# Patient Record
Sex: Female | Born: 1945
Health system: Southern US, Community
[De-identification: ages and names within clinical notes are randomized; demographics above are authoritative.]

## PROBLEM LIST (undated history)

## (undated) DIAGNOSIS — G459 Transient cerebral ischemic attack, unspecified: Secondary | ICD-10-CM

## (undated) DIAGNOSIS — F3289 Other specified depressive episodes: Secondary | ICD-10-CM

## (undated) DIAGNOSIS — M545 Low back pain, unspecified: Secondary | ICD-10-CM

## (undated) DIAGNOSIS — H269 Unspecified cataract: Secondary | ICD-10-CM

## (undated) DIAGNOSIS — I2510 Atherosclerotic heart disease of native coronary artery without angina pectoris: Secondary | ICD-10-CM

## (undated) DIAGNOSIS — I639 Cerebral infarction, unspecified: Secondary | ICD-10-CM

## (undated) DIAGNOSIS — Z5189 Encounter for other specified aftercare: Secondary | ICD-10-CM

## (undated) DIAGNOSIS — I6529 Occlusion and stenosis of unspecified carotid artery: Secondary | ICD-10-CM

## (undated) DIAGNOSIS — D649 Anemia, unspecified: Secondary | ICD-10-CM

## (undated) DIAGNOSIS — K219 Gastro-esophageal reflux disease without esophagitis: Secondary | ICD-10-CM

## (undated) DIAGNOSIS — M8580 Other specified disorders of bone density and structure, unspecified site: Secondary | ICD-10-CM

## (undated) DIAGNOSIS — H9190 Unspecified hearing loss, unspecified ear: Secondary | ICD-10-CM

## (undated) DIAGNOSIS — R609 Edema, unspecified: Secondary | ICD-10-CM

## (undated) DIAGNOSIS — E669 Obesity, unspecified: Secondary | ICD-10-CM

## (undated) DIAGNOSIS — I739 Peripheral vascular disease, unspecified: Secondary | ICD-10-CM

## (undated) DIAGNOSIS — M1990 Unspecified osteoarthritis, unspecified site: Secondary | ICD-10-CM

## (undated) DIAGNOSIS — R011 Cardiac murmur, unspecified: Secondary | ICD-10-CM

## (undated) DIAGNOSIS — R51 Headache: Secondary | ICD-10-CM

## (undated) DIAGNOSIS — K3189 Other diseases of stomach and duodenum: Secondary | ICD-10-CM

## (undated) DIAGNOSIS — M797 Fibromyalgia: Secondary | ICD-10-CM

## (undated) DIAGNOSIS — N183 Chronic kidney disease, stage 3 unspecified: Secondary | ICD-10-CM

## (undated) DIAGNOSIS — A609 Anogenital herpesviral infection, unspecified: Secondary | ICD-10-CM

## (undated) DIAGNOSIS — R9439 Abnormal result of other cardiovascular function study: Secondary | ICD-10-CM

## (undated) DIAGNOSIS — K317 Polyp of stomach and duodenum: Secondary | ICD-10-CM

## (undated) DIAGNOSIS — F329 Major depressive disorder, single episode, unspecified: Secondary | ICD-10-CM

## (undated) DIAGNOSIS — T7840XA Allergy, unspecified, initial encounter: Secondary | ICD-10-CM

## (undated) DIAGNOSIS — I10 Essential (primary) hypertension: Secondary | ICD-10-CM

## (undated) DIAGNOSIS — E785 Hyperlipidemia, unspecified: Secondary | ICD-10-CM

## (undated) DIAGNOSIS — E119 Type 2 diabetes mellitus without complications: Secondary | ICD-10-CM

## (undated) HISTORY — DX: Chronic kidney disease, stage 3 unspecified: N18.30

## (undated) HISTORY — DX: Gastro-esophageal reflux disease without esophagitis: K21.9

## (undated) HISTORY — PX: CORONARY STENT PLACEMENT: SHX1402

## (undated) HISTORY — DX: Encounter for other specified aftercare: Z51.89

## (undated) HISTORY — DX: Other specified depressive episodes: F32.89

## (undated) HISTORY — DX: Edema, unspecified: R60.9

## (undated) HISTORY — DX: Polyp of stomach and duodenum: K31.7

## (undated) HISTORY — DX: Major depressive disorder, single episode, unspecified: F32.9

## (undated) HISTORY — DX: Unspecified osteoarthritis, unspecified site: M19.90

## (undated) HISTORY — PX: CARDIAC CATHETERIZATION: SHX172

## (undated) HISTORY — PX: CARPAL TUNNEL RELEASE: SHX101

## (undated) HISTORY — DX: Cardiac murmur, unspecified: R01.1

## (undated) HISTORY — DX: Other diseases of stomach and duodenum: K31.89

## (undated) HISTORY — DX: Anemia, unspecified: D64.9

## (undated) HISTORY — DX: Obesity, unspecified: E66.9

## (undated) HISTORY — DX: Atherosclerotic heart disease of native coronary artery without angina pectoris: I25.10

## (undated) HISTORY — DX: Hyperlipidemia, unspecified: E78.5

## (undated) HISTORY — DX: Low back pain: M54.5

## (undated) HISTORY — DX: Peripheral vascular disease, unspecified: I73.9

## (undated) HISTORY — DX: Type 2 diabetes mellitus without complications: E11.9

## (undated) HISTORY — DX: Unspecified cataract: H26.9

## (undated) HISTORY — DX: Allergy, unspecified, initial encounter: T78.40XA

## (undated) HISTORY — DX: Transient cerebral ischemic attack, unspecified: G45.9

## (undated) HISTORY — PX: POLYPECTOMY: SHX149

## (undated) HISTORY — DX: Low back pain, unspecified: M54.50

## (undated) HISTORY — DX: Occlusion and stenosis of unspecified carotid artery: I65.29

## (undated) HISTORY — DX: Unspecified hearing loss, unspecified ear: H91.90

## (undated) HISTORY — PX: ABDOMINAL HYSTERECTOMY: SHX81

## (undated) HISTORY — DX: Cerebral infarction, unspecified: I63.9

## (undated) HISTORY — DX: Anogenital herpesviral infection, unspecified: A60.9

## (undated) HISTORY — DX: Other specified disorders of bone density and structure, unspecified site: M85.80

## (undated) HISTORY — PX: DILATION AND CURETTAGE OF UTERUS: SHX78

## (undated) HISTORY — PX: GLAUCOMA SURGERY: SHX656

## (undated) HISTORY — DX: Essential (primary) hypertension: I10

## (undated) HISTORY — DX: Headache: R51

## (undated) HISTORY — PX: CHOLECYSTECTOMY: SHX55

---

## 1998-09-21 ENCOUNTER — Emergency Department (HOSPITAL_COMMUNITY): Admission: EM | Admit: 1998-09-21 | Discharge: 1998-09-21 | Payer: Self-pay | Admitting: Emergency Medicine

## 1999-09-28 ENCOUNTER — Emergency Department (HOSPITAL_COMMUNITY): Admission: EM | Admit: 1999-09-28 | Discharge: 1999-09-28 | Payer: Self-pay | Admitting: Emergency Medicine

## 1999-10-16 ENCOUNTER — Other Ambulatory Visit: Admission: RE | Admit: 1999-10-16 | Discharge: 1999-10-16 | Payer: Self-pay | Admitting: Obstetrics and Gynecology

## 1999-10-30 ENCOUNTER — Other Ambulatory Visit: Admission: RE | Admit: 1999-10-30 | Discharge: 1999-10-30 | Payer: Self-pay | Admitting: Otolaryngology

## 1999-12-28 ENCOUNTER — Other Ambulatory Visit: Admission: RE | Admit: 1999-12-28 | Discharge: 1999-12-28 | Payer: Self-pay | Admitting: Gastroenterology

## 2000-02-13 ENCOUNTER — Encounter: Admission: RE | Admit: 2000-02-13 | Discharge: 2000-05-13 | Payer: Self-pay | Admitting: Endocrinology

## 2000-02-26 ENCOUNTER — Other Ambulatory Visit: Admission: RE | Admit: 2000-02-26 | Discharge: 2000-02-26 | Payer: Self-pay

## 2000-02-26 ENCOUNTER — Encounter (INDEPENDENT_AMBULATORY_CARE_PROVIDER_SITE_OTHER): Payer: Self-pay | Admitting: Specialist

## 2002-11-30 ENCOUNTER — Emergency Department (HOSPITAL_COMMUNITY): Admission: EM | Admit: 2002-11-30 | Discharge: 2002-11-30 | Payer: Self-pay | Admitting: Emergency Medicine

## 2002-11-30 ENCOUNTER — Encounter: Payer: Self-pay | Admitting: Emergency Medicine

## 2002-12-02 ENCOUNTER — Encounter: Payer: Self-pay | Admitting: Emergency Medicine

## 2002-12-02 ENCOUNTER — Ambulatory Visit (HOSPITAL_COMMUNITY): Admission: RE | Admit: 2002-12-02 | Discharge: 2002-12-02 | Payer: Self-pay | Admitting: Emergency Medicine

## 2003-02-23 ENCOUNTER — Observation Stay (HOSPITAL_COMMUNITY): Admission: RE | Admit: 2003-02-23 | Discharge: 2003-02-24 | Payer: Self-pay | Admitting: General Surgery

## 2003-02-23 ENCOUNTER — Encounter: Payer: Self-pay | Admitting: General Surgery

## 2003-02-23 ENCOUNTER — Encounter (INDEPENDENT_AMBULATORY_CARE_PROVIDER_SITE_OTHER): Payer: Self-pay

## 2003-12-06 ENCOUNTER — Emergency Department (HOSPITAL_COMMUNITY): Admission: EM | Admit: 2003-12-06 | Discharge: 2003-12-06 | Payer: Self-pay | Admitting: Emergency Medicine

## 2004-04-10 ENCOUNTER — Emergency Department (HOSPITAL_COMMUNITY): Admission: EM | Admit: 2004-04-10 | Discharge: 2004-04-10 | Payer: Self-pay | Admitting: Family Medicine

## 2004-05-19 ENCOUNTER — Other Ambulatory Visit: Admission: RE | Admit: 2004-05-19 | Discharge: 2004-05-19 | Payer: Self-pay | Admitting: Obstetrics and Gynecology

## 2004-08-29 ENCOUNTER — Encounter: Admission: RE | Admit: 2004-08-29 | Discharge: 2004-08-29 | Payer: Self-pay | Admitting: Obstetrics and Gynecology

## 2004-12-16 ENCOUNTER — Ambulatory Visit: Payer: Self-pay | Admitting: Family Medicine

## 2005-06-26 ENCOUNTER — Ambulatory Visit: Payer: Self-pay | Admitting: Family Medicine

## 2005-07-12 ENCOUNTER — Encounter: Admission: RE | Admit: 2005-07-12 | Discharge: 2005-07-12 | Payer: Self-pay | Admitting: Endocrinology

## 2005-08-28 ENCOUNTER — Ambulatory Visit: Payer: Self-pay | Admitting: Internal Medicine

## 2005-09-10 ENCOUNTER — Ambulatory Visit: Payer: Self-pay | Admitting: Cardiology

## 2005-09-19 ENCOUNTER — Ambulatory Visit: Payer: Self-pay | Admitting: Family Medicine

## 2005-09-28 ENCOUNTER — Ambulatory Visit: Payer: Self-pay

## 2005-10-03 ENCOUNTER — Encounter: Admission: RE | Admit: 2005-10-03 | Discharge: 2005-10-03 | Payer: Self-pay | Admitting: Orthopedic Surgery

## 2005-10-24 ENCOUNTER — Encounter: Admission: RE | Admit: 2005-10-24 | Discharge: 2005-10-24 | Payer: Self-pay | Admitting: Orthopedic Surgery

## 2005-12-06 ENCOUNTER — Ambulatory Visit: Payer: Self-pay | Admitting: Family Medicine

## 2005-12-28 ENCOUNTER — Ambulatory Visit: Payer: Self-pay | Admitting: Internal Medicine

## 2006-01-27 ENCOUNTER — Emergency Department (HOSPITAL_COMMUNITY): Admission: AD | Admit: 2006-01-27 | Discharge: 2006-01-27 | Payer: Self-pay | Admitting: Family Medicine

## 2006-02-01 ENCOUNTER — Ambulatory Visit: Payer: Self-pay | Admitting: Family Medicine

## 2006-02-08 ENCOUNTER — Encounter (INDEPENDENT_AMBULATORY_CARE_PROVIDER_SITE_OTHER): Payer: Self-pay | Admitting: *Deleted

## 2006-02-08 ENCOUNTER — Ambulatory Visit: Payer: Self-pay | Admitting: Internal Medicine

## 2006-02-08 HISTORY — PX: ESOPHAGOGASTRODUODENOSCOPY: SHX1529

## 2006-02-22 ENCOUNTER — Ambulatory Visit: Payer: Self-pay | Admitting: Family Medicine

## 2006-03-21 ENCOUNTER — Encounter: Admission: RE | Admit: 2006-03-21 | Discharge: 2006-04-18 | Payer: Self-pay | Admitting: Family Medicine

## 2006-04-10 ENCOUNTER — Encounter: Admission: RE | Admit: 2006-04-10 | Discharge: 2006-04-10 | Payer: Self-pay | Admitting: Neurosurgery

## 2006-05-08 ENCOUNTER — Encounter: Admission: RE | Admit: 2006-05-08 | Discharge: 2006-06-17 | Payer: Self-pay | Admitting: Neurosurgery

## 2006-06-24 ENCOUNTER — Ambulatory Visit: Admission: RE | Admit: 2006-06-24 | Discharge: 2006-06-24 | Payer: Self-pay | Admitting: Neurosurgery

## 2006-07-09 HISTORY — PX: LUMBAR FUSION: SHX111

## 2006-07-09 HISTORY — PX: BACK SURGERY: SHX140

## 2006-07-16 ENCOUNTER — Encounter: Payer: Self-pay | Admitting: Family Medicine

## 2006-07-31 ENCOUNTER — Inpatient Hospital Stay (HOSPITAL_COMMUNITY): Admission: RE | Admit: 2006-07-31 | Discharge: 2006-08-05 | Payer: Self-pay | Admitting: Neurosurgery

## 2006-09-26 ENCOUNTER — Ambulatory Visit: Payer: Self-pay | Admitting: Family Medicine

## 2006-09-30 ENCOUNTER — Ambulatory Visit: Payer: Self-pay | Admitting: Vascular Surgery

## 2007-02-06 ENCOUNTER — Encounter: Payer: Self-pay | Admitting: Family Medicine

## 2007-02-21 DIAGNOSIS — I10 Essential (primary) hypertension: Secondary | ICD-10-CM

## 2007-02-21 DIAGNOSIS — E785 Hyperlipidemia, unspecified: Secondary | ICD-10-CM | POA: Insufficient documentation

## 2007-02-21 DIAGNOSIS — J45909 Unspecified asthma, uncomplicated: Secondary | ICD-10-CM | POA: Insufficient documentation

## 2007-02-21 DIAGNOSIS — K219 Gastro-esophageal reflux disease without esophagitis: Secondary | ICD-10-CM

## 2007-02-21 DIAGNOSIS — I1A0 Resistant hypertension: Secondary | ICD-10-CM

## 2007-02-21 DIAGNOSIS — M545 Low back pain: Secondary | ICD-10-CM | POA: Insufficient documentation

## 2007-02-21 HISTORY — DX: Essential (primary) hypertension: I10

## 2007-02-21 HISTORY — DX: Resistant hypertension: I1A.0

## 2007-02-26 ENCOUNTER — Encounter: Payer: Self-pay | Admitting: Family Medicine

## 2007-03-06 ENCOUNTER — Ambulatory Visit (HOSPITAL_COMMUNITY): Admission: RE | Admit: 2007-03-06 | Discharge: 2007-03-06 | Payer: Self-pay | Admitting: Neurosurgery

## 2007-05-27 ENCOUNTER — Encounter: Payer: Self-pay | Admitting: Family Medicine

## 2007-06-24 ENCOUNTER — Ambulatory Visit: Payer: Self-pay | Admitting: Family Medicine

## 2007-07-16 ENCOUNTER — Ambulatory Visit: Payer: Self-pay | Admitting: Family Medicine

## 2007-07-16 DIAGNOSIS — F329 Major depressive disorder, single episode, unspecified: Secondary | ICD-10-CM

## 2007-07-16 DIAGNOSIS — F32A Depression, unspecified: Secondary | ICD-10-CM | POA: Insufficient documentation

## 2007-07-16 DIAGNOSIS — E118 Type 2 diabetes mellitus with unspecified complications: Secondary | ICD-10-CM | POA: Insufficient documentation

## 2007-10-15 ENCOUNTER — Encounter: Payer: Self-pay | Admitting: Family Medicine

## 2007-10-31 ENCOUNTER — Encounter: Payer: Self-pay | Admitting: Family Medicine

## 2007-12-30 ENCOUNTER — Ambulatory Visit: Payer: Self-pay | Admitting: Cardiovascular Disease

## 2007-12-30 ENCOUNTER — Telehealth: Payer: Self-pay | Admitting: Family Medicine

## 2007-12-30 ENCOUNTER — Ambulatory Visit: Payer: Self-pay | Admitting: Internal Medicine

## 2007-12-30 ENCOUNTER — Inpatient Hospital Stay (HOSPITAL_COMMUNITY): Admission: EM | Admit: 2007-12-30 | Discharge: 2008-01-02 | Payer: Self-pay | Admitting: Emergency Medicine

## 2007-12-30 ENCOUNTER — Encounter: Payer: Self-pay | Admitting: Internal Medicine

## 2007-12-31 ENCOUNTER — Encounter: Payer: Self-pay | Admitting: Internal Medicine

## 2008-01-01 ENCOUNTER — Ambulatory Visit: Payer: Self-pay | Admitting: Vascular Surgery

## 2008-01-06 ENCOUNTER — Ambulatory Visit (HOSPITAL_COMMUNITY): Admission: RE | Admit: 2008-01-06 | Discharge: 2008-01-06 | Payer: Self-pay | Admitting: Surgery

## 2008-01-07 ENCOUNTER — Ambulatory Visit: Payer: Self-pay | Admitting: Family Medicine

## 2008-01-29 ENCOUNTER — Telehealth: Payer: Self-pay | Admitting: Family Medicine

## 2008-02-24 ENCOUNTER — Telehealth: Payer: Self-pay | Admitting: Family Medicine

## 2008-02-27 ENCOUNTER — Ambulatory Visit: Payer: Self-pay | Admitting: Family Medicine

## 2008-02-27 DIAGNOSIS — R609 Edema, unspecified: Secondary | ICD-10-CM

## 2008-04-19 ENCOUNTER — Telehealth: Payer: Self-pay | Admitting: Family Medicine

## 2008-04-20 ENCOUNTER — Ambulatory Visit: Payer: Self-pay | Admitting: Family Medicine

## 2008-07-07 ENCOUNTER — Ambulatory Visit: Payer: Self-pay | Admitting: Vascular Surgery

## 2008-07-21 ENCOUNTER — Ambulatory Visit: Payer: Self-pay | Admitting: Family Medicine

## 2008-07-21 DIAGNOSIS — R51 Headache: Secondary | ICD-10-CM

## 2008-07-21 DIAGNOSIS — R519 Headache, unspecified: Secondary | ICD-10-CM | POA: Insufficient documentation

## 2008-07-21 DIAGNOSIS — D649 Anemia, unspecified: Secondary | ICD-10-CM | POA: Insufficient documentation

## 2008-07-21 DIAGNOSIS — Z8673 Personal history of transient ischemic attack (TIA), and cerebral infarction without residual deficits: Secondary | ICD-10-CM

## 2008-07-21 DIAGNOSIS — J019 Acute sinusitis, unspecified: Secondary | ICD-10-CM

## 2008-07-22 ENCOUNTER — Telehealth: Payer: Self-pay | Admitting: Family Medicine

## 2008-08-05 ENCOUNTER — Telehealth: Payer: Self-pay | Admitting: Family Medicine

## 2008-12-08 ENCOUNTER — Ambulatory Visit: Payer: Self-pay | Admitting: Vascular Surgery

## 2008-12-12 ENCOUNTER — Inpatient Hospital Stay (HOSPITAL_COMMUNITY): Admission: EM | Admit: 2008-12-12 | Discharge: 2008-12-16 | Payer: Self-pay | Admitting: Emergency Medicine

## 2008-12-12 ENCOUNTER — Encounter: Payer: Self-pay | Admitting: Family Medicine

## 2008-12-12 ENCOUNTER — Ambulatory Visit: Payer: Self-pay | Admitting: Cardiology

## 2008-12-12 ENCOUNTER — Ambulatory Visit: Payer: Self-pay | Admitting: Internal Medicine

## 2008-12-13 ENCOUNTER — Encounter: Payer: Self-pay | Admitting: Cardiology

## 2008-12-15 ENCOUNTER — Encounter: Payer: Self-pay | Admitting: Cardiovascular Disease

## 2008-12-22 ENCOUNTER — Telehealth: Payer: Self-pay | Admitting: Cardiovascular Disease

## 2008-12-23 ENCOUNTER — Encounter: Payer: Self-pay | Admitting: Nurse Practitioner

## 2008-12-23 ENCOUNTER — Ambulatory Visit: Payer: Self-pay | Admitting: Internal Medicine

## 2008-12-23 DIAGNOSIS — I2510 Atherosclerotic heart disease of native coronary artery without angina pectoris: Secondary | ICD-10-CM | POA: Insufficient documentation

## 2008-12-23 LAB — CONVERTED CEMR LAB
ALT: 38 units/L — ABNORMAL HIGH (ref 0–35)
Albumin: 3.6 g/dL (ref 3.5–5.2)
BUN: 13 mg/dL (ref 6–23)
Bilirubin, Direct: 0.2 mg/dL (ref 0.0–0.3)
CO2: 30 meq/L (ref 19–32)
Calcium: 8.9 mg/dL (ref 8.4–10.5)
Chloride: 104 meq/L (ref 96–112)
Creatinine, Ser: 0.7 mg/dL (ref 0.4–1.2)
Total CK: 55 units/L (ref 7–177)
Total Protein: 6.7 g/dL (ref 6.0–8.3)

## 2008-12-28 ENCOUNTER — Encounter: Payer: Self-pay | Admitting: Family Medicine

## 2008-12-30 ENCOUNTER — Encounter: Payer: Self-pay | Admitting: Cardiovascular Disease

## 2008-12-30 ENCOUNTER — Encounter (HOSPITAL_COMMUNITY): Admission: RE | Admit: 2008-12-30 | Discharge: 2009-03-30 | Payer: Self-pay | Admitting: Cardiovascular Disease

## 2009-01-04 ENCOUNTER — Encounter: Payer: Self-pay | Admitting: Family Medicine

## 2009-01-05 ENCOUNTER — Encounter: Payer: Self-pay | Admitting: Cardiovascular Disease

## 2009-01-12 ENCOUNTER — Encounter: Payer: Self-pay | Admitting: Family Medicine

## 2009-01-17 ENCOUNTER — Encounter: Payer: Self-pay | Admitting: Cardiovascular Disease

## 2009-01-19 ENCOUNTER — Ambulatory Visit: Payer: Self-pay | Admitting: Internal Medicine

## 2009-01-24 ENCOUNTER — Telehealth: Payer: Self-pay | Admitting: Cardiovascular Disease

## 2009-01-31 ENCOUNTER — Ambulatory Visit: Payer: Self-pay | Admitting: Cardiology

## 2009-01-31 ENCOUNTER — Ambulatory Visit: Payer: Self-pay | Admitting: Cardiovascular Disease

## 2009-01-31 DIAGNOSIS — I951 Orthostatic hypotension: Secondary | ICD-10-CM | POA: Insufficient documentation

## 2009-02-01 ENCOUNTER — Encounter: Payer: Self-pay | Admitting: Family Medicine

## 2009-02-04 ENCOUNTER — Ambulatory Visit: Payer: Self-pay | Admitting: Cardiology

## 2009-02-09 ENCOUNTER — Encounter: Payer: Self-pay | Admitting: Family Medicine

## 2009-03-16 ENCOUNTER — Telehealth: Payer: Self-pay | Admitting: Cardiovascular Disease

## 2009-03-21 ENCOUNTER — Ambulatory Visit: Payer: Self-pay | Admitting: Cardiovascular Disease

## 2009-03-21 DIAGNOSIS — I10 Essential (primary) hypertension: Secondary | ICD-10-CM | POA: Insufficient documentation

## 2009-03-30 ENCOUNTER — Telehealth: Payer: Self-pay | Admitting: Cardiovascular Disease

## 2009-03-31 ENCOUNTER — Encounter (HOSPITAL_COMMUNITY): Admission: RE | Admit: 2009-03-31 | Discharge: 2009-06-07 | Payer: Self-pay | Admitting: Cardiovascular Disease

## 2009-04-12 ENCOUNTER — Ambulatory Visit: Payer: Self-pay | Admitting: Family Medicine

## 2009-04-22 ENCOUNTER — Telehealth: Payer: Self-pay | Admitting: Cardiovascular Disease

## 2009-05-24 ENCOUNTER — Encounter: Payer: Self-pay | Admitting: Cardiovascular Disease

## 2009-05-26 ENCOUNTER — Encounter: Payer: Self-pay | Admitting: Cardiovascular Disease

## 2009-05-26 ENCOUNTER — Encounter: Payer: Self-pay | Admitting: Family Medicine

## 2009-06-08 ENCOUNTER — Ambulatory Visit: Payer: Self-pay | Admitting: Vascular Surgery

## 2009-06-10 ENCOUNTER — Ambulatory Visit: Payer: Self-pay | Admitting: Cardiovascular Disease

## 2009-06-10 DIAGNOSIS — R0602 Shortness of breath: Secondary | ICD-10-CM

## 2009-06-20 ENCOUNTER — Telehealth: Payer: Self-pay | Admitting: Cardiovascular Disease

## 2009-06-22 ENCOUNTER — Telehealth (INDEPENDENT_AMBULATORY_CARE_PROVIDER_SITE_OTHER): Payer: Self-pay | Admitting: *Deleted

## 2009-07-04 ENCOUNTER — Telehealth (INDEPENDENT_AMBULATORY_CARE_PROVIDER_SITE_OTHER): Payer: Self-pay | Admitting: *Deleted

## 2009-07-06 ENCOUNTER — Telehealth: Payer: Self-pay | Admitting: Cardiovascular Disease

## 2009-07-21 ENCOUNTER — Telehealth (INDEPENDENT_AMBULATORY_CARE_PROVIDER_SITE_OTHER): Payer: Self-pay | Admitting: *Deleted

## 2009-07-22 ENCOUNTER — Encounter: Payer: Self-pay | Admitting: Cardiovascular Disease

## 2009-07-22 ENCOUNTER — Encounter: Payer: Self-pay | Admitting: Family Medicine

## 2009-07-25 ENCOUNTER — Ambulatory Visit: Payer: Self-pay

## 2009-07-25 ENCOUNTER — Encounter (HOSPITAL_COMMUNITY): Admission: RE | Admit: 2009-07-25 | Discharge: 2009-10-14 | Payer: Self-pay | Admitting: Obstetrics and Gynecology

## 2009-07-25 ENCOUNTER — Ambulatory Visit: Payer: Self-pay | Admitting: Cardiology

## 2009-08-02 ENCOUNTER — Ambulatory Visit: Payer: Self-pay | Admitting: Cardiovascular Disease

## 2009-08-02 ENCOUNTER — Encounter: Payer: Self-pay | Admitting: Family Medicine

## 2009-08-17 ENCOUNTER — Telehealth: Payer: Self-pay | Admitting: Cardiovascular Disease

## 2009-08-19 ENCOUNTER — Ambulatory Visit: Payer: Self-pay | Admitting: Cardiovascular Disease

## 2009-08-19 ENCOUNTER — Encounter: Payer: Self-pay | Admitting: Internal Medicine

## 2009-09-01 LAB — CONVERTED CEMR LAB
Calcium: 9.7 mg/dL (ref 8.4–10.5)
Potassium: 3.5 meq/L (ref 3.5–5.3)
Sodium: 144 meq/L (ref 135–145)

## 2009-09-29 ENCOUNTER — Encounter: Payer: Self-pay | Admitting: Family Medicine

## 2009-10-02 ENCOUNTER — Emergency Department (HOSPITAL_COMMUNITY): Admission: EM | Admit: 2009-10-02 | Discharge: 2009-10-03 | Payer: Self-pay | Admitting: Emergency Medicine

## 2009-10-02 ENCOUNTER — Emergency Department (HOSPITAL_COMMUNITY): Admission: EM | Admit: 2009-10-02 | Discharge: 2009-10-02 | Payer: Self-pay | Admitting: Family Medicine

## 2009-10-04 ENCOUNTER — Encounter: Admission: RE | Admit: 2009-10-04 | Discharge: 2009-10-04 | Payer: Self-pay | Admitting: Neurosurgery

## 2009-10-10 ENCOUNTER — Encounter: Payer: Self-pay | Admitting: Family Medicine

## 2009-10-24 ENCOUNTER — Telehealth: Payer: Self-pay | Admitting: Cardiovascular Disease

## 2009-11-10 ENCOUNTER — Encounter: Payer: Self-pay | Admitting: Family Medicine

## 2009-11-21 ENCOUNTER — Telehealth: Payer: Self-pay | Admitting: Cardiovascular Disease

## 2009-11-30 ENCOUNTER — Encounter: Payer: Self-pay | Admitting: Family Medicine

## 2009-12-09 ENCOUNTER — Ambulatory Visit: Payer: Self-pay | Admitting: Family Medicine

## 2009-12-09 DIAGNOSIS — R079 Chest pain, unspecified: Secondary | ICD-10-CM

## 2009-12-27 ENCOUNTER — Telehealth: Payer: Self-pay | Admitting: Cardiovascular Disease

## 2009-12-28 ENCOUNTER — Ambulatory Visit: Payer: Self-pay | Admitting: Vascular Surgery

## 2009-12-28 ENCOUNTER — Encounter: Payer: Self-pay | Admitting: Family Medicine

## 2010-01-30 ENCOUNTER — Encounter: Payer: Self-pay | Admitting: Family Medicine

## 2010-02-07 ENCOUNTER — Encounter: Admission: RE | Admit: 2010-02-07 | Discharge: 2010-02-07 | Payer: Self-pay | Admitting: Neurosurgery

## 2010-02-20 ENCOUNTER — Ambulatory Visit: Payer: Self-pay | Admitting: Cardiovascular Disease

## 2010-02-23 ENCOUNTER — Telehealth: Payer: Self-pay | Admitting: Cardiovascular Disease

## 2010-02-23 ENCOUNTER — Encounter: Payer: Self-pay | Admitting: Family Medicine

## 2010-04-18 ENCOUNTER — Encounter: Payer: Self-pay | Admitting: Cardiovascular Disease

## 2010-05-18 ENCOUNTER — Encounter: Payer: Self-pay | Admitting: Family Medicine

## 2010-05-20 ENCOUNTER — Encounter: Payer: Self-pay | Admitting: Family Medicine

## 2010-05-23 ENCOUNTER — Encounter: Payer: Self-pay | Admitting: Family Medicine

## 2010-05-30 ENCOUNTER — Encounter: Payer: Self-pay | Admitting: Family Medicine

## 2010-06-08 ENCOUNTER — Ambulatory Visit: Payer: Self-pay

## 2010-06-08 ENCOUNTER — Encounter: Payer: Self-pay | Admitting: Cardiovascular Disease

## 2010-06-08 ENCOUNTER — Telehealth: Payer: Self-pay | Admitting: Cardiovascular Disease

## 2010-06-14 ENCOUNTER — Telehealth: Payer: Self-pay | Admitting: Cardiovascular Disease

## 2010-06-16 ENCOUNTER — Telehealth (INDEPENDENT_AMBULATORY_CARE_PROVIDER_SITE_OTHER): Payer: Self-pay | Admitting: *Deleted

## 2010-06-19 ENCOUNTER — Encounter: Payer: Self-pay | Admitting: Cardiovascular Disease

## 2010-06-29 ENCOUNTER — Ambulatory Visit: Payer: Self-pay | Admitting: Vascular Surgery

## 2010-07-05 ENCOUNTER — Telehealth: Payer: Self-pay | Admitting: Cardiovascular Disease

## 2010-08-01 ENCOUNTER — Encounter: Payer: Self-pay | Admitting: Family Medicine

## 2010-08-06 LAB — CONVERTED CEMR LAB
AST: 30 units/L (ref 0–37)
Albumin: 3.7 g/dL (ref 3.5–5.2)
Alkaline Phosphatase: 86 units/L (ref 39–117)
BUN: 9 mg/dL (ref 6–23)
Basophils Relative: 0.1 % (ref 0.0–1.0)
CO2: 30 meq/L (ref 19–32)
Chloride: 103 meq/L (ref 96–112)
Creatinine, Ser: 0.7 mg/dL (ref 0.4–1.2)
HCT: 38.1 % (ref 36.0–46.0)
Hemoglobin: 12.6 g/dL (ref 12.0–15.0)
LDL Cholesterol: 116 mg/dL — ABNORMAL HIGH (ref 0–99)
Monocytes Absolute: 0.5 10*3/uL (ref 0.2–0.7)
Neutrophils Relative %: 60.2 % (ref 43.0–77.0)
Potassium: 4.1 meq/L (ref 3.5–5.1)
RBC: 4.52 M/uL (ref 3.87–5.11)
RDW: 13.3 % (ref 11.5–14.6)
Sodium: 140 meq/L (ref 135–145)
TSH: 2.51 microintl units/mL (ref 0.35–5.50)
Total Bilirubin: 0.7 mg/dL (ref 0.3–1.2)
Total CHOL/HDL Ratio: 4.6
Total Protein: 6.4 g/dL (ref 6.0–8.3)
VLDL: 28 mg/dL (ref 0–40)
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
## 2010-08-08 NOTE — Letter (Signed)
Summary: [facility]  [facility]   Imported By: Maryln Gottron 06/08/2010 12:57:06  _____________________________________________________________________  External Attachment:    Type:   Image     Comment:   External Document

## 2010-08-08 NOTE — Letter (Signed)
Summary: [facility]  [facility]   Imported By: Maryln Gottron 12/12/2009 10:03:56  _____________________________________________________________________  External Attachment:    Type:   Image     Comment:   External Document
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
## 2010-08-08 NOTE — Progress Notes (Signed)
Summary: Plavix  Phone Note Call from Patient Call back at Home Phone [phone] Call back at [phone]   Reason for Call: Talk to Nurse Summary of Call: Wants to know if med has come in yet.  Initial call taken by: Omar Person,  Nov 21, 2009 9:15 AM  Follow-up for Phone Call        I left a message on the pt's identified voicemail that her Plavix has arrived in the office and is at the front desk for pick-up. Follow-up by: Julieta Gutting, RN, BSN,  Nov 21, 2009 10:26 AM

## 2010-08-08 NOTE — Letter (Signed)
Summary: [facility]  [facility]   Imported By: Maryln Gottron 02/10/2010 13:19:30  _____________________________________________________________________  External Attachment:    Type:   Image     Comment:   External Document

## 2010-08-08 NOTE — Letter (Signed)
Summary: [facility] ENT Office Note  [facility] ENT Office Note   Imported By: Roderic Ovens 08/12/2009 15:43:40  _____________________________________________________________________  External Attachment:    Type:   Image     Comment:   External Document

## 2010-08-08 NOTE — Miscellaneous (Signed)
Summary: Orders Update  Clinical Lists Changes  Orders: Added new Test order of T-Basic Metabolic Panel ([id number]) - Signed  

## 2010-08-08 NOTE — Letter (Signed)
Summary: [facility]  [facility]   Imported By: Kassie Mends 06/20/2009 09:22:19  _____________________________________________________________________  External Attachment:    Type:   Image     Comment:   External Document

## 2010-08-08 NOTE — Progress Notes (Signed)
Summary: Need Plavix ordered  Phone Note Call from Patient Call back at Home Phone [phone]   Caller: Patient Summary of Call: Pt need Plavix ordered will be out in a week Initial call taken by: Judie Grieve,  February 23, 2010 2:44 PM  Follow-up for Phone Call        Medication ordered and will arrive in 7-10 business days.  Pt aware.  Follow-up by: Julieta Gutting, RN, BSN,  February 23, 2010 2:54 PM     Appended Document: Need Plavix ordered Medication placed at the front desk.  Message left for pt to pick-up.  Order #[id number],  PO #[phone].

## 2010-08-08 NOTE — Letter (Signed)
Summary: Appointments, Instructions and Medications/[facility], Instructions and Medications/[facility]   Imported By: Maryln Gottron 05/31/2010 12:52:00  _____________________________________________________________________  External Attachment:    Type:   Image     Comment:   External Document

## 2010-08-08 NOTE — Progress Notes (Signed)
Summary: Sharp chest pains this morning and last night  Phone Note Call from Patient Call back at [facility] Phone [phone]   Caller: Patient Summary of Call: sharp pains in chest this morning pt took Nitro this morning Initial call taken by: Judie Grieve,  June 08, 2010 2:29 PM  Follow-up for Phone Call        Sharp stabbing in chest for 1 second and then it quits. No SOB. Pt. did try taking nitroglycerine & it didn't stop. She has been having it for a few days and it comes & goes. Advised pt. to come into the office & we would do an EKG on her. She will come now. Whitney Maeola Sarah RN  June 08, 2010 2:35 PM  EKG showed NSR. BP 138/58 HR 62. Advised pt. that I would pass this information to Dr. Excell Seltzer and his RN. She understands. She was currently not having any CP when she came in for her EKG. Whitney Maeola Sarah RN  June 08, 2010 3:50 PM   Follow-up by: Whitney Maeola Sarah RN,  June 08, 2010 2:34 PM     Appended Document: Sharp chest pains this morning and last night- PT COMING FOR EKG Pain sounds highly atypical and not cardiac related. She has past hx of fleeting chest pains. Will call her in followup but this probably does not require further evaluation.  Appended Document: Sharp chest pains this morning and last night See 06/14/10 phone note.

## 2010-08-08 NOTE — Letter (Signed)
Summary: [facility]  [facility]   Imported By: Maryln Gottron 11/03/2009 13:37:35  _____________________________________________________________________  External Attachment:    Type:   Image     Comment:   External Document

## 2010-08-08 NOTE — Letter (Signed)
Summary: [facility]  [facility]   Imported By: Maryln Gottron 03/06/2010 14:18:21  _____________________________________________________________________  External Attachment:    Type:   Image     Comment:   External Document

## 2010-08-08 NOTE — Progress Notes (Signed)
  Walk in Patient Form Recieved " Pt lef [place] papers" sent to Message Nurse [facility]  June 16, 2010 10:32 AM

## 2010-08-08 NOTE — Letter (Signed)
Summary: [facility]   [facility]   Imported By: Roderic Ovens 05/10/2010 11:57:36  _____________________________________________________________________  External Attachment:    Type:   Image     Comment:   External Document

## 2010-08-08 NOTE — Consult Note (Signed)
Summary: [facility]  [facility]   Imported By: Maryln Gottron 07/28/2009 14:09:12  _____________________________________________________________________  External Attachment:    Type:   Image     Comment:   External Document

## 2010-08-08 NOTE — Progress Notes (Signed)
Summary: Nuclear Pre-Procedure  Phone Note Outgoing Call   Call placed by: Milana Na, EMT-P,  July 21, 2009 10:29 AM Summary of Call: Reviewed information on Myoview Information Sheet (see scanned document for further details).  Spoke with patient.     Nuclear Med Background Indications for Stress Test: Evaluation for Ischemia, Stent Patency   History: Asthma, Echo, Heart Catheterization, Stents  History Comments: 06/10 ECHO=EF 60-65%; 06/10 STENTS- RCA  Symptoms: Chest Pain, SOB    Nuclear Pre-Procedure Cardiac Risk Factors: Family History - CAD, Hypertension, IDDM Type 2, Lipids, TIA Height (in): 64  Nuclear Med Study Referring MD:  M.Cooper

## 2010-08-08 NOTE — Progress Notes (Signed)
Summary: meds ordered  Phone Note Call from Patient Call back at Home Phone [phone]   Caller: Patient Summary of Call: Pt needs her plavix ordered. Initial call taken by: Edman Circle,  October 24, 2009 12:26 PM  Follow-up for Phone Call        Plavix ordered from [facility].  Plavix will arrive in 7-10 business days. Pt aware.  Follow-up by: Julieta Gutting, RN, BSN,  October 24, 2009 1:01 PM     Appended Document: meds ordered Plavix arrived in the office and was placed at the front desk for pick-up.  I left a message for the pt that her Plavix has arrived and that our office is closed tomorrow.  Order #[id number], Customer  PO number [phone].

## 2010-08-08 NOTE — Progress Notes (Signed)
Summary: PLAVIX ORDER   Phone Note Call from Patient Call back at Home Phone [phone] Call back at [phone] Message from:  Patient on June 14, 2010 11:12 AM  Refills Requested: Medication #1:  PLAVIX 75 MG TABS Take one tablet by mouth daily Caller: Patient Summary of Call: PT NEEDS PLAVIX ORDER AND PT WOULD LIKE A CALL Initial call taken by: Judie Grieve,  June 14, 2010 11:13 AM  Follow-up for Phone Call        I contacted [employer] and the pt's application has expired.  I spoke with the pt and made her aware that she will need to complete a new application.  This was placed at the front desk for the pt to pick-up and complete.  I also spoke with the pt about the symptoms she was having last week and made her aware that Dr Excell Seltzer reviewed her EKG and did not feel like this was cardiac related.  The pt said she is still having some discomfort in her breast and she is planning on contact her physician about a mammogram.  Follow-up by: Julieta Gutting, RN, BSN,  June 14, 2010 4:59 PM

## 2010-08-08 NOTE — Letter (Signed)
Summary: [facility]  [facility]   Imported By: Maryln Gottron 05/26/2010 10:46:27  _____________________________________________________________________  External Attachment:    Type:   Image     Comment:   External Document

## 2010-08-08 NOTE — Assessment & Plan Note (Signed)
Summary: EKG/wpa/786.50  Nurse Visit   Vital Signs:  Patient profile:   65 year old female Height:      64 inches Weight:      204 pounds Pulse rate:   62 / minute Resp:     14 per minute BP sitting:   138 / 58  (right arm) CC: Sharp CP. Pain Assessment Patient in pain? no      Comments Pt. having sharp CP lasting only about 1-2 seconds. No SOB. She came in for an EKG which showed NSR. She was currently not having any CP at the time. I will forward this information to Dr. Excell Seltzer. Patient advised to call us if the CP persists. She took one nitroglycerine that did not help the pain.   Allergies: No Known Drug Allergies

## 2010-08-08 NOTE — Op Note (Signed)
Summary: Epistaxis Embolization/[facility]  Epistaxis Embolization/[facility]   Imported By: Maryln Gottron 05/26/2010 10:44:51  _____________________________________________________________________  External Attachment:    Type:   Image     Comment:   External Document

## 2010-08-08 NOTE — Progress Notes (Signed)
Summary: calling regarding a medication  Phone Note Call from Patient Call back at Home Phone [phone]   Caller: Patient Summary of Call: Pt calling regarding Carvedilol is causing pt to have back  and hip pain pt want to know if the medication can be changed to something else pt has stopped taking this medication Initial call taken by: Judie Grieve,  December 27, 2009 4:10 PM  Follow-up for Phone Call        I spoke with the pt and she is c/o back and hip pain.  The pt said she ran out of Carvedilol 7 days ago and did not get this medication refilled.  Since she has been off of this medication she said her pain has eased off.  The pt would like to know if she can try a different medication in the place of Carvedilol.  I will forward this information to Dr Excell Seltzer to review.  Julieta Gutting, RN, BSN  December 27, 2009 6:24 PM    Additional Follow-up for Phone Call Additional follow up Details #1::        I doubt pain symptoms related to carvedilol, but can try metoprolol succinate 50 mg daily if pt desires a different beta blocker Additional Follow-up by: Norva Karvonen, MD,  December 29, 2009 4:04 PM    Additional Follow-up for Phone Call Additional follow up Details #2::    Left message to call back. Julieta Gutting, RN, BSN  December 29, 2009 4:17 PM  I left a message with the gentleman who answered the pt's phone to have the pt contact our office. Julieta Gutting, RN, BSN  January 02, 2010 3:04 PM  I spoke with the pt and made her aware that Dr Excell Seltzer recommended changing her to Metoprolol Succinate. Rx sent into the pharmacy.  Follow-up by: Julieta Gutting, RN, BSN,  January 03, 2010 3:18 PM  New/Updated Medications: METOPROLOL SUCCINATE 50 MG XR24H-TAB (METOPROLOL SUCCINATE) Take one tablet by mouth daily Prescriptions: METOPROLOL SUCCINATE 50 MG XR24H-TAB (METOPROLOL SUCCINATE) Take one tablet by mouth daily  #30 x 6   Entered by:   Julieta Gutting, RN, BSN   Authorized by:   Norva Karvonen, MD   Signed by:   Julieta Gutting, RN, BSN on 01/03/2010   Method used:   Electronically to        [facility] (retail)       [address]       [place], [place]  [address]       Ph: [phone]       Fax: [phone]   RxID:   [id number]

## 2010-08-08 NOTE — Letter (Signed)
Summary: [facility]  [facility]   Imported By: Maryln Gottron 05/29/2010 11:14:09  _____________________________________________________________________  External Attachment:    Type:   Image     Comment:   External Document

## 2010-08-08 NOTE — Assessment & Plan Note (Signed)
Summary: Cardiology Nuclear Study  Nuclear Med Background Indications for Stress Test: Evaluation for Ischemia, Stent Patency   History: Asthma, Echo, Heart Catheterization, Myocardial Perfusion Study, Stents  History Comments: 3-4 yrs ago MPS:OK per patient; 6/10 ECHO=EF 60-65%; 06/10 STENT-RCA  Symptoms: Chest Pain, SOB  Symptoms Comments: Last episode of CP:2 weeks ago, improved with Protonix.   Nuclear Pre-Procedure Cardiac Risk Factors: Carotid Disease, Family History - CAD, History of Smoking, Hypertension, IDDM Type 2, Lipids, Obesity, TIA Caffeine/Decaff Intake: None NPO After: 8:00 PM Lungs: Clear IV 0.9% NS with Angio Cath: 22g     IV Site: (R) AC IV Started by: Irean Hong RN Chest Size (in) 42     Cup Size C     Height (in): 64 Weight (lb): 210 BMI: 36.18  Nuclear Med Study 1 or 2 day study:  1 day     Stress Test Type:  Eugenie Birks Reading MD:  Marca Ancona, MD     Referring MD:  Tonny Bollman, MD Resting Radionuclide:  Technetium 6m Tetrofosmin     Resting Radionuclide Dose:  10.3 mCi  Stress Radionuclide:  Technetium 14m Tetrofosmin     Stress Radionuclide Dose:  33.0 mCi   Stress Protocol   Lexiscan: 0.4 mg   Stress Test Technologist:  Rea College CMA-N     Nuclear Technologist:  Burna Mortimer Deal RT-N  Rest Procedure  Myocardial perfusion imaging was performed at rest 45 minutes following the intravenous administration of Myoview Technetium 21m Tetrofosmin.  Stress Procedure  The patient received IV Lexiscan 0.4 mg over 15-seconds.  Myoview injected at 30-seconds.  There were no significant changes with infusion.  Quantitative spect images were obtained after a 45 minute delay.  QPS Raw Data Images:  Surface contamination with radioisotope on stress images.  Stress Images:  There is normal uptake in all areas. Rest Images:  Uniform and normal uptake of tracer in all myocardial segments. Subtraction (SDS):  There is no evidence of scar or  ischemia. Transient Ischemic Dilatation:  1.05  (Normal <1.22)  Lung/Heart Ratio:  .26  (Normal <0.45)  Quantitative Gated Spect Images QGS EDV:  91 ml QGS ESV:  33 ml QGS EF:  63 % QGS cine images:  Normal wall motion.    Overall Impression  Exercise Capacity: Lexiscan study.  BP Response: Normal blood pressure response. Clinical Symptoms: Short of breath ECG Impression: No significant ST segment change suggestive of ischemia. Overall Impression: Normal stress nuclear study.  Appended Document: Cardiology Nuclear Study reviewed. normal nuclear study. cont current med Rx.  Appended Document: Cardiology Nuclear Study Dr Excell Seltzer reviewed with the pt at 08/02/09 appt.

## 2010-08-08 NOTE — Letter (Signed)
Summary: [facility]  [facility]   Imported By: Maryln Gottron 10/14/2009 14:46:51  _____________________________________________________________________  External Attachment:    Type:   Image     Comment:   External Document

## 2010-08-08 NOTE — Assessment & Plan Note (Signed)
Summary: ROV   Visit Type:  Follow-up Primary Provider:  Nelwyn Salisbury MD  CC:  Feet edema -feet pain- Pt. thinks side effect from Losartan- Rt. foot getting dark.  History of Present Illness: This is a 65 year old woman with type 2 diabetes and coronary artery disease presenting today for followup evaluation. She underwent PCI of the right coronary artery with drug-eluting stent in 2010, after she presented with unstable angina. She has had some recurrent, nonexertional chest pains over the past several months. She underwent a stress Myoview scan last week showing normal perfusion throughout.  Her main complaint today is bilateral foot and lower leg swelling and pain. She does not restrict salt. Reports some improvement in her symptoms since switching her ARB back to Avapro. No other complaints at present.  Dyspepsia History:      There is a prior history of GERD.  A prior EGD has been done.     Current Medications (verified): 1)  Lantus 100 Unit/ml  Soln (Insulin Glargine) .... 23 Units Two Times A Day Needs Ov 2)  Aspirin Ec 325 Mg Tbec (Aspirin) .... Take One Tablet By Mouth Daily 3)  Plavix 75 Mg Tabs (Clopidogrel Bisulfate) .... Take One Tablet By Mouth Daily 4)  Imdur 30 Mg Xr24h-Tab (Isosorbide Mononitrate) .Marland Kitchen.. 1 Tab By Mouth Once Daily 5)  Hydralazine Hcl 50 Mg Tabs (Hydralazine Hcl) .... Take One Tablet By Mouth Three Times A Day 6)  Crestor 40 Mg Tabs (Rosuvastatin Calcium) .... Take One Tablet By Mouth Daily.  Pt Will Continue Until Med Runs Out-Then Pt Will Take Lipitor 40mg  Qd 7)  Carvedilol 25 Mg Tabs (Carvedilol) .... Take One Tablet By Mouth Twice A Day 8)  Nitrostat 0.4 Mg Subl (Nitroglycerin) .... As Directed 9)  Furosemide 20 Mg Tabs (Furosemide) .... Take One Tablet By Mouth Daily. 10)  Potassium Chloride Cr 10 Meq Cr-Caps (Potassium Chloride) .... Take One Tablet By Mouth Daily 11)  Pantoprazole Sodium 40 Mg Tbec (Pantoprazole Sodium) .... Take One Tablet By Mouth  Daily 12)  Accu-Chek Compact  Strp (Glucose Blood) .... Use As Directed 13)  Vitamin D (Ergocalciferol) 50000 Unit Caps (Ergocalciferol) .... Take One Cap Two Times A Week 14)  Avapro 300 Mg Tabs (Irbesartan) .... Take 1 Tablet By Mouth Once A Day  Allergies (verified): No Known Drug Allergies  Past History:  Past medical history reviewed for relevance to current acute and chronic problems.  Past Medical History: Reviewed history from 04/12/2009 and no changes required. CORONARY ARTERY DISEASE (414.01) sees Dr. Tonny Bollman      a.  s/p cath with PCI of RCA (xience des) June 2010.  Pt also with LAD and D2 dzs - NL FFR in both areas -          med Rx. HYPERTENSION (ICD-401.9) HYPERLIPIDEMIA (ICD-272.4) TIA (ICD-435.9) TRANSIENT ISCHEMIC ATTACK, HX OF (ICD-V12.50) GERD (ICD-530.81) DIABETES MELLITUS, TYPE II (ICD-250.00) ACUTE SINUSITIS, UNSPECIFIED (ICD-461.9) HEADACHE (ICD-784.0) ANEMIA-NOS (ICD-285.9) ASTHMA (ICD-493.90) ACUTE BRONCHITIS (ICD-466.0) EDEMA (ICD-782.3) WELL ADULT EXAM (ICD-V70.0) DEPRESSION (ICD-311) FAMILY HISTORY DIABETES 1ST DEGREE RELATIVE (ICD-V18.0) FAMILY HISTORY OF CAD FEMALE 1ST DEGREE RELATIVE <50 (ICD-V17.3) LOW BACK PAIN (ICD-724.2) ASTHMA (ICD-493.90) OBESITY Gastric Polyps Duodenal Nodule  Hemorrhoids Arthritis  Review of Systems       Negative except as per HPI   Vital Signs:  Patient profile:   65 year old female Height:      64 inches Weight:      210.50 pounds BMI:  36.26 Pulse rate:   72 / minute Pulse rhythm:   regular Resp:     18 per minute BP sitting:   160 / 74  (left arm) Cuff size:   large  Vitals Entered By: Vikki Ports (August 02, 2009 2:56 PM)  Physical Exam  General:  Pt is alert and oriented, in no acute distress. HEENT: normal Neck: normal carotid upstrokes without bruits, JVP normal Lungs: CTA CV: RRR without murmur or gallop Abd: soft, NT, positive BS, no bruit, no organomegaly Ext: 2+ pretibial  edema. peripheral pulses 2+ and equal Skin: warm and dry without rash    Impression & Recommendations:  Problem # 1:  EDEMA (ICD-782.3) Recommend leg elevation, Rx written for knee high compression stocking, and furosemide increased to 40 mg daily. KCl increased in 20 meq daily. Repeat BMET in 2 wks. No signs of CHF.  Problem # 2:  ESSENTIAL HYPERTENSION, MALIGNANT (ICD-401.0) BP control remains suboptimal, furosemide dose increased. Discussed salt restriction.  The following medications were removed from the medication list:    Cozaar 50 Mg Tabs (Losartan potassium) .Marland Kitchen... Take one tablet by mouth daily Her updated medication list for this problem includes:    Aspirin Ec 325 Mg Tbec (Aspirin) .Marland Kitchen... Take one tablet by mouth daily    Hydralazine Hcl 50 Mg Tabs (Hydralazine hcl) .Marland Kitchen... Take one tablet by mouth three times a day    Carvedilol 25 Mg Tabs (Carvedilol) .Marland Kitchen... Take one tablet by mouth twice a day    Furosemide 40 Mg Tabs (Furosemide) .Marland Kitchen... Take one tablet by mouth daily.    Avapro 300 Mg Tabs (Irbesartan) .Marland Kitchen... Take 1 tablet by mouth once a day  BP today: 160/74 Prior BP: 150/74 (06/10/2009)  Labs Reviewed: K+: 3.6 (12/23/2008) Creat: : 0.7 (12/23/2008)   Chol: 184 (07/16/2007)   HDL: 39.8 (07/16/2007)   LDL: 116 (07/16/2007)   TG: 139 (07/16/2007)  Problem # 3:  CORONARY ATHEROSCLEROSIS NATIVE CORONARY ARTERY (ICD-414.01) Myoview results reviewed. No angina at present. Continue dual antiplt Rx and secondary risk reduction measures as below.  Her updated medication list for this problem includes:    Aspirin Ec 325 Mg Tbec (Aspirin) .Marland Kitchen... Take one tablet by mouth daily    Plavix 75 Mg Tabs (Clopidogrel bisulfate) .Marland Kitchen... Take one tablet by mouth daily    Imdur 30 Mg Xr24h-tab (Isosorbide mononitrate) .Marland Kitchen... 1 tab by mouth once daily    Carvedilol 25 Mg Tabs (Carvedilol) .Marland Kitchen... Take one tablet by mouth twice a day    Nitrostat 0.4 Mg Subl (Nitroglycerin) .Marland Kitchen... As  directed  Patient Instructions: 1)  Your physician recommends that you return for lab work in: 2 WEEKS (BMP 401.9, 414.01, 782.3) 2)  Your physician has recommended you make the following change in your medication: INCREASE Furosemide to 40mg  once a day,  INCREASE Potassium to once a day 3)  Order for Knee high, medium compression stockings given to patient. 4)  Your physician recommends that you schedule a follow-up appointment in [place]. Prescriptions: POTASSIUM CHLORIDE CRYS CR 20 MEQ CR-TABS (POTASSIUM CHLORIDE CRYS CR) Take one tablet by mouth daily  #30 x 11   Entered by:   Julieta Gutting, RN, BSN   Authorized by:   Norva Karvonen, MD   Signed by:   Julieta Gutting, RN, BSN on 08/02/2009   Method used:   Electronically to        [facility] (retail)       [address]  [place], [place]  [address]       Ph: [phone]       Fax: [phone]   RxID:   [id number] FUROSEMIDE 40 MG TABS (FUROSEMIDE) Take one tablet by mouth daily.  #30 x 11   Entered by:   Julieta Gutting, RN, BSN   Authorized by:   Norva Karvonen, MD   Signed by:   Julieta Gutting, RN, BSN on 08/02/2009   Method used:   Electronically to        [facility] (retail)       [address]       [place], [place]  [address]       Ph: [phone]       Fax: [phone]   RxID:   [id number]

## 2010-08-08 NOTE — Progress Notes (Signed)
Summary: Crestor  Phone Note Call from Patient Call back at Home Phone [phone]   Caller: Patient Reason for Call: Talk to Nurse Summary of Call: returning call, about crestor Initial call taken by: Migdalia Dk,  August 17, 2009 9:04 AM  Follow-up for Phone Call        PER PT - SHE WAS RETURNING LAUREN'S CALL, I LET PT KNOW THE ONLY THING I SAW  IS THAT THE CRESTOR 40MG  WAS SENT INTO [facility].  PT AWARE I WILL FORWARD TO LAUREN AND SHE WILL CALL BACK IF NEEDED. Follow-up by: Charolotte Capuchin, RN,  August 17, 2009 9:56 AM  Additional Follow-up for Phone Call Additional follow up Details #1::        Doree Fudge called the pt back because we needed to confirm what cholesterol medication the pt is currently taking.  Our office received a refill request for Crestor but looking back at the pt's medication list it states the pt was going to switch to Lipitor when she completed her Crestor Rx.  I spoke with the pt and confirmed that she is taking Crestor 40mg  daily.  The pt is also complaining of some hip and back pain and feels like Crestor could be causing these symptoms.  I asked the pt to take a medication holiday  from Crestor to see if her symptoms improved (the pt does also have fibromyalgia).  The pt will call back if her symptoms improve off of Crestor.  Additional Follow-up by: Julieta Gutting, RN, BSN,  August 18, 2009 9:15 AM    New/Updated Medications: CRESTOR 40 MG TABS (ROSUVASTATIN CALCIUM) Take one tablet by mouth daily Prescriptions: CRESTOR 40 MG TABS (ROSUVASTATIN CALCIUM) Take one tablet by mouth daily  #30 x 6   Entered by:   Julieta Gutting, RN, BSN   Authorized by:   Norva Karvonen, MD   Signed by:   Julieta Gutting, RN, BSN on 08/18/2009   Method used:   Electronically to        [facility] (retail)       [address]       [place], [place]  [address]       Ph: [phone]       Fax: [phone]   RxID:    [id number]

## 2010-08-08 NOTE — Letter (Signed)
Summary: [facility]  [facility]   Imported By: Maryln Gottron 12/23/2009 15:26:27  _____________________________________________________________________  External Attachment:    Type:   Image     Comment:   External Document

## 2010-08-10 NOTE — Progress Notes (Signed)
Summary: PT OUT OF PLAVIX---DISCONTINUE PLAVIX  Phone Note Call from Patient Call back at Home Phone [phone]   Caller: Patient Reason for Call: Talk to Nurse, Talk to Doctor Summary of Call: PT IS COMPLETELY OU TOF PLAVIX AND WAITING ON IT TO BE DELIVERED TO [place] AND SHE WAS WONDERIN WHAT DOSE SHE NEED TO DO SHE WANTS A CALL TODAY CAUSE SHE IS OUT Initial call taken by: Omer Jack,  July 05, 2010 3:51 PM  Follow-up for Phone Call        [facility] for [facility] that we are looking into   Trying to see where the medication is and will get back in touch with her Dennis Bast, RN, BSN  July 05, 2010 4:41 PM Selena Batten is looking for paper work and will let us know if she finds Dennis Bast, RN, BSN  July 05, 2010 5:18 PM Spoke with Ryland at [facility] they have never recieved paper work we can refax to emergency line at [phone] Atn Ryland and put on cover sheet  Please rush  Pt out of medication   Call back in 24-48 hours and see if they recieved fax Dennis Bast, RN, BSN  July 05, 2010 5:20 PM  Additional Follow-up for Phone Call Additional follow up Details #1::        adv pt that have samples of med and will leave at front desk. kim adv that p/w was given to msg nurse on the 9th so will have to try and track it down to get it fax'd so pt can receive her medication.  Additional Follow-up by: Claris Gladden RN,  July 06, 2010 9:29 AM    Additional Follow-up for Phone Call Additional follow up Details #2::    new p/w for pt to sign at front desk. left msg earlier for pt that need proof of income. Claris Gladden, RN, BSN 12/29 1036 APPLICATION REFAXED TO BMS. Follow-up by: Scherrie Bateman, LPN,  July 06, 2010 1:33 PM  Additional Follow-up for Phone Call Additional follow up Details #3:: Details for Additional Follow-up Action Taken: pt can discontinue plavix - she is greater than 12 months out from PCI with a drug-eluting stent. Left msg with patient's husband for her to return  my call    Left message for pt at home number, OK to d/c Plavix per Dr Excell Seltzer  06/27/10 9:40 am  Sander Nephew, RN Additional Follow-up by: Norva Karvonen, MD,  July 06, 2010 1:57 PM   Appended Document: PT OUT OF PLAVIX I spoke with the pt and made her aware that she can discontinue Plavix per Dr Excell Seltzer.  The pt is very happy to be able to stop a medication.  I did advise the pt that she cannot stop ASA.  Pt agreed.   Clinical Lists Changes  Medications: Removed medication of PLAVIX 75 MG TABS (CLOPIDOGREL BISULFATE) Take one tablet by mouth daily

## 2010-08-10 NOTE — Letter (Signed)
Summary: [facility] [employer] [facility]   Imported By: Roderic Ovens 07/06/2010 12:47:19  _____________________________________________________________________  External Attachment:    Type:   Image     Comment:   External Document

## 2010-08-11 ENCOUNTER — Encounter: Payer: Self-pay | Admitting: Cardiovascular Disease

## 2010-08-14 ENCOUNTER — Telehealth: Payer: Self-pay | Admitting: Cardiovascular Disease

## 2010-08-15 ENCOUNTER — Encounter: Payer: Self-pay | Admitting: Cardiovascular Disease

## 2010-08-16 NOTE — Letter (Signed)
Summary: [facility]  [facility]   Imported By: Maryln Gottron 08/11/2010 12:23:00  _____________________________________________________________________  External Attachment:    Type:   Image     Comment:   External Document

## 2010-08-24 ENCOUNTER — Other Ambulatory Visit (INDEPENDENT_AMBULATORY_CARE_PROVIDER_SITE_OTHER): Payer: Medicare Other

## 2010-08-24 DIAGNOSIS — I6529 Occlusion and stenosis of unspecified carotid artery: Secondary | ICD-10-CM

## 2010-08-24 NOTE — Progress Notes (Signed)
Summary: pt has approval for avapro  Phone Note Other Incoming Call back at [phone]   Caller: Tasia Catchings from [employer] Reason for Call: Get patient information Summary of Call: pt needed approval for avapro and it has come through so it can be ordered at anytime/lg Initial call taken by: Omer Jack,  August 14, 2010 4:24 PM  Follow-up for Phone Call        I spoke with the pt and let her know that Avapro was approved.  New Rx sent to the pt's pharmacy.  Follow-up by: Julieta Gutting, RN, BSN,  August 14, 2010 4:28 PM    New/Updated Medications: AVAPRO 300 MG TABS (IRBESARTAN) Take 1 tablet by mouth once a day Prescriptions: AVAPRO 300 MG TABS (IRBESARTAN) Take 1 tablet by mouth once a day  #30 x 11   Entered by:   Julieta Gutting, RN, BSN   Authorized by:   Norva Karvonen, MD   Signed by:   Julieta Gutting, RN, BSN on 08/14/2010   Method used:   Electronically to        [facility] (retail)       [address]       [place], [place]  [address]       Ph: [phone]       Fax: [phone]   RxID:   [id number]

## 2010-09-01 NOTE — Procedures (Unsigned)
CAROTID DUPLEX EXAM  INDICATION:  Follow up carotid disease.  HISTORY: Diabetes:  Yes Cardiac:  Yes Hypertension:  Yes Smoking:  Previous Previous Surgery:  no CV History:  TIA in 2009. Amaurosis Fugax No, Paresthesias No, Hemiparesis No                                      RIGHT             LEFT Brachial systolic pressure:         164               170 Brachial Doppler waveforms:         wnl               wnl Vertebral direction of flow:        antegrade         Abnormal antegrade DUPLEX VELOCITIES (cm/sec) CCA peak systolic                   105               105 ECA peak systolic                   94                104 ICA peak systolic                   360               109 ICA end diastolic                   121               46 PLAQUE MORPHOLOGY:                  heterogeneous     heterogeneous PLAQUE AMOUNT:                      Severe            Moderate PLAQUE LOCATION:                    ICA               ICA  IMPRESSION: 1. An 80% to 99% right internal carotid artery stenosis.  This has     progressed since previous exam of 12/2009. 2. A 40% to 59% left internal carotid artery stenosis, this appears     stable. 3. Right vertebral artery is within normal limits 4. Abnormal left vertebral artery with  apparently normal left     subclavian.  Results were reported to Dr. Imogene Burn on 08/24/2010. The patient was instructed to set up an appointment with Dr. Darrick Penna and remain on aspirin therapy.  ___________________________________________ Janetta Hora. Fields, MD  LT/MEDQ  D:  08/24/2010  T:  08/24/2010  Job:  [id number]

## 2010-09-07 ENCOUNTER — Ambulatory Visit: Payer: Medicare Other | Admitting: Vascular Surgery

## 2010-09-07 ENCOUNTER — Ambulatory Visit (INDEPENDENT_AMBULATORY_CARE_PROVIDER_SITE_OTHER): Payer: Medicare Other | Admitting: Vascular Surgery

## 2010-09-07 DIAGNOSIS — I6529 Occlusion and stenosis of unspecified carotid artery: Secondary | ICD-10-CM

## 2010-09-07 HISTORY — PX: CAROTID ENDARTERECTOMY: SUR193

## 2010-09-08 NOTE — Assessment & Plan Note (Signed)
OFFICE VISIT  CHANCY, CLAROS B DOB:  1945/12/10                                       09/07/2010 ZOXWR#:[id number]  Ms. Garant returns today for evaluation of a high-grade carotid stenosis.  She had been seen in December 2009 at which time she had a moderate carotid stenosis and she underwent a carotid angiogram which confirmed a 50% carotid stenosis.  Since that time, she has been having intermittent surveillance duplex ultrasounds.  Her most recent duplex ultrasound on February 16 showed that the stenosis on the right side had progressed to greater than 90% with a peak systolic velocity greater than 360 cm/sec and end-diastolic velocity 121 cm/sec.  She still had a 40-60% stenosis on the left side.  Her chronic medical problems include diabetes, hypertension, elevated cholesterol.  These are all currently controlled .  She also has a history of coronary artery disease and had a cardiac stent placed 2 years ago.  This was done by Dr. Tonny Bollman.  She states she also had a stress test 1-2 years ago and says that this was okay.  She denies any chest pain currently.  She denies any symptoms of TIA, amaurosis or stroke.  She denies any shortness of breath and is able to climb a couple of stairs without becoming short of breath.  PAST MEDICAL HISTORY:  As listed above.  PAST SURGICAL HISTORY:  She had a fairly severe nosebleed and was treated at [facility] with what sounds like an intraarterial catheter intervention.  We will try to get the records regarding this. She has also previously had cholecystectomy, hysterectomy and coronary stent as mentioned above.  SOCIAL HISTORY:  She is married.  She has 1 child.  She is retired.  She smoked briefly as a young person but has not smoked at all recently. She does not consume alcohol regularly.  FAMILY HISTORY:  Remarkable for her brother who had peripheral arterial disease.  REVIEW OF SYSTEMS:   She has had a TIA in the remote past which involved some left-sided numbness and tingling. CARDIAC:  She has a history of coronary disease and a cardiac murmur. GI:  She has history of reflux. HEMATOLOGIC:  She had the nose bleed problem as mentioned, although she did not have a problem with coagulopathy. MUSCULOSKELETAL:  She has a history of fibromyalgia. GENERAL:  Weight is 200 pounds, height 5 feet 4 and a half. All other systems were negative.  MEDICATIONS:  Klor-Con 20 once a day, Avapro 300 mg once a day, hydralazine 50 mg 3 times a day, Lasix 40 mg once a day, pantoprazole 40 mg once a day, isosorbide mononitrate 60 mg once a day, aspirin 81 mg once a day, Crestor 40 mg once a day.  ALLERGIES:  She has no known drug allergies.  PHYSICAL EXAMINATION:  Blood pressure 175/77 in the right arm, 169/74 in the left arm, oxygen saturation is 98% on room air, heart rate 62 and regular. HEENT:  Unremarkable. NECK:  Has 2+ carotid pulses.  She has a left-sided carotid bruit.  She has an absent carotid bruit on the right side which is the side of her stenosis. CHEST:  Clear to auscultation. HEART:  Regular rate and rhythm.  I cannot appreciate a cardiac murmur today. ABDOMEN:  Slightly obese, soft, nontender, nondistended. MUSCULOSKELETAL:  Exam shows no major obvious joint  deformities although she does walk with a slightly shuffling gait. NEUROLOGIC:  Exam shows symmetric upper extremity lower extremity motor strength which is 5/5. SKIN:  Has no open ulcers or rashes.  Although she is asymptomatic, Ms. Borowiak's duplex exam shows that the right carotid stenosis has progressed to greater than 80%.  I believe the best option at this point would be for elective right carotid endarterectomy for stroke prophylaxis.  Risks, benefits, possible complications and procedure details were explained to the patient today including but not limited to bleeding, infection, cranial nerve  injury, stroke risk.  She understands and agrees to proceed.  Her right carotid endarterectomy is scheduled for September 12, 2010.  She will continue to take her aspirin up to the time of operation including the morning of her operation.    Janetta Hora. Fields, MD Electronically Signed  CEF/MEDQ  D:  09/07/2010  T:  09/08/2010  Job:  [id number]  cc:   Veverly Fells. Excell Seltzer, MD Tera Mater. Evlyn Kanner, M.D.

## 2010-09-12 ENCOUNTER — Inpatient Hospital Stay (HOSPITAL_COMMUNITY): Payer: Medicare Other

## 2010-09-12 ENCOUNTER — Other Ambulatory Visit: Payer: Self-pay | Admitting: Vascular Surgery

## 2010-09-12 ENCOUNTER — Inpatient Hospital Stay (HOSPITAL_COMMUNITY)
Admission: RE | Admit: 2010-09-12 | Discharge: 2010-09-14 | DRG: 039 | Disposition: A | Discharge status: Home / Self Care | Payer: Medicare Other | Source: Ambulatory Visit | Attending: Vascular Surgery | Admitting: Vascular Surgery

## 2010-09-12 DIAGNOSIS — E119 Type 2 diabetes mellitus without complications: Secondary | ICD-10-CM | POA: Diagnosis present

## 2010-09-12 DIAGNOSIS — I2510 Atherosclerotic heart disease of native coronary artery without angina pectoris: Secondary | ICD-10-CM | POA: Diagnosis present

## 2010-09-12 DIAGNOSIS — E785 Hyperlipidemia, unspecified: Secondary | ICD-10-CM | POA: Diagnosis present

## 2010-09-12 DIAGNOSIS — Z87891 Personal history of nicotine dependence: Secondary | ICD-10-CM

## 2010-09-12 DIAGNOSIS — Z8673 Personal history of transient ischemic attack (TIA), and cerebral infarction without residual deficits: Secondary | ICD-10-CM

## 2010-09-12 DIAGNOSIS — K219 Gastro-esophageal reflux disease without esophagitis: Secondary | ICD-10-CM | POA: Diagnosis present

## 2010-09-12 DIAGNOSIS — I6529 Occlusion and stenosis of unspecified carotid artery: Secondary | ICD-10-CM

## 2010-09-12 DIAGNOSIS — Z9861 Coronary angioplasty status: Secondary | ICD-10-CM

## 2010-09-12 DIAGNOSIS — I10 Essential (primary) hypertension: Secondary | ICD-10-CM | POA: Diagnosis present

## 2010-09-12 DIAGNOSIS — Z7982 Long term (current) use of aspirin: Secondary | ICD-10-CM

## 2010-09-12 DIAGNOSIS — IMO0001 Reserved for inherently not codable concepts without codable children: Secondary | ICD-10-CM | POA: Diagnosis present

## 2010-09-12 DIAGNOSIS — Z91041 Radiographic dye allergy status: Secondary | ICD-10-CM

## 2010-09-12 DIAGNOSIS — Z794 Long term (current) use of insulin: Secondary | ICD-10-CM

## 2010-09-12 LAB — PROTIME-INR
INR: 1.12 (ref 0.00–1.49)
Prothrombin Time: 14.6 seconds (ref 11.6–15.2)

## 2010-09-12 LAB — COMPREHENSIVE METABOLIC PANEL
ALT: 18 U/L (ref 0–35)
Albumin: 3.8 g/dL (ref 3.5–5.2)
Alkaline Phosphatase: 71 U/L (ref 39–117)
Calcium: 9.4 mg/dL (ref 8.4–10.5)
Glucose, Bld: 94 mg/dL (ref 70–99)
Potassium: 3.4 mEq/L — ABNORMAL LOW (ref 3.5–5.1)
Sodium: 140 mEq/L (ref 135–145)
Total Protein: 6.7 g/dL (ref 6.0–8.3)

## 2010-09-12 LAB — GLUCOSE, CAPILLARY
Glucose-Capillary: 88 mg/dL (ref 70–99)
Glucose-Capillary: 117 mg/dL — ABNORMAL HIGH (ref 70–99)
Glucose-Capillary: 137 mg/dL — ABNORMAL HIGH (ref 70–99)

## 2010-09-12 LAB — SURGICAL PCR SCREEN
MRSA, PCR: NEGATIVE
Staphylococcus aureus: POSITIVE — AB

## 2010-09-12 LAB — CBC
HCT: 32.9 % — ABNORMAL LOW (ref 36.0–46.0)
Hemoglobin: 10.1 g/dL — ABNORMAL LOW (ref 12.0–15.0)
MCHC: 30.7 g/dL (ref 30.0–36.0)
RBC: 4.52 MIL/uL (ref 3.87–5.11)
WBC: 9.9 10*3/uL (ref 4.0–10.5)

## 2010-09-12 LAB — TYPE AND SCREEN
ABO/RH(D): A POS
Antibody Screen: NEGATIVE

## 2010-09-12 LAB — APTT: aPTT: 31 seconds (ref 24–37)

## 2010-09-13 LAB — CBC
Hemoglobin: 8.7 g/dL — ABNORMAL LOW (ref 12.0–15.0)
MCH: 22 pg — ABNORMAL LOW (ref 26.0–34.0)
MCV: 71 fL — ABNORMAL LOW (ref 78.0–100.0)
RBC: 3.96 MIL/uL (ref 3.87–5.11)

## 2010-09-13 LAB — GLUCOSE, CAPILLARY: Glucose-Capillary: 162 mg/dL — ABNORMAL HIGH (ref 70–99)

## 2010-09-13 LAB — BASIC METABOLIC PANEL
CO2: 25 mEq/L (ref 19–32)
Chloride: 100 mEq/L (ref 96–112)
Creatinine, Ser: 0.84 mg/dL (ref 0.4–1.2)
GFR calc Af Amer: >60 mL/min (ref >60)

## 2010-09-14 LAB — GLUCOSE, CAPILLARY: Glucose-Capillary: 172 mg/dL — ABNORMAL HIGH (ref 70–99)

## 2010-09-14 NOTE — Letter (Signed)
Summary: [employer] [facility] [place]  [place]   Imported By: Marylou Mccoy 09/04/2010 14:02:52  _____________________________________________________________________  External Attachment:    Type:   Image     Comment:   External Document

## 2010-09-15 LAB — GLUCOSE, CAPILLARY: Glucose-Capillary: 137 mg/dL — ABNORMAL HIGH (ref 70–99)

## 2010-09-19 NOTE — Medication Information (Signed)
Summary: [employer] Drug Request Form   [employer] Drug Request Form   Imported By: Roderic Ovens 09/14/2010 15:32:52  _____________________________________________________________________  External Attachment:    Type:   Image     Comment:   External Document

## 2010-09-25 NOTE — Op Note (Signed)
NAMESANAI, FRICK             ACCOUNT NO.:  [id number]  MEDICAL RECORD NO.:  [id number]           PATIENT TYPE:  I  LOCATION:  3313                         FACILITY:  [facility]  PHYSICIAN:  Janetta Hora. Fields, MD  DATE OF BIRTH:  [birth year]  DATE OF PROCEDURE:  09/12/2010 DATE OF DISCHARGE:                              OPERATIVE REPORT   PROCEDURE:  Right carotid endarterectomy.  PREOPERATIVE DIAGNOSIS:  Right internal carotid artery stenosis.  POSTOPERATIVE DIAGNOSIS:  Right internal carotid artery stenosis.  ANESTHESIA:  General.  ASSISTANT:  Pecola Leisure, PA-C  OPERATIVE FINDINGS: 1. 50% right internal carotid artery stenosis. 2. 70% right external carotid artery stenosis. 3. Dacron patch. 4. Short 10-French shunt.  OPERATIVE INDICATIONS:  The patient is a 65 year old female with multiple risk factors who has been noted to have greater than 80% stenosis by carotid duplex exam.  She has been asymptomatic.  OPERATIVE DETAILS:  After obtaining informed consent, the patient was taken to the operating room.  The patient was placed in supine position on the operating room table.  After induction of general anesthesia and endotracheal intubation, a Foley catheter was placed.  Next, the patient's entire right neck and chest were prepped and draped in usual sterile fashion.  An oblique incision was made on the right side of the neck just anterior to the border of the right sternocleidomastoid muscle.  Incision was carried down through the subcutaneous tissues to the platysma.  Sternocleidomastoid muscle was identified, reflected laterally.  The incision was deepened, the internal jugular vein was identified and reflected laterally.  Common facial vein was dissected free circumferentially and ligated by between silk ties.  Dissection was carried down at the level of the common carotid artery.  The vagus nerve was identified and protected.  The common carotid artery was  dissected free circumferentially at the base of the incision just above the level of the omohyoid muscle.  The common carotid artery was soft in character.  Dissection was then carried up to level of the carotid bifurcation.  This was fairly heavily calcified.  Dissection was carried up to the level of a normal-appearing segment of internal carotid artery.  This was up at the level of the hypoglossal nerve, but not encroaching upon it.  The hypoglossal nerve was identified and protected.  The ansa cervicalis was divided to allow good exposure of the distal internal carotid artery.  Superior thyroid artery and external carotid artery dissected free circumferentially and vessel loops placed around these.  [name] mean arterial pressure was then raised to 90 mmHg.  The distal internal carotid artery was controlled with a fine bulldog clamp.  The external carotid artery and superior thyroid artery was controlled with vessel loops.  The common carotid artery was controlled with peripheral DeBakey clamp. Longitudinal opening was made in the common carotid artery just above the carotid bifurcation.  The arteriotomy was extended superiorly up through the carotid bifurcation and into the internal carotid artery. There was a calcified plaque at the carotid bifurcation.  This was approximately 50% stenosis.  There was approximately 70% stenosis of the takeoff of the origin of  the external carotid artery.  Next, a 10-French shunt was brought up in the operative field and this was threaded into the distal internal carotid artery and allowed to back bleed thoroughly. There was excellent backbleeding.  This threaded down the common carotid artery and controlled with a Rumel tourniquet.  Shunt was then opened with restoration of flow to the brain after approximately 4 minutes. Endarterectomy was then begun in a suitable plane near the carotid bifurcation.  A good proximal endpoint was obtained.   The external carotid artery was fairly diseased and this was endarterectomized by eversion technique.  It should be noted that the internal carotid artery was fairly rotated anterior to posterior, so this had to be rotated anteriorly during the portion of the case while I was doing endarterectomy.  A good distal endpoint was obtained and all loose debris was then removed from the carotid bed.  This was thoroughly irrigated with heparinized saline.  The Dacron patch was then brought up in the operative field and sewn on as a patch angioplasty using running 6-0 Prolene suture.  Just prior to completion of anastomosis, the shunt was re-occluded and brought down out of the internal carotid artery and this was allowed to back bleed thoroughly.  This was then re-controlled with fine bulldog clamp.  Shunt was then removed from the proximal common carotid artery and this was controlled with peripheral DeBakey clamp.  The remainder of the patch was then completed after forward bleeding and backbleeding and flushing everything thoroughly.  Flow was then first restored retrograde from the external carotid to the common carotid artery and then common carotid to the external carotid artery, then finally after approximately 5 cardiac cycles to the internal carotid artery.  Doppler was used to evaluate the internal carotid artery and there was good flow through this.  The external common carotid arteries also had good Doppler flow.  One single repair stitch was placed at the proximal end of the patch to achieve hemostasis. Thrombin and Gelfoam was also applied and the patient's heparin was reversed with 70 mg of protamine.  The patient had been given 7000 units of heparin prior to clamping the internal carotid artery rigidly.  After hemostasis had been obtained, the platysma muscle was reapproximated using running 3-0 Vicryl sutures.  Skin was closed with 4- 0 Vicryl subcuticular stitch.  The patient  tolerated the procedure well and there were no complications.  Instrument, sponge and needle counts were correct at the end of the case.  The patient was taken to the recovery room in stable condition.  She was moving her upper extremities and lower extremities symmetrically at the end of the case and her tongue was midline.     Janetta Hora. Fields, MD     CEF/MEDQ  D:  09/12/2010  T:  09/13/2010  Job:  [id number]  Electronically Signed by Fabienne Bruns MD on 09/25/2010 09:04:20 AM

## 2010-09-25 NOTE — Discharge Summary (Signed)
[name], [name]             ACCOUNT NO.:  [id number]  MEDICAL RECORD NO.:  [id number]           PATIENT TYPE:  I  LOCATION:  3313                         FACILITY:  [facility]  PHYSICIAN:  Janetta Hora. Edin Kon, MD  DATE OF BIRTH:  02-27-1946  DATE OF ADMISSION:  09/12/2010 DATE OF DISCHARGE:  09/14/2010                              DISCHARGE SUMMARY   ADMIT DIAGNOSIS:  Right internal carotid artery stenosis.  PAST MEDICAL HISTORY/DISCHARGE DIAGNOSES: 1. Right internal carotid artery stenosis status post right carotid     endarterectomy with Dacron patch angioplasty. 2. Diabetes mellitus. 3. Hypertension. 4. Hyperlipidemia. 5. History of nosebleed that required intervention. 6. Cholecystectomy. 7. Hysterectomy. 8. Coronary artery disease status post cardiac stent placed 2 years     ago and followed by Dr. Tonny Bollman.  ALLERGIES:  CONTRAST MEDIA intolerance.  BRIEF HISTORY:  The patient is a 65 year old African American female who has been followed by Dr. Darrick Penna for high-grade carotid stenosis.  She had been seen in 2009 at which time she had a moderate carotid stenosis and underwent a carotid angiogram which confirmed a 50% carotid stenosis.  Since that time, she had intermittent surveillance with duplex ultrasounds.  Most recent duplex on August 24, 2010, showed that the stenosis on the right had progressed to greater than 90%.  She was asymptomatic.  Secondary to the progression and the level of stenosis, it was recommended the patient undergo carotid endarterectomy for stroke risk reduction.  HOSPITAL COURSE:  The patient was admitted and taken to the OR on September 12, 2010, for a right carotid endarterectomy with Dacron patch angioplasty.  The patient tolerated the procedure well and was hemodynamically stable immediately postoperatively.  The patient was transferred from the OR to the postanesthesia care unit in stable condition.  The patient was extubated without  complication and woke up from anesthesia neurologically intact.  [name] postoperative course was complicated by her hypertension. This was regulated with IV medications.  This was thought to be due to  to withdrawal from her home medications.  Her home meds were restarted and her blood pressure subsequently responded appropriately.  The patient has been able to void, ambulate, and tolerate a regular diet without any difficulty.  On September 14, 2010, the patient is without complaint.  Her blood pressure has been well controlled overnight with her p.o. medications.  On physical exam, cardiac is regular rate and rhythm.  Lungs clear to auscultation.  The abdomen is benign.  The incisions are clean, dry, and intact with no evidence of that time.  The patient is neurologically intact.  The patient is doing well and felt stable for discharge home at this time.  LABORATORY DATA:  CBC and BMP on September 13, 2010, white count is 12.9, hemoglobin 8.7, hematocrit 28.1, platelets 237,000, sodium 131, potassium 3.4 BUN 10, creatinine 8.4.  DISCHARGE INSTRUCTIONS:  The patient received specific written discharge instructions regarding diet, activity, and wound care.  She will follow up with Dr. Darrick Penna in approximately 2 weeks.  The patient will be contacted with the date and time of that appointment.  She is to follow  up with Dr. Excell Seltzer in regard to her blood pressure monitoring.  The patient is advised to obtain a blood pressure cuff for home and monitor her blood pressures so that this information can be relayed to Dr. Excell Seltzer.  If the patient has any questions, issues, or problems, she is to contact our office at [phone].  DISCHARGE MEDICATIONS: 1. Oxycodone/acetaminophen 5/325 mg 1-2 p.o. q.4-6 h. p.r.n. pain. 2. Aspirin 81 mg daily. 3. Avapro 300 mg daily. 4. Crestor 40 mg day. 5. Furosemide 40 mg day. 6. Hydralazine 50 mg 1 tablet t.i.d. 7. Isosorbide mononitrate XR 60 mg daily. 8.  Klor-Con 20 mEq daily. 9. Lantus 30 units subcu b.i.d. 10.NovoLog 5 units subcu t.i.d. 11.Pantoprazole 40 mg daily. 12.Voltaren one application topically b.i.d. p.r.n.     Pecola Leisure, PA   ______________________________ Janetta Hora Raudel Bazen, MD    AY/MEDQ  D:  09/14/2010  T:  09/14/2010  Job:  [id number]  Electronically Signed by Pecola Leisure PA on 09/15/2010 11:57:09 AM Electronically Signed by Fabienne Bruns MD on 09/25/2010 09:04:13 AM

## 2010-09-28 ENCOUNTER — Ambulatory Visit (INDEPENDENT_AMBULATORY_CARE_PROVIDER_SITE_OTHER): Payer: Medicare Other | Admitting: Vascular Surgery

## 2010-09-28 DIAGNOSIS — I6529 Occlusion and stenosis of unspecified carotid artery: Secondary | ICD-10-CM

## 2010-09-29 NOTE — Assessment & Plan Note (Signed)
OFFICE VISIT  [name], [name] DOB:  1946/01/28                                       09/28/2010 GEXBM#:[id number]  The patient returns today for followup after right carotid endarterectomy on 09/12/2010.  She denies any symptoms of TIA, amaurosis or stroke.  She has no evidence of weakness, numbness or tingling in her upper or lower extremities.  She continues to take 1 aspirin daily.  PHYSICAL EXAM:  Blood pressure is 165/71 in the right arm, 151/65 in the left arm, temperature is 97.4, heart rate is 71 and regular.  Right neck incision is well-healed.  She does have bilateral carotid bruits.  Upper extremity and lower extremity motor strength is 5/5 and symmetric.  The patient is doing well at this point after her recent carotid endarterectomy.  She will follow up in 6 months' time for repeat carotid duplex exam.  She is going to have yearly carotid duplex exam after that.  Of note, she does have a 40%-60% stenosis on the left side and we will continue to follow that.    Janetta Hora. Caidyn Henricksen, MD Electronically Signed  CEF/MEDQ  D:  09/28/2010  T:  09/29/2010  Job:  [id number]  cc:   Jeannett Senior A. Evlyn Kanner, M.D. Tera Mater. Clent Ridges, MD Veverly Fells. Excell Seltzer, MD

## 2010-10-12 LAB — GLUCOSE, CAPILLARY: Glucose-Capillary: 138 mg/dL — ABNORMAL HIGH (ref 70–99)

## 2010-10-14 LAB — GLUCOSE, CAPILLARY
Glucose-Capillary: 114 mg/dL — ABNORMAL HIGH (ref 70–99)
Glucose-Capillary: 136 mg/dL — ABNORMAL HIGH (ref 70–99)
Glucose-Capillary: 153 mg/dL — ABNORMAL HIGH (ref 70–99)
Glucose-Capillary: 163 mg/dL — ABNORMAL HIGH (ref 70–99)
Glucose-Capillary: 172 mg/dL — ABNORMAL HIGH (ref 70–99)
Glucose-Capillary: 220 mg/dL — ABNORMAL HIGH (ref 70–99)
Glucose-Capillary: 116 mg/dL — ABNORMAL HIGH (ref 70–99)
Glucose-Capillary: 134 mg/dL — ABNORMAL HIGH (ref 70–99)
Glucose-Capillary: 135 mg/dL — ABNORMAL HIGH (ref 70–99)
Glucose-Capillary: 212 mg/dL — ABNORMAL HIGH (ref 70–99)
Glucose-Capillary: 217 mg/dL — ABNORMAL HIGH (ref 70–99)
Glucose-Capillary: 306 mg/dL — ABNORMAL HIGH (ref 70–99)
Glucose-Capillary: 86 mg/dL (ref 70–99)

## 2010-10-15 LAB — GLUCOSE, CAPILLARY
Glucose-Capillary: 215 mg/dL — ABNORMAL HIGH (ref 70–99)
Glucose-Capillary: 252 mg/dL — ABNORMAL HIGH (ref 70–99)
Glucose-Capillary: 268 mg/dL — ABNORMAL HIGH (ref 70–99)

## 2010-10-16 LAB — HEMOGLOBIN A1C: Hgb A1c MFr Bld: 9.7 % — ABNORMAL HIGH (ref 4.6–6.1)

## 2010-10-16 LAB — BASIC METABOLIC PANEL
BUN: 7 mg/dL (ref 6–23)
CO2: 26 mEq/L (ref 19–32)
CO2: 27 mEq/L (ref 19–32)
Calcium: 8.7 mg/dL (ref 8.4–10.5)
Calcium: 8.8 mg/dL (ref 8.4–10.5)
Calcium: 9 mg/dL (ref 8.4–10.5)
Chloride: 108 mEq/L (ref 96–112)
Chloride: 109 mEq/L (ref 96–112)
Chloride: 111 mEq/L (ref 96–112)
Creatinine, Ser: 0.65 mg/dL (ref 0.4–1.2)
Creatinine, Ser: 0.69 mg/dL (ref 0.4–1.2)
Creatinine, Ser: 0.85 mg/dL (ref 0.4–1.2)
GFR calc Af Amer: >60 mL/min (ref >60)
GFR calc Af Amer: >60 mL/min (ref >60)
GFR calc Af Amer: >60 mL/min (ref >60)
GFR calc Af Amer: >60 mL/min (ref >60)
GFR calc non Af Amer: >60 mL/min (ref >60)
Glucose, Bld: 161 mg/dL — ABNORMAL HIGH (ref 70–99)
Glucose, Bld: 228 mg/dL — ABNORMAL HIGH (ref 70–99)
Sodium: 139 mEq/L (ref 135–145)
Sodium: 140 mEq/L (ref 135–145)

## 2010-10-16 LAB — CARDIAC PANEL(CRET KIN+CKTOT+MB+TROPI)
CK, MB: 1.7 ng/mL (ref 0.3–4.0)
CK, MB: 2 ng/mL (ref 0.3–4.0)
Relative Index: 1.9 (ref 0.0–2.5)
Total CK: 119 U/L (ref 7–177)
Total CK: 96 U/L (ref 7–177)
Troponin I: 0.01 ng/mL (ref 0.00–0.06)
Troponin I: 0.01 ng/mL (ref 0.00–0.06)

## 2010-10-16 LAB — POCT CARDIAC MARKERS
CKMB, poc: 1.8 ng/mL (ref 1.0–8.0)
Myoglobin, poc: 120 ng/mL (ref 12–200)
Troponin i, poc: <0.05 ng/mL (ref 0.00–0.09)

## 2010-10-16 LAB — GLUCOSE, CAPILLARY
Glucose-Capillary: 308 mg/dL — ABNORMAL HIGH (ref 70–99)
Glucose-Capillary: 334 mg/dL — ABNORMAL HIGH (ref 70–99)
Glucose-Capillary: 139 mg/dL — ABNORMAL HIGH (ref 70–99)
Glucose-Capillary: 162 mg/dL — ABNORMAL HIGH (ref 70–99)
Glucose-Capillary: 167 mg/dL — ABNORMAL HIGH (ref 70–99)
Glucose-Capillary: 178 mg/dL — ABNORMAL HIGH (ref 70–99)
Glucose-Capillary: 200 mg/dL — ABNORMAL HIGH (ref 70–99)
Glucose-Capillary: 206 mg/dL — ABNORMAL HIGH (ref 70–99)
Glucose-Capillary: 232 mg/dL — ABNORMAL HIGH (ref 70–99)
Glucose-Capillary: 243 mg/dL — ABNORMAL HIGH (ref 70–99)

## 2010-10-16 LAB — CBC
HCT: 36.8 % (ref 36.0–46.0)
HCT: 37 % (ref 36.0–46.0)
HCT: 37.8 % (ref 36.0–46.0)
HCT: 41.2 % (ref 36.0–46.0)
Hemoglobin: 11.7 g/dL — ABNORMAL LOW (ref 12.0–15.0)
Hemoglobin: 12.3 g/dL (ref 12.0–15.0)
Hemoglobin: 12.3 g/dL (ref 12.0–15.0)
Hemoglobin: 12.7 g/dL (ref 12.0–15.0)
Hemoglobin: 14 g/dL (ref 12.0–15.0)
MCHC: 33.3 g/dL (ref 30.0–36.0)
MCHC: 33.4 g/dL (ref 30.0–36.0)
MCHC: 33.6 g/dL (ref 30.0–36.0)
MCHC: 33.6 g/dL (ref 30.0–36.0)
MCHC: 33.7 g/dL (ref 30.0–36.0)
MCV: 87 fL (ref 78.0–100.0)
MCV: 87.6 fL (ref 78.0–100.0)
MCV: 87.9 fL (ref 78.0–100.0)
Platelets: 208 10*3/uL (ref 150–400)
RBC: 3.97 MIL/uL (ref 3.87–5.11)
RBC: 4.19 MIL/uL (ref 3.87–5.11)
RBC: 4.21 MIL/uL (ref 3.87–5.11)
RBC: 4.27 MIL/uL (ref 3.87–5.11)
RBC: 4.32 MIL/uL (ref 3.87–5.11)
RDW: 14.3 % (ref 11.5–15.5)
RDW: 14.4 % (ref 11.5–15.5)
RDW: 14.5 % (ref 11.5–15.5)
RDW: 14.5 % (ref 11.5–15.5)
WBC: 10.9 10*3/uL — ABNORMAL HIGH (ref 4.0–10.5)

## 2010-10-16 LAB — DIFFERENTIAL
Basophils Absolute: 0 10*3/uL (ref 0.0–0.1)
Basophils Relative: 0 % (ref 0–1)
Eosinophils Absolute: 0.1 10*3/uL (ref 0.0–0.7)
Eosinophils Relative: 1 % (ref 0–5)
Monocytes Absolute: 0.7 10*3/uL (ref 0.1–1.0)
Monocytes Relative: 6 % (ref 3–12)
Neutro Abs: 6.7 10*3/uL (ref 1.7–7.7)

## 2010-10-16 LAB — COMPREHENSIVE METABOLIC PANEL
ALT: 23 U/L (ref 0–35)
Albumin: 3.9 g/dL (ref 3.5–5.2)
Alkaline Phosphatase: 83 U/L (ref 39–117)
BUN: 9 mg/dL (ref 6–23)
Chloride: 107 mEq/L (ref 96–112)
Potassium: 2.9 mEq/L — ABNORMAL LOW (ref 3.5–5.1)
Sodium: 141 mEq/L (ref 135–145)
Total Bilirubin: 0.5 mg/dL (ref 0.3–1.2)
Total Protein: 7.5 g/dL (ref 6.0–8.3)

## 2010-10-16 LAB — URINALYSIS, ROUTINE W REFLEX MICROSCOPIC
Bilirubin Urine: NEGATIVE
Glucose, UA: 250 mg/dL — AB
Hgb urine dipstick: NEGATIVE
Nitrite: NEGATIVE
Specific Gravity, Urine: 1.029 (ref 1.005–1.030)
pH: 5.5 (ref 5.0–8.0)

## 2010-10-16 LAB — LIPID PANEL
Cholesterol: 143 mg/dL (ref 0–200)
HDL: 39 mg/dL — ABNORMAL LOW (ref >39)
LDL Cholesterol: 82 mg/dL (ref 0–99)

## 2010-10-16 LAB — CK TOTAL AND CKMB (NOT AT ARMC)
CK, MB: 2.9 ng/mL (ref 0.3–4.0)
Relative Index: 2.2 (ref 0.0–2.5)
Total CK: 131 U/L (ref 7–177)

## 2010-10-16 LAB — URINE CULTURE: Colony Count: 3000

## 2010-10-16 LAB — HEPARIN LEVEL (UNFRACTIONATED): Heparin Unfractionated: 0.4 IU/mL (ref 0.30–0.70)

## 2010-10-16 LAB — TSH: TSH: 1.192 u[IU]/mL (ref 0.350–4.500)

## 2010-10-16 LAB — AMYLASE: Amylase: 65 U/L (ref 27–131)

## 2010-10-16 LAB — PROTIME-INR: INR: 1 (ref 0.00–1.49)

## 2010-10-26 ENCOUNTER — Encounter (INDEPENDENT_AMBULATORY_CARE_PROVIDER_SITE_OTHER): Payer: Medicare Other

## 2010-10-26 DIAGNOSIS — I6529 Occlusion and stenosis of unspecified carotid artery: Secondary | ICD-10-CM

## 2010-11-06 ENCOUNTER — Other Ambulatory Visit: Payer: Self-pay | Admitting: Cardiovascular Disease

## 2010-11-21 NOTE — Procedures (Signed)
CAROTID DUPLEX EXAM   INDICATION:  Followup known carotid artery disease.   HISTORY:  Diabetes:  Yes.  Cardiac:  Stent.  Hypertension:  Yes.  Smoking:  Previous.  Previous Surgery:  No.  CV History:  No.  Amaurosis Fugax No, Paresthesias No, Hemiparesis No                                       RIGHT             LEFT  Brachial systolic pressure:         200               200  Brachial Doppler waveforms:         WNL               WNL  Vertebral direction of flow:        Antegrade         Antegrade  DUPLEX VELOCITIES (cm/sec)  CCA peak systolic                   77                93  ECA peak systolic                   63                90  ICA peak systolic                   287               90  ICA end diastolic                   79                27  PLAQUE MORPHOLOGY:                  Heterogeneous     Heterogeneous  PLAQUE AMOUNT:                      Moderate          Mild  PLAQUE LOCATION:                    ICA               ICA   IMPRESSION:  1. Right internal carotid artery suggests 60%-79% stenosis.  2. Left internal carotid artery suggests 1%-39% stenosis.  3. Antegrade flow in bilateral vertebrals.   ___________________________________________  Janetta Hora Fields, MD   CB/MEDQ  D:  12/29/2009  T:  12/29/2009  Job:  [id number]

## 2010-11-21 NOTE — Assessment & Plan Note (Signed)
OFFICE VISIT   LEAIRA, [name]  DOB:  12-Dec-1945                                       07/07/2008  ZOXWR#:[id number]   The patient is a 65 year old female who was seen as a consult in June  2009.  At that time, she was having symptoms of numbness and tingling in  her left upper lip and some tingling in her left hand.  She underwent a  carotid angiogram at that time.  There was a 50% stenosis of the  internal carotid artery on the right side.  There was no ulceration.  There was no significant stenosis on the left internal carotid artery.  However, this was not selectively catheterized.   Her primary atherosclerotic risk factors continue to remain diabetes and  hypertension.  Her blood pressure was fairly poorly controlled today.  She states she still has some occasional numbness in the left corner of  her mouth but her left tongue symptoms have completely resolved and  overall she is feeling well.  She has had no further symptoms in the  left hand.  She is a former smoker but quit greater than 25 years ago.   PHYSICAL EXAM TODAY:  Vital Signs:  Blood pressure is 200/90 in the left  arm, 206/84 in the right arm, pulse is 51 and regular.  HEENT:  Unremarkable.  She has 2+ carotid pulses without bruit.  Chest:  Clear  to auscultation.  Cardiac:  Regular rate and rhythm.  Abdomen:  Obese,  soft, nontender, nondistended, no masses.  Extremities:  Upper extremity  and lower extremity motor strength is 5/5 and symmetric bilaterally.  Neuro:  Cranial nerves II-XII are intact.   CURRENT MEDICATIONS:  1. Vytorin  once daily.  2. Lasix 40 mg once a day.  3. Ferrous sulfate 325 mg once a day.  4. Metoprolol, dose not given, once a day.  5. Metformin, again dose unknown.  6. Verapamil.  7. Omeprazole.   She had a repeat carotid duplex exam today which showed a 60-80% right  internal carotid artery stenosis.  She had a 40-60% left internal  carotid artery  stenosis.   The patient has bilateral moderate carotid stenoses.  Right greater than  left.  These appear to be asymptomatic currently.  I believe the best  option for her currently is continued risk factor modification.  She  states that she has previously had nosebleeds with aspirin and is  intolerant to this.  She is currently taking three medications for her  blood pressure; however, she has fairly high blood pressure on exam  today.  Hopefully her cholesterol is well controlled.  She was counseled  to speak with her primary care doctor and schedule an appointment as  soon as possible to try to improve her blood pressure control.  She will  see me again in six months' time for repeat carotid duplex exam, or  sooner if she develops any neurologic symptoms.   Janetta Hora. Fields, MD  Electronically Signed   CEF/MEDQ  D:  07/07/2008  T:  07/08/2008  Job:  [id number]   cc:   Jeannett Senior A. Evlyn Kanner, M.D.  Tera Mater. Clent Ridges, MD

## 2010-11-21 NOTE — Discharge Summary (Signed)
[name], [name]             ACCOUNT NO.:  [id number]   MEDICAL RECORD NO.:  [id number]          PATIENT TYPE:  INP   LOCATION:  3010                         FACILITY:  [facility]   PHYSICIAN:  Valerie A. Felicity Coyer, MDDATE OF BIRTH:  1946-04-06   DATE OF ADMISSION:  12/30/2007  DATE OF DISCHARGE:  01/02/2008                               DISCHARGE SUMMARY   DIAGNOSES AT TIME OF DISCHARGE:  1. Transient ischemic attack with negative MRI.  2. Right carotid artery stenosis 60-80% per carotid duplex this      admission, seen by Dr. Darrick Penna of Vascular with plans for outpatient      angio, to be done this coming week.  3. Diabetes type 2, uncontrolled with hemoglobin A1c of 12.8.  4. Dyslipidemia, stable on statin.  5. Hypertension, uncontrolled.  An ACE inhibitor added to the      patient's regimen during this admission, but will likely need      continued upward titration, deferred to primary MD.  6. Gastroesophageal reflux disease, stable on proton pump inhibitor.  7. History of nasal sarcoidosis.  Plan for outpatient follow up with      Dr. Haroldine Laws.  8. Fibromyalgia, stable.  Plan for outpatient follow up with Dr.      Corliss Skains.  9. Asthma, stable.  Outpatient follow up as needed with Dr. Sherene Sires.  10.History of lumbar stenosis.  No complaints of back pain during this      admission.   HISTORY OF PRESENT ILLNESS:  [name] 65 year old African American  female admitted on December 30, 2007, with chief complaint of left-sided  facial and tongue numbness.  She noted that her symptoms occurred while  she was on her daily walk.  She also reported transient left arm  paresthesias, but denied abnormal or slurred speech.  She complained of  generalized weakness, but denied focal symptoms.  She was noted to have  uncontrolled blood pressure with a BP 218/81 in the office and was  admitted for further evaluation and treatment of questionable TIA.   PAST MEDICAL HISTORY:  1. Asthma.  2.  GERD.  3. Hypertension.  4. Chronic low back pain.  5. Hyperlipidemia.  6. Nasal sarcoidosis.  7. Depression.  8. Diabetes type 2.  9. Fibromyalgia.  10.Lumbar Stenosis.  11.Lumbar spondylosis with lumbar radiculopathy.   COURSE OF HOSPITALIZATION:  1. TIA.  The patient was admitted and CT of the head was performed,      which showed no acute changes.  Followup MRI of the brain noted      mild nonspecific white matter disease with right frontal and      ethmoid sinus inflammatory changes and noted no acute abnormality.      A 2-D echo was performed during this admission, which showed a left      ventricular ejection fraction of 60% and noted an aortic valve,      which was mildly calcified and the patient also underwent a carotid      duplex, which noted right 60-80% stenosis.  The patient was placed      on Aggrenox  during this admission and plan per Dr. Darrick Penna.  She is      scheduled for carotid angio to further determine the amount of      stenosis or she has an ulcerated plaque as a possible embolic      source.  She has been scheduled for Tuesday, January 06, 2008, by Dr.      Darrick Penna.  2. Uncontrolled diabetes type 2.  The patient was started on Lantus 20      units subcu daily during this admission; however, blood sugars      remained in the mid 250s; however, she has not received her home      Byetta.  Defer further changes to the patient's endocrinologist,      Dr. Adrian Prince.  Of note, the patient's Byetta was expired that      she brought here to the hospital raising a question of whether or      not she is actually taking this medication.   MEDICATIONS AT TIME OF DISCHARGE:  1. Aggrenox 1 capsule p.o. b.i.d.  2. Ferrous sulfate 325 mg p.o. daily.  3. Omeprazole 20 mg p.o. daily.  4. Byetta 5 mcg Pen x4.  5. Pulmicort Respules as needed.  6. Vytorin 10/10 one tablet p.o. daily.  7. Verapamil 240 mg p.o. daily.  8. Toprol-XL 50 mg p.o. daily.  9. Lisinopril 10 mg  p.o. daily.  10.Metformin 500 mg p.o. b.i.d.  11.Lantus insulin 20 units subcu injection daily.  12.Lasix 40 mg p.o. daily.   DISPOSITION:  The patient will be discharged to home.   ACTIVITY:  The patient is instructed to increase her activity slowly.  She is instructed to not drive until cleared by Dr. Clent Ridges.  Pending  results of carotid angio, which are treated and performed on Tuesday.  She is instructed also to be maintained on low-sodium, heart-healthy  diabetic diet.   PERTINENT LABORATORY DATA:  At time of discharge; hemoglobin 15.2,  hematocrit 44.1, white blood cell count 10, platelets 178, hemoglobin  A1c 12.8, homocystine 8, TSH 2.745, total cholesterol 121, HDL 36, and  LDL 55.   FOLLOWUP:  She is to follow up with Dr. Gershon Crane next week and to  follow up with Dr. Evlyn Kanner as scheduled.  She states she has an  appointment approximately 1 week with him.  She is also to follow up for  carotid angio, to be ordered by Dr. Darrick Penna.   Greater than 30 minutes were spent on discharge planning.      Sandford Craze, NP      Raenette Rover. Felicity Coyer, MD  Electronically Signed    MO/MEDQ  D:  01/02/2008  T:  01/03/2008  Job:  [id number]   cc:   Jeannett Senior A. Clent Ridges, MD  Tera Mater Evlyn Kanner, M.D.  Samule Dry

## 2010-11-21 NOTE — Procedures (Signed)
CAROTID DUPLEX EXAM   INDICATION:  Followup known carotid artery disease.   HISTORY:  Diabetes:  Yes.  Cardiac:  No.  Hypertension:  Yes.  Smoking:  Quit.  Previous Surgery:  No.  CV History:  No.  Amaurosis Fugax No, Paresthesias No, Hemiparesis No                                       RIGHT             LEFT  Brachial systolic pressure:         220               210  Brachial Doppler waveforms:         Biphasic          Biphasic  Vertebral direction of flow:        Antegrade         Antegrade  DUPLEX VELOCITIES (cm/sec)  CCA peak systolic                   96                90  ECA peak systolic                   108               81  ICA peak systolic                   284               156 (distal)  ICA end diastolic                   62                32  PLAQUE MORPHOLOGY:                  Heterogenous      Heterogenous  PLAQUE AMOUNT:                      Moderate to severe                  Mild  PLAQUE LOCATION:                    ICA and ECA       ICA and ECA   IMPRESSION:  1. 60-79% stenosis noted in the right ICA.  40-59% stenosis noted in      the left distal ICA.  2. Antegrade bilateral vertebral arteries.      ___________________________________________  Janetta Hora Fields, MD   MG/MEDQ  D:  07/07/2008  T:  07/07/2008  Job:  [id number]

## 2010-11-21 NOTE — Procedures (Signed)
CAROTID DUPLEX EXAM   INDICATION:  Carotid artery disease.   HISTORY:  Diabetes:  Yes  Cardiac:  No  Hypertension:  Yes  Smoking:  Previous  Previous Surgery:  No  CV History:  Mild left lip numbness for about 3 days  Amaurosis Fugax No, Paresthesias No, Hemiparesis No                                       RIGHT             LEFT  Brachial systolic pressure:         178               172  Brachial Doppler waveforms:         normal            normal  Vertebral direction of flow:        antegrade         Antegrade  DUPLEX VELOCITIES (cm/sec)  CCA peak systolic                   81                126  ECA peak systolic                   128               83  ICA peak systolic                   247               99  ICA end diastolic                   56                21  PLAQUE MORPHOLOGY:                  mixed             mixed  PLAQUE AMOUNT:                      Moderate          Mild  PLAQUE LOCATION:                    ICA/ECA           ICA/ECA   IMPRESSION:  1. 60 to 79% stenosis of the right internal carotid artery.  2. 1 to 39% stenosis of the left internal carotid artery.  3. Doppler velocities of the left internal carotid artery appear less      than previously recorded when compared to the previous exam on      07/07/2008 with the right internal carotid artery remaining stable.       ___________________________________________  Janetta Hora. Fields, MD   CH/MEDQ  D:  12/09/2008  T:  12/09/2008  Job:  [id number]

## 2010-11-21 NOTE — Consult Note (Signed)
[name], [name]             ACCOUNT NO.:  [id number]   MEDICAL RECORD NO.:  [id number]          PATIENT TYPE:  INP   LOCATION:  2312                         FACILITY:  [facility]   PHYSICIAN:  Jesse Sans. Wall, MD, FACCDATE OF BIRTH:  05-12-1946   DATE OF CONSULTATION:  DATE OF DISCHARGE:                                 CONSULTATION   PRIMARY CARDIOLOGIST:  Maisie Fus C. Daleen Squibb, MD, [facility]   PRIMARY CARE PHYSICIAN:  Tera Mater. Clent Ridges, MD   HISTORY OF PRESENT ILLNESS:  This is a 65 year old African American  female without prior cardiac history but positive for diabetes and  hypertension and carotid artery disease along with hypercholesterolemia  who was admitted with complaints of chest pain which began around noon  yesterday with soreness to palpation in the substernal area and then  increasing chest pressure while lying down.  She also noticed that her  left hand began to tingle radiating up into her left elbow.  The chest  pressure increased but it came and went until around 6 p.m. where she  decided to come to the emergency room for further evaluation.  She  describes the pain as a 5/10 in intensity.  The pain sometimes radiated  to the back but not always.  On arrival to the emergency room, the  patient had an elevated blood pressure of 192/76.  She had no associated  shortness of breath, nausea or dizziness.  She was given aspirin,  nitroglycerin and potassium replacement as her potassium was low with  3.4.  The patient was admitted to the ICU to rule out a myocardial  infarction.  EKG did not show any acute ST-T wave changes.  We are being  asked to see the patient secondary to cardiovascular risk factors and  complaints of chest pain.   REVIEW OF SYSTEMS:  Positive for chest pain reproducible on palpation  along with abdominal epigastric pain, numbness to the mouth, tongue,  left hand to elbow and abdominal pain.  All other systems are reviewed  and are found to be negative.   PAST  MEDICAL HISTORY:  1. Diabetes insulin dependent x10 years.  2. Hypertension x10 years.  3. TIA in 2009.  4. Carotid artery disease followed by Dr. Darrick Penna.   SOCIAL HISTORY:  The patient lives in [place] with her husband and  son.  She is a Futures trader.  She does not drink, smoke or use alcohol.   FAMILY HISTORY:  Mother with arthritis and hypertension.  Father  deceased from stomach cancer.  She has siblings with high blood  pressure.   CURRENT MEDICATIONS:  1. Low-molecular-weight heparin 40 mg daily.  2. Aspirin 325 mg daily.  3. Protonix 40 mg daily.  4. Metoprolol 50 mg b.i.d.  5. Zocor 20 mg at bedtime.  6. Insulin per sliding scale.  7. Benicar 40 mg daily.  8. Isosorbide XR 30 mg daily   ALLERGIES:  No known drug allergies.   CURRENT LABORATORY DATA:  Sodium 143, potassium 3.4, chloride 111, CO2  27, BUN 7, creatinine 0.65, glucose 161, hemoglobin 12.5, hematocrit  37.2, white blood cells 9.1,  platelets are 185.  TSH 1.192, D-dimer less  than 0.22, hemoglobin A1c 9.7.  Troponins are negative x3 at 0.03, 0.01  and 0.01, magnesium 2.0, phosphorus 3.1.  LFTs are within normal limits.  Chest x-ray revealing no acute abnormality.  EKG revealing sinus  bradycardia with ventricular rate of 57 beats per minute with no  ischemic changes.   CARDIOVASCULAR RISK FACTORS:  Hypertension, diabetes,  hypercholesterolemia and carotid artery disease which is coronary  equivalent.   PHYSICAL EXAMINATION:  VITAL SIGNS:  Blood pressure 185/64, pulse 73,  respirations 17, temperature 98, O2 sat 99% on room air.  GENERAL:  She is awake, alert and oriented.  No acute distress.  HEENT:  Head is normocephalic and atraumatic.  Eyes:  PERRLA.  Mucous  membranes mouth is pink and moist.  Tongue is midline.  NECK:  Supple.  There is no JVD.  There is soft carotid bruit on the  right.  CARDIOVASCULAR:  Regular rate and rhythm with S4 noted, 1/6 systolic  murmur as well.  LUNGS:  Clear to  auscultation without wheezes, rales or rhonchi.  ABDOMEN:  Obese.  There is some tenderness in epigastric area and right  and left upper quadrants on palpation with 2+ bowel sounds.  EXTREMITIES:  Without clubbing, cyanosis or edema.  NEURO:  Cranial nerves II-XII are grossly intact.   IMPRESSION:  1. Atypical chest pain.  2. Hypertension not well-controlled.  3. Diabetes not well-controlled.  4. Abdominal pain.  5. Known carotid artery disease.   PLAN:  A 65 year old African American female with cardiovascular risk  factors of diabetes, hypertension, hypercholesterolemia and carotid  artery disease admitted with midsternal chest pressure and epigastric  pain.  Cardiac enzymes are negative x3.  There is no acute EKG changes.  Blood pressure needs better control.  We will add low dose Norvasc.  Echocardiogram in the a.m. for LV function.  Consider stress Myoview  versus cardiac catheterization with known multiple cardiovascular risk  factors.  The patient will need aggressive treatment for  hypertension.  Dr. Daleen Squibb has discussed cardiac catheterization with the  patient and the patient agrees to proceed.  We will plan cardiac  catheterization this week once the patient's blood pressure is better  controlled.   We would like to thank Dr. Clent Ridges for allowing [place] to participate in the  care of this patient.      Bettey Mare. Lyman Bishop, NP      Jesse Sans. Daleen Squibb, MD, [facility]  Electronically Signed    KML/MEDQ  D:  12/12/2008  T:  12/13/2008  Job:  [id number]   cc:   Jeannett Senior A. Clent Ridges, MD

## 2010-11-21 NOTE — Cardiovascular Report (Signed)
[name], [name]             ACCOUNT NO.:  [id number]   MEDICAL RECORD NO.:  [id number]          PATIENT TYPE:  INP   LOCATION:  2503                         FACILITY:  [facility]   PHYSICIAN:  Veverly Fells. Excell Seltzer, MD  DATE OF BIRTH:  July 29, 1945   DATE OF PROCEDURE:  12/15/2008  DATE OF DISCHARGE:                            CARDIAC CATHETERIZATION   PROCEDURES:  1. Percutaneous transluminal coronary angioplasty and stenting of the      right coronary artery.  2. Fractional flow reserve of the left anterior descending and second      diagonal.   PROCEDURAL INDICATIONS:  [name] is a 65 year old woman, who was  hospitalized for chest pain.  Her story was consistent with unstable  angina.  She underwent diagnostic catheterization by Dr. Gala Romney that  showed severe 95% stenosis of the mid right coronary artery.  She also  had moderate stenosis of the LAD and a large second diagonal branch.  We  discussed treatment options and elected to treat her right coronary  artery with percutaneous intervention and further assess her LAD  diagonal branch for ischemia with fractional flow reserve to see if  those moderate lesions were hemodynamically significant.   Risks and indications of the procedure were reviewed with the patient.  Informed consent was obtained.  The right radial area was prepped and  draped under normal sterile technique.  Using the modified Seldinger  technique, a 6-French sheath was inserted into the right radial artery  without difficulty.  Intra-arterial verapamil was given.  A 6-French JR4  guide catheter was inserted.  The patient was given 6500 units of  unfractionated heparin.  She had been preloaded on Plavix.  Once a  therapeutic ACT was achieved, a Cougar guidewire was used to wire the  severe stenosis in the mid right coronary artery.  The vessel was  predilated with a 2.5 x 15 mm balloon, which was taken to 12 atmospheres  and appeared well expanded.   Following predilatation, the vessel was  stented with a 3.0 x 15 mm XIENCE drug-eluting stent.  The stent  appeared well expanded and it was deployed at 16 atmospheres.  Following  stenting, I elected to postdilate the vessel with a 3.25 x 12 mm [place]  Quantum apex balloon, which was inflated to 16 atmospheres.  At the  completion of the procedure, there was an excellent angiographic result  with a well-expanded stent and TIMI 3 flow in the vessel.  The JR4 guide  was then changed out over an exchange-length Rosen wire for a XB 3-mm  guide for the left coronary system.  Initial imaging was done and  fractional flow reserve wire was passed into the mid LAD beyond the area  of concern in the proximal vessel.  In the angiography, there was an  approximate 50% stenosis.  The graduated doses of intracoronary  adenosine were given to a maximum of 48 mcg.  The FFR was 0.91.  The  wire was then pulled back and advanced into the second diagonal branch  across the 70% stenosis.  Adenosine was again given up to 48  mcg and the  FFR was 0.86.  These values do not meet criteria for stenting and the  wire was removed.  Final angiogram was performed and showed stable  coronary anatomy.  The guide catheter was removed over a wire.  The  patient tolerated the procedure well.  A TR band was placed over the  radial arteriotomy.  At the completion of the procedure, there were no  immediate complications.     Veverly Fells. Excell Seltzer, MD  Electronically Signed    MDC/MEDQ  D:  12/15/2008  T:  12/16/2008  Job:  [id number]   cc:   Tera Mater. Evlyn Kanner, M.D.

## 2010-11-21 NOTE — Discharge Summary (Signed)
[name], [name]             ACCOUNT NO.:  [id number]   MEDICAL RECORD NO.:  [id number]          PATIENT TYPE:  INP   LOCATION:  2503                         FACILITY:  [facility]   PHYSICIAN:  Gordy Savers, MDDATE OF BIRTH:  November 06, 1945   DATE OF ADMISSION:  12/11/2008  DATE OF DISCHARGE:  12/16/2008                               DISCHARGE SUMMARY   DISCHARGE DIAGNOSES:  1. Chest pain in setting of coronary artery disease status post      percutaneous coronary intervention of right coronary artery on December 15, 2008 performed by Dr. Tonny Bollman.  2. Diabetes type 2 uncontrolled.  3. Hypertension uncontrolled.  4. Hyperlipidemia.  5. Obesity.   HISTORY OF PRESENT ILLNESS:  [name] is a 65 year old African  American female who was admitted on December 11, 2008 with chief complaint of  chest pain.  Her past medical history is significant for diabetes and  hypertension.  She was treated with insulin for her diabetes and also  had a history of TIA approximately 1 year ago.  Since that time she has  been treated with Aggrenox.  She presented with substernal pressure.  She noted that she had a stress test 2-3 years ago which was apparently  unremarkable.  She was admitted for further evaluation and treatment.   PAST MEDICAL HISTORY:  1. Diabetes type 2.  2. Hypertension.  3. TIA.  4. History of back surgery with slight foot numbness.  5. Hysterectomy.  6. Bilateral carpal tunnel surgery.  7. Surgery of the right foot.   COURSE OF HOSPITALIZATION:  1. Chest pain in setting of coronary artery disease.  The patient was      admitted.  She underwent serial cardiac enzymes which were      negative.  She subsequently underwent cardiac catheterization which      was performed on December 13, 2008 by Dr. Arvilla Meres.  This noted      a two-vessel coronary artery disease with normal LV function.  Her      Aggrenox was discontinued and she was placed on Plavix.  She then   underwent successful PCI of the RCA performed on December 15, 2008 by      Dr. Tonny Bollman.  At this time we are awaiting a set of cardiac      enzymes where she had a brief fleeting episode of chest discomfort      this morning.  At this time, we plan to discharge her to home if      negative or if symptoms are resolved.  2. Diabetes type 2.  [name] diabetes is currently uncontrolled.      Her hemoglobin A1c was 9.7.  Her blood sugars here in the hospital      have been running in the high 100 to low 200s.  Overall at this      time we will increase her home Lantus regimen from 20 units subcu      b.i.d. to 23 units subcu b.i.d.  She is instructed to contact Dr.  Clent Ridges should she have sugars greater than 250 or less than 80.  3. Dyslipidemia.  The patient was placed on a statin during this      admission.   MEDICATIONS AT THE TIME OF DISCHARGE:  1. Aspirin 325 mg p.o. daily.  2. Plavix 75 mg p.o. daily.  3. Coreg 12.5 mg p.o. b.i.d.  4. Avapro 300 mg p.o. daily.  5. Norvasc 10 mg p.o. daily.  6. Crestor 40 mg p.o. daily.  7. Imdur 30 mg p.o. daily.  8. Lantus 23 units subcu q.12 hours.  9. Nitroglycerin 0.4 mg sublingual as needed p.r.n. chest pain.   PERTINENT LABORATORY DATA AT THE TIME OF DISCHARGE:  Hemoglobin A1c is  6.9.  Urine culture December 14, 2007 showed 3000 colonies of insignificant  growth, BUN 9, creatinine 0.85, glucose 228.  Sodium 139, potassium 3.6,  hemoglobin 11.7, hematocrit 34.7, white blood cell count 7.2, platelets  172.  TSH normal.   DISPOSITION:  She will be discharged to home on a diabetic, low-sodium  heart-healthy diet.  She is instructed to keep her cath site clean and  dry.  In addition, she is to follow up with Dr. Valera Castle on December 28, 2008 at 3:45 p.m. and with Dr. Gershon Crane on December 29, 2008 at 11 a.m.   Greater than 30 minutes were spent on discharge planning.      Sandford Craze, NP      Gordy Savers, MD   Electronically Signed    MO/MEDQ  D:  12/16/2008  T:  12/16/2008  Job:  [id number]   cc:   Veverly Fells. Excell Seltzer, MD  Jesse Sans. Daleen Squibb, MD, [facility]  Jeannett Senior A. Clent Ridges, MD

## 2010-11-21 NOTE — Procedures (Signed)
CAROTID DUPLEX EXAM   INDICATION:  Carotid disease.   HISTORY:  Diabetes:  Yes.  Cardiac:  Stent.  Hypertension:  Yes.  Smoking:  Previous.  Previous Surgery:  No.  CV History:  Currently asymptomatic.  Amaurosis Fugax No, Paresthesias No, Hemiparesis No                                       RIGHT             LEFT  Brachial systolic pressure:         180               176  Brachial Doppler waveforms:         Normal            Normal  Vertebral direction of flow:        Antegrade         Antegrade  DUPLEX VELOCITIES (cm/sec)  CCA peak systolic                   98                72  ECA peak systolic                   80                82  ICA peak systolic                   238               80  ICA end diastolic                   70                25  PLAQUE MORPHOLOGY:                  Mixed             Mixed  PLAQUE AMOUNT:                      Moderate          Mild  PLAQUE LOCATION:                    ICA / ECA         ICA / ECA   IMPRESSION:  1. 60%-79% stenosis of the right internal carotid artery.  2. 1%-39% stenosis of the left internal carotid artery.  3. No significant change noted when compared to the previous exam on      12/08/2008.   ___________________________________________  Janetta Hora. Fields, MD   CH/MEDQ  D:  06/08/2009  T:  06/09/2009  Job:  [id number]

## 2010-11-21 NOTE — H&P (Signed)
[name], [name]             ACCOUNT NO.:  [id number]   MEDICAL RECORD NO.:  [id number]          PATIENT TYPE:  INP   LOCATION:  2312                         FACILITY:  [facility]   PHYSICIAN:  Darryl D. Prime, MD    DATE OF BIRTH:  06-07-1946   DATE OF ADMISSION:  12/11/2008  DATE OF DISCHARGE:                              HISTORY & PHYSICAL   Patient is full code.   PRIMARY CARE PHYSICIAN:  Jeannett Senior A. Clent Ridges, MD, [facility].   CHIEF COMPLAINT:  Chest pain.   HISTORY OF PRESENT ILLNESS:  [name] is a 65 year old female with  a history of diabetes and hypertension for greater than 10 years.  She  is on insulin for her diabetes.  She has a history of a TIA apparently  with mouth numbness on the left year or so ago and since then has been  on Aggrenox.  She presents with substernal chest pressure.  This is new  for her.  She is never had this before.  She did have a stress test  approximately 2 to 3 years ago that was unremarkable.  She was  asymptomatic at the time before the study.  This is all history per the  patient.  She apparently had, after lunch time (she did not eat),  did  have substernal chest pressure, a heaviness sensation, with radiation to  the right arm and hand, with associated numbness and tingling and also  numbness and tingling jaw discomfort on the left side.  She notes no  associated shortness of breath, sweats or nausea and  vomiting.  Pain at  its worst is in the range of 3/10, off and on for hours between 12 p.m.  to 6 p.m., when she presented to the emergency room.  In the emergency  room, she was given aspirin, nitro paste, potassium chloride as well in  addition to IV fluids with near resolution of the discomfort.  The  patient's blood pressure on admission was in the range of 199 systolic,  unsure of what her baseline blood pressure is.  She did take her blood  pressure medicine this morning.  She denies any cough, fever, diarrhea,  constipation, black stools or bloody stools.   PAST MEDICAL AND SURGICAL HISTORY:  1. History of diabetes and hypertension as above.  2. History of routine stress test as above, negative, 2-3 years ago      per the patient.  3. She has a history of possible TIA as above.  4. History of back surgery with left foot numbness after the back      surgery, medial aspect of her foot since January 2008.  5. History of hysterectomy.  She thinks she has one ovary in place      still.  6. She has a history of bilateral carpal tunnel surgery.  7. Foot surgery on the right.   ALLERGIES:  No known drug allergies.   MEDICATIONS:  1. Lantus 20 units twice a day.  2. Verapamil she is unsure of the dose.  3. Avapro 150 mg daily.  4. Aggrenox 25/200 ER  twice a day.   SOCIAL HISTORY:  The patient never smoked and did not drink alcohol. No  illicit drug use.   FAMILY HISTORY:  Negative for premature coronary artery disease.   REVIEW OF SYSTEMS:  A 14-point review of systems negative unless stated  above.   PHYSICAL EXAMINATION:  VITAL SIGNS: Temperature is 98.4 with a blood  pressure 192/76, pulse of 59, respirations 16, saturations 100% in room  air.  GENERAL:  She is a female of stated age sitting upright in bed in no  acute distress.  HEENT: Normocephalic, atraumatic.  Pupils equal, round, react to light  with extraocular movements being intact.  Oropharynx is dry with no  posterior pharyngeal lesions.  NECK: Supple with no lymphadenopathy or thyromegaly.  No carotid bruits.  No jugular venous distention.  LUNGS: Clear to auscultation bilaterally.  CARDIOVASCULAR: Regular rhythm and rate with no murmurs, rubs, gallops,  normal S1 and S2, no S3 or S4.  ABDOMEN: Soft, nontender, nondistended.  No splenomegaly, obesity is  noted.  EXTREMITIES: No clubbing, cyanosis.  She has trace 1+ lower extremity  edema bilaterally.  LYMPHATIC EXAM:  As above with no anterior cervical lymphadenopathy.   She does have lower extremity edema.  SKIN:  Shows no rashes or ulcers.  MUSCULOSKELETAL EXAM:  Free range of motion with no signs of synovitis.  PSYCHIATRIC EXAM:  Normal mood and affect, alert and oriented x4.   LABORATORY DATA:  Sodium 141 with potassium 2.9, chloride 107, bicarb  29, BUN 9, creatinine 0.7, glucose 98, otherwise normal LFTs.  White  count is 10.9 with hemoglobin of 14. Hematocrit 41.5 platelets 208, segs  62%.  Chest x-ray showed poor inspiratory effort, low timing with no  acute cardiopulmonary disease.  No prior chest x-ray to compare.  EKG  shows sinus bradycardia with a ventricular rate of 59 beats per minute,  PR interval 176, QRS 94, QT corrected 424 with no prior EKG available.   ASSESSMENT AND PLAN:  This is a patient with a history of diabetes and  hypertension, longstanding, who presents with chest pain concerning for  acute coronary syndrome plus or minus hypertensive urgency, rule out  pulmonary embolus.   At this time, she will be admitted to telemetry or step-down depending  on how her blood pressure does in the ER here.  We will give her 3  sublingual nitroglycerins now and place her on nitroglycerin drip.  If  this needs to be titrated, she may have to go to a high level room.  The  patient will be started on Toprol XL as well to control the blood  pressure in addition to Avapro.  The patient will be on aspirin.  Will  get cardiac markers x3.  She will be on telemetry.  We will place her  n.p.o. for possible assessment of coronary blood flow if she were to  rule in or at some point here soon if she does not rule in . Will check  a D-dimer but in the meantime DVT and GI prophylaxis will be ordered.  We will repeat her potassium.  For her stroke history and diabetes, will  check lipid panel and place her on simvastatin.  She is also having  chest pain concerning for unstable angina in which has a statin is  certainly reasonable.  Will not start heparin  or anticoagulation for  now, given her very high blood pressures and current resolution of  symptoms.      Darryl  D. Prime, MD  Electronically Signed     DDP/MEDQ  D:  12/12/2008  T:  12/12/2008  Job:  [id number]

## 2010-11-21 NOTE — Op Note (Signed)
NAMECAMELLIA, [name]             ACCOUNT NO.:  [id number]   MEDICAL RECORD NO.:  [id number]          PATIENT TYPE:  AMB   LOCATION:  SDS                          FACILITY:  [facility]   PHYSICIAN:  Juleen China IV, MDDATE OF BIRTH:  [birth year]   DATE OF PROCEDURE:  01/06/2008  DATE OF DISCHARGE:  01/06/2008                               OPERATIVE REPORT   PREOPERATIVE DIAGNOSIS:  Transient ischemic attack.   POSTOPERATIVE DIAGNOSIS:  Transient ischemic attack.   PROCEDURES PERFORMED:  1. Ultrasound access of right common femoral artery.  2. Arch angiogram.  3. Second order catheterization (right carotid artery).  4. Right carotid angiogram.  5. Right femoral angiogram.  6. StarClose.   PROCEDURE:  The patient was identified in the holding area and taken to  room 8.  She was placed supine on the table.  Bilateral groins were  prepped and draped in a standard sterile fashion.  A time-out was  called.  The right common femoral artery was evaluated with ultrasound  and was found to be widely patent.  Lidocaine 1% was used for local  anesthesia.  Using ultrasound, the right common femoral was accessed  with an 18-gauge needle.  An 0.35 Bentson wire was advanced in  retrograde fashion into the abdominal aorta under fluoroscopic  visualization.  Next, a 5-French sheath was placed.  Over the wire, a  pigtail catheter was advanced into ascending aorta and an arch angiogram  was obtained.  Next, using a Berenstein II catheter, the right common  carotid artery was selected and a right carotid angiogram was obtained  with intracerebral pictures.  The intracerebral images will be  interpreted by neuroradiology.   FINDINGS:  Aortic arch:  A type 2 aortic arch is visualized.  The  innominate artery is widely patent with no evidence of stenoses.  The  right subclavian artery is widely patent with no evidence of stenoses.  The right vertebral artery originates from the right subclavian  artery  and is widely patent.  The visualized portions of the right common  carotid artery appeared widely patent.  The visualized portions of the  left common carotid artery are widely patent.  The carotid bifurcation  of the left is without disease.  The left subclavian artery is widely  patent with minimal disease.  The left vertebral artery originates from  the left subclavian artery and is widely patent.   Right carotid angiogram:  The right common carotid artery is widely  patent.  Multiple branches to the external carotid artery are  visualized.  The external carotid appears to be widely patent as are its  branches.  There is approximately 50% stenosis found just distal to the  carotid bifurcation in the internal carotid artery.  There is no  ulceration, and minimal plaque associated with this lesion.   After the above images were obtained, decision was made to terminate the  procedure.  Catheters and wires were removed.  A retrograde right  femoral artery angiogram was performed to evaluate for closure.  The  patient was a  good candidate, and a StarClose was successfully  deployed.  There were  no complications.  The patient was taken to the holding area in stable  condition.   IMPRESSION:  50% right internal carotid stenoses without ulceration.           ______________________________  V. Charlena Cross, MD  Electronically Signed     VWB/MEDQ  D:  01/06/2008  T:  01/06/2008  Job:  [id number]

## 2010-11-21 NOTE — Cardiovascular Report (Signed)
NAMESHENIECE, RUGGLES             ACCOUNT NO.:  [id number]   MEDICAL RECORD NO.:  [id number]          PATIENT TYPE:  INP   LOCATION:  2010                         FACILITY:  [facility]   PHYSICIAN:  Bevelyn Buckles. Bensimhon, MDDATE OF BIRTH:  Dec 02, 1945   DATE OF PROCEDURE:  12/13/2008  DATE OF DISCHARGE:                            CARDIAC CATHETERIZATION   PATIENT IDENTIFICATION:  [name] is a delightful 65 year old woman  with a history of diabetes, hypertension.  She was admitted with chest  pain with both typical and atypical features.  Cardiac markers were  normal.  She is referred for cardiac catheterization.   PROCEDURES PERFORMED:  1. Selective coronary angiography.  2. Left heart cath.  3. Left ventriculogram.  4. StarClose right femoral artery closure.   DESCRIPTION OF PROCEDURE:  The risks and indications were explained,  consent was signed and placed on the chart.  A 5-French arterial sheath  was placed in the right femoral artery using a modified Seldinger  technique.  Standard catheters including a JL-4, JR-4, and angled  pigtail were used, all catheter exchanges made over a wire, there were  no apparent complications.   1. Central aortic pressure 126/65 with a mean of 88.  LV 139/11 with      an EDP of 18.  There was no aortic stenosis.  2. The left main was normal.  3. The LAD was a long vessel coursing to the apex.  It gave off 2      diagonals.  In the proximal to mid section, there was a long 50%      tubular stenosis.  This spanned both diagonals.  In the midsection,      there was a 30% stenosis.  In the second diagonal, which was a      large vessel, there was a 60% ostial lesion followed by an 80%      lesion in the midportion.  4. The left circumflex was a moderate-sized vessel, gave off a large      OM-1 and a small OM-2.  There was a 30% lesion in the mid AV groove      circ.  5. The right coronary artery was a large dominant vessel, gave off an      RV  branch, a large PDA, and a posterolateral.  There was a focal      95% lesion in the mid section of the RCA just after the takeoff of      the RV branch.   Left ventriculogram done in the RAO projection showed an EF of 65 to  70%.  There was no regional wall motion abnormalities.   ASSESSMENT:  1. Two-vessel coronary artery disease as described above.  2. Normal left ventricular (LV) function.   Plan will be for percutaneous intervention on the mid RCA tomorrow  plus/minus the second diagonal.  We will stop her Aggrenox and load her  on Plavix.      Bevelyn Buckles. Bensimhon, MD  Electronically Signed     DRB/MEDQ  D:  12/13/2008  T:  12/13/2008  Job:  [id number]

## 2010-11-21 NOTE — Consult Note (Signed)
[name], [name]             ACCOUNT NO.:  [id number]   MEDICAL RECORD NO.:  [id number]          PATIENT TYPE:  INP   LOCATION:  3010                         FACILITY:  [facility]   PHYSICIAN:  Janetta Hora. Fields, MD  DATE OF BIRTH:  1945-08-04   DATE OF CONSULTATION:  01/01/2008  DATE OF DISCHARGE:                                 CONSULTATION   REQUESTING PHYSICIAN:  Valerie A. Felicity Coyer, MD   REASON FOR CONSULTATION:  Possible TIA with moderate right internal  carotid artery stenosis.   HISTORY OF PRESENT ILLNESS:  The patient is a 65 year old female with  atherosclerotic risk factors of elevated cholesterol, hypertension, and  diabetes.  She has a remote history of smoking and smoked for 2 years,  when she was 65 years old, but has not smoked since then.   On the afternoon of December 30, 2007, she had been taking a walk when, all  of a sudden, she noticed that she was having numbness and tingling in  her left upper lip, as well as some numbness and tingling in her left  hand and fingers.  This episode lasted for approximately 10 minutes and  then resolved.  She went to the [facility] Emergency Room for further  evaluation.  Since that time, she has been having approximately 4  episodes per day that last approximately minutes over the last 3 days.  She usually has numbness of her left upper lip and some numbness of her  tongue.  She also has numbness of her left fingers.  She states that  usually both of these occurred together, but she states that  occasionally, sometimes it will be just the fingers, and sometimes  occasionally just the tongue and left upper lip.  She denies prior  history of stroke.  She has no family history of stroke.   She had a CT scan of the head and MRI of the head during this hospital  admission, which showed no evidence of infarct.  She had a carotid  Doppler exam performed on December 31, 2007, which showed a right internal  carotid artery peak systolic  velocity of 244 cm/sec with an end-  diastolic velocity of 72 cm/sec.  The left internal carotid artery had  no significant ICA stenosis.  She had bilateral antegrade vertebral  flow.   PAST MEDICAL HISTORY:  Asthma, GERD, hypertension, low back pain,  hyperlipidemia, nasal sarcoidosis, depression, diabetes, fibromyalgia,  and lumbar stenosis.   PAST SURGICAL HISTORY:  She had lumbar spine surgery, hysterectomy, and  cholecystectomy.   FAMILY HISTORY:  As mentioned above.   SOCIAL HISTORY:  She denies any use of alcohol or drugs.  Smoking  history is as above.  She is married.   REVIEW OF SYSTEMS:  She denies shortness of breath or chest pain.  She  states that she has had some increased lower extremity edema recently.  She denies history of renal dysfunction.  She denies history of GI  bleeding.  She has no history of other neurologic events.  She has no  history of myocardial infarction.   PHYSICAL EXAMINATION:  VITAL SIGNS:  Blood pressure is 161/70, pulse 67  and regular, temperature 98.9, respirations 20, and oxygen saturation is  97% on room air.  HEENT:  No significant external findings.  NECK:  Has 2+ carotids without bruit.  CHEST:  Clear to auscultation.  CARDIAC:  Regular rate and rhythm without murmur.  ABDOMEN:  Soft, slightly obese, nontender, and nondistended.  No masses.  EXTREMITIES:  She has 2+ femoral pulses bilaterally.  She has trace  edema in both lower extremities.  She has 2+ brachial and radial pulses  bilaterally.  She has no supra or infraclavicular bruits.  She has 2+  subclavian pulses supraclavicularly.  NEUROLOGIC:  She is alert and oriented x3.  Upper extremity and lower  extremity motor strength is 5/5.  She has no decreased sensation over  the left mouth or left hand at this point.  She has no pronator drift.   LABORATORY DATA:  Hemoglobin A1c was 12.8 on December 31, 2007.  White blood  cell count 10, hemoglobin 15, and platelets 178, BUN 11, and  creatinine  0.77.   She had an echocardiogram performed on December 31, 2007, which showed an  ejection fraction of 60% and mildly calcified aortic valve.   EKG on June 24, 2006, showed sinus bradycardia.   ASSESSMENT:  Possible transient ischemic attack involving left face and  left fingers.  However, her signs and symptoms and presentation would be  a little bit unusual as far as intracerebral distribution and  consistency, especially with the number of events that she is reporting.  However, she does have evidence of a moderate right internal carotid  artery stenosis by duplex exam.  I believe, the best option for her  would be a carotid arteriogram to further define the level of stenosis  on the right side and also determine if she has evidence of ulcerative  plaque.  I described her today the procedure details, risks, benefits,  and possible complications including, but not limited to bleeding,  infection, contrast reaction, and stroke.  She understands and agrees to  proceed.  We have scheduled for carotid angiogram for Tuesday, January 06, 2008, at [facility] by my partner Dr. Amedeo Gory. Fields, MD  Electronically Signed     CEF/MEDQ  D:  01/01/2008  T:  01/02/2008  Job:  [id number]

## 2010-11-24 NOTE — Discharge Summary (Signed)
NAMEJACELYN, [name]             ACCOUNT NO.:  [id number]   MEDICAL RECORD NO.:  [id number]          PATIENT TYPE:  INP   LOCATION:  3023                         FACILITY:  [facility]   PHYSICIAN:  Coletta Memos, M.D.     DATE OF BIRTH:  09-20-1945   DATE OF ADMISSION:  07/31/2006  DATE OF DISCHARGE:  08/05/2006                               DISCHARGE SUMMARY   PREOPERATIVE DIAGNOSES/ADMITTING DIAGNOSES:  1. Lumbar stenosis to L4-5.  2. Lumbar spondylolisthesis L4-5.  3. Lumbar spondylosis.   DISCHARGE DIAGNOSES:  1. Lumbar stenosis to L4-5.  2. Lumbar spondylolisthesis L4-5.  3. Lumbar spondylosis.   PROCEDURE:  Posterior lumbar interbody arthrodesis, 13 mm Synthes PLIF  cages, lumbar decompression, pedicle screw nonsegmental fixation L4-5  with Legacy set.   COMPLICATIONS:  None.   SURGEON:  Coletta Memos, M.D.   DISCHARGE DESTINATION:  Home.   MEDICATIONS:  Percocet and Flexeril.   STATUS:  Alive and well.   Patient is alert and oriented x4, answering all questions appropriately.  She has normal strength in the lower extremities.  Wound is clean, dry  without signs of infection at discharge.  She has done well with her  hospitalization, having had the pain and some relief now.  I will see  her back in the office in approximately 1 month.           ______________________________  Coletta Memos, M.D.     KC/MEDQ  D:  08/05/2006  T:  08/06/2006  Job:  [id number]

## 2010-11-24 NOTE — Consult Note (Signed)
[name], [name]             ACCOUNT NO.:  [id number]   MEDICAL RECORD NO.:  [id number]          PATIENT TYPE:  AMB   LOCATION:  SDS                          FACILITY:  [facility]   PHYSICIAN:  [name], Karen OF BIRTH:  08-03-1945   DATE OF CONSULTATION:  DATE OF [name]:  01/06/2008                                 CONSULTATION   REFERRING PHYSICIAN:  1. Durene Cal IV, MD   REASON FOR CONSULTATION:  Intracranial interpretation of right internal  carotid arteriogram.   FINDINGS:  An AP and lateral projection are performed from the patient's  right common carotid injection of a carotid arteriogram performed on  January 06, 2008.  These images demonstrate near simultaneous opacification  of the internal and external carotid systems at the level of the skull  base.  This suggests there may be a significant proximal stenosis.  There is no significant intracranial stenosis, aneurysm, or branch  vessel occlusion.  There is flash filling of the posterior communicating  artery and posterior cerebral artery.   IMPRESSION:  1. No significant intracranial stenosis, aneurysm, or branch vessel      occlusion.  2. Question significant proximal internal carotid artery stenosis with      simultaneous opacification of the internal and external carotid      systems at the level of the skull base.      Karen Roberts, MD  Electronically Signed     CM/MEDQ  D:  01/13/2008  T:  01/14/2008  Job:  [id number]

## 2010-11-24 NOTE — Op Note (Signed)
NAME:  [name], [name]                       ACCOUNT NO.:  [id number]   MEDICAL RECORD NO.:  [id number]                   PATIENT TYPE:  OBV   LOCATION:  0478                                 FACILITY:  [facility]   PHYSICIAN:  Anselm Pancoast. Zachery Dakins, M.D.          DATE OF BIRTH:  07/15/1945   DATE OF PROCEDURE:  02/23/2003  DATE OF DISCHARGE:                                 OPERATIVE REPORT   PREOPERATIVE DIAGNOSIS:  Chronic cholecystitis with stones.   POSTOPERATIVE DIAGNOSIS:  Chronic cholecystitis with stones.   OPERATION:  Laparoscopic cholecystectomy with cholangiogram.   SURGEON:  Anselm Pancoast. Zachery Dakins, M.D.   ASSISTANT:  Sharlet Salina T. Hoxworth, M.D.   ANESTHESIA:  General.   HISTORY:  [name] is a 65 year old female, who was referred to me  for symptomatic gallstones.  The patient, approximately a month ago, had an  episode of severe pain that occurred in the epigastric area after going to a  church picnic.  She was seen in the emergency room where they did laboratory  studies and an x-ray that confirmed gallstones.  She had a questionable  pulmonary problem and has been seen by Casimiro Needle B. Wert, M.D. and was cleared  for elective cholecystectomy.  She has not had any significant episodes of  pain in the last few weeks but says she sometimes does have pain kind of in  the upper small portion of her back.   DESCRIPTION OF PROCEDURE:  Preop, she was given 3 g of Unasyn and has PAS  stockings and taken to the operative suite, induction of general anesthesia.  The patient is still fairly heavy, but she has lost 50 pounds with dietary  management.  A small incision was made below the umbilicus and down through  the adipose tissue, the fascia identified, and it was kind of picked up.  It  was kind of difficult getting into the peritoneal cavity because of fairly  tense fascia and, of course, she is still pretty deep in the umbilicus.  The  peritoneum, posterior, was  identified and a small opening made and a  pursestring suture of 0 Vicryl placed.  Gallbladder was tense but not  acutely inflamed.  There were some adhesions more proximally that were kind  of carefully taken down.  The upper 10 mm trocar was placed under direct  vision after anesthetizing the fascia, and Dr. Johna Sheriff placed the two  lateral 5 mm trocars at the appropriate lateral position.  The peritoneum  was opened.  The cystic duct was encompassed with a right-angle retractor  and clipped, a small opening made, a Cook catheter introduced, and an x-ray  obtained which showed good, prompt filling in the extrahepatic biliary  system, fairly prominent length of the cystic duct.  We then clipped the  cystic duct triply proximally and divided it.  And I had already clipped it  distally.  Then the cystic artery was identified, and this was  doubly  clipped proximally, singly distally, and divided, and then the gallbladder  was freed from its bed with the hook electrocautery.  The gallbladder was  freed with the hood electrocautery.  I did place an EndoCatch bag.  There  was a little bit of bile spillage when we were separating the gallbladder  from the final attachments of the liver.  Good hemostasis had been obtained.  No further bile, no evidence of any bleeding.  The gallbladder was then  brought out through the umbilical fascia within the EndoCatch bag.  The  pursestring suture that was previously placed was tied, and a second figure-  of-eight of 0 Vicryl was placed.  I then anesthetized the fascia at the  umbilicus with Marcaine also.  The irrigating fluid was aspirated.  No  evidence of any bleeding or bile.  The 5 mm ports were withdrawn, and then  the upper 10 mm ports were withdrawn after removal of the carbon dioxide.  The subcutaneous wounds were closed with 4-0 Vicryl and then Benzoin and  Steri-Strips on the skin.  The patient  should be ready for discharge in the a..m. and  hopefully will have no  further problems.  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  The primary physician is  Jeannett Senior A. Clent Ridges, M.D.                                               Anselm Pancoast. Zachery Dakins, M.D.    WJW/MEDQ  D:  02/23/2003  T:  02/23/2003  Job:  [id number]   cc:   Jeannett Senior A. Clent Ridges, M.D. [facility]

## 2010-11-24 NOTE — Op Note (Signed)
[name], [name]             ACCOUNT NO.:  [id number]   MEDICAL RECORD NO.:  [id number]          PATIENT TYPE:  INP   LOCATION:  3172                         FACILITY:  [facility]   PHYSICIAN:  Coletta Memos, M.D.     DATE OF BIRTH:  December 31, 1945   DATE OF PROCEDURE:  07/31/2006  DATE OF DISCHARGE:                               OPERATIVE REPORT   PREOPERATIVE DIAGNOSIS:  Lumbar stenosis L4-5, lumbar spondylolisthesis  L4-5, lumbar spondylosis L4-5, lumbar radiculopathy.   POSTOPERATIVE DIAGNOSES:  Lumbar stenosis L4-5, lumbar spondylolisthesis  L4-5, lumbar spondylosis L4-5, lumbar radiculopathy.   PROCEDURE:  1. Posterior lumbar interbody arthrodesis with 13 mm x 2 [employer] PLIF      cages filled with morselized autograft.  2. Lumbar decompression L4-5.  3. Posterolateral arthrodesis L4-5 with morselized auto and allograft.  4. Pedicle screw nonsegmental fixation L4-L5 with four 45 mm screws,      one 5.5 screw at L-5 on the left.  The other were 6.5 x 45-mm      screws.   COMPLICATIONS:  None.   SURGEON:  Coletta Memos, M.D.   ASSISTANT:  Payton Doughty, M.D.   INDICATION:  This is a 65 year old woman who presented with pain in both  hips and both lower extremities.  This was worse when she is stood.  MRI  showed significant facette arthropathy at that level, subsequent lumbar  stenosis and the lumbar spondylolisthesis.  I therefore recommended and  she agreed to undergo operative decompression, arthrodesis and pedicle  screw fixation.   OPERATIVE NOTE:  [name] was brought to the operating room  intubated and placed under a general anesthetic without difficulty.  She  had a Foley catheter placed under sterile conditions.  She was rolled  prone onto body rolls and all pressure points were properly padded.  Her  back was prepped and she was draped in a sterile fashion.  I infiltrated  20 mL 0.5% lidocaine with 1:200,000 epinephrine into the lumbar region.  I opened the  skin with a #10 blade.  I took this down to the  thoracolumbar fascia.  I then exposed the lamina of L-4, L-5 and L-3.  I  took an intraoperative x-ray to localize and once I did that I then  exposed the transverse processes bilaterally of L-5 and of L-4.  I then  used a Leksell rongeur to take down the facettes so that frankly would  not get in the way of me working.  They were very much enlarged, very  arthritic and gnarly.  I then performed semi-hemilaminectomies of L-4  bilaterally using a high-speed drill.  I removed very thickened  ligamentum flavum bilaterally.  I then decompressed the thecal sac  bilaterally going out over the L-4 nerve roots bilaterally and out into  the neural foramina of the L-5 roots bilaterally.  After thorough  decompression of the spinal canal, I then turned my attention towards  the posterior lumbar interbody arthrodesis.  I opened the disk space and  removed the disk material using pituitary rongeurs.   I further prepared the disk space by using  curettes, using special  shavers provided by [employer] for preparation of the endplates and disk  space.  After thorough removal of disk in preparation of the endplates I  then placed some vitos into the disk space.  I then packed two 13 mm  cages and placed those, one on the right, one on the left, protecting  both L4 nerve roots.  After that was done an AP x-ray was taken and  showed the PLIF cages to be in good position.  I then proceeded with  pedicle screw fixation using fluoroscopy.   With fluoroscopy using both AP and lateral views I placed four screws,  two screws at L-4, two screws at L-5, 5.5 x 45 mm screw at L-5 on the  left side.  Otherwise the others were 6.5 x 45 mm.  I used a 5/5 tap at  each level after identifying the pedicle and using a pedicle probe to  start.  I then used a tap to cut out the hole for the screw.  I then  placed four screws without difficulty.   I then decorticated the  transverse processes of L-4 and of L-5 and  placed both morselized auto and allograft laterally and posteriorly.  After the posterolateral arthrodesis was performed I then turned my  attention to securing the screws with rods.  I did that without  difficulty.  With Dr. Temple Pacini assistance we then closed the wound in  layered fashion.  Dr. Channing Mutters did assist with the PLIF, the posterolateral  arthrodesis and the pedicle screw placement.  I then closed the wound in  a layered fashion using Vicryl sutures, reapproximating the  thoracolumbar fascia, subcutaneous tissues in subcuticular layers.  Steri-Strips were placed.  Sterile dressing was also placed.  The  patient tolerated procedure well.           ______________________________  Coletta Memos, M.D.     KC/MEDQ  D:  07/31/2006  T:  07/31/2006  Job:  [id number]

## 2010-12-05 ENCOUNTER — Other Ambulatory Visit: Payer: Self-pay | Admitting: Nurse Practitioner

## 2010-12-11 ENCOUNTER — Encounter: Payer: Self-pay | Admitting: Cardiovascular Disease

## 2010-12-13 ENCOUNTER — Other Ambulatory Visit: Payer: Self-pay | Admitting: *Deleted

## 2010-12-13 NOTE — Telephone Encounter (Signed)
Left message for patient that Plavix has arrived. Plavix placed at front desk for pick up. Order # [id number]. Customer PO# [id number]. Vikki Ports

## 2011-01-02 ENCOUNTER — Encounter: Payer: Self-pay | Admitting: Cardiovascular Disease

## 2011-01-02 ENCOUNTER — Ambulatory Visit (INDEPENDENT_AMBULATORY_CARE_PROVIDER_SITE_OTHER): Payer: Medicare Other | Admitting: Cardiovascular Disease

## 2011-01-02 DIAGNOSIS — I10 Essential (primary) hypertension: Secondary | ICD-10-CM

## 2011-01-02 DIAGNOSIS — I2510 Atherosclerotic heart disease of native coronary artery without angina pectoris: Secondary | ICD-10-CM

## 2011-01-02 DIAGNOSIS — I119 Hypertensive heart disease without heart failure: Secondary | ICD-10-CM

## 2011-01-02 DIAGNOSIS — E785 Hyperlipidemia, unspecified: Secondary | ICD-10-CM

## 2011-01-02 MED ORDER — CLONIDINE HCL 0.1 MG PO TABS: 0.1000 mg | ORAL_TABLET | Freq: Two times a day (BID) | ORAL | Status: DC

## 2011-01-02 NOTE — Progress Notes (Signed)
HPI:  This is a 65 year old woman presenting for followup evaluation. The patient has coronary artery disease with past history of drug eluting stent placement in the right coronary artery.  Since her last office visit, she required hospitalization at [facility] for treatment of severe epistaxis. She was actually hospitalized for about 6 days and had undergone some sort of arterial embolization procedure. She also has undergone right carotid endarterectomy in March 2012 without complications.  She denies chest pain or dyspnea. She complains of leg cramps and feel like a "charley horse" occurring at rest. She reports that blood pressure ranges have been from 140-180 over 80s to 90s. Occasionally she has had blood pressure readings as high as 200/100.  Outpatient Encounter Prescriptions as of 01/02/2011  Medication Sig Dispense Refill  . aspirin 81 MG tablet Take 81 mg by mouth daily.        . CRESTOR 40 MG tablet TAKE ONE TABLET BY MOUTH EVERY DAY  30 each  6  . diclofenac (VOLTAREN) 50 MG EC tablet Take 50 mg by mouth 3 (three) times daily as needed.        . ergocalciferol (VITAMIN D2) 50000 UNITS capsule Take 50,000 Units by mouth once a week. 2 times a week.       . furosemide (LASIX) 40 MG tablet Take 40 mg by mouth daily.        Marland Kitchen glucose blood test strip 1 each by Other route as needed. Use as instructed       . hydrALAZINE (APRESOLINE) 50 MG tablet Take 50 mg by mouth 3 (three) times daily.        . insulin glargine (LANTUS) 100 UNIT/ML injection Inject 23 Units into the skin 2 (two) times daily.        . irbesartan (AVAPRO) 300 MG tablet Take 300 mg by mouth daily.        . isosorbide mononitrate (IMDUR) 60 MG 24 hr tablet Take 60 mg by mouth daily.        Marland Kitchen KLOR-CON M20 20 MEQ tablet TAKE ONE TABLET BY MOUTH EVERY DAY  30 each  5  . metoprolol (TOPROL-XL) 50 MG 24 hr tablet Take by mouth daily. Take 1.5 tablets daily       . nitroGLYCERIN (NITROSTAT) 0.4 MG SL tablet Place 0.4 mg  under the tongue every 5 (five) minutes as needed.        . pantoprazole (PROTONIX) 40 MG tablet Take 40 mg by mouth daily.          No Known Allergies  Past Medical History  Diagnosis Date  . CAD (coronary artery disease)     sees. Dr. Excell Seltzer. s/p cath with PCI of RCA (xience des) June 2010. Pt also with LAD and D2 dzs-NL FFR in both areas med Rx  . HTN (hypertension)   . Hyperlipidemia   . TIA (transient ischemic attack)     also Hx of it.   Marland Kitchen GERD (gastroesophageal reflux disease)   . Type II or unspecified type diabetes mellitus without mention of complication, not stated as uncontrolled   . Acute sinusitis, unspecified   . Headache   . Anemia, unspecified   . Unspecified asthma   . Acute bronchitis   . Edema   . Routine general medical examination at a health care facility   . Depressive disorder, not elsewhere classified   . Family history of diabetes mellitus     1st degree relative  . Family history of  ischemic heart disease     Female, 1st degree <50  . Low back pain   . Asthma   . Obesity   . Gastric polyps   . Duodenal nodule   . Hemorrhoids   . Arthritis     ROS: Negative except as per HPI  BP 165/73  Pulse 59  Ht 5' 4.5" (1.638 m)  Wt 194 lb (87.998 kg)  BMI 32.79 kg/m2  PHYSICAL EXAM: Pt is alert and oriented, overweight woman in NAD HEENT: normal Neck: JVP - normal, carotids 2+= without bruits Lungs: CTA bilaterally CV: RRR without murmur or gallop Abd: soft, NT, Positive BS, no hepatomegaly Ext: no C/C/E, distal pulses intact and equal Skin: warm/dry no rash   EKG:  Sinus bradycardia 59 beats per minute, within normal limits.  ASSESSMENT AND PLAN:

## 2011-01-02 NOTE — Patient Instructions (Signed)
Your physician wants you to follow-up in: 6 months with Dr. Excell Seltzer.  You will receive a reminder letter in the mail two months in advance. If you don't receive a letter, please call our office to schedule the follow-up appointment.  Your physician has recommended you make the following change in your medication: Start Clonidine 0.1 mg by mouth twice daily.

## 2011-01-12 NOTE — Assessment & Plan Note (Signed)
The patient CAD is stable. She reports no angina. Main issue is going to be ongoing control of her hypertension.

## 2011-01-12 NOTE — Assessment & Plan Note (Signed)
The patient is on aggressive lipid lowering with Crestor 40 mg. Last LDL was 82.

## 2011-01-12 NOTE — Assessment & Plan Note (Signed)
The patient is on a combination of hydralazine, Avapro, isosorbide, and metoprolol. She was intolerant to Norvasc. She had severe leg swelling with this. We'll add clonidine 0.1 mg twice daily.

## 2011-01-15 ENCOUNTER — Encounter: Payer: Self-pay | Admitting: Family Medicine

## 2011-02-14 ENCOUNTER — Other Ambulatory Visit: Payer: Self-pay | Admitting: Cardiovascular Disease

## 2011-02-27 ENCOUNTER — Other Ambulatory Visit: Payer: Self-pay | Admitting: Cardiovascular Disease

## 2011-03-15 ENCOUNTER — Other Ambulatory Visit: Payer: Medicare Other

## 2011-03-22 ENCOUNTER — Other Ambulatory Visit: Payer: Medicare Other

## 2011-03-23 ENCOUNTER — Other Ambulatory Visit (INDEPENDENT_AMBULATORY_CARE_PROVIDER_SITE_OTHER): Payer: Medicare Other

## 2011-03-23 DIAGNOSIS — E119 Type 2 diabetes mellitus without complications: Secondary | ICD-10-CM

## 2011-03-23 DIAGNOSIS — Z0000 Encounter for general adult medical examination without abnormal findings: Secondary | ICD-10-CM

## 2011-03-23 LAB — BASIC METABOLIC PANEL
Calcium: 8.8 mg/dL (ref 8.4–10.5)
GFR: 81.25 mL/min (ref >60.00)
Potassium: 3.8 mEq/L (ref 3.5–5.1)
Sodium: 140 mEq/L (ref 135–145)

## 2011-03-23 LAB — CBC WITH DIFFERENTIAL/PLATELET
Basophils Absolute: 0 10*3/uL (ref 0.0–0.1)
Eosinophils Absolute: 0 10*3/uL (ref 0.0–0.7)
Eosinophils Relative: 0 % (ref 0.0–5.0)
HCT: 34.2 % — ABNORMAL LOW (ref 36.0–46.0)
Lymphs Abs: 1.9 10*3/uL (ref 0.7–4.0)
MCHC: 31.9 g/dL (ref 30.0–36.0)
MCV: 74.6 fl — ABNORMAL LOW (ref 78.0–100.0)
Monocytes Absolute: 0.5 10*3/uL (ref 0.1–1.0)
Neutrophils Relative %: 62.7 % (ref 43.0–77.0)
Platelets: 199 10*3/uL (ref 150.0–400.0)
RDW: 23.3 % — ABNORMAL HIGH (ref 11.5–14.6)
WBC: 6.6 10*3/uL (ref 4.5–10.5)

## 2011-03-23 LAB — HEPATIC FUNCTION PANEL
ALT: 20 U/L (ref 0–35)
Alkaline Phosphatase: 71 U/L (ref 39–117)
Bilirubin, Direct: 0.1 mg/dL (ref 0.0–0.3)
Total Protein: 7 g/dL (ref 6.0–8.3)

## 2011-03-23 LAB — POCT URINALYSIS DIPSTICK
Blood, UA: NEGATIVE
Protein, UA: NEGATIVE
Spec Grav, UA: 1.015
Urobilinogen, UA: 1
pH, UA: 6.5

## 2011-03-23 LAB — LIPID PANEL: Total CHOL/HDL Ratio: 2

## 2011-03-23 LAB — MICROALBUMIN / CREATININE URINE RATIO
Creatinine,U: 59.3 mg/dL
Microalb Creat Ratio: 1.2 mg/g (ref 0.0–30.0)

## 2011-03-23 LAB — TSH: TSH: 1.5 u[IU]/mL (ref 0.35–5.50)

## 2011-03-28 ENCOUNTER — Telehealth: Payer: Self-pay

## 2011-03-28 NOTE — Progress Notes (Signed)
Quick Note:  Pt aware ______ 

## 2011-03-28 NOTE — Telephone Encounter (Signed)
Called to give lab results and she states she went to see Dr. Laurene Footman on [facility] the other day and her A1C was 7.2.  Pt request that Dr. Clent Ridges get the results from him as well.  Pt states that her diabetes was out of control about 2 years ago with a reading of 12 for A1C.  Pt would like to know why the results say her diabetes is out of control.

## 2011-03-29 ENCOUNTER — Encounter: Payer: Medicare Other | Admitting: Family Medicine

## 2011-03-29 NOTE — Telephone Encounter (Signed)
First of all, I had no idea she was seeing an Endocrinologist or I would not have gotten an A1c at all. Her A1c is a little high which is why I said her diabetes is "a little out of control". By this I meant our target is to get this below 7.0. Obviously she is doing much better than before

## 2011-03-29 NOTE — Telephone Encounter (Signed)
Left voice message.

## 2011-04-02 ENCOUNTER — Other Ambulatory Visit (INDEPENDENT_AMBULATORY_CARE_PROVIDER_SITE_OTHER): Payer: Medicare Other | Admitting: *Deleted

## 2011-04-02 DIAGNOSIS — Z48812 Encounter for surgical aftercare following surgery on the circulatory system: Secondary | ICD-10-CM

## 2011-04-02 DIAGNOSIS — I6529 Occlusion and stenosis of unspecified carotid artery: Secondary | ICD-10-CM

## 2011-04-05 ENCOUNTER — Encounter: Payer: Self-pay | Admitting: Family Medicine

## 2011-04-05 ENCOUNTER — Ambulatory Visit (INDEPENDENT_AMBULATORY_CARE_PROVIDER_SITE_OTHER): Payer: Medicare Other | Admitting: Family Medicine

## 2011-04-05 VITALS — BP 140/80 | HR 64 | Temp 98.6°F | Ht 65.0 in | Wt 198.0 lb

## 2011-04-05 DIAGNOSIS — Z0000 Encounter for general adult medical examination without abnormal findings: Secondary | ICD-10-CM

## 2011-04-05 DIAGNOSIS — K635 Polyp of colon: Secondary | ICD-10-CM

## 2011-04-05 DIAGNOSIS — D126 Benign neoplasm of colon, unspecified: Secondary | ICD-10-CM

## 2011-04-05 LAB — CBC
Hemoglobin: 15.2 — ABNORMAL HIGH
MCHC: 34.4
MCV: 84.4
RBC: 5.23 — ABNORMAL HIGH
RDW: 14.7

## 2011-04-05 LAB — COMPREHENSIVE METABOLIC PANEL
ALT: 34
Alkaline Phosphatase: 92
BUN: 11
CO2: 24
GFR calc non Af Amer: >60
Glucose, Bld: 374 — ABNORMAL HIGH
Potassium: 3.8
Sodium: 137

## 2011-04-05 LAB — CK TOTAL AND CKMB (NOT AT ARMC): Relative Index: INVALID

## 2011-04-05 LAB — PROTIME-INR
INR: 0.9
Prothrombin Time: 12.4

## 2011-04-05 LAB — DIFFERENTIAL
Basophils Absolute: 0
Basophils Relative: 0
Eosinophils Absolute: 0.1
Neutro Abs: 6.2
Neutrophils Relative %: 62

## 2011-04-05 LAB — LIPID PANEL
HDL: 36 — ABNORMAL LOW
Total CHOL/HDL Ratio: 3.4
Triglycerides: 151 — ABNORMAL HIGH
VLDL: 30

## 2011-04-05 LAB — HEMOGLOBIN A1C: Hgb A1c MFr Bld: 12.8 — ABNORMAL HIGH

## 2011-04-05 LAB — APTT: aPTT: 26

## 2011-04-05 LAB — TSH: TSH: 2.745

## 2011-04-05 MED ORDER — FERROUS SULFATE 325 (65 FE) MG PO TABS: 325.0000 mg | ORAL_TABLET | Freq: Two times a day (BID) | ORAL | Status: DC

## 2011-04-05 NOTE — Progress Notes (Signed)
  Subjective:    Patient ID: [name], female    DOB: 09-04-1945, 65 y.o.   MRN: [id number]  HPI 65 yr old female for a cpx. She is doing well in general. She sees numerous specialists, and she has seen Cardiology a fair amount the past few months. Her HTN is now under reasonable control since Dr. Excell Seltzer added Clonidine to her regimen. Her last A1c was 7.5, so her diabetes is under the best control it has been for years. She is past due for a colonoscopy. She has not had a mammogram yet this year.    Review of Systems  Constitutional: Negative.   HENT: Negative.   Eyes: Negative.   Respiratory: Negative.   Cardiovascular: Negative.   Gastrointestinal: Negative.   Genitourinary: Negative for dysuria, urgency, frequency, hematuria, flank pain, decreased urine volume, enuresis, difficulty urinating, pelvic pain and dyspareunia.  Musculoskeletal: Negative.   Skin: Negative.   Neurological: Negative.   Hematological: Negative.   Psychiatric/Behavioral: Negative.        Objective:   Physical Exam  Constitutional: She is oriented to person, place, and time. She appears well-developed and well-nourished. No distress.  HENT:  Head: Normocephalic and atraumatic.  Right Ear: External ear normal.  Left Ear: External ear normal.  Nose: Nose normal.  Mouth/Throat: Oropharynx is clear and moist. No oropharyngeal exudate.  Eyes: Conjunctivae and EOM are normal. Pupils are equal, round, and reactive to light. No scleral icterus.  Neck: Normal range of motion. Neck supple. No JVD present. No thyromegaly present.  Cardiovascular: Normal rate, regular rhythm, normal heart sounds and intact distal pulses.  Exam reveals no gallop and no friction rub.   No murmur heard.      EKG is unremarkable  Pulmonary/Chest: Effort normal and breath sounds normal. No respiratory distress. She has no wheezes. She has no rales. She exhibits no tenderness.  Abdominal: Soft. Bowel sounds are normal. She  exhibits no distension and no mass. There is no tenderness. There is no rebound and no guarding.  Musculoskeletal: Normal range of motion. She exhibits no edema and no tenderness.  Lymphadenopathy:    She has no cervical adenopathy.  Neurological: She is alert and oriented to person, place, and time. She has normal reflexes. No cranial nerve deficit. She exhibits normal muscle tone. Coordination normal.  Skin: Skin is warm and dry. No rash noted. No erythema.  Psychiatric: She has a normal mood and affect. Her behavior is normal. Judgment and thought content normal.          Assessment & Plan:  She needs to exercise and lose some weight. See Dr. Evlyn Kanner for the diabetes. We will add iron supplements for her anemia. Set up another colonoscopy.

## 2011-04-06 NOTE — Procedures (Unsigned)
CAROTID DUPLEX EXAM  INDICATION:  Follow up right CEA.  HISTORY: Diabetes:  Yes. Cardiac:  Yes. Hypertension:  Yes. Smoking:  Previous. Previous Surgery:  Right CEA, 09/2010. CV History:  TIA, 2009. Amaurosis Fugax No, Paresthesias No, Hemiparesis No.                                      RIGHT             LEFT Brachial systolic pressure:         200               178 Brachial Doppler waveforms:         WNL               WNL Vertebral direction of flow:        Antegrade         Abnormal antegrade DUPLEX VELOCITIES (cm/sec) CCA peak systolic                   128               106 ECA peak systolic                   175               110 ICA peak systolic                   81                103 ICA end diastolic                   20                25 PLAQUE MORPHOLOGY:                                    Heterogenous PLAQUE AMOUNT:                      N/A               Mild PLAQUE LOCATION:                                      ICA  IMPRESSION: 1. Widely patent right carotid endarterectomy without evidence of     restenosis or hyperplasia. 2. 1% to 39% left internal carotid artery stenosis, status post     contralateral carotid endarterectomy.  This is decreased since the     previous examination. 3. Right vertebral artery and subclavian artery are within normal     limits. 4. Left vertebral artery has abnormal antegrade with an apparently     normal left subclavian artery.  ___________________________________________ Janetta Hora Fields, MD  LT/MEDQ  D:  04/02/2011  T:  04/02/2011  Job:  [id number]

## 2011-04-12 ENCOUNTER — Encounter: Payer: Self-pay | Admitting: Internal Medicine

## 2011-04-19 ENCOUNTER — Telehealth: Payer: Self-pay | Admitting: Cardiovascular Disease

## 2011-04-19 MED ORDER — METOPROLOL SUCCINATE ER 50 MG PO TB24: ORAL_TABLET | ORAL | Status: DC

## 2011-04-19 NOTE — Telephone Encounter (Signed)
Pt called and stated Dr. Excell Seltzer had increased her medication.  Since the increase she uses more and would like a new refill to cover her for a month(Metroprolol).  Nico[facility] on [address].  Also, the xtra pill he gave her (Clonidine) does not help her BP.  Still running around 160/79, 199/101, 209/97, 233/89 Please call her and advise.

## 2011-04-19 NOTE — Telephone Encounter (Signed)
I talked with pt. Pt states she has only been able to take clonidine 0.1mg  at bedtime instead of bid as Dr Excell Seltzer ordered in June. Pt states clonidine  causes her to have a really dry throat and mouth and  she can tolerate it if she only takes it at bedtime. She is concerned because her BP is still elevated. Pt states she is not willing to try clonidine 0.1mg  bid again. I will forward to Dr Excell Seltzer for review and recommendations.

## 2011-04-20 ENCOUNTER — Encounter: Payer: Self-pay | Admitting: Family Medicine

## 2011-04-21 NOTE — Telephone Encounter (Signed)
Please schedule her to come in for a visit with either Lorin Picket or Lawson Fiscal. She has severe HTN and I think has been intolerant to various meds. She needs med review - metoprolol and coreg both on list and she shouldn't be on 2 different beta blockers.

## 2011-04-23 NOTE — Telephone Encounter (Signed)
I spoke with the pt and made her aware that Dr Excell Seltzer would like her seen by the PA.  I offered the pt an appointment on 04/25/11 with Lorin Picket PA-C but the pt refuses to see a PA and only wants to see Dr Excell Seltzer.  I scheduled the pt to see Dr Excell Seltzer on 05/10/11.  I asked the pt if she is taking both Metoprolol and Carvedilol, the pt is only taking Metoprolol.

## 2011-05-09 ENCOUNTER — Other Ambulatory Visit: Payer: Medicare Other | Admitting: Internal Medicine

## 2011-05-10 ENCOUNTER — Encounter: Payer: Self-pay | Admitting: Cardiovascular Disease

## 2011-05-10 ENCOUNTER — Ambulatory Visit (INDEPENDENT_AMBULATORY_CARE_PROVIDER_SITE_OTHER): Payer: Medicare Other | Admitting: Cardiovascular Disease

## 2011-05-10 DIAGNOSIS — I2510 Atherosclerotic heart disease of native coronary artery without angina pectoris: Secondary | ICD-10-CM

## 2011-05-10 DIAGNOSIS — I10 Essential (primary) hypertension: Secondary | ICD-10-CM

## 2011-05-10 DIAGNOSIS — E785 Hyperlipidemia, unspecified: Secondary | ICD-10-CM

## 2011-05-10 MED ORDER — METOPROLOL SUCCINATE ER 100 MG PO TB24: 100.0000 mg | ORAL_TABLET | Freq: Every day | ORAL | Status: DC

## 2011-05-10 MED ORDER — HYDRALAZINE HCL 100 MG PO TABS: 100.0000 mg | ORAL_TABLET | Freq: Three times a day (TID) | ORAL | Status: DC

## 2011-05-10 NOTE — Progress Notes (Signed)
HPI:  [name] returns for followup evaluation. She is a 65 -year-old woman with coronary artery disease. She presented with unstable angina and was treated with a drug-eluting stent in the right coronary artery a few years ago. Pressure wire analysis of the LAD/diagonal was performed and this was negative. She also has carotid disease and underwent endarterectomy in 2012.  She's been doing okay from a symptomatic standpoint and specifically denies chest pain or pressure. She has mild dyspnea with exertion and this is unchanged. She has mild leg edema and this is also unchanged. She denies orthopnea, PND, or palpitations. She notes that her blood pressures continue to run high.  Outpatient Encounter Prescriptions as of 05/10/2011  Medication Sig Dispense Refill  . aspirin 81 MG tablet Take 81 mg by mouth daily.       . cloNIDine (CATAPRES) 0.1 MG tablet Take by mouth at bedtime.        . CRESTOR 40 MG tablet TAKE ONE TABLET BY MOUTH EVERY DAY  30 each  6  . diclofenac sodium (VOLTAREN) 1 % GEL Apply 1 application topically as needed.        . ergocalciferol (VITAMIN D2) 50000 UNITS capsule Take 50,000 Units by mouth once a week.       . furosemide (LASIX) 40 MG tablet Take 40 mg by mouth daily.        Marland Kitchen glucose blood test strip 1 each by Other route as needed. Use as instructed       . hydrALAZINE (APRESOLINE) 50 MG tablet Take 50 mg by mouth 3 (three) times daily.        . insulin aspart (NOVOLOG) 100 UNIT/ML injection Inject 5 Units into the skin 3 (three) times daily before meals.        . insulin glargine (LANTUS) 100 UNIT/ML injection Inject 30 Units into the skin 2 (two) times daily.       . irbesartan (AVAPRO) 300 MG tablet Take 300 mg by mouth daily.        . isosorbide mononitrate (IMDUR) 60 MG 24 hr tablet TAKE ONE TABLET BY MOUTH EVERY DAY  30 tablet  6  . KLOR-CON M20 20 MEQ tablet TAKE ONE TABLET BY MOUTH EVERY DAY  30 each  5  . metoprolol (TOPROL-XL) 50 MG 24 hr tablet Take 1 and 1/2  tablets daily  45 tablet  6  . nitroGLYCERIN (NITROSTAT) 0.4 MG SL tablet Place 0.4 mg under the tongue every 5 (five) minutes as needed.        . pantoprazole (PROTONIX) 40 MG tablet Take 40 mg by mouth daily.        Marland Kitchen DISCONTD: cloNIDine (CATAPRES) 0.1 MG tablet Take 1 tablet (0.1 mg total) by mouth 2 (two) times daily.  60 tablet  11  . DISCONTD: diclofenac (VOLTAREN) 50 MG EC tablet Take 50 mg by mouth 3 (three) times daily as needed.        Marland Kitchen DISCONTD: ferrous sulfate 325 (65 FE) MG tablet Take 1 tablet (325 mg total) by mouth 2 (two) times daily.  60 tablet  11    No Known Allergies  Past Medical History  Diagnosis Date  . CAD (coronary artery disease)     sees. Dr. Excell Seltzer. s/p cath with PCI of RCA (xience des) June 2010. Pt also with LAD and D2 dzs-NL FFR in both areas med Rx  . HTN (hypertension)   . Hyperlipidemia   . TIA (transient ischemic attack)  also Hx of it.   Marland Kitchen GERD (gastroesophageal reflux disease)   . Headache   . Anemia, unspecified   . Unspecified asthma   . Edema   . Depressive disorder, not elsewhere classified   . Low back pain   . Asthma   . Obesity   . Gastric polyps   . Duodenal nodule   . Hemorrhoids   . Arthritis   . Glaucoma     narrow angle, sees Dr. Elmer Picker   . Type II or unspecified type diabetes mellitus without mention of complication, not stated as uncontrolled     sees Dr. Ardyth Harps     ROS: Negative except as per HPI  BP 144/72  Pulse 60  Resp 18  Ht 5\' 4"  (1.626 m)  Wt 198 lb 12.8 oz (90.175 kg)  BMI 34.12 kg/m2  PHYSICAL EXAM: Pt is alert and oriented, very pleasant overweight woman in NAD HEENT: normal Neck: JVP - normal, carotids 2+= without bruits Lungs: CTA bilaterally CV: RRR without murmur or gallop Abd: soft, NT, Positive BS, no hepatomegaly Ext: no C/C/E, distal pulses intact and equal Skin: warm/dry no rash  EKG: normal sinus rhythm with nonspecific ST abnormality 63 beats per minute.  ASSESSMENT AND  PLAN:

## 2011-05-10 NOTE — Patient Instructions (Signed)
Your physician has recommended you make the following change in your medication:   Increase Toprol   Increase Hydralazine  Your physician wants you to follow-up in: 6 months with Dr. Excell Seltzer. You will receive a reminder letter in the mail two months in advance. If you don't receive a letter, please call our office to schedule the follow-up appointment.

## 2011-05-14 ENCOUNTER — Ambulatory Visit (AMBULATORY_SURGERY_CENTER): Payer: Medicare Other | Admitting: *Deleted

## 2011-05-14 ENCOUNTER — Encounter: Payer: Self-pay | Admitting: Internal Medicine

## 2011-05-14 DIAGNOSIS — Z1211 Encounter for screening for malignant neoplasm of colon: Secondary | ICD-10-CM

## 2011-05-14 DIAGNOSIS — Z86010 Personal history of colonic polyps: Secondary | ICD-10-CM

## 2011-05-14 MED ORDER — PEG-KCL-NACL-NASULF-NA ASC-C 100 G PO SOLR: 1.0000 | Freq: Once | ORAL | Status: DC

## 2011-05-29 ENCOUNTER — Encounter: Payer: Self-pay | Admitting: Internal Medicine

## 2011-05-29 ENCOUNTER — Ambulatory Visit (AMBULATORY_SURGERY_CENTER): Payer: Medicare Other | Admitting: Internal Medicine

## 2011-05-29 VITALS — BP 197/85 | HR 75 | Temp 97.2°F | Resp 20 | Ht 64.5 in | Wt 196.0 lb

## 2011-05-29 DIAGNOSIS — D126 Benign neoplasm of colon, unspecified: Secondary | ICD-10-CM

## 2011-05-29 DIAGNOSIS — Z86010 Personal history of colonic polyps: Secondary | ICD-10-CM

## 2011-05-29 DIAGNOSIS — Z1211 Encounter for screening for malignant neoplasm of colon: Secondary | ICD-10-CM

## 2011-05-29 HISTORY — PX: COLONOSCOPY: SHX174

## 2011-05-29 LAB — GLUCOSE, CAPILLARY: Glucose-Capillary: 152 mg/dL — ABNORMAL HIGH (ref 70–99)

## 2011-05-29 MED ORDER — SODIUM CHLORIDE 0.9 % IV SOLN: 500.0000 mL | INTRAVENOUS | Status: DC

## 2011-05-29 NOTE — Progress Notes (Signed)
Patient did not experience any of the following events: a burn prior to discharge; a fall within the facility; wrong site/side/patient/procedure/implant event; or a hospital transfer or hospital admission upon discharge from the facility. (G8907) Patient did not have preoperative order for IV antibiotic SSI prophylaxis. (G8918)  

## 2011-05-29 NOTE — Patient Instructions (Signed)
FOLLOW THE DISCHARGE INSTRUCTIONS ON THE GREEN AND BLUE INSTRUCTION SHEETS.  AWAIT PATHOLOGY RESULTS.  FOLLOW UP COLONOSCOPY IN 5 YEARS.

## 2011-05-30 ENCOUNTER — Telehealth: Payer: Self-pay | Admitting: *Deleted

## 2011-05-30 NOTE — Assessment & Plan Note (Signed)
[name] has fairly malignant hypertension on multidrug therapy. I have recommended that she increase her hydralazine to 100 mg 3 times daily and increase metoprolol succinate to 100 mg daily.

## 2011-05-30 NOTE — Telephone Encounter (Signed)

## 2011-05-30 NOTE — Assessment & Plan Note (Signed)
Cholesterol panel was reviewed. Her lipids are at goal with an LDL of 39, HDL 43 and total cholesterol 97. She is on high dose Crestor.

## 2011-05-30 NOTE — Assessment & Plan Note (Signed)
The patient is stable without angina. She will be continued on her current medical program with the exception of changes made to her antihypertensive drugs(see next problem).

## 2011-06-18 ENCOUNTER — Ambulatory Visit: Payer: Medicare Other | Admitting: Cardiovascular Disease

## 2011-06-25 ENCOUNTER — Other Ambulatory Visit: Payer: Self-pay | Admitting: Cardiovascular Disease

## 2011-06-28 ENCOUNTER — Ambulatory Visit (INDEPENDENT_AMBULATORY_CARE_PROVIDER_SITE_OTHER): Payer: Medicare Other | Admitting: Gynecology

## 2011-06-28 ENCOUNTER — Encounter: Payer: Self-pay | Admitting: Gynecology

## 2011-06-28 VITALS — BP 156/80 | Ht 65.0 in | Wt 201.0 lb

## 2011-06-28 DIAGNOSIS — Z780 Asymptomatic menopausal state: Secondary | ICD-10-CM

## 2011-06-28 DIAGNOSIS — B373 Candidiasis of vulva and vagina: Secondary | ICD-10-CM

## 2011-06-28 DIAGNOSIS — N907 Vulvar cyst: Secondary | ICD-10-CM

## 2011-06-28 DIAGNOSIS — N898 Other specified noninflammatory disorders of vagina: Secondary | ICD-10-CM

## 2011-06-28 DIAGNOSIS — N9089 Other specified noninflammatory disorders of vulva and perineum: Secondary | ICD-10-CM

## 2011-06-28 DIAGNOSIS — N952 Postmenopausal atrophic vaginitis: Secondary | ICD-10-CM

## 2011-06-28 MED ORDER — FLUCONAZOLE 150 MG PO TABS: 150.0000 mg | ORAL_TABLET | Freq: Once | ORAL | Status: AC

## 2011-06-28 NOTE — Progress Notes (Signed)
[name] 1945/08/24 [id number]        65 y.o.  for follow up exam.  Former patient of Dr. Leota Sauers without issues.  Past medical history,surgical history, medications, allergies, family history and social history were all reviewed and documented in the EPIC chart. ROS:  Was performed and pertinent positives and negatives are included in the history.  Exam: chaperone present Filed Vitals:   06/28/11 1226  BP: 156/80   General appearance  Normal Skin grossly normal Head/Neck normal with no cervical or supraclavicular adenopathy thyroid normal Lungs  clear Cardiac RR, without RMG Abdominal  soft, nontender, without masses, organomegaly or hernia Breasts  examined lying and sitting without masses, retractions, discharge or axillary adenopathy. Pelvic  Ext/BUS/vagina  Multiple small sebaceous cysts both labia majora classic in appearance noninflammatory. White discharge. Mild atrophic changes   Adnexa  Without masses or tenderness    Anus and perineum  normal   Rectovaginal  normal sphincter tone without palpated masses or tenderness.    Assessment/Plan:  65 y.o. female for annual exam.    1. Multiple small sebaceous cysts. Patient has multiple classic appearing sebaceous cysts on both labia majora. They've been there for years historically and do not bother her. She'll continue to observe them and as long as they remain stable and noninflammatory then she'll monitor. 2. White discharge. KOH wet prep is positive for yeast. We'll treat with Diflucan 150x1 dose. I did ask her to hold her cholesterol medicine the day she takes her Diflucan. 3. Hysterectomy. Patient had a TAH USO for leiomyoma and bleeding. She's not sure which ovary was left, in fact she's not exactly sure if any ovaries were left. Her exam is normal today she's not having any issues from a menopausal standpoint. 4. History of HSV 2. Patient has positive culture from 2007 when she had a vulvar lesion. She's had no  recurrences. She'll report if she does start to have any. 5. Pap smear. She has several normal Pap smear report in her chart, the last in 2011. I discussed current screening guidelines and I did not do a Pap smear today. She is status post hysterectomy and has no history of abnormal Paps historically. Discussed less frequent screening interval or stop it altogether and we'll readdress on an annual basis. 6. Breast health. SBE monthly reviewed. She had her mammogram October 2012 will continue with annual mammogram. 7. Colonoscopy. Patient had her colonoscopy in November 2012. She will follow up with their recommended interval. 8. Bone health. She's never had a bone density. Will go ahead and get a baseline DEXA and she will schedule this.  Increased calcium vitamin D reviewed. 9. Health maintenance. She's been followed for a number of medical issues and actively sees Dr. Clent Ridges. No blood work was done today this was all done through his office.    Dara Lords MD, 12:48 PM 06/28/2011

## 2011-06-28 NOTE — Patient Instructions (Signed)
Follow up for bone density study as scheduled

## 2011-06-28 NOTE — Progress Notes (Signed)
Addended byCammie Mcgee T on: 06/28/2011 05:18 PM   Modules accepted: Orders

## 2011-07-24 ENCOUNTER — Ambulatory Visit (INDEPENDENT_AMBULATORY_CARE_PROVIDER_SITE_OTHER): Payer: Medicare Other

## 2011-07-24 DIAGNOSIS — Z13820 Encounter for screening for osteoporosis: Secondary | ICD-10-CM

## 2011-07-24 DIAGNOSIS — Z780 Asymptomatic menopausal state: Secondary | ICD-10-CM

## 2011-07-26 ENCOUNTER — Other Ambulatory Visit: Payer: Self-pay | Admitting: Neurosurgery

## 2011-07-26 ENCOUNTER — Ambulatory Visit: Payer: Self-pay

## 2011-07-26 DIAGNOSIS — Z0289 Encounter for other administrative examinations: Secondary | ICD-10-CM

## 2011-07-26 DIAGNOSIS — Z111 Encounter for screening for respiratory tuberculosis: Secondary | ICD-10-CM

## 2011-07-26 DIAGNOSIS — M549 Dorsalgia, unspecified: Secondary | ICD-10-CM

## 2011-07-28 ENCOUNTER — Encounter (INDEPENDENT_AMBULATORY_CARE_PROVIDER_SITE_OTHER): Payer: Self-pay

## 2011-07-28 DIAGNOSIS — Z0389 Encounter for observation for other suspected diseases and conditions ruled out: Secondary | ICD-10-CM

## 2011-08-02 ENCOUNTER — Ambulatory Visit
Admission: RE | Admit: 2011-08-02 | Discharge: 2011-08-02 | Disposition: A | Discharge status: Home / Self Care | Payer: Medicare Other | Source: Ambulatory Visit | Attending: Neurosurgery | Admitting: Neurosurgery

## 2011-08-02 DIAGNOSIS — M549 Dorsalgia, unspecified: Secondary | ICD-10-CM

## 2011-08-02 DIAGNOSIS — M545 Low back pain: Secondary | ICD-10-CM

## 2011-08-02 MED ORDER — IOHEXOL 180 MG/ML  SOLN
15.0000 mL | Freq: Once | INTRAMUSCULAR | Status: AC | PRN
  Administered 2011-08-02: 15 mL via INTRATHECAL

## 2011-08-02 MED ORDER — ONDANSETRON HCL 4 MG/2ML IJ SOLN
4.0000 mg | Freq: Once | INTRAMUSCULAR | Status: AC
  Administered 2011-08-02: 4 mg via INTRAMUSCULAR

## 2011-08-02 MED ORDER — ONDANSETRON HCL 4 MG/2ML IJ SOLN: 4.0000 mg | Freq: Four times a day (QID) | INTRAMUSCULAR | Status: DC | PRN

## 2011-08-02 MED ORDER — DIAZEPAM 5 MG PO TABS
10.0000 mg | ORAL_TABLET | Freq: Once | ORAL | Status: AC
  Administered 2011-08-02: 10 mg via ORAL

## 2011-08-02 MED ORDER — MEPERIDINE HCL 100 MG/ML IJ SOLN
75.0000 mg | Freq: Once | INTRAMUSCULAR | Status: AC
  Administered 2011-08-02: 75 mg via INTRAMUSCULAR

## 2011-08-02 NOTE — Progress Notes (Signed)
Discharge instructions explained, consent signed and valium given. 

## 2011-08-20 ENCOUNTER — Other Ambulatory Visit: Payer: Self-pay | Admitting: Cardiovascular Disease

## 2011-08-20 NOTE — Telephone Encounter (Signed)
Refilled generic avapro

## 2011-09-26 ENCOUNTER — Other Ambulatory Visit: Payer: Self-pay | Admitting: Nurse Practitioner

## 2011-10-09 ENCOUNTER — Encounter: Payer: Self-pay | Admitting: Cardiovascular Disease

## 2011-10-09 ENCOUNTER — Ambulatory Visit (INDEPENDENT_AMBULATORY_CARE_PROVIDER_SITE_OTHER): Payer: Medicare Other | Admitting: Cardiovascular Disease

## 2011-10-09 VITALS — BP 150/76 | HR 56 | Ht 64.0 in | Wt 202.4 lb

## 2011-10-09 DIAGNOSIS — I2510 Atherosclerotic heart disease of native coronary artery without angina pectoris: Secondary | ICD-10-CM

## 2011-10-09 DIAGNOSIS — I119 Hypertensive heart disease without heart failure: Secondary | ICD-10-CM

## 2011-10-09 DIAGNOSIS — I10 Essential (primary) hypertension: Secondary | ICD-10-CM

## 2011-10-09 DIAGNOSIS — E785 Hyperlipidemia, unspecified: Secondary | ICD-10-CM

## 2011-10-09 NOTE — Progress Notes (Signed)
HPI:  66 year old woman presenting for followup evaluation. She has coronary artery disease and presented with unstable angina in 2010. She was found to have severe stenosis of the right coronary artery treated with a drug-eluting stent platform. She had moderate LAD/diagonal stenosis and this was interrogated with pressure wire analysis which was negative for ischemia. The patient is also followed for resistant hypertension and dyslipidemia. She has carotid stenosis and underwent carotid endarterectomy in 2012.   She was last seen in November 2012. Her blood pressure control was poor and I increased her hydralazine 100 mg 3 times daily and her metoprolol succinate to 100 mg daily at that time. She has had some improvement in her blood pressure readings but still has frequent systolic readings greater than 140. From a symptomatic perspective she has done well. She denies chest pain or pressure. Her edema has improved. She denies orthopnea, PND, or palpitations.  Outpatient Encounter Prescriptions as of 10/09/2011  Medication Sig Dispense Refill  . aspirin 81 MG tablet Take 81 mg by mouth daily.       . AVAPRO 300 MG tablet TAKE ONE TABLET BY MOUTH EVERY DAY  30 each  5  . CRESTOR 40 MG tablet TAKE ONE TABLET BY MOUTH EVERY DAY  30 each  6  . diclofenac sodium (VOLTAREN) 1 % GEL Apply 1 application topically as needed.        . ergocalciferol (VITAMIN D2) 50000 UNITS capsule Take 50,000 Units by mouth once a week.       . furosemide (LASIX) 40 MG tablet Take 40 mg by mouth daily.        Marland Kitchen glucose blood test strip 1 each by Other route as needed. Use as instructed       . hydrALAZINE (APRESOLINE) 100 MG tablet Take 1 tablet (100 mg total) by mouth 3 (three) times daily.  90 tablet  6  . insulin aspart (NOVOLOG) 100 UNIT/ML injection Inject 5 Units into the skin 3 (three) times daily before meals.        . insulin glargine (LANTUS) 100 UNIT/ML injection Inject 30 Units into the skin 2 (two) times  daily.       . isosorbide mononitrate (IMDUR) 60 MG 24 hr tablet TAKE ONE TABLET BY MOUTH EVERY DAY  30 tablet  6  . KLOR-CON M20 20 MEQ tablet TAKE ONE TABLET BY MOUTH EVERY DAY  30 each  5  . latanoprost (XALATAN) 0.005 % ophthalmic solution Place 1 drop into both eyes daily.       . meloxicam (MOBIC) 15 MG tablet Take 15 mg by mouth daily.       . metoprolol (TOPROL-XL) 100 MG 24 hr tablet Take 1 tablet (100 mg total) by mouth daily.  30 tablet  6  . nitroGLYCERIN (NITROSTAT) 0.4 MG SL tablet Place 0.4 mg under the tongue every 5 (five) minutes as needed.        . pantoprazole (PROTONIX) 40 MG tablet Take 40 mg by mouth daily.        Marland Kitchen DISCONTD: cloNIDine (CATAPRES) 0.1 MG tablet Take by mouth at bedtime.          No Known Allergies  Past Medical History  Diagnosis Date  . CAD (coronary artery disease)     sees. Dr. Excell Seltzer. s/p cath with PCI of RCA (xience des) June 2010. Pt also with LAD and D2 dzs-NL FFR in both areas med Rx  . HTN (hypertension)   . Hyperlipidemia   .  TIA (transient ischemic attack)     also Hx of it.   Marland Kitchen GERD (gastroesophageal reflux disease)   . Headache   . Anemia, unspecified   . Edema   . Depressive disorder, not elsewhere classified   . Low back pain   . Obesity   . Duodenal nodule   . Hemorrhoids   . Arthritis   . Glaucoma     narrow angle, sees Dr. Elmer Picker   . Type II or unspecified type diabetes mellitus without mention of complication, not stated as uncontrolled     sees Dr. Ardyth Harps   . Unspecified asthma     no per pt  . Gastric polyps     COLON    ROS: Negative except as per HPI  BP 150/76  Pulse 56  Ht 5\' 4"  (1.626 m)  Wt 91.808 kg (202 lb 6.4 oz)  BMI 34.74 kg/m2  PHYSICAL EXAM: Pt is alert and oriented, pleasant overweight woman in NAD HEENT: normal Neck: JVP - normal, carotids 2+= without bruits Lungs: CTA bilaterally CV: RRR without murmur or gallop Abd: soft, NT, Positive BS, no hepatomegaly Ext: no C/C/E, distal  pulses intact and equal Skin: warm/dry no rash  EKG:  Sinus bradycardia 56 beats per minute, otherwise within normal limits.  ASSESSMENT AND PLAN:

## 2011-10-09 NOTE — Assessment & Plan Note (Signed)
Lipids are at goal on Crestor 40 mg.

## 2011-10-09 NOTE — Patient Instructions (Signed)
Your physician has requested that you have a renal artery duplex. During this test, an ultrasound is used to evaluate blood flow to the kidneys. Allow one hour for this exam. Do not eat after midnight the day before and avoid carbonated beverages. Take your medications as you usually do.  Your physician wants you to follow-up in: 6 MONTHS with Dr Excell Seltzer.  You will receive a reminder letter in the mail two months in advance. If you don't receive a letter, please call our office to schedule the follow-up appointment.  Your physician recommends that you continue on your current medications as directed. Please refer to the Current Medication list given to you today.

## 2011-10-09 NOTE — Assessment & Plan Note (Signed)
Suboptimal blood pressure control on multidrug therapy. Will check a renal arterial duplex to rule out renal artery stenosis. However, I suspect this is simply resistant essential hypertension. I discussed consideration of renal denervation with the patient. She is interested in pursuing this. I am going to refer her to Dr. Samule Ohm in [place] to see if she is a candidate for the Symplicity-3 trial.

## 2011-10-09 NOTE — Assessment & Plan Note (Signed)
Stable without angina. Last Myoview scan in 2011 showed no ischemia. Continue current medical program.

## 2011-10-25 ENCOUNTER — Other Ambulatory Visit: Payer: Self-pay | Admitting: Cardiovascular Disease

## 2011-10-29 ENCOUNTER — Encounter (INDEPENDENT_AMBULATORY_CARE_PROVIDER_SITE_OTHER): Payer: Medicare Other

## 2011-10-29 DIAGNOSIS — I119 Hypertensive heart disease without heart failure: Secondary | ICD-10-CM

## 2011-10-29 DIAGNOSIS — I10 Essential (primary) hypertension: Secondary | ICD-10-CM

## 2011-10-31 ENCOUNTER — Telehealth: Payer: Self-pay | Admitting: Cardiovascular Disease

## 2011-10-31 NOTE — Telephone Encounter (Signed)
PT had an [place] on her abdomen on Monday and said dr cooper referring her to a dr in [place] and needs to know when and who she will see, knows lauren out today and will call her back tomorrow

## 2011-11-01 NOTE — Telephone Encounter (Signed)
I spoke with the pt and made her aware of renal duplex results.  I also made the pt aware that she was not a candidate for the research study being performed by the [facility] in [place].

## 2011-11-12 ENCOUNTER — Other Ambulatory Visit: Payer: Self-pay | Admitting: Cardiovascular Disease

## 2011-11-15 ENCOUNTER — Ambulatory Visit (INDEPENDENT_AMBULATORY_CARE_PROVIDER_SITE_OTHER): Payer: Medicare Other | Admitting: Family Medicine

## 2011-11-15 ENCOUNTER — Encounter: Payer: Self-pay | Admitting: Family Medicine

## 2011-11-15 VITALS — BP 144/88 | HR 59 | Temp 98.8°F | Wt 202.0 lb

## 2011-11-15 DIAGNOSIS — Z9109 Other allergy status, other than to drugs and biological substances: Secondary | ICD-10-CM

## 2011-11-15 DIAGNOSIS — H6120 Impacted cerumen, unspecified ear: Secondary | ICD-10-CM

## 2011-11-15 DIAGNOSIS — J309 Allergic rhinitis, unspecified: Secondary | ICD-10-CM

## 2011-11-15 NOTE — Progress Notes (Signed)
  Subjective:    Patient ID: [name], female    DOB: 1946/01/13, 66 y.o.   MRN: [id number]  HPI Here for 2 reasons. First she has had a lot of sneezing, itchy eyes, and runny nose for one month. No fever or cough or ST. Then 3 days ago she lost some hearing in the right ear and feels some pressure. No ear pain.    Review of Systems  Constitutional: Negative.   HENT: Positive for hearing loss, rhinorrhea, sneezing and postnasal drip. Negative for ear pain, congestion and tinnitus.   Eyes: Positive for itching. Negative for pain, discharge and redness.  Respiratory: Negative.        Objective:   Physical Exam  Constitutional: She appears well-developed and well-nourished.  HENT:  Left Ear: External ear normal.  Nose: Nose normal.  Mouth/Throat: Oropharynx is clear and moist. No oropharyngeal exudate.       Right ear canal is full of cerumen   Eyes: Conjunctivae are normal.  Neck: Neck supple.  Pulmonary/Chest: Effort normal and breath sounds normal.  Lymphadenopathy:    She has no cervical adenopathy.          Assessment & Plan:  The right ear canal was irrigated clear with water. She will try taking Claritin daily

## 2011-11-20 ENCOUNTER — Other Ambulatory Visit: Payer: Self-pay

## 2011-11-20 MED ORDER — FUROSEMIDE 40 MG PO TABS: 40.0000 mg | ORAL_TABLET | Freq: Every day | ORAL | Status: DC

## 2011-11-20 NOTE — Telephone Encounter (Signed)
..   Requested Prescriptions   Signed Prescriptions Disp Refills  . furosemide (LASIX) 40 MG tablet 30 tablet 6    Sig: Take 1 tablet (40 mg total) by mouth daily.    Authorizing Provider: Tonny Bollman    Ordering User: Christella Hartigan, Wyatt Thorstenson Judie Petit

## 2011-12-04 ENCOUNTER — Ambulatory Visit (INDEPENDENT_AMBULATORY_CARE_PROVIDER_SITE_OTHER): Payer: Medicare Other | Admitting: Family Medicine

## 2011-12-04 ENCOUNTER — Encounter: Payer: Self-pay | Admitting: Family Medicine

## 2011-12-04 VITALS — BP 158/80 | HR 60 | Temp 99.3°F | Wt 201.0 lb

## 2011-12-04 DIAGNOSIS — Z9109 Other allergy status, other than to drugs and biological substances: Secondary | ICD-10-CM

## 2011-12-04 DIAGNOSIS — J309 Allergic rhinitis, unspecified: Secondary | ICD-10-CM

## 2011-12-04 MED ORDER — LEVOCETIRIZINE DIHYDROCHLORIDE 5 MG PO TABS: 5.0000 mg | ORAL_TABLET | Freq: Every evening | ORAL | Status: DC

## 2011-12-04 MED ORDER — OLOPATADINE HCL 0.1 % OP SOLN: 1.0000 [drp] | Freq: Two times a day (BID) | OPHTHALMIC | Status: DC

## 2011-12-04 MED ORDER — FLUTICASONE PROPIONATE 50 MCG/ACT NA SUSP: 2.0000 | Freq: Every day | NASAL | Status: DC

## 2011-12-04 MED ORDER — METHYLPREDNISOLONE ACETATE 80 MG/ML IJ SUSP
120.0000 mg | Freq: Once | INTRAMUSCULAR | Status: AC
  Administered 2011-12-04: 120 mg via INTRAMUSCULAR

## 2011-12-04 NOTE — Progress Notes (Signed)
  Subjective:    Patient ID: [name], female    DOB: [birth year], 66 y.o.   MRN: [id number]  HPI Here for continuing allergy problems including red itchy eyes, sneezing, and nasal congestion. Zyrtec and Claritin have not helped. No fever or ST or cough.    Review of Systems  HENT: Positive for congestion, rhinorrhea, sneezing and postnasal drip.   Eyes: Positive for redness and itching. Negative for pain and discharge.  Respiratory: Negative.        Objective:   Physical Exam  Constitutional: She appears well-developed and well-nourished.  HENT:  Right Ear: External ear normal.  Left Ear: External ear normal.  Nose: Nose normal.  Mouth/Throat: Oropharynx is clear and moist.  Eyes: Pupils are equal, round, and reactive to light.       Left conjunctiva is red, right is clear   Neck: Neck supple.  Pulmonary/Chest: Effort normal and breath sounds normal.  Lymphadenopathy:    She has no cervical adenopathy.          Assessment & Plan:  Switch to Xyzal, also try Flonase sprays and Patanol drops. recheck prn

## 2011-12-04 NOTE — Progress Notes (Signed)
Addended by: Aniceto Boss A on: 12/04/2011 10:27 AM   Modules accepted: Orders

## 2012-01-18 ENCOUNTER — Other Ambulatory Visit: Payer: Self-pay | Admitting: *Deleted

## 2012-01-18 MED ORDER — PANTOPRAZOLE SODIUM 40 MG PO TBEC: 40.0000 mg | DELAYED_RELEASE_TABLET | Freq: Every day | ORAL | Status: DC

## 2012-01-22 ENCOUNTER — Encounter (HOSPITAL_COMMUNITY): Payer: Self-pay | Admitting: Emergency Medicine

## 2012-01-22 ENCOUNTER — Emergency Department (INDEPENDENT_AMBULATORY_CARE_PROVIDER_SITE_OTHER)
Admission: EM | Admit: 2012-01-22 | Discharge: 2012-01-22 | Disposition: A | Discharge status: Home / Self Care | Payer: Self-pay | Source: Home / Self Care

## 2012-01-22 ENCOUNTER — Emergency Department (INDEPENDENT_AMBULATORY_CARE_PROVIDER_SITE_OTHER): Payer: Medicare Other

## 2012-01-22 DIAGNOSIS — L02212 Cutaneous abscess of back [any part, except buttock]: Secondary | ICD-10-CM | POA: Diagnosis present

## 2012-01-22 DIAGNOSIS — M549 Dorsalgia, unspecified: Secondary | ICD-10-CM

## 2012-01-22 DIAGNOSIS — M542 Cervicalgia: Secondary | ICD-10-CM

## 2012-01-22 DIAGNOSIS — M5412 Radiculopathy, cervical region: Secondary | ICD-10-CM

## 2012-01-22 DIAGNOSIS — L03319 Cellulitis of trunk, unspecified: Secondary | ICD-10-CM

## 2012-01-22 MED ORDER — CYCLOBENZAPRINE HCL 10 MG PO TABS: 10.0000 mg | ORAL_TABLET | Freq: Two times a day (BID) | ORAL | Status: DC | PRN

## 2012-01-22 MED ORDER — KETOROLAC TROMETHAMINE 60 MG/2ML IM SOLN
60.0000 mg | Freq: Once | INTRAMUSCULAR | Status: AC
  Administered 2012-01-22: 60 mg via INTRAMUSCULAR

## 2012-01-22 MED ORDER — KETOROLAC TROMETHAMINE 60 MG/2ML IM SOLN
INTRAMUSCULAR | Status: AC
  Filled 2012-01-22: qty 2

## 2012-01-22 MED ORDER — TRAMADOL HCL 50 MG PO TABS: 50.0000 mg | ORAL_TABLET | Freq: Four times a day (QID) | ORAL | Status: AC | PRN

## 2012-01-22 MED ORDER — IBUPROFEN 600 MG PO TABS: 600.0000 mg | ORAL_TABLET | Freq: Four times a day (QID) | ORAL | Status: AC | PRN

## 2012-01-22 NOTE — ED Notes (Signed)
Results not crossing over, delay in discharge

## 2012-01-22 NOTE — ED Notes (Signed)
Patient reports "sugar issues" feeling better.  Complaints of headache, requesting tylenol.  Porfirio Mylar, np notified .  No orders .
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
## 2012-01-22 NOTE — ED Notes (Signed)
Patient just returned from radiology to treatment room

## 2012-01-22 NOTE — ED Provider Notes (Signed)
Medical screening examination/treatment/procedure(s) were performed by non-physician practitioner and as supervising physician I was immediately available for consultation/collaboration.  Leslee Home, M.D.   Reuben Likes, MD 01/22/12 2152
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
## 2012-01-22 NOTE — ED Notes (Signed)
Patient is very talkative .  Received injection and asked many questions, appropriate questions

## 2012-01-22 NOTE — ED Notes (Signed)
Patient asking for applesauce for "sugar" offered what we have available.  Graham crackers, peanut butter and sprite.

## 2012-01-25 ENCOUNTER — Encounter: Payer: Self-pay | Admitting: Vascular Surgery

## 2012-02-08 ENCOUNTER — Other Ambulatory Visit: Payer: Self-pay | Admitting: *Deleted

## 2012-02-08 DIAGNOSIS — Z48812 Encounter for surgical aftercare following surgery on the circulatory system: Secondary | ICD-10-CM

## 2012-02-08 DIAGNOSIS — I6529 Occlusion and stenosis of unspecified carotid artery: Secondary | ICD-10-CM

## 2012-02-13 ENCOUNTER — Encounter: Payer: Self-pay | Admitting: Neurosurgery

## 2012-02-14 ENCOUNTER — Ambulatory Visit: Payer: Self-pay | Admitting: Neurosurgery

## 2012-02-14 ENCOUNTER — Other Ambulatory Visit: Payer: Self-pay

## 2012-02-26 ENCOUNTER — Other Ambulatory Visit: Payer: Self-pay | Admitting: Cardiovascular Disease

## 2012-02-28 ENCOUNTER — Encounter: Payer: Self-pay | Admitting: Neurosurgery

## 2012-02-29 ENCOUNTER — Other Ambulatory Visit (INDEPENDENT_AMBULATORY_CARE_PROVIDER_SITE_OTHER): Payer: Medicare Other | Admitting: *Deleted

## 2012-02-29 ENCOUNTER — Encounter: Payer: Self-pay | Admitting: Neurosurgery

## 2012-02-29 ENCOUNTER — Ambulatory Visit (INDEPENDENT_AMBULATORY_CARE_PROVIDER_SITE_OTHER): Payer: Medicare Other | Admitting: Neurosurgery

## 2012-02-29 VITALS — BP 163/77 | HR 50 | Resp 18 | Ht 64.5 in | Wt 197.0 lb

## 2012-02-29 DIAGNOSIS — I6529 Occlusion and stenosis of unspecified carotid artery: Secondary | ICD-10-CM

## 2012-02-29 DIAGNOSIS — Z48812 Encounter for surgical aftercare following surgery on the circulatory system: Secondary | ICD-10-CM

## 2012-02-29 DIAGNOSIS — I779 Disorder of arteries and arterioles, unspecified: Secondary | ICD-10-CM | POA: Insufficient documentation

## 2012-02-29 NOTE — Progress Notes (Signed)
[facility] Carotid Office Note  CC: Annual carotid duplex for surveillance Referring Physician: Fields  History of Present Illness: 66 year old female patient of Dr. Darrick Penna status post right carotid CEA in March 2012. Patient denies any signs or symptoms of CVA, TIA, amaurosis fugax or any neural deficit. The patient denies any new medical diagnoses or recent surgery.  Past Medical History  Diagnosis Date  . CAD (coronary artery disease)     sees. Dr. Excell Seltzer. s/p cath with PCI of RCA (xience des) June 2010. Pt also with LAD and D2 dzs-NL FFR in both areas med Rx  . HTN (hypertension)   . Hyperlipidemia   . TIA (transient ischemic attack)     also Hx of it.   Marland Kitchen GERD (gastroesophageal reflux disease)   . Headache   . Anemia, unspecified   . Edema   . Depressive disorder, not elsewhere classified   . Low back pain   . Obesity   . Duodenal nodule   . Hemorrhoids   . Arthritis   . Glaucoma     narrow angle, sees Dr. Elmer Picker   . Type II or unspecified type diabetes mellitus without mention of complication, not stated as uncontrolled     sees Dr. Ardyth Harps   . Unspecified asthma     no per pt  . Gastric polyps     COLON  . Allergy     ROS: [x]  Positive   [ ]  Denies    General: [ ]  Weight loss, [ ]  Fever, [ ]  chills Neurologic: [ ]  Dizziness, [ ]  Blackouts, [ ]  Seizure [ ]  Stroke, [ ]  "Mini stroke", [ ]  Slurred speech, [ ]  Temporary blindness; [ ]  weakness in arms or legs, [ ]  Hoarseness Cardiac: [ ]  Chest pain/pressure, [ ]  Shortness of breath at rest [ ]  Shortness of breath with exertion, [ ]  Atrial fibrillation or irregular heartbeat Vascular: [ ]  Pain in legs with walking, [ ]  Pain in legs at rest, [ ]  Pain in legs at night,  [ ]  Non-healing ulcer, [ ]  Blood clot in vein/DVT,   Pulmonary: [ ]  Home oxygen, [ ]  Productive cough, [ ]  Coughing up blood, [ ]  Asthma,  [ ]  Wheezing Musculoskeletal:  [ ]  Arthritis, [ ]  Low back pain, [ ]  Joint  pain Hematologic: [ ]  Easy Bruising, [ ]  Anemia; [ ]  Hepatitis Gastrointestinal: [ ]  Blood in stool, [ ]  Gastroesophageal Reflux/heartburn, [ ]  Trouble swallowing Urinary: [ ]  chronic Kidney disease, [ ]  on HD - [ ]  MWF or [ ]  TTHS, [ ]  Burning with urination, [ ]  Difficulty urinating Skin: [ ]  Rashes, [ ]  Wounds Psychological: [ ]  Anxiety, [ ]  Depression   Social History History  Substance Use Topics  . Smoking status: Former Games developer  . Smokeless tobacco: Never Used   Comment: non smoker  . Alcohol Use: No    Family History Family History  Problem Relation Age of Onset  . Coronary artery disease Neg Hx     premature CAD  . Colon cancer Neg Hx   . Esophageal cancer Neg Hx   . Stomach cancer Neg Hx   . Hypertension Mother   . Hypertension Sister   . Hypertension Brother   . Cancer Brother     PROSTATE/LUNG  . Cancer Father     Prostate and pancreatic     No Known Allergies  Current Outpatient Prescriptions  Medication Sig Dispense Refill  . aspirin 81  MG tablet Take 81 mg by mouth daily.       . AVAPRO 300 MG tablet TAKE ONE TABLET BY MOUTH EVERY DAY  30 each  5  . CRESTOR 40 MG tablet TAKE ONE TABLET BY MOUTH EVERY DAY  30 each  6  . diclofenac sodium (VOLTAREN) 1 % GEL Apply 1 application topically as needed.        . ergocalciferol (VITAMIN D2) 50000 UNITS capsule Take 50,000 Units by mouth once a week.       . furosemide (LASIX) 40 MG tablet Take 1 tablet (40 mg total) by mouth daily.  30 tablet  6  . glucose blood test strip 1 each by Other route as needed. Use as instructed       . hydrALAZINE (APRESOLINE) 100 MG tablet Take 1 tablet (100 mg total) by mouth 3 (three) times daily.  90 tablet  6  . insulin aspart (NOVOLOG) 100 UNIT/ML injection Inject 5 Units into the skin 3 (three) times daily before meals.        . insulin glargine (LANTUS) 100 UNIT/ML injection Inject 30 Units into the skin 2 (two) times daily.       . isosorbide mononitrate (IMDUR) 60 MG 24 hr  tablet TAKE ONE TABLET BY MOUTH EVERY DAY  30 tablet  6  . KLOR-CON M20 20 MEQ tablet TAKE ONE TABLET BY MOUTH EVERY DAY  30 each  5  . latanoprost (XALATAN) 0.005 % ophthalmic solution Place 1 drop into both eyes daily.       Marland Kitchen levocetirizine (XYZAL) 5 MG tablet Take 1 tablet (5 mg total) by mouth every evening.  30 tablet  11  . meloxicam (MOBIC) 15 MG tablet Take 15 mg by mouth daily.       . metoprolol succinate (TOPROL-XL) 100 MG 24 hr tablet TAKE ONE TABLET (100MG  TOTAL) BY MOUTH EVERY DAY  30 tablet  9  . nitroGLYCERIN (NITROSTAT) 0.4 MG SL tablet Place 0.4 mg under the tongue every 5 (five) minutes as needed.        Marland Kitchen olopatadine (PATANOL) 0.1 % ophthalmic solution Place 1 drop into both eyes 2 (two) times daily.  5 mL  5  . pantoprazole (PROTONIX) 40 MG tablet Take 1 tablet (40 mg total) by mouth daily.  30 tablet  9  . fluticasone (FLONASE) 50 MCG/ACT nasal spray Place 2 sprays into the nose daily.  16 g  11    Physical Examination  Filed Vitals:   02/29/12 1050  BP: 163/77  Pulse: 50  Resp:     Body mass index is 33.29 kg/(m^2).  General:  WDWN in NAD Gait: Normal HEENT: WNL Eyes: Pupils equal Pulmonary: normal non-labored breathing , without Rales, rhonchi,  wheezing Cardiac: RRR, without  Murmurs, rubs or gallops; Abdomen: soft, NT, no masses Skin: no rashes, ulcers noted  Vascular Exam Pulses: 3+ radial pulses bilaterally Carotid bruits: Carotid pulses to auscultation no bruits are heard Extremities without ischemic changes, no Gangrene , no cellulitis; no open wounds;  Musculoskeletal: no muscle wasting or atrophy   Neurologic: A&O X 3; Appropriate Affect ; SENSATION: normal; MOTOR FUNCTION:  moving all extremities equally. Speech is fluent/normal  Non-Invasive Vascular Imaging CAROTID DUPLEX 02/29/2012  Right ICA 0 - 19% stenosis Left ICA 20 - 39 % stenosis   ASSESSMENT/PLAN: Asymptomatic patient with a patent right CEA site. The patient will followup in  one year with repeat carotid duplex. [name] questions were encouraged and  answered, she is in agreement with this plan.  Lauree Chandler ANP   Clinic MD: Imogene Burn

## 2012-03-21 ENCOUNTER — Other Ambulatory Visit (INDEPENDENT_AMBULATORY_CARE_PROVIDER_SITE_OTHER): Payer: Medicare Other

## 2012-03-21 DIAGNOSIS — Z79899 Other long term (current) drug therapy: Secondary | ICD-10-CM

## 2012-03-21 DIAGNOSIS — Z0000 Encounter for general adult medical examination without abnormal findings: Secondary | ICD-10-CM

## 2012-03-21 LAB — CBC WITH DIFFERENTIAL/PLATELET
Basophils Absolute: 0 10*3/uL (ref 0.0–0.1)
Basophils Relative: 0.3 % (ref 0.0–3.0)
Eosinophils Absolute: 0.1 10*3/uL (ref 0.0–0.7)
HCT: 38 % (ref 36.0–46.0)
Hemoglobin: 11.9 g/dL — ABNORMAL LOW (ref 12.0–15.0)
Lymphs Abs: 2.4 10*3/uL (ref 0.7–4.0)
MCHC: 31.4 g/dL (ref 30.0–36.0)
MCV: 84.3 fl (ref 78.0–100.0)
Neutro Abs: 5.9 10*3/uL (ref 1.4–7.7)
RDW: 17.6 % — ABNORMAL HIGH (ref 11.5–14.6)

## 2012-03-21 LAB — POCT URINALYSIS DIPSTICK
Bilirubin, UA: NEGATIVE
Blood, UA: NEGATIVE
Leukocytes, UA: NEGATIVE
Nitrite, UA: NEGATIVE
Urobilinogen, UA: 0.2
pH, UA: 6.5

## 2012-03-21 LAB — BASIC METABOLIC PANEL
CO2: 27 mEq/L (ref 19–32)
Chloride: 110 mEq/L (ref 96–112)
Glucose, Bld: 118 mg/dL — ABNORMAL HIGH (ref 70–99)
Sodium: 143 mEq/L (ref 135–145)

## 2012-03-21 LAB — HEPATIC FUNCTION PANEL
ALT: 17 U/L (ref 0–35)
AST: 18 U/L (ref 0–37)
Bilirubin, Direct: 0 mg/dL (ref 0.0–0.3)
Total Bilirubin: 0.5 mg/dL (ref 0.3–1.2)

## 2012-03-21 LAB — MICROALBUMIN / CREATININE URINE RATIO
Creatinine,U: 105.4 mg/dL
Microalb Creat Ratio: 1.6 mg/g (ref 0.0–30.0)

## 2012-03-21 LAB — LIPID PANEL: Total CHOL/HDL Ratio: 3

## 2012-03-21 LAB — HEMOGLOBIN A1C: Hgb A1c MFr Bld: 7.1 % — ABNORMAL HIGH (ref 4.6–6.5)

## 2012-03-25 NOTE — Progress Notes (Signed)
Quick Note:  Called and spoke with pt and pt is aware. ______ 

## 2012-03-26 ENCOUNTER — Encounter: Payer: Self-pay | Admitting: Family Medicine

## 2012-03-26 ENCOUNTER — Ambulatory Visit (INDEPENDENT_AMBULATORY_CARE_PROVIDER_SITE_OTHER): Payer: Medicare Other | Admitting: Family Medicine

## 2012-03-26 VITALS — BP 148/76 | HR 67 | Temp 98.4°F | Ht 65.25 in | Wt 204.0 lb

## 2012-03-26 DIAGNOSIS — Z0000 Encounter for general adult medical examination without abnormal findings: Secondary | ICD-10-CM

## 2012-03-26 DIAGNOSIS — Z23 Encounter for immunization: Secondary | ICD-10-CM

## 2012-03-26 NOTE — Addendum Note (Signed)
Addended by: Aniceto Boss A on: 03/26/2012 02:29 PM   Modules accepted: Orders
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
## 2012-04-22 ENCOUNTER — Encounter: Payer: Self-pay | Admitting: Cardiovascular Disease

## 2012-04-22 ENCOUNTER — Ambulatory Visit (INDEPENDENT_AMBULATORY_CARE_PROVIDER_SITE_OTHER): Payer: Medicare Other | Admitting: Cardiovascular Disease

## 2012-04-22 VITALS — BP 144/72 | HR 63 | Ht 65.0 in | Wt 206.0 lb

## 2012-04-22 DIAGNOSIS — I2510 Atherosclerotic heart disease of native coronary artery without angina pectoris: Secondary | ICD-10-CM

## 2012-04-22 NOTE — Progress Notes (Signed)
HPI:  66 year-old woman presenting for followup of coronary artery disease and resistant hypertension. She initially presented in 2010 with unstable angina and was noted to have severe stenosis of the right coronary artery treated with a drug-eluting stent. She had moderate stenosis of the LAD and diagonal branches, both interrogated with a pressure wire and these were negative for ischemia. She has been managed medically in the interim. She has had suboptimal control of her hypertension on multiple drugs at maximal dose. I referred her for consideration of renal artery denervation, but she did not meet study criteria. Her blood pressure has been a little better controlled of late, but she still has frequent systolic readings in the 160s. She denies chest pain or pressure. She's had mild ankle swelling. She denies dyspnea, orthopnea, PND, or palpitations. She's been compliant with her medical program. She been under a lot of stress lately related to issues with her son.  Outpatient Encounter Prescriptions as of 04/22/2012  Medication Sig Dispense Refill  . aspirin 81 MG tablet Take 81 mg by mouth daily.       . AVAPRO 300 MG tablet TAKE ONE TABLET BY MOUTH EVERY DAY  30 each  5  . CRESTOR 40 MG tablet TAKE ONE TABLET BY MOUTH EVERY DAY  30 each  6  . diclofenac sodium (VOLTAREN) 1 % GEL Apply 1 application topically as needed.        . ergocalciferol (VITAMIN D2) 50000 UNITS capsule Take 50,000 Units by mouth once a week.       . furosemide (LASIX) 40 MG tablet Take 1 tablet (40 mg total) by mouth daily.  30 tablet  6  . glucose blood test strip 1 each by Other route as needed. Use as instructed       . hydrALAZINE (APRESOLINE) 100 MG tablet Take 1 tablet (100 mg total) by mouth 3 (three) times daily.  90 tablet  6  . insulin aspart (NOVOLOG) 100 UNIT/ML injection Inject 5 Units into the skin 3 (three) times daily before meals.        . insulin glargine (LANTUS) 100 UNIT/ML injection Inject 30 Units  into the skin 2 (two) times daily.       . isosorbide mononitrate (IMDUR) 60 MG 24 hr tablet TAKE ONE TABLET BY MOUTH EVERY DAY  30 tablet  6  . KLOR-CON M20 20 MEQ tablet TAKE ONE TABLET BY MOUTH EVERY DAY  30 each  5  . latanoprost (XALATAN) 0.005 % ophthalmic solution Place 1 drop into both eyes daily.       . meloxicam (MOBIC) 15 MG tablet Take 15 mg by mouth daily.       . metoprolol succinate (TOPROL-XL) 100 MG 24 hr tablet TAKE ONE TABLET (100MG  TOTAL) BY MOUTH EVERY DAY  30 tablet  9  . nitroGLYCERIN (NITROSTAT) 0.4 MG SL tablet Place 0.4 mg under the tongue every 5 (five) minutes as needed.        Marland Kitchen olopatadine (PATANOL) 0.1 % ophthalmic solution Place 1 drop into both eyes 2 (two) times daily.  5 mL  5  . pantoprazole (PROTONIX) 40 MG tablet Take 1 tablet (40 mg total) by mouth daily.  30 tablet  9  . DISCONTD: fluticasone (FLONASE) 50 MCG/ACT nasal spray Place 2 sprays into the nose daily.  16 g  11  . DISCONTD: levocetirizine (XYZAL) 5 MG tablet Take 1 tablet (5 mg total) by mouth every evening.  30 tablet  11  No Known Allergies  Past Medical History  Diagnosis Date  . CAD (coronary artery disease)     sees. Dr. Excell Seltzer. s/p cath with PCI of RCA (xience des) June 2010. Pt also with LAD and D2 dzs-NL FFR in both areas med Rx  . HTN (hypertension)   . Hyperlipidemia   . TIA (transient ischemic attack)     also Hx of it.   Marland Kitchen GERD (gastroesophageal reflux disease)   . Headache   . Anemia, unspecified   . Edema   . Depressive disorder, not elsewhere classified   . Low back pain   . Obesity   . Duodenal nodule   . Hemorrhoids   . Arthritis   . Glaucoma     narrow angle, sees Dr. Elmer Picker   . Type II or unspecified type diabetes mellitus without mention of complication, not stated as uncontrolled     sees Dr. Ardyth Harps   . Unspecified asthma     no per pt  . Gastric polyps     COLON  . Allergy    ROS: Negative except as per HPI  BP 144/72  Pulse 63  Ht 5\' 5"   (1.651 m)  Wt 93.441 kg (206 lb)  BMI 34.28 kg/m2  SpO2 98%  PHYSICAL EXAM: Pt is alert and oriented, pleasant woman in NAD HEENT: normal Neck: JVP - normal, carotids 2+= without bruits Lungs: CTA bilaterally CV: RRR without murmur or gallop Abd: soft, NT, Positive BS, no hepatomegaly Ext: no C/C/E, distal pulses intact and equal Skin: warm/dry no rash  EKG: Sinus rhythm 59 beats per minute, within normal limits.   ASSESSMENT AND PLAN: 1. Native vessel coronary artery disease. The patient is stable without anginal symptoms. We'll continue her current medical program which includes aspirin for antiplatelet therapy, high-dose Crestor for lipid lowering, and an ARB in the setting of her diabetes.  2. Hypertension. She will continue her same medical program. We will keep her in mind as indications expand for renal denervation.  3. Hyperlipidemia. Recent lipids reviewed with a cholesterol of 117, LDL 52, and HDL 45. She will continue on Crestor.  For followup I will see her back in 6 months.  Tonny Bollman 04/22/2012 1:25 PM

## 2012-04-22 NOTE — Patient Instructions (Addendum)
Your physician wants you to follow-up in: 6 months You will receive a reminder letter in the mail two months in advance. If you don't receive a letter, please call our office to schedule the follow-up appointment.     Continue same medication

## 2012-04-28 ENCOUNTER — Encounter: Payer: Self-pay | Admitting: Family Medicine

## 2012-05-10 ENCOUNTER — Other Ambulatory Visit: Payer: Self-pay | Admitting: Cardiovascular Disease

## 2012-05-29 ENCOUNTER — Other Ambulatory Visit: Payer: Self-pay | Admitting: Cardiovascular Disease

## 2012-06-10 ENCOUNTER — Ambulatory Visit (INDEPENDENT_AMBULATORY_CARE_PROVIDER_SITE_OTHER): Payer: Medicare Other | Admitting: Family Medicine

## 2012-06-10 ENCOUNTER — Encounter: Payer: Self-pay | Admitting: Family Medicine

## 2012-06-10 VITALS — BP 146/84 | HR 120 | Temp 98.1°F | Wt 207.0 lb

## 2012-06-10 DIAGNOSIS — J329 Chronic sinusitis, unspecified: Secondary | ICD-10-CM

## 2012-06-10 MED ORDER — METHYLPREDNISOLONE ACETATE 80 MG/ML IJ SUSP
120.0000 mg | Freq: Once | INTRAMUSCULAR | Status: AC
  Administered 2012-06-10: 120 mg via INTRAMUSCULAR

## 2012-06-10 MED ORDER — AZITHROMYCIN 250 MG PO TABS: ORAL_TABLET | ORAL | Status: DC

## 2012-06-10 NOTE — Addendum Note (Signed)
Addended by: Aniceto Boss A on: 06/10/2012 09:08 AM   Modules accepted: Orders

## 2012-06-10 NOTE — Progress Notes (Signed)
  Subjective:    Patient ID: [name], female    DOB: May 20, 1946, 66 y.o.   MRN: [id number]  HPI Here for 5 days of stuffy head, PND, ST, and a dry cough. No fever.  Review of Systems  Constitutional: Negative.   HENT: Positive for congestion, postnasal drip and sinus pressure.   Eyes: Negative.   Respiratory: Positive for cough.        Objective:   Physical Exam  Constitutional: She appears well-developed and well-nourished.  HENT:  Right Ear: External ear normal.  Left Ear: External ear normal.  Nose: Nose normal.  Mouth/Throat: Oropharynx is clear and moist. No oropharyngeal exudate.  Eyes: Conjunctivae normal are normal.  Neck: No thyromegaly present.  Pulmonary/Chest: Effort normal and breath sounds normal.  Lymphadenopathy:    She has no cervical adenopathy.          Assessment & Plan:  Add Mucinex

## 2012-07-16 ENCOUNTER — Other Ambulatory Visit: Payer: Self-pay | Admitting: Cardiovascular Disease

## 2012-08-08 ENCOUNTER — Ambulatory Visit (INDEPENDENT_AMBULATORY_CARE_PROVIDER_SITE_OTHER): Payer: Medicare Other | Admitting: Family Medicine

## 2012-08-08 ENCOUNTER — Encounter: Payer: Self-pay | Admitting: Family Medicine

## 2012-08-08 VITALS — BP 150/74 | HR 58 | Temp 98.6°F | Wt 204.0 lb

## 2012-08-08 DIAGNOSIS — S139XXA Sprain of joints and ligaments of unspecified parts of neck, initial encounter: Secondary | ICD-10-CM

## 2012-08-08 DIAGNOSIS — R51 Headache: Secondary | ICD-10-CM

## 2012-08-08 DIAGNOSIS — S161XXA Strain of muscle, fascia and tendon at neck level, initial encounter: Secondary | ICD-10-CM

## 2012-08-08 MED ORDER — CYCLOBENZAPRINE HCL 10 MG PO TABS: 10.0000 mg | ORAL_TABLET | Freq: Three times a day (TID) | ORAL | Status: AC | PRN

## 2012-08-08 MED ORDER — KETOROLAC TROMETHAMINE 60 MG/2ML IM SOLN
60.0000 mg | Freq: Once | INTRAMUSCULAR | Status: AC
  Administered 2012-08-08: 60 mg via INTRAMUSCULAR

## 2012-08-08 MED ORDER — HYDROCODONE-ACETAMINOPHEN 5-325 MG PO TABS: 1.0000 | ORAL_TABLET | Freq: Four times a day (QID) | ORAL | Status: DC | PRN
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
## 2012-08-11 ENCOUNTER — Other Ambulatory Visit: Payer: Self-pay | Admitting: Family Medicine

## 2012-08-11 NOTE — Telephone Encounter (Signed)
Pt is having the same symptoms as before (06/10/12) when she was in for throat issues  Would like  another ZPAK.  Pharm: Human resources officer

## 2012-08-12 MED ORDER — AZITHROMYCIN 250 MG PO TABS: ORAL_TABLET | ORAL | Status: DC

## 2012-08-12 NOTE — Telephone Encounter (Signed)
I sent script e-scribe, tried to reach pt by phone, no answer.

## 2012-08-12 NOTE — Telephone Encounter (Signed)
Call in a Zpack  ?

## 2012-08-14 ENCOUNTER — Other Ambulatory Visit: Payer: Self-pay | Admitting: *Deleted

## 2012-08-14 MED ORDER — ISOSORBIDE MONONITRATE ER 60 MG PO TB24: 60.0000 mg | ORAL_TABLET | Freq: Every day | ORAL | Status: DC

## 2012-09-02 ENCOUNTER — Telehealth: Payer: Self-pay | Admitting: Family Medicine

## 2012-09-02 DIAGNOSIS — M542 Cervicalgia: Secondary | ICD-10-CM

## 2012-09-02 NOTE — Telephone Encounter (Signed)
The referral was done  
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
## 2012-09-02 NOTE — Telephone Encounter (Signed)
I spoke with pt  

## 2012-09-06 ENCOUNTER — Other Ambulatory Visit: Payer: Self-pay | Admitting: Family Medicine

## 2012-09-16 ENCOUNTER — Ambulatory Visit: Payer: No Typology Code available for payment source | Attending: Family Medicine | Admitting: Physical Therapy

## 2012-09-16 DIAGNOSIS — M25519 Pain in unspecified shoulder: Secondary | ICD-10-CM | POA: Insufficient documentation

## 2012-09-16 DIAGNOSIS — IMO0001 Reserved for inherently not codable concepts without codable children: Secondary | ICD-10-CM | POA: Insufficient documentation

## 2012-09-16 DIAGNOSIS — M542 Cervicalgia: Secondary | ICD-10-CM | POA: Insufficient documentation

## 2012-09-23 ENCOUNTER — Ambulatory Visit: Payer: No Typology Code available for payment source | Admitting: Physical Therapy

## 2012-09-25 ENCOUNTER — Encounter: Payer: Self-pay | Admitting: Physical Therapy

## 2012-09-30 ENCOUNTER — Ambulatory Visit: Payer: No Typology Code available for payment source | Admitting: Physical Therapy

## 2012-10-02 ENCOUNTER — Ambulatory Visit: Payer: No Typology Code available for payment source | Admitting: Rehabilitation

## 2012-10-07 ENCOUNTER — Ambulatory Visit: Payer: No Typology Code available for payment source | Attending: Family Medicine | Admitting: Physical Therapy

## 2012-10-07 DIAGNOSIS — IMO0001 Reserved for inherently not codable concepts without codable children: Secondary | ICD-10-CM | POA: Insufficient documentation

## 2012-10-07 DIAGNOSIS — M25519 Pain in unspecified shoulder: Secondary | ICD-10-CM | POA: Insufficient documentation

## 2012-10-07 DIAGNOSIS — M542 Cervicalgia: Secondary | ICD-10-CM | POA: Insufficient documentation

## 2012-10-09 ENCOUNTER — Ambulatory Visit: Payer: No Typology Code available for payment source | Admitting: Rehabilitation

## 2012-10-14 ENCOUNTER — Ambulatory Visit: Payer: No Typology Code available for payment source | Admitting: Rehabilitation

## 2012-10-16 ENCOUNTER — Ambulatory Visit (INDEPENDENT_AMBULATORY_CARE_PROVIDER_SITE_OTHER): Payer: Medicare Other | Admitting: Family Medicine

## 2012-10-16 ENCOUNTER — Ambulatory Visit: Payer: No Typology Code available for payment source | Admitting: Rehabilitation

## 2012-10-16 ENCOUNTER — Encounter: Payer: Self-pay | Admitting: Family Medicine

## 2012-10-16 VITALS — BP 158/80 | HR 63 | Temp 98.3°F | Wt 206.0 lb

## 2012-10-16 DIAGNOSIS — M542 Cervicalgia: Secondary | ICD-10-CM

## 2012-10-16 MED ORDER — HYDROCODONE-ACETAMINOPHEN 5-325 MG PO TABS: 1.0000 | ORAL_TABLET | Freq: Four times a day (QID) | ORAL | Status: DC | PRN

## 2012-10-16 MED ORDER — KETOROLAC TROMETHAMINE 60 MG/2ML IM SOLN
60.0000 mg | Freq: Once | INTRAMUSCULAR | Status: AC
  Administered 2012-10-16: 60 mg via INTRAMUSCULAR

## 2012-10-16 NOTE — Addendum Note (Signed)
Addended by: Aniceto Boss A on: 10/16/2012 12:07 PM   Modules accepted: Orders
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
## 2012-10-24 ENCOUNTER — Ambulatory Visit (INDEPENDENT_AMBULATORY_CARE_PROVIDER_SITE_OTHER): Payer: Medicare Other | Admitting: Internal Medicine

## 2012-10-24 ENCOUNTER — Encounter: Payer: Self-pay | Admitting: Internal Medicine

## 2012-10-24 VITALS — BP 142/70 | HR 71 | Temp 100.1°F | Ht 64.5 in | Wt 200.5 lb

## 2012-10-24 DIAGNOSIS — J209 Acute bronchitis, unspecified: Secondary | ICD-10-CM

## 2012-10-24 DIAGNOSIS — E119 Type 2 diabetes mellitus without complications: Secondary | ICD-10-CM

## 2012-10-24 DIAGNOSIS — I10 Essential (primary) hypertension: Secondary | ICD-10-CM

## 2012-10-24 MED ORDER — LEVOFLOXACIN 250 MG PO TABS: 250.0000 mg | ORAL_TABLET | Freq: Every day | ORAL | Status: DC

## 2012-10-24 MED ORDER — HYDROCODONE-HOMATROPINE 5-1.5 MG/5ML PO SYRP: 5.0000 mL | ORAL_SOLUTION | Freq: Four times a day (QID) | ORAL | Status: DC | PRN

## 2012-10-24 NOTE — Progress Notes (Signed)
Subjective:    Patient ID: [name], female    DOB: 04/10/1946, 67 y.o.   MRN: [id number]  HPI  Here with acute onset mild to mod 2-3 days ST, HA, general weakness and malaise, with prod cough greenish sputum, but Pt denies chest pain, increased sob or doe, wheezing, orthopnea, PND, increased LE swelling, palpitations, dizziness or syncope.   Pt denies polydipsia, polyuria, or low sugar symptoms such as weakness or confusion improved with po intake.  Pt states overall good compliance with meds, trying to follow lower cholesterol, diabetic diet.  Pt denies new neurological symptoms such as new headache, or facial or extremity weakness or numbness Past Medical History  Diagnosis Date  . CAD (coronary artery disease)     sees. Dr. Excell Seltzer. s/p cath with PCI of RCA (xience des) June 2010. Pt also with LAD and D2 dzs-NL FFR in both areas med Rx  . HTN (hypertension)   . Hyperlipidemia   . TIA (transient ischemic attack)     also Hx of it.   Marland Kitchen GERD (gastroesophageal reflux disease)   . Headache   . Anemia, unspecified   . Edema   . Depressive disorder, not elsewhere classified   . Low back pain   . Obesity   . Duodenal nodule   . Hemorrhoids   . Arthritis   . Glaucoma     narrow angle, sees Dr. Elmer Picker   . Type II or unspecified type diabetes mellitus without mention of complication, not stated as uncontrolled     sees Dr. Ardyth Harps   . Unspecified asthma     no per pt  . Gastric polyps     COLON  . Allergy    Past Surgical History  Procedure Laterality Date  . Back surgery    . Bilateral carpal tunnel release    . Colonoscopy  05-29-11    per Dr. Marina Goodell, benign polyps, repeat in 5 yrs    . Esophagogastroduodenoscopy  02-08-06    per Dr. Marina Goodell, gastritis   . Glaucoma surgery      with laser, per Dr. Marina Goodell   . Abdominal hysterectomy  age 25    TAH and USO  Leiomyomata  . Carotid endarterectomy  march 2012    per Dr. Fabienne Bruns    reports that she has quit smoking.  She has never used smokeless tobacco. She reports that she does not drink alcohol or use illicit drugs. family history includes Cancer in her brother and father and Hypertension in her brother, mother, and sister.  There is no history of Coronary artery disease, and Colon cancer, and Esophageal cancer, and Stomach cancer, . No Known Allergies Current Outpatient Prescriptions on File Prior to Visit  Medication Sig Dispense Refill  . aspirin 81 MG tablet Take 81 mg by mouth daily.       . CRESTOR 40 MG tablet TAKE ONE TABLET BY MOUTH EVERY DAY  30 tablet  5  . diclofenac sodium (VOLTAREN) 1 % GEL Apply 1 application topically as needed.        . ergocalciferol (VITAMIN D2) 50000 UNITS capsule Take 50,000 Units by mouth once a week.       . furosemide (LASIX) 40 MG tablet Take 1 tablet (40 mg total) by mouth daily.  30 tablet  6  . glucose blood test strip 1 each by Other route as needed. Use as instructed       . hydrALAZINE (APRESOLINE) 100 MG tablet TAKE ONE  TABLET BY MOUTH THREE TIMES DAILY  90 tablet  6  . HYDROcodone-acetaminophen (NORCO/VICODIN) 5-325 MG per tablet Take 1 tablet by mouth every 6 (six) hours as needed for pain.  60 tablet  2  . insulin aspart (NOVOLOG) 100 UNIT/ML injection Inject 5 Units into the skin 3 (three) times daily before meals.        . insulin glargine (LANTUS) 100 UNIT/ML injection Inject 30 Units into the skin 2 (two) times daily.       . irbesartan (AVAPRO) 300 MG tablet TAKE ONE TABLET BY MOUTH EVERY DAY  30 tablet  5  . isosorbide mononitrate (IMDUR) 60 MG 24 hr tablet Take 1 tablet (60 mg total) by mouth daily.  30 tablet  6  . KLOR-CON M20 20 MEQ tablet TAKE ONE TABLET BY MOUTH EVERY DAY  30 tablet  5  . latanoprost (XALATAN) 0.005 % ophthalmic solution Place 1 drop into both eyes daily.       . meloxicam (MOBIC) 15 MG tablet Take 15 mg by mouth daily.       . metoprolol succinate (TOPROL-XL) 100 MG 24 hr tablet TAKE ONE TABLET (100MG  TOTAL) BY MOUTH EVERY  DAY  30 tablet  9  . nitroGLYCERIN (NITROSTAT) 0.4 MG SL tablet Place 0.4 mg under the tongue every 5 (five) minutes as needed.        . pantoprazole (PROTONIX) 40 MG tablet Take 1 tablet (40 mg total) by mouth daily.  30 tablet  9   No current facility-administered medications on file prior to visit.   Review of Systems  Constitutional: Negative for unexpected weight change, or unusual diaphoresis  HENT: Negative for tinnitus.   Eyes: Negative for photophobia and visual disturbance.  Respiratory: Negative for choking and stridor.   Gastrointestinal: Negative for vomiting and blood in stool.  Genitourinary: Negative for hematuria and decreased urine volume.  Musculoskeletal: Negative for acute joint swelling Skin: Negative for color change and wound.  Neurological: Negative for tremors and numbness other than noted  Psychiatric/Behavioral: Negative for decreased concentration or  hyperactivity.       Objective:   Physical Exam BP 142/70  Pulse 71  Temp(Src) 100.1 F (37.8 C) (Oral)  Ht 5' 4.5" (1.638 m)  Wt 200 lb 8 oz (90.946 kg)  BMI 33.9 kg/m2  SpO2 96% VS noted, mild ill Constitutional: Pt appears well-developed and well-nourished.  HENT: Head: NCAT.  Right Ear: External ear normal.  Left Ear: External ear normal.  Bilat tm's with mild erythema.  Max sinus areas non tender.  Pharynx with mild erythema, no exudate Eyes: Conjunctivae and EOM are normal. Pupils are equal, round, and reactive to light.  Neck: Normal range of motion. Neck supple.  Cardiovascular: Normal rate and regular rhythm.   Pulmonary/Chest: Effort normal and breath sounds normal.  Abd:  Soft, NT, non-distended, + BS Neurological: Pt is alert. Not confused  Skin: Skin is warm. No erythema.  Psychiatric: Pt behavior is normal. Thought content normal.     Assessment & Plan:

## 2012-10-24 NOTE — Assessment & Plan Note (Signed)
stable overall by history and exam, recent data reviewed with pt, and pt to continue medical treatment as before,  to f/u any worsening symptoms or concerns BP Readings from Last 3 Encounters:  10/24/12 142/70  10/16/12 158/80  08/08/12 150/74

## 2012-10-24 NOTE — Assessment & Plan Note (Signed)
Mild to mod, for antibx course,  to f/u any worsening symptoms or concerns 

## 2012-10-24 NOTE — Patient Instructions (Signed)
Please take all new medication as prescribed Please continue all other medications as before, and refills have been done if requested. Thank you for enrolling in MyChart. Please follow the instructions below to securely access your online medical record. MyChart allows you to send messages to your doctor, view your test results, renew your prescriptions, schedule appointments, and more. To Log into My Chart online, please go by [employer] or [employer] to [email or web address], or download the MyChart App from the [employer] .  Your Username is: [email or web address] (pass [email or web address])

## 2012-10-24 NOTE — Assessment & Plan Note (Signed)
stable overall by history and exam, recent data reviewed with pt, and pt to continue medical treatment as before,  to f/u any worsening symptoms or concerns Lab Results  Component Value Date   HGBA1C 7.1* 03/21/2012

## 2012-10-27 ENCOUNTER — Telehealth: Payer: Self-pay | Admitting: Family Medicine

## 2012-10-27 NOTE — Telephone Encounter (Signed)
[facility] Triage Call Report Triage Record Num: [id number] Operator: Griselda Miner Patient Name: [name] Call Date & Time: 10/23/2012 5:49:21PM Patient Phone: [phone] PCP: Tera Mater. Clent Ridges Patient Gender: Female PCP Fax : [phone] Patient DOB: Jul 09, 1946 [facility] Name[facility] Reason for Call: Caller: Bryanda/Patient; PCP: Gershon Crane [facility]); CB#: [phone]; Call regarding Sore Throat; Onset of sore throat was 10/23/12--feels like fuzz in her throat. Has a little sore throat--just feels like something is in her throat. Nasal drainage is clear and coughs -this started last night; Afebrile; Appetite and and fluid intake fine per patient. Rates throat discomfort as a 6-7/10 pain scale. no emergent symptoms assessed. Triage using Sore throat or hoarseness with a disposition of home care. Care advice given. Patient wants to have a Z-pack called her for her. RN explained that policy was for patients to be seen. RN offered to schedule her an appointment-patient said she was tired and jus wanted to go to sleep. She said she would call the office back in the morning for an appointment or to request medication. Protocol(s) Used: Sore Throat or Hoarseness Recommended Outcome per Protocol: Provide Home/Self Care Reason for Outcome: All other situations Care Advice: ~ Use a cool mist humidifier to moisten air. Be sure to clean according to manufacturer's instructions. Drink more fluids -- water, low-sugar juices, tea and warm soup, especially chicken broth, are options. Avoid caffeinated or alcoholic beverages because they can increase the chance of dehydration. ~ Sore Throat Relief: - Use salt water gargles (1/2 teaspoon salt in 8 oz. [265mL] warm water) every one to two hours. - Use a vaporizer or cool mist humidifier in the room when sleeping. - Suck on hard candy, nonprescription or herbal throat lozenges (sugar-free if diabetic) - Eat soothing, soft  food/fluids (broths, soups, or honey and lemon juice in hot tea, Popsicles, frozen yogurt or sherbet, scrambled eggs, cooked cereals, Jell-O or puddings) whichever is most comforting. - Avoid eating salty, spicy or acidic foods. ~ 04/

## 2012-10-27 NOTE — Telephone Encounter (Signed)
[facility] Triage Call Report Triage Record Num: [id number] Operator: Hyman Bower Patient Name: [name] Call Date & Time: 10/24/2012 9:31:15AM Patient Phone: [phone] PCP: Tera Mater. Clent Ridges Patient Gender: Female PCP Fax : [phone] Patient DOB: 12-Jul-1945 [facility] Name: [facility] Reason for Call: Caller: Cyara/Patient; PCP: Gershon Crane [facility]); CB#: [phone]; Call regarding Cough/Congestion; Triaged 10/23/12 at 5:49pm. Was advised to call and schedule appointment this morning. Appointment scheduled 10/24/12 at 12:30pm. Address and phone number for [facility] given to patient. Protocol(s) Used: Office Note Recommended Outcome per Protocol: Information Noted and Sent to Office Reason for Outcome: Caller information to office Care Advice: ~ 04/

## 2012-10-28 ENCOUNTER — Telehealth: Payer: Self-pay | Admitting: Family Medicine

## 2012-10-28 NOTE — Telephone Encounter (Signed)
[facility] Triage Call Report Triage Record Num: [id number] Operator: Tomasita Crumble Patient Name: [name] Call Date & Time: 10/27/2012 5:42:04PM Patient Phone: [phone] PCP: Tera Mater. Clent Ridges Patient Gender: Female PCP Fax : [phone] Patient DOB: 04-28-1946 Practice Name: [facility] Reason for Call: Caller: [name]/Patient; PCP: Gershon Crane (Family Practice); CB#: [phone]; Call regarding Seen 4/19 by Dr. Jonny Ruiz.; was not given shot and Z-Pack like Dr. Clent Ridges usually does. She states she does not feel well; she states, "I just want my Z-Pack so I can get better." Reports headache since 10/24/12, cough producing some white or clear sputum. Afebrile/subjective. She was given Hycodan and Levaquin per EMR. She reports she has difficulty sleeping at night with the nasal congestion. She continues to emphasize that she would be fine if she just had the Z-Pack. She is using humidifier, sleeps on 2 pillows. Caller asked i fZ-Pack would be called in today and disconnected when she was told it may not be. Provider on call paged per Upper Respiratory Infection protocol. Dr. Ronna Polio on call; she ordered that patient needs to complete the Levaqin and that the illness may be viral which will not respond to any antibiotics. Caller informed of same. Protocol(s) Used: Upper Respiratory Infection (URI) Recommended Outcome per Protocol: Call Provider within 8 Hours Reason for Outcome: Being treated by a provider for a secondary infection AND no improvement in symptoms, symptoms have worsened OR has new symptoms after following treatment plan for the time specified by provider. All other situations Care Advice: ~ 04/

## 2012-10-31 ENCOUNTER — Ambulatory Visit (INDEPENDENT_AMBULATORY_CARE_PROVIDER_SITE_OTHER): Payer: Medicare Other | Admitting: Cardiovascular Disease

## 2012-10-31 ENCOUNTER — Ambulatory Visit
Admission: RE | Admit: 2012-10-31 | Discharge: 2012-10-31 | Disposition: A | Discharge status: Home / Self Care | Payer: Medicare Other | Source: Ambulatory Visit | Attending: Family Medicine | Admitting: Family Medicine

## 2012-10-31 ENCOUNTER — Encounter: Payer: Self-pay | Admitting: Cardiovascular Disease

## 2012-10-31 VITALS — BP 164/68 | HR 75 | Ht 64.5 in | Wt 199.0 lb

## 2012-10-31 DIAGNOSIS — E785 Hyperlipidemia, unspecified: Secondary | ICD-10-CM

## 2012-10-31 DIAGNOSIS — I10 Essential (primary) hypertension: Secondary | ICD-10-CM

## 2012-10-31 DIAGNOSIS — I2510 Atherosclerotic heart disease of native coronary artery without angina pectoris: Secondary | ICD-10-CM

## 2012-10-31 DIAGNOSIS — M542 Cervicalgia: Secondary | ICD-10-CM

## 2012-10-31 MED ORDER — SPIRONOLACTONE 25 MG PO TABS: 25.0000 mg | ORAL_TABLET | Freq: Every day | ORAL | Status: DC

## 2012-10-31 NOTE — Patient Instructions (Addendum)
Your physician has recommended you make the following change in your medication: START Aldactone 25mg  take one by mouth daily, STOP Potassium Chloride (Klor-con)   Your physician recommends that you return for lab work in: 2 WEEKS (BMP)  Your physician wants you to follow-up in: 1 YEAR with Dr Excell Seltzer.  You will receive a reminder letter in the mail two months in advance. If you don't receive a letter, please call our office to schedule the follow-up appointment.

## 2012-10-31 NOTE — Progress Notes (Signed)
HPI:  67 year-old woman presenting for followup evaluation. She was last seen 6 months ago. She presented initially in 2010 with unstable angina and underwent stenting of the right coronary artery and pressure wire analysis of the diagonal branch of the LAD. She's had no recurrent ischemic events. She's had difficulty with malignant hypertension despite multiple antihypertensive meds. We explored referral to [facility] for a renal denervation trial but she didn't meet inclusion criteria.   She returns today alone. She is doing well. Denies chest pain, dyspnea, edema. No palpitations or lightheadedness. Stress about her son continues.  Outpatient Encounter Prescriptions as of 10/31/2012  Medication Sig Dispense Refill  . aspirin 81 MG tablet Take 81 mg by mouth daily.       . CRESTOR 40 MG tablet TAKE ONE TABLET BY MOUTH EVERY DAY  30 tablet  5  . diclofenac sodium (VOLTAREN) 1 % GEL Apply 1 application topically as needed.        . ergocalciferol (VITAMIN D2) 50000 UNITS capsule Take 50,000 Units by mouth once a week.       . furosemide (LASIX) 40 MG tablet Take 1 tablet (40 mg total) by mouth daily.  30 tablet  6  . glucose blood test strip 1 each by Other route as needed. Use as instructed       . hydrALAZINE (APRESOLINE) 100 MG tablet TAKE ONE TABLET BY MOUTH THREE TIMES DAILY  90 tablet  6  . HYDROcodone-acetaminophen (NORCO/VICODIN) 5-325 MG per tablet Take 1 tablet by mouth every 6 (six) hours as needed for pain.  60 tablet  2  . HYDROcodone-homatropine (HYCODAN) 5-1.5 MG/5ML syrup Take 5 mLs by mouth every 6 (six) hours as needed for cough.  120 mL  1  . insulin aspart (NOVOLOG) 100 UNIT/ML injection Inject 5 Units into the skin 3 (three) times daily before meals.        . insulin glargine (LANTUS) 100 UNIT/ML injection Inject 30 Units into the skin 2 (two) times daily.       . irbesartan (AVAPRO) 300 MG tablet TAKE ONE TABLET BY MOUTH EVERY DAY  30 tablet  5  . isosorbide mononitrate (IMDUR) 60  MG 24 hr tablet Take 1 tablet (60 mg total) by mouth daily.  30 tablet  6  . KLOR-CON M20 20 MEQ tablet TAKE ONE TABLET BY MOUTH EVERY DAY  30 tablet  5  . latanoprost (XALATAN) 0.005 % ophthalmic solution Place 1 drop into both eyes daily.       Marland Kitchen levofloxacin (LEVAQUIN) 250 MG tablet Take 1 tablet (250 mg total) by mouth daily.  10 tablet  0  . meloxicam (MOBIC) 15 MG tablet Take 15 mg by mouth daily.       . metoprolol succinate (TOPROL-XL) 100 MG 24 hr tablet TAKE ONE TABLET (100MG  TOTAL) BY MOUTH EVERY DAY  30 tablet  9  . nitroGLYCERIN (NITROSTAT) 0.4 MG SL tablet Place 0.4 mg under the tongue every 5 (five) minutes as needed.        . pantoprazole (PROTONIX) 40 MG tablet Take 1 tablet (40 mg total) by mouth daily.  30 tablet  9   No facility-administered encounter medications on file as of 10/31/2012.    No Known Allergies  Past Medical History  Diagnosis Date  . CAD (coronary artery disease)     sees. Dr. Excell Seltzer. s/p cath with PCI of RCA (xience des) June 2010. Pt also with LAD and D2 dzs-NL FFR in both  areas med Rx  . HTN (hypertension)   . Hyperlipidemia   . TIA (transient ischemic attack)     also Hx of it.   Marland Kitchen GERD (gastroesophageal reflux disease)   . Headache   . Anemia, unspecified   . Edema   . Depressive disorder, not elsewhere classified   . Low back pain   . Obesity   . Duodenal nodule   . Hemorrhoids   . Arthritis   . Glaucoma     narrow angle, sees Dr. Elmer Picker   . Type II or unspecified type diabetes mellitus without mention of complication, not stated as uncontrolled     sees Dr. Ardyth Harps   . Unspecified asthma     no per pt  . Gastric polyps     COLON  . Allergy     ROS: Negative except as per HPI  BP 164/68  Pulse 75  Ht 5' 4.5" (1.638 m)  Wt 90.266 kg (199 lb)  BMI 33.64 kg/m2  SpO2 99%  PHYSICAL EXAM: Pt is alert and oriented, NAD HEENT: normal Neck: JVP - normal, carotids 2+= with a right carotid bruit Lungs: CTA bilaterally CV: RRR  without murmur or gallop Abd: soft, NT, Positive BS, no hepatomegaly Ext: no C/C/E, distal pulses intact and equal Skin: warm/dry no rash  EKG:  Normal sinus rhythm 75 beats per minute, within normal limits.  ASSESSMENT AND PLAN: 1. CAD, native vessel. Stable without angina. Continue ASA, statin. Follow-up 1 year.  2. Hypertension, malignant. Meds carefully reviewed and past hx of med intolerance reviewed (cannot take amlodipine secondary to swelling). Add aldactone 25 mg, stop KDur, and repeat BMET in 2 weeks.  3. Hyperlipidemia. Lipids 03/2012 reviewed and at goal with LDL 52.  4. Carotid stenosis, hx CEA. Followed by [facility].  Tonny Bollman 11/02/2012 7:56 AM

## 2012-10-31 NOTE — Addendum Note (Signed)
Addended by: Gershon Crane A on: 10/31/2012 05:35 PM   Modules accepted: Orders

## 2012-11-03 NOTE — Progress Notes (Signed)
Quick Note:  I left voice message and released results in my chart. ______ 

## 2012-11-12 ENCOUNTER — Other Ambulatory Visit: Payer: Medicare Other

## 2012-11-12 ENCOUNTER — Telehealth: Payer: Self-pay | Admitting: Cardiovascular Disease

## 2012-11-12 NOTE — Telephone Encounter (Signed)
New problem   Pt want you to know the new medication she was put on Spironolact 25mg   isn't helping her at all, she isn't going to the bathroom. Please call pt.

## 2012-11-12 NOTE — Telephone Encounter (Signed)
Left message to call back  

## 2012-11-13 ENCOUNTER — Telehealth: Payer: Self-pay | Admitting: Family Medicine

## 2012-11-13 NOTE — Telephone Encounter (Signed)
Patient called to request results of her MRI (10/31/12). She would like you to leave a message on her home number. Please assist.

## 2012-11-13 NOTE — Telephone Encounter (Signed)
Follow up    Returning call back to nurse from yesterday  

## 2012-11-13 NOTE — Telephone Encounter (Signed)
I left a message for the pt to call  back

## 2012-11-14 ENCOUNTER — Other Ambulatory Visit: Payer: Medicare Other

## 2012-11-14 NOTE — Telephone Encounter (Signed)
Spoke with patient.

## 2012-11-18 ENCOUNTER — Ambulatory Visit (INDEPENDENT_AMBULATORY_CARE_PROVIDER_SITE_OTHER): Payer: Medicare Other | Admitting: Gynecology

## 2012-11-18 ENCOUNTER — Encounter: Payer: Self-pay | Admitting: Gynecology

## 2012-11-18 VITALS — BP 130/80 | Ht 65.0 in | Wt 196.0 lb

## 2012-11-18 DIAGNOSIS — N907 Vulvar cyst: Secondary | ICD-10-CM

## 2012-11-18 DIAGNOSIS — N952 Postmenopausal atrophic vaginitis: Secondary | ICD-10-CM

## 2012-11-18 DIAGNOSIS — N9089 Other specified noninflammatory disorders of vulva and perineum: Secondary | ICD-10-CM

## 2012-11-18 NOTE — Patient Instructions (Signed)
Follow up in one year for annual exam 

## 2012-11-18 NOTE — Progress Notes (Signed)
[name] [birth year] [id number]        67 y.o.  G2P1011 for followup exam.  Several issues noted below.  Past medical history,surgical history, medications, allergies, family history and social history were all reviewed and documented in the EPIC chart. ROS:  Was performed and pertinent positives and negatives are included in the history.  Exam: Kim assistant Filed Vitals:   11/18/12 1043  BP: 130/80  Height: 5\' 5"  (1.651 m)  Weight: 196 lb (88.905 kg)   General appearance  Normal Skin grossly normal Head/Neck normal with no cervical or supraclavicular adenopathy thyroid normal Lungs  clear Cardiac RR, without RMG Abdominal  soft, nontender, without masses, organomegaly or hernia Breasts  examined lying and sitting without masses, retractions, discharge or axillary adenopathy. Pelvic  Ext/BUS/vagina  normal with atrophic changes. Multiple classic sebaceous cysts bilateral labia majora  Adnexa  Without masses or tenderness    Anus and perineum  normal   Rectovaginal  normal sphincter tone without palpated masses or tenderness.    Assessment/Plan:  67 y.o. G37P1011 female for annual exam.   1. Multiple small sebaceous cysts. Patient has multiple classic appearing sebaceous cysts on both labia majora. They've been there for years historically and do not bother her. She'll continue to observe them and as long as they remain stable and noninflammatory then she'll monitor. 2. Atrophic vaginal changes/history of hysterectomy. Patient had a TAH USO for leiomyoma and bleeding. She's not sure which ovary was left, in fact she's not exactly sure if any ovaries were left. Her exam is normal today with the exception of atrophic vaginal changes. She's not having any issues from a menopausal standpoint such as vaginal dryness or irritation, hot flashes or night sweats. We will continue to monitor. 3. History of HSV 2. Patient has positive culture from 2007 when she had a vulvar lesion. She's  had no recurrences. She'll report if she does start to have any. 4. Pap smear. She has several normal Pap smear report in her chart, the last in 2011. I discussed current screening guidelines and I did not do a Pap smear today. She is status post hysterectomy and has no history of abnormal Paps historically. Discussed less frequent screening interval or stop it altogether and we'll readdress on an annual basis. 5. Mammography 04/2012. Repeat mammogram this coming fall. SBE monthly review. 6. Colonoscopy. Patient had her colonoscopy in November 2012. She will follow up with their recommended interval. 7. DEXA 07/2011 normal. Repeat at five-year interval. Increased calcium vitamin D reviewed. 8. Health maintenance. She's been followed for a number of medical issues and actively sees Dr. Clent Ridges. No lab work was done today this was all done through his office.    Dara Lords MD, 11:12 AM 11/18/2012

## 2012-11-24 NOTE — Telephone Encounter (Signed)
I will close encounter at this time.  The pt has not called back.

## 2012-12-09 ENCOUNTER — Other Ambulatory Visit: Payer: Self-pay

## 2012-12-09 MED ORDER — PANTOPRAZOLE SODIUM 40 MG PO TBEC: 40.0000 mg | DELAYED_RELEASE_TABLET | Freq: Every day | ORAL | Status: DC

## 2012-12-25 ENCOUNTER — Telehealth: Payer: Self-pay | Admitting: Family Medicine

## 2012-12-25 NOTE — Telephone Encounter (Signed)
Pt called stating she was told go on MyChart concerning results of labs.  Pt needs activation code, does not have.  Unable to give on the phone. Pt would like a call from you.

## 2012-12-26 ENCOUNTER — Other Ambulatory Visit: Payer: Medicare Other

## 2012-12-26 ENCOUNTER — Encounter: Payer: Self-pay | Admitting: Family Medicine

## 2012-12-26 NOTE — Telephone Encounter (Addendum)
FYI: I printed a MY CHART activation letter and it states code not generated, she is an active pt on my chart. She should have her activation code!!

## 2012-12-26 NOTE — Telephone Encounter (Signed)
Pt is already signed up in my chart, can you give her the number to call, so that she can get her code again. They might have to talk her through this?

## 2012-12-29 ENCOUNTER — Other Ambulatory Visit (INDEPENDENT_AMBULATORY_CARE_PROVIDER_SITE_OTHER): Payer: Medicare Other

## 2012-12-29 DIAGNOSIS — I2510 Atherosclerotic heart disease of native coronary artery without angina pectoris: Secondary | ICD-10-CM

## 2012-12-29 DIAGNOSIS — E785 Hyperlipidemia, unspecified: Secondary | ICD-10-CM

## 2012-12-29 DIAGNOSIS — I10 Essential (primary) hypertension: Secondary | ICD-10-CM

## 2012-12-29 LAB — BASIC METABOLIC PANEL
CO2: 28 mEq/L (ref 19–32)
Chloride: 107 mEq/L (ref 96–112)
Glucose, Bld: 183 mg/dL — ABNORMAL HIGH (ref 70–99)
Potassium: 3.9 mEq/L (ref 3.5–5.1)
Sodium: 142 mEq/L (ref 135–145)

## 2013-01-09 ENCOUNTER — Other Ambulatory Visit: Payer: Self-pay | Admitting: Cardiovascular Disease

## 2013-01-22 ENCOUNTER — Telehealth: Payer: Self-pay | Admitting: Cardiovascular Disease

## 2013-01-22 ENCOUNTER — Telehealth: Payer: Self-pay | Admitting: Family Medicine

## 2013-01-22 NOTE — Telephone Encounter (Signed)
Reviewed all cardiac medications & price list with pt. States she was told by [facility] she would have to pay full price since in the donut-hole. She was not told by [facility] about their $4/$10 plan  Spoke with her about possibly transferring some of her prescriptions to [facility] or [facility] who offered more affordable prices.  She is interested in switching from Toprol to metoprolol tartrate  She would like Lauren to give her more information about Crestor assistance program. I looked this up but she would like to talk with Lauren about it.  States she will talk with [facility] in person tomorrow about the pricing of her meds & call Lauren back if she would like to make changes.  Mylo Red RN

## 2013-01-22 NOTE — Telephone Encounter (Signed)
Pt is donut hole. Pt needs samples of novolog and lantus solostar

## 2013-01-22 NOTE — Telephone Encounter (Signed)
Samples are ready for pick up and in fridge in back. I spoke with pt.

## 2013-01-22 NOTE — Telephone Encounter (Signed)
New prob  Pt states that she has been put in a doughnut hole by her insurance. She wants to know if there are any assistance programs that can help with her meds because she has to pay full price for them now.

## 2013-01-26 NOTE — Telephone Encounter (Signed)
Left message on machine for pt to contact the office.   

## 2013-01-26 NOTE — Telephone Encounter (Signed)
Follow Up ° ° ° ° °Pt calling returning call from earlier. Please call back. °

## 2013-01-26 NOTE — Telephone Encounter (Signed)
I spoke with the pt and she has not contacted [facility] about the $4/$10 plan.  The pt would also like information about the patient assistance program for Crestor.  I have printed out an application for [employer] and [employer] and will mail this information to the patient per her request.

## 2013-02-09 ENCOUNTER — Other Ambulatory Visit: Payer: Self-pay | Admitting: Cardiovascular Disease

## 2013-02-11 ENCOUNTER — Other Ambulatory Visit: Payer: Self-pay

## 2013-02-19 ENCOUNTER — Telehealth: Payer: Self-pay | Admitting: Family Medicine

## 2013-02-19 ENCOUNTER — Encounter: Payer: Self-pay | Admitting: Family Medicine

## 2013-02-19 NOTE — Telephone Encounter (Signed)
Samples available for pickup  

## 2013-02-19 NOTE — Telephone Encounter (Signed)
Pt needs more samples of lantus.

## 2013-02-20 NOTE — Telephone Encounter (Signed)
Pt has already picked up samples.

## 2013-03-05 ENCOUNTER — Telehealth: Payer: Self-pay | Admitting: Cardiovascular Disease

## 2013-03-05 ENCOUNTER — Ambulatory Visit: Payer: Self-pay | Admitting: Family

## 2013-03-05 ENCOUNTER — Telehealth: Payer: Self-pay | Admitting: Family Medicine

## 2013-03-05 ENCOUNTER — Other Ambulatory Visit (INDEPENDENT_AMBULATORY_CARE_PROVIDER_SITE_OTHER): Payer: Medicare Other | Admitting: *Deleted

## 2013-03-05 DIAGNOSIS — I6529 Occlusion and stenosis of unspecified carotid artery: Secondary | ICD-10-CM

## 2013-03-05 DIAGNOSIS — Z48812 Encounter for surgical aftercare following surgery on the circulatory system: Secondary | ICD-10-CM

## 2013-03-05 NOTE — Telephone Encounter (Signed)
New Problem   Pt asks was a packet of forms mailed for Medical assistance// and are there any samples of crestor that she may have she states she is out of medication.

## 2013-03-05 NOTE — Telephone Encounter (Signed)
Pt would like samples of lantus. Pt is out?

## 2013-03-05 NOTE — Telephone Encounter (Signed)
New Problem  Pt asks was a packet of forms mailed for medical assistance//and are there any samples of crestor that she may have. She states she is out of medication.

## 2013-03-05 NOTE — Telephone Encounter (Signed)
Left message on machine for pt to contact the office.   

## 2013-03-05 NOTE — Telephone Encounter (Signed)
PT IS AWARE

## 2013-03-05 NOTE — Telephone Encounter (Signed)
We do not have any samples, please call pt.

## 2013-03-06 ENCOUNTER — Other Ambulatory Visit: Payer: Self-pay | Admitting: *Deleted

## 2013-03-06 NOTE — Telephone Encounter (Signed)
I spoke with the pt and made her aware that samples will be placed at the front desk.  I also put pt assistance forms from [employer] in with samples.  Dr Excell Seltzer did complete his portion of these forms.

## 2013-03-06 NOTE — Telephone Encounter (Signed)
Follow up ° ° ° °Pt returning your call. Please call pt °

## 2013-03-12 ENCOUNTER — Encounter: Payer: Self-pay | Admitting: Family Medicine

## 2013-03-12 ENCOUNTER — Encounter: Payer: Self-pay | Admitting: Vascular Surgery

## 2013-03-12 ENCOUNTER — Ambulatory Visit (INDEPENDENT_AMBULATORY_CARE_PROVIDER_SITE_OTHER): Payer: Medicare Other | Admitting: Family Medicine

## 2013-03-12 VITALS — BP 130/64 | HR 78 | Temp 98.1°F | Wt 197.0 lb

## 2013-03-12 DIAGNOSIS — J069 Acute upper respiratory infection, unspecified: Secondary | ICD-10-CM

## 2013-03-12 MED ORDER — HYDROCODONE-HOMATROPINE 5-1.5 MG/5ML PO SYRP: 5.0000 mL | ORAL_SOLUTION | Freq: Four times a day (QID) | ORAL | Status: DC | PRN

## 2013-03-12 NOTE — Patient Instructions (Addendum)

## 2013-03-12 NOTE — Progress Notes (Signed)
  Subjective:    Patient ID: [name], female    DOB: 11-14-1945, 67 y.o.   MRN: [id number]  HPI  Acute visit. Patient seen today with 2 day history of malaise, hoarseness, productive cough, and clear rhinorrhea. Denies any sore throat. No nausea or vomiting. Cough is worse at night. Severe at times. No wheezing. No dyspnea. Son has similar symptoms several days ago. Patient requesting cough medication. She's taken Hycodan in the past. She is a nonsmoker. No history of reactive airway disease.  Past Medical History  Diagnosis Date  . CAD (coronary artery disease)     sees. Dr. Excell Seltzer. s/p cath with PCI of RCA (xience des) June 2010. Pt also with LAD and D2 dzs-NL FFR in both areas med Rx  . HTN (hypertension)   . Hyperlipidemia   . TIA (transient ischemic attack)     also Hx of it.   Marland Kitchen GERD (gastroesophageal reflux disease)   . Headache(784.0)   . Anemia, unspecified   . Edema   . Depressive disorder, not elsewhere classified   . Low back pain   . Obesity   . Duodenal nodule   . Hemorrhoids   . Arthritis   . Glaucoma     narrow angle, sees Dr. Elmer Picker   . Type II or unspecified type diabetes mellitus without mention of complication, not stated as uncontrolled     sees Dr. Ardyth Harps   . Gastric polyps     COLON  . Allergy    Past Surgical History  Procedure Laterality Date  . Back surgery    . Bilateral carpal tunnel release    . Colonoscopy  05-29-11    per Dr. Marina Goodell, benign polyps, repeat in 5 yrs    . Esophagogastroduodenoscopy  02-08-06    per Dr. Marina Goodell, gastritis   . Glaucoma surgery      with laser, per Dr. Marina Goodell   . Carotid endarterectomy  march 2012    per Dr. Fabienne Bruns  . Abdominal hysterectomy  age 45    TAH and USO  Leiomyomata    reports that she has quit smoking. She has never used smokeless tobacco. She reports that she does not drink alcohol or use illicit drugs. family history includes Cancer in her brother and father; Hypertension in  her brother, mother, and sister. There is no history of Coronary artery disease, Colon cancer, Esophageal cancer, or Stomach cancer. No Known Allergies   Review of Systems  Constitutional: Positive for fatigue. Negative for fever and chills.  HENT: Positive for congestion, rhinorrhea and voice change.   Respiratory: Positive for cough. Negative for shortness of breath and wheezing.   Cardiovascular: Negative for chest pain.  Neurological: Negative for headaches.       Objective:   Physical Exam  Constitutional: She appears well-developed and well-nourished.  HENT:  Right Ear: External ear normal.  Left Ear: External ear normal.  Nose: Nose normal.  Mouth/Throat: Oropharynx is clear and moist.  Minimal cerumen both canals  Neck: Neck supple. No thyromegaly present.  Cardiovascular: Normal rate.   Pulmonary/Chest: Effort normal and breath sounds normal. No respiratory distress. She has no wheezes. She has no rales.          Assessment & Plan:  Viral URI with cough. Hycodan cough syrup 1 teaspoon every 6 hours as needed for severe cough. Caution for possible sedation.

## 2013-03-19 ENCOUNTER — Telehealth: Payer: Self-pay | Admitting: Family Medicine

## 2013-03-19 NOTE — Telephone Encounter (Signed)
Pt would like samples of lantus or levemir/humalog

## 2013-03-20 NOTE — Telephone Encounter (Signed)
We did have samples and I left voice message for pt to pick up.

## 2013-04-09 ENCOUNTER — Telehealth: Payer: Self-pay | Admitting: Family Medicine

## 2013-04-09 NOTE — Telephone Encounter (Signed)
Pt needs samples of lantus or levemir.

## 2013-04-14 NOTE — Telephone Encounter (Signed)
I spoke with pt and she is going to check on line for some coupons.

## 2013-04-29 ENCOUNTER — Telehealth: Payer: Self-pay | Admitting: Family Medicine

## 2013-04-29 NOTE — Telephone Encounter (Signed)
Pt needs samples of lantus,novolog or levemir. Pharm would not honor coupon for insulin due to pt has medicare

## 2013-04-30 NOTE — Telephone Encounter (Signed)
We do not have any samples, we only get a few of these in each month. Advised pt to check on the intranet to look for any type for specials or help with getting the medication. I left a voice message with this information.

## 2013-05-06 ENCOUNTER — Telehealth: Payer: Self-pay | Admitting: Family Medicine

## 2013-05-06 NOTE — Telephone Encounter (Signed)
Pt left a voice message requesting more samples of insulin. We do not have any more to give, when we do get samples its only 1 pen in the box. I advised pt to check on line for any discounts and also she can call the Dept of Public Health, they have a medication assistance program and she can ask about this? I called pt and left a voice message with all of the above information.

## 2013-05-14 ENCOUNTER — Other Ambulatory Visit: Payer: Self-pay

## 2013-05-19 ENCOUNTER — Ambulatory Visit (INDEPENDENT_AMBULATORY_CARE_PROVIDER_SITE_OTHER): Payer: Medicare Other | Admitting: Family Medicine

## 2013-05-19 ENCOUNTER — Encounter: Payer: Self-pay | Admitting: Family Medicine

## 2013-05-19 VITALS — BP 150/70 | HR 80 | Temp 98.1°F | Wt 200.0 lb

## 2013-05-19 DIAGNOSIS — J40 Bronchitis, not specified as acute or chronic: Secondary | ICD-10-CM

## 2013-05-19 MED ORDER — METHYLPREDNISOLONE ACETATE 80 MG/ML IJ SUSP
120.0000 mg | Freq: Once | INTRAMUSCULAR | Status: AC
  Administered 2013-05-19: 120 mg via INTRAMUSCULAR

## 2013-05-19 MED ORDER — HYDROCODONE-HOMATROPINE 5-1.5 MG/5ML PO SYRP: 5.0000 mL | ORAL_SOLUTION | ORAL | Status: DC | PRN

## 2013-05-19 MED ORDER — AZITHROMYCIN 250 MG PO TABS: ORAL_TABLET | ORAL | Status: DC

## 2013-05-19 NOTE — Progress Notes (Signed)
Pre visit review using our clinic review tool, if applicable. No additional management support is needed unless otherwise documented below in the visit note. 

## 2013-05-19 NOTE — Addendum Note (Signed)
Addended by: Aniceto Boss A on: 05/19/2013 12:28 PM   Modules accepted: Orders

## 2013-05-19 NOTE — Progress Notes (Signed)
  Subjective:    Patient ID: JOHNATHON MITTAL, female    DOB: Nov 12, 1945, 67 y.o.   MRN: [id number]  HPI Here for one week of PND, chest tightness and coughing up green sputum.    Review of Systems  Constitutional: Negative.   HENT: Positive for congestion and postnasal drip.   Respiratory: Positive for chest tightness and shortness of breath.        Objective:   Physical Exam  Constitutional: She appears well-developed and well-nourished.  Hoarse   HENT:  Right Ear: External ear normal.  Left Ear: External ear normal.  Nose: Nose normal.  Mouth/Throat: Oropharynx is clear and moist.  Eyes: Conjunctivae are normal.  Pulmonary/Chest: Effort normal. No respiratory distress. She has no wheezes. She has no rales.  Scattered rhonchi   Lymphadenopathy:    She has no cervical adenopathy.          Assessment & Plan:  Drink fluids

## 2013-06-14 ENCOUNTER — Other Ambulatory Visit: Payer: Self-pay | Admitting: Cardiovascular Disease

## 2013-07-13 ENCOUNTER — Other Ambulatory Visit: Payer: Self-pay | Admitting: Cardiology

## 2013-08-02 ENCOUNTER — Other Ambulatory Visit: Payer: Self-pay | Admitting: Cardiovascular Disease

## 2013-08-06 DIAGNOSIS — H402290 Chronic angle-closure glaucoma, unspecified eye, stage unspecified: Secondary | ICD-10-CM | POA: Diagnosis not present

## 2013-08-13 ENCOUNTER — Ambulatory Visit (INDEPENDENT_AMBULATORY_CARE_PROVIDER_SITE_OTHER): Payer: Medicare Other

## 2013-08-13 ENCOUNTER — Encounter: Payer: Self-pay | Admitting: Podiatry

## 2013-08-13 ENCOUNTER — Ambulatory Visit (INDEPENDENT_AMBULATORY_CARE_PROVIDER_SITE_OTHER): Payer: Medicare Other | Admitting: Podiatry

## 2013-08-13 VITALS — BP 186/78 | HR 58 | Resp 16

## 2013-08-13 DIAGNOSIS — M722 Plantar fascial fibromatosis: Secondary | ICD-10-CM | POA: Diagnosis not present

## 2013-08-13 DIAGNOSIS — M7750 Other enthesopathy of unspecified foot: Secondary | ICD-10-CM

## 2013-08-13 MED ORDER — TRIAMCINOLONE ACETONIDE 10 MG/ML IJ SUSP
10.0000 mg | Freq: Once | INTRAMUSCULAR | Status: AC
  Administered 2013-08-13: 10 mg

## 2013-08-13 NOTE — Progress Notes (Signed)
Subjective:     Patient ID: [name], female   DOB: March 12, 1946, 68 y.o.   MRN: [id number]  HPI patient states that the outside of my right foot has become very tender and at times I get discomfort on the bottom of the heel. States it's been present for several months and is gradually gotten worse   Review of Systems     Objective:   Physical Exam Neurovascular status intact with no health history changes and muscle strength adequate. I noted discomfort on the outside of the right foot at the base of fifth metatarsal with inflammation and mild discomfort on the plantar heel    Assessment:     Peroneal brevis tendinitis right with mild plantar fasciitis right    Plan:     X-rays and H&P reviewed with patient and careful injection of lateral side foot 3 mg Kenalog 5 mg Xylocaine Marcaine mixture administered with reduced activity and ice therapy to be placed to the area

## 2013-08-13 NOTE — Patient Instructions (Signed)

## 2013-08-16 ENCOUNTER — Other Ambulatory Visit: Payer: Self-pay | Admitting: Cardiovascular Disease

## 2013-08-20 ENCOUNTER — Encounter: Payer: Self-pay | Admitting: Podiatry

## 2013-08-20 ENCOUNTER — Ambulatory Visit (INDEPENDENT_AMBULATORY_CARE_PROVIDER_SITE_OTHER): Payer: Medicare Other | Admitting: Podiatry

## 2013-08-20 VITALS — BP 160/67 | HR 75 | Resp 16

## 2013-08-20 DIAGNOSIS — M79609 Pain in unspecified limb: Secondary | ICD-10-CM

## 2013-08-20 DIAGNOSIS — M722 Plantar fascial fibromatosis: Secondary | ICD-10-CM | POA: Diagnosis not present

## 2013-08-20 DIAGNOSIS — B351 Tinea unguium: Secondary | ICD-10-CM

## 2013-08-20 NOTE — Progress Notes (Signed)
Subjective:     Patient ID: [name], female   DOB: 05-Jul-1946, 68 y.o.   MRN: [id number]  HPI patient states the outside is doing pretty good and the heel remains tender but it is more mild in its intensity. Also complains about her nailbeds being thick and she cannot cut them herself and they become painful   Review of Systems     Objective:   Physical Exam Neurovascular status unchanged with thick nail disease 1-5 both feet which become painful and pain in the right plantar heel of a mild to moderate nature    Assessment:     Plantar fasciitis right heel and mycotic nail infections 1-5 both feet with pain    Plan:     Reviewed plantar fasciitis shoe gear modifications physical therapy and consideration for injection in the future. Debridement nailbeds 1-5 both feet with no iatrogenic bleeding noted

## 2013-08-20 NOTE — Patient Instructions (Signed)
Diabetes and Foot Care Diabetes may cause you to have problems because of poor blood supply (circulation) to your feet and legs. This may cause the skin on your feet to become thinner, break easier, and heal more slowly. Your skin may become dry, and the skin may peel and crack. You may also have nerve damage in your legs and feet causing decreased feeling in them. You may not notice minor injuries to your feet that could lead to infections or more serious problems. Taking care of your feet is one of the most important things you can do for yourself.  HOME CARE INSTRUCTIONS  Wear shoes at all times, even in the house. Do not go barefoot. Bare feet are easily injured.  Check your feet daily for blisters, cuts, and redness. If you cannot see the bottom of your feet, use a mirror or ask someone for help.  Wash your feet with warm water (do not use hot water) and mild soap. Then pat your feet and the areas between your toes until they are completely dry. Do not soak your feet as this can dry your skin.  Apply a moisturizing lotion or petroleum jelly (that does not contain alcohol and is unscented) to the skin on your feet and to dry, brittle toenails. Do not apply lotion between your toes.  Trim your toenails straight across. Do not dig under them or around the cuticle. File the edges of your nails with an emery board or nail file.  Do not cut corns or calluses or try to remove them with medicine.  Wear clean socks or stockings every day. Make sure they are not too tight. Do not wear knee-high stockings since they may decrease blood flow to your legs.  Wear shoes that fit properly and have enough cushioning. To break in new shoes, wear them for just a few hours a day. This prevents you from injuring your feet. Always look in your shoes before you put them on to be sure there are no objects inside.  Do not cross your legs. This may decrease the blood flow to your feet.  If you find a minor scrape,  cut, or break in the skin on your feet, keep it and the skin around it clean and dry. These areas may be cleansed with mild soap and water. Do not cleanse the area with peroxide, alcohol, or iodine.  When you remove an adhesive bandage, be sure not to damage the skin around it.  If you have a wound, look at it several times a day to make sure it is healing.  Do not use heating pads or hot water bottles. They may burn your skin. If you have lost feeling in your feet or legs, you may not know it is happening until it is too late.  Make sure your health care provider performs a complete foot exam at least annually or more often if you have foot problems. Report any cuts, sores, or bruises to your health care provider immediately. SEEK MEDICAL CARE IF:   You have an injury that is not healing.  You have cuts or breaks in the skin.  You have an ingrown nail.  You notice redness on your legs or feet.  You feel burning or tingling in your legs or feet.  You have pain or cramps in your legs and feet.  Your legs or feet are numb.  Your feet always feel cold. SEEK IMMEDIATE MEDICAL CARE IF:   There is increasing redness,   swelling, or pain in or around a wound.  There is a red line that goes up your leg.  Pus is coming from a wound.  You develop a fever or as directed by your health care provider.  You notice a bad smell coming from an ulcer or wound. Document Released: 06/22/2000 Document Revised: 02/25/2013 Document Reviewed: 12/02/2012 [employer] Patient Information 2014 [employer].  

## 2013-08-28 ENCOUNTER — Other Ambulatory Visit: Payer: Self-pay | Admitting: Vascular Surgery

## 2013-08-28 DIAGNOSIS — I6529 Occlusion and stenosis of unspecified carotid artery: Secondary | ICD-10-CM

## 2013-09-21 ENCOUNTER — Encounter: Payer: Self-pay | Admitting: Family Medicine

## 2013-09-21 ENCOUNTER — Ambulatory Visit (INDEPENDENT_AMBULATORY_CARE_PROVIDER_SITE_OTHER): Payer: Medicare Other | Admitting: Family Medicine

## 2013-09-21 VITALS — BP 170/90 | HR 68 | Temp 99.3°F | Ht 65.0 in | Wt 198.0 lb

## 2013-09-21 DIAGNOSIS — IMO0001 Reserved for inherently not codable concepts without codable children: Secondary | ICD-10-CM

## 2013-09-21 DIAGNOSIS — M545 Low back pain, unspecified: Secondary | ICD-10-CM | POA: Diagnosis not present

## 2013-09-21 DIAGNOSIS — F3289 Other specified depressive episodes: Secondary | ICD-10-CM | POA: Diagnosis not present

## 2013-09-21 DIAGNOSIS — F329 Major depressive disorder, single episode, unspecified: Secondary | ICD-10-CM | POA: Diagnosis not present

## 2013-09-21 DIAGNOSIS — M797 Fibromyalgia: Secondary | ICD-10-CM | POA: Insufficient documentation

## 2013-09-21 MED ORDER — HYDROCODONE-ACETAMINOPHEN 5-325 MG PO TABS: 1.0000 | ORAL_TABLET | Freq: Four times a day (QID) | ORAL | Status: DC | PRN

## 2013-09-21 NOTE — Progress Notes (Signed)
Pre visit review using our clinic review tool, if applicable. No additional management support is needed unless otherwise documented below in the visit note. 

## 2013-09-21 NOTE — Progress Notes (Signed)
   Subjective:    Patient ID: [name], female    DOB: 29-Oct-1945, 68 y.o.   MRN: [id number]  HPI Here asking for my help. 3 weeks ago her mother fell and broke a hip, and after she was sent home she is getting PT 2 days a week. She requires assistance with all ADLs now including preparing food, bathing, dressing etc. So far [name], her brother and her sister have been taking care of her by spending a week at a time living with their mother 24 hours a day. This situation has been very stressful for [name] and it has caused her fibromyalgia to flare up. She has a lot of pain all over her body. She feels that she cannot take on the responsibility of taking care of her mother 100% for a week at a time. She thinks her brother and sister should handle this. [name] is willing to help provide food but she cannot live there or help with ADLs. She asks me to write note to this effect to show to her brother and sister.    Review of Systems  Constitutional: Negative.   Musculoskeletal: Positive for back pain, myalgias and neck pain.  Psychiatric/Behavioral: Positive for dysphoric mood. The patient is nervous/anxious.        Objective:   Physical Exam  Constitutional: She appears well-developed and well-nourished.  Psychiatric: Her behavior is normal. Thought content normal.  Anxious           Assessment & Plan:  I did write such a note for [name] to show her family members. Gave her some Vicodin to use prn.

## 2013-09-23 ENCOUNTER — Ambulatory Visit: Payer: Medicare Other | Admitting: Family Medicine

## 2013-10-01 DIAGNOSIS — E559 Vitamin D deficiency, unspecified: Secondary | ICD-10-CM | POA: Diagnosis not present

## 2013-10-01 DIAGNOSIS — D126 Benign neoplasm of colon, unspecified: Secondary | ICD-10-CM | POA: Diagnosis not present

## 2013-10-01 DIAGNOSIS — D869 Sarcoidosis, unspecified: Secondary | ICD-10-CM | POA: Diagnosis not present

## 2013-10-01 DIAGNOSIS — I2510 Atherosclerotic heart disease of native coronary artery without angina pectoris: Secondary | ICD-10-CM | POA: Diagnosis not present

## 2013-10-01 DIAGNOSIS — I10 Essential (primary) hypertension: Secondary | ICD-10-CM | POA: Diagnosis not present

## 2013-10-01 DIAGNOSIS — I6529 Occlusion and stenosis of unspecified carotid artery: Secondary | ICD-10-CM | POA: Diagnosis not present

## 2013-10-01 DIAGNOSIS — E118 Type 2 diabetes mellitus with unspecified complications: Secondary | ICD-10-CM | POA: Diagnosis not present

## 2013-10-01 DIAGNOSIS — E785 Hyperlipidemia, unspecified: Secondary | ICD-10-CM | POA: Diagnosis not present

## 2013-10-12 ENCOUNTER — Telehealth: Payer: Self-pay | Admitting: Family Medicine

## 2013-10-12 NOTE — Telephone Encounter (Signed)
I spoke with pt and she is going to try and schedule with another provider to check breast, and hopefully they can order the test. She does not want to wait until next week when Dr. Sarajane Jews gets back in the office.

## 2013-10-12 NOTE — Telephone Encounter (Signed)
Pt is calling needing an order for a diagnostic mammogram evaluation, at [facility] on [address], pt states she has an appointment scheduled for 9:30 this morning, however pt is requesting a call back to let her know if it can be done today. Pt is aware that dr. Sarajane Jews is out.

## 2013-11-03 ENCOUNTER — Other Ambulatory Visit: Payer: Self-pay | Admitting: Cardiovascular Disease

## 2013-11-16 ENCOUNTER — Encounter: Payer: Self-pay | Admitting: Family Medicine

## 2013-11-16 ENCOUNTER — Ambulatory Visit (INDEPENDENT_AMBULATORY_CARE_PROVIDER_SITE_OTHER): Payer: Medicare Other | Admitting: Family Medicine

## 2013-11-16 ENCOUNTER — Ambulatory Visit: Payer: Medicare Other | Admitting: Podiatry

## 2013-11-16 VITALS — BP 160/62 | HR 86 | Temp 98.6°F | Ht 65.0 in | Wt 198.0 lb

## 2013-11-16 DIAGNOSIS — I6529 Occlusion and stenosis of unspecified carotid artery: Secondary | ICD-10-CM

## 2013-11-16 DIAGNOSIS — N63 Unspecified lump in unspecified breast: Secondary | ICD-10-CM | POA: Diagnosis not present

## 2013-11-16 NOTE — Progress Notes (Signed)
   Subjective:    Patient ID: [name], female    DOB: Dec 16, 1945, 68 y.o.   MRN: [id number]  HPI Here asking [place] to check a possible lump in the left breast. She accidentally discovered this one month ago when she put her hand in her bra to get some money out (she stores it there sometimes). There is no pain or nipple DC. Her last mammogram was unremarkable in October 2013 except for some chronic right breast microcalcifications.    Review of Systems  Constitutional: Negative.   Respiratory: Negative.   Cardiovascular: Negative.        Objective:   Physical Exam  Constitutional: She appears well-developed and well-nourished.  Genitourinary:  The right breast tissue feels dense and is slightly tender, no lumps found. The left breast feels dense also but there is a well defined tender lump about 2 cm in diameter at the 12 o'clock position. Both axillae are clear           Assessment & Plan:  We will set up a bilateral mammogram and a left breast US soon.

## 2013-11-16 NOTE — Progress Notes (Signed)
Pre visit review using our clinic review tool, if applicable. No additional management support is needed unless otherwise documented below in the visit note. 

## 2013-11-17 ENCOUNTER — Ambulatory Visit: Payer: Self-pay | Admitting: Family Medicine

## 2013-11-18 DIAGNOSIS — N6459 Other signs and symptoms in breast: Secondary | ICD-10-CM | POA: Diagnosis not present

## 2013-11-19 ENCOUNTER — Ambulatory Visit: Payer: Medicare Other | Admitting: Podiatry

## 2013-12-08 ENCOUNTER — Other Ambulatory Visit: Payer: Self-pay | Admitting: Cardiovascular Disease

## 2013-12-09 ENCOUNTER — Encounter: Payer: Self-pay | Admitting: Family Medicine

## 2013-12-17 DIAGNOSIS — M545 Low back pain, unspecified: Secondary | ICD-10-CM | POA: Diagnosis not present

## 2013-12-17 DIAGNOSIS — M25559 Pain in unspecified hip: Secondary | ICD-10-CM | POA: Diagnosis not present

## 2013-12-22 ENCOUNTER — Encounter: Payer: Self-pay | Admitting: Gynecology

## 2013-12-22 ENCOUNTER — Ambulatory Visit (INDEPENDENT_AMBULATORY_CARE_PROVIDER_SITE_OTHER): Payer: Medicare Other | Admitting: Gynecology

## 2013-12-22 ENCOUNTER — Telehealth: Payer: Self-pay | Admitting: *Deleted

## 2013-12-22 VITALS — BP 124/76 | Ht 64.0 in | Wt 197.0 lb

## 2013-12-22 DIAGNOSIS — N952 Postmenopausal atrophic vaginitis: Secondary | ICD-10-CM

## 2013-12-22 DIAGNOSIS — N907 Vulvar cyst: Secondary | ICD-10-CM

## 2013-12-22 DIAGNOSIS — N6320 Unspecified lump in the left breast, unspecified quadrant: Secondary | ICD-10-CM

## 2013-12-22 DIAGNOSIS — N63 Unspecified lump in unspecified breast: Secondary | ICD-10-CM

## 2013-12-22 DIAGNOSIS — N9089 Other specified noninflammatory disorders of vulva and perineum: Secondary | ICD-10-CM | POA: Diagnosis not present

## 2013-12-22 DIAGNOSIS — I6529 Occlusion and stenosis of unspecified carotid artery: Secondary | ICD-10-CM

## 2013-12-22 NOTE — Telephone Encounter (Signed)
Appointment on 01/07/14 @ 9:30 am with Dr.Ingram, pt informed.

## 2013-12-22 NOTE — Progress Notes (Signed)
[name] [birth year] [id number]        68 y.o.  G2P1011 for followup exam. Several issues noted below.  Past medical history,surgical history, problem list, medications, allergies, family history and social history were all reviewed and documented as reviewed in the EPIC chart.  ROS:  12 system ROS performed with pertinent positives and negatives included in the history, assessment and plan.  Included Systems: General, HEENT, Neck, Cardiovascular, Pulmonary, Gastrointestinal, Genitourinary, Musculoskeletal, Dermatologic, Endocrine, Hematological, Neurologic, Psychiatric Additional significant findings :  None   Exam: Kim assistant Filed Vitals:   12/22/13 1036  BP: 124/76  Height: 5\' 4"  (1.626 m)  Weight: 197 lb (89.359 kg)   General appearance:  Normal affect, orientation and appearance. Skin: Grossly normal HEENT: Without gross lesions.  No cervical or supraclavicular adenopathy. Thyroid normal.  Lungs:  Clear without wheezing, rales or rhonchi Cardiac: RR, without RMG Abdominal:  Soft, nontender, without masses, guarding, rebound, organomegaly or hernia Breasts:  Examined lying and sitting. Right without masses, retractions, discharge or axillary adenopathy. Left with firm 3 cm mass 12:00 position 2 finger breaths above the areola. No overlying skin changes nipple discharge or axillary adenopathy. Physical Exam  Pulmonary/Chest:    Genitourinary:       Pelvic:  Ext/BUS/vagina with generalized atrophic changes. 2 classic sebaceous cysts on either lower labia majora.  Adnexa  Without masses or tenderness    Anus and perineum  Normal   Rectovaginal  Normal sphincter tone without palpated masses or tenderness.    Assessment/Plan:  68 y.o. G74P1011 female for followup exam.   1. Left breast mass. Present for several months. Had mammogram/ultrasound ordered by Dr. Sarajane Jews in May at [facility] that was normal. She clearly has a palpable mass I think this needs to be addressed and  excised despite the negative imaging. Will arrange appointment with general surgeon and she agrees with this and knows the importance of followup. 2. Sebaceous cysts bilateral labia majora. Present for years. Classic in appearance. Patient notes that she does not like having them there I offered an excision appointment. Patient is going to think about this and followup if she decides to do this otherwise we'll continue to follow him expectantly. 3. Postmenopausal/atrophic genital changes. Status post TAH USO for leiomyoma. Unclear as to which side, in fact unclear if both ovaries were not removed.. Patient asymptomatic without significant hot flushes, night sweats, vaginal dryness or dyspareunia. Will continue to monitor. 4. DEXA 2013 normal. Repeat at age 16. Increase calcium and vitamin D reviewed. 5. Pap smear 2011. No Pap smear done today. No history of abnormal Pap smears previously. Status post hysterectomy for benign indications. Over the age of 66. Options to stop screening altogether or less frequent screening intervals reviewed. Patient comfortable will stop screening. 6. Colonoscopy 2012. Repeat at their recommended interval. 7. History of HSV 2 culture positive 2007. No recurrences since then. Continue to monitor and report any recurrence. 8. Health maintenance. No blood work done as this is done through her primary physician's office. Followup for Gen. surgery appointment, vulvar cyst excisions if she chooses, otherwise annual exam in one year.   Note: This document was prepared with digital dictation and possible smart phrase technology. Any transcriptional errors that result from this process are unintentional.   [name], 11:11 AM 12/22/2013

## 2013-12-22 NOTE — Patient Instructions (Signed)
Office will call you to arrange appointment with the general surgeon.  Followup in one year for annual exam, sooner if any issues.

## 2013-12-22 NOTE — Telephone Encounter (Signed)
Message copied by Thamas Jaegers on Tue Dec 22, 2013  2:52 PM ------      Message from: [name]      Created: Tue Dec 22, 2013 11:19 AM       Patient needs appointment with general surgeon in reference to new left breast mass. ------

## 2013-12-24 DIAGNOSIS — E1159 Type 2 diabetes mellitus with other circulatory complications: Secondary | ICD-10-CM | POA: Diagnosis not present

## 2013-12-24 DIAGNOSIS — F329 Major depressive disorder, single episode, unspecified: Secondary | ICD-10-CM | POA: Diagnosis not present

## 2013-12-24 DIAGNOSIS — E785 Hyperlipidemia, unspecified: Secondary | ICD-10-CM | POA: Diagnosis not present

## 2013-12-24 DIAGNOSIS — E1165 Type 2 diabetes mellitus with hyperglycemia: Secondary | ICD-10-CM | POA: Diagnosis not present

## 2013-12-24 DIAGNOSIS — IMO0002 Reserved for concepts with insufficient information to code with codable children: Secondary | ICD-10-CM | POA: Diagnosis not present

## 2013-12-24 DIAGNOSIS — I6529 Occlusion and stenosis of unspecified carotid artery: Secondary | ICD-10-CM | POA: Diagnosis not present

## 2013-12-24 DIAGNOSIS — E559 Vitamin D deficiency, unspecified: Secondary | ICD-10-CM | POA: Diagnosis not present

## 2013-12-24 DIAGNOSIS — I10 Essential (primary) hypertension: Secondary | ICD-10-CM | POA: Diagnosis not present

## 2013-12-24 DIAGNOSIS — I6350 Cerebral infarction due to unspecified occlusion or stenosis of unspecified cerebral artery: Secondary | ICD-10-CM | POA: Diagnosis not present

## 2013-12-28 ENCOUNTER — Ambulatory Visit (INDEPENDENT_AMBULATORY_CARE_PROVIDER_SITE_OTHER): Payer: Medicare Other | Admitting: Family Medicine

## 2013-12-28 ENCOUNTER — Telehealth: Payer: Self-pay | Admitting: Family Medicine

## 2013-12-28 ENCOUNTER — Encounter: Payer: Self-pay | Admitting: Family Medicine

## 2013-12-28 VITALS — BP 161/61 | HR 72 | Temp 99.2°F | Ht 64.0 in | Wt 199.0 lb

## 2013-12-28 DIAGNOSIS — J310 Chronic rhinitis: Secondary | ICD-10-CM

## 2013-12-28 DIAGNOSIS — I6529 Occlusion and stenosis of unspecified carotid artery: Secondary | ICD-10-CM | POA: Diagnosis not present

## 2013-12-28 DIAGNOSIS — E119 Type 2 diabetes mellitus without complications: Secondary | ICD-10-CM

## 2013-12-28 DIAGNOSIS — J329 Chronic sinusitis, unspecified: Secondary | ICD-10-CM | POA: Diagnosis not present

## 2013-12-28 MED ORDER — HYDROCODONE-HOMATROPINE 5-1.5 MG/5ML PO SYRP: 5.0000 mL | ORAL_SOLUTION | Freq: Four times a day (QID) | ORAL | Status: AC | PRN

## 2013-12-28 MED ORDER — AZITHROMYCIN 250 MG PO TABS: ORAL_TABLET | ORAL | Status: DC

## 2013-12-28 NOTE — Telephone Encounter (Signed)
Pt is on schedule for today to see Dr. Maudie Mercury.

## 2013-12-28 NOTE — Patient Instructions (Signed)

## 2013-12-28 NOTE — Progress Notes (Signed)
Pre visit review using our clinic review tool, if applicable. No additional management support is needed unless otherwise documented below in the visit note. 

## 2013-12-28 NOTE — Telephone Encounter (Signed)
Patient Information:  Caller Name: Adrien  Phone: [phone]  Patient: [name]  Gender: Female  DOB: Oct 13, 1945  Age: 68 Years  PCP: Alysia Penna [facility])  Office Follow Up:  Does the office need to follow up with this patient?: No  Instructions For The Office: N/A  RN Note:  Declined triage.  Audible respiratory rate 20 rpm.  FBS 150 at 0900.  Reports is ambulatory- "I can get there." Denies life threatening problems.  Has not used Albuterol for these symptoms. Asking for appointment for shot "he always gives me."  Symptoms  Reason For Call & Symptoms: Called for appointment for cough, rhinorrhea and nasal congestion.  Mouth breathing.    Reviewed Health History In EMR: No  Reviewed Medications In EMR: No  Reviewed Allergies In EMR: Yes  Reviewed Surgeries / Procedures: Yes  Date of Onset of Symptoms: 12/25/2013  Treatments Tried: Steam treatment.  Treatments Tried Worked: No  Guideline(s) Used:  No Protocol Available - Sick Adult  Disposition Per Guideline:   Go to Office Now  Reason For Disposition Reached:   Nursing judgment  Advice Given:  Call Back If:  New symptoms develop  You become worse.  Patient Will Follow Care Advice:  YES  Appointment Scheduled:  12/28/2013 16:15:00 Appointment Scheduled Provider:  Maudie Mercury (TEXT 1st, after 20 mins can call), [facility])

## 2013-12-28 NOTE — Progress Notes (Signed)
No chief complaint on file.   HPI:  Upper resp infections: -started: 2-3 days ago -symptoms:nasal congestion, sore throat, cough, with thick white mucus from nose  -denies:fever, SOB, NVD, tooth pain -has tried: nothing -sick contacts/travel/risks: denies flu exposure, tick exposure or or Ebola risks -Hx of: reports PCP always treats this with zpack  DM: -almost out of her Novolog   ROS: See pertinent positives and negatives per HPI.  Past Medical History  Diagnosis Date  . CAD (coronary artery disease)     sees. Dr. Burt Knack. s/p cath with PCI of RCA (xience des) June 2010. Pt also with LAD and D2 dzs-NL FFR in both areas med Rx  . HTN (hypertension)   . Hyperlipidemia   . TIA (transient ischemic attack)     also Hx of it.   Marland Kitchen GERD (gastroesophageal reflux disease)   . Headache(784.0)   . Anemia, unspecified   . Edema   . Depressive disorder, not elsewhere classified   . Low back pain   . Obesity   . Duodenal nodule   . Hemorrhoids   . Arthritis   . Glaucoma     narrow angle, sees Dr. Herbert Deaner   . Type II or unspecified type diabetes mellitus without mention of complication, not stated as uncontrolled     sees Dr. Carrolyn Meiers   . Gastric polyps     COLON  . Allergy     Past Surgical History  Procedure Laterality Date  . Back surgery    . Bilateral carpal tunnel release    . Colonoscopy  05-29-11    per Dr. Henrene Pastor, benign polyps, repeat in 5 yrs    . Esophagogastroduodenoscopy  02-08-06    per Dr. Henrene Pastor, gastritis   . Glaucoma surgery      with laser, per Dr. Henrene Pastor   . Carotid endarterectomy  march 2012    per Dr. Ruta Hinds  . Abdominal hysterectomy  age 45    TAH and USO  Leiomyomata    Family History  Problem Relation Age of Onset  . Coronary artery disease Neg Hx     premature CAD  . Colon cancer Neg Hx   . Esophageal cancer Neg Hx   . Stomach cancer Neg Hx   . Hypertension Mother   . Hypertension Sister   . Hypertension Brother   . Cancer  Brother     PROSTATE/LUNG  . Cancer Father     Prostate and pancreatic     History   Social History  . Marital Status: Married    Spouse Name: N/A    Number of Children: N/A  . Years of Education: N/A   Social History Main Topics  . Smoking status: Former Research scientist (life sciences)  . Smokeless tobacco: Never Used     Comment: non smoker  . Alcohol Use: No  . Drug Use: No  . Sexual Activity: No     Comment: HYST   Other Topics Concern  . None   Social History Narrative  . None    Current outpatient prescriptions:aspirin 81 MG tablet, Take 81 mg by mouth daily. , Disp: , Rfl: ;  CRESTOR 40 MG tablet, TAKE ONE TABLET BY MOUTH EVERY DAY, Disp: 30 tablet, Rfl: 3;  diclofenac sodium (VOLTAREN) 1 % GEL, Apply 1 application topically as needed.  , Disp: , Rfl: ;  ergocalciferol (VITAMIN D2) 50000 UNITS capsule, Take 50,000 Units by mouth once a week. , Disp: , Rfl:  furosemide (LASIX) 40 MG tablet,  TAKE ONE TABLET BY MOUTH EVERY DAY, Disp: 30 tablet, Rfl: 3;  glucose blood test strip, 1 each by Other route as needed. Use as instructed , Disp: , Rfl: ;  hydrALAZINE (APRESOLINE) 100 MG tablet, TAKE ONE TABLET BY MOUTH THREE TIMES DAILY, Disp: 90 tablet, Rfl: 0;  insulin aspart (NOVOLOG) 100 UNIT/ML injection, Inject 7 Units into the skin 3 (three) times daily before meals. , Disp: , Rfl:  insulin glargine (LANTUS) 100 UNIT/ML injection, Inject 27 Units into the skin daily. , Disp: , Rfl: ;  irbesartan (AVAPRO) 300 MG tablet, TAKE ONE TABLET BY MOUTH ONCE DAILY, Disp: 30 tablet, Rfl: 1;  isosorbide mononitrate (IMDUR) 60 MG 24 hr tablet, TAKE ONE TABLET BY MOUTH EVERY DAY, Disp: 30 tablet, Rfl: 6;  latanoprost (XALATAN) 0.005 % ophthalmic solution, Place 1 drop into both eyes daily. , Disp: , Rfl:  meloxicam (MOBIC) 15 MG tablet, Take 15 mg by mouth daily. , Disp: , Rfl: ;  metoprolol succinate (TOPROL-XL) 100 MG 24 hr tablet, TAKE ONE TABLET BY MOUTH EVERY DAY, Disp: 30 tablet, Rfl: 0;  pantoprazole (PROTONIX)  40 MG tablet, Take 1 tablet (40 mg total) by mouth daily., Disp: 30 tablet, Rfl: 5;  spironolactone (ALDACTONE) 25 MG tablet, Take 1 tablet (25 mg total) by mouth daily., Disp: 90 tablet, Rfl: 3 azithromycin (ZITHROMAX) 250 MG tablet, 2 tabs on day one then one tab daily, Disp: 6 tablet, Rfl: 0;  HYDROcodone-homatropine (HYCODAN) 5-1.5 MG/5ML syrup, Take 5 mLs by mouth every 6 (six) hours as needed for cough., Disp: 120 mL, Rfl: 0;  nitroGLYCERIN (NITROSTAT) 0.4 MG SL tablet, Place 0.4 mg under the tongue every 5 (five) minutes as needed.  , Disp: , Rfl:   EXAM:  Filed Vitals:   12/28/13 1637  BP: 161/61  Pulse: 72  Temp: 99.2 F (37.3 C)    Body mass index is 34.14 kg/(m^2).  GENERAL: vitals reviewed and listed above, alert, oriented, appears well hydrated and in no acute distress  HEENT: atraumatic, conjunttiva clear, no obvious abnormalities on inspection of external nose and ears, normal appearance of ear canals and TMs, clear nasal congestion, mild post oropharyngeal erythema with PND, no tonsillar edema or exudate, no sinus TTP  NECK: no obvious masses on inspection  LUNGS: clear to auscultation bilaterally, no wheezes, rales or rhonchi, good air movement  CV: HRRR, no peripheral edema  MS: moves all extremities without noticeable abnormality  PSYCH: pleasant and cooperative, no obvious depression or anxiety  ASSESSMENT AND PLAN:  Discussed the following assessment and plan:  Rhinosinusitis - Plan: HYDROcodone-homatropine (HYCODAN) 5-1.5 MG/5ML syrup, azithromycin (ZITHROMAX) 250 MG tablet  DIABETES MELLITUS, TYPE II  -given HPI and exam findings today, a serious infection or illness is unlikely. We discussed potential etiologies, with VURI being most likely, and advised supportive care and monitoring. We discussed treatment side effects, likely course, antibiotic misuse, transmission, and signs of developing a serious illness - she is concerned for sinusitis and given her  reports of thick white mucus and report PCP always rxs azithromycin will rx incase worsening or not improving in next 2 days. Afrin in meantime. Sample of novolog and follow up with PCP for further refills. -of course, we advised to return or notify a doctor immediately if symptoms worsen or persist or new concerns arise.    Patient Instructions  -As we discussed, we have prescribed a new medication for you at this appointment. We discussed the common and serious potential adverse effects of  this medication and you can review these and more with the pharmacist when you pick up your medication.  Please follow the instructions for use carefully and notify us immediately if you have any problems taking this medication.       Colin Benton R.

## 2013-12-31 DIAGNOSIS — Z6832 Body mass index (BMI) 32.0-32.9, adult: Secondary | ICD-10-CM | POA: Diagnosis not present

## 2013-12-31 DIAGNOSIS — M545 Low back pain, unspecified: Secondary | ICD-10-CM | POA: Diagnosis not present

## 2013-12-31 DIAGNOSIS — I10 Essential (primary) hypertension: Secondary | ICD-10-CM | POA: Diagnosis not present

## 2013-12-31 DIAGNOSIS — M25559 Pain in unspecified hip: Secondary | ICD-10-CM | POA: Diagnosis not present

## 2014-01-04 ENCOUNTER — Other Ambulatory Visit: Payer: Self-pay | Admitting: Neurosurgery

## 2014-01-04 DIAGNOSIS — M545 Low back pain, unspecified: Secondary | ICD-10-CM

## 2014-01-07 ENCOUNTER — Encounter (INDEPENDENT_AMBULATORY_CARE_PROVIDER_SITE_OTHER): Payer: Self-pay | Admitting: General Surgery

## 2014-01-07 ENCOUNTER — Other Ambulatory Visit: Payer: Self-pay | Admitting: Cardiovascular Disease

## 2014-01-07 ENCOUNTER — Encounter (INDEPENDENT_AMBULATORY_CARE_PROVIDER_SITE_OTHER): Payer: Self-pay

## 2014-01-07 ENCOUNTER — Ambulatory Visit (INDEPENDENT_AMBULATORY_CARE_PROVIDER_SITE_OTHER): Payer: Medicare Other | Admitting: General Surgery

## 2014-01-07 VITALS — BP 140/60 | HR 72 | Resp 16 | Ht 64.0 in | Wt 196.0 lb

## 2014-01-07 DIAGNOSIS — N63 Unspecified lump in unspecified breast: Secondary | ICD-10-CM | POA: Diagnosis not present

## 2014-01-07 DIAGNOSIS — N6320 Unspecified lump in the left breast, unspecified quadrant: Secondary | ICD-10-CM

## 2014-01-07 NOTE — Progress Notes (Signed)
Patient ID: [name], female   DOB: 1946-06-19, 68 y.o.   MRN: [id number]  Chief Complaint  Patient presents with  . breast mass    HPI [name] is a 68 y.o. female.  She is referred by Dr. Phineas Real for a palpable mass in the left breast. Dr. Gordan Payment is her PCP. Dr. Sherren Mocha is her cardiologist. Dr. Oneida Alar as her vascular surgeon.  The patient has noticed a palpable mass in the left rest superiorly for about 6 weeks. Denies pain or nipple discharge or trauma. Mammograms and ultrasound of [place] showed only benign fibroglandular tissue. She has a clip in the right breast that is also noted.  She had a image guided biopsy of a right breast. She is vague about the details but states this was benign.  Dr. Phineas Real referred her and recommended excision of this mass.  Her morbidities include TIA on aspirin. Right carotid endarterectomy. Hypertension. Fibromyalgia. Coronary artery disease, followed by Dr. Ezzie Dural every 6 months, asthma, insulin-dependent diabetes mellitus, GERD.  Family history reveals breast cancer aina cousin,  no ovarian cancer. Brother has lung cancer  HPI  Past Medical History  Diagnosis Date  . CAD (coronary artery disease)     sees. Dr. Burt Knack. s/p cath with PCI of RCA (xience des) June 2010. Pt also with LAD and D2 dzs-NL FFR in both areas med Rx  . HTN (hypertension)   . Hyperlipidemia   . TIA (transient ischemic attack)     also Hx of it.   Marland Kitchen GERD (gastroesophageal reflux disease)   . Headache(784.0)   . Anemia, unspecified   . Edema   . Depressive disorder, not elsewhere classified   . Low back pain   . Obesity   . Duodenal nodule   . Hemorrhoids   . Arthritis   . Glaucoma     narrow angle, sees Dr. Herbert Deaner   . Type II or unspecified type diabetes mellitus without mention of complication, not stated as uncontrolled     sees Dr. Carrolyn Meiers   . Gastric polyps     COLON  . Allergy     Past Surgical History  Procedure  Laterality Date  . Back surgery    . Bilateral carpal tunnel release    . Colonoscopy  05-29-11    per Dr. Henrene Pastor, benign polyps, repeat in 5 yrs    . Esophagogastroduodenoscopy  02-08-06    per Dr. Henrene Pastor, gastritis   . Glaucoma surgery      with laser, per Dr. Henrene Pastor   . Carotid endarterectomy  march 2012    per Dr. Ruta Hinds  . Abdominal hysterectomy  age 52    TAH and USO  Leiomyomata    Family History  Problem Relation Age of Onset  . Coronary artery disease Neg Hx     premature CAD  . Colon cancer Neg Hx   . Esophageal cancer Neg Hx   . Stomach cancer Neg Hx   . Hypertension Mother   . Hypertension Sister   . Hypertension Brother   . Cancer Brother     PROSTATE/LUNG  . Cancer Father     Prostate and pancreatic     Social History History  Substance Use Topics  . Smoking status: Former Research scientist (life sciences)  . Smokeless tobacco: Never Used     Comment: non smoker  . Alcohol Use: No    No Known Allergies  Current Outpatient Prescriptions  Medication Sig Dispense Refill  .  aspirin 81 MG tablet Take 81 mg by mouth daily.       Marland Kitchen azithromycin (ZITHROMAX) 250 MG tablet 2 tabs on day one then one tab daily  6 tablet  0  . CRESTOR 40 MG tablet TAKE ONE TABLET BY MOUTH EVERY DAY  30 tablet  3  . diclofenac sodium (VOLTAREN) 1 % GEL Apply 1 application topically as needed.        . DULoxetine (CYMBALTA) 30 MG capsule       . ergocalciferol (VITAMIN D2) 50000 UNITS capsule Take 50,000 Units by mouth once a week.       . furosemide (LASIX) 40 MG tablet TAKE ONE TABLET BY MOUTH EVERY DAY  30 tablet  3  . glucose blood test strip 1 each by Other route as needed. Use as instructed       . hydrALAZINE (APRESOLINE) 100 MG tablet TAKE ONE TABLET BY MOUTH THREE TIMES DAILY  90 tablet  0  . HYDROcodone-homatropine (HYCODAN) 5-1.5 MG/5ML syrup Take 5 mLs by mouth every 6 (six) hours as needed for cough.  120 mL  0  . insulin aspart (NOVOLOG) 100 UNIT/ML injection Inject 7 Units into the skin 3  (three) times daily before meals.       . insulin glargine (LANTUS) 100 UNIT/ML injection Inject 27 Units into the skin daily.       . irbesartan (AVAPRO) 300 MG tablet TAKE ONE TABLET BY MOUTH ONCE DAILY  30 tablet  1  . isosorbide mononitrate (IMDUR) 60 MG 24 hr tablet TAKE ONE TABLET BY MOUTH EVERY DAY  30 tablet  6  . latanoprost (XALATAN) 0.005 % ophthalmic solution Place 1 drop into both eyes daily.       . meloxicam (MOBIC) 15 MG tablet Take 15 mg by mouth daily.       . metoprolol succinate (TOPROL-XL) 100 MG 24 hr tablet TAKE ONE TABLET BY MOUTH EVERY DAY  30 tablet  0  . nitroGLYCERIN (NITROSTAT) 0.4 MG SL tablet Place 0.4 mg under the tongue every 5 (five) minutes as needed.        . pantoprazole (PROTONIX) 40 MG tablet Take 1 tablet (40 mg total) by mouth daily.  30 tablet  5  . spironolactone (ALDACTONE) 25 MG tablet Take 1 tablet (25 mg total) by mouth daily.  90 tablet  3   No current facility-administered medications for this visit.    Review of Systems Review of Systems  Constitutional: Negative for fever, chills and unexpected weight change.  HENT: Negative for congestion, hearing loss, sore throat, trouble swallowing and voice change.   Eyes: Negative for visual disturbance.  Respiratory: Negative for cough and wheezing.   Cardiovascular: Negative for chest pain, palpitations and leg swelling.  Gastrointestinal: Negative for nausea, vomiting, abdominal pain, diarrhea, constipation, blood in stool, abdominal distention and anal bleeding.  Genitourinary: Negative for hematuria, vaginal bleeding and difficulty urinating.  Musculoskeletal: Positive for back pain. Negative for arthralgias.  Skin: Negative for rash and wound.  Neurological: Negative for seizures, syncope and headaches.  Hematological: Negative for adenopathy. Does not bruise/bleed easily.  Psychiatric/Behavioral: Negative for confusion.    Blood pressure 140/60, pulse 72, resp. rate 16, height 5\' 4"  (1.626  m), weight 196 lb (88.905 kg).  Physical Exam Physical Exam  Constitutional: She is oriented to person, place, and time. She appears well-developed and well-nourished. No distress.  HENT:  Head: Normocephalic and atraumatic.  Nose: Nose normal.  Mouth/Throat: No oropharyngeal exudate.  Eyes: Conjunctivae and EOM are normal. Pupils are equal, round, and reactive to light. Left eye exhibits no discharge. No scleral icterus.  Neck: Neck supple. No JVD present. No tracheal deviation present. No thyromegaly present.  Right carotid endarterectomy scar well healed.  Cardiovascular: Normal rate, regular rhythm, normal heart sounds and intact distal pulses.   No murmur heard. Pulmonary/Chest: Effort normal and breath sounds normal. No respiratory distress. She has no wheezes. She has no rales. She exhibits no tenderness.    2.5 cm, firm, irregular, nontender mass left breast at 11:30 position. Overlying skin normal. No other mass in either breast. No nipple discharge. No axillary adenopathy.  Abdominal: Soft. Bowel sounds are normal. She exhibits no distension and no mass. There is no tenderness. There is no rebound and no guarding.  Pfannenstiel incision healed  Musculoskeletal: She exhibits no edema and no tenderness.  Lymphadenopathy:    She has no cervical adenopathy.  Neurological: She is alert and oriented to person, place, and time. She exhibits normal muscle tone. Coordination normal.  Skin: Skin is warm. No rash noted. She is not diaphoretic. No erythema. No pallor.  Psychiatric: She has a normal mood and affect. Her behavior is normal. Judgment and thought content normal.    Data Reviewed Imaging studies. Office notes from Dr. Phineas Real  Assessment    Irregular solid mass left breast, 11:30 position. Given the negative imaging studies this is more likely to be benign fibrosis or fat necrosis. However, because of its dominant nature and distinct margins, tissue diagnosis is indicated  to rule out cancer  Insulin-dependent diabetes  Coronary artery disease on aspirin  History TIA  History right carotid endarterectomy  Hypertension  Fibromyalgia  Asthma  GERD.     Plan    We discussed this for the better part of 30 minutes. The patient is strongly motivated to have this area excised to prove it is not cancer. This is reasonable.  She'll be scheduled for a left breast lumpectomy in the near future. I told we would be relatively conservative with this, and that if it did show cancer we will probably have to re excise.   She'll be scheduled for left breast lumpectomy in the near future.  I discussed the indications, , details, techniques, and numerous risk of the surgery with her. She is aware of the risk of bleeding, infection, skin necrosis, cosmetic deformity, reoperation if this turns out to be cancer. She understands all these issues. All of her questions are answered. She agrees with this plan.  We will ask Dr. Sherren Mocha for risk assessment and whether we can stop the aspirin about 4 days preop.       Edsel Petrin. Dalbert Batman, M.D., [facility] General and Minimally invasive Surgery Breast and Colorectal Surgery Office:   [phone] Pager:   [phone]  01/07/2014, 10:35 AM

## 2014-01-07 NOTE — Patient Instructions (Signed)
You have a distinct irregular mass in your left breast at the 11:00 position.  Your mammogram and ultrasound did not show a malignant mass.  While this is probably not cancer, it would be reasonable to conservatively excise this to prove that it is not cancer.  You had stated that he would like to have this area removed as soon as possible.  He will be scheduled for excision of this area in the operating room. Recall this operation and lumpectomy. We will remove as little tissue as possible.  We will check with Dr. Burt Knack to be sure we can stop your aspirin 4 days preop     Lumpectomy A lumpectomy is a form of "breast conserving" or "breast preservation" surgery. It may also be referred to as a partial mastectomy. During a lumpectomy, the portion of the breast that contains the cancerous tumor or breast mass (the lump) is removed. Some normal tissue around the lump may also be removed to make sure all the tumor has been removed. This surgery should take 40 minutes or less. LET [facility] CARE PROVIDER KNOW ABOUT:  Any allergies you have.  All medicines you are taking, including vitamins, herbs, eye drops, creams, and over-the-counter medicines.  Previous problems you or members of your family have had with the use of anesthetics.  Any blood disorders you have.  Previous surgeries you have had.  Medical conditions you have. RISKS AND COMPLICATIONS Generally, this is a safe procedure. However, as with any procedure, complications can occur. Possible complications include:  Bleeding.  Infection.  Pain.  Temporary swelling.  Change in the shape of the breast, particularly if a large portion is removed. BEFORE THE PROCEDURE  Ask your health care provider about changing or stopping your regular medicines.  Do not eat or drink anything for 7-8 hours before the surgery or as directed by your health care provider. Ask your health care provider if you can take a sip of water with  any approved medicines.  On the day of surgery, your healthcare provider will use a mammogram or ultrasound to locate and mark the tumor in your breast. These markings on your breast will show where the cut (incision) will be made. PROCEDURE   An IV tube will be put into one of your veins.  You may be given medicine to help you relax before the surgery (sedative). You will be given one of the following:  A medicine that numbs the area (local anesthesia).  A medicine that makes you go to sleep (general anesthesia).  Your health care provider will use a kind of electric scalpel that uses heat to minimize bleeding (electrocautery knife).  A curved incision (like a smile or frown) that follows the natural curve of your breast is made, to allow for minimal scarring and better healing.  The tumor will be removed with some of the surrounding tissue. This will be sent to the lab for analysis. Your health care provider may also remove your lymph nodes at this time if needed.  Sometimes, but not always, a rubber tube called a drain will be surgically inserted into your breast area or armpit to collect excess fluid that may accumulate in the space where the tumor was. This drain is connected to a plastic bulb on the outside of your body. This drain creates suction to help remove the fluid.  The incisions will be closed with stitches (sutures).  A bandage may be placed over the incisions. AFTER THE PROCEDURE  You  will be taken to the recovery area.  You will be given medicine for pain.  A small rubber drain may be placed in the breast for 2-3 days to prevent a collection of blood (hematoma) from developing in the breast. You will be given instructions on caring for the drain before you go home.  A pressure bandage (dressing) will be applied for 1-2 days to prevent bleeding. Ask your health care provider how to care for your bandage at home. Document Released: 08/06/2006 Document Revised:  02/25/2013 Document Reviewed: 11/28/2012 [facility] Patient Information 2015 [place], [place]. This information is not intended to replace advice given to you by your health care provider. Make sure you discuss any questions you have with your health care provider.

## 2014-01-15 ENCOUNTER — Encounter: Payer: Self-pay | Admitting: Cardiovascular Disease

## 2014-01-15 ENCOUNTER — Ambulatory Visit (INDEPENDENT_AMBULATORY_CARE_PROVIDER_SITE_OTHER): Payer: Medicare Other | Admitting: Cardiovascular Disease

## 2014-01-15 VITALS — BP 153/75 | HR 69 | Ht 64.0 in | Wt 195.4 lb

## 2014-01-15 DIAGNOSIS — Z01818 Encounter for other preprocedural examination: Secondary | ICD-10-CM | POA: Diagnosis not present

## 2014-01-15 DIAGNOSIS — I6529 Occlusion and stenosis of unspecified carotid artery: Secondary | ICD-10-CM

## 2014-01-15 MED ORDER — CLONIDINE HCL 0.1 MG PO TABS: 0.1000 mg | ORAL_TABLET | Freq: Two times a day (BID) | ORAL | Status: DC

## 2014-01-15 NOTE — Patient Instructions (Addendum)
Your physician has recommended you make the following change in your medication:   1. START CLONIDNIE 0.1MG  TWICE A DAY   2. Hold ASPIRIN 5 DAYS PRIOR TO SURGERY AND NOTE WILL BE SENT TO DR Dalbert Batman  Your physician wants you to follow-up in: [place] will receive a reminder letter in the mail two months in advance. If you don't receive a letter, please call our office to schedule the follow-up appointment.

## 2014-01-15 NOTE — Progress Notes (Signed)
HPI:  [name] returns for followup evaluation. She is a 68 year old woman with hypertension, diabetes, coronary artery disease, and history of TIA. She initially presented with unstable angina in 2010 underwent stenting of the right coronary artery and pressure wire analysis of the LAD/diagonal branches. She's had no recurrent cardiac event since that time. The patient has been followed for malignant hypertension which has been difficult to control despite multiple antihypertensive medications at maximal doses.  The patient has been diagnosed with a left breast mass and is tentatively scheduled for left breast lumpectomy by Dr. Dalbert Batman.  From a cardiac perspective, she has been stable. She rides a stationary bike approximately 2.5 miles on a regular basis. His takes her about 20 minutes. She also walks on occasion but is somewhat limited by low back problems. She denies chest pain, chest pressure, shortness of breath, or heart palpitations. She admits to mild leg swelling. No orthopnea or PND. No other complaints. She is compliant with her medications.  Outpatient Encounter Prescriptions as of 01/15/2014  Medication Sig  . aspirin 81 MG tablet Take 81 mg by mouth daily.   . CRESTOR 40 MG tablet TAKE ONE TABLET BY MOUTH ONCE DAILY  . diclofenac sodium (VOLTAREN) 1 % GEL Apply 1 application topically as needed.    . DULoxetine (CYMBALTA) 30 MG capsule   . ergocalciferol (VITAMIN D2) 50000 UNITS capsule Take 50,000 Units by mouth once a week.   . furosemide (LASIX) 40 MG tablet TAKE ONE TABLET BY MOUTH EVERY DAY  . glucose blood test strip 1 each by Other route as needed. Use as instructed   . hydrALAZINE (APRESOLINE) 100 MG tablet TAKE ONE TABLET BY MOUTH THREE TIMES DAILY  . insulin aspart (NOVOLOG) 100 UNIT/ML injection Inject 7 Units into the skin 3 (three) times daily before meals.   . insulin glargine (LANTUS) 100 UNIT/ML injection Inject 27 Units into the skin daily.   . irbesartan  (AVAPRO) 300 MG tablet TAKE ONE TABLET BY MOUTH ONCE DAILY  . isosorbide mononitrate (IMDUR) 60 MG 24 hr tablet TAKE ONE TABLET BY MOUTH EVERY DAY  . latanoprost (XALATAN) 0.005 % ophthalmic solution Place 1 drop into both eyes daily.   . meloxicam (MOBIC) 15 MG tablet Take 15 mg by mouth daily.   . metoprolol succinate (TOPROL-XL) 100 MG 24 hr tablet TAKE ONE TABLET BY MOUTH EVERY DAY  . montelukast (SINGULAIR) 10 MG tablet   . nitroGLYCERIN (NITROSTAT) 0.4 MG SL tablet Place 0.4 mg under the tongue every 5 (five) minutes as needed.    . pantoprazole (PROTONIX) 40 MG tablet Take 1 tablet (40 mg total) by mouth daily.  Marland Kitchen spironolactone (ALDACTONE) 25 MG tablet Take 1 tablet (25 mg total) by mouth daily.  . cloNIDine (CATAPRES) 0.1 MG tablet Take 1 tablet (0.1 mg total) by mouth 2 (two) times daily.  . [EXPIRED] HYDROcodone-homatropine (HYCODAN) 5-1.5 MG/5ML syrup Take 5 mLs by mouth every 6 (six) hours as needed for cough.  . [DISCONTINUED] azithromycin (ZITHROMAX) 250 MG tablet 2 tabs on day one then one tab daily  . [DISCONTINUED] CRESTOR 40 MG tablet TAKE ONE TABLET BY MOUTH EVERY DAY  . [DISCONTINUED] HYDROcodone-acetaminophen (NORCO/VICODIN) 5-325 MG per tablet     No Known Allergies  Past Medical History  Diagnosis Date  . CAD (coronary artery disease)     sees. Dr. Burt Knack. s/p cath with PCI of RCA (xience des) June 2010. Pt also with LAD and D2 dzs-NL FFR in both areas  med Rx  . HTN (hypertension)   . Hyperlipidemia   . TIA (transient ischemic attack)     also Hx of it.   Marland Kitchen GERD (gastroesophageal reflux disease)   . Headache(784.0)   . Anemia, unspecified   . Edema   . Depressive disorder, not elsewhere classified   . Low back pain   . Obesity   . Duodenal nodule   . Hemorrhoids   . Arthritis   . Glaucoma     narrow angle, sees Dr. Herbert Deaner   . Type II or unspecified type diabetes mellitus without mention of complication, not stated as uncontrolled     sees Dr. Carrolyn Meiers    . Gastric polyps     COLON  . Allergy     ROS: Negative except as per HPI  BP 153/75  Pulse 69  Ht 5\' 4"  (1.626 m)  Wt 88.633 kg (195 lb 6.4 oz)  BMI 33.52 kg/m2  PHYSICAL EXAM: Pt is alert and oriented, NAD HEENT: normal Neck: JVP - normal, carotids 2+= without bruits Lungs: CTA bilaterally CV: RRR without murmur or gallop Abd: soft, NT, Positive BS, no hepatomegaly Ext: no C/C/E, distal pulses intact and equal Skin: warm/dry no rash  EKG:  Normal sinus rhythm, within normal limits.  ASSESSMENT AND PLAN: 1. Coronary atherosclerosis, native vessel. The patient is stable without symptoms of angina. She can clearly achieve greater than 4 metabolic equivalents without symptoms. I think it is reasonable to proceed with surgery without further cardiac testing. She is at acceptable risk of holding aspirin for 5 days before her surgery. She should start back on daily aspirin when okay with Dr. Dalbert Batman following surgery.  2. Malignant hypertension with diabetes and history of TIA. She is on multiple medications and these were reviewed. She is intolerant to amlodipine because of swelling. Recommended a trial of clonidine 0.1 mg twice daily in addition to her other medicines. Side effect profile was reviewed with the patient.  3. Hyperlipidemia. Lipids reviewed and she remains on Crestor 40 mg daily.  For followup I will see her back in one year.  Sherren Mocha 01/15/2014 10:58 AM

## 2014-01-19 ENCOUNTER — Ambulatory Visit
Admission: RE | Admit: 2014-01-19 | Discharge: 2014-01-19 | Disposition: A | Discharge status: Home / Self Care | Payer: Medicare Other | Source: Ambulatory Visit | Attending: Neurosurgery | Admitting: Neurosurgery

## 2014-01-19 DIAGNOSIS — M545 Low back pain, unspecified: Secondary | ICD-10-CM

## 2014-01-19 DIAGNOSIS — M5126 Other intervertebral disc displacement, lumbar region: Secondary | ICD-10-CM | POA: Diagnosis not present

## 2014-01-19 DIAGNOSIS — M47817 Spondylosis without myelopathy or radiculopathy, lumbosacral region: Secondary | ICD-10-CM | POA: Diagnosis not present

## 2014-01-19 DIAGNOSIS — M48061 Spinal stenosis, lumbar region without neurogenic claudication: Secondary | ICD-10-CM | POA: Diagnosis not present

## 2014-01-19 MED ORDER — GADOBENATE DIMEGLUMINE 529 MG/ML IV SOLN
18.0000 mL | Freq: Once | INTRAVENOUS | Status: AC | PRN
  Administered 2014-01-19: 18 mL via INTRAVENOUS

## 2014-01-21 ENCOUNTER — Encounter (HOSPITAL_BASED_OUTPATIENT_CLINIC_OR_DEPARTMENT_OTHER): Payer: Self-pay | Admitting: *Deleted

## 2014-01-21 ENCOUNTER — Encounter (HOSPITAL_BASED_OUTPATIENT_CLINIC_OR_DEPARTMENT_OTHER)
Admission: RE | Admit: 2014-01-21 | Discharge: 2014-01-21 | Disposition: A | Discharge status: Home / Self Care | Payer: Medicare Other | Source: Ambulatory Visit | Attending: General Surgery | Admitting: General Surgery

## 2014-01-21 DIAGNOSIS — N63 Unspecified lump in unspecified breast: Secondary | ICD-10-CM | POA: Diagnosis present

## 2014-01-21 DIAGNOSIS — M129 Arthropathy, unspecified: Secondary | ICD-10-CM | POA: Diagnosis not present

## 2014-01-21 DIAGNOSIS — K219 Gastro-esophageal reflux disease without esophagitis: Secondary | ICD-10-CM | POA: Diagnosis not present

## 2014-01-21 DIAGNOSIS — Z7982 Long term (current) use of aspirin: Secondary | ICD-10-CM | POA: Diagnosis not present

## 2014-01-21 DIAGNOSIS — I2510 Atherosclerotic heart disease of native coronary artery without angina pectoris: Secondary | ICD-10-CM | POA: Diagnosis not present

## 2014-01-21 DIAGNOSIS — K649 Unspecified hemorrhoids: Secondary | ICD-10-CM | POA: Diagnosis not present

## 2014-01-21 DIAGNOSIS — H4020X0 Unspecified primary angle-closure glaucoma, stage unspecified: Secondary | ICD-10-CM | POA: Diagnosis not present

## 2014-01-21 DIAGNOSIS — E785 Hyperlipidemia, unspecified: Secondary | ICD-10-CM | POA: Diagnosis not present

## 2014-01-21 DIAGNOSIS — Z794 Long term (current) use of insulin: Secondary | ICD-10-CM | POA: Diagnosis not present

## 2014-01-21 DIAGNOSIS — E669 Obesity, unspecified: Secondary | ICD-10-CM | POA: Diagnosis not present

## 2014-01-21 DIAGNOSIS — Z79899 Other long term (current) drug therapy: Secondary | ICD-10-CM | POA: Diagnosis not present

## 2014-01-21 DIAGNOSIS — Z87891 Personal history of nicotine dependence: Secondary | ICD-10-CM | POA: Diagnosis not present

## 2014-01-21 DIAGNOSIS — Z791 Long term (current) use of non-steroidal anti-inflammatories (NSAID): Secondary | ICD-10-CM | POA: Diagnosis not present

## 2014-01-21 DIAGNOSIS — J45909 Unspecified asthma, uncomplicated: Secondary | ICD-10-CM | POA: Diagnosis not present

## 2014-01-21 DIAGNOSIS — H409 Unspecified glaucoma: Secondary | ICD-10-CM | POA: Diagnosis not present

## 2014-01-21 DIAGNOSIS — D131 Benign neoplasm of stomach: Secondary | ICD-10-CM | POA: Diagnosis not present

## 2014-01-21 DIAGNOSIS — I10 Essential (primary) hypertension: Secondary | ICD-10-CM | POA: Diagnosis not present

## 2014-01-21 DIAGNOSIS — R920 Mammographic microcalcification found on diagnostic imaging of breast: Secondary | ICD-10-CM | POA: Diagnosis not present

## 2014-01-21 DIAGNOSIS — E119 Type 2 diabetes mellitus without complications: Secondary | ICD-10-CM | POA: Diagnosis not present

## 2014-01-21 DIAGNOSIS — N6089 Other benign mammary dysplasias of unspecified breast: Secondary | ICD-10-CM | POA: Diagnosis not present

## 2014-01-21 DIAGNOSIS — IMO0001 Reserved for inherently not codable concepts without codable children: Secondary | ICD-10-CM | POA: Diagnosis not present

## 2014-01-21 DIAGNOSIS — Z803 Family history of malignant neoplasm of breast: Secondary | ICD-10-CM | POA: Diagnosis not present

## 2014-01-21 DIAGNOSIS — F3289 Other specified depressive episodes: Secondary | ICD-10-CM | POA: Diagnosis not present

## 2014-01-21 DIAGNOSIS — Z8673 Personal history of transient ischemic attack (TIA), and cerebral infarction without residual deficits: Secondary | ICD-10-CM | POA: Diagnosis not present

## 2014-01-21 DIAGNOSIS — Z6833 Body mass index (BMI) 33.0-33.9, adult: Secondary | ICD-10-CM | POA: Diagnosis not present

## 2014-01-21 DIAGNOSIS — F329 Major depressive disorder, single episode, unspecified: Secondary | ICD-10-CM | POA: Diagnosis not present

## 2014-01-21 LAB — CBC WITH DIFFERENTIAL/PLATELET
BASOS PCT: 0 % (ref 0–1)
Basophils Absolute: 0 10*3/uL (ref 0.0–0.1)
Eosinophils Absolute: 0.2 10*3/uL (ref 0.0–0.7)
Eosinophils Relative: 2 % (ref 0–5)
HCT: 38 % (ref 36.0–46.0)
Hemoglobin: 12.1 g/dL (ref 12.0–15.0)
Lymphocytes Relative: 29 % (ref 12–46)
Lymphs Abs: 3 10*3/uL (ref 0.7–4.0)
MCH: 27.1 pg (ref 26.0–34.0)
MCHC: 31.8 g/dL (ref 30.0–36.0)
MCV: 85.2 fL (ref 78.0–100.0)
MONO ABS: 0.6 10*3/uL (ref 0.1–1.0)
Monocytes Relative: 5 % (ref 3–12)
NEUTROS ABS: 6.5 10*3/uL (ref 1.7–7.7)
Neutrophils Relative %: 63 % (ref 43–77)
Platelets: 174 10*3/uL (ref 150–400)
RBC: 4.46 MIL/uL (ref 3.87–5.11)
RDW: 16.2 % — AB (ref 11.5–15.5)
WBC: 10.3 10*3/uL (ref 4.0–10.5)

## 2014-01-21 LAB — COMPREHENSIVE METABOLIC PANEL
ALBUMIN: 3.8 g/dL (ref 3.5–5.2)
ALT: 23 U/L (ref 0–35)
AST: 25 U/L (ref 0–37)
Alkaline Phosphatase: 83 U/L (ref 39–117)
Anion gap: 15 (ref 5–15)
BUN: 22 mg/dL (ref 6–23)
CHLORIDE: 101 meq/L (ref 96–112)
CO2: 26 mEq/L (ref 19–32)
CREATININE: 0.98 mg/dL (ref 0.50–1.10)
Calcium: 9.3 mg/dL (ref 8.4–10.5)
GFR calc Af Amer: 68 mL/min — ABNORMAL LOW (ref >90)
GFR calc non Af Amer: 59 mL/min — ABNORMAL LOW (ref >90)
Glucose, Bld: 330 mg/dL — ABNORMAL HIGH (ref 70–99)
Potassium: 3.9 mEq/L (ref 3.7–5.3)
Sodium: 142 mEq/L (ref 137–147)
Total Bilirubin: 0.3 mg/dL (ref 0.3–1.2)
Total Protein: 6.8 g/dL (ref 6.0–8.3)

## 2014-01-21 NOTE — Progress Notes (Signed)
01/21/14 [place]  Have you ever been diagnosed with sleep apnea through a sleep study? No  Do you snore loudly (loud enough to be heard through closed doors)?  0  Do you often feel tired, fatigued, or sleepy during the daytime? 0  Has anyone observed you stop breathing during your sleep? 0  Do you have, or are you being treated for high blood pressure? 1  BMI more than 35 kg/m2? 1  Age over 66 years old? 1  Neck circumference greater than 40 cm/16 inches? 1  Gender: 0  Obstructive Sleep Apnea Score 4  Score 4 or greater  Results sent to PCP

## 2014-01-21 NOTE — Progress Notes (Signed)
To come in for labs ihas been cleared by cardiology

## 2014-01-25 ENCOUNTER — Encounter (HOSPITAL_BASED_OUTPATIENT_CLINIC_OR_DEPARTMENT_OTHER): Payer: Self-pay | Admitting: *Deleted

## 2014-01-25 DIAGNOSIS — M5137 Other intervertebral disc degeneration, lumbosacral region: Secondary | ICD-10-CM | POA: Diagnosis not present

## 2014-01-25 DIAGNOSIS — I10 Essential (primary) hypertension: Secondary | ICD-10-CM | POA: Diagnosis not present

## 2014-01-25 DIAGNOSIS — M47817 Spondylosis without myelopathy or radiculopathy, lumbosacral region: Secondary | ICD-10-CM | POA: Diagnosis not present

## 2014-01-25 DIAGNOSIS — M48061 Spinal stenosis, lumbar region without neurogenic claudication: Secondary | ICD-10-CM | POA: Diagnosis not present

## 2014-01-25 NOTE — H&P (Signed)
[name]   MRN:  [id number]   Description: 68 year old female  Provider: Adin Hector, MD  Department: [facility]         Diagnoses      Breast mass, left    -  Primary      611.72        Current Vitals Most recent update: 01/07/2014  9:53 AM by Jeralyn Ruths, CMA      BP Pulse Resp Ht Wt BMI      140/60 72 16 5\' 4"  (1.626 m) 196 lb (88.905 kg) 33.63 kg/m2        Vitals History Recorded            History and Physical   Adin Hector, MD       Status: Signed            Patient ID: [name], female   DOB: [birth year], 68 y.o.   MRN: [id number]              HPI [name] is a 68 y.o. female.  She is referred by Dr. Phineas Real for a palpable mass in the left breast. Dr. Gordan Payment is her PCP. Dr. Sherren Mocha is her cardiologist. Dr. Oneida Alar as her vascular surgeon.   The patient has noticed a palpable mass in the left rest superiorly for about 6 weeks. Denies pain or nipple discharge or trauma. Mammograms and ultrasound of [place] showed only benign fibroglandular tissue. She has a clip in the right breast that is also noted.   She had a image guided biopsy of a right breast. She is vague about the details but states this was benign.   Dr. Phineas Real referred her and recommended excision of this mass.   Her morbidities include TIA on aspirin. Right carotid endarterectomy. Hypertension. Fibromyalgia. Coronary artery disease, followed by Dr. Ezzie Dural every 6 months, asthma, insulin-dependent diabetes mellitus, GERD.   Family history reveals breast cancer in a cousin,  no ovarian cancer. Brother has lung cancer         Past Medical History   Diagnosis  Date   .  CAD (coronary artery disease)         sees. Dr. Burt Knack. s/p cath with PCI of RCA (xience des) June 2010. Pt also with LAD and D2 dzs-NL FFR in both areas med Rx   .  HTN (hypertension)     .  Hyperlipidemia     .  TIA (transient ischemic attack)         also Hx of  it.    Marland Kitchen  GERD (gastroesophageal reflux disease)     .  Headache(784.0)     .  Anemia, unspecified     .  Edema     .  Depressive disorder, not elsewhere classified     .  Low back pain     .  Obesity     .  Duodenal nodule     .  Hemorrhoids     .  Arthritis     .  Glaucoma         narrow angle, sees Dr. Herbert Deaner    .  Type II or unspecified type diabetes mellitus without mention of complication, not stated as uncontrolled         sees Dr. Carrolyn Meiers    .  Gastric polyps         COLON   .  Allergy           Past Surgical History   Procedure  Laterality  Date   .  Back surgery       .  Bilateral carpal tunnel release       .  Colonoscopy    05-29-11       per Dr. Henrene Pastor, benign polyps, repeat in 5 yrs     .  Esophagogastroduodenoscopy    02-08-06       per Dr. Henrene Pastor, gastritis    .  Glaucoma surgery           with laser, per Dr. Henrene Pastor    .  Carotid endarterectomy    march 2012       per Dr. Ruta Hinds   .  Abdominal hysterectomy    age 71       TAH and USO  Leiomyomata         Family History   Problem  Relation  Age of Onset   .  Coronary artery disease  Neg Hx         premature CAD   .  Colon cancer  Neg Hx     .  Esophageal cancer  Neg Hx     .  Stomach cancer  Neg Hx     .  Hypertension  Mother     .  Hypertension  Sister     .  Hypertension  Brother     .  Cancer  Brother         PROSTATE/LUNG   .  Cancer  Father         Prostate and pancreatic         Social History History   Substance Use Topics   .  Smoking status:  Former Research scientist (life sciences)   .  Smokeless tobacco:  Never Used         Comment: non smoker   .  Alcohol Use:  No        No Known Allergies    Current Outpatient Prescriptions   Medication  Sig  Dispense  Refill   .  aspirin 81 MG tablet  Take 81 mg by mouth daily.          Marland Kitchen  azithromycin (ZITHROMAX) 250 MG tablet  2 tabs on day one then one tab daily   6 tablet   0   .  CRESTOR 40 MG tablet  TAKE ONE TABLET BY MOUTH EVERY DAY   30  tablet   3   .  diclofenac sodium (VOLTAREN) 1 % GEL  Apply 1 application topically as needed.           .  DULoxetine (CYMBALTA) 30 MG capsule           .  ergocalciferol (VITAMIN D2) 50000 UNITS capsule  Take 50,000 Units by mouth once a week.          .  furosemide (LASIX) 40 MG tablet  TAKE ONE TABLET BY MOUTH EVERY DAY   30 tablet   3   .  glucose blood test strip  1 each by Other route as needed. Use as instructed          .  hydrALAZINE (APRESOLINE) 100 MG tablet  TAKE ONE TABLET BY MOUTH THREE TIMES DAILY   90 tablet   0   .  HYDROcodone-homatropine (HYCODAN) 5-1.5 MG/5ML syrup  Take 5 mLs by mouth every 6 (six) hours as  needed for cough.   120 mL   0   .  insulin aspart (NOVOLOG) 100 UNIT/ML injection  Inject 7 Units into the skin 3 (three) times daily before meals.          .  insulin glargine (LANTUS) 100 UNIT/ML injection  Inject 27 Units into the skin daily.          .  irbesartan (AVAPRO) 300 MG tablet  TAKE ONE TABLET BY MOUTH ONCE DAILY   30 tablet   1   .  isosorbide mononitrate (IMDUR) 60 MG 24 hr tablet  TAKE ONE TABLET BY MOUTH EVERY DAY   30 tablet   6   .  latanoprost (XALATAN) 0.005 % ophthalmic solution  Place 1 drop into both eyes daily.          .  meloxicam (MOBIC) 15 MG tablet  Take 15 mg by mouth daily.          .  metoprolol succinate (TOPROL-XL) 100 MG 24 hr tablet  TAKE ONE TABLET BY MOUTH EVERY DAY   30 tablet   0   .  nitroGLYCERIN (NITROSTAT) 0.4 MG SL tablet  Place 0.4 mg under the tongue every 5 (five) minutes as needed.           .  pantoprazole (PROTONIX) 40 MG tablet  Take 1 tablet (40 mg total) by mouth daily.   30 tablet   5   .  spironolactone (ALDACTONE) 25 MG tablet  Take 1 tablet (25 mg total) by mouth daily.   90 tablet   3       No current facility-administered medications for this visit.        Review of Systems  Constitutional: Negative for fever, chills and unexpected weight change.  HENT: Negative for congestion, hearing loss, sore  throat, trouble swallowing and voice change.   Eyes: Negative for visual disturbance.  Respiratory: Negative for cough and wheezing.   Cardiovascular: Negative for chest pain, palpitations and leg swelling.  Gastrointestinal: Negative for nausea, vomiting, abdominal pain, diarrhea, constipation, blood in stool, abdominal distention and anal bleeding.  Genitourinary: Negative for hematuria, vaginal bleeding and difficulty urinating.  Musculoskeletal: Positive for back pain. Negative for arthralgias.  Skin: Negative for rash and wound.  Neurological: Negative for seizures, syncope and headaches.  Hematological: Negative for adenopathy. Does not bruise/bleed easily.  Psychiatric/Behavioral: Negative for confusion.      Blood pressure 140/60, pulse 72, resp. rate 16, height 5\' 4"  (1.626 m), weight 196 lb (88.905 kg).   Physical Exam   Constitutional: She is oriented to person, place, and time. She appears well-developed and well-nourished. No distress.  HENT:   Head: Normocephalic and atraumatic.   Nose: Nose normal.   Mouth/Throat: No oropharyngeal exudate.  Eyes: Conjunctivae and EOM are normal. Pupils are equal, round, and reactive to light. Left eye exhibits no discharge. No scleral icterus.  Neck: Neck supple. No JVD present. No tracheal deviation present. No thyromegaly present.  Right carotid endarterectomy scar well healed.  Cardiovascular: Normal rate, regular rhythm, normal heart sounds and intact distal pulses.    No murmur heard. Pulmonary/Chest: Effort normal and breath sounds normal. No respiratory distress. She has no wheezes. She has no rales. She exhibits no tenderness.    2.5 cm, firm, irregular, nontender mass left breast at 11:30 position. Overlying skin normal. No other mass in either breast. No nipple discharge. No axillary adenopathy.  Abdominal: Soft. Bowel sounds are normal. She exhibits  no distension and no mass. There is no tenderness. There is no rebound and  no guarding.  Pfannenstiel incision healed  Musculoskeletal: She exhibits no edema and no tenderness.  Lymphadenopathy:    She has no cervical adenopathy.  Neurological: She is alert and oriented to person, place, and time. She exhibits normal muscle tone. Coordination normal.  Skin: Skin is warm. No rash noted. She is not diaphoretic. No erythema. No pallor.  Psychiatric: She has a normal mood and affect. Her behavior is normal. Judgment and thought content normal.      Data Reviewed Imaging studies. Office notes from Dr. Phineas Real   Assessment     Irregular solid mass left breast, 11:30 position. Given the negative imaging studies this is more likely to be benign fibrosis or fat necrosis. However, because of its dominant nature and distinct margins, tissue diagnosis is indicated to rule out cancer   Insulin-dependent diabetes   Coronary artery disease on aspirin   History TIA   History right carotid endarterectomy   Hypertension   Fibromyalgia   Asthma   GERD.      Plan    We discussed this for the better part of 30 minutes. The patient is strongly motivated to have this area excised to prove it is not cancer. This is reasonable.   She'll be scheduled for a left breast lumpectomy in the near future. I told we would be relatively conservative with this, and that if it did show cancer we will probably have to re excise.    She'll be scheduled for left breast lumpectomy in the near future.   I discussed the indications, , details, techniques, and numerous risk of the surgery with her. She is aware of the risk of bleeding, infection, skin necrosis, cosmetic deformity, reoperation if this turns out to be cancer. She understands all these issues. All of her questions are answered. She agrees with this plan.   We will ask Dr. Sherren Mocha for risk assessment and whether we can stop the aspirin about 4 days preop.          Edsel Petrin. Dalbert Batman, M.D.,  [facility] General and Minimally invasive Surgery Breast and Colorectal Surgery Office:   [phone] Pager:   [phone]

## 2014-01-26 ENCOUNTER — Encounter (HOSPITAL_BASED_OUTPATIENT_CLINIC_OR_DEPARTMENT_OTHER): Payer: Medicare Other | Admitting: Anesthesiology

## 2014-01-26 ENCOUNTER — Ambulatory Visit (HOSPITAL_BASED_OUTPATIENT_CLINIC_OR_DEPARTMENT_OTHER): Payer: Medicare Other | Admitting: Anesthesiology

## 2014-01-26 ENCOUNTER — Encounter (HOSPITAL_BASED_OUTPATIENT_CLINIC_OR_DEPARTMENT_OTHER): Payer: Self-pay | Admitting: Anesthesiology

## 2014-01-26 ENCOUNTER — Ambulatory Visit (HOSPITAL_BASED_OUTPATIENT_CLINIC_OR_DEPARTMENT_OTHER)
Admission: RE | Admit: 2014-01-26 | Discharge: 2014-01-26 | Disposition: A | Discharge status: Home / Self Care | Payer: Medicare Other | Source: Ambulatory Visit | Attending: General Surgery | Admitting: General Surgery

## 2014-01-26 ENCOUNTER — Encounter (HOSPITAL_BASED_OUTPATIENT_CLINIC_OR_DEPARTMENT_OTHER)
Admission: RE | Disposition: A | Discharge status: Home / Self Care | Payer: Self-pay | Source: Ambulatory Visit | Attending: General Surgery

## 2014-01-26 DIAGNOSIS — N63 Unspecified lump in unspecified breast: Secondary | ICD-10-CM | POA: Diagnosis not present

## 2014-01-26 DIAGNOSIS — H4020X0 Unspecified primary angle-closure glaucoma, stage unspecified: Secondary | ICD-10-CM | POA: Insufficient documentation

## 2014-01-26 DIAGNOSIS — K649 Unspecified hemorrhoids: Secondary | ICD-10-CM | POA: Insufficient documentation

## 2014-01-26 DIAGNOSIS — K219 Gastro-esophageal reflux disease without esophagitis: Secondary | ICD-10-CM | POA: Insufficient documentation

## 2014-01-26 DIAGNOSIS — E785 Hyperlipidemia, unspecified: Secondary | ICD-10-CM | POA: Insufficient documentation

## 2014-01-26 DIAGNOSIS — R920 Mammographic microcalcification found on diagnostic imaging of breast: Secondary | ICD-10-CM | POA: Diagnosis not present

## 2014-01-26 DIAGNOSIS — Z87891 Personal history of nicotine dependence: Secondary | ICD-10-CM | POA: Insufficient documentation

## 2014-01-26 DIAGNOSIS — D4860 Neoplasm of uncertain behavior of unspecified breast: Secondary | ICD-10-CM | POA: Diagnosis not present

## 2014-01-26 DIAGNOSIS — Z79899 Other long term (current) drug therapy: Secondary | ICD-10-CM | POA: Insufficient documentation

## 2014-01-26 DIAGNOSIS — H409 Unspecified glaucoma: Secondary | ICD-10-CM | POA: Insufficient documentation

## 2014-01-26 DIAGNOSIS — N6320 Unspecified lump in the left breast, unspecified quadrant: Secondary | ICD-10-CM

## 2014-01-26 DIAGNOSIS — E119 Type 2 diabetes mellitus without complications: Secondary | ICD-10-CM | POA: Diagnosis not present

## 2014-01-26 DIAGNOSIS — F329 Major depressive disorder, single episode, unspecified: Secondary | ICD-10-CM | POA: Insufficient documentation

## 2014-01-26 DIAGNOSIS — Z803 Family history of malignant neoplasm of breast: Secondary | ICD-10-CM | POA: Insufficient documentation

## 2014-01-26 DIAGNOSIS — I2510 Atherosclerotic heart disease of native coronary artery without angina pectoris: Secondary | ICD-10-CM | POA: Insufficient documentation

## 2014-01-26 DIAGNOSIS — D131 Benign neoplasm of stomach: Secondary | ICD-10-CM | POA: Insufficient documentation

## 2014-01-26 DIAGNOSIS — J45909 Unspecified asthma, uncomplicated: Secondary | ICD-10-CM | POA: Insufficient documentation

## 2014-01-26 DIAGNOSIS — Z8673 Personal history of transient ischemic attack (TIA), and cerebral infarction without residual deficits: Secondary | ICD-10-CM | POA: Insufficient documentation

## 2014-01-26 DIAGNOSIS — Z6833 Body mass index (BMI) 33.0-33.9, adult: Secondary | ICD-10-CM | POA: Insufficient documentation

## 2014-01-26 DIAGNOSIS — E669 Obesity, unspecified: Secondary | ICD-10-CM | POA: Insufficient documentation

## 2014-01-26 DIAGNOSIS — Z791 Long term (current) use of non-steroidal anti-inflammatories (NSAID): Secondary | ICD-10-CM | POA: Insufficient documentation

## 2014-01-26 DIAGNOSIS — F3289 Other specified depressive episodes: Secondary | ICD-10-CM | POA: Insufficient documentation

## 2014-01-26 DIAGNOSIS — N6089 Other benign mammary dysplasias of unspecified breast: Secondary | ICD-10-CM | POA: Insufficient documentation

## 2014-01-26 DIAGNOSIS — I10 Essential (primary) hypertension: Secondary | ICD-10-CM | POA: Insufficient documentation

## 2014-01-26 DIAGNOSIS — Z794 Long term (current) use of insulin: Secondary | ICD-10-CM | POA: Insufficient documentation

## 2014-01-26 DIAGNOSIS — Z7982 Long term (current) use of aspirin: Secondary | ICD-10-CM | POA: Insufficient documentation

## 2014-01-26 DIAGNOSIS — IMO0001 Reserved for inherently not codable concepts without codable children: Secondary | ICD-10-CM | POA: Insufficient documentation

## 2014-01-26 DIAGNOSIS — M129 Arthropathy, unspecified: Secondary | ICD-10-CM | POA: Insufficient documentation

## 2014-01-26 HISTORY — PX: BREAST BIOPSY: SHX20

## 2014-01-26 LAB — GLUCOSE, CAPILLARY
GLUCOSE-CAPILLARY: 242 mg/dL — AB (ref 70–99)
Glucose-Capillary: 206 mg/dL — ABNORMAL HIGH (ref 70–99)

## 2014-01-26 SURGERY — BREAST BIOPSY
Anesthesia: General | Site: Breast | Laterality: Left

## 2014-01-26 MED ORDER — MIDAZOLAM HCL 5 MG/5ML IJ SOLN
INTRAMUSCULAR | Status: DC | PRN
  Administered 2014-01-26: 1 mg via INTRAVENOUS

## 2014-01-26 MED ORDER — ACETAMINOPHEN 10 MG/ML IV SOLN
INTRAVENOUS | Status: AC
  Filled 2014-01-26: qty 100

## 2014-01-26 MED ORDER — DEXAMETHASONE SODIUM PHOSPHATE 4 MG/ML IJ SOLN
INTRAMUSCULAR | Status: DC | PRN
  Administered 2014-01-26: 4 mg via INTRAVENOUS

## 2014-01-26 MED ORDER — HYDROMORPHONE HCL PF 1 MG/ML IJ SOLN
0.2500 mg | INTRAMUSCULAR | Status: DC | PRN
  Administered 2014-01-26 (×2): 0.5 mg via INTRAVENOUS

## 2014-01-26 MED ORDER — BUPIVACAINE-EPINEPHRINE (PF) 0.5% -1:200000 IJ SOLN
INTRAMUSCULAR | Status: AC
  Filled 2014-01-26: qty 30

## 2014-01-26 MED ORDER — FENTANYL CITRATE 0.05 MG/ML IJ SOLN: 50.0000 ug | INTRAMUSCULAR | Status: DC | PRN

## 2014-01-26 MED ORDER — OXYCODONE HCL 5 MG PO TABS: 5.0000 mg | ORAL_TABLET | Freq: Once | ORAL | Status: DC | PRN

## 2014-01-26 MED ORDER — INSULIN REGULAR HUMAN 100 UNIT/ML IJ SOLN
INTRAMUSCULAR | Status: DC | PRN
  Administered 2014-01-26: 5 [IU] via SUBCUTANEOUS

## 2014-01-26 MED ORDER — SODIUM BICARBONATE 4 % IV SOLN
INTRAVENOUS | Status: AC
  Filled 2014-01-26: qty 5

## 2014-01-26 MED ORDER — PROPOFOL 10 MG/ML IV BOLUS
INTRAVENOUS | Status: DC | PRN
  Administered 2014-01-26: 160 mg via INTRAVENOUS

## 2014-01-26 MED ORDER — MIDAZOLAM HCL 2 MG/2ML IJ SOLN: 1.0000 mg | INTRAMUSCULAR | Status: DC | PRN

## 2014-01-26 MED ORDER — ONDANSETRON HCL 4 MG/2ML IJ SOLN
INTRAMUSCULAR | Status: DC | PRN
  Administered 2014-01-26: 4 mg via INTRAVENOUS

## 2014-01-26 MED ORDER — PROMETHAZINE HCL 25 MG/ML IJ SOLN: 6.2500 mg | INTRAMUSCULAR | Status: DC | PRN

## 2014-01-26 MED ORDER — LIDOCAINE HCL (PF) 1 % IJ SOLN
INTRAMUSCULAR | Status: AC
  Filled 2014-01-26: qty 30

## 2014-01-26 MED ORDER — HYDROMORPHONE HCL PF 1 MG/ML IJ SOLN
INTRAMUSCULAR | Status: AC
  Filled 2014-01-26: qty 1

## 2014-01-26 MED ORDER — BUPIVACAINE-EPINEPHRINE 0.5% -1:200000 IJ SOLN
INTRAMUSCULAR | Status: DC | PRN
  Administered 2014-01-26: 10 mL

## 2014-01-26 MED ORDER — LIDOCAINE HCL (CARDIAC) 20 MG/ML IV SOLN
INTRAVENOUS | Status: DC | PRN
  Administered 2014-01-26: 50 mg via INTRAVENOUS

## 2014-01-26 MED ORDER — ACETAMINOPHEN 500 MG PO TABS
ORAL_TABLET | ORAL | Status: AC
  Filled 2014-01-26: qty 2

## 2014-01-26 MED ORDER — MIDAZOLAM HCL 2 MG/2ML IJ SOLN
INTRAMUSCULAR | Status: AC
  Filled 2014-01-26: qty 2

## 2014-01-26 MED ORDER — FENTANYL CITRATE 0.05 MG/ML IJ SOLN
INTRAMUSCULAR | Status: DC | PRN
  Administered 2014-01-26 (×2): 25 ug via INTRAVENOUS
  Administered 2014-01-26: 50 ug via INTRAVENOUS

## 2014-01-26 MED ORDER — LACTATED RINGERS IV SOLN
INTRAVENOUS | Status: DC
  Administered 2014-01-26: 10:00:00 via INTRAVENOUS

## 2014-01-26 MED ORDER — OXYCODONE HCL 5 MG/5ML PO SOLN: 5.0000 mg | Freq: Once | ORAL | Status: DC | PRN

## 2014-01-26 MED ORDER — CEFAZOLIN SODIUM-DEXTROSE 2-3 GM-% IV SOLR
2.0000 g | INTRAVENOUS | Status: AC
  Administered 2014-01-26: 2 g via INTRAVENOUS

## 2014-01-26 MED ORDER — ACETAMINOPHEN 10 MG/ML IV SOLN
INTRAVENOUS | Status: DC | PRN
  Administered 2014-01-26: 1000 mg via INTRAVENOUS

## 2014-01-26 MED ORDER — KETOROLAC TROMETHAMINE 30 MG/ML IJ SOLN
INTRAMUSCULAR | Status: DC | PRN
  Administered 2014-01-26: 30 mg via INTRAVENOUS

## 2014-01-26 MED ORDER — CHLORHEXIDINE GLUCONATE 4 % EX LIQD: 1.0000 "application " | Freq: Once | CUTANEOUS | Status: DC

## 2014-01-26 MED ORDER — HYDROCODONE-ACETAMINOPHEN 5-325 MG PO TABS: 1.0000 | ORAL_TABLET | Freq: Four times a day (QID) | ORAL | Status: DC | PRN

## 2014-01-26 MED ORDER — FENTANYL CITRATE 0.05 MG/ML IJ SOLN
INTRAMUSCULAR | Status: AC
  Filled 2014-01-26: qty 4

## 2014-01-26 SURGICAL SUPPLY — 57 items
APPLIER CLIP 9.375 MED OPEN (MISCELLANEOUS)
BANDAGE ELASTIC 6 VELCRO ST LF (GAUZE/BANDAGES/DRESSINGS) IMPLANT
BENZOIN TINCTURE PRP APPL 2/3 (GAUZE/BANDAGES/DRESSINGS) IMPLANT
BLADE SURG 15 STRL LF DISP TIS (BLADE) ×2 IMPLANT
BLADE SURG 15 STRL SS (BLADE) ×4
CANISTER SUCT 1200ML W/VALVE (MISCELLANEOUS) ×3 IMPLANT
CHLORAPREP W/TINT 26ML (MISCELLANEOUS) ×3 IMPLANT
CLIP APPLIE 9.375 MED OPEN (MISCELLANEOUS) IMPLANT
CLOSURE WOUND 1/2 X4 (GAUZE/BANDAGES/DRESSINGS)
COVER MAYO STAND STRL (DRAPES) ×3 IMPLANT
COVER TABLE BACK 60X90 (DRAPES) ×3 IMPLANT
DECANTER SPIKE VIAL GLASS SM (MISCELLANEOUS) IMPLANT
DERMABOND ADVANCED (GAUZE/BANDAGES/DRESSINGS)
DERMABOND ADVANCED .7 DNX12 (GAUZE/BANDAGES/DRESSINGS) IMPLANT
DEVICE DUBIN W/COMP PLATE 8390 (MISCELLANEOUS) IMPLANT
DRAPE LAPAROSCOPIC ABDOMINAL (DRAPES) IMPLANT
DRAPE LAPAROTOMY TRNSV 102X78 (DRAPE) IMPLANT
DRAPE PED LAPAROTOMY (DRAPES) ×3 IMPLANT
DRAPE UTILITY XL STRL (DRAPES) ×3 IMPLANT
ELECT REM PT RETURN 9FT ADLT (ELECTROSURGICAL) ×3
ELECTRODE REM PT RTRN 9FT ADLT (ELECTROSURGICAL) ×1 IMPLANT
GAUZE SPONGE 4X4 16PLY XRAY LF (GAUZE/BANDAGES/DRESSINGS) IMPLANT
GLOVE BIO SURGEON STRL SZ7 (GLOVE) ×3 IMPLANT
GLOVE EUDERMIC 7 POWDERFREE (GLOVE) ×3 IMPLANT
GLOVE EXAM NITRILE MD LF STRL (GLOVE) ×3 IMPLANT
GOWN STRL REUS W/ TWL LRG LVL3 (GOWN DISPOSABLE) IMPLANT
GOWN STRL REUS W/ TWL XL LVL3 (GOWN DISPOSABLE) ×2 IMPLANT
GOWN STRL REUS W/TWL LRG LVL3 (GOWN DISPOSABLE)
GOWN STRL REUS W/TWL XL LVL3 (GOWN DISPOSABLE) ×4
KIT MARKER MARGIN INK (KITS) IMPLANT
NEEDLE HYPO 22GX1.5 SAFETY (NEEDLE) IMPLANT
NEEDLE HYPO 25X1 1.5 SAFETY (NEEDLE) ×3 IMPLANT
NS IRRIG 1000ML POUR BTL (IV SOLUTION) ×3 IMPLANT
PACK BASIN DAY SURGERY FS (CUSTOM PROCEDURE TRAY) ×3 IMPLANT
PENCIL BUTTON HOLSTER BLD 10FT (ELECTRODE) ×3 IMPLANT
SLEEVE SCD COMPRESS KNEE MED (MISCELLANEOUS) ×3 IMPLANT
SPONGE GAUZE 4X4 12PLY STER LF (GAUZE/BANDAGES/DRESSINGS) IMPLANT
SPONGE LAP 4X18 X RAY DECT (DISPOSABLE) ×3 IMPLANT
STAPLER VISISTAT 35W (STAPLE) IMPLANT
STRIP CLOSURE SKIN 1/2X4 (GAUZE/BANDAGES/DRESSINGS) IMPLANT
SUT ETHILON 4 0 PS 2 18 (SUTURE) IMPLANT
SUT MNCRL AB 4-0 PS2 18 (SUTURE) IMPLANT
SUT SILK 2 0 SH (SUTURE) ×3 IMPLANT
SUT VIC AB 2-0 SH 27 (SUTURE)
SUT VIC AB 2-0 SH 27XBRD (SUTURE) IMPLANT
SUT VIC AB 3-0 FS2 27 (SUTURE) IMPLANT
SUT VIC AB 4-0 P-3 18XBRD (SUTURE) IMPLANT
SUT VIC AB 4-0 P3 18 (SUTURE)
SUT VICRYL 3-0 CR8 SH (SUTURE) ×3 IMPLANT
SUT VICRYL 4-0 PS2 18IN ABS (SUTURE) IMPLANT
SYR BULB 3OZ (MISCELLANEOUS) IMPLANT
SYRINGE CONTROL L 12CC (SYRINGE) ×3 IMPLANT
TAPE HYPAFIX 4 X10 (GAUZE/BANDAGES/DRESSINGS) IMPLANT
TOWEL OR NON WOVEN STRL DISP B (DISPOSABLE) ×3 IMPLANT
TUBE CONNECTING 20'X1/4 (TUBING) ×1
TUBE CONNECTING 20X1/4 (TUBING) ×2 IMPLANT
YANKAUER SUCT BULB TIP NO VENT (SUCTIONS) ×3 IMPLANT

## 2014-01-26 NOTE — Anesthesia Postprocedure Evaluation (Signed)
Anesthesia Post Note  Patient: [name]  Procedure(s) Performed: Procedure(s) (LRB): EXCISION LEFT BREAST MASS (Left)  Anesthesia type: General  Patient location: PACU  Post pain: Pain level controlled and Adequate analgesia  Post assessment: Post-op Vital signs reviewed, Patient's Cardiovascular Status Stable, Respiratory Function Stable, Patent Airway and Pain level controlled  Last Vitals:  Filed Vitals:   01/26/14 1145  BP: 187/80  Pulse: 76  Temp:   Resp: 10    Post vital signs: Reviewed and stable  Level of consciousness: awake, alert  and oriented  Complications: No apparent anesthesia complications

## 2014-01-26 NOTE — Transfer of Care (Signed)
Immediate Anesthesia Transfer of Care Note  Patient: [name]  Procedure(s) Performed: Procedure(s): EXCISION LEFT BREAST MASS (Left)  Patient Location: PACU  Anesthesia Type:General  Level of Consciousness: sedated  Airway & Oxygen Therapy: Patient Spontanous Breathing and Patient connected to face mask oxygen  Post-op Assessment: Report given to PACU RN and Post -op Vital signs reviewed and stable  Post vital signs: Reviewed and stable  Complications: No apparent anesthesia complications

## 2014-01-26 NOTE — Op Note (Addendum)
Patient Name:           [name]   Date of Surgery:        01/26/2014  Note: This dictation was prepared with Dragon/digital dictation along with [facility] technology. Any transcriptional errors that result from this process are unintentional.  Pre op Diagnosis:      Palpable mass left breast, 11:30 position with negative imaging studies  Post op Diagnosis:    Same  Procedure:                 Left breast lumpectomy with margin assessment  Surgeon:                     Edsel Petrin. Dalbert Batman, M.D., FACS  Assistant:                      None  Operative Indications:   [name] is a 68 y.o. female. She is referred by Dr. Phineas Real for a palpable mass in the left breast. Dr. Gordan Payment is her PCP. Dr. Sherren Mocha is her cardiologist. Dr. Oneida Alar is her vascular surgeon.  The patient has noticed a palpable mass in the left breast superiorly for about 6 weeks. Denies pain or nipple discharge or trauma. Mammograms and ultrasound of [place] showed only benign fibroglandular tissue. She has a clip in the right breast that is also noted.  She had a image guided biopsy of the right breast in the past. She is vague about the details but states this was benign.  Dr. Phineas Real referred her and recommended to her that she have  excision of this mass.  Her morbidities include TIA on aspirin. Right carotid endarterectomy. Hypertension. Fibromyalgia. Coronary artery disease, followed by Dr. Ezzie Dural every 6 months, asthma, insulin-dependent diabetes mellitus, GERD.  Family history reveals breast cancer in a cousin, no ovarian cancer. Brother has lung cancer    Operative Findings:       There is an irregular palpable mass in the left breast at the 11:30 position. Overlying skin and nipple are normal. This area was conservatively excised and appears to be intensely fibrotic breast tissue. The specimen was marked with silk sutures to orient the pathologist  Procedure in Detail:          Following the  induction of general anesthesia with LMA device the left breast was prepped and draped in a sterile fashion. Intravenous antibiotics were given. Surgical time out performed. 0.5% Marcaine with epinephrine was used as local infiltration anesthetic. A transverse curvilinear incision was  made about 2 cm above the areolar margin at the 12:00 position overlying the palpable mass. Dissection was carried into the breast tissue this area was very conservatively excised. The specimen was marked with a silk sutures to orient the pathologist at 3 cardinal positions. Specimen was sent to the lab. Hemostasis was excellent and achieved with electrocautery. The breast tissues were closed with interrupted sutures of 3-0 Vicryl. The skin was closed with a running subcuticular suture of 4-0 Monocryl and Dermabond. A breast binder was placed. The patient tolerated the procedure well was taken to PACU in stable condition. EBL 10 cc or less. Counts correct. Complications none.     Edsel Petrin. Dalbert Batman, M.D., FACS General and Minimally Invasive Surgery Breast and Colorectal Surgery  01/26/2014 10:39 AM

## 2014-01-26 NOTE — Interval H&P Note (Signed)
History and Physical Interval Note:  01/26/2014 9:29 AM  [name]  has presented today for surgery, with the diagnosis of breast mass  The goals and the  various methods of treatment have been discussed with the patient and family. After consideration of risks, benefits and other options for treatment, the patient has consented to  Procedure(s): EXCISION LEFT BREAST MASS (Left) as a surgical intervention .  [name] history has been reviewed, patient examined today , no change in status, stable for surgery.  I have reviewed the patient's chart and labs.  Questions were answered to the patient's satisfaction.     [name]

## 2014-01-26 NOTE — Anesthesia Procedure Notes (Signed)
Procedure Name: LMA Insertion Date/Time: 01/26/2014 10:03 AM Performed by: Lieutenant Diego Pre-anesthesia Checklist: Patient identified, Emergency Drugs available, Suction available and Patient being monitored Patient Re-evaluated:Patient Re-evaluated prior to inductionOxygen Delivery Method: Circle System Utilized Preoxygenation: Pre-oxygenation with 100% oxygen Intubation Type: IV induction Ventilation: Mask ventilation without difficulty LMA: LMA inserted LMA Size: 4.0 Number of attempts: 1 Airway Equipment and Method: bite block Placement Confirmation: positive ETCO2 and breath sounds checked- equal and bilateral Tube secured with: Tape Dental Injury: Teeth and Oropharynx as per pre-operative assessment

## 2014-01-26 NOTE — Anesthesia Preprocedure Evaluation (Signed)
Anesthesia Evaluation  Patient identified by MRN, date of birth, ID band Patient awake    Reviewed: Allergy & Precautions, H&P , NPO status , Patient's Chart, lab work & pertinent test results  Airway Mallampati: I  Neck ROM: Full    Dental   Pulmonary shortness of breath, asthma , former smoker,  breath sounds clear to auscultation        Cardiovascular hypertension, + CAD Rhythm:Regular Rate:Normal     Neuro/Psych    GI/Hepatic   Endo/Other  diabetes  Renal/GU      Musculoskeletal  (+) Fibromyalgia -  Abdominal (+) + obese,   Peds  Hematology   Anesthesia Other Findings   Reproductive/Obstetrics                           Anesthesia Physical Anesthesia Plan  ASA: III  Anesthesia Plan: General   Post-op Pain Management:    Induction: Intravenous  Airway Management Planned: LMA  Additional Equipment:   Intra-op Plan:   Post-operative Plan: Extubation in OR  Informed Consent: I have reviewed the patients History and Physical, chart, labs and discussed the procedure including the risks, benefits and alternatives for the proposed anesthesia with the patient or authorized representative who has indicated his/her understanding and acceptance.   Dental advisory given  Plan Discussed with: CRNA and Surgeon  Anesthesia Plan Comments:         Anesthesia Quick Evaluation

## 2014-01-26 NOTE — Discharge Instructions (Signed)
[facility] °Office Phone Number [phone] ° °BREAST BIOPSY/ PARTIAL MASTECTOMY: POST OP INSTRUCTIONS ° °Always review your discharge instruction sheet given to you by the facility where your surgery was performed. ° °IF YOU HAVE DISABILITY OR FAMILY LEAVE FORMS, YOU MUST BRING THEM TO THE OFFICE FOR PROCESSING.  DO NOT GIVE THEM TO YOUR DOCTOR. ° °1. A prescription for pain medication may be given to you upon discharge.  Take your pain medication as prescribed, if needed.  If narcotic pain medicine is not needed, then you may take acetaminophen (Tylenol) or ibuprofen (Advil) as needed. °2. Take your usually prescribed medications unless otherwise directed °3. If you need a refill on your pain medication, please contact your pharmacy.  They will contact our office to request authorization.  Prescriptions will not be filled after 5pm or on week-ends. °4. You should eat very light the first 24 hours after surgery, such as soup, crackers, pudding, etc.  Resume your normal diet the day after surgery. °5. Most patients will experience some swelling and bruising in the breast.  Ice packs and a good support bra will help.  Swelling and bruising can take several days to resolve.  °6. It is common to experience some constipation if taking pain medication after surgery.  Increasing fluid intake and taking a stool softener will usually help or prevent this problem from occurring.  A mild laxative (Milk of Magnesia or Miralax) should be taken according to package directions if there are no bowel movements after 48 hours. °7. Unless discharge instructions indicate otherwise, you may remove your bandages 24-48 hours after surgery, and you may shower at that time.  You may have steri-strips (small skin tapes) in place directly over the incision.  These strips should be left on the skin for 7-10 days.  If your surgeon used skin glue on the incision, you may shower in 24 hours.  The glue will flake off over the  next 2-3 weeks.  Any sutures or staples will be removed at the office during your follow-up visit. °8. ACTIVITIES:  You may resume regular daily activities (gradually increasing) beginning the next day.  Wearing a good support bra or sports bra minimizes pain and swelling.  You may have sexual intercourse when it is comfortable. °a. You may drive when you no longer are taking prescription pain medication, you can comfortably wear a seatbelt, and you can safely maneuver your car and apply brakes. °b. RETURN TO WORK:  ______________________________________________________________________________________ °9. You should see your doctor in the office for a follow-up appointment approximately two weeks after your surgery.  Your doctor’s nurse will typically make your follow-up appointment when she calls you with your pathology report.  Expect your pathology report 2-3 business days after your surgery.  You may call to check if you do not hear from us after three days. °10. OTHER INSTRUCTIONS: _______________________________________________________________________________________________ _____________________________________________________________________________________________________________________________________ °_____________________________________________________________________________________________________________________________________ °_____________________________________________________________________________________________________________________________________ ° °WHEN TO CALL YOUR DOCTOR: °1. Fever over 101.0 °2. Nausea and/or vomiting. °3. Extreme swelling or bruising. °4. Continued bleeding from incision. °5. Increased pain, redness, or drainage from the incision. ° °The clinic staff is available to answer your questions during regular business hours.  Please don’t hesitate to call and ask to speak to one of the nurses for clinical concerns.  If you have a medical emergency, go to the nearest  emergency room or call 911.  A surgeon from [facility] is always on call at the hospital. ° °For further questions, please visit [email or web address]  ° ° °  Post Anesthesia Home Care Instructions ° °Activity: °Get plenty of rest for the remainder of the day. A responsible adult should stay with you for 24 hours following the procedure.  °For the next 24 hours, DO NOT: °-Drive a car °-Operate machinery °-Drink alcoholic beverages °-Take any medication unless instructed by your physician °-Make any legal decisions or sign important papers. ° °Meals: °Start with liquid foods such as gelatin or soup. Progress to regular foods as tolerated. Avoid greasy, spicy, heavy foods. If nausea and/or vomiting occur, drink only clear liquids until the nausea and/or vomiting subsides. Call your physician if vomiting continues. ° °Special Instructions/Symptoms: °Your throat may feel dry or sore from the anesthesia or the breathing tube placed in your throat during surgery. If this causes discomfort, gargle with warm salt water. The discomfort should disappear within 24 hours. ° °

## 2014-01-27 ENCOUNTER — Encounter (HOSPITAL_BASED_OUTPATIENT_CLINIC_OR_DEPARTMENT_OTHER): Payer: Self-pay | Admitting: General Surgery

## 2014-01-27 ENCOUNTER — Telehealth (INDEPENDENT_AMBULATORY_CARE_PROVIDER_SITE_OTHER): Payer: Self-pay

## 2014-01-27 NOTE — Telephone Encounter (Signed)
LMOM for pt to call to confirm appt and for po call.

## 2014-01-28 ENCOUNTER — Telehealth (INDEPENDENT_AMBULATORY_CARE_PROVIDER_SITE_OTHER): Payer: Self-pay

## 2014-01-28 ENCOUNTER — Encounter: Payer: Self-pay | Admitting: Family Medicine

## 2014-01-28 ENCOUNTER — Ambulatory Visit (INDEPENDENT_AMBULATORY_CARE_PROVIDER_SITE_OTHER): Payer: Medicare Other | Admitting: Family Medicine

## 2014-01-28 VITALS — BP 190/78 | Temp 99.1°F | Ht 64.0 in | Wt 200.0 lb

## 2014-01-28 DIAGNOSIS — I6529 Occlusion and stenosis of unspecified carotid artery: Secondary | ICD-10-CM

## 2014-01-28 DIAGNOSIS — K219 Gastro-esophageal reflux disease without esophagitis: Secondary | ICD-10-CM

## 2014-01-28 DIAGNOSIS — E119 Type 2 diabetes mellitus without complications: Secondary | ICD-10-CM

## 2014-01-28 DIAGNOSIS — E785 Hyperlipidemia, unspecified: Secondary | ICD-10-CM

## 2014-01-28 DIAGNOSIS — I10 Essential (primary) hypertension: Secondary | ICD-10-CM | POA: Diagnosis not present

## 2014-01-28 LAB — TSH: TSH: 0.97 u[IU]/mL (ref 0.35–4.50)

## 2014-01-28 LAB — LIPID PANEL
CHOLESTEROL: 111 mg/dL (ref 0–200)
HDL: 46.9 mg/dL (ref >39.00)
LDL Cholesterol: 29 mg/dL (ref 0–99)
NonHDL: 64.1
Total CHOL/HDL Ratio: 2
Triglycerides: 174 mg/dL — ABNORMAL HIGH (ref 0.0–149.0)
VLDL: 34.8 mg/dL (ref 0.0–40.0)

## 2014-01-28 MED ORDER — PANTOPRAZOLE SODIUM 40 MG PO TBEC: 40.0000 mg | DELAYED_RELEASE_TABLET | Freq: Every day | ORAL | Status: DC

## 2014-01-28 NOTE — Progress Notes (Signed)
Pre visit review using our clinic review tool, if applicable. No additional management support is needed unless otherwise documented below in the visit note. 

## 2014-01-28 NOTE — Telephone Encounter (Signed)
V/M - patient to call For Path results ; No CA found , no sx needed , DR. Dalbert Batman will discuss at next ov . Per DR. Dalbert Batman

## 2014-01-28 NOTE — Progress Notes (Signed)
Quick Note:  Inform patient of Pathology report,. No cancer failed. No further surgery necessary. We'll discuss further at next office visit.  hmi ______

## 2014-01-28 NOTE — Telephone Encounter (Signed)
Informed pt of message below. Pt verbalized understanding. Pt states that she would like to reschedule appt for a earlier morning appt. Informed pt that I would get this message to [facility] to see where she can work you in for a am appt.

## 2014-01-28 NOTE — Progress Notes (Signed)
   Subjective:    Patient ID: [name], female    DOB: [birth year], 68 y.o.   MRN: [id number]  HPI 68 yr old female for a cpx. She feels well except for her chronic back pain. She sees Dr. Burt Knack for her heart disease and HTN, and she sees Dr. Forde Dandy for her diabetes. She had a lumpectomy per Dr. Dalbert Batman 2 days ago on the left breast, and she is waiting for the pathology report to come back.    Review of Systems  Constitutional: Negative.   HENT: Negative.   Eyes: Negative.   Respiratory: Negative.   Cardiovascular: Negative.   Gastrointestinal: Negative.   Genitourinary: Negative for dysuria, urgency, frequency, hematuria, flank pain, decreased urine volume, enuresis, difficulty urinating, pelvic pain and dyspareunia.  Musculoskeletal: Negative.   Skin: Negative.   Neurological: Negative.   Psychiatric/Behavioral: Negative.        Objective:   Physical Exam  Constitutional: She is oriented to person, place, and time. She appears well-developed and well-nourished. No distress.  HENT:  Head: Normocephalic and atraumatic.  Right Ear: External ear normal.  Left Ear: External ear normal.  Nose: Nose normal.  Mouth/Throat: Oropharynx is clear and moist. No oropharyngeal exudate.  Eyes: Conjunctivae and EOM are normal. Pupils are equal, round, and reactive to light. No scleral icterus.  Neck: Normal range of motion. Neck supple. No JVD present. No thyromegaly present.  Cardiovascular: Normal rate, regular rhythm, normal heart sounds and intact distal pulses.  Exam reveals no gallop and no friction rub.   No murmur heard. Pulmonary/Chest: Effort normal and breath sounds normal. No respiratory distress. She has no wheezes. She has no rales. She exhibits no tenderness.  Abdominal: Soft. Bowel sounds are normal. She exhibits no distension and no mass. There is no tenderness. There is no rebound and no guarding.  Musculoskeletal: Normal range of motion. She exhibits no edema and no  tenderness.  Lymphadenopathy:    She has no cervical adenopathy.  Neurological: She is alert and oriented to person, place, and time. She has normal reflexes. No cranial nerve deficit. She exhibits normal muscle tone. Coordination normal.  Skin: Skin is warm and dry. No rash noted. No erythema.  Psychiatric: She has a normal mood and affect. Her behavior is normal. Judgment and thought content normal.          Assessment & Plan:  Well exam. Get fasting labs

## 2014-01-29 ENCOUNTER — Telehealth: Payer: Self-pay | Admitting: Family Medicine

## 2014-01-29 NOTE — Telephone Encounter (Signed)
Relevant patient education assigned to patient using Emmi. ° °

## 2014-02-04 DIAGNOSIS — H2510 Age-related nuclear cataract, unspecified eye: Secondary | ICD-10-CM | POA: Diagnosis not present

## 2014-02-04 DIAGNOSIS — H25019 Cortical age-related cataract, unspecified eye: Secondary | ICD-10-CM | POA: Diagnosis not present

## 2014-02-04 DIAGNOSIS — H402290 Chronic angle-closure glaucoma, unspecified eye, stage unspecified: Secondary | ICD-10-CM | POA: Diagnosis not present

## 2014-02-04 DIAGNOSIS — H409 Unspecified glaucoma: Secondary | ICD-10-CM | POA: Diagnosis not present

## 2014-02-08 ENCOUNTER — Other Ambulatory Visit: Payer: Self-pay | Admitting: Cardiovascular Disease

## 2014-02-12 ENCOUNTER — Ambulatory Visit (INDEPENDENT_AMBULATORY_CARE_PROVIDER_SITE_OTHER): Payer: Medicare Other | Admitting: General Surgery

## 2014-02-12 ENCOUNTER — Encounter (INDEPENDENT_AMBULATORY_CARE_PROVIDER_SITE_OTHER): Payer: Self-pay | Admitting: General Surgery

## 2014-02-12 ENCOUNTER — Encounter (INDEPENDENT_AMBULATORY_CARE_PROVIDER_SITE_OTHER): Payer: Medicare Other | Admitting: General Surgery

## 2014-02-12 VITALS — BP 116/78 | HR 75 | Temp 97.5°F | Ht 64.0 in | Wt 197.0 lb

## 2014-02-12 DIAGNOSIS — N63 Unspecified lump in unspecified breast: Secondary | ICD-10-CM

## 2014-02-12 DIAGNOSIS — N62 Hypertrophy of breast: Secondary | ICD-10-CM

## 2014-02-12 DIAGNOSIS — N6320 Unspecified lump in the left breast, unspecified quadrant: Secondary | ICD-10-CM

## 2014-02-12 DIAGNOSIS — N6092 Unspecified benign mammary dysplasia of left breast: Secondary | ICD-10-CM

## 2014-02-12 NOTE — Addendum Note (Signed)
Addended by: Illene Regulus on: 02/12/2014 03:39 PM   Modules accepted: Orders

## 2014-02-12 NOTE — Patient Instructions (Signed)
You are recovering from your left breast lumpectomy without any obvious surgical complications.  Your pathology report shows atypical lobular hyperplasia.  This is not cancer and you do not need any further surgery or treatment  This finding does increase the odds of breast cancer in the future, somewhat.  Be sure to get annual mammograms and annual physician breast exam  You'll be referred to a medical oncologist at the high risk breast clinic to discuss other options and  strategies to reduce your  risk.  Return to see Dr. Dalbert Batman if necessary.

## 2014-02-12 NOTE — Progress Notes (Signed)
Patient ID: [name], female   DOB: 1946/06/04, 68 y.o.   MRN: [id number]  History: This patient underwent left breast lumpectomy at the 11:30 position for a palpable abnormality.  Imaging studies were unrevealing.The final pathology report showed atypical lobular hyperplasia. She has no complaints about healing. We discussed the pathology report in detail. Family history reveals breast cancer in a first cousin had breast cancer and a second cousin, but no ovarian cancer. Brother had prostate cancer with lung metastasis.  Exam: Patient looks well. No distress Transverse incision left breast 12:00 position is healing uneventfully. No seroma or hematoma. No infection. Skin healthy  Assessment: Atypical lobular hyperplasia left breast, 2:00 position, presenting as palpable mass with benign imaging findings recovering uneventfully following left lumpectomy. Family history positive for breast cancer in second line relatives and prostate cancer in mother  Plan: I told her that no further treatment would be necessary at this time I discussed her increased risk Annual mammography and annual breast exam is urged We talked about referral to the high risk breast clinic and she would like to do that, although she's not sure that she would accept chemoprevention. Nevertheless, she would like the consultation. She will be referred to the [facility].   Edsel Petrin. Dalbert Batman, M.D., [facility] General and Minimally invasive Surgery Breast and Colorectal Surgery Office:   [phone] Pager:   [phone]

## 2014-02-15 ENCOUNTER — Telehealth: Payer: Self-pay | Admitting: Family Medicine

## 2014-02-15 NOTE — Telephone Encounter (Signed)
Pt would dr fry to accept her son as new pt. Her son needs appt ASAP. Can I create 30 min appt?

## 2014-02-16 NOTE — Telephone Encounter (Signed)
lmom for pt to cb

## 2014-02-16 NOTE — Telephone Encounter (Signed)
Sorry but I am too full  

## 2014-02-16 NOTE — Telephone Encounter (Signed)
Pt mom is aware 

## 2014-02-18 ENCOUNTER — Telehealth: Payer: Self-pay | Admitting: *Deleted

## 2014-02-18 NOTE — Telephone Encounter (Signed)
Left message for a return phone call to get patient schedule for high risk clinic.  Awaiting patient response.

## 2014-02-23 ENCOUNTER — Telehealth: Payer: Self-pay | Admitting: *Deleted

## 2014-02-23 NOTE — Telephone Encounter (Signed)
Pt returned my call and I confirmed 09/05/13 high risk appt w/ her.  Emailed Lars Mage and [place] at [facility] to make them aware.

## 2014-02-23 NOTE — Telephone Encounter (Signed)
Left message for pt to return my call so I can schedule her for the high risk clinic.

## 2014-03-05 ENCOUNTER — Ambulatory Visit (HOSPITAL_BASED_OUTPATIENT_CLINIC_OR_DEPARTMENT_OTHER): Payer: Medicare Other | Admitting: Hematology

## 2014-03-05 ENCOUNTER — Other Ambulatory Visit: Payer: Medicare Other

## 2014-03-05 VITALS — BP 183/60 | HR 79 | Temp 98.4°F | Resp 18 | Ht 64.0 in | Wt 197.3 lb

## 2014-03-05 DIAGNOSIS — I10 Essential (primary) hypertension: Secondary | ICD-10-CM

## 2014-03-05 DIAGNOSIS — I739 Peripheral vascular disease, unspecified: Secondary | ICD-10-CM | POA: Diagnosis not present

## 2014-03-05 DIAGNOSIS — IMO0001 Reserved for inherently not codable concepts without codable children: Secondary | ICD-10-CM | POA: Diagnosis not present

## 2014-03-05 DIAGNOSIS — N6092 Unspecified benign mammary dysplasia of left breast: Secondary | ICD-10-CM

## 2014-03-05 DIAGNOSIS — Z951 Presence of aortocoronary bypass graft: Secondary | ICD-10-CM | POA: Diagnosis not present

## 2014-03-05 DIAGNOSIS — Z8679 Personal history of other diseases of the circulatory system: Secondary | ICD-10-CM | POA: Diagnosis not present

## 2014-03-05 DIAGNOSIS — E785 Hyperlipidemia, unspecified: Secondary | ICD-10-CM | POA: Diagnosis not present

## 2014-03-05 DIAGNOSIS — E119 Type 2 diabetes mellitus without complications: Secondary | ICD-10-CM

## 2014-03-05 DIAGNOSIS — I6529 Occlusion and stenosis of unspecified carotid artery: Secondary | ICD-10-CM

## 2014-03-05 DIAGNOSIS — N62 Hypertrophy of breast: Secondary | ICD-10-CM

## 2014-03-05 DIAGNOSIS — N951 Menopausal and female climacteric states: Secondary | ICD-10-CM

## 2014-03-10 ENCOUNTER — Encounter: Payer: Self-pay | Admitting: Family

## 2014-03-11 ENCOUNTER — Ambulatory Visit (INDEPENDENT_AMBULATORY_CARE_PROVIDER_SITE_OTHER): Payer: Medicare Other | Admitting: Family

## 2014-03-11 ENCOUNTER — Encounter: Payer: Self-pay | Admitting: Family

## 2014-03-11 ENCOUNTER — Ambulatory Visit (HOSPITAL_COMMUNITY)
Admission: RE | Admit: 2014-03-11 | Discharge: 2014-03-11 | Disposition: A | Discharge status: Home / Self Care | Payer: Medicare Other | Source: Ambulatory Visit | Attending: Family | Admitting: Family

## 2014-03-11 VITALS — BP 152/72 | HR 71 | Resp 16 | Ht 64.5 in | Wt 198.0 lb

## 2014-03-11 DIAGNOSIS — Z48812 Encounter for surgical aftercare following surgery on the circulatory system: Secondary | ICD-10-CM | POA: Diagnosis not present

## 2014-03-11 DIAGNOSIS — I6529 Occlusion and stenosis of unspecified carotid artery: Secondary | ICD-10-CM | POA: Insufficient documentation

## 2014-03-11 DIAGNOSIS — M79609 Pain in unspecified limb: Secondary | ICD-10-CM | POA: Diagnosis not present

## 2014-03-11 DIAGNOSIS — M79604 Pain in right leg: Secondary | ICD-10-CM | POA: Insufficient documentation

## 2014-03-11 NOTE — Patient Instructions (Signed)
Stroke Prevention Some medical conditions and behaviors are associated with an increased chance of having a stroke. You may prevent a stroke by making healthy choices and managing medical conditions. HOW CAN I REDUCE MY RISK OF HAVING A STROKE?   Stay physically active. Get at least 30 minutes of activity on most or all days.  Do not smoke. It may also be helpful to avoid exposure to secondhand smoke.  Limit alcohol use. Moderate alcohol use is considered to be:  No more than 2 drinks per day for men.  No more than 1 drink per day for nonpregnant women.  Eat healthy foods. This involves:  Eating 5 or more servings of fruits and vegetables a day.  Making dietary changes that address high blood pressure (hypertension), high cholesterol, diabetes, or obesity.  Manage your cholesterol levels.  Making food choices that are high in fiber and low in saturated fat, trans fat, and cholesterol may control cholesterol levels.  Take any prescribed medicines to control cholesterol as directed by your health care provider.  Manage your diabetes.  Controlling your carbohydrate and sugar intake is recommended to manage diabetes.  Take any prescribed medicines to control diabetes as directed by your health care provider.  Control your hypertension.  Making food choices that are low in salt (sodium), saturated fat, trans fat, and cholesterol is recommended to manage hypertension.  Take any prescribed medicines to control hypertension as directed by your health care provider.  Maintain a healthy weight.  Reducing calorie intake and making food choices that are low in sodium, saturated fat, trans fat, and cholesterol are recommended to manage weight.  Stop drug abuse.  Avoid taking birth control pills.  Talk to your health care provider about the risks of taking birth control pills if you are over 35 years old, smoke, get migraines, or have ever had a blood clot.  Get evaluated for sleep  disorders (sleep apnea).  Talk to your health care provider about getting a sleep evaluation if you snore a lot or have excessive sleepiness.  Take medicines only as directed by your health care provider.  For some people, aspirin or blood thinners (anticoagulants) are helpful in reducing the risk of forming abnormal blood clots that can lead to stroke. If you have the irregular heart rhythm of atrial fibrillation, you should be on a blood thinner unless there is a good reason you cannot take them.  Understand all your medicine instructions.  Make sure that other conditions (such as anemia or atherosclerosis) are addressed. SEEK IMMEDIATE MEDICAL CARE IF:   You have sudden weakness or numbness of the face, arm, or leg, especially on one side of the body.  Your face or eyelid droops to one side.  You have sudden confusion.  You have trouble speaking (aphasia) or understanding.  You have sudden trouble seeing in one or both eyes.  You have sudden trouble walking.  You have dizziness.  You have a loss of balance or coordination.  You have a sudden, severe headache with no known cause.  You have new chest pain or an irregular heartbeat. Any of these symptoms may represent a serious problem that is an emergency. Do not wait to see if the symptoms will go away. Get medical help at once. Call your local emergency services (911 in [place] Do not drive yourself to the hospital. Document Released: 08/02/2004 Document Revised: 11/09/2013 Document Reviewed: 12/26/2012 Exit[employer] Patient Information 2015 [employer]. This information is not intended to replace advice given   to you by your health care provider. Make sure you discuss any questions you have with your health care provider.  

## 2014-03-11 NOTE — Addendum Note (Signed)
Addended by: Mena Goes on: 03/11/2014 03:14 PM   Modules accepted: Orders
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
## 2014-03-21 ENCOUNTER — Encounter: Payer: Self-pay | Admitting: Hematology

## 2014-03-21 NOTE — Patient Instructions (Signed)
Information given on [facility] and Arimidex

## 2014-03-21 NOTE — Progress Notes (Addendum)
03/22/14: ADDENDUM: I called and spoke with patient on phone at 2000 EST on 03/22/2014 to see what her plans are regarding starting Arimidex. She states that her husband is having a back procedure in 1-2 weeks, then she is going to get a back surgery in 3-4 weeks. Once her surgery is done and she recuperates from it, she will call and schedule an appointment with [place] and would like to go on chemoprevention Arimidex. She also said that she will email me about her tentative surgery date so I can create an appointment few weeks after that. I think that is reasonable. [name] Bell, MD    [name] NOTE 03/05/2014   Patient Care Team: [name] Morale, MD as PCP - [place], MD as Consulting Physician (Endocrinology) [name] Mocha, MD as Consulting Physician (Cardiology) [name] Pall, MD as Consulting Physician (Neurosurgery) [name] Skates, MD (Surgeon) [name] [name] (Gynecologist)  CHIEF COMPLAINTS/PURPOSE OF CONSULTATION:   Atypical Lobular Hyperplasia [facility])  HISTORY OF PRESENTING ILLNESS:   [name] [name] 68 y.o. female from [place], [name] is here because of recent diagnosis of Left breast [place] which is a precursor lesion and imparts a higher future risk for developing invasive and non-invasive breast cancers in future. She also have several comorbid conditions like Diabetes, HTN, Chronic back pain, Coronary artery disease (1 stent), TIA and fibromyalgia.  She had history of a right breast biopsy on July 27,2010 consistent with benign stromal calcifications and no malignancy. She noticed a lump in left breast this year in May 2015 as she keeps her extra money in bra and she noticed a change in her breast when she was removing money. She underwent a 3D diagnostic mammogram and an ultrasound on 11/18/2013 read as benign and there was no sonographic evidence of malignancy. Patient saw Dr [name] [name] in surgery after being referred by Dr [name] [name] for this  palpable lump with negative imaging studies.  She then had a lumpectomy done on 01/26/2014 and the pathology from that showed [place] (Atypical Lobular Hyperplasia). There was no invasive cancer or cancer in situ and margins were negative.Pathology accession no is [id number]. She is now referred to high risk breast clinic to discuss chemoprevention strategies.she is up to date with her pap smear, colonoscopy and bone density.  I reviewed her records extensively and collaborated the history with the patient.  In terms of breast cancer risk profile:  She menarched at early age of 61 and went to menopause at age 60 (TAH due to fibroids). She had 2 pregnancies resulting in miscarriagesy, no biological children, 1 adopted boy.  She did not breast-fed.  She received birth control pills for couple of years and also had IUD placed.  She was never exposed to fertility medications but took HRT for few years.  She has + family history of Breast cancer. 2 maternal cousins developed breast cancer (age 7's and 33's) and her maternal aunt had breast cancer also. 1 brother with prostate cancer. 1 sister healthy as a horse. Father had prostate cancer and stomach cancer. Adopted boy is 63 years old. Mother alive at age 10 with early dementia.No ovarian cancer in family.  MEDICAL HISTORY:  Past Medical History  Diagnosis Date  . HTN (hypertension)   . Hyperlipidemia   . TIA (transient ischemic attack)     also Hx of it.   Marland Kitchen GERD (gastroesophageal reflux disease)   . Headache(784.0)   . Anemia, unspecified   . Edema   . Depressive  disorder, not elsewhere classified   . Low back pain   . Obesity   . Duodenal nodule   . Hemorrhoids   . Arthritis   . Glaucoma     narrow angle, sees Dr. Herbert [name]   . Type II or unspecified type diabetes mellitus without mention of complication, not stated as uncontrolled     sees Dr. Carrolyn [name]   . Gastric polyps     COLON  . Allergy   . CAD (coronary artery disease)      sees. Dr. Burt [name]. s/p cath with PCI of RCA (xience des) June 2010. Pt also with LAD and D2 dzs-NL FFR in both areas med Rx  . Stroke   . Peripheral vascular disease     SURGICAL HISTORY: Past Surgical History  Procedure Laterality Date  . Bilateral carpal tunnel release    . Colonoscopy  05-29-11    per Dr. Henrene [name], benign polyps, repeat in 5 yrs    . Esophagogastroduodenoscopy  02-08-06    per Dr. Henrene [name], gastritis   . Glaucoma surgery      with laser, per Dr. Henrene [name]   . Carotid endarterectomy  march 2012    per Dr. Morton [name]  . Abdominal hysterectomy  age 28    TAH and USO  Leiomyomata  . Back surgery  2008    lumb fusion  . Cholecystectomy    . Dilation and curettage of uterus    . Breast biopsy Left 01/26/2014    Procedure: EXCISION LEFT BREAST MASS;  Surgeon: Karen Hector, MD;  Location: [facility];  Service: General;  Laterality: Left;    SOCIAL HISTORY: History   Social History  . Marital Status: Married    Spouse Name: N/A    Number of Children: N/A  . Years of Education: N/A   Occupational History  . Not on file.   Social History Main Topics  . Smoking status: Former Smoker    Quit date: 01/22/1967  . Smokeless tobacco: Never Used     Comment: non smoker  . Alcohol Use: No  . Drug Use: No  . Sexual Activity: No     Comment: HYST   Other Topics Concern  . Not on file   Social History Narrative  . No narrative on file    FAMILY HISTORY: Family History  Problem Relation Age of Onset  . Coronary artery disease Neg Hx     premature CAD  . Colon cancer Neg Hx   . Esophageal cancer Neg Hx   . Stomach cancer Neg Hx   . Hypertension Mother   . Hypertension Sister   . Hyperlipidemia Sister   . Hypertension Brother   . Cancer Brother     PROSTATE/LUNG  . Diabetes Brother   . Heart disease Brother     Before age 1 - Bypass  . Varicose Veins Brother   . Cancer Father     Prostate and pancreatic     ALLERGIES:  has No Known  Allergies.  MEDICATIONS:  Current Outpatient Prescriptions  Medication Sig Dispense Refill  . aspirin 81 MG tablet Take 81 mg by mouth daily.       . cloNIDine (CATAPRES) 0.1 MG tablet Take 1 tablet (0.1 mg total) by mouth 2 (two) times daily.  180 tablet  3  . CRESTOR 40 MG tablet TAKE ONE TABLET BY MOUTH ONCE DAILY  30 tablet  5  . diclofenac sodium (VOLTAREN) 1 % GEL Apply 1 application  topically as needed.        . DULoxetine (CYMBALTA) 30 MG capsule       . ergocalciferol (VITAMIN D2) 50000 UNITS capsule Take 50,000 Units by mouth once a week.       . furosemide (LASIX) 40 MG tablet TAKE ONE TABLET BY MOUTH EVERY DAY  30 tablet  3  . glucose blood test strip 1 each by Other route as needed. Use as instructed       . hydrALAZINE (APRESOLINE) 100 MG tablet TAKE ONE TABLET BY MOUTH THREE TIMES DAILY  90 tablet  5  . HYDROcodone-acetaminophen (NORCO) 5-325 MG per tablet Take 1-2 tablets by mouth every 6 (six) hours as needed for moderate pain or severe pain.  30 tablet  0  . insulin aspart (NOVOLOG) 100 UNIT/ML injection Inject 7 Units into the skin 3 (three) times daily before meals.       . insulin glargine (LANTUS) 100 UNIT/ML injection Inject 27 Units into the skin daily.       . irbesartan (AVAPRO) 300 MG tablet TAKE ONE TABLET BY MOUTH ONCE DAILY  30 tablet  1  . isosorbide mononitrate (IMDUR) 60 MG 24 hr tablet TAKE ONE TABLET BY MOUTH EVERY DAY  30 tablet  6  . latanoprost (XALATAN) 0.005 % ophthalmic solution Place 1 drop into both eyes daily.       . meloxicam (MOBIC) 15 MG tablet Take 15 mg by mouth daily.       . metoprolol succinate (TOPROL-XL) 100 MG 24 hr tablet TAKE ONE TABLET BY MOUTH EVERY DAY  30 tablet  0  . nitroGLYCERIN (NITROSTAT) 0.4 MG SL tablet Place 0.4 mg under the tongue every 5 (five) minutes as needed.        . pantoprazole (PROTONIX) 40 MG tablet Take 1 tablet (40 mg total) by mouth daily.  90 tablet  3  . spironolactone (ALDACTONE) 25 MG tablet TAKE ONE  TABLET BY MOUTH ONCE DAILY  90 tablet  1   No current facility-administered medications for this visit.    Review of Systems  Constitutional: Negative for fever, chills, weight loss, malaise/fatigue and diaphoresis.  HENT: Negative for congestion, ear discharge, ear pain, hearing loss, nosebleeds, sore throat and tinnitus.   Eyes: Negative for blurred vision, double vision, photophobia, pain, discharge and redness.  Respiratory: Negative for cough, hemoptysis, sputum production, shortness of breath, wheezing and stridor.   Cardiovascular: Negative for chest pain, palpitations, orthopnea, leg swelling and PND.       +history of CAD and 1 stent.  Gastrointestinal: Negative for heartburn, nausea, vomiting, abdominal pain, diarrhea, constipation, blood in stool and melena.  Genitourinary: Negative for dysuria, urgency, frequency, hematuria and flank pain.  Musculoskeletal: Negative for back pain, joint pain and myalgias.       +fibromyalgia  Skin: Negative for itching and rash.  Neurological: Negative for dizziness, tingling, tremors, sensory change, speech change, focal weakness, seizures, loss of consciousness, weakness and headaches.       + History of TIA  Endo/Heme/Allergies: Negative for environmental allergies and polydipsia. Does not bruise/bleed easily.       +diabetes hemoglobin A1C around 8  Psychiatric/Behavioral: Negative for depression, suicidal ideas, hallucinations, memory loss and substance abuse. The patient is not nervous/anxious and does not have insomnia.      PHYSICAL EXAMINATION: ECOG PERFORMANCE STATUS: 0  Filed Vitals:   03/05/14 1028  BP: 183/60  Pulse: 79  Temp: 98.4 F (36.9 C)  Resp: 18  Filed Weights   03/05/14 1028  Weight: 197 lb 4.8 oz (89.495 kg)    Physical Exam  Constitutional: She appears well-developed and well-nourished.  HENT:  Head: Normocephalic and atraumatic.  Nose: Nose normal.  Mouth/Throat: Oropharynx is clear and moist.   Eyes: Conjunctivae and EOM are normal. No scleral icterus.  Neck: Normal range of motion. Neck supple. No JVD present. No tracheal deviation present. No thyromegaly present.  Cardiovascular: Normal rate, regular rhythm and normal heart sounds.  Exam reveals no gallop and no friction rub.   No murmur heard. Pulmonary/Chest: Effort normal and breath sounds normal. No respiratory distress. She has no wheezes. She has no rales. Right breast exhibits no inverted nipple, no mass, no nipple discharge, no skin change and no tenderness. Left breast exhibits no inverted nipple, no mass, no nipple discharge, no skin change and no tenderness. Breasts are symmetrical. There is no breast swelling.  Abdominal: Soft. Bowel sounds are normal. She exhibits no distension. There is no tenderness. There is no rebound. No hernia.  Genitourinary: No breast bleeding.  Musculoskeletal: Normal range of motion. She exhibits no edema and no tenderness.  Lymphadenopathy:    She has no cervical adenopathy.  Neurological: She is alert. She has normal reflexes. She displays normal reflexes. No cranial nerve deficit. She exhibits normal muscle tone. Coordination normal.  Skin: Skin is warm and dry. No rash noted. No erythema. No pallor.  Psychiatric: She has a normal mood and affect. Her behavior is normal. Judgment and thought content normal.    LABORATORY DATA:  I have reviewed the data as listed Lab Results  Component Value Date   WBC 10.3 01/21/2014   HGB 12.1 01/21/2014   HCT 38.0 01/21/2014   MCV 85.2 01/21/2014   PLT 174 01/21/2014   Lab Results  Component Value Date   NA 142 01/21/2014   K 3.9 01/21/2014   CL 101 01/21/2014   CO2 26 01/21/2014    RADIOGRAPHIC STUDIES: I have personally reviewed the radiological images as listed and agreed with the findings in the report.   ASSESSMENT:   29. 68 years old female with diagnosis of ALH (Atypical Lobular Hyperplasia) seen on a Lumpectomy of left breast. This was  done for a palpable lump which was negative on mammogram and ultrasound. She had previously a right breast biopsy which was also negative for malignancy.   2. ALH by itself increases the risk for future breast carcinogenesis and increases risk of having ipsilateral or contralateral breast cancer by 5-7%.   3. By taking the Tamoxifen/Raloxifene (SERM) or Arimidex/ Aromasin (AI), we can reduce this risk by 50-65%. The absolute benefit is 3.5-4% risk reduction.  4. Patient has a personal history of cardiovascular diseases such as CAD, s/p stenting, HTN, DM, PAD, TIA, CEA(carotid endarterectomy), fibromyalgia, hyperlipedemia.  5. Tamoxifen or Raloxifene would not be advisable with her thromboembolic history. Arimidex or Aromasin can be a consideration and I gave her written information on Arimidex the prototype drug and also on [facility]. It does cause htn, arthralgias, risk for bone loss leading to osteopenia and osteoporosis, hyperlipedemia, fhot flashes, vaginal dryness and itching, vasomotor changes. After patient have a chance to review all the information, she will let us know whether she wants to take the Armidex for 5 years as a chemoprevention strategy. If not she should still continue the self breast exams, dedicated breast exam by a physician q 6 months and annual mammograms.     PLAN:   1. Information  given to patient on [facility] and Arimidex.  2. We will talk again with patient in next few weeks to finalize if she is agreeable to taking Arimidex or not. It reduces the risk of a future breast cancer in ipsilateral and contralateral breast by 60-65%.  3. It can cause side effects and patient already have some cardiovascular conditions so she wants to think about it.  4. I did order a bone density for her as she is due for it now.  5. Final decision to be made in next few weeks whether patient is taking Arimidex or not. RTC in 6 months for an exam and follow up.  All questions were answered. The  patient knows to call the clinic with any problems, questions or concerns.  I spent 30 minutes counseling the patient face to face. The total time spent in the appointment was 60 minutes and more than 50% was on counseling.    [name] Bell, MD Medical Hematologist/Oncologist [place] Pager: [phone] Office No: [phone]

## 2014-03-23 ENCOUNTER — Other Ambulatory Visit: Payer: Self-pay | Admitting: Cardiovascular Disease

## 2014-03-24 ENCOUNTER — Telehealth: Payer: Self-pay | Admitting: Hematology

## 2014-03-24 NOTE — Telephone Encounter (Signed)
cld & sch pt Dexa and spoke to pt/pt stated thinks she just had one last year. Stated she would check with OBGYN because that is who ordered it. Gave pt # to [facility] to call if she needed to r/s-pt understood appt time & date

## 2014-03-31 ENCOUNTER — Emergency Department (HOSPITAL_COMMUNITY): Payer: Medicare Other

## 2014-03-31 ENCOUNTER — Encounter: Payer: Self-pay | Admitting: Family Medicine

## 2014-03-31 ENCOUNTER — Emergency Department (HOSPITAL_COMMUNITY)
Admission: EM | Admit: 2014-03-31 | Discharge: 2014-03-31 | Disposition: A | Discharge status: Home / Self Care | Payer: Medicare Other | Attending: Emergency Medicine | Admitting: Emergency Medicine

## 2014-03-31 ENCOUNTER — Ambulatory Visit (INDEPENDENT_AMBULATORY_CARE_PROVIDER_SITE_OTHER): Payer: Medicare Other | Admitting: Family Medicine

## 2014-03-31 ENCOUNTER — Encounter (HOSPITAL_COMMUNITY): Payer: Self-pay | Admitting: Emergency Medicine

## 2014-03-31 VITALS — BP 210/80 | HR 92 | Resp 14

## 2014-03-31 DIAGNOSIS — E785 Hyperlipidemia, unspecified: Secondary | ICD-10-CM | POA: Diagnosis not present

## 2014-03-31 DIAGNOSIS — Z8659 Personal history of other mental and behavioral disorders: Secondary | ICD-10-CM | POA: Insufficient documentation

## 2014-03-31 DIAGNOSIS — Z8673 Personal history of transient ischemic attack (TIA), and cerebral infarction without residual deficits: Secondary | ICD-10-CM | POA: Diagnosis not present

## 2014-03-31 DIAGNOSIS — R6889 Other general symptoms and signs: Secondary | ICD-10-CM | POA: Diagnosis not present

## 2014-03-31 DIAGNOSIS — M79622 Pain in left upper arm: Secondary | ICD-10-CM

## 2014-03-31 DIAGNOSIS — I10 Essential (primary) hypertension: Secondary | ICD-10-CM

## 2014-03-31 DIAGNOSIS — I209 Angina pectoris, unspecified: Secondary | ICD-10-CM

## 2014-03-31 DIAGNOSIS — K219 Gastro-esophageal reflux disease without esophagitis: Secondary | ICD-10-CM | POA: Insufficient documentation

## 2014-03-31 DIAGNOSIS — E669 Obesity, unspecified: Secondary | ICD-10-CM | POA: Insufficient documentation

## 2014-03-31 DIAGNOSIS — Z7982 Long term (current) use of aspirin: Secondary | ICD-10-CM | POA: Diagnosis not present

## 2014-03-31 DIAGNOSIS — M79609 Pain in unspecified limb: Secondary | ICD-10-CM | POA: Diagnosis not present

## 2014-03-31 DIAGNOSIS — M25512 Pain in left shoulder: Secondary | ICD-10-CM

## 2014-03-31 DIAGNOSIS — E119 Type 2 diabetes mellitus without complications: Secondary | ICD-10-CM | POA: Insufficient documentation

## 2014-03-31 DIAGNOSIS — M129 Arthropathy, unspecified: Secondary | ICD-10-CM | POA: Insufficient documentation

## 2014-03-31 DIAGNOSIS — Z794 Long term (current) use of insulin: Secondary | ICD-10-CM | POA: Insufficient documentation

## 2014-03-31 DIAGNOSIS — I25119 Atherosclerotic heart disease of native coronary artery with unspecified angina pectoris: Secondary | ICD-10-CM

## 2014-03-31 DIAGNOSIS — M25519 Pain in unspecified shoulder: Secondary | ICD-10-CM | POA: Diagnosis not present

## 2014-03-31 DIAGNOSIS — Z791 Long term (current) use of non-steroidal anti-inflammatories (NSAID): Secondary | ICD-10-CM | POA: Insufficient documentation

## 2014-03-31 DIAGNOSIS — M25529 Pain in unspecified elbow: Secondary | ICD-10-CM | POA: Diagnosis not present

## 2014-03-31 DIAGNOSIS — Z87891 Personal history of nicotine dependence: Secondary | ICD-10-CM | POA: Diagnosis not present

## 2014-03-31 DIAGNOSIS — I6529 Occlusion and stenosis of unspecified carotid artery: Secondary | ICD-10-CM | POA: Diagnosis not present

## 2014-03-31 DIAGNOSIS — Z862 Personal history of diseases of the blood and blood-forming organs and certain disorders involving the immune mechanism: Secondary | ICD-10-CM | POA: Insufficient documentation

## 2014-03-31 DIAGNOSIS — Z79899 Other long term (current) drug therapy: Secondary | ICD-10-CM | POA: Insufficient documentation

## 2014-03-31 DIAGNOSIS — I2510 Atherosclerotic heart disease of native coronary artery without angina pectoris: Secondary | ICD-10-CM

## 2014-03-31 LAB — CBC
HCT: 42.7 % (ref 36.0–46.0)
HEMOGLOBIN: 14.2 g/dL (ref 12.0–15.0)
MCH: 28.3 pg (ref 26.0–34.0)
MCHC: 33.3 g/dL (ref 30.0–36.0)
MCV: 85.1 fL (ref 78.0–100.0)
Platelets: 208 10*3/uL (ref 150–400)
RBC: 5.02 MIL/uL (ref 3.87–5.11)
RDW: 16 % — ABNORMAL HIGH (ref 11.5–15.5)
WBC: 10.2 10*3/uL (ref 4.0–10.5)

## 2014-03-31 LAB — BASIC METABOLIC PANEL
Anion gap: 14 (ref 5–15)
BUN: 17 mg/dL (ref 6–23)
CO2: 26 mEq/L (ref 19–32)
Calcium: 9.9 mg/dL (ref 8.4–10.5)
Chloride: 101 mEq/L (ref 96–112)
Creatinine, Ser: 0.68 mg/dL (ref 0.50–1.10)
GFR calc Af Amer: >90 mL/min (ref >90)
GFR, EST NON AFRICAN AMERICAN: 89 mL/min — AB (ref >90)
Glucose, Bld: 207 mg/dL — ABNORMAL HIGH (ref 70–99)
Potassium: 3.8 mEq/L (ref 3.7–5.3)
Sodium: 141 mEq/L (ref 137–147)

## 2014-03-31 LAB — I-STAT CHEM 8, ED
BUN: 17 mg/dL (ref 6–23)
CREATININE: 0.7 mg/dL (ref 0.50–1.10)
Calcium, Ion: 1.09 mmol/L — ABNORMAL LOW (ref 1.13–1.30)
Chloride: 105 mEq/L (ref 96–112)
Glucose, Bld: 224 mg/dL — ABNORMAL HIGH (ref 70–99)
HCT: 46 % (ref 36.0–46.0)
Hemoglobin: 15.6 g/dL — ABNORMAL HIGH (ref 12.0–15.0)
POTASSIUM: 3.5 meq/L — AB (ref 3.7–5.3)
Sodium: 140 mEq/L (ref 137–147)
TCO2: 25 mmol/L (ref 0–100)

## 2014-03-31 LAB — I-STAT TROPONIN, ED: Troponin i, poc: 0.01 ng/mL (ref 0.00–0.08)

## 2014-03-31 MED ORDER — HYDROCODONE-ACETAMINOPHEN 5-325 MG PO TABS
2.0000 | ORAL_TABLET | Freq: Once | ORAL | Status: AC
  Administered 2014-03-31: 2 via ORAL
  Filled 2014-03-31: qty 2

## 2014-03-31 MED ORDER — LORAZEPAM 2 MG/ML IJ SOLN: 1.0000 mg | Freq: Once | INTRAMUSCULAR | Status: DC

## 2014-03-31 MED ORDER — LORAZEPAM 1 MG PO TABS
1.0000 mg | ORAL_TABLET | Freq: Once | ORAL | Status: AC
Start: 2014-03-31 — End: 2014-03-31
  Administered 2014-03-31: 1 mg via ORAL
  Filled 2014-03-31: qty 1

## 2014-03-31 NOTE — Progress Notes (Signed)
   Subjective:    Patient ID: [name], female    DOB: 1945-08-25, 68 y.o.   MRN: [id number]  HPI 68 yr old female with known CAD and a stent in place since 2010 presents with 14 hours of intermittent aching pains in the left arm and left shoulder and SOB. No chest pains. No sweats or nausea. She took a SL NTG early this am which gave her some relief. She drove herself to the clinic this morning and was followed in by a police officer who said she was swerving and driving recklessly.    Review of Systems  Constitutional: Negative.   Respiratory: Positive for chest tightness and shortness of breath. Negative for choking and wheezing.   Cardiovascular: Negative.  Negative for chest pain, palpitations and leg swelling.  Neurological: Negative.        Objective:   Physical Exam  Constitutional: She is oriented to person, place, and time.  Very anxious at first, tachypneic, holding her left upper arm, it was difficult for her to answer questions   Cardiovascular: Regular rhythm, normal heart sounds and intact distal pulses.  Exam reveals no gallop and no friction rub.   No murmur heard. Rapid rate. EKG is normal   Pulmonary/Chest: Effort normal and breath sounds normal. No respiratory distress. She has no wheezes. She has no rales.  Abdominal: Soft. Bowel sounds are normal. She exhibits no distension and no mass. There is no tenderness. There is no rebound and no guarding.  Neurological: She is alert and oriented to person, place, and time.          Assessment & Plan:  With her cardiac history, this left arm pain is worrisome for possible unstable angina. She was given a SL NTG and was placed on 4 liters of oxygen. After a few minutes she felt better, was more relaxed and the arm pain eased up. EMS was called and they will take her to [facility] ER for further evaluation and treatment.

## 2014-03-31 NOTE — ED Notes (Signed)
Phlebotomy at bedside attempting blood draw

## 2014-03-31 NOTE — ED Notes (Signed)
Left arm; started last night no exertion.  Got up this morning to see Dr. Sarajane Jews.  On her way, pt. Got pulled over gpd for speeding to drs. Office.  Dr. Sarajane Jews  Gave x ntg sl 0.4 mg and asa with no relief. Did take daily meds.

## 2014-03-31 NOTE — Progress Notes (Signed)
*  PRELIMINARY RESULTS* Vascular Ultrasound Left upper extremity venous duplex has been completed.  Preliminary findings: No evidence of DVT or superficial thrombosis.     Landry Mellow, RDMS, RVT  03/31/2014, 3:38 PM

## 2014-03-31 NOTE — ED Provider Notes (Signed)
CSN: [id number]     Arrival date & time 03/31/14  1157 History   First MD Initiated Contact with Patient 03/31/14 1158     Chief Complaint  Patient presents with  . Extremity Pain     (Consider location/radiation/quality/duration/timing/severity/associated sxs/prior Treatment) HPI Comments: Patient is a 68 year old female with an extensive past medical history including hypertension, hyperlipidemia, TIA, GERD, type 2 diabetes, PVD and CAD (PCI of RCA June 2010) who presents to the emergency department via EMS from her PCPs (Dr. Sarajane Jews) office complaining of left arm pain beginning yesterday evening while she was laying around. She made an appointment to see Dr. Sarajane Jews today, however on her way she got pulled over by the police for speeding. Once she arrived to the office, her pain worsened, located "in my arm muscle" radiating towards the back of her shoulder, constant, worse with movement. No known injury or trauma. She was given sublingual nitroglycerin and aspirin with no relief of her symptoms. She had a left lumpectomy about one month ago. She is not on any blood thinners. Denies history of blood clots. Denies fever, chills, chest pain, shortness of breath, headache, lightheadedness, dizziness, nausea or vomiting, numbness or tingling.  Patient is a 68 y.o. female presenting with extremity pain. The history is provided by the patient.  Extremity Pain    Past Medical History  Diagnosis Date  . HTN (hypertension)   . Hyperlipidemia   . TIA (transient ischemic attack)     also Hx of it.   Marland Kitchen GERD (gastroesophageal reflux disease)   . Headache(784.0)   . Anemia, unspecified   . Edema   . Depressive disorder, not elsewhere classified   . Low back pain   . Obesity   . Duodenal nodule   . Hemorrhoids   . Arthritis   . Glaucoma     narrow angle, sees Dr. Herbert Deaner   . Type II or unspecified type diabetes mellitus without mention of complication, not stated as uncontrolled     sees Dr. Carrolyn Meiers   . Gastric polyps     COLON  . Allergy   . CAD (coronary artery disease)     sees. Dr. Burt Knack. s/p cath with PCI of RCA (xience des) June 2010. Pt also with LAD and D2 dzs-NL FFR in both areas med Rx  . Stroke   . Peripheral vascular disease    Past Surgical History  Procedure Laterality Date  . Bilateral carpal tunnel release    . Colonoscopy  05-29-11    per Dr. Henrene Pastor, benign polyps, repeat in 5 yrs    . Esophagogastroduodenoscopy  02-08-06    per Dr. Henrene Pastor, gastritis   . Glaucoma surgery      with laser, per Dr. Henrene Pastor   . Carotid endarterectomy  march 2012    per Dr. Morton Amy  . Abdominal hysterectomy  age 11    TAH and USO  Leiomyomata  . Back surgery  2008    lumb fusion  . Cholecystectomy    . Dilation and curettage of uterus    . Breast biopsy Left 01/26/2014    Procedure: EXCISION LEFT BREAST MASS;  Surgeon: Adin Hector, MD;  Location: [facility];  Service: General;  Laterality: Left;   Family History  Problem Relation Age of Onset  . Coronary artery disease Neg Hx     premature CAD  . Colon cancer Neg Hx   . Esophageal cancer Neg Hx   . Stomach cancer  Neg Hx   . Hypertension Mother   . Hypertension Sister   . Hyperlipidemia Sister   . Hypertension Brother   . Cancer Brother     PROSTATE/LUNG  . Diabetes Brother   . Heart disease Brother     Before age 30 - Bypass  . Varicose Veins Brother   . Cancer Father     Prostate and pancreatic    History  Substance Use Topics  . Smoking status: Former Smoker    Quit date: 01/22/1967  . Smokeless tobacco: Never Used     Comment: non smoker  . Alcohol Use: No   OB History   Grav Para Term Preterm Abortions TAB SAB Ect Mult Living   2 1 1  1  1   1      Review of Systems  Musculoskeletal:       + L arm pain.  All other systems reviewed and are negative.     Allergies  Review of patient's allergies indicates no known allergies.  Home Medications   Prior to Admission  medications   Medication Sig Start Date End Date Taking? Authorizing Provider  aspirin 81 MG tablet Take 81 mg by mouth daily.    Yes Historical Provider, MD  diclofenac sodium (VOLTAREN) 1 % GEL Apply 2 g topically 4 (four) times daily as needed (for pain).    Yes Historical Provider, MD  ergocalciferol (VITAMIN D2) 50000 UNITS capsule Take 50,000 Units by mouth once a week. On Saturday.   Yes Historical Provider, MD  furosemide (LASIX) 40 MG tablet Take 40 mg by mouth daily.   Yes Historical Provider, MD  glucose blood test strip 1 each by Other route See admin instructions. Check blood sugar once daily.   Yes Historical Provider, MD  hydrALAZINE (APRESOLINE) 100 MG tablet Take 100 mg by mouth 3 (three) times daily.   Yes Historical Provider, MD  HYDROcodone-acetaminophen (NORCO) 5-325 MG per tablet Take 1-2 tablets by mouth every 6 (six) hours as needed for moderate pain or severe pain. 01/26/14  Yes Fanny Skates, MD  insulin aspart (NOVOLOG) 100 UNIT/ML injection Inject 7 Units into the skin 3 (three) times daily before meals.    Yes Historical Provider, MD  insulin glargine (LANTUS) 100 UNIT/ML injection Inject 27 Units into the skin daily.    Yes Historical Provider, MD  irbesartan (AVAPRO) 300 MG tablet Take 300 mg by mouth daily.   Yes Historical Provider, MD  isosorbide mononitrate (IMDUR) 60 MG 24 hr tablet Take 60 mg by mouth daily.   Yes Historical Provider, MD  latanoprost (XALATAN) 0.005 % ophthalmic solution Place 1 drop into both eyes daily.  09/26/11  Yes Historical Provider, MD  meloxicam (MOBIC) 15 MG tablet Take 15 mg by mouth daily.  09/03/11  Yes Historical Provider, MD  metoprolol succinate (TOPROL-XL) 100 MG 24 hr tablet Take 100 mg by mouth daily. Take with or immediately following a meal.   Yes Historical Provider, MD  nitroGLYCERIN (NITROSTAT) 0.4 MG SL tablet Place 0.4 mg under the tongue every 5 (five) minutes as needed for chest pain.    Yes Historical Provider, MD   pantoprazole (PROTONIX) 40 MG tablet Take 1 tablet (40 mg total) by mouth daily. 01/28/14  Yes Laurey Morale, MD  rosuvastatin (CRESTOR) 40 MG tablet Take 40 mg by mouth daily.   Yes Historical Provider, MD  spironolactone (ALDACTONE) 25 MG tablet Take 25 mg by mouth daily.   Yes Historical Provider, MD  BP 176/71  Pulse 69  Temp(Src) 98.2 F (36.8 C) (Oral)  Resp 15  SpO2 98% Physical Exam  Nursing note and vitals reviewed. Constitutional: She is oriented to person, place, and time. She appears well-developed and well-nourished. No distress.  HENT:  Head: Normocephalic and atraumatic.  Mouth/Throat: Oropharynx is clear and moist.  Eyes: Conjunctivae and EOM are normal. Pupils are equal, round, and reactive to light.  Neck: Normal range of motion. Neck supple. No JVD present.  Cardiovascular: Normal rate, regular rhythm, normal heart sounds, intact distal pulses and normal pulses.   No extremity edema.  Pulmonary/Chest: Effort normal and breath sounds normal. No respiratory distress.  Abdominal: Soft. Bowel sounds are normal. There is no tenderness.  Musculoskeletal: Normal range of motion. She exhibits no edema.  TTP over left biceps muscle and scapula area. Full L shoulder ROM, pain noted. No swelling, bruising or deformity.  Neurological: She is alert and oriented to person, place, and time. She has normal strength. No sensory deficit.  Speech fluent, goal oriented. Moves limbs without ataxia. Equal grip strength bilateral.  Skin: Skin is warm and dry. She is not diaphoretic.  Psychiatric: She has a normal mood and affect. Her behavior is normal.    ED Course  Procedures (including critical care time) Labs Review Labs Reviewed  CBC - Abnormal; Notable for the following:    RDW 16.0 (*)    All other components within normal limits  BASIC METABOLIC PANEL - Abnormal; Notable for the following:    Glucose, Bld 207 (*)    GFR calc non Af Amer 89 (*)    All other components  within normal limits  I-STAT CHEM 8, ED - Abnormal; Notable for the following:    Potassium 3.5 (*)    Glucose, Bld 224 (*)    Calcium, Ion 1.09 (*)    Hemoglobin 15.6 (*)    All other components within normal limits  I-STAT TROPOININ, ED    Imaging Review Dg Chest 2 View  03/31/2014   CLINICAL DATA:  EXTREMITY PAIN left shoulder and arm pain  EXAM: CHEST - 2 VIEW  COMPARISON:  09/12/2010  FINDINGS: Persistent low lung volumes. This accentuates the heart size and vascular markings. No definite focal pneumonia, collapse or consolidation. No edema, effusion or pneumothorax. Trachea midline. Atherosclerosis noted of the thoracic aorta. Degenerative changes of the spine. Prior cholecystectomy noted.  IMPRESSION: Stable low lung volumes.  No superimposed acute process   Electronically Signed   By: Daryll Brod M.D.   On: 03/31/2014 13:20     EKG Interpretation   Date/Time:  Wednesday March 31 2014 12:06:26 EDT Ventricular Rate:  76 PR Interval:  170 QRS Duration: 97 QT Interval:  426 QTC Calculation: 479 R Axis:   34 Text Interpretation:  Sinus rhythm Probable left atrial enlargement  Nonspecific ST and T wave abnormality No significant change since last  tracing Abnormal ekg Confirmed by BEATON  MD, ROBERT ([id number]) on 03/31/2014  1:01:01 PM      MDM   Final diagnoses:  Left upper arm pain   Pt presenting with left arm pain. She is non-toxic appearing and in NAD. AFVSS. Hypertensive on arrival. No associated symptoms. Surgery about 1 month ago. Pain reproducible with palpation and movement. Concern for DVT. UE venous duplex negative for DVT. Most likely musculoskeletal pain. Doubt cardiac. Low suspicion- no associated symptoms and pain reproducible. Anxious from getting pulled over. Ice/heat. F/u with PCP. Pt still at vascular lab, waiting for  transport back to ED. Pt discussed with oncoming provider at shift change, Dr. Henry Russel who will discuss discharge with pt once she returns  from study.  Case discussed with attending Dr. Audie Pinto who agrees with plan of care.    Illene Labrador, PA-C 04/01/14 [place], PA-C 04/01/14 1345

## 2014-03-31 NOTE — Discharge Instructions (Signed)

## 2014-04-01 ENCOUNTER — Ambulatory Visit (HOSPITAL_COMMUNITY): Admission: RE | Admit: 2014-04-01 | Payer: Medicare Other | Source: Ambulatory Visit

## 2014-04-01 NOTE — ED Provider Notes (Signed)
Medical screening examination/treatment/procedure(s) were performed by non-physician practitioner and as supervising physician I was immediately available for consultation/collaboration.   Dot Lanes, MD 04/01/14 [phone]

## 2014-04-02 ENCOUNTER — Other Ambulatory Visit: Payer: Self-pay | Admitting: Sports Medicine

## 2014-04-02 DIAGNOSIS — M5412 Radiculopathy, cervical region: Secondary | ICD-10-CM | POA: Diagnosis not present

## 2014-04-02 DIAGNOSIS — M25519 Pain in unspecified shoulder: Secondary | ICD-10-CM | POA: Diagnosis not present

## 2014-04-02 DIAGNOSIS — M542 Cervicalgia: Secondary | ICD-10-CM

## 2014-04-04 ENCOUNTER — Ambulatory Visit
Admission: RE | Admit: 2014-04-04 | Discharge: 2014-04-04 | Disposition: A | Discharge status: Home / Self Care | Payer: Medicare Other | Source: Ambulatory Visit | Attending: Sports Medicine | Admitting: Sports Medicine

## 2014-04-04 DIAGNOSIS — M542 Cervicalgia: Secondary | ICD-10-CM

## 2014-04-04 DIAGNOSIS — M5020 Other cervical disc displacement, unspecified cervical region: Secondary | ICD-10-CM | POA: Diagnosis not present

## 2014-04-04 DIAGNOSIS — M4802 Spinal stenosis, cervical region: Secondary | ICD-10-CM | POA: Diagnosis not present

## 2014-04-06 DIAGNOSIS — M542 Cervicalgia: Secondary | ICD-10-CM | POA: Diagnosis not present

## 2014-04-07 ENCOUNTER — Telehealth: Payer: Self-pay | Admitting: Cardiovascular Disease

## 2014-04-07 NOTE — Telephone Encounter (Signed)
I spoke with [name] and made her aware that the pt is okay for injection and can hold Aspirin per Dr Burt Knack. I will fax this note to [phone].

## 2014-04-07 NOTE — Telephone Encounter (Signed)
This is fine.-thx 

## 2014-04-07 NOTE — Telephone Encounter (Signed)
°  Colletta Maryland w/[facility] would like to speak with you regarding patient.  They need approval for patient to stop blood thinner for injections. They need this information by the end of today. She did receive Lauren's fax but would like to discuss further. Please over head page her when you call back.

## 2014-04-07 NOTE — Telephone Encounter (Signed)
Request received from Dr French Ana and Dr Ron Agee to obtain clearance for cervical epidural injection. They need clearance for the pt to Stop Aspirin 7 days prior to injection. A response is needed today since they would like to schedule injection next week.  I will forward this message to Dr Burt Knack.

## 2014-04-08 DIAGNOSIS — M542 Cervicalgia: Secondary | ICD-10-CM | POA: Diagnosis not present

## 2014-04-08 DIAGNOSIS — M5412 Radiculopathy, cervical region: Secondary | ICD-10-CM | POA: Diagnosis not present

## 2014-04-08 DIAGNOSIS — M5020 Other cervical disc displacement, unspecified cervical region: Secondary | ICD-10-CM | POA: Diagnosis not present

## 2014-04-09 DIAGNOSIS — Z23 Encounter for immunization: Secondary | ICD-10-CM | POA: Diagnosis not present

## 2014-04-13 ENCOUNTER — Other Ambulatory Visit (HOSPITAL_COMMUNITY): Payer: Self-pay | Admitting: Neurosurgery

## 2014-04-13 DIAGNOSIS — G5602 Carpal tunnel syndrome, left upper limb: Secondary | ICD-10-CM | POA: Diagnosis not present

## 2014-04-13 DIAGNOSIS — Z6832 Body mass index (BMI) 32.0-32.9, adult: Secondary | ICD-10-CM | POA: Diagnosis not present

## 2014-04-13 DIAGNOSIS — M4802 Spinal stenosis, cervical region: Secondary | ICD-10-CM | POA: Diagnosis not present

## 2014-04-13 DIAGNOSIS — G5622 Lesion of ulnar nerve, left upper limb: Secondary | ICD-10-CM | POA: Diagnosis not present

## 2014-04-13 DIAGNOSIS — I10 Essential (primary) hypertension: Secondary | ICD-10-CM | POA: Diagnosis not present

## 2014-04-15 ENCOUNTER — Other Ambulatory Visit (HOSPITAL_COMMUNITY): Payer: Self-pay | Admitting: Neurosurgery

## 2014-04-19 ENCOUNTER — Other Ambulatory Visit: Payer: Self-pay | Admitting: Nurse Practitioner

## 2014-04-20 ENCOUNTER — Inpatient Hospital Stay (HOSPITAL_COMMUNITY): Admission: RE | Admit: 2014-04-20 | Payer: Medicare Other | Source: Ambulatory Visit

## 2014-04-29 ENCOUNTER — Telehealth: Payer: Self-pay | Admitting: Family Medicine

## 2014-04-29 NOTE — Telephone Encounter (Signed)
Pt called to say that she need a note to take to court about a speeding ticket.Dr Sarajane Jews told her he would write her a  letter to take to court. She said she received a speeding ticket on the way to the doctor's office cause she thought she was having a heart attack. Pt said police officer followed her into the doctors office.

## 2014-04-30 ENCOUNTER — Encounter (HOSPITAL_COMMUNITY): Payer: Self-pay | Admitting: Pharmacy Technician

## 2014-05-03 NOTE — Telephone Encounter (Signed)
lmovm about Dr Sarajane Jews 's decision

## 2014-05-03 NOTE — Telephone Encounter (Signed)
NO I cannot write a letter to excuse her for speeding. That was her decision.

## 2014-05-04 DIAGNOSIS — E785 Hyperlipidemia, unspecified: Secondary | ICD-10-CM | POA: Diagnosis not present

## 2014-05-04 DIAGNOSIS — N62 Hypertrophy of breast: Secondary | ICD-10-CM | POA: Diagnosis not present

## 2014-05-04 DIAGNOSIS — E1159 Type 2 diabetes mellitus with other circulatory complications: Secondary | ICD-10-CM | POA: Diagnosis not present

## 2014-05-04 DIAGNOSIS — E559 Vitamin D deficiency, unspecified: Secondary | ICD-10-CM | POA: Diagnosis not present

## 2014-05-04 DIAGNOSIS — Z23 Encounter for immunization: Secondary | ICD-10-CM | POA: Diagnosis not present

## 2014-05-04 DIAGNOSIS — I6523 Occlusion and stenosis of bilateral carotid arteries: Secondary | ICD-10-CM | POA: Diagnosis not present

## 2014-05-04 DIAGNOSIS — M5030 Other cervical disc degeneration, unspecified cervical region: Secondary | ICD-10-CM | POA: Diagnosis not present

## 2014-05-04 DIAGNOSIS — F329 Major depressive disorder, single episode, unspecified: Secondary | ICD-10-CM | POA: Diagnosis not present

## 2014-05-05 NOTE — Pre-Procedure Instructions (Signed)
[name]  05/05/2014   Your procedure is scheduled on:   Nov. 6th, Friday    Report to [facility] Admitting at 6:00 AM.  Call this number if you have problems the morning of surgery: [phone]   Remember:   Do not eat food or drink liquids after midnight Thursday.    Take these medicines the morning of surgery with A SIP OF WATER: Hydrocodone, Metoprolol, Protonix   Do not wear jewelry, make-up or nail polish.  Do not wear lotions, powders, or perfumes. You may NOT wear deodorant the day of surgery.  Do not shave 48 hours prior to surgery.    Do not bring valuables to the hospital.  [facility] is not responsible for any belongings or valuables.               Contacts, dentures or bridgework may not be worn into surgery.  Leave suitcase in the car. After surgery it may be brought to your room.  For patients admitted to the hospital, discharge time is determined by your treatment team.    Name and phone number of your driver:    Special Instructions: "Preparing for Surgery" instruction sheet.   Please read over the following fact sheets that you were given: Pain Booklet, MRSA Information and Surgical Site Infection Prevention

## 2014-05-06 ENCOUNTER — Encounter (HOSPITAL_COMMUNITY): Payer: Self-pay

## 2014-05-06 ENCOUNTER — Encounter (HOSPITAL_COMMUNITY)
Admission: RE | Admit: 2014-05-06 | Discharge: 2014-05-06 | Disposition: A | Discharge status: Home / Self Care | Payer: Medicare Other | Source: Ambulatory Visit | Attending: Neurosurgery | Admitting: Neurosurgery

## 2014-05-06 ENCOUNTER — Other Ambulatory Visit (HOSPITAL_COMMUNITY): Payer: Self-pay | Admitting: Neurosurgery

## 2014-05-06 DIAGNOSIS — I10 Essential (primary) hypertension: Secondary | ICD-10-CM | POA: Diagnosis not present

## 2014-05-06 DIAGNOSIS — Z981 Arthrodesis status: Secondary | ICD-10-CM | POA: Insufficient documentation

## 2014-05-06 DIAGNOSIS — K219 Gastro-esophageal reflux disease without esophagitis: Secondary | ICD-10-CM | POA: Insufficient documentation

## 2014-05-06 DIAGNOSIS — N62 Hypertrophy of breast: Secondary | ICD-10-CM | POA: Insufficient documentation

## 2014-05-06 DIAGNOSIS — Z9049 Acquired absence of other specified parts of digestive tract: Secondary | ICD-10-CM | POA: Insufficient documentation

## 2014-05-06 DIAGNOSIS — F329 Major depressive disorder, single episode, unspecified: Secondary | ICD-10-CM | POA: Insufficient documentation

## 2014-05-06 DIAGNOSIS — Z87891 Personal history of nicotine dependence: Secondary | ICD-10-CM | POA: Diagnosis not present

## 2014-05-06 DIAGNOSIS — Z01818 Encounter for other preprocedural examination: Secondary | ICD-10-CM | POA: Diagnosis not present

## 2014-05-06 DIAGNOSIS — I739 Peripheral vascular disease, unspecified: Secondary | ICD-10-CM | POA: Insufficient documentation

## 2014-05-06 DIAGNOSIS — Z8673 Personal history of transient ischemic attack (TIA), and cerebral infarction without residual deficits: Secondary | ICD-10-CM | POA: Insufficient documentation

## 2014-05-06 DIAGNOSIS — D649 Anemia, unspecified: Secondary | ICD-10-CM | POA: Diagnosis not present

## 2014-05-06 DIAGNOSIS — H409 Unspecified glaucoma: Secondary | ICD-10-CM | POA: Diagnosis not present

## 2014-05-06 DIAGNOSIS — E119 Type 2 diabetes mellitus without complications: Secondary | ICD-10-CM | POA: Insufficient documentation

## 2014-05-06 DIAGNOSIS — Z90710 Acquired absence of both cervix and uterus: Secondary | ICD-10-CM | POA: Diagnosis not present

## 2014-05-06 DIAGNOSIS — I2510 Atherosclerotic heart disease of native coronary artery without angina pectoris: Secondary | ICD-10-CM | POA: Insufficient documentation

## 2014-05-06 DIAGNOSIS — E785 Hyperlipidemia, unspecified: Secondary | ICD-10-CM | POA: Insufficient documentation

## 2014-05-06 LAB — CBC
HCT: 37 % (ref 36.0–46.0)
HEMOGLOBIN: 12.1 g/dL (ref 12.0–15.0)
MCH: 28.1 pg (ref 26.0–34.0)
MCHC: 32.7 g/dL (ref 30.0–36.0)
MCV: 85.8 fL (ref 78.0–100.0)
Platelets: 187 10*3/uL (ref 150–400)
RBC: 4.31 MIL/uL (ref 3.87–5.11)
RDW: 15.7 % — ABNORMAL HIGH (ref 11.5–15.5)
WBC: 7.4 10*3/uL (ref 4.0–10.5)

## 2014-05-06 LAB — COMPREHENSIVE METABOLIC PANEL
ALK PHOS: 75 U/L (ref 39–117)
ALT: 26 U/L (ref 0–35)
AST: 26 U/L (ref 0–37)
Albumin: 3.5 g/dL (ref 3.5–5.2)
Anion gap: 11 (ref 5–15)
BUN: 12 mg/dL (ref 6–23)
CALCIUM: 9.2 mg/dL (ref 8.4–10.5)
CO2: 27 mEq/L (ref 19–32)
Chloride: 106 mEq/L (ref 96–112)
Creatinine, Ser: 0.7 mg/dL (ref 0.50–1.10)
GFR calc non Af Amer: 88 mL/min — ABNORMAL LOW (ref >90)
GLUCOSE: 175 mg/dL — AB (ref 70–99)
POTASSIUM: 4 meq/L (ref 3.7–5.3)
SODIUM: 144 meq/L (ref 137–147)
TOTAL PROTEIN: 6.5 g/dL (ref 6.0–8.3)
Total Bilirubin: 0.3 mg/dL (ref 0.3–1.2)

## 2014-05-06 NOTE — Progress Notes (Signed)
Cardio is Dr. Burt Knack, who she last saw ? 4-5 mths ago.  Cardiac stents were placed in 2010, and she has no current problems or concerns.

## 2014-05-07 NOTE — Progress Notes (Signed)
Anesthesia Chart Review:  Patient is a 68 year old female scheduled for left CTR and left ulnar nerve decompression on 05/14/14 by Dr. Christella Noa.    History includes former smoker, CAD s/p RCA stent '10, HTN, HLD, TIA, GERD, anemia, depression, glaucoma, PVD s/p right CEA '12, DM2, cholecystectomy, hysterectomy, lumbar fusion '08, left breast mass excision (atypical lobular hyperplasia) 01/26/14.  PCP is Dr. Alysia Penna. Cardiologist is Dr. Burt Knack, last visit 01/15/14. Her cleared her for July breast surgery at that time without further testing as she was asymptomatic with ability to achieve > 4 METS.  One year cardiology follow-up recommended. She denied any "current problems or concerns" at her PAT visit yesterday.   EKG on 03/31/14: SR, probable LAE, non-specific ST/T wave abnormality.  No significant change since last tracing.  Echo on 12/13/08: Normal LV size and systolic function with mild LV hypertrophy. LVEF 60-65%. No significant valvular dysfunction (aortic sclerosis).  Cardiac cath 12/13/08: Two-vessel coronary artery disease (95% mid RCA; 30% mid AV groove CX; 60% 50% mid LAD, 30% D1?, ostial D2 followed by 80% mid OM2). 2. Normal left ventricular (LV) function. 3. s/p PTCA/stent RCA; fractional flow reserve LAD and D2 (did not meet criteria for PCI) 12/15/08.  Carotid duplex 03/12/14: No significant stenosis of bilateral external common carotid arteries. Patent right carotid endarterectomy site with no right internal carotid artery stenosis. Doppler velocities suggest less than 40% stenosis of the left proximal ICA.  CXR on 03/31/14: Stable low lung volumes. No superimposed acute process.  Preoperative labs noted.       She tolerated breast surgery under GA/LMA just over three months ago.  Further evaluation by her assigned anesthesiologist on the day of surgery, but if no new CV symptoms then I would anticipate that she could proceed with this procedure.    George Hugh [facility] Short Stay  Center/Anesthesiology Phone [phone] 05/07/2014 11:01 AM

## 2014-05-09 DIAGNOSIS — A609 Anogenital herpesviral infection, unspecified: Secondary | ICD-10-CM

## 2014-05-09 HISTORY — DX: Anogenital herpesviral infection, unspecified: A60.9

## 2014-05-10 ENCOUNTER — Encounter (HOSPITAL_COMMUNITY): Payer: Self-pay

## 2014-05-11 ENCOUNTER — Ambulatory Visit (INDEPENDENT_AMBULATORY_CARE_PROVIDER_SITE_OTHER): Payer: Medicare Other | Admitting: Gynecology

## 2014-05-11 ENCOUNTER — Encounter: Payer: Self-pay | Admitting: Gynecology

## 2014-05-11 DIAGNOSIS — I6529 Occlusion and stenosis of unspecified carotid artery: Secondary | ICD-10-CM

## 2014-05-11 DIAGNOSIS — N907 Vulvar cyst: Secondary | ICD-10-CM | POA: Diagnosis not present

## 2014-05-11 DIAGNOSIS — N766 Ulceration of vulva: Secondary | ICD-10-CM | POA: Diagnosis not present

## 2014-05-11 DIAGNOSIS — N898 Other specified noninflammatory disorders of vagina: Secondary | ICD-10-CM

## 2014-05-11 LAB — WET PREP FOR TRICH, YEAST, CLUE
CLUE CELLS WET PREP: NONE SEEN
Trich, Wet Prep: NONE SEEN

## 2014-05-11 MED ORDER — TERCONAZOLE 0.8 % VA CREA: 1.0000 | TOPICAL_CREAM | Freq: Every day | VAGINAL | Status: DC

## 2014-05-11 NOTE — Patient Instructions (Signed)
Use the vaginal cream nightly for 3 nights. Office will call you with the viral culture in about a week. Follow up if symptoms persist, worsen or recur.

## 2014-05-11 NOTE — Addendum Note (Signed)
Addended by: Nelva Nay on: 05/11/2014 04:11 PM   Modules accepted: Orders

## 2014-05-11 NOTE — Progress Notes (Signed)
CLEONA DOUBLEDAY Dec 02, 1945 [id number]        68 y.o.  G2P1011 presents with several days of vulvar irritation. She's been putting Neosporin on the area but it seems to persist. She's having itching little bit of discharge.  Past medical history,surgical history, problem list, medications, allergies, family history and social history were all reviewed and documented in the EPIC chart.  Directed ROS with pertinent positives and negatives documented in the history of present illness/assessment and plan.  Exam: Kim assistant General appearance:  Normal External BUS vagina with several small sebaceous cysts as diagrammed below. Small punctate ulcer below posterior fourchette on perineal body suspicious for HSV. Vagina with copious white discharge.  Bimanual without masses or tenderness Physical Exam  Genitourinary:         Assessment/Plan:  68 y.o. G2P1011 with: 1. White discharge and wet prep consistent with yeast. Will treat with Terazol 3 day cream. Follow up if symptoms persist, worsen or recur. 2. Punctate ulcer. Viral PCR done. Discussed possible HSV with the patient. Will follow up for results in a week. Questionable result of irritation from the discharge also possible. 3. 3 sebaceous cysts on the vulva classic in appearance. Not bothersome to the patient. Continue to monitor present.     [name], 3:56 PM 05/11/2014

## 2014-05-13 ENCOUNTER — Encounter: Payer: Self-pay | Admitting: Gynecology

## 2014-05-13 ENCOUNTER — Other Ambulatory Visit: Payer: Self-pay | Admitting: Cardiovascular Disease

## 2014-05-13 DIAGNOSIS — Z6832 Body mass index (BMI) 32.0-32.9, adult: Secondary | ICD-10-CM | POA: Diagnosis not present

## 2014-05-13 DIAGNOSIS — I10 Essential (primary) hypertension: Secondary | ICD-10-CM | POA: Diagnosis not present

## 2014-05-13 LAB — HERPES SIMPLEX VIRUS CULTURE: Organism ID, Bacteria: DETECTED

## 2014-05-13 MED ORDER — CEFAZOLIN SODIUM-DEXTROSE 2-3 GM-% IV SOLR
2.0000 g | INTRAVENOUS | Status: AC
  Administered 2014-05-14: 2 g via INTRAVENOUS
  Filled 2014-05-13: qty 50

## 2014-05-14 ENCOUNTER — Encounter (HOSPITAL_COMMUNITY): Payer: Self-pay | Admitting: *Deleted

## 2014-05-14 ENCOUNTER — Inpatient Hospital Stay (HOSPITAL_COMMUNITY): Payer: Medicare Other | Admitting: Vascular Surgery

## 2014-05-14 ENCOUNTER — Inpatient Hospital Stay (HOSPITAL_COMMUNITY): Payer: Medicare Other | Admitting: Certified Registered Nurse Anesthetist

## 2014-05-14 ENCOUNTER — Encounter (HOSPITAL_COMMUNITY)
Admission: RE | Disposition: A | Discharge status: Home / Self Care | Payer: Self-pay | Source: Ambulatory Visit | Attending: Neurosurgery

## 2014-05-14 ENCOUNTER — Observation Stay (HOSPITAL_COMMUNITY)
Admission: RE | Admit: 2014-05-14 | Discharge: 2014-05-14 | Disposition: A | Discharge status: Home / Self Care | Payer: Medicare Other | Source: Ambulatory Visit | Attending: Neurosurgery | Admitting: Neurosurgery

## 2014-05-14 DIAGNOSIS — Z7982 Long term (current) use of aspirin: Secondary | ICD-10-CM | POA: Diagnosis not present

## 2014-05-14 DIAGNOSIS — K219 Gastro-esophageal reflux disease without esophagitis: Secondary | ICD-10-CM | POA: Insufficient documentation

## 2014-05-14 DIAGNOSIS — G5622 Lesion of ulnar nerve, left upper limb: Secondary | ICD-10-CM | POA: Diagnosis not present

## 2014-05-14 DIAGNOSIS — G5602 Carpal tunnel syndrome, left upper limb: Secondary | ICD-10-CM | POA: Insufficient documentation

## 2014-05-14 DIAGNOSIS — Z87891 Personal history of nicotine dependence: Secondary | ICD-10-CM | POA: Insufficient documentation

## 2014-05-14 DIAGNOSIS — Z791 Long term (current) use of non-steroidal anti-inflammatories (NSAID): Secondary | ICD-10-CM | POA: Insufficient documentation

## 2014-05-14 DIAGNOSIS — Z79891 Long term (current) use of opiate analgesic: Secondary | ICD-10-CM | POA: Diagnosis not present

## 2014-05-14 DIAGNOSIS — Z79899 Other long term (current) drug therapy: Secondary | ICD-10-CM | POA: Insufficient documentation

## 2014-05-14 DIAGNOSIS — Z794 Long term (current) use of insulin: Secondary | ICD-10-CM | POA: Diagnosis not present

## 2014-05-14 DIAGNOSIS — I10 Essential (primary) hypertension: Secondary | ICD-10-CM | POA: Insufficient documentation

## 2014-05-14 DIAGNOSIS — J45909 Unspecified asthma, uncomplicated: Secondary | ICD-10-CM | POA: Diagnosis not present

## 2014-05-14 DIAGNOSIS — G5600 Carpal tunnel syndrome, unspecified upper limb: Secondary | ICD-10-CM | POA: Diagnosis present

## 2014-05-14 DIAGNOSIS — F329 Major depressive disorder, single episode, unspecified: Secondary | ICD-10-CM | POA: Diagnosis not present

## 2014-05-14 DIAGNOSIS — E119 Type 2 diabetes mellitus without complications: Secondary | ICD-10-CM | POA: Diagnosis not present

## 2014-05-14 HISTORY — PX: ULNAR NERVE TRANSPOSITION: SHX2595

## 2014-05-14 HISTORY — PX: CARPAL TUNNEL RELEASE: SHX101

## 2014-05-14 LAB — GLUCOSE, CAPILLARY
GLUCOSE-CAPILLARY: 180 mg/dL — AB (ref 70–99)
Glucose-Capillary: 164 mg/dL — ABNORMAL HIGH (ref 70–99)
Glucose-Capillary: 127 mg/dL — ABNORMAL HIGH (ref 70–99)

## 2014-05-14 SURGERY — CARPAL TUNNEL RELEASE
Anesthesia: General | Site: Arm Upper | Laterality: Left

## 2014-05-14 MED ORDER — LABETALOL HCL 5 MG/ML IV SOLN
INTRAVENOUS | Status: DC | PRN
  Administered 2014-05-14 (×2): 5 mg via INTRAVENOUS

## 2014-05-14 MED ORDER — MIDAZOLAM HCL 5 MG/5ML IJ SOLN
INTRAMUSCULAR | Status: DC | PRN
  Administered 2014-05-14: 2 mg via INTRAVENOUS

## 2014-05-14 MED ORDER — SUCCINYLCHOLINE CHLORIDE 20 MG/ML IJ SOLN
INTRAMUSCULAR | Status: AC
  Filled 2014-05-14: qty 1

## 2014-05-14 MED ORDER — METOPROLOL SUCCINATE ER 100 MG PO TB24
100.0000 mg | ORAL_TABLET | Freq: Once | ORAL | Status: AC
  Administered 2014-05-14: 100 mg via ORAL
  Filled 2014-05-14: qty 1

## 2014-05-14 MED ORDER — ROCURONIUM BROMIDE 50 MG/5ML IV SOLN
INTRAVENOUS | Status: AC
  Filled 2014-05-14: qty 1

## 2014-05-14 MED ORDER — NEOSTIGMINE METHYLSULFATE 10 MG/10ML IV SOLN
INTRAVENOUS | Status: AC
Start: 2014-05-14 — End: 2014-05-14
  Filled 2014-05-14: qty 1

## 2014-05-14 MED ORDER — SODIUM CHLORIDE 0.9 % IJ SOLN
INTRAMUSCULAR | Status: AC
  Filled 2014-05-14: qty 10

## 2014-05-14 MED ORDER — PROPOFOL 10 MG/ML IV BOLUS
INTRAVENOUS | Status: AC
  Filled 2014-05-14: qty 20

## 2014-05-14 MED ORDER — PROMETHAZINE HCL 25 MG/ML IJ SOLN: 6.2500 mg | INTRAMUSCULAR | Status: DC | PRN

## 2014-05-14 MED ORDER — FENTANYL CITRATE 0.05 MG/ML IJ SOLN
INTRAMUSCULAR | Status: DC | PRN
  Administered 2014-05-14: 100 ug via INTRAVENOUS

## 2014-05-14 MED ORDER — LIDOCAINE-EPINEPHRINE 0.5 %-1:200000 IJ SOLN
INTRAMUSCULAR | Status: DC | PRN
  Administered 2014-05-14: 13 mL

## 2014-05-14 MED ORDER — PROPOFOL 10 MG/ML IV BOLUS
INTRAVENOUS | Status: DC | PRN
  Administered 2014-05-14: 170 mg via INTRAVENOUS

## 2014-05-14 MED ORDER — LACTATED RINGERS IV SOLN
INTRAVENOUS | Status: DC | PRN
  Administered 2014-05-14 (×2): via INTRAVENOUS

## 2014-05-14 MED ORDER — EPHEDRINE SULFATE 50 MG/ML IJ SOLN
INTRAMUSCULAR | Status: AC
  Filled 2014-05-14: qty 1

## 2014-05-14 MED ORDER — ONDANSETRON HCL 4 MG/2ML IJ SOLN
INTRAMUSCULAR | Status: AC
  Filled 2014-05-14: qty 2

## 2014-05-14 MED ORDER — FENTANYL CITRATE 0.05 MG/ML IJ SOLN: 25.0000 ug | INTRAMUSCULAR | Status: DC | PRN

## 2014-05-14 MED ORDER — GLYCOPYRROLATE 0.2 MG/ML IJ SOLN
INTRAMUSCULAR | Status: AC
  Filled 2014-05-14: qty 4

## 2014-05-14 MED ORDER — MIDAZOLAM HCL 2 MG/2ML IJ SOLN
INTRAMUSCULAR | Status: AC
  Filled 2014-05-14: qty 2

## 2014-05-14 MED ORDER — MEPERIDINE HCL 25 MG/ML IJ SOLN: 6.2500 mg | INTRAMUSCULAR | Status: DC | PRN

## 2014-05-14 MED ORDER — FENTANYL CITRATE 0.05 MG/ML IJ SOLN
INTRAMUSCULAR | Status: AC
  Filled 2014-05-14: qty 5

## 2014-05-14 MED ORDER — EPHEDRINE SULFATE 50 MG/ML IJ SOLN
INTRAMUSCULAR | Status: DC | PRN
  Administered 2014-05-14 (×5): 10 mg via INTRAVENOUS

## 2014-05-14 MED ORDER — HYDRALAZINE HCL 50 MG PO TABS
100.0000 mg | ORAL_TABLET | Freq: Once | ORAL | Status: AC
  Administered 2014-05-14: 100 mg via ORAL
  Filled 2014-05-14: qty 2

## 2014-05-14 MED ORDER — LIDOCAINE HCL (CARDIAC) 20 MG/ML IV SOLN
INTRAVENOUS | Status: DC | PRN
  Administered 2014-05-14: 50 mg via INTRAVENOUS

## 2014-05-14 MED ORDER — 0.9 % SODIUM CHLORIDE (POUR BTL) OPTIME
TOPICAL | Status: DC | PRN
  Administered 2014-05-14: 1000 mL

## 2014-05-14 MED ORDER — PHENYLEPHRINE HCL 10 MG/ML IJ SOLN
INTRAMUSCULAR | Status: DC | PRN
  Administered 2014-05-14: 80 ug via INTRAVENOUS
  Administered 2014-05-14: 120 ug via INTRAVENOUS
  Administered 2014-05-14 (×3): 80 ug via INTRAVENOUS

## 2014-05-14 MED ORDER — LIDOCAINE HCL (CARDIAC) 20 MG/ML IV SOLN
INTRAVENOUS | Status: AC
  Filled 2014-05-14: qty 5

## 2014-05-14 MED ORDER — PHENYLEPHRINE 40 MCG/ML (10ML) SYRINGE FOR IV PUSH (FOR BLOOD PRESSURE SUPPORT)
PREFILLED_SYRINGE | INTRAVENOUS | Status: AC
  Filled 2014-05-14: qty 20

## 2014-05-14 MED ORDER — DEXMEDETOMIDINE HCL IN NACL 200 MCG/50ML IV SOLN
INTRAVENOUS | Status: AC
  Filled 2014-05-14: qty 50

## 2014-05-14 SURGICAL SUPPLY — 65 items
BANDAGE ELASTIC 3 VELCRO ST LF (GAUZE/BANDAGES/DRESSINGS) ×3 IMPLANT
BLADE SURG 10 STRL SS (BLADE) ×3 IMPLANT
BLADE SURG 15 STRL LF DISP TIS (BLADE) ×4 IMPLANT
BLADE SURG 15 STRL SS (BLADE) ×2
BNDG GAUZE ELAST 4 BULKY (GAUZE/BANDAGES/DRESSINGS) ×3 IMPLANT
CORDS BIPOLAR (ELECTRODE) ×3 IMPLANT
DECANTER SPIKE VIAL GLASS SM (MISCELLANEOUS) ×3 IMPLANT
DRAPE EXTREMITY T 121X128X90 (DRAPE) ×3 IMPLANT
DRSG TELFA 3X8 NADH (GAUZE/BANDAGES/DRESSINGS) ×3 IMPLANT
DURAPREP 26ML APPLICATOR (WOUND CARE) ×3 IMPLANT
ELECT CAUTERY BLADE 6.4 (BLADE) ×3 IMPLANT
GAUZE SPONGE 4X4 12PLY STRL (GAUZE/BANDAGES/DRESSINGS) ×3 IMPLANT
GAUZE SPONGE 4X4 16PLY XRAY LF (GAUZE/BANDAGES/DRESSINGS) ×3 IMPLANT
GLOVE BIO SURGEON STRL SZ 6.5 (GLOVE) IMPLANT
GLOVE BIO SURGEON STRL SZ7 (GLOVE) IMPLANT
GLOVE BIO SURGEON STRL SZ7.5 (GLOVE) IMPLANT
GLOVE BIO SURGEON STRL SZ8 (GLOVE) IMPLANT
GLOVE BIO SURGEON STRL SZ8.5 (GLOVE) IMPLANT
GLOVE BIOGEL M 8.0 STRL (GLOVE) IMPLANT
GLOVE BIOGEL PI IND STRL 7.5 (GLOVE) ×4 IMPLANT
GLOVE BIOGEL PI INDICATOR 7.5 (GLOVE) ×2
GLOVE ECLIPSE 6.5 STRL STRAW (GLOVE) ×3 IMPLANT
GLOVE ECLIPSE 7.0 STRL STRAW (GLOVE) IMPLANT
GLOVE ECLIPSE 7.5 STRL STRAW (GLOVE) IMPLANT
GLOVE ECLIPSE 8.0 STRL XLNG CF (GLOVE) IMPLANT
GLOVE ECLIPSE 8.5 STRL (GLOVE) IMPLANT
GLOVE EXAM NITRILE LRG STRL (GLOVE) IMPLANT
GLOVE EXAM NITRILE MD LF STRL (GLOVE) IMPLANT
GLOVE EXAM NITRILE XL STR (GLOVE) IMPLANT
GLOVE EXAM NITRILE XS STR PU (GLOVE) IMPLANT
GLOVE INDICATOR 6.5 STRL GRN (GLOVE) IMPLANT
GLOVE INDICATOR 7.0 STRL GRN (GLOVE) IMPLANT
GLOVE INDICATOR 7.5 STRL GRN (GLOVE) IMPLANT
GLOVE INDICATOR 8.0 STRL GRN (GLOVE) IMPLANT
GLOVE INDICATOR 8.5 STRL (GLOVE) IMPLANT
GLOVE OPTIFIT SS 8.0 STRL (GLOVE) IMPLANT
GLOVE SS N UNI LF 7.5 STRL (GLOVE) ×6 IMPLANT
GLOVE SURG SS PI 6.5 STRL IVOR (GLOVE) IMPLANT
GOWN STRL REUS W/ TWL LRG LVL3 (GOWN DISPOSABLE) ×4 IMPLANT
GOWN STRL REUS W/ TWL XL LVL3 (GOWN DISPOSABLE) ×4 IMPLANT
GOWN STRL REUS W/TWL 2XL LVL3 (GOWN DISPOSABLE) IMPLANT
GOWN STRL REUS W/TWL LRG LVL3 (GOWN DISPOSABLE) ×2
GOWN STRL REUS W/TWL XL LVL3 (GOWN DISPOSABLE) ×2
KIT BASIN OR (CUSTOM PROCEDURE TRAY) ×3 IMPLANT
KIT ROOM TURNOVER OR (KITS) ×3 IMPLANT
LIQUID BAND (GAUZE/BANDAGES/DRESSINGS) IMPLANT
LOCATOR NERVE 3 VOLT (DISPOSABLE) IMPLANT
LOOP VESSEL MAXI BLUE (MISCELLANEOUS) IMPLANT
NEEDLE HYPO 25X1 1.5 SAFETY (NEEDLE) ×3 IMPLANT
NS IRRIG 1000ML POUR BTL (IV SOLUTION) ×3 IMPLANT
PACK SURGICAL SETUP 50X90 (CUSTOM PROCEDURE TRAY) ×3 IMPLANT
PAD ARMBOARD 7.5X6 YLW CONV (MISCELLANEOUS) ×6 IMPLANT
PENCIL BUTTON HOLSTER BLD 10FT (ELECTRODE) ×3 IMPLANT
STOCKINETTE 4X48 STRL (DRAPES) ×3 IMPLANT
SUT ETHILON 3 0 PS 1 (SUTURE) ×3 IMPLANT
SUT SILK 3 0 SH 30 (SUTURE) IMPLANT
SUT VIC AB 2-0 CT2 18 VCP726D (SUTURE) ×3 IMPLANT
SUT VIC AB 3-0 SH 8-18 (SUTURE) ×6 IMPLANT
SYR BULB 3OZ (MISCELLANEOUS) ×3 IMPLANT
SYR CONTROL 10ML LL (SYRINGE) ×3 IMPLANT
TOWEL OR 17X24 6PK STRL BLUE (TOWEL DISPOSABLE) ×3 IMPLANT
TOWEL OR 17X26 10 PK STRL BLUE (TOWEL DISPOSABLE) ×3 IMPLANT
TUBE CONNECTING 12X1/4 (SUCTIONS) ×3 IMPLANT
UNDERPAD 30X30 INCONTINENT (UNDERPADS AND DIAPERS) ×3 IMPLANT
WATER STERILE IRR 1000ML POUR (IV SOLUTION) ×3 IMPLANT

## 2014-05-14 NOTE — H&P (Signed)
BP 238/77 mmHg  Pulse 75  Temp(Src) 98.4 F (36.9 C) (Oral)  Resp 18  SpO2 98% Cc; left upper extremity pain, carpal tunnel syndrome, ulnar neuropathy [name] is a 68 y.o. female With recurrent carpal tunnel, and ulnar neuropathy No Known Allergies Prior to Admission medications   Medication Sig Start Date End Date Taking? Authorizing Provider  aspirin 81 MG tablet Take 81 mg by mouth daily.    Yes Historical Provider, MD  CINNAMON PO Take 1 capsule by mouth daily.   Yes Historical Provider, MD  diclofenac sodium (VOLTAREN) 1 % GEL Apply 2 g topically 4 (four) times daily as needed (for pain).    Yes Historical Provider, MD  ergocalciferol (VITAMIN D2) 50000 UNITS capsule Take 50,000 Units by mouth once a week. On Saturday.   Yes Historical Provider, MD  furosemide (LASIX) 40 MG tablet Take 40 mg by mouth daily as needed for fluid.    Yes Historical Provider, MD  glucose blood test strip 1 each by Other route See admin instructions. Check blood sugar once daily.   Yes Historical Provider, MD  hydrALAZINE (APRESOLINE) 100 MG tablet Take 100 mg by mouth 3 (three) times daily.   Yes Historical Provider, MD  HYDROcodone-acetaminophen (NORCO) 5-325 MG per tablet Take 1-2 tablets by mouth every 6 (six) hours as needed for moderate pain or severe pain. 01/26/14  Yes Fanny Skates, MD  HYDROmorphone (DILAUDID) 2 MG tablet Take 2 mg by mouth.   Yes Historical Provider, MD  insulin aspart (NOVOLOG) 100 UNIT/ML injection Inject 8 Units into the skin 2 (two) times daily.    Yes Historical Provider, MD  insulin glargine (LANTUS) 100 UNIT/ML injection Inject 28 Units into the skin daily.    Yes Historical Provider, MD  irbesartan (AVAPRO) 300 MG tablet Take 300 mg by mouth daily.   Yes Historical Provider, MD  isosorbide mononitrate (IMDUR) 60 MG 24 hr tablet Take 60 mg by mouth daily.   Yes Historical Provider, MD  latanoprost (XALATAN) 0.005 % ophthalmic solution Place 1 drop into both eyes  daily.  09/26/11  Yes Historical Provider, MD  meloxicam (MOBIC) 15 MG tablet Take 15 mg by mouth daily.  09/03/11  Yes Historical Provider, MD  metoprolol succinate (TOPROL-XL) 100 MG 24 hr tablet Take 100 mg by mouth daily. Take with or immediately following a meal.   Yes Historical Provider, MD  nitroGLYCERIN (NITROSTAT) 0.4 MG SL tablet Place 0.4 mg under the tongue every 5 (five) minutes as needed for chest pain.   Yes Historical Provider, MD  pantoprazole (PROTONIX) 40 MG tablet Take 1 tablet (40 mg total) by mouth daily. 01/28/14  Yes Laurey Morale, MD  rosuvastatin (CRESTOR) 40 MG tablet Take 40 mg by mouth daily.   Yes Historical Provider, MD  terconazole (TERAZOL 3) 0.8 % vaginal cream Place 1 applicator vaginally at bedtime. For 3 nights 05/11/14   Anastasio Auerbach, MD   Past Surgical History  Procedure Laterality Date  . Bilateral carpal tunnel release    . Colonoscopy  05-29-11    per Dr. Henrene Pastor, benign polyps, repeat in 5 yrs    . Esophagogastroduodenoscopy  02-08-06    per Dr. Henrene Pastor, gastritis   . Glaucoma surgery      with laser, per Dr. Henrene Pastor   . Carotid endarterectomy  march 2012    per Dr. Morton Amy  . Abdominal hysterectomy  age 45    TAH and USO  Leiomyomata  . Back  surgery  2008    lumb fusion  . Cholecystectomy    . Dilation and curettage of uterus    . Breast biopsy Left 01/26/2014    Procedure: EXCISION LEFT BREAST MASS;  Surgeon: Adin Hector, MD;  Location: [facility];  Service: General;  Laterality: Left;  Marland Kitchen Eye surgery    . Cardiac catheterization     Past Medical History  Diagnosis Date  . HTN (hypertension)   . Hyperlipidemia   . TIA (transient ischemic attack)     also Hx of it.   Marland Kitchen GERD (gastroesophageal reflux disease)   . Headache(784.0)   . Anemia, unspecified   . Edema   . Depressive disorder, not elsewhere classified   . Low back pain   . Obesity   . Duodenal nodule   . Hemorrhoids   . Arthritis   . Glaucoma      narrow angle, sees Dr. Herbert Deaner   . Type II or unspecified type diabetes mellitus without mention of complication, not stated as uncontrolled     sees Dr. Carrolyn Meiers   . Gastric polyps     COLON  . Allergy   . CAD (coronary artery disease)     sees. Dr. Burt Knack. s/p cath with PCI of RCA (xience des) June 2010. Pt also with LAD and D2 dzs-NL FFR in both areas med Rx  . Peripheral vascular disease   . Stroke     TIA  "many yrs ago"    . HSV (herpes simplex virus) anogenital infection 05/2014   History   Social History  . Marital Status: Married    Spouse Name: N/A    Number of Children: N/A  . Years of Education: N/A   Occupational History  . Not on file.   Social History Main Topics  . Smoking status: Former Smoker    Quit date: 01/22/1967  . Smokeless tobacco: Never Used     Comment: non smoker  . Alcohol Use: No  . Drug Use: No  . Sexual Activity: No     Comment: HYST   Other Topics Concern  . Not on file   Social History Narrative   Family History  Problem Relation Age of Onset  . Coronary artery disease Neg Hx     premature CAD  . Colon cancer Neg Hx   . Esophageal cancer Neg Hx   . Stomach cancer Neg Hx   . Hypertension Mother   . Hypertension Sister   . Hyperlipidemia Sister   . Hypertension Brother   . Cancer Brother     PROSTATE/LUNG  . Diabetes Brother   . Heart disease Brother     Before age 29 - Bypass  . Varicose Veins Brother   . Cancer Father     Prostate and pancreatic    Physical Exam  Constitutional: She is oriented to person, place, and time. She appears well-developed and well-nourished.  HENT:  Head: Normocephalic and atraumatic.  Eyes: EOM are normal. Pupils are equal, round, and reactive to light.  Neck: Normal range of motion. Neck supple.  Cardiovascular: Normal rate, regular rhythm, normal heart sounds and intact distal pulses.   Pulmonary/Chest: Effort normal and breath sounds normal.  Abdominal: Soft. Bowel sounds are normal.   Musculoskeletal: Normal range of motion.  Neurological: She is alert and oriented to person, place, and time. She has normal reflexes.  Tinel signs left carpal tunnel Left ulnar groove  Vitals reviewed.  Or for CTR, ulnar  nerve decompression

## 2014-05-14 NOTE — Progress Notes (Signed)
Upon arrival patient blood pressure 238/77, pt states she ran out of her toprol 2 days ago and did not take her hydralazine this morning. Notified dr. Tresa Moore and orders to give hydralazine and toprol.

## 2014-05-14 NOTE — Transfer of Care (Signed)
Immediate Anesthesia Transfer of Care Note  Patient: [name]  Procedure(s) Performed: Procedure(s) with comments: Left Carpal tunnel release (Left) - Left Carpal tunnel release Left Ulnar nerve decompression (Left) - Left Ulnar nerve decompression  Patient Location: PACU  Anesthesia Type:General  Level of Consciousness: awake, alert  and oriented  Airway & Oxygen Therapy: Patient Spontanous Breathing and Patient connected to nasal cannula oxygen  Post-op Assessment: Report given to PACU RN and Post -op Vital signs reviewed and stable  Post vital signs: Reviewed and stable  Complications: No apparent anesthesia complications

## 2014-05-14 NOTE — Anesthesia Postprocedure Evaluation (Signed)
  Anesthesia Post-op Note  Patient: [name]  Procedure(s) Performed: Procedure(s) with comments: Left Carpal tunnel release (Left) - Left Carpal tunnel release Left Ulnar nerve decompression (Left) - Left Ulnar nerve decompression  Patient Location: PACU  Anesthesia Type:General  Level of Consciousness: awake and alert   Airway and Oxygen Therapy: Patient Spontanous Breathing  Post-op Pain: mild, moderate  Post-op Assessment: Post-op Vital signs reviewed, Patient's Cardiovascular Status Stable, Respiratory Function Stable, Patent Airway and No signs of Nausea or vomiting  Post-op Vital Signs: Reviewed and stable  Last Vitals:  Filed Vitals:   05/14/14 1020  BP:   Pulse:   Temp: 36.5 C  Resp:     Complications: No apparent anesthesia complications

## 2014-05-14 NOTE — Discharge Instructions (Signed)
Carpal Tunnel Release (Repair)  °Care After  ° ° °Refer to this sheet in the next few weeks. These discharge instructions provide you with general information on caring for yourself after you leave the hospital. Your caregiver may also give you specific instructions. Your treatment has been planned according to the most current medical practices available, but unavoidable complications sometimes occur. If you have any problems or questions after discharge, please call your caregiver.  °HOME CARE INSTRUCTIONS  °Have a responsible person with you for 24 hours.  °Do not drive a car or take public transportation for 24 hours.  °Only take over-the-counter or prescription medicines for pain, discomfort, or fever as directed by your caregiver. Take them as directed.  °You may put ice on the palm side of the affected wrist.  °Put ice in a plastic bag.  °Place a towel between your skin and the bag.  °Leave the ice on for 15 to 20 minutes, 3 to 4 times per day.  °Remove the Ace bandage approximately 6pm the day of surgery °Keep your hand raised (elevated) above the level of your heart as much as possible. This keeps swelling down and helps with discomfort.  °Change bandages (dressings) as directed.  °Keep the wound clean and dry. °SEEK MEDICAL CARE IF:  °You develop pain not relieved with medicines.  °You develop numbness of your hand.  °You develop bleeding from your surgical site.  °You have an oral temperature above 102° F (38.9° C).  °You develop redness or swelling of the surgical site.  °You develop new, unexplained problems. °SEEK IMMEDIATE MEDICAL CARE IF:  °You develop a rash.  °You have difficulty breathing.  °You develop any reaction or side effects to medicines given. °MAKE SURE YOU:  °Understand these instructions.  °Will watch your condition.  °Will get help right away if you are not doing well or get worse.  °

## 2014-05-14 NOTE — Op Note (Signed)
05/14/2014  10:37 AM  PATIENT:  [name]  68 y.o. female  PRE-OPERATIVE DIAGNOSIS:  Left ulnar neuropathy; Left carpal tunnel syndrome  POST-OPERATIVE DIAGNOSIS:  Left ulnar neuropathy; Left carpal tunnel syndrome  PROCEDURE:  Procedure(s): Left Carpal tunnel release Left Ulnar nerve decompression  SURGEON:Labrisha Wuellner  ASSISTANTS:none  ANESTHESIA:   general  EBL:  Total I/O In: 1000 [I.V.:1000] Out: 10 [Blood:10]  BLOOD ADMINISTERED:none  CELL SAVER GIVEN:none  COUNT:per nursing  DRAINS: none   SPECIMEN:  No Specimen  DICTATION: [name] was brought to the operating room and placed under a general anesthetic using a Laryngeal Mask airway. She had her left upper extremity prepped and draped in a sterile manner. I started at the left elbow, infiltrating the planned semicircle incision, and then infiltrating the left proximal wrist for the carpal tunnel release.  I opened the skin with a 10 blade at the left elbow. I used sharp dissection with the blade and scissors to dissect to the ulnar groove. I used bipolar cautery to control vessels in the subcutaneous tissue. With continued dissection I identified the ulnar nerve and unroofed it both proximally and distally into the origin of the flexor carpi ulnaris. I used the nerve stimulator and had good movement of the ulnar innervated musculature. Satisfied with the decompression I irrigated then closed the wound. I approximated the subcutaneous, and subcuticular layers with vicryl sutures. I applied dermabond for a sterile dressing.  I then opened the old incision from her previous carpal tunnel release. I dissected sharply through the scar tissue until reaching the transverse carpal ligament. I actually was able to follow it proximally to distally. The ligament was partially ossified and quite thick. I used heavy scissors to divide the transverse carpal ligament, and the Kerrison punch to remove some of the ossified  ligament. The median nerve and contents of the carpal tunnel were well decompressed. I irrigated then closed the wound in a single layer with vertical interrupted mattress sutures. I applied bacitracin ointment, telfa, and a Kerlix for a dressing.   PLAN OF CARE: Discharge to home after PACU  PATIENT DISPOSITION:  PACU - hemodynamically stable.   Delay start of Pharmacological VTE agent (>24hrs) due to surgical blood loss or risk of bleeding:  yes

## 2014-05-14 NOTE — Anesthesia Preprocedure Evaluation (Signed)
Anesthesia Evaluation  Patient identified by MRN, date of birth, ID band Patient awake    Reviewed: Allergy & Precautions, H&P , NPO status , Patient's Chart, lab work & pertinent test results  Airway        Dental   Pulmonary asthma , former smoker,          Cardiovascular Exercise Tolerance: Good hypertension, Pt. on medications  Stent RCA 2010, ECHO 2010 EF >55%, cleared by cardiology for surgery 01/2014   Neuro/Psych Depression Carotids < 40% stenosis    GI/Hepatic GERD-  Medicated,  Endo/Other  diabetes, Poorly Controlled, Type 2  Renal/GU      Musculoskeletal   Abdominal   Peds  Hematology H/H 12/37   Anesthesia Other Findings   Reproductive/Obstetrics                             Anesthesia Physical Anesthesia Plan  ASA: III  Anesthesia Plan: General   Post-op Pain Management:    Induction: Intravenous  Airway Management Planned: LMA and Oral ETT  Additional Equipment:   Intra-op Plan:   Post-operative Plan: Extubation in OR  Informed Consent: I have reviewed the patients History and Physical, chart, labs and discussed the procedure including the risks, benefits and alternatives for the proposed anesthesia with the patient or authorized representative who has indicated his/her understanding and acceptance.     Plan Discussed with:   Anesthesia Plan Comments:         Anesthesia Quick Evaluation

## 2014-05-18 ENCOUNTER — Encounter (HOSPITAL_COMMUNITY): Payer: Self-pay | Admitting: Neurosurgery

## 2014-05-18 DIAGNOSIS — H402233 Chronic angle-closure glaucoma, bilateral, severe stage: Secondary | ICD-10-CM | POA: Diagnosis not present

## 2014-05-19 NOTE — Discharge Summary (Signed)
Physician Discharge Summary  Patient ID: [name] MRN: [id number] DOB/AGE: [birth year] 66 y.o.  Admit date: 05/14/2014 Discharge date: 05/19/2014  Admission Diagnoses:Left carpal tunnel syndrome, Left ulnar neuropathy  Discharge Diagnoses: Left carpal tunnel syndrome, Left ulnar neuropathy Active Problems:   Carpal tunnel syndrome   Discharged Condition: good  Hospital Course: [name] was taken to the operating room and underwent an uncomplicated ulnar nerve decompression at the cubital tunnel, and a left carpal tunnel release. She was moving her upper extremity well post op. Her wound was clean, dry, and without signs of infection.   Treatments: surgery: as Above  Discharge Exam: Blood pressure 160/70, pulse 76, temperature 97.6 F (36.4 C), temperature source Oral, resp. rate 14, SpO2 93 %. General appearance: alert, cooperative and appears stated age Neurologic: Alert and oriented X 3, normal strength and tone. Normal symmetric reflexes. Normal coordination and gait  Disposition: 01-Home or Self Care Left ulnar neuropathy; Left carpal tunnel syndrome    Medication List    TAKE these medications        aspirin 81 MG tablet  Take 81 mg by mouth daily.     CINNAMON PO  Take 1 capsule by mouth daily.     diclofenac sodium 1 % Gel  Commonly known as:  VOLTAREN  Apply 2 g topically 4 (four) times daily as needed (for pain).     ergocalciferol 50000 UNITS capsule  Commonly known as:  VITAMIN D2  Take 50,000 Units by mouth once a week. On Saturday.     furosemide 40 MG tablet  Commonly known as:  LASIX  Take 40 mg by mouth daily as needed for fluid.     glucose blood test strip  1 each by Other route See admin instructions. Check blood sugar once daily.     hydrALAZINE 100 MG tablet  Commonly known as:  APRESOLINE  Take 100 mg by mouth 3 (three) times daily.     HYDROcodone-acetaminophen 5-325 MG per tablet  Commonly known as:  NORCO  Take 1-2  tablets by mouth every 6 (six) hours as needed for moderate pain or severe pain.     HYDROmorphone 2 MG tablet  Commonly known as:  DILAUDID  Take 2 mg by mouth.     insulin aspart 100 UNIT/ML injection  Commonly known as:  novoLOG  Inject 8 Units into the skin 2 (two) times daily.     irbesartan 300 MG tablet  Commonly known as:  AVAPRO  Take 300 mg by mouth daily.     isosorbide mononitrate 60 MG 24 hr tablet  Commonly known as:  IMDUR  Take 60 mg by mouth daily.     LANTUS 100 UNIT/ML injection  Generic drug:  insulin glargine  Inject 28 Units into the skin daily.     latanoprost 0.005 % ophthalmic solution  Commonly known as:  XALATAN  Place 1 drop into both eyes daily.     meloxicam 15 MG tablet  Commonly known as:  MOBIC  Take 15 mg by mouth daily.     nitroGLYCERIN 0.4 MG SL tablet  Commonly known as:  NITROSTAT  Place 0.4 mg under the tongue every 5 (five) minutes as needed for chest pain.     pantoprazole 40 MG tablet  Commonly known as:  PROTONIX  Take 1 tablet (40 mg total) by mouth daily.     rosuvastatin 40 MG tablet  Commonly known as:  CRESTOR  Take 40 mg by mouth daily.  terconazole 0.8 % vaginal cream  Commonly known as:  TERAZOL 3  Place 1 applicator vaginally at bedtime. For 3 nights      ASK your doctor about these medications        metoprolol succinate 100 MG 24 hr tablet  Commonly known as:  TOPROL-XL  Take 100 mg by mouth daily. Take with or immediately following a meal.  Ask about: Which instructions should I use?     metoprolol succinate 100 MG 24 hr tablet  Commonly known as:  TOPROL-XL  TAKE ONE TABLET BY MOUTH ONCE DAILY  Ask about: Which instructions should I use?           Follow-up Information    Follow up with Marycarmen Hagey L, MD.   Specialty:  Neurosurgery   Why:  call office for suture removal in 10 days   Contact information:   [address] [name] [address] [phone]        Signed: Curlie Macken [name] 05/19/2014, 12:37 PM

## 2014-05-21 ENCOUNTER — Other Ambulatory Visit: Payer: Self-pay | Admitting: Gynecology

## 2014-05-21 ENCOUNTER — Telehealth: Payer: Self-pay

## 2014-05-21 MED ORDER — VALACYCLOVIR HCL 500 MG PO TABS: 500.0000 mg | ORAL_TABLET | Freq: Two times a day (BID) | ORAL | Status: DC

## 2014-05-21 NOTE — Telephone Encounter (Signed)
Patient said to let you know that she still has outbreak. It is a little better that when she was here. She wanted to know if Rx could help now?

## 2014-05-21 NOTE — Telephone Encounter (Signed)
Recommend Valtrex 500 mg twice a day 7 days.

## 2014-05-21 NOTE — Telephone Encounter (Signed)
-----   Message from Anastasio Auerbach, MD sent at 05/13/2014  2:13 PM EST ----- Tell patient her herpes screen was positive. Call me if she has any recurrent ulceration. The issue as to whether she should take daily suppressive medication or just treat for recurrent outbreaks. We can have that discussion if it looks like she is going to have recurrences.

## 2014-05-21 NOTE — Telephone Encounter (Signed)
Rx sent. Patient informed. 

## 2014-05-25 DIAGNOSIS — M79632 Pain in left forearm: Secondary | ICD-10-CM | POA: Diagnosis not present

## 2014-05-25 DIAGNOSIS — M79642 Pain in left hand: Secondary | ICD-10-CM | POA: Diagnosis not present

## 2014-05-25 DIAGNOSIS — M25442 Effusion, left hand: Secondary | ICD-10-CM | POA: Diagnosis not present

## 2014-05-25 DIAGNOSIS — M62542 Muscle wasting and atrophy, not elsewhere classified, left hand: Secondary | ICD-10-CM | POA: Diagnosis not present

## 2014-05-27 DIAGNOSIS — M79632 Pain in left forearm: Secondary | ICD-10-CM | POA: Diagnosis not present

## 2014-05-27 DIAGNOSIS — M79642 Pain in left hand: Secondary | ICD-10-CM | POA: Diagnosis not present

## 2014-05-27 DIAGNOSIS — M25442 Effusion, left hand: Secondary | ICD-10-CM | POA: Diagnosis not present

## 2014-05-27 DIAGNOSIS — M62542 Muscle wasting and atrophy, not elsewhere classified, left hand: Secondary | ICD-10-CM | POA: Diagnosis not present

## 2014-05-31 DIAGNOSIS — M25442 Effusion, left hand: Secondary | ICD-10-CM | POA: Diagnosis not present

## 2014-05-31 DIAGNOSIS — M79642 Pain in left hand: Secondary | ICD-10-CM | POA: Diagnosis not present

## 2014-05-31 DIAGNOSIS — M79632 Pain in left forearm: Secondary | ICD-10-CM | POA: Diagnosis not present

## 2014-05-31 DIAGNOSIS — M62542 Muscle wasting and atrophy, not elsewhere classified, left hand: Secondary | ICD-10-CM | POA: Diagnosis not present

## 2014-06-02 ENCOUNTER — Encounter (HOSPITAL_COMMUNITY)
Admission: RE | Admit: 2014-06-02 | Discharge: 2014-06-02 | Disposition: A | Discharge status: Home / Self Care | Payer: Medicare Other | Source: Ambulatory Visit | Attending: Neurosurgery | Admitting: Neurosurgery

## 2014-06-02 ENCOUNTER — Encounter (HOSPITAL_COMMUNITY): Payer: Self-pay

## 2014-06-02 DIAGNOSIS — M79632 Pain in left forearm: Secondary | ICD-10-CM | POA: Diagnosis not present

## 2014-06-02 DIAGNOSIS — Z01812 Encounter for preprocedural laboratory examination: Secondary | ICD-10-CM | POA: Diagnosis not present

## 2014-06-02 DIAGNOSIS — M79642 Pain in left hand: Secondary | ICD-10-CM | POA: Diagnosis not present

## 2014-06-02 DIAGNOSIS — M4802 Spinal stenosis, cervical region: Secondary | ICD-10-CM | POA: Insufficient documentation

## 2014-06-02 DIAGNOSIS — M25442 Effusion, left hand: Secondary | ICD-10-CM | POA: Diagnosis not present

## 2014-06-02 DIAGNOSIS — M62542 Muscle wasting and atrophy, not elsewhere classified, left hand: Secondary | ICD-10-CM | POA: Diagnosis not present

## 2014-06-02 LAB — BASIC METABOLIC PANEL
Anion gap: 13 (ref 5–15)
BUN: 17 mg/dL (ref 6–23)
CO2: 27 meq/L (ref 19–32)
CREATININE: 0.97 mg/dL (ref 0.50–1.10)
Calcium: 9.2 mg/dL (ref 8.4–10.5)
Chloride: 103 mEq/L (ref 96–112)
GFR calc Af Amer: 69 mL/min — ABNORMAL LOW (ref >90)
GFR calc non Af Amer: 60 mL/min — ABNORMAL LOW (ref >90)
GLUCOSE: 168 mg/dL — AB (ref 70–99)
Potassium: 3.8 mEq/L (ref 3.7–5.3)
Sodium: 143 mEq/L (ref 137–147)

## 2014-06-02 LAB — CBC
HEMATOCRIT: 37.5 % (ref 36.0–46.0)
Hemoglobin: 11.8 g/dL — ABNORMAL LOW (ref 12.0–15.0)
MCH: 27 pg (ref 26.0–34.0)
MCHC: 31.5 g/dL (ref 30.0–36.0)
MCV: 85.8 fL (ref 78.0–100.0)
Platelets: 261 10*3/uL (ref 150–400)
RBC: 4.37 MIL/uL (ref 3.87–5.11)
RDW: 15.3 % (ref 11.5–15.5)
WBC: 9 10*3/uL (ref 4.0–10.5)

## 2014-06-02 LAB — SURGICAL PCR SCREEN
MRSA, PCR: NEGATIVE
STAPHYLOCOCCUS AUREUS: NEGATIVE

## 2014-06-02 LAB — TYPE AND SCREEN
ABO/RH(D): A POS
Antibody Screen: NEGATIVE

## 2014-06-02 NOTE — Progress Notes (Signed)
Primary - dr. Cherlynn Perches Cardiologist - dr. Legrand Como cooper No recent cardiac testing

## 2014-06-02 NOTE — Pre-Procedure Instructions (Signed)
[name]  06/02/2014   Your procedure is scheduled on:  Friday, December 4th  Report to [facility] Admitting at 6 AM.  Call this number if you have problems the morning of surgery: [phone]   Remember:   Do not eat food or drink liquids after midnight.   Take these medicines the morning of surgery with A SIP OF WATER: hydralazine, imdur, toprol, protonix, pain medication if needed  No insulin morning of surgery.   Stop taking aspirin, OTC vitamins/herbal medications, NSAIDS (ibuprofen, advil, motrin, mobic) 5 days prior to surgery.   Do not wear jewelry, make-up or nail polish.  Do not wear lotions, powders, or perfumes,deodorant.  Do not shave 48 hours prior to surgery. Men may shave face and neck.  Do not bring valuables to the hospital.  [facility] is not responsible  for any belongings or valuables.               Contacts, dentures or bridgework may not be worn into surgery.  Leave suitcase in the car. After surgery it may be brought to your room.  For patients admitted to the hospital, discharge time is determined by your treatment team.               Patients discharged the day of surgery will not be allowed to drive home.  Please read over the following fact sheets that you were given: Pain Booklet, Coughing and Deep Breathing, Blood Transfusion Information, MRSA Information and Surgical Site Infection Prevention  [place] - Preparing for Surgery  Before surgery, you can play an important role.  Because skin is not sterile, your skin needs to be as free of germs as possible.  You can reduce the number of germs on you skin by washing with CHG (chlorahexidine gluconate) soap before surgery.  CHG is an antiseptic cleaner which kills germs and bonds with the skin to continue killing germs even after washing.  Please DO NOT use if you have an allergy to CHG or antibacterial soaps.  If your skin becomes reddened/irritated stop using the CHG and inform  your nurse when you arrive at Short Stay.  Do not shave (including legs and underarms) for at least 48 hours prior to the first CHG shower.  You may shave your face.  Please follow these instructions carefully:   1.  Shower with CHG Soap the night before surgery and the morning of Surgery.  2.  If you choose to wash your hair, wash your hair first as usual with your normal shampoo.  3.  After you shampoo, rinse your hair and body thoroughly to remove the shampoo.  4.  Use CHG as you would any other liquid soap.  You can apply CHG directly to the skin and wash gently with scrungie or a clean washcloth.  5.  Apply the CHG Soap to your body ONLY FROM THE NECK DOWN.  Do not use on open wounds or open sores.  Avoid contact with your eyes, ears, mouth and genitals (private parts).  Wash genitals (private parts) with your normal soap.  6.  Wash thoroughly, paying special attention to the area where your surgery will be performed.  7.  Thoroughly rinse your body with warm water from the neck down.  8.  DO NOT shower/wash with your normal soap after using and rinsing off the CHG Soap.  9.  Pat yourself dry with a clean towel.  10.  Wear clean pajamas.            11.  Place clean sheets on your bed the night of your first shower and do not sleep with pets.  Day of Surgery  Do not apply any lotions/deoderants the morning of surgery.  Please wear clean clothes to the hospital/surgery center.

## 2014-06-08 DIAGNOSIS — M4802 Spinal stenosis, cervical region: Secondary | ICD-10-CM | POA: Diagnosis not present

## 2014-06-08 DIAGNOSIS — Z789 Other specified health status: Secondary | ICD-10-CM | POA: Diagnosis not present

## 2014-06-08 DIAGNOSIS — M62542 Muscle wasting and atrophy, not elsewhere classified, left hand: Secondary | ICD-10-CM | POA: Diagnosis not present

## 2014-06-08 DIAGNOSIS — M79632 Pain in left forearm: Secondary | ICD-10-CM | POA: Diagnosis not present

## 2014-06-08 DIAGNOSIS — Z01818 Encounter for other preprocedural examination: Secondary | ICD-10-CM | POA: Diagnosis not present

## 2014-06-08 DIAGNOSIS — M79642 Pain in left hand: Secondary | ICD-10-CM | POA: Diagnosis not present

## 2014-06-08 DIAGNOSIS — M25442 Effusion, left hand: Secondary | ICD-10-CM | POA: Diagnosis not present

## 2014-06-10 DIAGNOSIS — M79632 Pain in left forearm: Secondary | ICD-10-CM | POA: Diagnosis not present

## 2014-06-10 DIAGNOSIS — M79642 Pain in left hand: Secondary | ICD-10-CM | POA: Diagnosis not present

## 2014-06-10 DIAGNOSIS — M25442 Effusion, left hand: Secondary | ICD-10-CM | POA: Diagnosis not present

## 2014-06-10 DIAGNOSIS — M62542 Muscle wasting and atrophy, not elsewhere classified, left hand: Secondary | ICD-10-CM | POA: Diagnosis not present

## 2014-06-10 MED ORDER — CEFAZOLIN SODIUM-DEXTROSE 2-3 GM-% IV SOLR
2.0000 g | INTRAVENOUS | Status: AC
  Administered 2014-06-11: 2 g via INTRAVENOUS
  Filled 2014-06-10: qty 50

## 2014-06-11 ENCOUNTER — Inpatient Hospital Stay (HOSPITAL_COMMUNITY): Payer: Medicare Other

## 2014-06-11 ENCOUNTER — Encounter (HOSPITAL_COMMUNITY): Payer: Self-pay | Admitting: *Deleted

## 2014-06-11 ENCOUNTER — Inpatient Hospital Stay (HOSPITAL_COMMUNITY)
Admission: RE | Admit: 2014-06-11 | Discharge: 2014-06-13 | DRG: 472 | Disposition: A | Discharge status: Home / Self Care | Payer: Medicare Other | Source: Ambulatory Visit | Attending: Neurosurgery | Admitting: Neurosurgery

## 2014-06-11 ENCOUNTER — Inpatient Hospital Stay (HOSPITAL_COMMUNITY): Payer: Medicare Other | Admitting: Anesthesiology

## 2014-06-11 ENCOUNTER — Encounter (HOSPITAL_COMMUNITY)
Admission: RE | Disposition: A | Discharge status: Home / Self Care | Payer: Self-pay | Source: Ambulatory Visit | Attending: Neurosurgery

## 2014-06-11 DIAGNOSIS — M4712 Other spondylosis with myelopathy, cervical region: Secondary | ICD-10-CM | POA: Diagnosis not present

## 2014-06-11 DIAGNOSIS — H409 Unspecified glaucoma: Secondary | ICD-10-CM | POA: Diagnosis present

## 2014-06-11 DIAGNOSIS — Z8249 Family history of ischemic heart disease and other diseases of the circulatory system: Secondary | ICD-10-CM | POA: Diagnosis not present

## 2014-06-11 DIAGNOSIS — M4722 Other spondylosis with radiculopathy, cervical region: Secondary | ICD-10-CM

## 2014-06-11 DIAGNOSIS — I10 Essential (primary) hypertension: Secondary | ICD-10-CM | POA: Diagnosis present

## 2014-06-11 DIAGNOSIS — E785 Hyperlipidemia, unspecified: Secondary | ICD-10-CM | POA: Diagnosis present

## 2014-06-11 DIAGNOSIS — Z886 Allergy status to analgesic agent status: Secondary | ICD-10-CM

## 2014-06-11 DIAGNOSIS — Z833 Family history of diabetes mellitus: Secondary | ICD-10-CM

## 2014-06-11 DIAGNOSIS — Z9861 Coronary angioplasty status: Secondary | ICD-10-CM | POA: Diagnosis not present

## 2014-06-11 DIAGNOSIS — Z794 Long term (current) use of insulin: Secondary | ICD-10-CM | POA: Diagnosis not present

## 2014-06-11 DIAGNOSIS — K219 Gastro-esophageal reflux disease without esophagitis: Secondary | ICD-10-CM | POA: Diagnosis present

## 2014-06-11 DIAGNOSIS — Z880 Allergy status to penicillin: Secondary | ICD-10-CM

## 2014-06-11 DIAGNOSIS — E119 Type 2 diabetes mellitus without complications: Secondary | ICD-10-CM | POA: Diagnosis present

## 2014-06-11 DIAGNOSIS — M5012 Cervical disc disorder with radiculopathy, mid-cervical region: Principal | ICD-10-CM | POA: Diagnosis present

## 2014-06-11 DIAGNOSIS — Z91040 Latex allergy status: Secondary | ICD-10-CM

## 2014-06-11 DIAGNOSIS — M1990 Unspecified osteoarthritis, unspecified site: Secondary | ICD-10-CM | POA: Diagnosis present

## 2014-06-11 DIAGNOSIS — M549 Dorsalgia, unspecified: Secondary | ICD-10-CM | POA: Diagnosis not present

## 2014-06-11 DIAGNOSIS — Z809 Family history of malignant neoplasm, unspecified: Secondary | ICD-10-CM

## 2014-06-11 DIAGNOSIS — Z87891 Personal history of nicotine dependence: Secondary | ICD-10-CM

## 2014-06-11 DIAGNOSIS — Z981 Arthrodesis status: Secondary | ICD-10-CM | POA: Diagnosis not present

## 2014-06-11 DIAGNOSIS — Z8673 Personal history of transient ischemic attack (TIA), and cerebral infarction without residual deficits: Secondary | ICD-10-CM | POA: Diagnosis not present

## 2014-06-11 DIAGNOSIS — M4802 Spinal stenosis, cervical region: Secondary | ICD-10-CM | POA: Diagnosis present

## 2014-06-11 DIAGNOSIS — I2510 Atherosclerotic heart disease of native coronary artery without angina pectoris: Secondary | ICD-10-CM | POA: Diagnosis not present

## 2014-06-11 HISTORY — PX: ANTERIOR CERVICAL CORPECTOMY: SHX1159

## 2014-06-11 LAB — GLUCOSE, CAPILLARY
GLUCOSE-CAPILLARY: 282 mg/dL — AB (ref 70–99)
Glucose-Capillary: 133 mg/dL — ABNORMAL HIGH (ref 70–99)
Glucose-Capillary: 240 mg/dL — ABNORMAL HIGH (ref 70–99)

## 2014-06-11 SURGERY — ANTERIOR CERVICAL CORPECTOMY
Anesthesia: General

## 2014-06-11 MED ORDER — HYDROCODONE-ACETAMINOPHEN 5-325 MG PO TABS
1.0000 | ORAL_TABLET | Freq: Four times a day (QID) | ORAL | Status: DC | PRN
  Administered 2014-06-12 – 2014-06-13 (×5): 2 via ORAL
  Filled 2014-06-11 (×3): qty 2

## 2014-06-11 MED ORDER — PROPOFOL 10 MG/ML IV BOLUS
INTRAVENOUS | Status: DC | PRN
  Administered 2014-06-11: 150 mg via INTRAVENOUS

## 2014-06-11 MED ORDER — GELATIN ABSORBABLE MT POWD
OROMUCOSAL | Status: DC | PRN
  Administered 2014-06-11: 12:00:00 via TOPICAL

## 2014-06-11 MED ORDER — THROMBIN 20000 UNITS EX SOLR
CUTANEOUS | Status: DC | PRN
  Administered 2014-06-11: 09:00:00 via TOPICAL

## 2014-06-11 MED ORDER — CINNAMON 500 MG PO CAPS: 1.0000 | ORAL_CAPSULE | Freq: Every day | ORAL | Status: DC

## 2014-06-11 MED ORDER — LACTATED RINGERS IV SOLN
INTRAVENOUS | Status: DC | PRN
  Administered 2014-06-11 (×3): via INTRAVENOUS

## 2014-06-11 MED ORDER — SODIUM CHLORIDE 0.9 % IJ SOLN
3.0000 mL | Freq: Two times a day (BID) | INTRAMUSCULAR | Status: DC
  Administered 2014-06-12 – 2014-06-13 (×2): 3 mL via INTRAVENOUS

## 2014-06-11 MED ORDER — ONDANSETRON HCL 4 MG/2ML IJ SOLN
INTRAMUSCULAR | Status: DC | PRN
  Administered 2014-06-11: 4 mg via INTRAVENOUS

## 2014-06-11 MED ORDER — POTASSIUM CHLORIDE IN NACL 20-0.9 MEQ/L-% IV SOLN
INTRAVENOUS | Status: DC
  Administered 2014-06-12: 06:00:00 via INTRAVENOUS

## 2014-06-11 MED ORDER — ISOSORBIDE MONONITRATE ER 30 MG PO TB24
60.0000 mg | ORAL_TABLET | Freq: Every day | ORAL | Status: DC
  Administered 2014-06-11: 60 mg via ORAL
  Filled 2014-06-11: qty 2

## 2014-06-11 MED ORDER — FENTANYL CITRATE 0.05 MG/ML IJ SOLN
INTRAMUSCULAR | Status: AC
Start: 2014-06-11 — End: 2014-06-11
  Filled 2014-06-11: qty 5

## 2014-06-11 MED ORDER — LIDOCAINE-EPINEPHRINE 0.5 %-1:200000 IJ SOLN
INTRAMUSCULAR | Status: DC | PRN
  Administered 2014-06-11: 5.5 mL

## 2014-06-11 MED ORDER — IRBESARTAN 300 MG PO TABS
300.0000 mg | ORAL_TABLET | Freq: Every day | ORAL | Status: DC
  Administered 2014-06-11: 300 mg via ORAL
  Filled 2014-06-11: qty 1

## 2014-06-11 MED ORDER — PHENYLEPHRINE HCL 10 MG/ML IJ SOLN
INTRAMUSCULAR | Status: DC | PRN
  Administered 2014-06-11: 40 ug via INTRAVENOUS
  Administered 2014-06-11 (×3): 80 ug via INTRAVENOUS

## 2014-06-11 MED ORDER — PHENYLEPHRINE 40 MCG/ML (10ML) SYRINGE FOR IV PUSH (FOR BLOOD PRESSURE SUPPORT)
PREFILLED_SYRINGE | INTRAVENOUS | Status: AC
  Filled 2014-06-11: qty 10

## 2014-06-11 MED ORDER — LATANOPROST 0.005 % OP SOLN
1.0000 [drp] | Freq: Every day | OPHTHALMIC | Status: DC
  Administered 2014-06-11 – 2014-06-12 (×2): 1 [drp] via OPHTHALMIC
  Filled 2014-06-11: qty 2.5

## 2014-06-11 MED ORDER — ERGOCALCIFEROL 1.25 MG (50000 UT) PO CAPS
50000.0000 [IU] | ORAL_CAPSULE | ORAL | Status: DC
  Administered 2014-06-12: 50000 [IU] via ORAL
  Filled 2014-06-11: qty 1

## 2014-06-11 MED ORDER — ACETAMINOPHEN 650 MG RE SUPP: 650.0000 mg | RECTAL | Status: DC | PRN

## 2014-06-11 MED ORDER — POLYETHYLENE GLYCOL 3350 17 G PO PACK: 17.0000 g | PACK | Freq: Every day | ORAL | Status: DC | PRN

## 2014-06-11 MED ORDER — ASPIRIN 81 MG PO TABS: 81.0000 mg | ORAL_TABLET | Freq: Every day | ORAL | Status: DC

## 2014-06-11 MED ORDER — EPHEDRINE SULFATE 50 MG/ML IJ SOLN
INTRAMUSCULAR | Status: DC | PRN
  Administered 2014-06-11 (×2): 10 mg via INTRAVENOUS

## 2014-06-11 MED ORDER — FENTANYL CITRATE 0.05 MG/ML IJ SOLN
INTRAMUSCULAR | Status: DC | PRN
  Administered 2014-06-11 (×3): 50 ug via INTRAVENOUS

## 2014-06-11 MED ORDER — HYDRALAZINE HCL 50 MG PO TABS
100.0000 mg | ORAL_TABLET | Freq: Three times a day (TID) | ORAL | Status: DC
  Administered 2014-06-11 – 2014-06-12 (×2): 100 mg via ORAL
  Filled 2014-06-11 (×8): qty 2

## 2014-06-11 MED ORDER — ONDANSETRON HCL 4 MG/2ML IJ SOLN: 4.0000 mg | INTRAMUSCULAR | Status: DC | PRN

## 2014-06-11 MED ORDER — PANTOPRAZOLE SODIUM 40 MG PO TBEC
40.0000 mg | DELAYED_RELEASE_TABLET | Freq: Every day | ORAL | Status: DC
  Administered 2014-06-12 – 2014-06-13 (×2): 40 mg via ORAL
  Filled 2014-06-11: qty 1

## 2014-06-11 MED ORDER — NITROGLYCERIN 0.4 MG SL SUBL: 0.4000 mg | SUBLINGUAL_TABLET | SUBLINGUAL | Status: DC | PRN

## 2014-06-11 MED ORDER — 0.9 % SODIUM CHLORIDE (POUR BTL) OPTIME
TOPICAL | Status: DC | PRN
  Administered 2014-06-11: 1000 mL

## 2014-06-11 MED ORDER — MENTHOL 3 MG MT LOZG: 1.0000 | LOZENGE | OROMUCOSAL | Status: DC | PRN

## 2014-06-11 MED ORDER — SENNA 8.6 MG PO TABS
1.0000 | ORAL_TABLET | Freq: Two times a day (BID) | ORAL | Status: DC
  Administered 2014-06-12 – 2014-06-13 (×3): 8.6 mg via ORAL
  Filled 2014-06-11 (×2): qty 1

## 2014-06-11 MED ORDER — SODIUM CHLORIDE 0.9 % IJ SOLN: 3.0000 mL | INTRAMUSCULAR | Status: DC | PRN

## 2014-06-11 MED ORDER — SODIUM CHLORIDE 0.9 % IV SOLN: 250.0000 mL | INTRAVENOUS | Status: DC

## 2014-06-11 MED ORDER — OXYCODONE-ACETAMINOPHEN 5-325 MG PO TABS: 1.0000 | ORAL_TABLET | ORAL | Status: DC | PRN

## 2014-06-11 MED ORDER — GLYCOPYRROLATE 0.2 MG/ML IJ SOLN
INTRAMUSCULAR | Status: DC | PRN
  Administered 2014-06-11: .2 mg via INTRAVENOUS
  Administered 2014-06-11: .8 mg via INTRAVENOUS

## 2014-06-11 MED ORDER — INSULIN ASPART 100 UNIT/ML ~~LOC~~ SOLN
0.0000 [IU] | Freq: Every day | SUBCUTANEOUS | Status: DC
  Administered 2014-06-11: 3 [IU] via SUBCUTANEOUS

## 2014-06-11 MED ORDER — MIDAZOLAM HCL 5 MG/5ML IJ SOLN
INTRAMUSCULAR | Status: DC | PRN
  Administered 2014-06-11: 2 mg via INTRAVENOUS

## 2014-06-11 MED ORDER — LIDOCAINE HCL (CARDIAC) 20 MG/ML IV SOLN
INTRAVENOUS | Status: DC | PRN
Start: 2014-06-11 — End: 2014-06-11
  Administered 2014-06-11: 100 mg via INTRAVENOUS

## 2014-06-11 MED ORDER — METOPROLOL SUCCINATE ER 100 MG PO TB24
100.0000 mg | ORAL_TABLET | Freq: Every day | ORAL | Status: DC
  Filled 2014-06-11: qty 1

## 2014-06-11 MED ORDER — INSULIN GLARGINE 100 UNIT/ML ~~LOC~~ SOLN
28.0000 [IU] | Freq: Every day | SUBCUTANEOUS | Status: DC
  Administered 2014-06-11 – 2014-06-13 (×3): 28 [IU] via SUBCUTANEOUS
  Filled 2014-06-11 (×3): qty 0.28

## 2014-06-11 MED ORDER — ONDANSETRON HCL 4 MG/2ML IJ SOLN: 4.0000 mg | Freq: Four times a day (QID) | INTRAMUSCULAR | Status: DC | PRN

## 2014-06-11 MED ORDER — ROSUVASTATIN CALCIUM 40 MG PO TABS
40.0000 mg | ORAL_TABLET | Freq: Every day | ORAL | Status: DC
  Administered 2014-06-12 – 2014-06-13 (×2): 40 mg via ORAL
  Filled 2014-06-11 (×3): qty 1

## 2014-06-11 MED ORDER — INSULIN ASPART 100 UNIT/ML ~~LOC~~ SOLN
8.0000 [IU] | Freq: Two times a day (BID) | SUBCUTANEOUS | Status: DC
  Administered 2014-06-11 – 2014-06-13 (×4): 8 [IU] via SUBCUTANEOUS

## 2014-06-11 MED ORDER — ACETAMINOPHEN 325 MG PO TABS: 650.0000 mg | ORAL_TABLET | ORAL | Status: DC | PRN

## 2014-06-11 MED ORDER — ROCURONIUM BROMIDE 100 MG/10ML IV SOLN
INTRAVENOUS | Status: DC | PRN
  Administered 2014-06-11: 50 mg via INTRAVENOUS

## 2014-06-11 MED ORDER — METOPROLOL SUCCINATE ER 100 MG PO TB24: 100.0000 mg | ORAL_TABLET | Freq: Every day | ORAL | Status: DC

## 2014-06-11 MED ORDER — HYDROMORPHONE HCL 1 MG/ML IJ SOLN: 0.2500 mg | INTRAMUSCULAR | Status: DC | PRN

## 2014-06-11 MED ORDER — VECURONIUM BROMIDE 10 MG IV SOLR
INTRAVENOUS | Status: DC | PRN
  Administered 2014-06-11: 4 mg via INTRAVENOUS

## 2014-06-11 MED ORDER — CEFAZOLIN SODIUM 1-5 GM-% IV SOLN
1.0000 g | Freq: Three times a day (TID) | INTRAVENOUS | Status: AC
  Administered 2014-06-11 – 2014-06-12 (×2): 1 g via INTRAVENOUS
  Filled 2014-06-11 (×3): qty 50

## 2014-06-11 MED ORDER — LACTATED RINGERS IV SOLN
INTRAVENOUS | Status: DC
  Administered 2014-06-11: 08:00:00 via INTRAVENOUS

## 2014-06-11 MED ORDER — DEXAMETHASONE SODIUM PHOSPHATE 10 MG/ML IJ SOLN
INTRAMUSCULAR | Status: DC | PRN
  Administered 2014-06-11: 10 mg via INTRAVENOUS

## 2014-06-11 MED ORDER — INSULIN ASPART 100 UNIT/ML ~~LOC~~ SOLN
8.0000 [IU] | Freq: Two times a day (BID) | SUBCUTANEOUS | Status: DC
Start: 2014-06-11 — End: 2014-06-11

## 2014-06-11 MED ORDER — FUROSEMIDE 40 MG PO TABS: 40.0000 mg | ORAL_TABLET | Freq: Every day | ORAL | Status: DC | PRN

## 2014-06-11 MED ORDER — HYDROMORPHONE HCL 2 MG PO TABS: 2.0000 mg | ORAL_TABLET | Freq: Four times a day (QID) | ORAL | Status: DC | PRN

## 2014-06-11 MED ORDER — LABETALOL HCL 5 MG/ML IV SOLN
10.0000 mg | INTRAVENOUS | Status: DC | PRN
  Administered 2014-06-11 (×2): 10 mg via INTRAVENOUS

## 2014-06-11 MED ORDER — OXYCODONE HCL 5 MG PO TABS: 5.0000 mg | ORAL_TABLET | Freq: Once | ORAL | Status: DC | PRN

## 2014-06-11 MED ORDER — ALBUMIN HUMAN 5 % IV SOLN
INTRAVENOUS | Status: DC | PRN
  Administered 2014-06-11: 12:00:00 via INTRAVENOUS

## 2014-06-11 MED ORDER — PHENOL 1.4 % MT LIQD: 1.0000 | OROMUCOSAL | Status: DC | PRN

## 2014-06-11 MED ORDER — CEFAZOLIN SODIUM-DEXTROSE 2-3 GM-% IV SOLR: 2.0000 g | INTRAVENOUS | Status: DC

## 2014-06-11 MED ORDER — NEOSTIGMINE METHYLSULFATE 10 MG/10ML IV SOLN
INTRAVENOUS | Status: DC | PRN
  Administered 2014-06-11: 4 mg via INTRAVENOUS

## 2014-06-11 MED ORDER — SODIUM CHLORIDE 0.9 % IV SOLN
10.0000 mg | INTRAVENOUS | Status: DC | PRN
  Administered 2014-06-11: 20 ug/min via INTRAVENOUS

## 2014-06-11 MED ORDER — LABETALOL HCL 5 MG/ML IV SOLN
INTRAVENOUS | Status: AC
  Filled 2014-06-11: qty 4

## 2014-06-11 MED ORDER — HYDROMORPHONE HCL 1 MG/ML IJ SOLN: 0.5000 mg | INTRAMUSCULAR | Status: DC | PRN

## 2014-06-11 MED ORDER — OXYCODONE HCL 5 MG/5ML PO SOLN: 5.0000 mg | Freq: Once | ORAL | Status: DC | PRN

## 2014-06-11 MED ORDER — MIDAZOLAM HCL 2 MG/2ML IJ SOLN
INTRAMUSCULAR | Status: AC
  Filled 2014-06-11: qty 2

## 2014-06-11 MED ORDER — ARTIFICIAL TEARS OP OINT
TOPICAL_OINTMENT | OPHTHALMIC | Status: DC | PRN
  Administered 2014-06-11: 1 via OPHTHALMIC

## 2014-06-11 MED ORDER — ASPIRIN 81 MG PO CHEW
81.0000 mg | CHEWABLE_TABLET | Freq: Every day | ORAL | Status: DC
  Administered 2014-06-11 – 2014-06-13 (×3): 81 mg via ORAL
  Filled 2014-06-11: qty 1

## 2014-06-11 SURGICAL SUPPLY — 83 items
BANDAGE ADH SHEER 1  50/CT (GAUZE/BANDAGES/DRESSINGS) ×3 IMPLANT
BIT DRILL NEURO 2X3.1 SFT TUCH (MISCELLANEOUS) ×1 IMPLANT
BLADE CLIPPER SURG (BLADE) IMPLANT
BNDG GAUZE ELAST 4 BULKY (GAUZE/BANDAGES/DRESSINGS) IMPLANT
BUR DRUM 4.0 (BURR) ×2 IMPLANT
BUR DRUM 4.0MM (BURR) ×1
CANISTER SUCT 3000ML (MISCELLANEOUS) ×3 IMPLANT
CONT SPEC 4OZ CLIKSEAL STRL BL (MISCELLANEOUS) ×3 IMPLANT
DECANTER SPIKE VIAL GLASS SM (MISCELLANEOUS) ×3 IMPLANT
DERMABOND ADVANCED (GAUZE/BANDAGES/DRESSINGS) ×2
DERMABOND ADVANCED .7 DNX12 (GAUZE/BANDAGES/DRESSINGS) ×1 IMPLANT
DRAPE LAPAROTOMY 100X72 PEDS (DRAPES) ×3 IMPLANT
DRAPE MICROSCOPE LEICA (MISCELLANEOUS) ×3 IMPLANT
DRAPE POUCH INSTRU U-SHP 10X18 (DRAPES) ×3 IMPLANT
DRAPE PROXIMA HALF (DRAPES) IMPLANT
DRILL NEURO 2X3.1 SOFT TOUCH (MISCELLANEOUS) ×3
DURAPREP 6ML APPLICATOR 50/CS (WOUND CARE) ×3 IMPLANT
ELECT COATED BLADE 2.86 ST (ELECTRODE) ×3 IMPLANT
ELECT REM PT RETURN 9FT ADLT (ELECTROSURGICAL) ×3
ELECTRODE REM PT RTRN 9FT ADLT (ELECTROSURGICAL) ×1 IMPLANT
GAUZE SPONGE 4X4 16PLY XRAY LF (GAUZE/BANDAGES/DRESSINGS) IMPLANT
GLOVE BIO SURGEON STRL SZ 6.5 (GLOVE) IMPLANT
GLOVE BIO SURGEON STRL SZ7 (GLOVE) IMPLANT
GLOVE BIO SURGEON STRL SZ7.5 (GLOVE) IMPLANT
GLOVE BIO SURGEON STRL SZ8 (GLOVE) IMPLANT
GLOVE BIO SURGEON STRL SZ8.5 (GLOVE) IMPLANT
GLOVE BIO SURGEONS STRL SZ 6.5 (GLOVE)
GLOVE BIOGEL M 8.0 STRL (GLOVE) IMPLANT
GLOVE BIOGEL PI IND STRL 7.5 (GLOVE) ×1 IMPLANT
GLOVE BIOGEL PI INDICATOR 7.5 (GLOVE) ×2
GLOVE ECLIPSE 6.5 STRL STRAW (GLOVE) ×3 IMPLANT
GLOVE ECLIPSE 7.0 STRL STRAW (GLOVE) IMPLANT
GLOVE ECLIPSE 7.5 STRL STRAW (GLOVE) ×3 IMPLANT
GLOVE ECLIPSE 8.0 STRL XLNG CF (GLOVE) IMPLANT
GLOVE ECLIPSE 8.5 STRL (GLOVE) IMPLANT
GLOVE EXAM NITRILE LRG STRL (GLOVE) IMPLANT
GLOVE EXAM NITRILE MD LF STRL (GLOVE) IMPLANT
GLOVE EXAM NITRILE XL STR (GLOVE) IMPLANT
GLOVE EXAM NITRILE XS STR PU (GLOVE) IMPLANT
GLOVE INDICATOR 6.5 STRL GRN (GLOVE) IMPLANT
GLOVE INDICATOR 7.0 STRL GRN (GLOVE) IMPLANT
GLOVE INDICATOR 7.5 STRL GRN (GLOVE) ×3 IMPLANT
GLOVE INDICATOR 8.0 STRL GRN (GLOVE) IMPLANT
GLOVE INDICATOR 8.5 STRL (GLOVE) IMPLANT
GLOVE OPTIFIT SS 8.0 STRL (GLOVE) IMPLANT
GLOVE SURG SS PI 6.5 STRL IVOR (GLOVE) IMPLANT
GLOVE SURG SS PI 7.5 STRL IVOR (GLOVE) ×3 IMPLANT
GOWN STRL REUS W/ TWL LRG LVL3 (GOWN DISPOSABLE) ×2 IMPLANT
GOWN STRL REUS W/ TWL XL LVL3 (GOWN DISPOSABLE) ×3 IMPLANT
GOWN STRL REUS W/TWL 2XL LVL3 (GOWN DISPOSABLE) IMPLANT
GOWN STRL REUS W/TWL LRG LVL3 (GOWN DISPOSABLE) ×4
GOWN STRL REUS W/TWL XL LVL3 (GOWN DISPOSABLE) ×6
HALTER HD/CHIN CERV TRACTION D (MISCELLANEOUS) ×3 IMPLANT
HEMOSTAT POWDER SURGIFOAM 1G (HEMOSTASIS) ×3 IMPLANT
HEMOSTAT SURGICEL 2X14 (HEMOSTASIS) IMPLANT
KIT BASIN OR (CUSTOM PROCEDURE TRAY) ×3 IMPLANT
KIT ROOM TURNOVER OR (KITS) ×3 IMPLANT
LIQUID BAND (GAUZE/BANDAGES/DRESSINGS) ×3 IMPLANT
NEEDLE HYPO 25X1 1.5 SAFETY (NEEDLE) ×3 IMPLANT
NEEDLE SPNL 22GX3.5 QUINCKE BK (NEEDLE) ×9 IMPLANT
NS IRRIG 1000ML POUR BTL (IV SOLUTION) ×3 IMPLANT
PACK FOAM VITOSS 5CC (Orthopedic Implant) ×3 IMPLANT
PACK LAMINECTOMY NEURO (CUSTOM PROCEDURE TRAY) ×3 IMPLANT
PAD ARMBOARD 7.5X6 YLW CONV (MISCELLANEOUS) ×3 IMPLANT
PATTIES SURGICAL .5 X.5 (GAUZE/BANDAGES/DRESSINGS) ×3 IMPLANT
PATTIES SURGICAL 1X1 (DISPOSABLE) ×3 IMPLANT
PEEK CORE CORP 12X25MM (Peek) ×3 IMPLANT
PEEK CORE ENDCAP 14RND 12CORE (Peek) ×6 IMPLANT
PIN DISTRACTION 14MM (PIN) ×6 IMPLANT
PLATE HELIX-T 2LVL 46MM (Plate) ×3 IMPLANT
RUBBERBAND STERILE (MISCELLANEOUS) ×6 IMPLANT
SCREW FIXED SELF TAP 4.0X13MM (Screw) ×6 IMPLANT
SCREW SELF DRILL VARIABLE 13MM (Screw) ×3 IMPLANT
SCREW VARIBLE 4.0X15 (Screw) ×3 IMPLANT
SPONGE INTESTINAL PEANUT (DISPOSABLE) ×3 IMPLANT
SPONGE SURGIFOAM ABS GEL 100 (HEMOSTASIS) IMPLANT
SUT VIC AB 0 CT1 27 (SUTURE) ×2
SUT VIC AB 0 CT1 27XBRD ANTBC (SUTURE) ×1 IMPLANT
SUT VIC AB 3-0 SH 8-18 (SUTURE) ×9 IMPLANT
SYR 20ML ECCENTRIC (SYRINGE) ×3 IMPLANT
TOWEL OR 17X24 6PK STRL BLUE (TOWEL DISPOSABLE) ×3 IMPLANT
TOWEL OR 17X26 10 PK STRL BLUE (TOWEL DISPOSABLE) ×3 IMPLANT
WATER STERILE IRR 1000ML POUR (IV SOLUTION) ×3 IMPLANT

## 2014-06-11 NOTE — Progress Notes (Signed)
Transported per Sydell Axon , NT and Macie Burows , NT

## 2014-06-11 NOTE — Anesthesia Preprocedure Evaluation (Addendum)
Anesthesia Evaluation  Patient identified by MRN, date of birth, ID band Patient awake    Reviewed: Allergy & Precautions, H&P , NPO status , Patient's Chart, lab work & pertinent test results  Airway Mallampati: II   Neck ROM: full    Dental  (+) Dental Advidsory Given   Pulmonary shortness of breath, asthma , former smoker,          Cardiovascular hypertension, + CAD, + Cardiac Stents and + Peripheral Vascular Disease     Neuro/Psych  Headaches, Depression TIA Neuromuscular disease CVA    GI/Hepatic GERD-  ,  Endo/Other  diabetes, Type 2obese  Renal/GU      Musculoskeletal  (+) Arthritis -, Fibromyalgia -  Abdominal   Peds  Hematology  (+) anemia ,   Anesthesia Other Findings   Reproductive/Obstetrics                            Anesthesia Physical Anesthesia Plan  ASA: III  Anesthesia Plan: General   Post-op Pain Management:    Induction: Intravenous  Airway Management Planned: Oral ETT  Additional Equipment: Arterial line  Intra-op Plan:   Post-operative Plan: Extubation in OR  Informed Consent: I have reviewed the patients History and Physical, chart, labs and discussed the procedure including the risks, benefits and alternatives for the proposed anesthesia with the patient or authorized representative who has indicated his/her understanding and acceptance.   Dental Advisory Given  Plan Discussed with: CRNA, Anesthesiologist and Surgeon  Anesthesia Plan Comments:        Anesthesia Quick Evaluation

## 2014-06-11 NOTE — Progress Notes (Signed)
Chaplain met pt husband in waiting area. Chaplain listened to pt husband's story. Pt husband told chaplain that he and his wife have talked about hospitalization before. Pt husband intends to drop off pt's belongings and allow her to rest once she gets in the room. Then he will visit later. Page chaplain as needed.   06/11/14 1500  Clinical Encounter Type  Visited With Family  Visit Type Initial;Spiritual support  Marcelino Scot 06/11/2014 3:32 PM

## 2014-06-11 NOTE — Op Note (Signed)
06/11/2014  2:24 PM  PATIENT:  [name]  68 y.o. female with Left upper extremity pain and spinal cord compression. She has elected to undergo operative decompression  PRE-OPERATIVE DIAGNOSIS:  cervical stenosis degenerative disk disease, cervical radiculopathy C6,C7 POST-OPERATIVE DIAGNOSIS: same   PROCEDURE:  Anterior Cervical decompression C5-7 Arthrodesis C5-7 with 24mm Peek Strut graft, autograft and allograft Anterior instrumentation(Nuvasive) C5-7 translational plate JP drain placement  SURGEON:   Surgeon(s): Ashok Pall, MD Erline Levine, MD   ASSISTANTS:Stern, [name]  ANESTHESIA:   general  EBL:  Total I/O In: 7902 [I.V.:2200; IV Piggyback:250] Out: 950 [Urine:250; Blood:700]  BLOOD ADMINISTERED:none  CELL SAVER GIVEN:none  COUNT:per nursing  DRAINS: (62mm) Jackson-Pratt drain(s) with closed bulb suction in the neck   SPECIMEN:  No Specimen  DICTATION: Mrs. Graddy was taken to the operating room, intubated, and placed under general anesthesia without difficulty. She was positioned supine with her head in slight extension on a horseshoe headrest. The neck was prepped and draped in a sterile manner. I infiltrated 5.5 cc's 1/2%lidocaine/1:200,000 strength epinephrine into the planned incision starting from the midline to the medial border of the left sternocleidomastoid muscle. I opened the incision with a 10 blade and dissected sharply through soft tissue to the platysma. I dissected in the plane superior to the platysma both rostrally and caudally. I then opened the platysma in a horizontal fashion with Metzenbaum scissors, and dissected in the inferior plane rostrally and caudally. With both blunt and sharp technique I created an avascular corridor to the cervical spine. I placed a spinal needle(s) in the disc space at 4/5 . I then reflected the longus colli from C5 to C7 and placed self retaining retractors. I opened the disc space(s) at 5/6,6/7 with a 15  blade, after removing prodigious osteophytes overlying the disc spaces. I removed disc with curettes, Kerrison punches, and the drill. Using the drill I removed osteophytes and prepared for the decompression.  I decompressed the spinal canal and the C6, and 7 root(s) with the drill, Kerrison punches, and the curettes. I used the microscope to aid in microdissection. I performed a C6 corpectomy using the drill and the Kerrison punches. I removed the posterior longitudinal ligament to fully expose and decompress the thecal sac. I exposed the roots laterally taking down the 5/6, and 6/7 uncovertebral joints. With the decompression complete I moved on to the arthrodesis. I used the drill to level the surfaces of C5, and C7. I removed soft tissue to prepare the disc space and the bony surfaces. I measured the space and placed a 34mm peek strut(nuvasive) into the disc space.  I then placed the anterior instrumentation. I placed 2 screws in each vertebral body through the plate. I locked the screws into place. Intraoperative xray showed the graft, plate, and screws to be in good position. I irrigated the wound, achieved hemostasis, and closed the wound in layers. I approximated the platysma, and the subcuticular plane with vicryl sutures. I used Dermabond for a sterile dressing.   PLAN OF CARE: Admit to inpatient   PATIENT DISPOSITION:  PACU - hemodynamically stable.   Delay start of Pharmacological VTE agent (>24hrs) due to surgical blood loss or risk of bleeding:  yes

## 2014-06-11 NOTE — H&P (Signed)
BP 174/61 mmHg  Pulse 70  Temp(Src) 98.7 F (37.1 C) (Oral)  Resp 20  Ht 5\' 4"  (1.626 m)  Wt 88.451 kg (195 lb)  BMI 33.46 kg/m2  SpO2 98% Cc: Cervical spinal cord compression [name] is a long time patient presenting with upper extremity pain, and neck pain. MRI shows cord compression No Known Allergies Prior to Admission medications   Medication Sig Start Date End Date Taking? Authorizing Provider  diclofenac sodium (VOLTAREN) 1 % GEL Apply 2 g topically 4 (four) times daily as needed (for pain).    Yes Historical Provider, MD  ergocalciferol (VITAMIN D2) 50000 UNITS capsule Take 50,000 Units by mouth once a week. On Saturday.   Yes Historical Provider, MD  furosemide (LASIX) 40 MG tablet Take 40 mg by mouth daily as needed for fluid.    Yes Historical Provider, MD  glucose blood test strip 1 each by Other route See admin instructions. Check blood sugar once daily.   Yes Historical Provider, MD  hydrALAZINE (APRESOLINE) 100 MG tablet Take 100 mg by mouth 3 (three) times daily.   Yes Historical Provider, MD  HYDROcodone-acetaminophen (NORCO) 5-325 MG per tablet Take 1-2 tablets by mouth every 6 (six) hours as needed for moderate pain or severe pain. 01/26/14  Yes Fanny Skates, MD  HYDROmorphone (DILAUDID) 2 MG tablet Take 2 mg by mouth every 6 (six) hours as needed for moderate pain or severe pain.    Yes Historical Provider, MD  insulin aspart (NOVOLOG) 100 UNIT/ML injection Inject 8 Units into the skin 2 (two) times daily.    Yes Historical Provider, MD  insulin glargine (LANTUS) 100 UNIT/ML injection Inject 28 Units into the skin daily.    Yes Historical Provider, MD  irbesartan (AVAPRO) 300 MG tablet Take 300 mg by mouth daily.   Yes Historical Provider, MD  isosorbide mononitrate (IMDUR) 60 MG 24 hr tablet Take 60 mg by mouth daily.   Yes Historical Provider, MD  latanoprost (XALATAN) 0.005 % ophthalmic solution Place 1 drop into both eyes daily.  09/26/11  Yes Historical  Provider, MD  meloxicam (MOBIC) 15 MG tablet Take 15 mg by mouth daily.  09/03/11  Yes Historical Provider, MD  metoprolol succinate (TOPROL-XL) 100 MG 24 hr tablet Take 100 mg by mouth daily. Take with or immediately following a meal.   Yes Historical Provider, MD  nitroGLYCERIN (NITROSTAT) 0.4 MG SL tablet Place 0.4 mg under the tongue every 5 (five) minutes as needed for chest pain.   Yes Historical Provider, MD  pantoprazole (PROTONIX) 40 MG tablet Take 1 tablet (40 mg total) by mouth daily. 01/28/14  Yes Laurey Morale, MD  rosuvastatin (CRESTOR) 40 MG tablet Take 40 mg by mouth daily.   Yes Historical Provider, MD  aspirin 81 MG tablet Take 81 mg by mouth daily.     Historical Provider, MD  CINNAMON PO Take 1 capsule by mouth daily.    Historical Provider, MD  metoprolol succinate (TOPROL-XL) 100 MG 24 hr tablet TAKE ONE TABLET BY MOUTH ONCE DAILY 05/14/14   Sherren Mocha, MD  terconazole (TERAZOL 3) 0.8 % vaginal cream Place 1 applicator vaginally at bedtime. For 3 nights 05/11/14   Anastasio Auerbach, MD  valACYclovir (VALTREX) 500 MG tablet Take 1 tablet (500 mg total) by mouth 2 (two) times daily. Patient not taking: Reported on 06/02/2014 05/21/14   Anastasio Auerbach, MD   Past Surgical History  Procedure Laterality Date  . Bilateral carpal tunnel release    .  Colonoscopy  05-29-11    per Dr. Henrene Pastor, benign polyps, repeat in 5 yrs    . Esophagogastroduodenoscopy  02-08-06    per Dr. Henrene Pastor, gastritis   . Glaucoma surgery      with laser, per Dr. Henrene Pastor   . Carotid endarterectomy  march 2012    per Dr. Morton Amy  . Abdominal hysterectomy  age 66    TAH and USO  Leiomyomata  . Back surgery  2008    lumb fusion  . Cholecystectomy    . Dilation and curettage of uterus    . Breast biopsy Left 01/26/2014    Procedure: EXCISION LEFT BREAST MASS;  Surgeon: Adin Hector, MD;  Location: [facility];  Service: General;  Laterality: Left;  Marland Kitchen Eye surgery    . Cardiac  catheterization    . Carpal tunnel release Left 05/14/2014    Procedure: Left Carpal tunnel release;  Surgeon: Ashok Pall, MD;  Location: [facility];  Service: Neurosurgery;  Laterality: Left;  Left Carpal tunnel release  . Ulnar nerve transposition Left 05/14/2014    Procedure: Left Ulnar nerve decompression;  Surgeon: Ashok Pall, MD;  Location: [facility];  Service: Neurosurgery;  Laterality: Left;  Left Ulnar nerve decompression   Past Medical History  Diagnosis Date  . HTN (hypertension)   . Hyperlipidemia   . TIA (transient ischemic attack)     also Hx of it.   Marland Kitchen GERD (gastroesophageal reflux disease)   . Headache(784.0)   . Anemia, unspecified   . Edema   . Depressive disorder, not elsewhere classified   . Low back pain   . Obesity   . Duodenal nodule   . Hemorrhoids   . Arthritis   . Glaucoma     narrow angle, sees Dr. Herbert Deaner   . Type II or unspecified type diabetes mellitus without mention of complication, not stated as uncontrolled     sees Dr. Carrolyn Meiers   . Gastric polyps     COLON  . Allergy   . CAD (coronary artery disease)     sees. Dr. Burt Knack. s/p cath with PCI of RCA (xience des) June 2010. Pt also with LAD and D2 dzs-NL FFR in both areas med Rx  . Peripheral vascular disease   . Stroke     TIA  "many yrs ago"    . HSV (herpes simplex virus) anogenital infection 05/2014   History   Social History  . Marital Status: Married    Spouse Name: N/A    Number of Children: N/A  . Years of Education: N/A   Occupational History  . Not on file.   Social History Main Topics  . Smoking status: Former Smoker    Quit date: 01/22/1967  . Smokeless tobacco: Never Used     Comment: non smoker  . Alcohol Use: No  . Drug Use: No  . Sexual Activity: No     Comment: HYST   Other Topics Concern  . Not on file   Social History Narrative   Family History  Problem Relation Age of Onset  . Coronary artery disease Neg Hx     premature CAD  . Colon cancer  Neg Hx   . Esophageal cancer Neg Hx   . Stomach cancer Neg Hx   . Hypertension Mother   . Hypertension Sister   . Hyperlipidemia Sister   . Hypertension Brother   . Cancer Brother     PROSTATE/LUNG  . Diabetes  Brother   . Heart disease Brother     Before age 20 - Bypass  . Varicose Veins Brother   . Cancer Father     Prostate and pancreatic    Admit for operative decompression Via a corpectomy of C6 and subsequent arthrodesis.

## 2014-06-11 NOTE — Progress Notes (Signed)
Pt arrived to unit per stretcher from PACU with PACU techs. Pt transferred to unit bed withtou difficulty. Pt tolerated well. No acute distress noted. Assessment performed as charted. Will monitor pt closely.   Angeline Slim I 06/11/2014 4:28 PM

## 2014-06-11 NOTE — Transfer of Care (Signed)
Immediate Anesthesia Transfer of Care Note  Patient: [name]  Procedure(s) Performed: Procedure(s): ANTERIOR CERVICAL CORPECTOMY CERVICAL SIX; WITH CERVICAL FIVE TO CERVICAL SEVEN ARTHRODESIS (N/A)  Patient Location: PACU  Anesthesia Type:General  Level of Consciousness: sedated  Airway & Oxygen Therapy: Patient Spontanous Breathing and Patient connected to face mask oxygen  Post-op Assessment: Report given to PACU RN, Post -op Vital signs reviewed and stable and Patient moving all extremities X 4  Post vital signs: Reviewed and stable  Complications: No apparent anesthesia complications

## 2014-06-11 NOTE — Anesthesia Procedure Notes (Signed)
Procedure Name: Intubation Date/Time: 06/11/2014 9:45 AM Performed by: Neldon Newport Pre-anesthesia Checklist: Patient identified, Timeout performed, Emergency Drugs available, Suction available and Patient being monitored Patient Re-evaluated:Patient Re-evaluated prior to inductionOxygen Delivery Method: Circle system utilized Preoxygenation: Pre-oxygenation with 100% oxygen Intubation Type: IV induction Ventilation: Mask ventilation without difficulty Laryngoscope Size: Mac and 3 Grade View: Grade I Tube type: Oral Tube size: 7.5 mm Number of attempts: 1 Placement Confirmation: positive ETCO2,  ETT inserted through vocal cords under direct vision and breath sounds checked- equal and bilateral Secured at: 21 cm Tube secured with: Tape Dental Injury: Teeth and Oropharynx as per pre-operative assessment

## 2014-06-11 NOTE — Progress Notes (Signed)
Report recd from Veryl Speak, PACU RN. Will monitor for pt's arrival to unit   Angeline Slim I 06/11/2014 4:27 PM

## 2014-06-11 NOTE — Plan of Care (Signed)
Problem: Consults Goal: Diabetes Guidelines if Diabetic/Glucose > 140 If diabetic or lab glucose is > 140 mg/dl - Initiate Diabetes/Hyperglycemia Guidelines & Document Interventions  Outcome: Progressing     

## 2014-06-11 NOTE — Anesthesia Postprocedure Evaluation (Signed)
Anesthesia Post Note  Patient: [name]  Procedure(s) Performed: Procedure(s) (LRB): ANTERIOR CERVICAL CORPECTOMY CERVICAL SIX; WITH CERVICAL FIVE TO CERVICAL SEVEN ARTHRODESIS (N/A)  Anesthesia type: General  Patient location: PACU  Post pain: Pain level controlled and Adequate analgesia  Post assessment: Post-op Vital signs reviewed, Patient's Cardiovascular Status Stable, Respiratory Function Stable, Patent Airway and Pain level controlled  Last Vitals:  Filed Vitals:   06/11/14 1531  BP:   Pulse: 69  Temp: 36.1 C  Resp: 21    Post vital signs: Reviewed and stable  Level of consciousness: awake, alert  and oriented  Complications: No apparent anesthesia complications

## 2014-06-12 LAB — HEMOGLOBIN A1C
HEMOGLOBIN A1C: 8.4 % — AB (ref <5.7)
MEAN PLASMA GLUCOSE: 194 mg/dL — AB (ref <117)

## 2014-06-12 LAB — GLUCOSE, CAPILLARY
GLUCOSE-CAPILLARY: 176 mg/dL — AB (ref 70–99)
Glucose-Capillary: 189 mg/dL — ABNORMAL HIGH (ref 70–99)
Glucose-Capillary: 197 mg/dL — ABNORMAL HIGH (ref 70–99)
Glucose-Capillary: 148 mg/dL — ABNORMAL HIGH (ref 70–99)

## 2014-06-12 MED ORDER — HYDROCODONE-ACETAMINOPHEN 5-325 MG PO TABS
ORAL_TABLET | ORAL | Status: AC
  Filled 2014-06-12: qty 2

## 2014-06-12 NOTE — Progress Notes (Signed)
Patient ID: JALENE DEMO, female   DOB: May 07, 1946, 68 y.o.   MRN: [id number] Afeb, vss No arm pain. Does not feel she is ready to go home. Will increase activity and hopefully home soon.

## 2014-06-12 NOTE — Progress Notes (Signed)
Patient ambulated to the bathroom and voided 175cc of clear urine

## 2014-06-13 LAB — GLUCOSE, CAPILLARY
Glucose-Capillary: 118 mg/dL — ABNORMAL HIGH (ref 70–99)
Glucose-Capillary: 213 mg/dL — ABNORMAL HIGH (ref 70–99)

## 2014-06-13 MED ORDER — HYDROCODONE-ACETAMINOPHEN 5-325 MG PO TABS: 1.0000 | ORAL_TABLET | ORAL | Status: DC | PRN

## 2014-06-13 NOTE — Progress Notes (Signed)
Patient [place] home via car with husband.  DC instructions and prescriptions given to patient and fully understood.  Vital signs and assessments were stable prior to discharge.

## 2014-06-13 NOTE — Progress Notes (Signed)
Utilization Review Completed.Baya Lentz T12/12/2013  

## 2014-06-13 NOTE — Discharge Summary (Signed)
Physician Discharge Summary  Patient ID: [name] MRN: [id number] DOB/AGE: Mar 26, 1946 68 y.o.  Admit date: 06/11/2014 Discharge date: 06/13/2014  Admission Diagnoses: Cervical stenosis with cord compression with myelopathy  Discharge Diagnoses:  Cervical stenosis with cord compression with myelopathy Active Problems:   Cervical spondylosis with myelopathy and radiculopathy   Discharged Condition: good  Hospital Course: C 5 - 7 decompression and fusion with C 6 corpectomy  Consults: None  Significant Diagnostic Studies: None  Treatments: surgery: C 5 - 7 decompression and fusion with C 6 corpectomy  Discharge Exam: Blood pressure 132/50, pulse 66, temperature 98.6 F (37 C), temperature source Oral, resp. rate 18, height 5\' 4"  (1.626 m), weight 88.451 kg (195 lb), SpO2 98 %. Neurologic: Alert and oriented X 3, normal strength and tone. Normal symmetric reflexes. Normal coordination and gait Wound:CDI  Disposition: Home     Medication List    TAKE these medications        aspirin 81 MG tablet  Take 81 mg by mouth daily.     CINNAMON PO  Take 1 capsule by mouth daily.     diclofenac sodium 1 % Gel  Commonly known as:  VOLTAREN  Apply 2 g topically 4 (four) times daily as needed (for pain).     ergocalciferol 50000 UNITS capsule  Commonly known as:  VITAMIN D2  Take 50,000 Units by mouth once a week. On Saturday.     furosemide 40 MG tablet  Commonly known as:  LASIX  Take 40 mg by mouth daily as needed for fluid.     glucose blood test strip  1 each by Other route See admin instructions. Check blood sugar once daily.     hydrALAZINE 100 MG tablet  Commonly known as:  APRESOLINE  Take 100 mg by mouth 3 (three) times daily.     HYDROcodone-acetaminophen 5-325 MG per tablet  Commonly known as:  NORCO  Take 1-2 tablets by mouth every 6 (six) hours as needed for moderate pain or severe pain.     HYDROcodone-acetaminophen 5-325 MG per tablet  Commonly  known as:  NORCO  Take 1-2 tablets by mouth every 4 (four) hours as needed for moderate pain or severe pain.     HYDROmorphone 2 MG tablet  Commonly known as:  DILAUDID  Take 2 mg by mouth every 6 (six) hours as needed for moderate pain or severe pain.     insulin aspart 100 UNIT/ML injection  Commonly known as:  novoLOG  Inject 8 Units into the skin 2 (two) times daily.     irbesartan 300 MG tablet  Commonly known as:  AVAPRO  Take 300 mg by mouth daily.     isosorbide mononitrate 60 MG 24 hr tablet  Commonly known as:  IMDUR  Take 60 mg by mouth daily.     LANTUS 100 UNIT/ML injection  Generic drug:  insulin glargine  Inject 28 Units into the skin daily.     latanoprost 0.005 % ophthalmic solution  Commonly known as:  XALATAN  Place 1 drop into both eyes daily.     meloxicam 15 MG tablet  Commonly known as:  MOBIC  Take 15 mg by mouth daily.     metoprolol succinate 100 MG 24 hr tablet  Commonly known as:  TOPROL-XL  Take 100 mg by mouth daily. Take with or immediately following a meal.     metoprolol succinate 100 MG 24 hr tablet  Commonly known as:  TOPROL-XL  TAKE ONE TABLET BY MOUTH ONCE DAILY     nitroGLYCERIN 0.4 MG SL tablet  Commonly known as:  NITROSTAT  Place 0.4 mg under the tongue every 5 (five) minutes as needed for chest pain.     pantoprazole 40 MG tablet  Commonly known as:  PROTONIX  Take 1 tablet (40 mg total) by mouth daily.     rosuvastatin 40 MG tablet  Commonly known as:  CRESTOR  Take 40 mg by mouth daily.     terconazole 0.8 % vaginal cream  Commonly known as:  TERAZOL 3  Place 1 applicator vaginally at bedtime. For 3 nights     valACYclovir 500 MG tablet  Commonly known as:  VALTREX  Take 1 tablet (500 mg total) by mouth 2 (two) times daily.         Signed: Peggyann Shoals, MD 06/13/2014, 1:08 PM

## 2014-06-13 NOTE — Progress Notes (Signed)
Subjective: Patient reports doing well.  Ready to go home.  Objective: Vital signs in last 24 hours: Temp:  [98 F (36.7 C)-100.2 F (37.9 C)] 98.6 F (37 C) (12/06 1035) Pulse Rate:  [64-75] 66 (12/06 1035) Resp:  [18-20] 18 (12/06 1035) BP: (132-180)/(43-60) 132/50 mmHg (12/06 1035) SpO2:  [95 %-99 %] 98 % (12/06 1035)  Intake/Output from previous day: 12/05 0701 - 12/06 0700 In: -  Out: 400 [Urine:400] Intake/Output this shift:    Physical Exam: Good strength in upper extremities.  Dressing CDI  Lab Results: No results for input(s): WBC, HGB, HCT, PLT in the last 72 hours. BMET No results for input(s): NA, K, CL, CO2, GLUCOSE, BUN, CREATININE, CALCIUM in the last 72 hours.  Studies/Results: Dg Cervical Spine 2-3 Views  06/11/2014   CLINICAL DATA:  Status post C6-T1 ACDF with C6 corpectomy  EXAM: CERVICAL SPINE - 2-3 VIEW  COMPARISON:  Cervical spine MRI of April 04, 2014  FINDINGS: A series of 3 portable intraoperative images are reviewed.  Image labeled portable #1: The uppermost metallic needle overlies the midportion of the C4-C5 disc space. The tip of the lower needle cannot be discerned. The trachea is intubated.  Image labeled portable #2: A metallic needle projects over the midportion of the C4-5 disc space. A second needle projects inferiorly to this and a third needle projects inferiorly still but their tips cannot be discerned.  Image labeled portable #3: The metallic needles have been removed. A tissues spreader device is present. There are radiodense markers projecting in the C5-C6 disc space.  IMPRESSION: Intraoperativ is ae lateral portable images from the C6-T1 ACDF with findings as described above. Limited localization information is available due to exposure factors.   Electronically Signed   By: David  Martinique   On: 06/11/2014 16:03    Assessment/Plan: Doing well.  D/C home.    LOS: 2 days    Peggyann Shoals, MD 06/13/2014, 12:56 PM

## 2014-06-14 LAB — POCT I-STAT 7, (LYTES, BLD GAS, ICA,H+H)
ACID-BASE EXCESS: 4 mmol/L — AB (ref 0.0–2.0)
BICARBONATE: 27.6 meq/L — AB (ref 20.0–24.0)
CALCIUM ION: 1.13 mmol/L (ref 1.13–1.30)
HEMATOCRIT: 28 % — AB (ref 36.0–46.0)
Hemoglobin: 9.5 g/dL — ABNORMAL LOW (ref 12.0–15.0)
O2 SAT: 100 %
PCO2 ART: 35.5 mmHg (ref 35.0–45.0)
PO2 ART: 239 mmHg — AB (ref 80.0–100.0)
POTASSIUM: 3.4 meq/L — AB (ref 3.7–5.3)
Patient temperature: 36.2
SODIUM: 138 meq/L (ref 137–147)
TCO2: 29 mmol/L (ref 0–100)
pH, Arterial: 7.497 — ABNORMAL HIGH (ref 7.350–7.450)

## 2014-06-14 LAB — POCT I-STAT 4, (NA,K, GLUC, HGB,HCT)
GLUCOSE: 175 mg/dL — AB (ref 70–99)
GLUCOSE: 188 mg/dL — AB (ref 70–99)
HCT: 29 % — ABNORMAL LOW (ref 36.0–46.0)
HEMATOCRIT: 29 % — AB (ref 36.0–46.0)
HEMOGLOBIN: 9.9 g/dL — AB (ref 12.0–15.0)
Hemoglobin: 9.9 g/dL — ABNORMAL LOW (ref 12.0–15.0)
POTASSIUM: 3.2 meq/L — AB (ref 3.7–5.3)
Potassium: 3.4 mEq/L — ABNORMAL LOW (ref 3.7–5.3)
SODIUM: 141 meq/L (ref 137–147)
Sodium: 139 mEq/L (ref 137–147)

## 2014-06-14 LAB — GLUCOSE, CAPILLARY: GLUCOSE-CAPILLARY: 194 mg/dL — AB (ref 70–99)

## 2014-06-18 ENCOUNTER — Encounter (HOSPITAL_COMMUNITY): Payer: Self-pay | Admitting: Neurosurgery

## 2014-06-22 DIAGNOSIS — Z6832 Body mass index (BMI) 32.0-32.9, adult: Secondary | ICD-10-CM | POA: Diagnosis not present

## 2014-06-22 DIAGNOSIS — M4802 Spinal stenosis, cervical region: Secondary | ICD-10-CM | POA: Diagnosis not present

## 2014-06-22 DIAGNOSIS — I10 Essential (primary) hypertension: Secondary | ICD-10-CM | POA: Diagnosis not present

## 2014-06-25 ENCOUNTER — Telehealth: Payer: Self-pay | Admitting: *Deleted

## 2014-06-25 NOTE — Telephone Encounter (Signed)
Pt called and left message stating that pt is ready to start taking hormone pill as discussed by Dr. Lona Kettle.  Pt stated she had surgery and ready to start med. Spoke with pt and was informed that pt had neck surgery 2 weeks ago, and had 4 screws in her cervical area.  Pt will f/u with her surgeon Dr. Christella Noa in 6 weeks.  Pt also had Left arm and Left hand surgery in September. Informed pt that a scheduler will contact pt with appt date and time to see Dr. Burr Medico to discuss taking homone pills.  Pt voiced understanding. Pt's  Phone   [phone].

## 2014-06-30 ENCOUNTER — Other Ambulatory Visit: Payer: Self-pay | Admitting: Cardiovascular Disease

## 2014-07-07 ENCOUNTER — Telehealth: Payer: Self-pay | Admitting: Hematology

## 2014-07-07 NOTE — Telephone Encounter (Signed)
Confirm apppt for Jan. Mailed cal.

## 2014-07-14 DIAGNOSIS — M79642 Pain in left hand: Secondary | ICD-10-CM | POA: Diagnosis not present

## 2014-07-14 DIAGNOSIS — M62542 Muscle wasting and atrophy, not elsewhere classified, left hand: Secondary | ICD-10-CM | POA: Diagnosis not present

## 2014-07-14 DIAGNOSIS — M25442 Effusion, left hand: Secondary | ICD-10-CM | POA: Diagnosis not present

## 2014-07-14 DIAGNOSIS — M79632 Pain in left forearm: Secondary | ICD-10-CM | POA: Diagnosis not present

## 2014-07-19 ENCOUNTER — Other Ambulatory Visit: Payer: Self-pay | Admitting: *Deleted

## 2014-07-19 DIAGNOSIS — N6092 Unspecified benign mammary dysplasia of left breast: Secondary | ICD-10-CM

## 2014-07-19 DIAGNOSIS — N959 Unspecified menopausal and perimenopausal disorder: Secondary | ICD-10-CM

## 2014-07-19 DIAGNOSIS — D649 Anemia, unspecified: Secondary | ICD-10-CM

## 2014-07-20 ENCOUNTER — Other Ambulatory Visit (HOSPITAL_BASED_OUTPATIENT_CLINIC_OR_DEPARTMENT_OTHER): Payer: Medicare Other

## 2014-07-20 ENCOUNTER — Ambulatory Visit (HOSPITAL_BASED_OUTPATIENT_CLINIC_OR_DEPARTMENT_OTHER): Payer: Medicare Other

## 2014-07-20 ENCOUNTER — Encounter: Payer: Self-pay | Admitting: Hematology

## 2014-07-20 ENCOUNTER — Telehealth: Payer: Self-pay | Admitting: Hematology

## 2014-07-20 ENCOUNTER — Ambulatory Visit (HOSPITAL_BASED_OUTPATIENT_CLINIC_OR_DEPARTMENT_OTHER): Payer: Medicare Other | Admitting: Hematology

## 2014-07-20 VITALS — BP 146/45 | HR 68 | Temp 98.5°F | Resp 18 | Ht 64.0 in | Wt 194.0 lb

## 2014-07-20 DIAGNOSIS — D509 Iron deficiency anemia, unspecified: Secondary | ICD-10-CM

## 2014-07-20 DIAGNOSIS — N62 Hypertrophy of breast: Secondary | ICD-10-CM

## 2014-07-20 DIAGNOSIS — D649 Anemia, unspecified: Secondary | ICD-10-CM

## 2014-07-20 DIAGNOSIS — N6092 Unspecified benign mammary dysplasia of left breast: Secondary | ICD-10-CM

## 2014-07-20 DIAGNOSIS — N959 Unspecified menopausal and perimenopausal disorder: Secondary | ICD-10-CM

## 2014-07-20 LAB — IRON AND TIBC CHCC
%SAT: 7 % — ABNORMAL LOW (ref 21–57)
Iron: 25 ug/dL — ABNORMAL LOW (ref 41–142)
TIBC: 380 ug/dL (ref 236–444)
UIBC: 355 ug/dL (ref 120–384)

## 2014-07-20 LAB — CBC WITH DIFFERENTIAL/PLATELET
BASO%: 0.6 % (ref 0.0–2.0)
Basophils Absolute: 0.1 10*3/uL (ref 0.0–0.1)
EOS%: 0.8 % (ref 0.0–7.0)
Eosinophils Absolute: 0.1 10*3/uL (ref 0.0–0.5)
HCT: 29.1 % — ABNORMAL LOW (ref 34.8–46.6)
HGB: 8.9 g/dL — ABNORMAL LOW (ref 11.6–15.9)
LYMPH%: 19.4 % (ref 14.0–49.7)
MCH: 23.4 pg — ABNORMAL LOW (ref 25.1–34.0)
MCHC: 30.5 g/dL — AB (ref 31.5–36.0)
MCV: 76.7 fL — AB (ref 79.5–101.0)
MONO#: 0.8 10*3/uL (ref 0.1–0.9)
MONO%: 7.8 % (ref 0.0–14.0)
NEUT#: 7.7 10*3/uL — ABNORMAL HIGH (ref 1.5–6.5)
NEUT%: 71.4 % (ref 38.4–76.8)
Platelets: 333 10*3/uL (ref 145–400)
RBC: 3.8 10*6/uL (ref 3.70–5.45)
RDW: 16.8 % — ABNORMAL HIGH (ref 11.2–14.5)
WBC: 10.9 10*3/uL — AB (ref 3.9–10.3)
lymph#: 2.1 10*3/uL (ref 0.9–3.3)

## 2014-07-20 LAB — RETICULOCYTES
Immature Retic Fract: 10.9 % — ABNORMAL HIGH (ref 1.60–10.00)
RBC: 3.75 10*6/uL (ref 3.70–5.45)
RETIC CT ABS: 62.25 10*3/uL (ref 33.70–90.70)
Retic %: 1.66 % (ref 0.70–2.10)

## 2014-07-20 LAB — COMPREHENSIVE METABOLIC PANEL (CC13)
ALBUMIN: 3.7 g/dL (ref 3.5–5.0)
ALK PHOS: 103 U/L (ref 40–150)
ALT: 24 U/L (ref 0–55)
AST: 16 U/L (ref 5–34)
Anion Gap: 7 mEq/L (ref 3–11)
BILIRUBIN TOTAL: 0.58 mg/dL (ref 0.20–1.20)
BUN: 15.3 mg/dL (ref 7.0–26.0)
CHLORIDE: 110 meq/L — AB (ref 98–109)
CO2: 26 mEq/L (ref 22–29)
CREATININE: 1.1 mg/dL (ref 0.6–1.1)
Calcium: 9.1 mg/dL (ref 8.4–10.4)
EGFR: 60 mL/min/{1.73_m2} — AB (ref >90)
Glucose: 175 mg/dl — ABNORMAL HIGH (ref 70–140)
Potassium: 3.5 mEq/L (ref 3.5–5.1)
Sodium: 143 mEq/L (ref 136–145)
Total Protein: 6.7 g/dL (ref 6.4–8.3)

## 2014-07-20 LAB — FERRITIN CHCC: FERRITIN: 11 ng/mL (ref 9–269)

## 2014-07-20 MED ORDER — ANASTROZOLE 1 MG PO TABS: 1.0000 mg | ORAL_TABLET | Freq: Every day | ORAL | Status: DC

## 2014-07-20 MED ORDER — FERROUS SULFATE 325 (65 FE) MG PO TBEC: 325.0000 mg | DELAYED_RELEASE_TABLET | Freq: Three times a day (TID) | ORAL | Status: DC

## 2014-07-20 NOTE — Progress Notes (Addendum)
[place] FOLLOW UP NOTE    Patient Care Team: Laurey Morale, MD as PCP - Morehead, MD as Consulting Physician (Endocrinology) Blane Ohara, MD as Consulting Physician (Cardiology) Ashok Pall, MD as Consulting Physician (Neurosurgery) Fanny Skates, MD (Surgeon) Fontaine (Gynecologist)  CHIEF COMPLAINTS:  Follow up Atypical Lobular Hyperplasia [facility])  HISTORY OF INITIAL PRESENTING ILLNESS:   [name] 69 y.o. female from [place], [name] is here because of recent diagnosis of Left breast [place] which is a precursor lesion and imparts a higher future risk for developing invasive and non-invasive breast cancers in future. She also have several comorbid conditions like Diabetes, HTN, Chronic back pain, Coronary artery disease (1 stent), TIA and fibromyalgia.  She had history of a right breast biopsy on July 27,2010 consistent with benign stromal calcifications and no malignancy. She noticed a lump in left breast this year in May 2015 as she keeps her extra money in bra and she noticed a change in her breast when she was removing money. She underwent a 3D diagnostic mammogram and an ultrasound on 11/18/2013 read as benign and there was no sonographic evidence of malignancy. Patient saw Dr Dalbert Batman in surgery after being referred by Dr Phineas Real for this palpable lump with negative imaging studies.  She then had a lumpectomy done on 01/26/2014 and the pathology from that showed [place] (Atypical Lobular Hyperplasia). There was no invasive cancer or cancer in situ and margins were negative.Pathology accession no is [id number]. She is now referred to high risk breast clinic to discuss chemoprevention strategies.she is up to date with her pap smear, colonoscopy and bone density.  In terms of breast cancer risk profile:  She menarched at early age of 76 and went to menopause at age 89 (TAH due to fibroids). She had 2 pregnancies resulting in miscarriagesy,  no biological children, 1 adopted boy.  She did not breast-fed.  She received birth control pills for couple of years and also had IUD placed.  She was never exposed to fertility medications but took HRT for few years.  She has + family history of Breast cancer. 2 maternal cousins developed breast cancer (age 58's and 61's) and her maternal aunt had breast cancer also. 1 brother with prostate cancer. 1 sister healthy as a horse. Father had prostate cancer and stomach cancer. Adopted boy is 66 years old. Mother alive at age 60 with early dementia.No ovarian cancer in family.  INTERIM HISTORY: She returns for follow up. She was previously seen by Dr. Lona Kettle, who has left the practice. She had carpal tunnel surgery on left hand on 05/14/2014, and anterior Cervical decompression C5-7 for cervical spine stenosis and cord compression on 06/11/2014. She still has significant arm and neck pain, 2-3/10, no on pain meds.     She is recovering well after the surgery. She is doing PT for left arm, and will likely do some PT for her neck also. She has good appetite and eats well. No weight loss.   MEDICAL HISTORY:  Past Medical History  Diagnosis Date  . HTN (hypertension)   . Hyperlipidemia   . TIA (transient ischemic attack)     also Hx of it.   Marland Kitchen GERD (gastroesophageal reflux disease)   . Headache(784.0)   . Anemia, unspecified   . Edema   . Depressive disorder, not elsewhere classified   . Low back pain   . Obesity   . Duodenal nodule   . Hemorrhoids   .  Arthritis   . Glaucoma     narrow angle, sees Dr. Herbert Deaner   . Type II or unspecified type diabetes mellitus without mention of complication, not stated as uncontrolled     sees Dr. Carrolyn Meiers   . Gastric polyps     COLON  . Allergy   . CAD (coronary artery disease)     sees. Dr. Burt Knack. s/p cath with PCI of RCA (xience des) June 2010. Pt also with LAD and D2 dzs-NL FFR in both areas med Rx  . Peripheral vascular disease   . Stroke     TIA   "many yrs ago"    . HSV (herpes simplex virus) anogenital infection 05/2014    SURGICAL HISTORY: Past Surgical History  Procedure Laterality Date  . Bilateral carpal tunnel release    . Colonoscopy  05-29-11    per Dr. Henrene Pastor, benign polyps, repeat in 5 yrs    . Esophagogastroduodenoscopy  02-08-06    per Dr. Henrene Pastor, gastritis   . Glaucoma surgery      with laser, per Dr. Henrene Pastor   . Carotid endarterectomy  march 2012    per Dr. Morton Amy  . Abdominal hysterectomy  age 51    TAH and USO  Leiomyomata  . Back surgery  2008    lumb fusion  . Cholecystectomy    . Dilation and curettage of uterus    . Breast biopsy Left 01/26/2014    Procedure: EXCISION LEFT BREAST MASS;  Surgeon: Adin Hector, MD;  Location: [place];  Service: General;  Laterality: Left;  Marland Kitchen Eye surgery    . Cardiac catheterization    . Carpal tunnel release Left 05/14/2014    Procedure: Left Carpal tunnel release;  Surgeon: Ashok Pall, MD;  Location: [place] NEURO ORS;  Service: Neurosurgery;  Laterality: Left;  Left Carpal tunnel release  . Ulnar nerve transposition Left 05/14/2014    Procedure: Left Ulnar nerve decompression;  Surgeon: Ashok Pall, MD;  Location: [place] NEURO ORS;  Service: Neurosurgery;  Laterality: Left;  Left Ulnar nerve decompression  . Anterior cervical corpectomy N/A 06/11/2014    Procedure: ANTERIOR CERVICAL CORPECTOMY CERVICAL SIX; WITH CERVICAL FIVE TO CERVICAL SEVEN ARTHRODESIS;  Surgeon: Ashok Pall, MD;  Location: [place] NEURO ORS;  Service: Neurosurgery;  Laterality: N/A;    SOCIAL HISTORY: History   Social History  . Marital Status: Married    Spouse Name: N/A    Number of Children: N/A  . Years of Education: N/A   Occupational History  . Not on file.   Social History Main Topics  . Smoking status: Former Smoker    Quit date: 01/22/1967  . Smokeless tobacco: Never Used     Comment: non smoker  . Alcohol Use: No  . Drug Use: No  . Sexual Activity: No      Comment: HYST   Other Topics Concern  . Not on file   Social History Narrative    FAMILY HISTORY: Family History  Problem Relation Age of Onset  . Coronary artery disease Neg Hx     premature CAD  . Colon cancer Neg Hx   . Esophageal cancer Neg Hx   . Stomach cancer Neg Hx   . Hypertension Mother   . Hypertension Sister   . Hyperlipidemia Sister   . Hypertension Brother   . Cancer Brother     PROSTATE/LUNG  . Diabetes Brother   . Heart disease Brother     Before age 57 -  Bypass  . Varicose Veins Brother   . Cancer Father     Prostate and pancreatic     ALLERGIES:  has No Known Allergies.  MEDICATIONS:  Current Outpatient Prescriptions  Medication Sig Dispense Refill  . aspirin 81 MG tablet Take 81 mg by mouth daily.     . diclofenac sodium (VOLTAREN) 1 % GEL Apply 2 g topically 4 (four) times daily as needed (for pain).     Marland Kitchen ergocalciferol (VITAMIN D2) 50000 UNITS capsule Take 50,000 Units by mouth once a week. On Saturday.    . furosemide (LASIX) 40 MG tablet Take 40 mg by mouth daily as needed for fluid.     Marland Kitchen glucose blood test strip 1 each by Other route See admin instructions. Check blood sugar once daily.    . hydrALAZINE (APRESOLINE) 100 MG tablet Take 100 mg by mouth 3 (three) times daily.    Marland Kitchen HYDROmorphone (DILAUDID) 2 MG tablet Take 2 mg by mouth every 6 (six) hours as needed for moderate pain or severe pain.     Marland Kitchen insulin aspart (NOVOLOG) 100 UNIT/ML injection Inject 8 Units into the skin 2 (two) times daily.     . insulin glargine (LANTUS) 100 UNIT/ML injection Inject 28 Units into the skin daily.     . irbesartan (AVAPRO) 300 MG tablet Take 300 mg by mouth daily.    . isosorbide mononitrate (IMDUR) 60 MG 24 hr tablet Take 60 mg by mouth daily.    . isosorbide mononitrate (IMDUR) 60 MG 24 hr tablet TAKE ONE TABLET BY MOUTH ONCE DAILY 30 tablet 7  . latanoprost (XALATAN) 0.005 % ophthalmic solution Place 1 drop into both eyes daily.     . meloxicam (MOBIC)  15 MG tablet Take 15 mg by mouth daily.     . metoprolol succinate (TOPROL-XL) 100 MG 24 hr tablet Take 100 mg by mouth daily. Take with or immediately following a meal.    . metoprolol succinate (TOPROL-XL) 100 MG 24 hr tablet TAKE ONE TABLET BY MOUTH ONCE DAILY 30 tablet 7  . nitroGLYCERIN (NITROSTAT) 0.4 MG SL tablet Place 0.4 mg under the tongue every 5 (five) minutes as needed for chest pain.    . pantoprazole (PROTONIX) 40 MG tablet Take 1 tablet (40 mg total) by mouth daily. 90 tablet 3  . rosuvastatin (CRESTOR) 40 MG tablet Take 40 mg by mouth daily.    Marland Kitchen terconazole (TERAZOL 3) 0.8 % vaginal cream Place 1 applicator vaginally at bedtime. For 3 nights 20 g 0  . anastrozole (ARIMIDEX) 1 MG tablet Take 1 tablet (1 mg total) by mouth daily. 30 tablet 5  . HYDROcodone-acetaminophen (NORCO) 5-325 MG per tablet Take 1-2 tablets by mouth every 6 (six) hours as needed for moderate pain or severe pain. (Patient not taking: Reported on 07/20/2014) 30 tablet 0   No current facility-administered medications for this visit.    Review of Systems  Constitutional: Negative for fever, chills, weight loss, malaise/fatigue and diaphoresis.  HENT: Negative for congestion, ear discharge, ear pain, hearing loss, nosebleeds, sore throat and tinnitus.   Eyes: Negative for blurred vision, double vision, photophobia, pain, discharge and redness.  Respiratory: Negative for cough, hemoptysis, sputum production, shortness of breath, wheezing and stridor.   Cardiovascular: Negative for chest pain, palpitations, orthopnea, leg swelling and PND.       +history of CAD and 1 stent.  Gastrointestinal: Negative for heartburn, nausea, vomiting, abdominal pain, diarrhea, constipation, blood in stool and melena.  Genitourinary: Negative for dysuria, urgency, frequency, hematuria and flank pain.  Musculoskeletal: Negative for myalgias, back pain and joint pain.       +fibromyalgia  Skin: Negative for itching and rash.   Neurological: Negative for dizziness, tingling, tremors, sensory change, speech change, focal weakness, seizures, loss of consciousness, weakness and headaches.       + History of TIA  Endo/Heme/Allergies: Negative for environmental allergies and polydipsia. Does not bruise/bleed easily.       +diabetes hemoglobin A1C around 8  Psychiatric/Behavioral: Negative for depression, suicidal ideas, hallucinations, memory loss and substance abuse. The patient is not nervous/anxious and does not have insomnia.      PHYSICAL EXAMINATION: ECOG PERFORMANCE STATUS: 0  Filed Vitals:   07/20/14 1032  BP: 146/45  Pulse: 68  Temp: 98.5 F (36.9 C)  Resp: 18   Filed Weights   07/20/14 1032  Weight: 194 lb (87.998 kg)    Physical Exam  Constitutional: She appears well-developed and well-nourished.  HENT:  Head: Normocephalic and atraumatic.  Nose: Nose normal.  Mouth/Throat: Oropharynx is clear and moist.  Eyes: Conjunctivae and EOM are normal. No scleral icterus.  Neck: Normal range of motion. Neck supple. No JVD present. No tracheal deviation present. No thyromegaly present.  Cardiovascular: Normal rate, regular rhythm and normal heart sounds.  Exam reveals no gallop and no friction rub.   No murmur heard. Pulmonary/Chest: Effort normal and breath sounds normal. No respiratory distress. She has no wheezes. She has no rales. Right breast exhibits no inverted nipple, no mass, no nipple discharge, no skin change and no tenderness. Left breast exhibits no inverted nipple, no mass, no nipple discharge, no skin change and no tenderness. Breasts are symmetrical. There is no breast swelling.  Abdominal: Soft. Bowel sounds are normal. She exhibits no distension. There is no tenderness. There is no rebound. No hernia.  Genitourinary: No breast bleeding.  Musculoskeletal: Normal range of motion. She exhibits no edema or tenderness.  Lymphadenopathy:    She has no cervical adenopathy.  Neurological: She  is alert. She has normal reflexes. She displays normal reflexes. No cranial nerve deficit. She exhibits normal muscle tone. Coordination normal.  Skin: Skin is warm and dry. No rash noted. No erythema. No pallor.  Psychiatric: She has a normal mood and affect. Her behavior is normal. Judgment and thought content normal.    LABORATORY DATA:  I have reviewed the data as listed Lab Results  Component Value Date   WBC 10.9* 07/20/2014   HGB 8.9* 07/20/2014   HCT 29.1* 07/20/2014   MCV 76.7* 07/20/2014   PLT 333 07/20/2014   Lab Results  Component Value Date   NA 143 07/20/2014   K 3.5 07/20/2014   CL 103 06/02/2014   CO2 26 07/20/2014    RADIOGRAPHIC STUDIES: I have personally reviewed the radiological images as listed and agreed with the findings in the report.   ASSESSMENT:   69 years old female with diagnosis of ALH (Atypical Lobular Hyperplasia) seen on a Lumpectomy of left breast. This was done for a palpable lump which was negative on mammogram and ultrasound. She had previously a right breast biopsy which was also negative for malignancy.   1. Left breast ALH  -Dr. Lona Kettle has previously extensively discussed the risks of developing breast cancer and chemoprevention with tamoxifen versus anastrozole. The overall benefit of chemoprevention is reduce her breast cancer by 50% with absolute benefit 3.5-4% risk reduction.  -Potential side effects of tamoxifen and Arimidex  were reviewed with patient again today. She opted to use Arimidex. -I would like to her have edited recovery from her recent spinal surgery. I did send a prescription of Arimidex her pharmacy, but told her not to start until she recovers, likely in one month.  2. Microcytic anemia  -she has developed moderate microcytic anemia in the past 3-4 month, ferritin 11, low iron and sat, supports iron deficient anemia. Possible related to her recent surgery. -I recommend ferrous sulfate 325 mg 2-3 times a day.  Prescription sent to her pharmacy. Constipation reviewed with patient. -Repeat her iron study on next visit  Plan 1. Start Arimidex  In one month when you recover better from recent surgeries  2. RTC in 2 month for follow up  Truitt Merle 07/20/2014

## 2014-07-20 NOTE — Telephone Encounter (Signed)
Gave avs & cal for March. PT states 37month RV. No POF sent.

## 2014-07-20 NOTE — Addendum Note (Signed)
Addended by: Truitt Merle on: 07/20/2014 09:31 PM   Modules accepted: Orders

## 2014-07-21 ENCOUNTER — Telehealth: Payer: Self-pay | Admitting: *Deleted

## 2014-07-21 NOTE — Telephone Encounter (Signed)
Called pt this am unsuccessfully.  Left message on voice mail requesting a call back. Not heard from pt.  Called pt again this pm at [facility].   Left message on voice mail requesting a call back.

## 2014-07-22 ENCOUNTER — Telehealth: Payer: Self-pay | Admitting: Hematology

## 2014-07-22 ENCOUNTER — Telehealth: Payer: Self-pay | Admitting: *Deleted

## 2014-07-22 ENCOUNTER — Other Ambulatory Visit: Payer: Self-pay | Admitting: *Deleted

## 2014-07-22 NOTE — Telephone Encounter (Signed)
, °

## 2014-07-22 NOTE — Telephone Encounter (Signed)
Spoke with pt today and informed pt re:  Ferritin level low 11.  Pt needs to take Ferrous sulfate 325mg  TID as per Dr. Burr Medico.  Pt voiced understanding.

## 2014-08-02 DIAGNOSIS — M79642 Pain in left hand: Secondary | ICD-10-CM | POA: Diagnosis not present

## 2014-08-02 DIAGNOSIS — M62542 Muscle wasting and atrophy, not elsewhere classified, left hand: Secondary | ICD-10-CM | POA: Diagnosis not present

## 2014-08-02 DIAGNOSIS — M25442 Effusion, left hand: Secondary | ICD-10-CM | POA: Diagnosis not present

## 2014-08-02 DIAGNOSIS — M79632 Pain in left forearm: Secondary | ICD-10-CM | POA: Diagnosis not present

## 2014-08-04 DIAGNOSIS — M25442 Effusion, left hand: Secondary | ICD-10-CM | POA: Diagnosis not present

## 2014-08-04 DIAGNOSIS — M79632 Pain in left forearm: Secondary | ICD-10-CM | POA: Diagnosis not present

## 2014-08-04 DIAGNOSIS — M79642 Pain in left hand: Secondary | ICD-10-CM | POA: Diagnosis not present

## 2014-08-04 DIAGNOSIS — M62542 Muscle wasting and atrophy, not elsewhere classified, left hand: Secondary | ICD-10-CM | POA: Diagnosis not present

## 2014-08-09 DIAGNOSIS — M79642 Pain in left hand: Secondary | ICD-10-CM | POA: Diagnosis not present

## 2014-08-09 DIAGNOSIS — M62542 Muscle wasting and atrophy, not elsewhere classified, left hand: Secondary | ICD-10-CM | POA: Diagnosis not present

## 2014-08-09 DIAGNOSIS — M25442 Effusion, left hand: Secondary | ICD-10-CM | POA: Diagnosis not present

## 2014-08-09 DIAGNOSIS — M79632 Pain in left forearm: Secondary | ICD-10-CM | POA: Diagnosis not present

## 2014-08-11 DIAGNOSIS — M79632 Pain in left forearm: Secondary | ICD-10-CM | POA: Diagnosis not present

## 2014-08-11 DIAGNOSIS — M79642 Pain in left hand: Secondary | ICD-10-CM | POA: Diagnosis not present

## 2014-08-11 DIAGNOSIS — M62542 Muscle wasting and atrophy, not elsewhere classified, left hand: Secondary | ICD-10-CM | POA: Diagnosis not present

## 2014-08-11 DIAGNOSIS — M25442 Effusion, left hand: Secondary | ICD-10-CM | POA: Diagnosis not present

## 2014-08-16 DIAGNOSIS — M79642 Pain in left hand: Secondary | ICD-10-CM | POA: Diagnosis not present

## 2014-08-16 DIAGNOSIS — M25442 Effusion, left hand: Secondary | ICD-10-CM | POA: Diagnosis not present

## 2014-08-16 DIAGNOSIS — M62542 Muscle wasting and atrophy, not elsewhere classified, left hand: Secondary | ICD-10-CM | POA: Diagnosis not present

## 2014-08-16 DIAGNOSIS — M79632 Pain in left forearm: Secondary | ICD-10-CM | POA: Diagnosis not present

## 2014-08-18 ENCOUNTER — Other Ambulatory Visit (HOSPITAL_BASED_OUTPATIENT_CLINIC_OR_DEPARTMENT_OTHER): Payer: Medicare Other

## 2014-08-18 DIAGNOSIS — D509 Iron deficiency anemia, unspecified: Secondary | ICD-10-CM | POA: Diagnosis not present

## 2014-08-18 DIAGNOSIS — D649 Anemia, unspecified: Secondary | ICD-10-CM

## 2014-08-18 LAB — COMPREHENSIVE METABOLIC PANEL (CC13)
ALT: 18 U/L (ref 0–55)
ANION GAP: 7 meq/L (ref 3–11)
AST: 24 U/L (ref 5–34)
Albumin: 3.8 g/dL (ref 3.5–5.0)
Alkaline Phosphatase: 88 U/L (ref 40–150)
BUN: 14.1 mg/dL (ref 7.0–26.0)
CALCIUM: 9.3 mg/dL (ref 8.4–10.4)
CO2: 26 mEq/L (ref 22–29)
CREATININE: 1.1 mg/dL (ref 0.6–1.1)
Chloride: 109 mEq/L (ref 98–109)
EGFR: 62 mL/min/{1.73_m2} — AB (ref >90)
GLUCOSE: 153 mg/dL — AB (ref 70–140)
POTASSIUM: 3.8 meq/L (ref 3.5–5.1)
Sodium: 142 mEq/L (ref 136–145)
Total Bilirubin: 0.54 mg/dL (ref 0.20–1.20)
Total Protein: 6.9 g/dL (ref 6.4–8.3)

## 2014-08-18 LAB — CBC & DIFF AND RETIC
BASO%: 0.3 % (ref 0.0–2.0)
BASOS ABS: 0 10*3/uL (ref 0.0–0.1)
EOS%: 1.1 % (ref 0.0–7.0)
Eosinophils Absolute: 0.1 10*3/uL (ref 0.0–0.5)
HEMATOCRIT: 36.4 % (ref 34.8–46.6)
HEMOGLOBIN: 10.7 g/dL — AB (ref 11.6–15.9)
IMMATURE RETIC FRACT: 10.5 % — AB (ref 1.60–10.00)
LYMPH%: 25 % (ref 14.0–49.7)
MCH: 23.7 pg — ABNORMAL LOW (ref 25.1–34.0)
MCHC: 29.4 g/dL — ABNORMAL LOW (ref 31.5–36.0)
MCV: 80.7 fL (ref 79.5–101.0)
MONO#: 0.5 10*3/uL (ref 0.1–0.9)
MONO%: 7.2 % (ref 0.0–14.0)
NEUT#: 4.9 10*3/uL (ref 1.5–6.5)
NEUT%: 66.4 % (ref 38.4–76.8)
Platelets: 203 10*3/uL (ref 145–400)
RBC: 4.51 10*6/uL (ref 3.70–5.45)
RDW: 19.4 % — ABNORMAL HIGH (ref 11.2–14.5)
RETIC %: 0.98 % (ref 0.70–2.10)
RETIC CT ABS: 44.2 10*3/uL (ref 33.70–90.70)
WBC: 7.4 10*3/uL (ref 3.9–10.3)
lymph#: 1.8 10*3/uL (ref 0.9–3.3)

## 2014-08-18 LAB — IRON AND TIBC CHCC
%SAT: 54 % (ref 21–57)
Iron: 177 ug/dL — ABNORMAL HIGH (ref 41–142)
TIBC: 328 ug/dL (ref 236–444)
UIBC: 150 ug/dL (ref 120–384)

## 2014-08-18 LAB — FERRITIN CHCC: FERRITIN: 22 ng/mL (ref 9–269)

## 2014-08-19 DIAGNOSIS — M25442 Effusion, left hand: Secondary | ICD-10-CM | POA: Diagnosis not present

## 2014-08-19 DIAGNOSIS — M79642 Pain in left hand: Secondary | ICD-10-CM | POA: Diagnosis not present

## 2014-08-19 DIAGNOSIS — M62542 Muscle wasting and atrophy, not elsewhere classified, left hand: Secondary | ICD-10-CM | POA: Diagnosis not present

## 2014-08-19 DIAGNOSIS — M79632 Pain in left forearm: Secondary | ICD-10-CM | POA: Diagnosis not present

## 2014-08-25 DIAGNOSIS — M62542 Muscle wasting and atrophy, not elsewhere classified, left hand: Secondary | ICD-10-CM | POA: Diagnosis not present

## 2014-08-25 DIAGNOSIS — M79642 Pain in left hand: Secondary | ICD-10-CM | POA: Diagnosis not present

## 2014-08-25 DIAGNOSIS — M79632 Pain in left forearm: Secondary | ICD-10-CM | POA: Diagnosis not present

## 2014-08-25 DIAGNOSIS — M25442 Effusion, left hand: Secondary | ICD-10-CM | POA: Diagnosis not present

## 2014-08-30 DIAGNOSIS — M25442 Effusion, left hand: Secondary | ICD-10-CM | POA: Diagnosis not present

## 2014-08-30 DIAGNOSIS — M79642 Pain in left hand: Secondary | ICD-10-CM | POA: Diagnosis not present

## 2014-08-30 DIAGNOSIS — M79632 Pain in left forearm: Secondary | ICD-10-CM | POA: Diagnosis not present

## 2014-08-30 DIAGNOSIS — M62542 Muscle wasting and atrophy, not elsewhere classified, left hand: Secondary | ICD-10-CM | POA: Diagnosis not present

## 2014-09-01 DIAGNOSIS — E559 Vitamin D deficiency, unspecified: Secondary | ICD-10-CM | POA: Diagnosis not present

## 2014-09-01 DIAGNOSIS — Z6831 Body mass index (BMI) 31.0-31.9, adult: Secondary | ICD-10-CM | POA: Diagnosis not present

## 2014-09-01 DIAGNOSIS — E785 Hyperlipidemia, unspecified: Secondary | ICD-10-CM | POA: Diagnosis not present

## 2014-09-01 DIAGNOSIS — I10 Essential (primary) hypertension: Secondary | ICD-10-CM | POA: Diagnosis not present

## 2014-09-01 DIAGNOSIS — D649 Anemia, unspecified: Secondary | ICD-10-CM | POA: Diagnosis not present

## 2014-09-01 DIAGNOSIS — I6523 Occlusion and stenosis of bilateral carotid arteries: Secondary | ICD-10-CM | POA: Diagnosis not present

## 2014-09-01 DIAGNOSIS — D860 Sarcoidosis of lung: Secondary | ICD-10-CM | POA: Diagnosis not present

## 2014-09-01 DIAGNOSIS — N62 Hypertrophy of breast: Secondary | ICD-10-CM | POA: Diagnosis not present

## 2014-09-01 DIAGNOSIS — I2510 Atherosclerotic heart disease of native coronary artery without angina pectoris: Secondary | ICD-10-CM | POA: Diagnosis not present

## 2014-09-01 DIAGNOSIS — E1159 Type 2 diabetes mellitus with other circulatory complications: Secondary | ICD-10-CM | POA: Diagnosis not present

## 2014-09-14 ENCOUNTER — Other Ambulatory Visit: Payer: Medicare Other

## 2014-09-14 ENCOUNTER — Telehealth: Payer: Self-pay | Admitting: Hematology

## 2014-09-14 ENCOUNTER — Ambulatory Visit: Payer: Medicare Other | Admitting: Hematology

## 2014-09-14 ENCOUNTER — Other Ambulatory Visit: Payer: Self-pay | Admitting: *Deleted

## 2014-09-14 DIAGNOSIS — G5602 Carpal tunnel syndrome, left upper limb: Secondary | ICD-10-CM | POA: Diagnosis not present

## 2014-09-14 DIAGNOSIS — M4712 Other spondylosis with myelopathy, cervical region: Secondary | ICD-10-CM | POA: Diagnosis not present

## 2014-09-14 DIAGNOSIS — Z6833 Body mass index (BMI) 33.0-33.9, adult: Secondary | ICD-10-CM | POA: Diagnosis not present

## 2014-09-14 DIAGNOSIS — G5622 Lesion of ulnar nerve, left upper limb: Secondary | ICD-10-CM | POA: Diagnosis not present

## 2014-09-14 NOTE — Telephone Encounter (Signed)
Confirm appointment for April

## 2014-09-16 DIAGNOSIS — M1711 Unilateral primary osteoarthritis, right knee: Secondary | ICD-10-CM | POA: Diagnosis not present

## 2014-09-23 ENCOUNTER — Ambulatory Visit: Payer: Medicare Other

## 2014-09-23 ENCOUNTER — Other Ambulatory Visit: Payer: Medicare Other

## 2014-09-28 DIAGNOSIS — M545 Low back pain: Secondary | ICD-10-CM | POA: Diagnosis not present

## 2014-09-28 DIAGNOSIS — Z6832 Body mass index (BMI) 32.0-32.9, adult: Secondary | ICD-10-CM | POA: Diagnosis not present

## 2014-09-29 ENCOUNTER — Other Ambulatory Visit: Payer: Self-pay | Admitting: Neurosurgery

## 2014-09-29 DIAGNOSIS — M545 Low back pain: Secondary | ICD-10-CM

## 2014-10-07 ENCOUNTER — Other Ambulatory Visit: Payer: PRIVATE HEALTH INSURANCE

## 2014-10-07 DIAGNOSIS — H402233 Chronic angle-closure glaucoma, bilateral, severe stage: Secondary | ICD-10-CM | POA: Diagnosis not present

## 2014-10-11 ENCOUNTER — Telehealth: Payer: Self-pay | Admitting: Hematology

## 2014-10-11 ENCOUNTER — Ambulatory Visit (HOSPITAL_BASED_OUTPATIENT_CLINIC_OR_DEPARTMENT_OTHER): Payer: Medicare Other | Admitting: Hematology

## 2014-10-11 ENCOUNTER — Telehealth: Payer: Self-pay | Admitting: *Deleted

## 2014-10-11 ENCOUNTER — Other Ambulatory Visit (HOSPITAL_BASED_OUTPATIENT_CLINIC_OR_DEPARTMENT_OTHER): Payer: Medicare Other

## 2014-10-11 VITALS — BP 180/58 | HR 83 | Temp 98.8°F | Resp 18 | Ht 64.0 in | Wt 190.9 lb

## 2014-10-11 DIAGNOSIS — D509 Iron deficiency anemia, unspecified: Secondary | ICD-10-CM

## 2014-10-11 DIAGNOSIS — D500 Iron deficiency anemia secondary to blood loss (chronic): Secondary | ICD-10-CM

## 2014-10-11 DIAGNOSIS — D649 Anemia, unspecified: Secondary | ICD-10-CM

## 2014-10-11 DIAGNOSIS — N62 Hypertrophy of breast: Secondary | ICD-10-CM

## 2014-10-11 DIAGNOSIS — M545 Low back pain: Secondary | ICD-10-CM

## 2014-10-11 LAB — IRON AND TIBC CHCC
%SAT: 27 % (ref 21–57)
IRON: 94 ug/dL (ref 41–142)
TIBC: 350 ug/dL (ref 236–444)
UIBC: 256 ug/dL (ref 120–384)

## 2014-10-11 LAB — CBC & DIFF AND RETIC
BASO%: 0.4 % (ref 0.0–2.0)
BASOS ABS: 0 10*3/uL (ref 0.0–0.1)
EOS%: 2 % (ref 0.0–7.0)
Eosinophils Absolute: 0.2 10*3/uL (ref 0.0–0.5)
HCT: 39.8 % (ref 34.8–46.6)
HGB: 12.4 g/dL (ref 11.6–15.9)
Immature Retic Fract: 4.5 % (ref 1.60–10.00)
LYMPH%: 30.2 % (ref 14.0–49.7)
MCH: 24.4 pg — AB (ref 25.1–34.0)
MCHC: 31.2 g/dL — AB (ref 31.5–36.0)
MCV: 78.3 fL — ABNORMAL LOW (ref 79.5–101.0)
MONO#: 0.5 10*3/uL (ref 0.1–0.9)
MONO%: 5.6 % (ref 0.0–14.0)
NEUT#: 4.9 10*3/uL (ref 1.5–6.5)
NEUT%: 61.8 % (ref 38.4–76.8)
PLATELETS: 203 10*3/uL (ref 145–400)
RBC: 5.08 10*6/uL (ref 3.70–5.45)
RDW: 21.4 % — ABNORMAL HIGH (ref 11.2–14.5)
RETIC CT ABS: 31.5 10*3/uL — AB (ref 33.70–90.70)
Retic %: 0.62 % — ABNORMAL LOW (ref 0.70–2.10)
WBC: 8 10*3/uL (ref 3.9–10.3)
lymph#: 2.4 10*3/uL (ref 0.9–3.3)

## 2014-10-11 LAB — COMPREHENSIVE METABOLIC PANEL (CC13)
ALK PHOS: 86 U/L (ref 40–150)
ALT: 18 U/L (ref 0–55)
ANION GAP: 12 meq/L — AB (ref 3–11)
AST: 18 U/L (ref 5–34)
Albumin: 3.8 g/dL (ref 3.5–5.0)
BUN: 16.5 mg/dL (ref 7.0–26.0)
CALCIUM: 9.4 mg/dL (ref 8.4–10.4)
CO2: 27 meq/L (ref 22–29)
Chloride: 105 mEq/L (ref 98–109)
Creatinine: 1 mg/dL (ref 0.6–1.1)
EGFR: 65 mL/min/{1.73_m2} — AB (ref >90)
Glucose: 170 mg/dl — ABNORMAL HIGH (ref 70–140)
Potassium: 3.6 mEq/L (ref 3.5–5.1)
SODIUM: 144 meq/L (ref 136–145)
TOTAL PROTEIN: 7.1 g/dL (ref 6.4–8.3)
Total Bilirubin: 0.44 mg/dL (ref 0.20–1.20)

## 2014-10-11 LAB — FERRITIN CHCC: Ferritin: 27 ng/ml (ref 9–269)

## 2014-10-11 NOTE — Telephone Encounter (Signed)
Gave avs & calendar for May. Sent message to schedule treatment. °

## 2014-10-11 NOTE — Telephone Encounter (Signed)
Per staff message and POF I have scheduled appts. Advised scheduler of appts. JMW  

## 2014-10-11 NOTE — Progress Notes (Signed)
[place] FOLLOW UP NOTE    Patient Care Team: Laurey Morale, MD as PCP - [place], MD as Consulting Physician (Endocrinology) Sherren Mocha, MD as Consulting Physician (Cardiology) Ashok Pall, MD as Consulting Physician (Neurosurgery) Fanny Skates, MD (Surgeon) Fontaine (Gynecologist)  CHIEF COMPLAINTS:  Follow up Atypical Lobular Hyperplasia [facility])  HISTORY OF INITIAL PRESENTING ILLNESS:   [name] 69 y.o. female from [place], [name] is here because of recent diagnosis of Left breast [place] which is a precursor lesion and imparts a higher future risk for developing invasive and non-invasive breast cancers in future. She also have several comorbid conditions like Diabetes, HTN, Chronic back pain, Coronary artery disease (1 stent), TIA and fibromyalgia.  She had history of a right breast biopsy on July 27,2010 consistent with benign stromal calcifications and no malignancy. She noticed a lump in left breast this year in May 2015 as she keeps her extra money in bra and she noticed a change in her breast when she was removing money. She underwent a 3D diagnostic mammogram and an ultrasound on 11/18/2013 read as benign and there was no sonographic evidence of malignancy. Patient saw Dr Dalbert Batman in surgery after being referred by Dr Phineas Real for this palpable lump with negative imaging studies.  She then had a lumpectomy done on 01/26/2014 and the pathology from that showed [place] (Atypical Lobular Hyperplasia). There was no invasive cancer or cancer in situ and margins were negative.Pathology accession no is [id number]. She is now referred to high risk breast clinic to discuss chemoprevention strategies.she is up to date with her pap smear, colonoscopy and bone density.  In terms of breast cancer risk profile:  She menarched at early age of 69 and went to menopause at age 62 (TAH due to fibroids). She had 2 pregnancies resulting in miscarriagesy, no  biological children, 1 adopted boy.  She did not breast-fed.  She received birth control pills for couple of years and also had IUD placed.  She was never exposed to fertility medications but took HRT for few years.  She has + family history of Breast cancer. 2 maternal cousins developed breast cancer (age 41's and 77's) and her maternal aunt had breast cancer also. 1 brother with prostate cancer. 1 sister healthy as a horse. Father had prostate cancer and stomach cancer. Adopted boy is 15 years old. Mother alive at age 70 with early dementia.No ovarian cancer in family.  INTERIM HISTORY: She returns for follow up. She had an accident of fall on 3/7 and has had back pain since then. She was seen by a neurosurgeon and is scheduled to have a lumbar spine MRI in a few weeks. She had cervical spine surgery last Oct. She has not started Arimidex yet. The 10 point review of system otherwise is negative.    MEDICAL HISTORY:  Past Medical History  Diagnosis Date  . HTN (hypertension)   . Hyperlipidemia   . TIA (transient ischemic attack)     also Hx of it.   Marland Kitchen GERD (gastroesophageal reflux disease)   . Headache(784.0)   . Anemia, unspecified   . Edema   . Depressive disorder, not elsewhere classified   . Low back pain   . Obesity   . Duodenal nodule   . Hemorrhoids   . Arthritis   . Glaucoma     narrow angle, sees Dr. Herbert Deaner   . Type II or unspecified type diabetes mellitus without mention of complication, not stated as  uncontrolled     sees Dr. Carrolyn Meiers   . Gastric polyps     COLON  . Allergy   . CAD (coronary artery disease)     sees. Dr. Burt Knack. s/p cath with PCI of RCA (xience des) June 2010. Pt also with LAD and D2 dzs-NL FFR in both areas med Rx  . Peripheral vascular disease   . Stroke     TIA  "many yrs ago"    . HSV (herpes simplex virus) anogenital infection 05/2014    SURGICAL HISTORY: Past Surgical History  Procedure Laterality Date  . Bilateral carpal tunnel  release    . Colonoscopy  05-29-11    per Dr. Henrene Pastor, benign polyps, repeat in 5 yrs    . Esophagogastroduodenoscopy  02-08-06    per Dr. Henrene Pastor, gastritis   . Glaucoma surgery      with laser, per Dr. Henrene Pastor   . Carotid endarterectomy  march 2012    per Dr. Morton Amy  . Abdominal hysterectomy  age 32    TAH and USO  Leiomyomata  . Back surgery  2008    lumb fusion  . Cholecystectomy    . Dilation and curettage of uterus    . Breast biopsy Left 01/26/2014    Procedure: EXCISION LEFT BREAST MASS;  Surgeon: Adin Hector, MD;  Location: [facility];  Service: General;  Laterality: Left;  Marland Kitchen Eye surgery    . Cardiac catheterization    . Carpal tunnel release Left 05/14/2014    Procedure: Left Carpal tunnel release;  Surgeon: Ashok Pall, MD;  Location: [facility]URO ORS;  Service: Neurosurgery;  Laterality: Left;  Left Carpal tunnel release  . Ulnar nerve transposition Left 05/14/2014    Procedure: Left Ulnar nerve decompression;  Surgeon: Ashok Pall, MD;  Location: [facility] NEURO ORS;  Service: Neurosurgery;  Laterality: Left;  Left Ulnar nerve decompression  . Anterior cervical corpectomy N/A 06/11/2014    Procedure: ANTERIOR CERVICAL CORPECTOMY CERVICAL SIX; WITH CERVICAL FIVE TO CERVICAL SEVEN ARTHRODESIS;  Surgeon: Ashok Pall, MD;  Location: [facility]UR[facility]S;  Service: Neurosurgery;  Laterality: N/A;    SOCIAL HISTORY: History   Social History  . Marital Status: Married    Spouse Name: N/A  . Number of Children: N/A  . Years of Education: N/A   Occupational History  . Not on file.   Social History Main Topics  . Smoking status: Former Smoker    Quit date: 01/22/1967  . Smokeless tobacco: Never Used     Comment: non smoker  . Alcohol Use: No  . Drug Use: No  . Sexual Activity: No     Comment: HYST   Other Topics Concern  . Not on file   Social History Narrative    FAMILY HISTORY: Family History  Problem Relation Age of Onset  . Coronary artery disease  Neg Hx     premature CAD  . Colon cancer Neg Hx   . Esophageal cancer Neg Hx   . Stomach cancer Neg Hx   . Hypertension Mother   . Hypertension Sister   . Hyperlipidemia Sister   . Hypertension Brother   . Cancer Brother     PROSTATE/LUNG  . Diabetes Brother   . Heart disease Brother     Before age 29 - Bypass  . Varicose Veins Brother   . Cancer Father     Prostate and pancreatic     ALLERGIES:  has No Known Allergies.  MEDICATIONS:  Current Outpatient Prescriptions  Medication Sig Dispense Refill  . anastrozole (ARIMIDEX) 1 MG tablet Take 1 tablet (1 mg total) by mouth daily. 30 tablet 5  . aspirin 81 MG tablet Take 81 mg by mouth daily.     . diclofenac sodium (VOLTAREN) 1 % GEL Apply 2 g topically 4 (four) times daily as needed (for pain).     Marland Kitchen ergocalciferol (VITAMIN D2) 50000 UNITS capsule Take 50,000 Units by mouth once a week. On Saturday.    . ferrous sulfate 325 (65 FE) MG EC tablet Take 1 tablet (325 mg total) by mouth 3 (three) times daily with meals. 90 tablet 3  . furosemide (LASIX) 40 MG tablet Take 40 mg by mouth daily as needed for fluid.     Marland Kitchen glucose blood test strip 1 each by Other route See admin instructions. Check blood sugar once daily.    . hydrALAZINE (APRESOLINE) 100 MG tablet Take 100 mg by mouth 3 (three) times daily.    Marland Kitchen HYDROcodone-acetaminophen (NORCO) 5-325 MG per tablet Take 1-2 tablets by mouth every 6 (six) hours as needed for moderate pain or severe pain. (Patient not taking: Reported on 07/20/2014) 30 tablet 0  . insulin aspart (NOVOLOG) 100 UNIT/ML injection Inject 8 Units into the skin 2 (two) times daily.     . insulin glargine (LANTUS) 100 UNIT/ML injection Inject 28 Units into the skin daily.     . irbesartan (AVAPRO) 300 MG tablet Take 300 mg by mouth daily.    . isosorbide mononitrate (IMDUR) 60 MG 24 hr tablet Take 60 mg by mouth daily.    Marland Kitchen latanoprost (XALATAN) 0.005 % ophthalmic solution Place 1 drop into both eyes daily.     .  meloxicam (MOBIC) 15 MG tablet Take 15 mg by mouth daily.     . metoprolol succinate (TOPROL-XL) 100 MG 24 hr tablet Take 100 mg by mouth daily. Take with or immediately following a meal.    . nitroGLYCERIN (NITROSTAT) 0.4 MG SL tablet Place 0.4 mg under the tongue every 5 (five) minutes as needed for chest pain.    . pantoprazole (PROTONIX) 40 MG tablet Take 1 tablet (40 mg total) by mouth daily. 90 tablet 3  . rosuvastatin (CRESTOR) 40 MG tablet Take 40 mg by mouth daily.    Marland Kitchen terconazole (TERAZOL 3) 0.8 % vaginal cream Place 1 applicator vaginally at bedtime. For 3 nights 20 g 0   No current facility-administered medications for this visit.    ROS   PHYSICAL EXAMINATION: ECOG PERFORMANCE STATUS: 0  Filed Vitals:   10/11/14 1034  BP: 180/58  Pulse: 83  Temp: 98.8 F (37.1 C)  Resp: 18   Filed Weights   10/11/14 1034  Weight: 190 lb 14.4 oz (86.592 kg)    Physical Exam  Constitutional: She appears well-developed and well-nourished.  HENT:  Head: Normocephalic and atraumatic.  Nose: Nose normal.  Mouth/Throat: Oropharynx is clear and moist.  Eyes: Conjunctivae and EOM are normal. No scleral icterus.  Neck: Normal range of motion. Neck supple. No JVD present. No tracheal deviation present. No thyromegaly present.  Cardiovascular: Normal rate, regular rhythm and normal heart sounds.  Exam reveals no gallop and no friction rub.   No murmur heard. Pulmonary/Chest: Effort normal and breath sounds normal. No respiratory distress. She has no wheezes. She has no rales. Right breast exhibits no inverted nipple, no mass, no nipple discharge, no skin change and no tenderness. Left breast exhibits no inverted nipple, no  mass, no nipple discharge, no skin change and no tenderness. Breasts are symmetrical. There is no breast swelling.  Abdominal: Soft. Bowel sounds are normal. She exhibits no distension. There is no tenderness. There is no rebound. No hernia.  Genitourinary: No breast  bleeding.  Musculoskeletal: Normal range of motion. She exhibits no edema or tenderness.  Lymphadenopathy:    She has no cervical adenopathy.  Neurological: She is alert. She has normal reflexes. She displays normal reflexes. No cranial nerve deficit. She exhibits normal muscle tone. Coordination normal.  Skin: Skin is warm and dry. No rash noted. No erythema. No pallor.  Psychiatric: She has a normal mood and affect. Her behavior is normal. Judgment and thought content normal.    LABORATORY DATA:  CBC Latest Ref Rng 10/11/2014 08/18/2014 07/20/2014  WBC 3.9 - 10.3 10e3/uL 8.0 7.4 10.9(H)  Hemoglobin 11.6 - 15.9 g/dL 12.4 10.7(L) 8.9(L)  Hematocrit 34.8 - 46.6 % 39.8 36.4 29.1(L)  Platelets 145 - 400 10e3/uL 203 203 Large & giant platelets 333    CMP Latest Ref Rng 08/18/2014 07/20/2014 06/11/2014  Glucose 70 - 140 mg/dl 153(H) 175(H) -  BUN 7.0 - 26.0 mg/dL 14.1 15.3 -  Creatinine 0.6 - 1.1 mg/dL 1.1 1.1 -  Sodium 136 - 145 mEq/L 142 143 138  Potassium 3.5 - 5.1 mEq/L 3.8 3.5 3.4(L)  Chloride 96 - 112 mEq/L - - -  CO2 22 - 29 mEq/L 26 26 -  Calcium 8.4 - 10.4 mg/dL 9.3 9.1 -  Total Protein 6.4 - 8.3 g/dL 6.9 6.7 -  Total Bilirubin 0.20 - 1.20 mg/dL 0.54 0.58 -  Alkaline Phos 40 - 150 U/L 88 103 -  AST 5 - 34 U/L 24 16 -  ALT 0 - 55 U/L 18 24 -     RADIOGRAPHIC STUDIES: I have personally reviewed the radiological images as listed and agreed with the findings in the report.   ASSESSMENT:   69 years old female with diagnosis of ALH (Atypical Lobular Hyperplasia) seen on a Lumpectomy of left breast. This was done for a palpable lump which was negative on mammogram and ultrasound. She had previously a right breast biopsy which was also negative for malignancy.   1. Left breast ALH  -Dr. Lona Kettle has previously extensively discussed the risks of developing breast cancer and chemoprevention with tamoxifen versus anastrozole. The overall benefit of chemoprevention is reduce her breast  cancer by 50% with absolute benefit 3.5-4% risk reduction. She agreed to take chemoprevention  -Potential side effects of tamoxifen and Arimidex were reviewed with patient again today. She opted to take Arimidex. -I have previously sent a prescription of Arimidex to her pharmacy, but she has not started to have. Benefit and side effects were reviewed with her and can, she agrees to start next week.   2. Microcytic anemia  -she has developed moderate microcytic anemia in the past 3-4 month, ferritin 11, low iron and sat, supports iron deficient anemia. Possible related to her recent surgery. -I recommend ferrous sulfate 325 mg 2-3 times a day. Prescription sent to her pharmacy. Constipation reviewed with patient. -She has been taking oral iron pill for 3 months. Her hemoglobin has improved, but not normalized, ferritin also improved but still in the low range. I recommend IV Feraheme. We'll set up the information with her next visit.  Plan 1. Start Arimidex next week.  2. RTC in 6 weks for follow up  Truitt Merle 10/11/2014

## 2014-10-13 ENCOUNTER — Telehealth: Payer: Self-pay | Admitting: *Deleted

## 2014-10-13 NOTE — Telephone Encounter (Signed)
Patient called concerned that she received a message that she has appts on 4/8 and 4/15.  Her understanding is that she is not to return for 6 weeks and then possibly get an iron infusion.  Read Dr. Ernestina Penna note from 4/4 and onc tx schedule and I think that the patient is correct.  Discussed with Alfredo Martinez.  Will cancel appts for 4/8 and 4/15 and add infusion appt to 11/23/14.  Let patient know that 4/8 and 4/15 are cancelled.  She appreciated my help.

## 2014-10-15 ENCOUNTER — Ambulatory Visit: Payer: Medicare Other

## 2014-10-17 ENCOUNTER — Encounter: Payer: Self-pay | Admitting: Hematology

## 2014-10-18 ENCOUNTER — Telehealth: Payer: Self-pay | Admitting: *Deleted

## 2014-10-18 ENCOUNTER — Ambulatory Visit
Admission: RE | Admit: 2014-10-18 | Discharge: 2014-10-18 | Disposition: A | Discharge status: Home / Self Care | Payer: Medicare Other | Source: Ambulatory Visit | Attending: Neurosurgery | Admitting: Neurosurgery

## 2014-10-18 DIAGNOSIS — M4806 Spinal stenosis, lumbar region: Secondary | ICD-10-CM | POA: Diagnosis not present

## 2014-10-18 DIAGNOSIS — Z87828 Personal history of other (healed) physical injury and trauma: Secondary | ICD-10-CM | POA: Diagnosis not present

## 2014-10-18 DIAGNOSIS — M47817 Spondylosis without myelopathy or radiculopathy, lumbosacral region: Secondary | ICD-10-CM | POA: Diagnosis not present

## 2014-10-18 MED ORDER — GADOBENATE DIMEGLUMINE 529 MG/ML IV SOLN
18.0000 mL | Freq: Once | INTRAVENOUS | Status: AC | PRN
  Administered 2014-10-18: 18 mL via INTRAVENOUS

## 2014-10-18 NOTE — Telephone Encounter (Signed)
EXPLAINED TO PT.THAT ANASTROZOLE WAS THE GENERIC FOR ARIMIDEX. PT. IS ANXIOUS ABOUT STARTING THE ARIMIDEX DUE TO SIDE EFFECTS. INSTRUCTED PT. TO CALL DR.FENG'S OFFICE WITH ANY QUESTIONS OR CONCERNS AFTER SHE STARTS THE ARIMIDEX. SHE VOICES UNDERSTANDING.

## 2014-10-21 DIAGNOSIS — I10 Essential (primary) hypertension: Secondary | ICD-10-CM | POA: Diagnosis not present

## 2014-10-21 DIAGNOSIS — Z6832 Body mass index (BMI) 32.0-32.9, adult: Secondary | ICD-10-CM | POA: Diagnosis not present

## 2014-10-21 DIAGNOSIS — M4806 Spinal stenosis, lumbar region: Secondary | ICD-10-CM | POA: Diagnosis not present

## 2014-10-21 DIAGNOSIS — M5136 Other intervertebral disc degeneration, lumbar region: Secondary | ICD-10-CM | POA: Diagnosis not present

## 2014-10-21 DIAGNOSIS — M4726 Other spondylosis with radiculopathy, lumbar region: Secondary | ICD-10-CM | POA: Diagnosis not present

## 2014-10-21 DIAGNOSIS — M5416 Radiculopathy, lumbar region: Secondary | ICD-10-CM | POA: Diagnosis not present

## 2014-10-22 ENCOUNTER — Ambulatory Visit: Payer: PRIVATE HEALTH INSURANCE

## 2014-11-03 DIAGNOSIS — H3509 Other intraretinal microvascular abnormalities: Secondary | ICD-10-CM | POA: Diagnosis not present

## 2014-11-03 DIAGNOSIS — H402233 Chronic angle-closure glaucoma, bilateral, severe stage: Secondary | ICD-10-CM | POA: Diagnosis not present

## 2014-11-03 DIAGNOSIS — H2513 Age-related nuclear cataract, bilateral: Secondary | ICD-10-CM | POA: Diagnosis not present

## 2014-11-03 DIAGNOSIS — I7090 Unspecified atherosclerosis: Secondary | ICD-10-CM | POA: Diagnosis not present

## 2014-11-03 DIAGNOSIS — H35033 Hypertensive retinopathy, bilateral: Secondary | ICD-10-CM | POA: Diagnosis not present

## 2014-11-03 DIAGNOSIS — E119 Type 2 diabetes mellitus without complications: Secondary | ICD-10-CM | POA: Diagnosis not present

## 2014-11-03 DIAGNOSIS — H25013 Cortical age-related cataract, bilateral: Secondary | ICD-10-CM | POA: Diagnosis not present

## 2014-11-17 DIAGNOSIS — M62451 Contracture of muscle, right thigh: Secondary | ICD-10-CM | POA: Diagnosis not present

## 2014-11-17 DIAGNOSIS — M25652 Stiffness of left hip, not elsewhere classified: Secondary | ICD-10-CM | POA: Diagnosis not present

## 2014-11-17 DIAGNOSIS — M545 Low back pain: Secondary | ICD-10-CM | POA: Diagnosis not present

## 2014-11-17 DIAGNOSIS — M25651 Stiffness of right hip, not elsewhere classified: Secondary | ICD-10-CM | POA: Diagnosis not present

## 2014-11-21 ENCOUNTER — Emergency Department (HOSPITAL_BASED_OUTPATIENT_CLINIC_OR_DEPARTMENT_OTHER): Payer: Medicare Other

## 2014-11-21 ENCOUNTER — Emergency Department (HOSPITAL_BASED_OUTPATIENT_CLINIC_OR_DEPARTMENT_OTHER)
Admission: EM | Admit: 2014-11-21 | Discharge: 2014-11-22 | Disposition: A | Discharge status: Home / Self Care | Payer: Medicare Other | Attending: Emergency Medicine | Admitting: Emergency Medicine

## 2014-11-21 ENCOUNTER — Other Ambulatory Visit: Payer: Self-pay

## 2014-11-21 ENCOUNTER — Encounter (HOSPITAL_BASED_OUTPATIENT_CLINIC_OR_DEPARTMENT_OTHER): Payer: Self-pay | Admitting: Emergency Medicine

## 2014-11-21 DIAGNOSIS — R079 Chest pain, unspecified: Secondary | ICD-10-CM | POA: Diagnosis not present

## 2014-11-21 DIAGNOSIS — R071 Chest pain on breathing: Secondary | ICD-10-CM | POA: Diagnosis not present

## 2014-11-21 DIAGNOSIS — E119 Type 2 diabetes mellitus without complications: Secondary | ICD-10-CM | POA: Insufficient documentation

## 2014-11-21 DIAGNOSIS — M1990 Unspecified osteoarthritis, unspecified site: Secondary | ICD-10-CM | POA: Diagnosis not present

## 2014-11-21 DIAGNOSIS — K219 Gastro-esophageal reflux disease without esophagitis: Secondary | ICD-10-CM | POA: Insufficient documentation

## 2014-11-21 DIAGNOSIS — Z9889 Other specified postprocedural states: Secondary | ICD-10-CM | POA: Diagnosis not present

## 2014-11-21 DIAGNOSIS — I2510 Atherosclerotic heart disease of native coronary artery without angina pectoris: Secondary | ICD-10-CM | POA: Insufficient documentation

## 2014-11-21 DIAGNOSIS — Z79899 Other long term (current) drug therapy: Secondary | ICD-10-CM | POA: Insufficient documentation

## 2014-11-21 DIAGNOSIS — Z8619 Personal history of other infectious and parasitic diseases: Secondary | ICD-10-CM | POA: Diagnosis not present

## 2014-11-21 DIAGNOSIS — Z7982 Long term (current) use of aspirin: Secondary | ICD-10-CM | POA: Insufficient documentation

## 2014-11-21 DIAGNOSIS — E785 Hyperlipidemia, unspecified: Secondary | ICD-10-CM | POA: Diagnosis not present

## 2014-11-21 DIAGNOSIS — F329 Major depressive disorder, single episode, unspecified: Secondary | ICD-10-CM | POA: Insufficient documentation

## 2014-11-21 DIAGNOSIS — Z8673 Personal history of transient ischemic attack (TIA), and cerebral infarction without residual deficits: Secondary | ICD-10-CM | POA: Diagnosis not present

## 2014-11-21 DIAGNOSIS — Z87891 Personal history of nicotine dependence: Secondary | ICD-10-CM | POA: Diagnosis not present

## 2014-11-21 DIAGNOSIS — I517 Cardiomegaly: Secondary | ICD-10-CM | POA: Diagnosis not present

## 2014-11-21 DIAGNOSIS — E669 Obesity, unspecified: Secondary | ICD-10-CM | POA: Insufficient documentation

## 2014-11-21 DIAGNOSIS — Z794 Long term (current) use of insulin: Secondary | ICD-10-CM | POA: Diagnosis not present

## 2014-11-21 DIAGNOSIS — I10 Essential (primary) hypertension: Secondary | ICD-10-CM | POA: Diagnosis not present

## 2014-11-21 DIAGNOSIS — H409 Unspecified glaucoma: Secondary | ICD-10-CM | POA: Insufficient documentation

## 2014-11-21 DIAGNOSIS — Z862 Personal history of diseases of the blood and blood-forming organs and certain disorders involving the immune mechanism: Secondary | ICD-10-CM | POA: Insufficient documentation

## 2014-11-21 LAB — CBC
HCT: 38.3 % (ref 36.0–46.0)
HEMOGLOBIN: 12.3 g/dL (ref 12.0–15.0)
MCH: 26.2 pg (ref 26.0–34.0)
MCHC: 32.1 g/dL (ref 30.0–36.0)
MCV: 81.7 fL (ref 78.0–100.0)
PLATELETS: 185 10*3/uL (ref 150–400)
RBC: 4.69 MIL/uL (ref 3.87–5.11)
RDW: 19.4 % — ABNORMAL HIGH (ref 11.5–15.5)
WBC: 8.6 10*3/uL (ref 4.0–10.5)

## 2014-11-21 LAB — D-DIMER, QUANTITATIVE (NOT AT ARMC): D DIMER QUANT: 0.65 ug{FEU}/mL — AB (ref 0.00–0.48)

## 2014-11-21 LAB — TROPONIN I: Troponin I: <0.03 ng/mL (ref <0.031)

## 2014-11-21 LAB — BASIC METABOLIC PANEL
ANION GAP: 9 (ref 5–15)
BUN: 22 mg/dL — ABNORMAL HIGH (ref 6–20)
CALCIUM: 9.4 mg/dL (ref 8.9–10.3)
CHLORIDE: 107 mmol/L (ref 101–111)
CO2: 28 mmol/L (ref 22–32)
Creatinine, Ser: 1.08 mg/dL — ABNORMAL HIGH (ref 0.44–1.00)
GFR calc Af Amer: >60 mL/min (ref >60)
GFR calc non Af Amer: 52 mL/min — ABNORMAL LOW (ref >60)
Glucose, Bld: 122 mg/dL — ABNORMAL HIGH (ref 65–99)
POTASSIUM: 3.6 mmol/L (ref 3.5–5.1)
SODIUM: 144 mmol/L (ref 135–145)

## 2014-11-21 MED ORDER — CLONIDINE HCL 0.1 MG PO TABS
0.1000 mg | ORAL_TABLET | Freq: Once | ORAL | Status: AC
  Administered 2014-11-21: 0.1 mg via ORAL
  Filled 2014-11-21: qty 1

## 2014-11-21 MED ORDER — IOHEXOL 350 MG/ML SOLN
100.0000 mL | Freq: Once | INTRAVENOUS | Status: AC | PRN
  Administered 2014-11-21: 100 mL via INTRAVENOUS

## 2014-11-21 MED ORDER — ASPIRIN 81 MG PO CHEW
324.0000 mg | CHEWABLE_TABLET | Freq: Once | ORAL | Status: AC
  Administered 2014-11-21: 324 mg via ORAL
  Filled 2014-11-21: qty 4

## 2014-11-21 MED ORDER — SODIUM CHLORIDE 0.9 % IV BOLUS (SEPSIS)
1000.0000 mL | Freq: Once | INTRAVENOUS | Status: AC
  Administered 2014-11-21: 1000 mL via INTRAVENOUS

## 2014-11-21 MED ORDER — HYDRALAZINE HCL 20 MG/ML IJ SOLN
10.0000 mg | Freq: Once | INTRAMUSCULAR | Status: AC
  Administered 2014-11-21: 10 mg via INTRAVENOUS
  Filled 2014-11-21: qty 1

## 2014-11-21 MED ORDER — CARVEDILOL 25 MG PO TABS: 25.0000 mg | ORAL_TABLET | Freq: Two times a day (BID) | ORAL | Status: DC

## 2014-11-21 NOTE — Discharge Instructions (Signed)
Return to the ED with any concerns including difficulty breathing, worsening chest pain, changes in vision or speech, weakness of arms or legs, fainting, decreased level of alertness/lethargy, or any other alarming symptoms  The cardiologist on call recommends stopping your metoprolol and taking carvedilol 25mg  by mouth twice daily instead to help control your blood pressure.  It is very important that you see your doctor to have your blood pressure rechecked in the next 2-3 days

## 2014-11-21 NOTE — ED Provider Notes (Signed)
CSN: [id number]     Arrival date & time 11/21/14  1953 History  This chart was scribed for Karen Beers, MD by Chester Holstein, ED Scribe. This patient was seen in room MH09/MH09 and the patient's care was started at 8:36 PM.    Chief Complaint  Patient presents with  . Chest Pain    The history is provided by the patient. No language interpreter was used.   HPI Comments: [name] is a 69 y.o. female with PMHx of TIA, DM, HTN, HLD, CAD, PVD, and GERD who presents to the Emergency Department complaining of intermittent achy chest pain under left breast, lasting several hours at a time, with onset last night. Pt states she was sitting at onset. Pt states sometimes deep inspiration worsens the pain. She states pressure aggravates the pain. Pt with h/o stent placement. She took NTG last night and this afternoon with mild relief. Pt denies abdominal pain, SOB, nausea, diaphoresis, and bilateral leg swelling.   Past Medical History  Diagnosis Date  . HTN (hypertension)   . Hyperlipidemia   . TIA (transient ischemic attack)     also Hx of it.   Marland Kitchen GERD (gastroesophageal reflux disease)   . Headache(784.0)   . Anemia, unspecified   . Edema   . Depressive disorder, not elsewhere classified   . Low back pain   . Obesity   . Duodenal nodule   . Hemorrhoids   . Arthritis   . Glaucoma     narrow angle, sees Dr. Herbert Deaner   . Type II or unspecified type diabetes mellitus without mention of complication, not stated as uncontrolled     sees Dr. Carrolyn Meiers   . Gastric polyps     COLON  . Allergy   . CAD (coronary artery disease)     sees. Dr. Burt Knack. s/p cath with PCI of RCA (xience des) June 2010. Pt also with LAD and D2 dzs-NL FFR in both areas med Rx  . Peripheral vascular disease   . Stroke     TIA  "many yrs ago"    . HSV (herpes simplex virus) anogenital infection 05/2014   Past Surgical History  Procedure Laterality Date  . Bilateral carpal tunnel release    . Colonoscopy   05-29-11    per Dr. Henrene Pastor, benign polyps, repeat in 5 yrs    . Esophagogastroduodenoscopy  02-08-06    per Dr. Henrene Pastor, gastritis   . Glaucoma surgery      with laser, per Dr. Henrene Pastor   . Carotid endarterectomy  march 2012    per Dr. Morton Amy  . Abdominal hysterectomy  age 57    TAH and USO  Leiomyomata  . Back surgery  2008    lumb fusion  . Cholecystectomy    . Dilation and curettage of uterus    . Breast biopsy Left 01/26/2014    Procedure: EXCISION LEFT BREAST MASS;  Surgeon: Adin Hector, MD;  Location: [facility];  Service: General;  Laterality: Left;  Marland Kitchen Eye surgery    . Cardiac catheterization    . Carpal tunnel release Left 05/14/2014    Procedure: Left Carpal tunnel release;  Surgeon: Ashok Pall, MD;  Location: [facility];  Service: Neurosurgery;  Laterality: Left;  Left Carpal tunnel release  . Ulnar nerve transposition Left 05/14/2014    Procedure: Left Ulnar nerve decompression;  Surgeon: Ashok Pall, MD;  Location: [facility];  Service: Neurosurgery;  Laterality: Left;  Left  Ulnar nerve decompression  . Anterior cervical corpectomy N/A 06/11/2014    Procedure: ANTERIOR CERVICAL CORPECTOMY CERVICAL SIX; WITH CERVICAL FIVE TO CERVICAL SEVEN ARTHRODESIS;  Surgeon: Ashok Pall, MD;  Location: [facility];  Service: Neurosurgery;  Laterality: N/A;   Family History  Problem Relation Age of Onset  . Coronary artery disease Neg Hx     premature CAD  . Colon cancer Neg Hx   . Esophageal cancer Neg Hx   . Stomach cancer Neg Hx   . Hypertension Mother   . Hypertension Sister   . Hyperlipidemia Sister   . Hypertension Brother   . Cancer Brother     PROSTATE/LUNG  . Diabetes Brother   . Heart disease Brother     Before age 8 - Bypass  . Varicose Veins Brother   . Cancer Father     Prostate and pancreatic    History  Substance Use Topics  . Smoking status: Former Smoker    Quit date: 01/22/1967  . Smokeless tobacco: Never Used     Comment:  non smoker  . Alcohol Use: No   OB History    Gravida Para Term Preterm AB TAB SAB Ectopic Multiple Living   2 1 1  1  1   1      Review of Systems  Constitutional: Negative for diaphoresis.  Respiratory: Negative for shortness of breath.   Cardiovascular: Positive for chest pain. Negative for leg swelling.  Gastrointestinal: Negative for nausea and abdominal pain.  All other systems reviewed and are negative.     Allergies  Review of patient's allergies indicates no known allergies.  Home Medications   Prior to Admission medications   Medication Sig Start Date End Date Taking? Authorizing Provider  anastrozole (ARIMIDEX) 1 MG tablet Take 1 tablet (1 mg total) by mouth daily. 07/20/14   Truitt Merle, MD  aspirin 81 MG tablet Take 81 mg by mouth daily.     Historical Provider, MD  carvedilol (COREG) 25 MG tablet Take 1 tablet (25 mg total) by mouth 2 (two) times daily with a meal. Patient not taking: Reported on 11/23/2014 11/21/14   Karen Beers, MD  diclofenac sodium (VOLTAREN) 1 % GEL Apply 2 g topically 4 (four) times daily as needed (for pain).     Historical Provider, MD  ergocalciferol (VITAMIN D2) 50000 UNITS capsule Take 50,000 Units by mouth once a week. On Saturday.    Historical Provider, MD  ferrous sulfate 325 (65 FE) MG EC tablet Take 1 tablet (325 mg total) by mouth 3 (three) times daily with meals. Patient not taking: Reported on 11/23/2014 07/20/14   Truitt Merle, MD  furosemide (LASIX) 40 MG tablet Take 40 mg by mouth daily as needed for fluid.     Historical Provider, MD  glucose blood test strip 1 each by Other route See admin instructions. Check blood sugar once daily.    Historical Provider, MD  hydrALAZINE (APRESOLINE) 100 MG tablet Take 100 mg by mouth 3 (three) times daily.    Historical Provider, MD  HYDROcodone-acetaminophen (NORCO) 5-325 MG per tablet Take 1-2 tablets by mouth every 6 (six) hours as needed for moderate pain or severe pain. 01/26/14   Fanny Skates,  MD  insulin aspart (NOVOLOG) 100 UNIT/ML injection Inject 10 Units into the skin 3 (three) times daily with meals.     Historical Provider, MD  insulin glargine (LANTUS) 100 UNIT/ML injection Inject 30 Units into the skin daily.     Historical Provider,  MD  irbesartan (AVAPRO) 300 MG tablet Take 300 mg by mouth daily.    Historical Provider, MD  isosorbide mononitrate (IMDUR) 60 MG 24 hr tablet Take 60 mg by mouth daily.    Historical Provider, MD  latanoprost (XALATAN) 0.005 % ophthalmic solution Place 1 drop into both eyes daily.  09/26/11   Historical Provider, MD  meloxicam (MOBIC) 15 MG tablet Take 15 mg by mouth daily.  09/03/11   Historical Provider, MD  metoprolol succinate (TOPROL-XL) 100 MG 24 hr tablet Take 100 mg by mouth daily. Take with or immediately following a meal.    Historical Provider, MD  nitroGLYCERIN (NITROSTAT) 0.4 MG SL tablet Place 0.4 mg under the tongue every 5 (five) minutes as needed for chest pain.    Historical Provider, MD  pantoprazole (PROTONIX) 40 MG tablet Take 1 tablet (40 mg total) by mouth daily. 01/28/14   Laurey Morale, MD  rosuvastatin (CRESTOR) 40 MG tablet Take 40 mg by mouth daily.    Historical Provider, MD   BP 174/61 mmHg  Pulse 74  Temp(Src) 98.6 F (37 C) (Oral)  Resp 15  Ht 5\' 4"  (1.626 m)  Wt 190 lb (86.183 kg)  BMI 32.60 kg/m2  SpO2 95%  Vitals reviewed Physical Exam  Physical Examination: General appearance - alert, well appearing, and in no distress Mental status - alert, oriented to person, place, and time Eyes - no conjunctival injection no scleral icterus Mouth - mucous membranes moist, pharynx normal without lesions Chest - clear to auscultation, no wheezes, rales or rhonchi, symmetric air entry Heart - normal rate, regular rhythm, normal S1, S2, no murmurs, rubs, clicks or gallops Abdomen - soft, nontender, nondistended, no masses or organomegaly Neurological - alert, orientedx 3, strength intact Extremities - peripheral pulses  normal, no pedal edema, no clubbing or cyanosis Skin - normal coloration and turgor, no rashes  ED Course  Procedures (including critical care time DIAGNOSTIC STUDIES: Oxygen Saturation is 100% on room air, normal by my interpretation.    COORDINATION OF CARE: 8:42 PM Discussed treatment plan with patient at beside, the patient agrees with the plan and has no further questions at this time.  11:33 PM d/w cardiology fellow, he states pt is all right for outpatient followup.  For blood pressure he recommends dose of clonidine now, hydralazine IV and switch from metoprolol to carvedilol 25mg  po BID.  Pt would like to avoid being admitted so she is agreeable with this plan.     Labs Review Labs Reviewed  CBC - Abnormal; Notable for the following:    RDW 19.4 (*)    All other components within normal limits  BASIC METABOLIC PANEL - Abnormal; Notable for the following:    Glucose, Bld 122 (*)    BUN 22 (*)    Creatinine, Ser 1.08 (*)    GFR calc non Af Amer 52 (*)    All other components within normal limits  D-DIMER, QUANTITATIVE - Abnormal; Notable for the following:    D-Dimer, Quant 0.65 (*)    All other components within normal limits  TROPONIN I  TROPONIN I    Imaging Review No results found.   EKG Interpretation   Date/Time:  Sunday Nov 21 2014 20:00:16 EDT Ventricular Rate:  63 PR Interval:  160 QRS Duration: 96 QT Interval:  426 QTC Calculation: 435 R Axis:   52 Text Interpretation:  Normal sinus rhythm Normal ECG ED PHYSICIAN  INTERPRETATION AVAILABLE IN [facility] Confirmed by TEST,  Record  ([id number]) on 11/23/2014 7:56:45 AM      MDM   Final diagnoses:  Chest pain  Essential hypertension   Pt presenting with c/o chest pain under her left breast.  She has reassuring EKG, troponin reassuring.  D-dimer was elevated, but CT angio showed no evidence of PE.  D/w cardiology as patient has hx of CAD- prior MI and one stent.  cardiogy states no further cardiac  workup is necessary, recommends BP control as noted above and close followup this week for blood pressure check.  Pt is agreeable with this plan.  I had initially offered admission and patient strongly prefers to be discharged home.  Discharged with strict return precautions.  Pt agreeable with plan.  I personally performed the services described in this documentation, which was scribed in my presence. The recorded information has been reviewed and is accurate.     Karen Beers, MD 11/23/14 2252

## 2014-11-21 NOTE — ED Notes (Signed)
Dr. Linker at BS 

## 2014-11-21 NOTE — ED Notes (Addendum)
Pt alert, NAD, calm, interactive, resps e/u, speaking in clear complete sentences, VSS, BP elevated, reports intermitent pain, (denies: other sx), no dyspnea noted. No edema.

## 2014-11-21 NOTE — ED Notes (Signed)
Patient states that she is having pain to her just under her left breast since last night.

## 2014-11-21 NOTE — ED Notes (Signed)
Back from xray, VSS, BP improved, given ASA, lab results noted, no changes, alert, NAD, calm, interactive, no dyspnea noted.

## 2014-11-22 DIAGNOSIS — R921 Mammographic calcification found on diagnostic imaging of breast: Secondary | ICD-10-CM | POA: Diagnosis not present

## 2014-11-23 ENCOUNTER — Ambulatory Visit: Payer: Medicare Other

## 2014-11-23 ENCOUNTER — Other Ambulatory Visit (HOSPITAL_BASED_OUTPATIENT_CLINIC_OR_DEPARTMENT_OTHER): Payer: Medicare Other

## 2014-11-23 ENCOUNTER — Encounter: Payer: Self-pay | Admitting: Internal Medicine

## 2014-11-23 ENCOUNTER — Ambulatory Visit (HOSPITAL_BASED_OUTPATIENT_CLINIC_OR_DEPARTMENT_OTHER): Payer: Medicare Other | Admitting: Hematology

## 2014-11-23 ENCOUNTER — Encounter: Payer: Self-pay | Admitting: Hematology

## 2014-11-23 ENCOUNTER — Telehealth: Payer: Self-pay | Admitting: Hematology

## 2014-11-23 VITALS — BP 156/50 | HR 18 | Temp 98.8°F | Resp 18 | Ht 64.0 in | Wt 198.9 lb

## 2014-11-23 DIAGNOSIS — D509 Iron deficiency anemia, unspecified: Secondary | ICD-10-CM

## 2014-11-23 DIAGNOSIS — D500 Iron deficiency anemia secondary to blood loss (chronic): Secondary | ICD-10-CM

## 2014-11-23 DIAGNOSIS — N62 Hypertrophy of breast: Secondary | ICD-10-CM | POA: Diagnosis not present

## 2014-11-23 DIAGNOSIS — N6092 Unspecified benign mammary dysplasia of left breast: Secondary | ICD-10-CM

## 2014-11-23 DIAGNOSIS — D649 Anemia, unspecified: Secondary | ICD-10-CM

## 2014-11-23 LAB — CBC & DIFF AND RETIC
BASO%: 0.4 % (ref 0.0–2.0)
Basophils Absolute: 0 10*3/uL (ref 0.0–0.1)
EOS%: 1.2 % (ref 0.0–7.0)
Eosinophils Absolute: 0.1 10*3/uL (ref 0.0–0.5)
HCT: 38.6 % (ref 34.8–46.6)
HGB: 12.2 g/dL (ref 11.6–15.9)
Immature Retic Fract: 13.6 % — ABNORMAL HIGH (ref 1.60–10.00)
LYMPH%: 31.7 % (ref 14.0–49.7)
MCH: 25.9 pg (ref 25.1–34.0)
MCHC: 31.6 g/dL (ref 31.5–36.0)
MCV: 82 fL (ref 79.5–101.0)
MONO#: 0.5 10*3/uL (ref 0.1–0.9)
MONO%: 6.2 % (ref 0.0–14.0)
NEUT%: 60.5 % (ref 38.4–76.8)
NEUTROS ABS: 5.1 10*3/uL (ref 1.5–6.5)
PLATELETS: 184 10*3/uL (ref 145–400)
RBC: 4.71 10*6/uL (ref 3.70–5.45)
RDW: 18.7 % — ABNORMAL HIGH (ref 11.2–14.5)
RETIC %: 1.16 % (ref 0.70–2.10)
RETIC CT ABS: 54.64 10*3/uL (ref 33.70–90.70)
WBC: 8.4 10*3/uL (ref 3.9–10.3)
lymph#: 2.7 10*3/uL (ref 0.9–3.3)

## 2014-11-23 LAB — COMPREHENSIVE METABOLIC PANEL (CC13)
ALT: 17 U/L (ref 0–55)
AST: 18 U/L (ref 5–34)
Albumin: 3.7 g/dL (ref 3.5–5.0)
Alkaline Phosphatase: 77 U/L (ref 40–150)
Anion Gap: 11 mEq/L (ref 3–11)
BILIRUBIN TOTAL: 0.31 mg/dL (ref 0.20–1.20)
BUN: 15.6 mg/dL (ref 7.0–26.0)
CALCIUM: 9.2 mg/dL (ref 8.4–10.4)
CHLORIDE: 110 meq/L — AB (ref 98–109)
CO2: 26 mEq/L (ref 22–29)
CREATININE: 0.9 mg/dL (ref 0.6–1.1)
EGFR: 75 mL/min/{1.73_m2} — ABNORMAL LOW (ref >90)
Glucose: 150 mg/dl — ABNORMAL HIGH (ref 70–140)
Potassium: 3.9 mEq/L (ref 3.5–5.1)
SODIUM: 147 meq/L — AB (ref 136–145)
Total Protein: 6.8 g/dL (ref 6.4–8.3)

## 2014-11-23 NOTE — Progress Notes (Signed)
[facility] FOLLOW UP NOTE    Patient Care Team: Laurey Morale, MD as PCP - [place], MD as Consulting Physician (Endocrinology) Sherren Mocha, MD as Consulting Physician (Cardiology) Ashok Pall, MD as Consulting Physician (Neurosurgery) Fanny Skates, MD (Surgeon) [place] (Gynecologist)  CHIEF COMPLAINTS:  Follow up Atypical Lobular Hyperplasia [facility])  HISTORY OF INITIAL PRESENTING ILLNESS:   [name] 69 y.o. female from [place], [name] is here because of recent diagnosis of Left breast Jackson which is a precursor lesion and imparts a higher future risk for developing invasive and non-invasive breast cancers in future. She also have several comorbid conditions like Diabetes, HTN, Chronic back pain, Coronary artery disease (1 stent), TIA and fibromyalgia.  She had history of a right breast biopsy on July 27,2010 consistent with benign stromal calcifications and no malignancy. She noticed a lump in left breast this year in May 2015 as she keeps her extra money in bra and she noticed a change in her breast when she was removing money. She underwent a 3D diagnostic mammogram and an ultrasound on 11/18/2013 read as benign and there was no sonographic evidence of malignancy. Patient saw Dr Dalbert Batman in surgery after being referred by Dr Phineas Real for this palpable lump with negative imaging studies.  She then had a lumpectomy done on 01/26/2014 and the pathology from that showed [place] (Atypical Lobular Hyperplasia). There was no invasive cancer or cancer in situ and margins were negative.Pathology accession no is [id number]. She is now referred to high risk breast clinic to discuss chemoprevention strategies.she is up to date with her pap smear, colonoscopy and bone density.  In terms of breast cancer risk profile:  She menarched at early age of 89 and went to menopause at age 71 (TAH due to fibroids). She had 2 pregnancies resulting in miscarriagesy, no  biological children, 1 adopted boy.  She did not breast-fed.  She received birth control pills for couple of years and also had IUD placed.  She was never exposed to fertility medications but took HRT for few years.  She has + family history of Breast cancer. 2 maternal cousins developed breast cancer (age 11's and 18's) and her maternal aunt had breast cancer also. 1 brother with prostate cancer. 1 sister healthy as a horse. Father had prostate cancer and stomach cancer. Adopted boy is 22 years old. Mother alive at age 81 with early dementia.No ovarian cancer in family.  INTERIM HISTORY: She returns for follow up. She started Arimidex 3 weeks ago, she has noticed some mild fatigue, and muscle stiffness, and one episode of hot flush this morning. She is on PT (water therapy) twice a week for her back injury, and her pain is still moderate. She takes tramadol for that. She denies any other new symptoms.    MEDICAL HISTORY:  Past Medical History  Diagnosis Date  . HTN (hypertension)   . Hyperlipidemia   . TIA (transient ischemic attack)     also Hx of it.   Marland Kitchen GERD (gastroesophageal reflux disease)   . Headache(784.0)   . Anemia, unspecified   . Edema   . Depressive disorder, not elsewhere classified   . Low back pain   . Obesity   . Duodenal nodule   . Hemorrhoids   . Arthritis   . Glaucoma     narrow angle, sees Dr. Herbert Deaner   . Type II or unspecified type diabetes mellitus without mention of complication, not stated as uncontrolled  sees Dr. Carrolyn Meiers   . Gastric polyps     COLON  . Allergy   . CAD (coronary artery disease)     sees. Dr. Burt Knack. s/p cath with PCI of RCA (xience des) June 2010. Pt also with LAD and D2 dzs-NL FFR in both areas med Rx  . Peripheral vascular disease   . Stroke     TIA  "many yrs ago"    . HSV (herpes simplex virus) anogenital infection 05/2014    SURGICAL HISTORY: Past Surgical History  Procedure Laterality Date  . Bilateral carpal tunnel  release    . Colonoscopy  05-29-11    per Dr. Henrene Pastor, benign polyps, repeat in 5 yrs    . Esophagogastroduodenoscopy  02-08-06    per Dr. Henrene Pastor, gastritis   . Glaucoma surgery      with laser, per Dr. Henrene Pastor   . Carotid endarterectomy  march 2012    per Dr. Morton Amy  . Abdominal hysterectomy  age 87    TAH and USO  Leiomyomata  . Back surgery  2008    lumb fusion  . Cholecystectomy    . Dilation and curettage of uterus    . Breast biopsy Left 01/26/2014    Procedure: EXCISION LEFT BREAST MASS;  Surgeon: Adin Hector, MD;  Location: [facility];  Service: General;  Laterality: Left;  Marland Kitchen Eye surgery    . Cardiac catheterization    . Carpal tunnel release Left 05/14/2014    Procedure: Left Carpal tunnel release;  Surgeon: Ashok Pall, MD;  Location: [facility];  Service: Neurosurgery;  Laterality: Left;  Left Carpal tunnel release  . Ulnar nerve transposition Left 05/14/2014    Procedure: Left Ulnar nerve decompression;  Surgeon: Ashok Pall, MD;  Location: [facility];  Service: Neurosurgery;  Laterality: Left;  Left Ulnar nerve decompression  . Anterior cervical corpectomy N/A 06/11/2014    Procedure: ANTERIOR CERVICAL CORPECTOMY CERVICAL SIX; WITH CERVICAL FIVE TO CERVICAL SEVEN ARTHRODESIS;  Surgeon: Ashok Pall, MD;  Location: [facility];  Service: Neurosurgery;  Laterality: N/A;    SOCIAL HISTORY: History   Social History  . Marital Status: Married    Spouse Name: N/A  . Number of Children: N/A  . Years of Education: N/A   Occupational History  . Not on file.   Social History Main Topics  . Smoking status: Former Smoker    Quit date: 01/22/1967  . Smokeless tobacco: Never Used     Comment: non smoker  . Alcohol Use: No  . Drug Use: No  . Sexual Activity: No     Comment: HYST   Other Topics Concern  . Not on file   Social History Narrative    FAMILY HISTORY: Family History  Problem Relation Age of Onset  . Coronary artery disease  Neg Hx     premature CAD  . Colon cancer Neg Hx   . Esophageal cancer Neg Hx   . Stomach cancer Neg Hx   . Hypertension Mother   . Hypertension Sister   . Hyperlipidemia Sister   . Hypertension Brother   . Cancer Brother     PROSTATE/LUNG  . Diabetes Brother   . Heart disease Brother     Before age 62 - Bypass  . Varicose Veins Brother   . Cancer Father     Prostate and pancreatic     ALLERGIES:  has No Known Allergies.  MEDICATIONS:  Current Outpatient Prescriptions  Medication  Sig Dispense Refill  . anastrozole (ARIMIDEX) 1 MG tablet Take 1 tablet (1 mg total) by mouth daily. 30 tablet 5  . aspirin 81 MG tablet Take 81 mg by mouth daily.     . diclofenac sodium (VOLTAREN) 1 % GEL Apply 2 g topically 4 (four) times daily as needed (for pain).     Marland Kitchen ergocalciferol (VITAMIN D2) 50000 UNITS capsule Take 50,000 Units by mouth once a week. On Saturday.    . furosemide (LASIX) 40 MG tablet Take 40 mg by mouth daily as needed for fluid.     Marland Kitchen glucose blood test strip 1 each by Other route See admin instructions. Check blood sugar once daily.    . hydrALAZINE (APRESOLINE) 100 MG tablet Take 100 mg by mouth 3 (three) times daily.    Marland Kitchen HYDROcodone-acetaminophen (NORCO) 5-325 MG per tablet Take 1-2 tablets by mouth every 6 (six) hours as needed for moderate pain or severe pain. 30 tablet 0  . insulin aspart (NOVOLOG) 100 UNIT/ML injection Inject 10 Units into the skin 3 (three) times daily with meals.     . insulin glargine (LANTUS) 100 UNIT/ML injection Inject 30 Units into the skin daily.     . irbesartan (AVAPRO) 300 MG tablet Take 300 mg by mouth daily.    . isosorbide mononitrate (IMDUR) 60 MG 24 hr tablet Take 60 mg by mouth daily.    Marland Kitchen latanoprost (XALATAN) 0.005 % ophthalmic solution Place 1 drop into both eyes daily.     . meloxicam (MOBIC) 15 MG tablet Take 15 mg by mouth daily.     . nitroGLYCERIN (NITROSTAT) 0.4 MG SL tablet Place 0.4 mg under the tongue every 5 (five) minutes  as needed for chest pain.    . pantoprazole (PROTONIX) 40 MG tablet Take 1 tablet (40 mg total) by mouth daily. 90 tablet 3  . rosuvastatin (CRESTOR) 40 MG tablet Take 40 mg by mouth daily.    . carvedilol (COREG) 25 MG tablet Take 1 tablet (25 mg total) by mouth 2 (two) times daily with a meal. (Patient not taking: Reported on 11/23/2014) 60 tablet 0  . ferrous sulfate 325 (65 FE) MG EC tablet Take 1 tablet (325 mg total) by mouth 3 (three) times daily with meals. (Patient not taking: Reported on 11/23/2014) 90 tablet 3  . metoprolol succinate (TOPROL-XL) 100 MG 24 hr tablet Take 100 mg by mouth daily. Take with or immediately following a meal.     No current facility-administered medications for this visit.    ROS   PHYSICAL EXAMINATION: ECOG PERFORMANCE STATUS: 0  Filed Vitals:   11/23/14 1009  BP: 156/50  Pulse: 18  Temp: 98.8 F (37.1 C)  Resp: 18   Filed Weights   11/23/14 1009  Weight: 198 lb 14.4 oz (90.22 kg)    Physical Exam  Constitutional: She appears well-developed and well-nourished.  HENT:  Head: Normocephalic and atraumatic.  Nose: Nose normal.  Mouth/Throat: Oropharynx is clear and moist.  Eyes: Conjunctivae and EOM are normal. No scleral icterus.  Neck: Normal range of motion. Neck supple. No JVD present. No tracheal deviation present. No thyromegaly present.  Cardiovascular: Normal rate, regular rhythm and normal heart sounds.  Exam reveals no gallop and no friction rub.   No murmur heard. Pulmonary/Chest: Effort normal and breath sounds normal. No respiratory distress. She has no wheezes. She has no rales. Right breast exhibits no inverted nipple, no mass, no nipple discharge, no skin change and no tenderness.  Left breast exhibits no inverted nipple, no mass, no nipple discharge, no skin change and no tenderness. Breasts are symmetrical. There is no breast swelling.  Abdominal: Soft. Bowel sounds are normal. She exhibits no distension. There is no tenderness.  There is no rebound. No hernia.  Genitourinary: No breast bleeding.  Musculoskeletal: Normal range of motion. She exhibits no edema or tenderness.  Lymphadenopathy:    She has no cervical adenopathy.  Neurological: She is alert. She has normal reflexes. She displays normal reflexes. No cranial nerve deficit. She exhibits normal muscle tone. Coordination normal.  Skin: Skin is warm and dry. No rash noted. No erythema. No pallor.  Psychiatric: She has a normal mood and affect. Her behavior is normal. Judgment and thought content normal.    LABORATORY DATA:  CBC Latest Ref Rng 11/23/2014 11/21/2014 10/11/2014  WBC 3.9 - 10.3 10e3/uL 8.4 8.6 8.0  Hemoglobin 11.6 - 15.9 g/dL 12.2 12.3 12.4  Hematocrit 34.8 - 46.6 % 38.6 38.3 39.8  Platelets 145 - 400 10e3/uL 184 185 203    CMP Latest Ref Rng 11/23/2014 11/21/2014 10/11/2014  Glucose 70 - 140 mg/dl 150(H) 122(H) 170(H)  BUN 7.0 - 26.0 mg/dL 15.6 22(H) 16.5  Creatinine 0.6 - 1.1 mg/dL 0.9 1.08(H) 1.0  Sodium 136 - 145 mEq/L 147(H) 144 144  Potassium 3.5 - 5.1 mEq/L 3.9 3.6 3.6  Chloride 101 - 111 mmol/L - 107 -  CO2 22 - 29 mEq/L 26 28 27   Calcium 8.4 - 10.4 mg/dL 9.2 9.4 9.4  Total Protein 6.4 - 8.3 g/dL 6.8 - 7.1  Total Bilirubin 0.20 - 1.20 mg/dL 0.31 - 0.44  Alkaline Phos 40 - 150 U/L 77 - 86  AST 5 - 34 U/L 18 - 18  ALT 0 - 55 U/L 17 - 18   Iron and TIBC [facility] (Order [id number])      Iron and TIBC [facility]  Status: Finalresult Visible to patient:  MyChart Nextappt: Today at 11:00 AM in Oncology ([facility]) Dx:  Anemia, unspecified anemia type           Ref Range 32mo ago  69mo ago  6mo ago     Iron 41 - 142 ug/dL 94 177 (H) 25 (L)    TIBC 236 - 444 ug/dL 350 328 380    UIBC 120 - 384 ug/dL 256 150 355    %SAT 21 - 57 % 27 54 7 (L)        Ferritin (Order [id number])      Ferritin  Status: Finalresult Visible to patient:  MyChart Nextappt: Today at 11:00 AM in Oncology ([facility]) Dx:   Anemia, unspecified anemia type           Ref Range 39mo ago  37mo ago  31mo ago     Ferritin 9 - 269 ng/ml 27 22 11           RADIOGRAPHIC STUDIES: I have personally reviewed the radiological images as listed and agreed with the findings in the report.   ASSESSMENT:   69 years old female with diagnosis of ALH (Atypical Lobular Hyperplasia) seen on a Lumpectomy of left breast. This was done for a palpable lump which was negative on mammogram and ultrasound. She had previously a right breast biopsy which was also negative for malignancy.   1. Left breast ALH  -Dr. Lona Kettle has previously extensively discussed the risks of developing breast cancer and chemoprevention with tamoxifen versus anastrozole. The overall benefit of chemoprevention is reduce her  breast cancer by 50% with absolute benefit 3.5-4% risk reduction. She agreed to take chemoprevention  -Potential side effects of tamoxifen and Arimidex were reviewed with patient again today. She opted to take Arimidex. -she is tolerating anastrozole well, we'll continue, planning for total 5 years.  2. Iron deficient anemia -she has developed moderate microcytic anemia in the past 3-4 month, ferritin 11, low iron and sat, supports iron deficient anemia. Possible related to her recent surgery. -I recommend ferrous sulfate 325 mg 2-3 times a day. Prescription sent to her pharmacy. Constipation reviewed with patient. -She has been taking oral iron pill for 3 months. Her hemoglobin has normalized, serum iron and ferritin level also normalized, I encouraged her to continue oral iron pill.  Plan -continue anastrozole -Return to clinic in 3 months with lab   Truitt Merle 11/23/2014

## 2014-11-23 NOTE — Telephone Encounter (Signed)
per pof to sch pt appt-gave pt copy of sch °

## 2014-11-24 ENCOUNTER — Ambulatory Visit (INDEPENDENT_AMBULATORY_CARE_PROVIDER_SITE_OTHER): Payer: Medicare Other | Admitting: Cardiology

## 2014-11-24 ENCOUNTER — Ambulatory Visit (INDEPENDENT_AMBULATORY_CARE_PROVIDER_SITE_OTHER): Payer: Medicare Other | Admitting: Family Medicine

## 2014-11-24 ENCOUNTER — Encounter: Payer: Self-pay | Admitting: Family Medicine

## 2014-11-24 ENCOUNTER — Encounter: Payer: Self-pay | Admitting: Cardiology

## 2014-11-24 VITALS — BP 184/90 | HR 70 | Ht 64.0 in | Wt 197.4 lb

## 2014-11-24 VITALS — BP 203/83 | HR 77 | Temp 99.1°F | Ht 64.0 in | Wt 197.0 lb

## 2014-11-24 DIAGNOSIS — I2510 Atherosclerotic heart disease of native coronary artery without angina pectoris: Secondary | ICD-10-CM

## 2014-11-24 DIAGNOSIS — E1159 Type 2 diabetes mellitus with other circulatory complications: Secondary | ICD-10-CM | POA: Diagnosis not present

## 2014-11-24 DIAGNOSIS — Z9861 Coronary angioplasty status: Secondary | ICD-10-CM | POA: Diagnosis not present

## 2014-11-24 DIAGNOSIS — M797 Fibromyalgia: Secondary | ICD-10-CM

## 2014-11-24 DIAGNOSIS — J069 Acute upper respiratory infection, unspecified: Secondary | ICD-10-CM

## 2014-11-24 DIAGNOSIS — R079 Chest pain, unspecified: Secondary | ICD-10-CM | POA: Diagnosis not present

## 2014-11-24 DIAGNOSIS — E785 Hyperlipidemia, unspecified: Secondary | ICD-10-CM

## 2014-11-24 DIAGNOSIS — I10 Essential (primary) hypertension: Secondary | ICD-10-CM | POA: Diagnosis not present

## 2014-11-24 MED ORDER — DILTIAZEM HCL ER COATED BEADS 240 MG PO CP24: 240.0000 mg | ORAL_CAPSULE | Freq: Every day | ORAL | Status: DC

## 2014-11-24 MED ORDER — NITROGLYCERIN 0.4 MG SL SUBL: 0.4000 mg | SUBLINGUAL_TABLET | SUBLINGUAL | Status: AC | PRN

## 2014-11-24 NOTE — Assessment & Plan Note (Signed)
Residual LAD/Dx disease

## 2014-11-24 NOTE — Assessment & Plan Note (Signed)
Not well controlled, Toprol changed to Coreg

## 2014-11-24 NOTE — Progress Notes (Signed)
   Subjective:    Patient ID: [name], female    DOB: 09-15-1945, 69 y.o.   MRN: [id number]  HPI Here to follow up an ER visit on 11-21-14 for lightheadedness and weakness. Her BP that day was quite elevated, so she was given some IV hydralazine and some oral Clonidine. She was also switched from Metoprolol to Carvedilol 25 mg bid. She has not had this filled at her pharmacy yet however. Her BP at the Oncology office this am was 184/90. She has been battling a chest cold for 2 days as well, with chest congestion and a dry cough.   Review of Systems  Constitutional: Negative.   HENT: Positive for congestion. Negative for postnasal drip and sinus pressure.   Eyes: Negative.   Respiratory: Positive for cough. Negative for shortness of breath and wheezing.   Cardiovascular: Negative.   Neurological: Negative.        Objective:   Physical Exam  Constitutional: She is oriented to person, place, and time. She appears well-developed and well-nourished. No distress.  HENT:  Right Ear: External ear normal.  Left Ear: External ear normal.  Nose: Nose normal.  Mouth/Throat: Oropharynx is clear and moist.  Eyes: Conjunctivae are normal.  Cardiovascular: Normal rate, regular rhythm, normal heart sounds and intact distal pulses.   Pulmonary/Chest: Effort normal and breath sounds normal. No respiratory distress. She has no wheezes. She has no rales.  Lymphadenopathy:    She has no cervical adenopathy.  Neurological: She is alert and oriented to person, place, and time.          Assessment & Plan:  She has a viral URI. Drink fluids and take Robitussin prn. She will start the Carvedilol as prescribed and we will also add Diltiazem 240 mg daily. She is scheduled for a BP follow up in the Cardiology office on 12-09-14, and she will have a stress test on 12-16-14.

## 2014-11-24 NOTE — Assessment & Plan Note (Signed)
Low LDL (29), HDL 47

## 2014-11-24 NOTE — Progress Notes (Signed)
Pre visit review using our clinic review tool, if applicable. No additional management support is needed unless otherwise documented below in the visit note. 

## 2014-11-24 NOTE — Assessment & Plan Note (Signed)
Seen in ED Sunday. "thought it was my heart"

## 2014-11-24 NOTE — Assessment & Plan Note (Signed)
Type 2 DM

## 2014-11-24 NOTE — Patient Instructions (Addendum)
Medication Instructions:  Your physician recommends that you continue on your current medications as directed. Please refer to the Current Medication list given to you today.  Labwork: NONE      Testing/Procedures: Your physician has requested that you have a lexiscan myoview. For further information please visit [email or web address]. Please follow instruction sheet, as given.  Follow-Up: Your physician recommends that you schedule a follow-up appointment in: 3 months with Dr. Burt Knack. Your physician recommends that you schedule a follow-up appointment in: 2 weeks with a nurse room visit for BP check.  Any Other Special Instructions Will Be Listed Below (If Applicable).  When taking Nitroglycerin, please remember that this is for chest pressure and pain only. Take one tablet under the tongue for chest pain. If these does not relieve pain you can take another tablet after 5 minutes, and you can repeat and have another tablet after 5 minutes if chest pain is not relieved. Do not take more than 3 tablets five minutes apart. If chest pain is not relieved after 3 tablets call 911.

## 2014-11-24 NOTE — Progress Notes (Signed)
11/24/2014 [name]   Nov 10, 1945  [id number]  Primary Physician Laurey Morale, MD Primary Cardiologist: Dr Burt Knack  HPI:  69 y/o female seen in the past by Dr Burt Knack. She had an RCA DES in June 2010. She had atypical symptoms then with tongue numbness, "thought it was my heart". She had residual LAD and Dx disease then that has been treated medically. She has done well until this past Sunday when she went to the ED with complaints of localized Lt chest pain "thougt it was my heart". She had an elevated D dimer but negative CTA. Troponin were negative. She admits she has had a recent cough.    Current Outpatient Prescriptions  Medication Sig Dispense Refill  . anastrozole (ARIMIDEX) 1 MG tablet Take 1 tablet (1 mg total) by mouth daily. 30 tablet 5  . aspirin 81 MG tablet Take 81 mg by mouth daily.     . carvedilol (COREG) 25 MG tablet Take 25 mg by mouth 2 (two) times daily with a meal.    . diclofenac sodium (VOLTAREN) 1 % GEL Apply 2 g topically 4 (four) times daily as needed (for pain).     Marland Kitchen ergocalciferol (VITAMIN D2) 50000 UNITS capsule Take 50,000 Units by mouth once a week. On Saturday.    . ferrous sulfate 325 (65 FE) MG tablet Take 325 mg by mouth daily with breakfast.    . furosemide (LASIX) 40 MG tablet Take 40 mg by mouth daily as needed for fluid.     Marland Kitchen glucose blood test strip 1 each by Other route See admin instructions. Check blood sugar once daily.    . hydrALAZINE (APRESOLINE) 100 MG tablet Take 100 mg by mouth 3 (three) times daily.    Marland Kitchen HYDROcodone-acetaminophen (NORCO) 5-325 MG per tablet Take 1-2 tablets by mouth every 6 (six) hours as needed for moderate pain or severe pain. 30 tablet 0  . insulin aspart (NOVOLOG) 100 UNIT/ML injection Inject 10 Units into the skin 3 (three) times daily with meals.     . insulin glargine (LANTUS) 100 UNIT/ML injection Inject 30 Units into the skin daily.     . irbesartan (AVAPRO) 300 MG tablet Take 300 mg by mouth daily.      . isosorbide mononitrate (IMDUR) 60 MG 24 hr tablet Take 60 mg by mouth daily.    Marland Kitchen latanoprost (XALATAN) 0.005 % ophthalmic solution Place 1 drop into both eyes daily.     . meloxicam (MOBIC) 15 MG tablet Take 15 mg by mouth daily.     . metoprolol succinate (TOPROL-XL) 100 MG 24 hr tablet Take 100 mg by mouth daily. Take with or immediately following a meal.    . nitroGLYCERIN (NITROSTAT) 0.4 MG SL tablet Place 0.4 mg under the tongue every 5 (five) minutes as needed for chest pain.    . pantoprazole (PROTONIX) 40 MG tablet Take 1 tablet (40 mg total) by mouth daily. 90 tablet 3  . rosuvastatin (CRESTOR) 40 MG tablet Take 40 mg by mouth daily.     No current facility-administered medications for this visit.    No Known Allergies  History   Social History  . Marital Status: Married    Spouse Name: N/A  . Number of Children: N/A  . Years of Education: N/A   Occupational History  . Not on file.   Social History Main Topics  . Smoking status: Former Smoker    Quit date: 01/22/1967  . Smokeless tobacco: Never Used  Comment: non smoker  . Alcohol Use: No  . Drug Use: No  . Sexual Activity: No     Comment: HYST   Other Topics Concern  . Not on file   Social History Narrative     Review of Systems: General: negative for chills, fever, night sweats or weight changes.  Cardiovascular: negative for chest pain, dyspnea on exertion, edema, orthopnea, palpitations, paroxysmal nocturnal dyspnea or shortness of breath Dermatological: negative for rash Respiratory: negative for cough or wheezing Urologic: negative for hematuria Abdominal: negative for nausea, vomiting, diarrhea, bright red blood per rectum, melena, or hematemesis Neurologic: negative for visual changes, syncope, or dizziness All other systems reviewed and are otherwise negative except as noted above.    Blood pressure 184/90, pulse 70, height 5\' 4"  (1.626 m), weight 197 lb 6.4 oz (89.54 kg).  General  appearance: alert, cooperative and no distress Neck: no carotid bruit, no JVD and RCE scar Lungs: clear to auscultation bilaterally Heart: regular rate and rhythm, S1, S2 normal, no murmur, click, rub or gallop Extremities: extremities normal, atraumatic, no cyanosis or edema Pulses: 2+ and symmetric Skin: Skin color, texture, turgor normal. No rashes or lesions Neurologic: Grossly normal  EKG NSR without acute changes  ASSESSMENT AND PLAN:   Chest pain with moderate risk of acute coronary syndrome Seen in ED Sunday. "thought it was my heart"   CAD S/P RCA DES June 2010 Residual LAD/Dx disease   Essential hypertension Not well controlled, Toprol changed to Coreg   Type 2 diabetes mellitus with circulatory disorder Type 2 DM   Dyslipidemia Low LDL (29), HDL 47   Fibromyalgia .    PLAN  I am concerned about her symptoms, she hasn't been a complainer and her symptoms have been atypical in the past with only a vague sensation that something was wrong with her heart. She has multiple risk factors for progression including DM. I ordered a [employer], if negative she can f/u with Dr Burt Knack in three months. She needs a repeat B/P in two weeks. Her Toprol was changed to Coreg Sunday.   [name] KPA-C 11/24/2014 10:51 AM

## 2014-11-26 ENCOUNTER — Telehealth: Payer: Self-pay | Admitting: Family Medicine

## 2014-11-26 MED ORDER — AZITHROMYCIN 250 MG PO TABS: ORAL_TABLET | ORAL | Status: DC

## 2014-11-26 NOTE — Telephone Encounter (Signed)
I sent script e-scribe and left a voice message for pt with this information.  

## 2014-11-26 NOTE — Telephone Encounter (Signed)
Pt saw dr fry on 5/18 and also had upper resp issue.   Pt states she thought dr fry was going to call her in something for her snuffy, runny nose, cough, achey body  Pt is worse and would like a prescription called in. [facility] Pt would like a cb

## 2014-11-26 NOTE — Telephone Encounter (Signed)
Call in a Zpack  ?

## 2014-11-30 ENCOUNTER — Ambulatory Visit: Payer: Medicare Other

## 2014-12-07 DIAGNOSIS — H2512 Age-related nuclear cataract, left eye: Secondary | ICD-10-CM | POA: Diagnosis not present

## 2014-12-08 ENCOUNTER — Encounter: Payer: Self-pay | Admitting: Cardiovascular Disease

## 2014-12-08 ENCOUNTER — Encounter: Payer: Self-pay | Admitting: Cardiology

## 2014-12-08 HISTORY — PX: EYE SURGERY: SHX253

## 2014-12-09 ENCOUNTER — Ambulatory Visit (INDEPENDENT_AMBULATORY_CARE_PROVIDER_SITE_OTHER): Payer: Medicare Other | Admitting: Internal Medicine

## 2014-12-09 VITALS — BP 116/49 | HR 52 | Ht 64.0 in | Wt 193.1 lb

## 2014-12-09 DIAGNOSIS — I10 Essential (primary) hypertension: Secondary | ICD-10-CM | POA: Diagnosis not present

## 2014-12-09 DIAGNOSIS — Z9861 Coronary angioplasty status: Secondary | ICD-10-CM | POA: Diagnosis not present

## 2014-12-09 DIAGNOSIS — I2510 Atherosclerotic heart disease of native coronary artery without angina pectoris: Secondary | ICD-10-CM

## 2014-12-09 MED ORDER — METHYLDOPA 250 MG PO TABS: 250.0000 mg | ORAL_TABLET | Freq: Two times a day (BID) | ORAL | Status: DC

## 2014-12-09 NOTE — Patient Instructions (Addendum)
Medication Instructions:  Your physician has recommended you make the following change in your medication:  1) STOP Diltiazem 2) START Methyldopa 250 mg twice daily  Labwork: None ordered  Testing/Procedures: None ordered  Follow-Up: Your physician recommends that you schedule a follow-up appointment in: 2 weeks with a PA/NP.  Thank you for choosing [facility]!!       Methyldopa tablets What is this medicine? METHYLDOPA (meth ill DOE pa) is used to treat high blood pressure. This medicine may be used for other purposes; ask your health care provider or pharmacist if you have questions. COMMON BRAND NAME(S): Aldomet What should I tell my health care provider before I take this medicine? They need to know if you have any of these conditions: -anemia -kidney or liver disease -an unusual or allergic reaction to methyldopa, other medicines, foods, dyes, or preservatives -pregnant or trying to get pregnant -breast-feeding How should I use this medicine? Take this medicine by mouth with a glass of water. Follow the directions on the prescription label. Take your doses at regular intervals. Do not take your medicine more often than directed. Do not stop taking except on the advice of your doctor or health care professional. Talk to your pediatrician regarding the use of this medicine in children. Special care may be needed. Overdosage: If you think you have taken too much of this medicine contact a poison control center or emergency room at once. NOTE: This medicine is only for you. Do not share this medicine with others. What if I miss a dose? If you miss a dose, take it as soon as you can. If it is almost time for your next dose, take only that dose. Do not take double or extra doses. What may interact with this medicine? Do not take this medicine with any of the following medications: -MAOIs like Carbex, Eldepryl, Marplan, Nardil, and Parnate This medicine also may  interact with the following medications: -iron salts -lithium -medicines for high blood pressure This list may not describe all possible interactions. Give your health care provider a list of all the medicines, herbs, non-prescription drugs, or dietary supplements you use. Also tell them if you smoke, drink alcohol, or use illegal drugs. Some items may interact with your medicine. What should I watch for while using this medicine? Visit your doctor or health care professional for regular checks on your progress. Check your heart rate and blood pressure regularly. Ask your doctor or health care professional what your heart rate should be and when you should contact him or her. If you get a fever, especially in the first few months, call your doctor or health care professional. Do not treat yourself. You may get drowsy or dizzy. Do not drive, use machinery, or do anything that needs mental alertness until you know how this medicine affects you. To avoid dizzy or fainting spells, do not stand or sit up quickly, especially if you are an older person. Alcohol can make you more drowsy and dizzy. Avoid alcoholic drinks. Your mouth may get dry. Chewing sugarless gum or sucking hard candy, and drinking plenty of water may help. Contact your doctor if the problem does not go away or is severe. Iron can stop the absorption of this medicine. Do not take this medicine with iron preparations or multiple vitamins containing iron. If you have to take iron, make sure that there are at least 2 hours between iron and methyldopa doses. What side effects may I notice from receiving this  medicine? Side effects that you should report to your doctor or health care professional as soon as possible: -allergic reactions like skin rash, itching or hives, swelling of the face, lips, or tongue -black, sore tongue -chest pain -dark yellow or brown urine -depression -difficulty sleeping, nightmares -fever (usually within the  first 3 months of treatment) -slow heartbeat -stomach pain -swelling of the feet or legs -unusually weak or tired -yellowing of the eyes or skin Side effects that usually do not require medical attention (report to your doctor or health care professional if they continue or are bothersome): -abnormal production of milk in females -breast enlargement in both males and females -change in sex drive or performance -diarrhea -headache -nausea, vomiting -numbness or tingling in hands or feet This list may not describe all possible side effects. Call your doctor for medical advice about side effects. You may report side effects to FDA at 1-800-FDA-1088. Where should I keep my medicine? Keep out of the reach of children. Store at room temperature between 15 and 30 degrees C (59 and 86 degrees F). Keep container tightly closed. Throw away any unused medicine after the expiration date. NOTE: This sheet is a summary. It may not cover all possible information. If you have questions about this medicine, talk to your doctor, pharmacist, or health care provider.  2015, Elsevier/Gold Standard. (2008-01-12 14:04:24)

## 2014-12-10 NOTE — Progress Notes (Signed)
Patient came in today for BP check.  Patient was seen by PA on 5/18 with hypertension.  Ordered to change from Toprol to Coreg.  Patient then saw their PCP, afterwards, on the same day who started her on Diltiazem. Patient presents today with normal blood pressure, but complains of fatigue, dizziness, and LE edema.  She is also bradycardic. Reviewed with DOD Dr. Harrington Challenger.  Orders to d/c diltiazem, start [place].  (patient was unable to take Amlodipine secondary to previous edema on this medication)

## 2014-12-13 ENCOUNTER — Other Ambulatory Visit: Payer: Self-pay | Admitting: Gynecology

## 2014-12-14 ENCOUNTER — Telehealth (HOSPITAL_COMMUNITY): Payer: Self-pay

## 2014-12-14 NOTE — Telephone Encounter (Signed)
Patient given detailed instructions per Myocardial Perfusion Study Information Sheet for test on 12-16-2014 at 8:30am. Patient verbalized understanding. Oletta Lamas, Toyna Erisman A

## 2014-12-16 ENCOUNTER — Ambulatory Visit (HOSPITAL_COMMUNITY): Payer: Medicare Other | Attending: Internal Medicine

## 2014-12-16 DIAGNOSIS — I2510 Atherosclerotic heart disease of native coronary artery without angina pectoris: Secondary | ICD-10-CM | POA: Insufficient documentation

## 2014-12-16 DIAGNOSIS — Z9861 Coronary angioplasty status: Secondary | ICD-10-CM | POA: Diagnosis not present

## 2014-12-16 DIAGNOSIS — M25651 Stiffness of right hip, not elsewhere classified: Secondary | ICD-10-CM | POA: Diagnosis not present

## 2014-12-16 DIAGNOSIS — M545 Low back pain: Secondary | ICD-10-CM | POA: Diagnosis not present

## 2014-12-16 DIAGNOSIS — M25652 Stiffness of left hip, not elsewhere classified: Secondary | ICD-10-CM | POA: Diagnosis not present

## 2014-12-16 DIAGNOSIS — M62451 Contracture of muscle, right thigh: Secondary | ICD-10-CM | POA: Diagnosis not present

## 2014-12-16 MED ORDER — TECHNETIUM TC 99M SESTAMIBI GENERIC - CARDIOLITE
11.0000 | Freq: Once | INTRAVENOUS | Status: AC | PRN
  Administered 2014-12-16: 11 via INTRAVENOUS

## 2014-12-17 ENCOUNTER — Telehealth: Payer: Self-pay | Admitting: *Deleted

## 2014-12-17 DIAGNOSIS — N6092 Unspecified benign mammary dysplasia of left breast: Secondary | ICD-10-CM

## 2014-12-17 MED ORDER — TAMOXIFEN CITRATE 20 MG PO TABS: 20.0000 mg | ORAL_TABLET | Freq: Every day | ORAL | Status: DC

## 2014-12-17 NOTE — Telephone Encounter (Signed)
I spoke with pt, and recommend her to switch to Tamoxifen in 2-4 weeks when she recovers well from the side effects of Anastrozole. Potential side effects of tamoxifen were reviewed with patient, she agrees. Prescription will be called into her pharmacy today.  Truitt Merle  12/17/2014

## 2014-12-17 NOTE — Telephone Encounter (Signed)
Pt called & reports that she can't tol the arimidex, stating I can't deal with it.  She reports that her knees hurt & she can barely walk & is staying in the bed a lot b/c she feels dizzy & drunk.  She also reports hot flashes & has no life.  She states that she tried taking the arimidex at hs but it didn't make a difference.  She would like to try tamoxifen instead or does Dr Burr Medico have any other suggestions?  Message to Dr. Burr Medico.

## 2014-12-20 ENCOUNTER — Ambulatory Visit (HOSPITAL_COMMUNITY): Payer: Medicare Other | Attending: Cardiovascular Disease

## 2014-12-20 DIAGNOSIS — I2510 Atherosclerotic heart disease of native coronary artery without angina pectoris: Secondary | ICD-10-CM | POA: Diagnosis not present

## 2014-12-20 DIAGNOSIS — Z9862 Peripheral vascular angioplasty status: Secondary | ICD-10-CM | POA: Diagnosis not present

## 2014-12-20 MED ORDER — TECHNETIUM TC 99M SESTAMIBI GENERIC - CARDIOLITE
33.0000 | Freq: Once | INTRAVENOUS | Status: AC | PRN
  Administered 2014-12-20: 33 via INTRAVENOUS

## 2014-12-20 MED ORDER — REGADENOSON 0.4 MG/5ML IV SOLN
0.4000 mg | Freq: Once | INTRAVENOUS | Status: AC
  Administered 2014-12-20: 0.4 mg via INTRAVENOUS

## 2014-12-21 DIAGNOSIS — M545 Low back pain: Secondary | ICD-10-CM | POA: Diagnosis not present

## 2014-12-21 DIAGNOSIS — M25652 Stiffness of left hip, not elsewhere classified: Secondary | ICD-10-CM | POA: Diagnosis not present

## 2014-12-21 DIAGNOSIS — M25651 Stiffness of right hip, not elsewhere classified: Secondary | ICD-10-CM | POA: Diagnosis not present

## 2014-12-21 DIAGNOSIS — M62451 Contracture of muscle, right thigh: Secondary | ICD-10-CM | POA: Diagnosis not present

## 2014-12-21 LAB — MYOCARDIAL PERFUSION IMAGING
LV dias vol: 113 mL
LV sys vol: 42 mL
Nuc Stress EF: 63 %
Peak HR: 71 {beats}/min
RATE: 0.33
Rest HR: 56 {beats}/min
SDS: 3
SRS: 3
SSS: 3
TID: 0.97

## 2014-12-23 ENCOUNTER — Telehealth: Payer: Self-pay | Admitting: Cardiovascular Disease

## 2014-12-23 DIAGNOSIS — M25652 Stiffness of left hip, not elsewhere classified: Secondary | ICD-10-CM | POA: Diagnosis not present

## 2014-12-23 DIAGNOSIS — M25651 Stiffness of right hip, not elsewhere classified: Secondary | ICD-10-CM | POA: Diagnosis not present

## 2014-12-23 DIAGNOSIS — M62451 Contracture of muscle, right thigh: Secondary | ICD-10-CM | POA: Diagnosis not present

## 2014-12-23 DIAGNOSIS — M545 Low back pain: Secondary | ICD-10-CM | POA: Diagnosis not present

## 2014-12-23 NOTE — Telephone Encounter (Signed)
Pt is aware of stress  results. Pt is aware  Per Dr. Burt Knack, to keep her appointment with Ermalinda Barrios PA on 12/28/14 to discuss possible cardiac catheterization. Pt was upset, because of the stress test  abnormal results, and appointment, because she said that appointment was done prior of the stress test results.

## 2014-12-23 NOTE — Telephone Encounter (Signed)
New problem    Pt want to know results of her stress test. Please call pt.

## 2014-12-28 ENCOUNTER — Ambulatory Visit (INDEPENDENT_AMBULATORY_CARE_PROVIDER_SITE_OTHER): Payer: Medicare Other | Admitting: Family Medicine

## 2014-12-28 ENCOUNTER — Other Ambulatory Visit: Payer: Self-pay | Admitting: Physician Assistant

## 2014-12-28 ENCOUNTER — Encounter: Payer: Self-pay | Admitting: Family Medicine

## 2014-12-28 ENCOUNTER — Encounter: Payer: Self-pay | Admitting: Physician Assistant

## 2014-12-28 ENCOUNTER — Ambulatory Visit (INDEPENDENT_AMBULATORY_CARE_PROVIDER_SITE_OTHER): Payer: Medicare Other | Admitting: Physician Assistant

## 2014-12-28 ENCOUNTER — Encounter: Payer: Self-pay | Admitting: *Deleted

## 2014-12-28 VITALS — BP 138/54 | HR 63 | Ht 64.5 in | Wt 200.1 lb

## 2014-12-28 VITALS — BP 134/62 | HR 59 | Temp 98.4°F | Ht 64.0 in | Wt 200.0 lb

## 2014-12-28 DIAGNOSIS — F329 Major depressive disorder, single episode, unspecified: Secondary | ICD-10-CM

## 2014-12-28 DIAGNOSIS — R079 Chest pain, unspecified: Secondary | ICD-10-CM

## 2014-12-28 DIAGNOSIS — M797 Fibromyalgia: Secondary | ICD-10-CM

## 2014-12-28 DIAGNOSIS — Z9861 Coronary angioplasty status: Secondary | ICD-10-CM

## 2014-12-28 DIAGNOSIS — I2510 Atherosclerotic heart disease of native coronary artery without angina pectoris: Secondary | ICD-10-CM

## 2014-12-28 DIAGNOSIS — E559 Vitamin D deficiency, unspecified: Secondary | ICD-10-CM | POA: Diagnosis not present

## 2014-12-28 DIAGNOSIS — R9439 Abnormal result of other cardiovascular function study: Secondary | ICD-10-CM | POA: Diagnosis not present

## 2014-12-28 DIAGNOSIS — R011 Cardiac murmur, unspecified: Secondary | ICD-10-CM | POA: Diagnosis not present

## 2014-12-28 DIAGNOSIS — E1159 Type 2 diabetes mellitus with other circulatory complications: Secondary | ICD-10-CM | POA: Diagnosis not present

## 2014-12-28 DIAGNOSIS — F32A Depression, unspecified: Secondary | ICD-10-CM

## 2014-12-28 DIAGNOSIS — I10 Essential (primary) hypertension: Secondary | ICD-10-CM | POA: Diagnosis not present

## 2014-12-28 DIAGNOSIS — M5440 Lumbago with sciatica, unspecified side: Secondary | ICD-10-CM | POA: Diagnosis not present

## 2014-12-28 LAB — POCT URINALYSIS DIPSTICK
BILIRUBIN UA: NEGATIVE
GLUCOSE UA: NEGATIVE
Ketones, UA: NEGATIVE
NITRITE UA: NEGATIVE
RBC UA: NEGATIVE
Spec Grav, UA: 1.015
UROBILINOGEN UA: 1
pH, UA: 7

## 2014-12-28 LAB — CBC WITH DIFFERENTIAL/PLATELET
BASOS PCT: 0.2 % (ref 0.0–3.0)
Basophils Absolute: 0 10*3/uL (ref 0.0–0.1)
Eosinophils Absolute: 0.1 10*3/uL (ref 0.0–0.7)
Eosinophils Relative: 1.4 % (ref 0.0–5.0)
HCT: 34.4 % — ABNORMAL LOW (ref 36.0–46.0)
Hemoglobin: 11.1 g/dL — ABNORMAL LOW (ref 12.0–15.0)
LYMPHS PCT: 27.1 % (ref 12.0–46.0)
Lymphs Abs: 2 10*3/uL (ref 0.7–4.0)
MCHC: 32.1 g/dL (ref 30.0–36.0)
MCV: 84.3 fl (ref 78.0–100.0)
Monocytes Absolute: 0.4 10*3/uL (ref 0.1–1.0)
Monocytes Relative: 6 % (ref 3.0–12.0)
NEUTROS ABS: 4.8 10*3/uL (ref 1.4–7.7)
NEUTROS PCT: 65.3 % (ref 43.0–77.0)
Platelets: 176 10*3/uL (ref 150.0–400.0)
RBC: 4.08 Mil/uL (ref 3.87–5.11)
RDW: 19.3 % — AB (ref 11.5–15.5)
WBC: 7.4 10*3/uL (ref 4.0–10.5)

## 2014-12-28 LAB — BASIC METABOLIC PANEL
BUN: 23 mg/dL (ref 6–23)
BUN: 25 mg/dL — AB (ref 6–23)
CHLORIDE: 106 meq/L (ref 96–112)
CHLORIDE: 107 meq/L (ref 96–112)
CO2: 29 mEq/L (ref 19–32)
CO2: 27 mEq/L (ref 19–32)
CREATININE: 1.11 mg/dL (ref 0.40–1.20)
CREATININE: 1.27 mg/dL — AB (ref 0.40–1.20)
Calcium: 9.5 mg/dL (ref 8.4–10.5)
Calcium: 9.3 mg/dL (ref 8.4–10.5)
GFR: 63.04 mL/min (ref >60.00)
GFR: 53.97 mL/min — ABNORMAL LOW (ref >60.00)
GLUCOSE: 98 mg/dL (ref 70–99)
Glucose, Bld: 141 mg/dL — ABNORMAL HIGH (ref 70–99)
Potassium: 3.6 mEq/L (ref 3.5–5.1)
Potassium: 3.6 mEq/L (ref 3.5–5.1)
Sodium: 142 mEq/L (ref 135–145)
Sodium: 142 mEq/L (ref 135–145)

## 2014-12-28 LAB — CBC
HEMATOCRIT: 32.5 % — AB (ref 36.0–46.0)
HEMOGLOBIN: 10.5 g/dL — AB (ref 12.0–15.0)
MCHC: 32.3 g/dL (ref 30.0–36.0)
MCV: 83.8 fl (ref 78.0–100.0)
Platelets: 174 10*3/uL (ref 150.0–400.0)
RBC: 3.88 Mil/uL (ref 3.87–5.11)
RDW: 19.6 % — AB (ref 11.5–15.5)
WBC: 7.1 10*3/uL (ref 4.0–10.5)

## 2014-12-28 LAB — HEMOGLOBIN A1C: Hgb A1c MFr Bld: 7.3 % — ABNORMAL HIGH (ref 4.6–6.5)

## 2014-12-28 LAB — HEPATIC FUNCTION PANEL
ALBUMIN: 3.9 g/dL (ref 3.5–5.2)
ALK PHOS: 63 U/L (ref 39–117)
ALT: 15 U/L (ref 0–35)
AST: 19 U/L (ref 0–37)
BILIRUBIN DIRECT: 0.1 mg/dL (ref 0.0–0.3)
Total Bilirubin: 0.6 mg/dL (ref 0.2–1.2)
Total Protein: 6.9 g/dL (ref 6.0–8.3)

## 2014-12-28 LAB — VITAMIN D 25 HYDROXY (VIT D DEFICIENCY, FRACTURES): VITD: 55 ng/mL (ref 30.00–100.00)

## 2014-12-28 LAB — LIPID PANEL
CHOLESTEROL: 153 mg/dL (ref 0–200)
HDL: 35.7 mg/dL — AB (ref >39.00)
LDL Cholesterol: 94 mg/dL (ref 0–99)
NonHDL: 117.3
Total CHOL/HDL Ratio: 4
Triglycerides: 119 mg/dL (ref 0.0–149.0)
VLDL: 23.8 mg/dL (ref 0.0–40.0)

## 2014-12-28 LAB — TSH: TSH: 2.36 u[IU]/mL (ref 0.35–4.50)

## 2014-12-28 LAB — PROTIME-INR
INR: 1.1 ratio — AB (ref 0.8–1.0)
Prothrombin Time: 12.3 s (ref 9.6–13.1)

## 2014-12-28 MED ORDER — PANTOPRAZOLE SODIUM 40 MG PO TBEC: 40.0000 mg | DELAYED_RELEASE_TABLET | Freq: Every day | ORAL | Status: DC

## 2014-12-28 NOTE — Patient Instructions (Addendum)
Medication Instructions:    Labwork:  BMET CBC PT/INR   Testing/Procedures:  Your physician has requested that you have an echocardiogram. Echocardiography is a painless test that uses sound waves to create images of your heart. It provides your doctor with information about the size and shape of your heart and how well your heart's chambers and valves are working. This procedure takes approximately one hour. There are no restrictions for this procedure.   Your physician has requested that you have a cardiac catheterization.LETTER ATTACHED  Cardiac catheterization is used to diagnose and/or treat various heart conditions. Doctors may recommend this procedure for a number of different reasons. The most common reason is to evaluate chest pain. Chest pain can be a symptom of coronary artery disease (CAD), and cardiac catheterization can show whether plaque is narrowing or blocking your heart's arteries. This procedure is also used to evaluate the valves, as well as measure the blood flow and oxygen levels in different parts of your heart. For further information please visit HugeFiesta.[email or web address]. Please follow instruction sheet, as given.  Follow-Up:  2 WEEK POST [place] 3 TO 4 MONTHS   Any Other Special Instructions Will Be Listed Below (If Applicable).

## 2014-12-28 NOTE — Progress Notes (Signed)
Cardiology Office Note   Date:  12/28/2014   ID:  [name], DOB August 18, 1945, MRN [id number]  PCP:  Laurey Morale, MD  Cardiologist:  Dr. Burt Knack Chief Complaint:chest pain    History of Present Illness: [name] is a 69 y.o. female who presents for follow-up of stress Myoview. Patient has history of DES to the RCA in June 2010. She had atypical symptoms then with tongue numbness. She has residual LAD and diagonal disease. She was recently in the emergency room with chest pain. She had an elevated d-dimer but negative CTA. Troponins were negative. Her Toprol was changed to Coreg. She saw Kerin Ransom in follow-up still complaining of some typical and atypical symptoms and he ordered a stress Myoview. This showed a possible small defect in the inferolateral wall. Ischemia cannot be completely ruled out but there is significant diaphragmatic and soft tissue attenuation and are a W images. Subtle low risk study overall systolic function is normal EF 55-65%. Dr. Burt Knack reviewed this am recommends cardiac catheterization.  Since then her PCP placed her on diltiazem as well as Coreg. She became fatigued, dizzy and developed lower extremity edema and bradycardia. Dr. Harrington Challenger stopped her diltiazem and start methyldopa.  Patient also has history of hypertension, and history of TIA.She just had eye surgery and is on sterile eyedrops.  Patient comes in today complaining of a low soreness under her left breast but overall feeling fine. She denies any chest tightness, pressure, dyspnea, dyspnea on exertion, dizziness or presyncope. She is just recovering from her recent eye surgery.    Past Medical History  Diagnosis Date  . HTN (hypertension)   . Hyperlipidemia   . TIA (transient ischemic attack)     also Hx of it.   Marland Kitchen GERD (gastroesophageal reflux disease)   . Headache(784.0)   . Anemia, unspecified   . Edema   . Depressive disorder, not elsewhere classified   . Low back pain   .  Obesity   . Duodenal nodule   . Hemorrhoids   . Arthritis   . Glaucoma     narrow angle, sees Dr. Herbert Deaner   . Type II or unspecified type diabetes mellitus without mention of complication, not stated as uncontrolled     sees Dr. Carrolyn Meiers   . Gastric polyps     COLON  . Allergy   . CAD (coronary artery disease)     sees. Dr. Burt Knack. s/p cath with PCI of RCA (xience des) June 2010. Pt also with LAD and D2 dzs-NL FFR in both areas med Rx  . Peripheral vascular disease   . Stroke     TIA  "many yrs ago"    . HSV (herpes simplex virus) anogenital infection 05/2014  . Cataract     sees Dr. Herbert Deaner     Past Surgical History  Procedure Laterality Date  . Bilateral carpal tunnel release    . Colonoscopy  05-29-11    per Dr. Henrene Pastor, benign polyps, repeat in 5 yrs    . Esophagogastroduodenoscopy  02-08-06    per Dr. Henrene Pastor, gastritis   . Glaucoma surgery      with laser, per Dr. Henrene Pastor   . Carotid endarterectomy  march 2012    per Dr. Morton Amy  . Abdominal hysterectomy  age 16    TAH and USO  Leiomyomata  . Back surgery  2008    lumb fusion  . Cholecystectomy    . Dilation and curettage of uterus    .  Breast biopsy Left 01/26/2014    Procedure: EXCISION LEFT BREAST MASS;  Surgeon: Adin Hector, MD;  Location: [facility];  Service: General;  Laterality: Left;  Marland Kitchen Eye surgery    . Cardiac catheterization    . Carpal tunnel release Left 05/14/2014    Procedure: Left Carpal tunnel release;  Surgeon: Ashok Pall, MD;  Location: [facility];  Service: Neurosurgery;  Laterality: Left;  Left Carpal tunnel release  . Ulnar nerve transposition Left 05/14/2014    Procedure: Left Ulnar nerve decompression;  Surgeon: Ashok Pall, MD;  Location: [place] [facility];  Service: Neurosurgery;  Laterality: Left;  Left Ulnar nerve decompression  . Anterior cervical corpectomy N/A 06/11/2014    Procedure: ANTERIOR CERVICAL CORPECTOMY CERVICAL SIX; WITH CERVICAL FIVE TO CERVICAL SEVEN  ARTHRODESIS;  Surgeon: Ashok Pall, MD;  Location: [facility];  Service: Neurosurgery;  Laterality: N/A;     Current Outpatient Prescriptions  Medication Sig Dispense Refill  . anastrozole (ARIMIDEX) 1 MG tablet Take 1 tablet (1 mg total) by mouth daily. 30 tablet 5  . aspirin 81 MG tablet Take 81 mg by mouth daily.     . carvedilol (COREG) 25 MG tablet Take 25 mg by mouth 2 (two) times daily with a meal.    . diclofenac sodium (VOLTAREN) 1 % GEL Apply 2 g topically 4 (four) times daily as needed (for pain).     . dorzolamide (TRUSOPT) 2 % ophthalmic solution Place 1 drop into the left eye 3 (three) times daily.    . DUREZOL 0.05 % EMUL Place 1 drop into the left eye 3 (three) times daily.    . ergocalciferol (VITAMIN D2) 50000 UNITS capsule Take 50,000 Units by mouth once a week. On Saturday.    . furosemide (LASIX) 40 MG tablet Take 40 mg by mouth daily as needed for fluid.     Marland Kitchen glucose blood test strip 1 each by Other route See admin instructions. Check blood sugar once daily.    . hydrALAZINE (APRESOLINE) 100 MG tablet Take 100 mg by mouth 3 (three) times daily.    Marland Kitchen HYDROcodone-acetaminophen (NORCO) 5-325 MG per tablet Take 1-2 tablets by mouth every 6 (six) hours as needed for moderate pain or severe pain. 30 tablet 0  . insulin aspart (NOVOLOG) 100 UNIT/ML injection Inject 10 Units into the skin 3 (three) times daily with meals.     . insulin glargine (LANTUS) 100 UNIT/ML injection Inject 30 Units into the skin daily.     . irbesartan (AVAPRO) 300 MG tablet Take 300 mg by mouth daily.    . isosorbide mononitrate (IMDUR) 60 MG 24 hr tablet Take 60 mg by mouth daily.    Marland Kitchen latanoprost (XALATAN) 0.005 % ophthalmic solution Place 1 drop into both eyes daily.     . meloxicam (MOBIC) 15 MG tablet Take 15 mg by mouth daily.     . methyldopa (ALDOMET) 250 MG tablet Take 1 tablet (250 mg total) by mouth 2 (two) times daily. 60 tablet 1  . metoprolol succinate (TOPROL-XL) 100 MG 24 hr tablet  Take 100 mg by mouth daily.    . nitroGLYCERIN (NITROSTAT) 0.4 MG SL tablet Place 1 tablet (0.4 mg total) under the tongue every 5 (five) minutes as needed for chest pain. 25 tablet 1  . pantoprazole (PROTONIX) 40 MG tablet Take 1 tablet (40 mg total) by mouth daily. 90 tablet 3  . rosuvastatin (CRESTOR) 40 MG tablet Take 40 mg by mouth  daily.    . spironolactone (ALDACTONE) 25 MG tablet Take 25 mg by mouth daily.    . tamoxifen (NOLVADEX) 20 MG tablet Take 1 tablet (20 mg total) by mouth daily. 30 tablet 2  . traMADol (ULTRAM) 50 MG tablet Take 50 mg by mouth as needed.     No current facility-administered medications for this visit.    Allergies:   Review of patient's allergies indicates no known allergies.    Social History:  The patient  reports that she quit smoking about 47 years ago. She has never used smokeless tobacco. She reports that she does not drink alcohol or use illicit drugs.   Family History:  [name]    family history includes Cancer in her brother and father; Diabetes in her brother; Heart disease in her brother; Hyperlipidemia in her sister; Hypertension in her brother, mother, and sister; Varicose Veins in her brother. There is no history of Coronary artery disease, Colon cancer, Esophageal cancer, or Stomach cancer.    ROS:  Please see the history of present illness.   Otherwise, review of systems are positive for occasional leg swelling, depression, chronic back pain, occasional dizziness.   All other systems are reviewed and negative.    PHYSICAL EXAM: VS:  BP 138/54 mmHg  Pulse 63  Ht 5' 4.5" (1.638 m)  Wt 200 lb 1.9 oz (90.774 kg)  BMI 33.83 kg/m2 , BMI Body mass index is 33.83 kg/(m^2). GEN: Obese, in no acute distress Neck: no JVD, HJR, carotid bruits, or masses Cardiac:  RRR; 2/6 systolic murmur at the left sternal border, no gallop, rubs, thrill or heave,  Respiratory:  clear to auscultation bilaterally, normal work of breathing GI: soft,  nontender, nondistended, + BS MS: no deformity or atrophy Extremities: without cyanosis, clubbing, edema, good distal pulses bilaterally.  Skin: warm and dry, no rash Neuro:  Strength and sensation are intact    EKG:  EKG is ordered today. The ekg ordered today demonstrates normal sinus rhythm with PVC no acute change   Recent Labs: 01/28/2014: TSH 0.97 11/23/2014: ALT 17; BUN 15.6; Creatinine 0.9; HGB 12.2; Platelets 184; Potassium 3.9; Sodium 147*    Lipid Panel    Component Value Date/Time   CHOL 111 01/28/2014 1105   TRIG 174.0* 01/28/2014 1105   HDL 46.90 01/28/2014 1105   CHOLHDL 2 01/28/2014 1105   VLDL 34.8 01/28/2014 1105   LDLCALC 29 01/28/2014 1105      Wt Readings from Last 3 Encounters:  12/28/14 200 lb 1.9 oz (90.774 kg)  12/28/14 200 lb (90.719 kg)  12/16/14 197 lb (89.359 kg)      Other studies Reviewed: Additional studies/ records that were reviewed today include and review of the records demonstrates:   Lexi scan Myoview 6/13/16Stress Perfusion There is a defect present in the basal inferolateral, mid inferolateral and apical lateral location.   Perfusion Summary Defect 1:  There is a small defect of mild severity present in the basal inferolateral, mid inferolateral and apical lateral location. The defect is reversible.   Study Impression Myocardial perfusion is abnormal. This a technically difficult study to interpret. The appears to be some motion artifact on the stress images and overall counts appear lower on the stress images compared to the rest images. There is a possible small defect in the inferolateral wall. Ischemia cannot be completely ruled out but there is significant diaphragmatic and soft tissue attenuation on RAW images noted.  This is a low risk study. Overall left  ventricular systolic function was normal. LV cavity size is normal. The left ventricular ejection fraction is normal (55-65%). There are no images available for  comparison.  From: ACCF/SCAI/STS/AATS/AHA/ASNC/HFSA/SCCT 2012 Appropriate Use Criteria for Coronary Revascularization Focused Update  Cardiac cath 2010 DESCRIPTION OF PROCEDURE:  The risks and indications were explained,  consent was signed and placed on the chart.  A 5-French arterial sheath  was placed in the right femoral artery using a modified Seldinger  technique.  Standard catheters including a JL-4, JR-4, and angled  pigtail were used, all catheter exchanges made over a wire, there were  no apparent complications.    1. Central aortic pressure 126/65 with a mean of 88.  LV 139/11 with      an EDP of 18.  There was no aortic stenosis.  2. The left main was normal.  3. The LAD was a long vessel coursing to the apex.  It gave off 2      diagonals.  In the proximal to mid section, there was a long 50%      tubular stenosis.  This spanned both diagonals.  In the midsection,      there was a 30% stenosis.  In the second diagonal, which was a      large vessel, there was a 60% ostial lesion followed by an 80%      lesion in the midportion.  4. The left circumflex was a moderate-sized vessel, gave off a large      OM-1 and a small OM-2.  There was a 30% lesion in the mid AV groove      circ.  5. The right coronary artery was a large dominant vessel, gave off an      RV branch, a large PDA, and a posterolateral.  There was a focal      95% lesion in the mid section of the RCA just after the takeoff of      the RV branch.    Left ventriculogram done in the RAO projection showed an EF of 65 to  70%.  There was no regional wall motion abnormalities.    ASSESSMENT:  1. Two-vessel coronary artery disease as described above.  2. Normal left ventricular (LV) function.    Plan will be for percutaneous intervention on the mid RCA tomorrow  plus/minus the second diagonal.  We will stop her Aggrenox and load her  on Plavix.     ASSESSMENT AND PLAN: Chest pain with moderate risk of  acute coronary syndrome Patient has been to the emergency room with chest pain and has had taped typical symptoms but also had a typical symptoms when she had her stent in 2010. Lexi scan Myoview shows a small defect in mild severity present in the basal inferior and mid inferolateral and apical lateral location the defect is reversible. It's felt to be a low risk study overall ejection fraction 55-65%. Dr. Burt Knack reviewed this and recommends heart catheterization. I explained the risks including, MI, CVA and benefits of this to the patient and she is agreeable to proceed. She will be scheduled for tomorrow morning.  Essential hypertension Blood pressure better controlled  Type 2 diabetes mellitus with circulatory disorder We'll have patient take half the dose of her Lantus insulin and hold her NovoLog the day of admission     Signed, Ermalinda Barrios, PA-C  12/28/2014 1:47 PM    [facility] [address], [place], [place]  [address] Phone: [phone];  Fax: [phone]

## 2014-12-28 NOTE — Assessment & Plan Note (Signed)
We'll have patient take half the dose of her Lantus insulin and hold her NovoLog the day of admission

## 2014-12-28 NOTE — Progress Notes (Signed)
   Subjective:    Patient ID: [name], female    DOB: 12-09-1945, 69 y.o.   MRN: [id number]  HPI 69 yr old female to review several issues. She recently had a myocardial perfusion stress test which showed a possible inferolateral lesion. She is scheduled to see Dr. Burt Knack soon to discuss what the next step should be. She denies any chest pain or SOB. Her am fasting glucoses have been between 100 and 120. Her cataracts are stable. Her BP is stable at home. I reviewed her medical, surgical, and family hx today and there are no changes.    Review of Systems  Constitutional: Negative.   HENT: Negative.   Eyes: Negative.   Respiratory: Negative.   Cardiovascular: Negative.   Gastrointestinal: Negative.   Genitourinary: Negative for dysuria, urgency, frequency, hematuria, flank pain, decreased urine volume, enuresis, difficulty urinating, pelvic pain and dyspareunia.  Musculoskeletal: Negative.   Skin: Negative.   Neurological: Negative.   Psychiatric/Behavioral: Negative.        Objective:   Physical Exam  Constitutional: She is oriented to person, place, and time. She appears well-developed and well-nourished. No distress.  HENT:  Head: Normocephalic and atraumatic.  Right Ear: External ear normal.  Left Ear: External ear normal.  Nose: Nose normal.  Mouth/Throat: Oropharynx is clear and moist. No oropharyngeal exudate.  Eyes: Conjunctivae and EOM are normal. Pupils are equal, round, and reactive to light. No scleral icterus.  Neck: Normal range of motion. Neck supple. No JVD present. No thyromegaly present.  Cardiovascular: Normal rate, regular rhythm, normal heart sounds and intact distal pulses.  Exam reveals no gallop and no friction rub.   No murmur heard. Pulmonary/Chest: Effort normal and breath sounds normal. No respiratory distress. She has no wheezes. She has no rales. She exhibits no tenderness.  Abdominal: Soft. Bowel sounds are normal. She exhibits no distension  and no mass. There is no tenderness. There is no rebound and no guarding.  Musculoskeletal: Normal range of motion. She exhibits no edema or tenderness.  Lymphadenopathy:    She has no cervical adenopathy.  Neurological: She is alert and oriented to person, place, and time. She has normal reflexes. No cranial nerve deficit. She exhibits normal muscle tone. Coordination normal.  Skin: Skin is warm and dry. No rash noted. No erythema.  Psychiatric: She has a normal mood and affect. Her behavior is normal. Judgment and thought content normal.          Assessment & Plan:  Her HTN is stable and she will see Dr. Burt Knack soon to go over her CAD and her stress test results. She will see Dr. Forde Dandy soon about her diabetes but we will get some labs today for this. Her fibromyalgia is stable.

## 2014-12-28 NOTE — Progress Notes (Signed)
Pre visit review using our clinic review tool, if applicable. No additional management support is needed unless otherwise documented below in the visit note. 

## 2014-12-28 NOTE — Assessment & Plan Note (Signed)
Patient has been to the emergency room with chest pain and has had taped typical symptoms but also had a typical symptoms when she had her stent in 2010. Lexi scan Myoview shows a small defect in mild severity present in the basal inferior and mid inferolateral and apical lateral location the defect is reversible. It's felt to be a low risk study overall ejection fraction 55-65%. Dr. Burt Knack reviewed this and recommends heart catheterization. I explained the risks including, MI, CVA and benefits of this to the patient and she is agreeable to proceed. She will be scheduled for tomorrow morning.

## 2014-12-28 NOTE — Assessment & Plan Note (Signed)
Blood pressure better controlled ?

## 2014-12-29 ENCOUNTER — Ambulatory Visit (HOSPITAL_COMMUNITY)
Admission: RE | Admit: 2014-12-29 | Discharge: 2014-12-29 | Disposition: A | Discharge status: Home / Self Care | Payer: Medicare Other | Source: Ambulatory Visit | Attending: Cardiovascular Disease | Admitting: Cardiovascular Disease

## 2014-12-29 ENCOUNTER — Encounter (HOSPITAL_COMMUNITY): Payer: Self-pay | Admitting: Cardiovascular Disease

## 2014-12-29 ENCOUNTER — Encounter (HOSPITAL_COMMUNITY)
Admission: RE | Disposition: A | Discharge status: Home / Self Care | Payer: Self-pay | Source: Ambulatory Visit | Attending: Cardiovascular Disease

## 2014-12-29 DIAGNOSIS — I10 Essential (primary) hypertension: Secondary | ICD-10-CM | POA: Diagnosis not present

## 2014-12-29 DIAGNOSIS — Z794 Long term (current) use of insulin: Secondary | ICD-10-CM | POA: Insufficient documentation

## 2014-12-29 DIAGNOSIS — E669 Obesity, unspecified: Secondary | ICD-10-CM | POA: Insufficient documentation

## 2014-12-29 DIAGNOSIS — K219 Gastro-esophageal reflux disease without esophagitis: Secondary | ICD-10-CM | POA: Insufficient documentation

## 2014-12-29 DIAGNOSIS — R9439 Abnormal result of other cardiovascular function study: Secondary | ICD-10-CM | POA: Diagnosis present

## 2014-12-29 DIAGNOSIS — I739 Peripheral vascular disease, unspecified: Secondary | ICD-10-CM | POA: Diagnosis not present

## 2014-12-29 DIAGNOSIS — Z955 Presence of coronary angioplasty implant and graft: Secondary | ICD-10-CM | POA: Insufficient documentation

## 2014-12-29 DIAGNOSIS — B009 Herpesviral infection, unspecified: Secondary | ICD-10-CM | POA: Insufficient documentation

## 2014-12-29 DIAGNOSIS — I25119 Atherosclerotic heart disease of native coronary artery with unspecified angina pectoris: Secondary | ICD-10-CM | POA: Diagnosis not present

## 2014-12-29 DIAGNOSIS — E785 Hyperlipidemia, unspecified: Secondary | ICD-10-CM | POA: Diagnosis not present

## 2014-12-29 DIAGNOSIS — E1159 Type 2 diabetes mellitus with other circulatory complications: Secondary | ICD-10-CM | POA: Insufficient documentation

## 2014-12-29 DIAGNOSIS — Z87891 Personal history of nicotine dependence: Secondary | ICD-10-CM | POA: Insufficient documentation

## 2014-12-29 DIAGNOSIS — F329 Major depressive disorder, single episode, unspecified: Secondary | ICD-10-CM | POA: Insufficient documentation

## 2014-12-29 DIAGNOSIS — I2510 Atherosclerotic heart disease of native coronary artery without angina pectoris: Secondary | ICD-10-CM | POA: Diagnosis not present

## 2014-12-29 DIAGNOSIS — Z7982 Long term (current) use of aspirin: Secondary | ICD-10-CM | POA: Diagnosis not present

## 2014-12-29 DIAGNOSIS — Z8673 Personal history of transient ischemic attack (TIA), and cerebral infarction without residual deficits: Secondary | ICD-10-CM | POA: Insufficient documentation

## 2014-12-29 DIAGNOSIS — Z6833 Body mass index (BMI) 33.0-33.9, adult: Secondary | ICD-10-CM | POA: Diagnosis not present

## 2014-12-29 DIAGNOSIS — K317 Polyp of stomach and duodenum: Secondary | ICD-10-CM | POA: Diagnosis not present

## 2014-12-29 DIAGNOSIS — D649 Anemia, unspecified: Secondary | ICD-10-CM | POA: Diagnosis not present

## 2014-12-29 HISTORY — DX: Abnormal result of other cardiovascular function study: R94.39

## 2014-12-29 HISTORY — PX: CARDIAC CATHETERIZATION: SHX172

## 2014-12-29 LAB — GLUCOSE, CAPILLARY
GLUCOSE-CAPILLARY: 126 mg/dL — AB (ref 65–99)
Glucose-Capillary: 163 mg/dL — ABNORMAL HIGH (ref 65–99)

## 2014-12-29 SURGERY — LEFT HEART CATH AND CORONARY ANGIOGRAPHY

## 2014-12-29 MED ORDER — SODIUM CHLORIDE 0.9 % IV SOLN: 250.0000 mL | INTRAVENOUS | Status: DC | PRN

## 2014-12-29 MED ORDER — LIDOCAINE HCL (PF) 1 % IJ SOLN
INTRAMUSCULAR | Status: DC | PRN
  Administered 2014-12-29: 3 mL

## 2014-12-29 MED ORDER — SODIUM CHLORIDE 0.9 % IV SOLN: INTRAVENOUS | Status: AC

## 2014-12-29 MED ORDER — SODIUM CHLORIDE 0.9 % IJ SOLN: 3.0000 mL | Freq: Two times a day (BID) | INTRAMUSCULAR | Status: DC

## 2014-12-29 MED ORDER — NITROGLYCERIN 1 MG/10 ML FOR IR/CATH LAB
INTRA_ARTERIAL | Status: AC
Start: 2014-12-29 — End: 2014-12-29
  Filled 2014-12-29: qty 10

## 2014-12-29 MED ORDER — HEPARIN (PORCINE) IN NACL 2-0.9 UNIT/ML-% IJ SOLN
INTRAMUSCULAR | Status: AC
  Filled 2014-12-29: qty 1500

## 2014-12-29 MED ORDER — FENTANYL CITRATE (PF) 100 MCG/2ML IJ SOLN
INTRAMUSCULAR | Status: AC
  Filled 2014-12-29: qty 2

## 2014-12-29 MED ORDER — MIDAZOLAM HCL 2 MG/2ML IJ SOLN
INTRAMUSCULAR | Status: DC | PRN
  Administered 2014-12-29: 2 mg via INTRAVENOUS

## 2014-12-29 MED ORDER — ONDANSETRON HCL 4 MG/2ML IJ SOLN: 4.0000 mg | Freq: Four times a day (QID) | INTRAMUSCULAR | Status: DC | PRN

## 2014-12-29 MED ORDER — HEPARIN SODIUM (PORCINE) 1000 UNIT/ML IJ SOLN
INTRAMUSCULAR | Status: DC | PRN
  Administered 2014-12-29: 5000 [IU] via INTRAVENOUS

## 2014-12-29 MED ORDER — IOHEXOL 350 MG/ML SOLN
INTRAVENOUS | Status: DC | PRN
  Administered 2014-12-29: 70 mL via INTRA_ARTERIAL

## 2014-12-29 MED ORDER — FENTANYL CITRATE (PF) 100 MCG/2ML IJ SOLN
INTRAMUSCULAR | Status: DC | PRN
  Administered 2014-12-29: 25 ug via INTRAVENOUS

## 2014-12-29 MED ORDER — LABETALOL HCL 5 MG/ML IV SOLN: 10.0000 mg | INTRAVENOUS | Status: DC | PRN

## 2014-12-29 MED ORDER — MIDAZOLAM HCL 2 MG/2ML IJ SOLN
INTRAMUSCULAR | Status: AC
  Filled 2014-12-29: qty 2

## 2014-12-29 MED ORDER — SODIUM CHLORIDE 0.9 % WEIGHT BASED INFUSION
3.0000 mL/kg/h | INTRAVENOUS | Status: AC
  Administered 2014-12-29: 3 mL/kg/h via INTRAVENOUS

## 2014-12-29 MED ORDER — LIDOCAINE HCL (PF) 1 % IJ SOLN
INTRAMUSCULAR | Status: AC
Start: 2014-12-29 — End: 2014-12-29
  Filled 2014-12-29: qty 30

## 2014-12-29 MED ORDER — SODIUM CHLORIDE 0.9 % IJ SOLN: 3.0000 mL | INTRAMUSCULAR | Status: DC | PRN

## 2014-12-29 MED ORDER — ASPIRIN 81 MG PO CHEW: 81.0000 mg | CHEWABLE_TABLET | ORAL | Status: AC

## 2014-12-29 MED ORDER — VERAPAMIL HCL 2.5 MG/ML IV SOLN
INTRAVENOUS | Status: AC
  Filled 2014-12-29: qty 2

## 2014-12-29 MED ORDER — SODIUM CHLORIDE 0.9 % WEIGHT BASED INFUSION: 1.0000 mL/kg/h | INTRAVENOUS | Status: DC

## 2014-12-29 MED ORDER — HEPARIN SODIUM (PORCINE) 1000 UNIT/ML IJ SOLN
INTRAMUSCULAR | Status: AC
  Filled 2014-12-29: qty 1

## 2014-12-29 MED ORDER — ACETAMINOPHEN 325 MG PO TABS: 650.0000 mg | ORAL_TABLET | ORAL | Status: DC | PRN

## 2014-12-29 MED ORDER — HYDRALAZINE HCL 20 MG/ML IJ SOLN: 10.0000 mg | Freq: Four times a day (QID) | INTRAMUSCULAR | Status: DC | PRN

## 2014-12-29 SURGICAL SUPPLY — 14 items
CATH INFINITI 5 FR JL3.5 (CATHETERS) ×3 IMPLANT
CATH INFINITI 5FR ANG PIGTAIL (CATHETERS) IMPLANT
CATH INFINITI 5FR MULTPACK ANG (CATHETERS) IMPLANT
CATH INFINITI JR4 5F (CATHETERS) ×3 IMPLANT
DEVICE RAD COMP TR BAND LRG (VASCULAR PRODUCTS) ×3 IMPLANT
GLIDESHEATH SLEND SS 6F .021 (SHEATH) ×3 IMPLANT
KIT HEART LEFT (KITS) ×3 IMPLANT
PACK CARDIAC CATHETERIZATION (CUSTOM PROCEDURE TRAY) ×3 IMPLANT
SHEATH PINNACLE 5F 10CM (SHEATH) IMPLANT
SYR MEDRAD MARK V 150ML (SYRINGE) ×3 IMPLANT
TRANSDUCER W/STOPCOCK (MISCELLANEOUS) ×3 IMPLANT
TUBING CIL FLEX 10 FLL-RA (TUBING) ×3 IMPLANT
WIRE EMERALD 3MM-J .035X150CM (WIRE) IMPLANT
WIRE SAFE-T 1.5MM-J .035X260CM (WIRE) ×3 IMPLANT

## 2014-12-29 NOTE — Interval H&P Note (Signed)
History and Physical Interval Note:  12/29/2014 8:54 AM  [name]  has presented today for surgery, with the diagnosis of cp/abnormal stress test  The various methods of treatment have been discussed with the patient and family. After consideration of risks, benefits and other options for treatment, the patient has consented to  Procedure(s): Left Heart Cath and Coronary Angiography (N/A) as a surgical intervention .  [name] history has been reviewed, patient examined, no change in status, stable for surgery.  I have reviewed the patient's chart and labs.  Questions were answered to the patient's satisfaction.    Cath Lab Visit (complete for each Cath Lab visit)  Clinical Evaluation Leading to the Procedure:   ACS: No.  Non-ACS:    Anginal Classification: CCS III  Anti-ischemic medical therapy: Minimal Therapy (1 class of medications)  Non-Invasive Test Results: Low-risk stress test findings: cardiac mortality <1%/year  Prior CABG: No previous CABG       [name]

## 2014-12-29 NOTE — H&P (View-Only) (Signed)
Cardiology Office Note   Date:  12/28/2014   ID:  OGECHI [name], DOB 11-08-1945, MRN [id number]  PCP:  Laurey Morale, MD  Cardiologist:  Dr. Burt Knack Chief Complaint:chest pain    History of Present Illness: [name] is a 69 y.o. female who presents for follow-up of stress Myoview. Patient has history of DES to the RCA in June 2010. She had atypical symptoms then with tongue numbness. She has residual LAD and diagonal disease. She was recently in the emergency room with chest pain. She had an elevated d-dimer but negative CTA. Troponins were negative. Her Toprol was changed to Coreg. She saw [name] in follow-up still complaining of some typical and atypical symptoms and he ordered a stress Myoview. This showed a possible small defect in the inferolateral wall. Ischemia cannot be completely ruled out but there is significant diaphragmatic and soft tissue attenuation and are a W images. Subtle low risk study overall systolic function is normal EF 55-65%. Dr. Burt Knack reviewed this am recommends cardiac catheterization.  Since then her PCP placed her on diltiazem as well as Coreg. She became fatigued, dizzy and developed lower extremity edema and bradycardia. Dr. Harrington Challenger stopped her diltiazem and start methyldopa.  Patient also has history of hypertension, and history of TIA.She just had eye surgery and is on sterile eyedrops.  Patient comes in today complaining of a low soreness under her left breast but overall feeling fine. She denies any chest tightness, pressure, dyspnea, dyspnea on exertion, dizziness or presyncope. She is just recovering from her recent eye surgery.    Past Medical History  Diagnosis Date  . HTN (hypertension)   . Hyperlipidemia   . TIA (transient ischemic attack)     also Hx of it.   [name] GERD (gastroesophageal reflux disease)   . Headache(784.0)   . Anemia, unspecified   . Edema   . Depressive disorder, not elsewhere classified   . Low back pain   .  Obesity   . Duodenal nodule   . Hemorrhoids   . Arthritis   . Glaucoma     narrow angle, sees Dr. Herbert Deaner   . Type II or unspecified type diabetes mellitus without mention of complication, not stated as uncontrolled     sees Dr. Carrolyn Meiers   . Gastric polyps     COLON  . Allergy   . CAD (coronary artery disease)     sees. Dr. Burt Knack. s/p cath with PCI of RCA (xience des) June 2010. Pt also with LAD and D2 dzs-NL FFR in both areas med Rx  . Peripheral vascular disease   . Stroke     TIA  "many yrs ago"    . HSV (herpes simplex virus) anogenital infection 05/2014  . Cataract     sees Dr. Herbert Deaner     Past Surgical History  Procedure Laterality Date  . Bilateral carpal tunnel release    . Colonoscopy  05-29-11    per Dr. Henrene Pastor, benign polyps, repeat in 5 yrs    . Esophagogastroduodenoscopy  02-08-06    per Dr. Henrene Pastor, gastritis   . Glaucoma surgery      with laser, per Dr. Henrene Pastor   . Carotid endarterectomy  march 2012    per Dr. Morton Amy  . Abdominal hysterectomy  age 28    TAH and USO  Leiomyomata  . Back surgery  2008    lumb fusion  . Cholecystectomy    . Dilation and curettage of uterus    .  Breast biopsy Left 01/26/2014    Procedure: EXCISION LEFT BREAST MASS;  Surgeon: Adin Hector, MD;  Location: [facility];  Service: General;  Laterality: Left;  [name] Eye surgery    . Cardiac catheterization    . Carpal tunnel release Left 05/14/2014    Procedure: Left Carpal tunnel release;  Surgeon: Ashok Pall, MD;  Location: [facility];  Service: Neurosurgery;  Laterality: Left;  Left Carpal tunnel release  . Ulnar nerve transposition Left 05/14/2014    Procedure: Left Ulnar nerve decompression;  Surgeon: Ashok Pall, MD;  Location: [facility];  Service: Neurosurgery;  Laterality: Left;  Left Ulnar nerve decompression  . Anterior cervical corpectomy N/A 06/11/2014    Procedure: ANTERIOR CERVICAL CORPECTOMY CERVICAL SIX; WITH CERVICAL FIVE TO CERVICAL SEVEN  ARTHRODESIS;  Surgeon: Ashok Pall, MD;  Location: [facility];  Service: Neurosurgery;  Laterality: N/A;     Current Outpatient Prescriptions  Medication Sig Dispense Refill  . anastrozole (ARIMIDEX) 1 MG tablet Take 1 tablet (1 mg total) by mouth daily. 30 tablet 5  . aspirin 81 MG tablet Take 81 mg by mouth daily.     . carvedilol (COREG) 25 MG tablet Take 25 mg by mouth 2 (two) times daily with a meal.    . diclofenac sodium (VOLTAREN) 1 % GEL Apply 2 g topically 4 (four) times daily as needed (for pain).     . dorzolamide (TRUSOPT) 2 % ophthalmic solution Place 1 drop into the left eye 3 (three) times daily.    . DUREZOL 0.05 % EMUL Place 1 drop into the left eye 3 (three) times daily.    . ergocalciferol (VITAMIN D2) 50000 UNITS capsule Take 50,000 Units by mouth once a week. On Saturday.    . furosemide (LASIX) 40 MG tablet Take 40 mg by mouth daily as needed for fluid.     [name] glucose blood test strip 1 each by Other route See admin instructions. Check blood sugar once daily.    . hydrALAZINE (APRESOLINE) 100 MG tablet Take 100 mg by mouth 3 (three) times daily.    [name] HYDROcodone-acetaminophen (NORCO) 5-325 MG per tablet Take 1-2 tablets by mouth every 6 (six) hours as needed for moderate pain or severe pain. 30 tablet 0  . insulin aspart (NOVOLOG) 100 UNIT/ML injection Inject 10 Units into the skin 3 (three) times daily with meals.     . insulin glargine (LANTUS) 100 UNIT/ML injection Inject 30 Units into the skin daily.     . irbesartan (AVAPRO) 300 MG tablet Take 300 mg by mouth daily.    . isosorbide mononitrate (IMDUR) 60 MG 24 hr tablet Take 60 mg by mouth daily.    [name] latanoprost (XALATAN) 0.005 % ophthalmic solution Place 1 drop into both eyes daily.     . meloxicam (MOBIC) 15 MG tablet Take 15 mg by mouth daily.     . methyldopa (ALDOMET) 250 MG tablet Take 1 tablet (250 mg total) by mouth 2 (two) times daily. 60 tablet 1  . metoprolol succinate (TOPROL-XL) 100 MG 24 hr tablet  Take 100 mg by mouth daily.    . nitroGLYCERIN (NITROSTAT) 0.4 MG SL tablet Place 1 tablet (0.4 mg total) under the tongue every 5 (five) minutes as needed for chest pain. 25 tablet 1  . pantoprazole (PROTONIX) 40 MG tablet Take 1 tablet (40 mg total) by mouth daily. 90 tablet 3  . rosuvastatin (CRESTOR) 40 MG tablet Take 40 mg by mouth  daily.    . spironolactone (ALDACTONE) 25 MG tablet Take 25 mg by mouth daily.    . tamoxifen (NOLVADEX) 20 MG tablet Take 1 tablet (20 mg total) by mouth daily. 30 tablet 2  . traMADol (ULTRAM) 50 MG tablet Take 50 mg by mouth as needed.     No current facility-administered medications for this visit.    Allergies:   Review of patient's allergies indicates no known allergies.    Social History:  The patient  reports that she quit smoking about 47 years ago. She has never used smokeless tobacco. She reports that she does not drink alcohol or use illicit drugs.   Family History:  [name]    family history includes Cancer in her brother and father; Diabetes in her brother; Heart disease in her brother; Hyperlipidemia in her sister; Hypertension in her brother, mother, and sister; Varicose Veins in her brother. There is no history of Coronary artery disease, Colon cancer, Esophageal cancer, or Stomach cancer.    ROS:  Please see the history of present illness.   Otherwise, review of systems are positive for occasional leg swelling, depression, chronic back pain, occasional dizziness.   All other systems are reviewed and negative.    PHYSICAL EXAM: VS:  BP 138/54 mmHg  Pulse 63  Ht 5' 4.5" (1.638 m)  Wt 200 lb 1.9 oz (90.774 kg)  BMI 33.83 kg/m2 , BMI Body mass index is 33.83 kg/(m^2). GEN: Obese, in no acute distress Neck: no JVD, HJR, carotid bruits, or masses Cardiac:  RRR; 2/6 systolic murmur at the left sternal border, no gallop, rubs, thrill or heave,  Respiratory:  clear to auscultation bilaterally, normal work of breathing GI: soft,  nontender, nondistended, + BS MS: no deformity or atrophy Extremities: without cyanosis, clubbing, edema, good distal pulses bilaterally.  Skin: warm and dry, no rash Neuro:  Strength and sensation are intact    EKG:  EKG is ordered today. The ekg ordered today demonstrates normal sinus rhythm with PVC no acute change   Recent Labs: 01/28/2014: TSH 0.97 11/23/2014: ALT 17; BUN 15.6; Creatinine 0.9; HGB 12.2; Platelets 184; Potassium 3.9; Sodium 147*    Lipid Panel    Component Value Date/Time   CHOL 111 01/28/2014 1105   TRIG 174.0* 01/28/2014 1105   HDL 46.90 01/28/2014 1105   CHOLHDL 2 01/28/2014 1105   VLDL 34.8 01/28/2014 1105   LDLCALC 29 01/28/2014 1105      Wt Readings from Last 3 Encounters:  12/28/14 200 lb 1.9 oz (90.774 kg)  12/28/14 200 lb (90.719 kg)  12/16/14 197 lb (89.359 kg)      Other studies Reviewed: Additional studies/ records that were reviewed today include and review of the records demonstrates:   Lexi scan Myoview 6/13/16Stress Perfusion There is a defect present in the basal inferolateral, mid inferolateral and apical lateral location.   Perfusion Summary Defect 1:  There is a small defect of mild severity present in the basal inferolateral, mid inferolateral and apical lateral location. The defect is reversible.   Study Impression Myocardial perfusion is abnormal. This a technically difficult study to interpret. The appears to be some motion artifact on the stress images and overall counts appear lower on the stress images compared to the rest images. There is a possible small defect in the inferolateral wall. Ischemia cannot be completely ruled out but there is significant diaphragmatic and soft tissue attenuation on RAW images noted.  This is a low risk study. Overall left  ventricular systolic function was normal. LV cavity size is normal. The left ventricular ejection fraction is normal (55-65%). There are no images available for  comparison.  From: ACCF/SCAI/STS/AATS/AHA/ASNC/HFSA/SCCT 2012 Appropriate Use Criteria for Coronary Revascularization Focused Update  Cardiac cath 2010 DESCRIPTION OF PROCEDURE:  The risks and indications were explained,  consent was signed and placed on the chart.  A 5-French arterial sheath  was placed in the right femoral artery using a modified Seldinger  technique.  Standard catheters including a JL-4, JR-4, and angled  pigtail were used, all catheter exchanges made over a wire, there were  no apparent complications.    1. Central aortic pressure 126/65 with a mean of 88.  LV 139/11 with      an EDP of 18.  There was no aortic stenosis.  2. The left main was normal.  3. The LAD was a long vessel coursing to the apex.  It gave off 2      diagonals.  In the proximal to mid section, there was a long 50%      tubular stenosis.  This spanned both diagonals.  In the midsection,      there was a 30% stenosis.  In the second diagonal, which was a      large vessel, there was a 60% ostial lesion followed by an 80%      lesion in the midportion.  4. The left circumflex was a moderate-sized vessel, gave off a large      OM-1 and a small OM-2.  There was a 30% lesion in the mid AV groove      circ.  5. The right coronary artery was a large dominant vessel, gave off an      RV branch, a large PDA, and a posterolateral.  There was a focal      95% lesion in the mid section of the RCA just after the takeoff of      the RV branch.    Left ventriculogram done in the RAO projection showed an EF of 65 to  70%.  There was no regional wall motion abnormalities.    ASSESSMENT:  1. Two-vessel coronary artery disease as described above.  2. Normal left ventricular (LV) function.    Plan will be for percutaneous intervention on the mid RCA tomorrow  plus/minus the second diagonal.  We will stop her Aggrenox and load her  on Plavix.     ASSESSMENT AND PLAN: Chest pain with moderate risk of  acute coronary syndrome Patient has been to the emergency room with chest pain and has had taped typical symptoms but also had a typical symptoms when she had her stent in 2010. Lexi scan Myoview shows a small defect in mild severity present in the basal inferior and mid inferolateral and apical lateral location the defect is reversible. It's felt to be a low risk study overall ejection fraction 55-65%. Dr. Burt Knack reviewed this and recommends heart catheterization. I explained the risks including, MI, CVA and benefits of this to the patient and she is agreeable to proceed. She will be scheduled for tomorrow morning.  Essential hypertension Blood pressure better controlled  Type 2 diabetes mellitus with circulatory disorder We'll have patient take half the dose of her Lantus insulin and hold her NovoLog the day of admission     Signed, Ermalinda Barrios, PA-C  12/28/2014 1:47 PM    [facility] [address], [place], [place]  [address] Phone: [phone];  Fax: [phone]

## 2014-12-29 NOTE — Discharge Instructions (Signed)
Radial Site Care °Refer to this sheet in the next few weeks. These instructions provide you with information on caring for yourself after your procedure. Your caregiver may also give you more specific instructions. Your treatment has been planned according to current medical practices, but problems sometimes occur. Call your caregiver if you have any problems or questions after your procedure. °HOME CARE INSTRUCTIONS °· You may shower the day after the procedure. Remove the bandage (dressing) and gently wash the site with plain soap and water. Gently pat the site dry. °· Do not apply powder or lotion to the site. °· Do not submerge the affected site in water for 3 to 5 days. °· Inspect the site at least twice daily. °· Do not flex or bend the affected arm for 24 hours. °· No lifting over 5 pounds (2.3 kg) for 5 days after your procedure. °· Do not drive home if you are discharged the same day of the procedure. Have someone else drive you. °· You may drive 24 hours after the procedure unless otherwise instructed by your caregiver. °· Do not operate machinery or power tools for 24 hours. °· A responsible adult should be with you for the first 24 hours after you arrive home. °What to expect: °· Any bruising will usually fade within 1 to 2 weeks. °· Blood that collects in the tissue (hematoma) may be painful to the touch. It should usually decrease in size and tenderness within 1 to 2 weeks. °SEEK IMMEDIATE MEDICAL CARE IF: °· You have unusual pain at the radial site. °· You have redness, warmth, swelling, or pain at the radial site. °· You have drainage (other than a small amount of blood on the dressing). °· You have chills. °· You have a fever or persistent symptoms for more than 72 hours. °· You have a fever and your symptoms suddenly get worse. °· Your arm becomes pale, cool, tingly, or numb. °· You have heavy bleeding from the site. Hold pressure on the site. °Document Released: 07/28/2010 Document Revised:  09/17/2011 Document Reviewed: 07/28/2010 °[employer]® Patient Information ©2015 [employer]. This information is not intended to replace advice given to you by your health care provider. Make sure you discuss any questions you have with your health care provider. ° °

## 2014-12-30 ENCOUNTER — Ambulatory Visit (HOSPITAL_BASED_OUTPATIENT_CLINIC_OR_DEPARTMENT_OTHER): Payer: Medicare Other

## 2014-12-30 ENCOUNTER — Ambulatory Visit (INDEPENDENT_AMBULATORY_CARE_PROVIDER_SITE_OTHER): Payer: Medicare Other | Admitting: Gynecology

## 2014-12-30 ENCOUNTER — Encounter: Payer: Self-pay | Admitting: Gynecology

## 2014-12-30 VITALS — BP 126/78 | Ht 65.0 in | Wt 198.0 lb

## 2014-12-30 DIAGNOSIS — I25119 Atherosclerotic heart disease of native coronary artery with unspecified angina pectoris: Secondary | ICD-10-CM | POA: Diagnosis not present

## 2014-12-30 DIAGNOSIS — R011 Cardiac murmur, unspecified: Secondary | ICD-10-CM | POA: Diagnosis not present

## 2014-12-30 DIAGNOSIS — I10 Essential (primary) hypertension: Secondary | ICD-10-CM | POA: Diagnosis not present

## 2014-12-30 DIAGNOSIS — K219 Gastro-esophageal reflux disease without esophagitis: Secondary | ICD-10-CM | POA: Diagnosis not present

## 2014-12-30 DIAGNOSIS — Z01419 Encounter for gynecological examination (general) (routine) without abnormal findings: Secondary | ICD-10-CM

## 2014-12-30 DIAGNOSIS — Z8673 Personal history of transient ischemic attack (TIA), and cerebral infarction without residual deficits: Secondary | ICD-10-CM | POA: Diagnosis not present

## 2014-12-30 DIAGNOSIS — N952 Postmenopausal atrophic vaginitis: Secondary | ICD-10-CM | POA: Diagnosis not present

## 2014-12-30 DIAGNOSIS — E785 Hyperlipidemia, unspecified: Secondary | ICD-10-CM | POA: Diagnosis not present

## 2014-12-30 DIAGNOSIS — Z955 Presence of coronary angioplasty implant and graft: Secondary | ICD-10-CM | POA: Diagnosis not present

## 2014-12-30 MED FILL — Heparin Sodium (Porcine) 2 Unit/ML in Sodium Chloride 0.9%: INTRAMUSCULAR | Qty: 1500 | Status: AC

## 2014-12-30 MED FILL — Nitroglycerin IV Soln 100 MCG/ML in D5W: INTRA_ARTERIAL | Qty: 10 | Status: AC

## 2014-12-30 NOTE — Patient Instructions (Signed)
You may obtain a copy of any labs that were done today by logging onto MyChart as outlined in the instructions provided with your AVS (after visit summary). The office will not call with normal lab results but certainly if there are any significant abnormalities then we will contact you.   Health Maintenance, Female A healthy lifestyle and preventative care can promote health and wellness.  Maintain regular health, dental, and eye exams.  Eat a healthy diet. Foods like vegetables, fruits, whole grains, low-fat dairy products, and lean protein foods contain the nutrients you need without too many calories. Decrease your intake of foods high in solid fats, added sugars, and salt. Get information about a proper diet from your caregiver, if necessary.  Regular physical exercise is one of the most important things you can do for your health. Most adults should get at least 150 minutes of moderate-intensity exercise (any activity that increases your heart rate and causes you to sweat) each week. In addition, most adults need muscle-strengthening exercises on 2 or more days a week.   Maintain a healthy weight. The body mass index (BMI) is a screening tool to identify possible weight problems. It provides an estimate of body fat based on height and weight. Your caregiver can help determine your BMI, and can help you achieve or maintain a healthy weight. For adults 20 years and older:  A BMI below 18.5 is considered underweight.  A BMI of 18.5 to 24.9 is normal.  A BMI of 25 to 29.9 is considered overweight.  A BMI of 30 and above is considered obese.  Maintain normal blood lipids and cholesterol by exercising and minimizing your intake of saturated fat. Eat a balanced diet with plenty of fruits and vegetables. Blood tests for lipids and cholesterol should begin at age 61 and be repeated every 5 years. If your lipid or cholesterol levels are high, you are over 50, or you are a high risk for heart  disease, you may need your cholesterol levels checked more frequently.Ongoing high lipid and cholesterol levels should be treated with medicines if diet and exercise are not effective.  If you smoke, find out from your caregiver how to quit. If you do not use tobacco, do not start.  Lung cancer screening is recommended for adults aged 33 80 years who are at high risk for developing lung cancer because of a history of smoking. Yearly low-dose computed tomography (CT) is recommended for people who have at least a 30-pack-year history of smoking and are a current smoker or have quit within the past 15 years. A pack year of smoking is smoking an average of 1 pack of cigarettes a day for 1 year (for example: 1 pack a day for 30 years or 2 packs a day for 15 years). Yearly screening should continue until the smoker has stopped smoking for at least 15 years. Yearly screening should also be stopped for people who develop a health problem that would prevent them from having lung cancer treatment.  If you are pregnant, do not drink alcohol. If you are breastfeeding, be very cautious about drinking alcohol. If you are not pregnant and choose to drink alcohol, do not exceed 1 drink per day. One drink is considered to be 12 ounces (355 mL) of beer, 5 ounces (148 mL) of wine, or 1.5 ounces (44 mL) of liquor.  Avoid use of street drugs. Do not share needles with anyone. Ask for help if you need support or instructions about stopping  the use of drugs.  High blood pressure causes heart disease and increases the risk of stroke. Blood pressure should be checked at least every 1 to 2 years. Ongoing high blood pressure should be treated with medicines, if weight loss and exercise are not effective.  If you are 59 to 67 years old, ask your caregiver if you should take aspirin to prevent strokes.  Diabetes screening involves taking a blood sample to check your fasting blood sugar level. This should be done once every 3  years, after age 91, if you are within normal weight and without risk factors for diabetes. Testing should be considered at a younger age or be carried out more frequently if you are overweight and have at least 1 risk factor for diabetes.  Breast cancer screening is essential preventative care for women. You should practice "breast self-awareness." This means understanding the normal appearance and feel of your breasts and may include breast self-examination. Any changes detected, no matter how small, should be reported to a caregiver. Women in their 66s and 30s should have a clinical breast exam (CBE) by a caregiver as part of a regular health exam every 1 to 3 years. After age 101, women should have a CBE every year. Starting at age 100, women should consider having a mammogram (breast X-ray) every year. Women who have a family history of breast cancer should talk to their caregiver about genetic screening. Women at a high risk of breast cancer should talk to their caregiver about having an MRI and a mammogram every year.  Breast cancer gene (BRCA)-related cancer risk assessment is recommended for women who have family members with BRCA-related cancers. BRCA-related cancers include breast, ovarian, tubal, and peritoneal cancers. Having family members with these cancers may be associated with an increased risk for harmful changes (mutations) in the breast cancer genes BRCA1 and BRCA2. Results of the assessment will determine the need for genetic counseling and BRCA1 and BRCA2 testing.  The Pap test is a screening test for cervical cancer. Women should have a Pap test starting at age 57. Between ages 25 and 35, Pap tests should be repeated every 2 years. Beginning at age 37, you should have a Pap test every 3 years as long as the past 3 Pap tests have been normal. If you had a hysterectomy for a problem that was not cancer or a condition that could lead to cancer, then you no longer need Pap tests. If you are  between ages 50 and 76, and you have had normal Pap tests going back 10 years, you no longer need Pap tests. If you have had past treatment for cervical cancer or a condition that could lead to cancer, you need Pap tests and screening for cancer for at least 20 years after your treatment. If Pap tests have been discontinued, risk factors (such as a new sexual partner) need to be reassessed to determine if screening should be resumed. Some women have medical problems that increase the chance of getting cervical cancer. In these cases, your caregiver may recommend more frequent screening and Pap tests.  The human papillomavirus (HPV) test is an additional test that may be used for cervical cancer screening. The HPV test looks for the virus that can cause the cell changes on the cervix. The cells collected during the Pap test can be tested for HPV. The HPV test could be used to screen women aged 44 years and older, and should be used in women of any age  who have unclear Pap test results. After the age of 55, women should have HPV testing at the same frequency as a Pap test.  Colorectal cancer can be detected and often prevented. Most routine colorectal cancer screening begins at the age of 44 and continues through age 20. However, your caregiver may recommend screening at an earlier age if you have risk factors for colon cancer. On a yearly basis, your caregiver may provide home test kits to check for hidden blood in the stool. Use of a small camera at the end of a tube, to directly examine the colon (sigmoidoscopy or colonoscopy), can detect the earliest forms of colorectal cancer. Talk to your caregiver about this at age 86, when routine screening begins. Direct examination of the colon should be repeated every 5 to 10 years through age 13, unless early forms of pre-cancerous polyps or small growths are found.  Hepatitis C blood testing is recommended for all people born from [birth year] through 1963 and any  individual with known risks for hepatitis C.  Practice safe sex. Use condoms and avoid high-risk sexual practices to reduce the spread of sexually transmitted infections (STIs). Sexually active women aged 36 and younger should be checked for Chlamydia, which is a common sexually transmitted infection. Older women with new or multiple partners should also be tested for Chlamydia. Testing for other STIs is recommended if you are sexually active and at increased risk.  Osteoporosis is a disease in which the bones lose minerals and strength with aging. This can result in serious bone fractures. The risk of osteoporosis can be identified using a bone density scan. Women ages 20 and over and women at risk for fractures or osteoporosis should discuss screening with their caregivers. Ask your caregiver whether you should be taking a calcium supplement or vitamin D to reduce the rate of osteoporosis.  Menopause can be associated with physical symptoms and risks. Hormone replacement therapy is available to decrease symptoms and risks. You should talk to your caregiver about whether hormone replacement therapy is right for you.  Use sunscreen. Apply sunscreen liberally and repeatedly throughout the day. You should seek shade when your shadow is shorter than you. Protect yourself by wearing long sleeves, pants, a wide-brimmed hat, and sunglasses year round, whenever you are outdoors.  Notify your caregiver of new moles or changes in moles, especially if there is a change in shape or color. Also notify your caregiver if a mole is larger than the size of a pencil eraser.  Stay current with your immunizations. Document Released: 01/08/2011 Document Revised: 10/20/2012 Document Reviewed: 01/08/2011 [facility] Patient Information 2014 [employer].

## 2014-12-30 NOTE — Progress Notes (Signed)
[name] [birth year] [id number]        69 y.o.  G2P1011 for breast and pelvic exam.  Past medical history,surgical history, problem list, medications, allergies, family history and social history were all reviewed and documented as reviewed in the EPIC chart.  ROS:  Performed with pertinent positives and negatives included in the history, assessment and plan.   Additional significant findings :  none   Exam: Kim Counsellor Vitals:   12/30/14 1000  BP: 126/78  Height: 5\' 5"  (1.651 m)  Weight: 198 lb (89.812 kg)   General appearance:  Normal affect, orientation and appearance. Skin: Grossly normal HEENT: Without gross lesions.  No cervical or supraclavicular adenopathy. Thyroid normal.  Lungs:  Clear without wheezing, rales or rhonchi Cardiac: RR, without RMG Abdominal:  Soft, nontender, without masses, guarding, rebound, organomegaly or hernia Breasts:  Examined lying and sitting without masses, retractions, discharge or axillary adenopathy.  Well-healed left biopsy site 12:00 2 fingers off areola, no underlying palpable abnormalities Pelvic:  Ext/BUS/vagina with atrophic changes.  Adnexa  Without masses or tenderness    Anus and perineum  Normal   Rectovaginal  Normal sphincter tone without palpated masses or tenderness.    Assessment/Plan:  69 y.o. G64P1011 female for breast and pelvic exam.   1. Post menopausal/atrophic genital changes. Patient status post TAH USO possible BSO for leiomyoma. Without significant hot flushes, night sweats, vaginal dryness. Continue to monitor report any issues. 2. Had excisional breast biopsy last year which showed atypical lobular hyperplasia. Has been seen in the high risk breast clinic and is going to start tamoxifen chemoprophylaxis in August. Had short trial of arimidex but could not tolerate side effects. We'll continue to follow up with them in reference to this. Mammogram 11/2014. Continue with annual mammography. SBE monthly  reviewed. 3. DEXA 2013 normal. Plan repeat DEXA at age 10. Increased calcium vitamin D reviewed. 4. Colonoscopy 2012. Repeat at their recommended interval. 5. Pap smear 2011. No Pap smear done today.  Status post hysterectomy for benign indications. No history of abnormal Pap smears previously. Over the age of 20. Per current screening guidelines we both agreed to stop screening. 6. Health maintenance. No routine blood work done as this is done at her primary physician's office. Follow up 1 year, sooner as needed.   [name], 10:30 AM 12/30/2014

## 2015-01-04 DIAGNOSIS — M25652 Stiffness of left hip, not elsewhere classified: Secondary | ICD-10-CM | POA: Diagnosis not present

## 2015-01-04 DIAGNOSIS — M25651 Stiffness of right hip, not elsewhere classified: Secondary | ICD-10-CM | POA: Diagnosis not present

## 2015-01-04 DIAGNOSIS — M62451 Contracture of muscle, right thigh: Secondary | ICD-10-CM | POA: Diagnosis not present

## 2015-01-04 DIAGNOSIS — M545 Low back pain: Secondary | ICD-10-CM | POA: Diagnosis not present

## 2015-01-05 DIAGNOSIS — H2511 Age-related nuclear cataract, right eye: Secondary | ICD-10-CM | POA: Diagnosis not present

## 2015-01-05 DIAGNOSIS — H25011 Cortical age-related cataract, right eye: Secondary | ICD-10-CM | POA: Diagnosis not present

## 2015-01-09 NOTE — Progress Notes (Signed)
Cardiology Office Note   Date:  01/11/2015   ID:  [name], DOB 11/07/1945, MRN [id number]  PCP:  Laurey Morale, MD  Cardiologist:  Dr. Sherren Mocha     Chief Complaint  Patient presents with  . Coronary Artery Disease  . Follow-up    post L heart cath     History of Present Illness: [name] is a 69 y.o. female with a hx of CAD, diabetes, HTN, prior TIA, carotid stenosis s/p R CEA. She initially presented in 2010 with unstable angina and underwent stenting of the RCA as well as pressure wire analysis of LAD/diagonal branches. She was recently seen in emergency room with chest pain and elevated d-dimer. CT was negative for pulmonary embolism and cardiac enzymes were negative. After being seen in follow-up, stress Myoview was arranged. This demonstrated possible small defect in the inferolateral wall. Ischemia could not be completely ruled out. Dr. Burt Knack reviewed the study and recommended proceeding with cardiac catheterization. LHC performed by Dr. Burt Knack 12/29/14 demonstrated moderate nonobstructive CAD in the proximal LAD unchanged from study in 2010, patent RCA stent, stable anatomy. Medical therapy was recommended. Echocardiogram 12/30/14 demonstrated EF 60-65% with moderate diastolic dysfunction. She returns for follow-up.  She is doing well.  No chest pain, dyspnea. She had cataract surgery recently.  She had a lot of dizziness with the eye drops she was using.  She had a near syncopal episode last week.  Her eye drops were changed and she has felt well since.  No syncope.  She denies orthopnea, PND.  She has mild stable pedal edema.    Studies/Reports Reviewed Today:  LHC 12/29/14 LM:  Ostial 30 LAD: prox 50, dist 50; D1 60 LCx:  lum irregs RCA:  prox 30, mid stent ok Med Rx  Echo 12/30/14 - EF 60% to 65%. Wall motion was normal; Grade 2 diastolic dysfunction   Myoview 12/20/14 Study Impression:  Myocardial perfusion is abnormal. This a technically difficult  study to interpret. The appears to be some motion artifact on the stress images and overall counts appear lower on the stress images compared to the rest images. There is a possible small defect in the inferolateral wall. Ischemia cannot be completely ruled out but there is significant diaphragmatic and soft tissue attenuation on RAW images noted. This is a low risk study. Overall left ventricular systolic function was normal. LV cavity size is normal. The left ventricular ejection fraction is normal (55-65%). There are no images available for comparison.  Carotid US 03/13/62 R CEA site ok LICA 87%  Renal Art [place] 10/29/11 Normal renal arteries bilaterally  Past Medical History  Diagnosis Date  . HTN (hypertension)   . Hyperlipidemia   . TIA (transient ischemic attack)     also Hx of it.   Marland Kitchen GERD (gastroesophageal reflux disease)   . Headache(784.0)   . Anemia, unspecified   . Edema   . Depressive disorder, not elsewhere classified   . Low back pain   . Obesity   . Duodenal nodule   . Hemorrhoids   . Arthritis   . Glaucoma     narrow angle, sees Dr. Herbert Deaner   . Type II or unspecified type diabetes mellitus without mention of complication, not stated as uncontrolled     sees Dr. Carrolyn Meiers   . Gastric polyps     COLON  . Allergy   . CAD (coronary artery disease)     sees. Dr. Burt Knack. s/p cath with  PCI of RCA (xience des) June 2010. Pt also with LAD and D2 dzs-NL FFR in both areas med Rx  . Peripheral vascular disease   . Stroke     TIA  "many yrs ago"    . HSV (herpes simplex virus) anogenital infection 05/2014  . Cataract     sees Dr. Herbert Deaner   . Abnormal nuclear stress test 12/29/2014    Past Surgical History  Procedure Laterality Date  . Bilateral carpal tunnel release    . Colonoscopy  05-29-11    per Dr. Henrene Pastor, benign polyps, repeat in 5 yrs    . Esophagogastroduodenoscopy  02-08-06    per Dr. Henrene Pastor, gastritis   . Glaucoma surgery      with laser, per Dr. Henrene Pastor   .  Carotid endarterectomy  march 2012    per Dr. Morton Amy  . Abdominal hysterectomy  age 43    TAH and USO  Leiomyomata  . Back surgery  2008    lumb fusion  . Cholecystectomy    . Dilation and curettage of uterus    . Breast biopsy Left 01/26/2014    Procedure: EXCISION LEFT BREAST MASS;  Surgeon: Adin Hector, MD;  Location: [place];  Service: General;  Laterality: Left;  Marland Kitchen Eye surgery    . Cardiac catheterization    . Carpal tunnel release Left 05/14/2014    Procedure: Left Carpal tunnel release;  Surgeon: Ashok Pall, MD;  Location: [place] [facility];  Service: Neurosurgery;  Laterality: Left;  Left Carpal tunnel release  . Ulnar nerve transposition Left 05/14/2014    Procedure: Left Ulnar nerve decompression;  Surgeon: Ashok Pall, MD;  Location: [place] [facility];  Service: Neurosurgery;  Laterality: Left;  Left Ulnar nerve decompression  . Anterior cervical corpectomy N/A 06/11/2014    Procedure: ANTERIOR CERVICAL CORPECTOMY CERVICAL SIX; WITH CERVICAL FIVE TO CERVICAL SEVEN ARTHRODESIS;  Surgeon: Ashok Pall, MD;  Location: [place] [facility];  Service: Neurosurgery;  Laterality: N/A;  . Cardiac catheterization N/A 12/29/2014    Procedure: Left Heart Cath and Coronary Angiography;  Surgeon: Sherren Mocha, MD;  [facility];  Service: Cardiovascular;  Laterality: N/A;     Current Outpatient Prescriptions  Medication Sig Dispense Refill  . anastrozole (ARIMIDEX) 1 MG tablet Take 1 tablet (1 mg total) by mouth daily. 30 tablet 5  . aspirin 81 MG tablet Take 81 mg by mouth daily.     . carvedilol (COREG) 25 MG tablet Take 25 mg by mouth 2 (two) times daily with a meal.    . diclofenac sodium (VOLTAREN) 1 % GEL Apply 2 g topically 4 (four) times daily as needed (for pain).     . dorzolamide (TRUSOPT) 2 % ophthalmic solution Place 1 drop into the left eye 3 (three) times daily.    . DUREZOL 0.05 % EMUL Place 1 drop into the left eye 3 (three) times daily.      . ergocalciferol (VITAMIN D2) 50000 UNITS capsule Take 50,000 Units by mouth once a week. On Saturday.    . furosemide (LASIX) 40 MG tablet Take 40 mg by mouth daily as needed for fluid.     . hydrALAZINE (APRESOLINE) 100 MG tablet Take 100 mg by mouth 3 (three) times daily.    Marland Kitchen HYDROcodone-acetaminophen (NORCO) 5-325 MG per tablet Take 1-2 tablets by mouth every 6 (six) hours as needed for moderate pain or severe pain. 30 tablet 0  . insulin aspart (NOVOLOG) 100 UNIT/ML  injection Inject 10 Units into the skin 3 (three) times daily with meals.     . insulin glargine (LANTUS) 100 UNIT/ML injection Inject 30 Units into the skin daily.     . irbesartan (AVAPRO) 300 MG tablet Take 300 mg by mouth daily.    . isosorbide mononitrate (IMDUR) 60 MG 24 hr tablet Take 60 mg by mouth daily.    Marland Kitchen latanoprost (XALATAN) 0.005 % ophthalmic solution Place 1 drop into both eyes daily.     . meloxicam (MOBIC) 15 MG tablet Take 15 mg by mouth daily as needed for pain.     . methyldopa (ALDOMET) 250 MG tablet Take 1 tablet (250 mg total) by mouth 2 (two) times daily. 60 tablet 1  . metoprolol succinate (TOPROL-XL) 100 MG 24 hr tablet Take 100 mg by mouth daily.    . nitroGLYCERIN (NITROSTAT) 0.4 MG SL tablet Place 1 tablet (0.4 mg total) under the tongue every 5 (five) minutes as needed for chest pain. 25 tablet 1  . pantoprazole (PROTONIX) 40 MG tablet Take 1 tablet (40 mg total) by mouth daily. 90 tablet 3  . rosuvastatin (CRESTOR) 40 MG tablet Take 40 mg by mouth daily.    . tamoxifen (NOLVADEX) 20 MG tablet Take 1 tablet (20 mg total) by mouth daily. 30 tablet 2  . traMADol (ULTRAM) 50 MG tablet Take 50 mg by mouth as needed for moderate pain.      No current facility-administered medications for this visit.    Allergies:   Review of patient's allergies indicates no known allergies.    Social History:  The patient  reports that she quit smoking about 48 years ago. She has never used smokeless tobacco. She  reports that she does not drink alcohol or use illicit drugs.   Family History:  [name] family history includes Cancer in her brother and father; Diabetes in her brother; Heart disease in her brother; Hyperlipidemia in her sister; Hypertension in her brother, mother, and sister; Varicose Veins in her brother. There is no history of Coronary artery disease, Colon cancer, Esophageal cancer, or Stomach cancer.    ROS:   Please see the history of present illness.   Review of Systems  Eyes: Positive for visual disturbance.  Musculoskeletal: Positive for back pain.  Neurological: Positive for dizziness and loss of balance.      PHYSICAL EXAM: VS:  BP 125/50 mmHg  Pulse 67  Ht 5\' 5"  (1.651 m)  Wt 198 lb (89.812 kg)  BMI 32.95 kg/m2    Wt Readings from Last 3 Encounters:  01/11/15 198 lb (89.812 kg)  12/30/14 198 lb (89.812 kg)  12/29/14 200 lb (90.719 kg)     GEN: Well nourished, well developed, in no acute distress HEENT: normal Neck: no JVD,  no masses Cardiac:  Normal S1/S2, RRR; no murmur ,  no rubs or gallops, no edema ; right wrist without hematoma or mass  Respiratory:  clear to auscultation bilaterally, no wheezing, rhonchi or rales. GI: soft, nontender, nondistended, + BS MS: no deformity or atrophy Skin: warm and dry  Neuro:  CNs II-XII intact, Strength and sensation are intact Psych: Normal affect   EKG:  EKG is ordered today.  It demonstrates:   NSR, HR 67, normal axis, PVC, no change from prior tracing   Recent Labs: 12/28/2014: ALT 15; BUN 25*; Creatinine, Ser 1.27*; Hemoglobin 10.5*; Platelets 174.0; Potassium 3.6; Sodium 142; TSH 2.36    Lipid Panel    Component Value  Date/Time   CHOL 153 12/28/2014 1056   TRIG 119.0 12/28/2014 1056   HDL 35.70* 12/28/2014 1056   CHOLHDL 4 12/28/2014 1056   VLDL 23.8 12/28/2014 1056   LDLCALC 94 12/28/2014 1056      ASSESSMENT AND PLAN:  Coronary artery disease involving native coronary artery of native heart  without angina pectoris:  No further chest pain.  Recent LHC with stable anatomy and patent RCA stent.  Continue beta-blocker, ASA, nitrates, statin.    Essential hypertension:  Controlled.  She was apparently changed from Toprol-XL to Carvedilol.  She still has both meds on her list. I will have her check these medicines at home.  She will notify us later today regarding what she is taking.  Dyslipidemia:  Continue statin.   Carotid stenosis, bilateral:  Followed by [facility].  Dizziness:  This seems to be related to the eye drops she was taking.  No further symptoms since stopping this medication.  She will notify us if this should recur.      Medication Changes: Current medicines are reviewed at length with the patient today.  Concerns regarding medicines are as outlined above.  The following changes have been made:   Discontinued Medications   No medications on file   Modified Medications   No medications on file   New Prescriptions   No medications on file     Labs/ tests ordered today include:   Orders Placed This Encounter  Procedures  . EKG 12-Lead     Disposition:   FU with Dr. Sherren Mocha as planned.    Signed, Versie Starks, MHS 01/11/2015 9:58 AM    [facility] [place], [place], [place]  [address] Phone: [phone]; Fax: [phone]

## 2015-01-11 ENCOUNTER — Encounter: Payer: Self-pay | Admitting: Physician Assistant

## 2015-01-11 ENCOUNTER — Ambulatory Visit (INDEPENDENT_AMBULATORY_CARE_PROVIDER_SITE_OTHER): Payer: Medicare Other | Admitting: Physician Assistant

## 2015-01-11 VITALS — BP 125/50 | HR 67 | Ht 65.0 in | Wt 198.0 lb

## 2015-01-11 DIAGNOSIS — I2510 Atherosclerotic heart disease of native coronary artery without angina pectoris: Secondary | ICD-10-CM | POA: Diagnosis not present

## 2015-01-11 DIAGNOSIS — I10 Essential (primary) hypertension: Secondary | ICD-10-CM | POA: Diagnosis not present

## 2015-01-11 DIAGNOSIS — M545 Low back pain: Secondary | ICD-10-CM | POA: Diagnosis not present

## 2015-01-11 DIAGNOSIS — E785 Hyperlipidemia, unspecified: Secondary | ICD-10-CM | POA: Diagnosis not present

## 2015-01-11 DIAGNOSIS — M25651 Stiffness of right hip, not elsewhere classified: Secondary | ICD-10-CM | POA: Diagnosis not present

## 2015-01-11 DIAGNOSIS — I6523 Occlusion and stenosis of bilateral carotid arteries: Secondary | ICD-10-CM | POA: Diagnosis not present

## 2015-01-11 DIAGNOSIS — M62451 Contracture of muscle, right thigh: Secondary | ICD-10-CM | POA: Diagnosis not present

## 2015-01-11 DIAGNOSIS — M25652 Stiffness of left hip, not elsewhere classified: Secondary | ICD-10-CM | POA: Diagnosis not present

## 2015-01-11 NOTE — Patient Instructions (Signed)
Medication Instructions:  WHEN YOU GET HOME TODAY CALL THE OFFICE [phone] SCOTT WEAVER, PAC TO LET us KNOW IF YOU ARE TAKING TOPROL OR COREG  Labwork: NONE  Testing/Procedures: NONE  Follow-Up: KEEP YOUR APPT WITH DR. Burt Knack 01/2015 AS PLANNED  Any Other Special Instructions Will Be Listed Below (If Applicable).

## 2015-01-13 DIAGNOSIS — M62451 Contracture of muscle, right thigh: Secondary | ICD-10-CM | POA: Diagnosis not present

## 2015-01-13 DIAGNOSIS — M25651 Stiffness of right hip, not elsewhere classified: Secondary | ICD-10-CM | POA: Diagnosis not present

## 2015-01-13 DIAGNOSIS — M545 Low back pain: Secondary | ICD-10-CM | POA: Diagnosis not present

## 2015-01-13 DIAGNOSIS — M25652 Stiffness of left hip, not elsewhere classified: Secondary | ICD-10-CM | POA: Diagnosis not present

## 2015-01-17 ENCOUNTER — Other Ambulatory Visit: Payer: Self-pay | Admitting: Cardiovascular Disease

## 2015-01-17 ENCOUNTER — Telehealth: Payer: Self-pay | Admitting: Physician Assistant

## 2015-01-17 NOTE — Telephone Encounter (Signed)
New message      Pt calling in regards to toprol or coreg.  Pt is on coreg but not taking other medication. Please call to advise.

## 2015-01-17 NOTE — Telephone Encounter (Signed)
I cb pt to confirm she is taking Coreg and not Toprol as stated in her call earlier today.  Advised pt I will let Nicki Reaper W. PA know of this and I will change her medication list.

## 2015-01-18 DIAGNOSIS — M5416 Radiculopathy, lumbar region: Secondary | ICD-10-CM | POA: Diagnosis not present

## 2015-01-18 DIAGNOSIS — M4726 Other spondylosis with radiculopathy, lumbar region: Secondary | ICD-10-CM | POA: Diagnosis not present

## 2015-01-18 DIAGNOSIS — Z6833 Body mass index (BMI) 33.0-33.9, adult: Secondary | ICD-10-CM | POA: Diagnosis not present

## 2015-01-18 DIAGNOSIS — M4806 Spinal stenosis, lumbar region: Secondary | ICD-10-CM | POA: Diagnosis not present

## 2015-01-18 DIAGNOSIS — M5136 Other intervertebral disc degeneration, lumbar region: Secondary | ICD-10-CM | POA: Diagnosis not present

## 2015-01-18 DIAGNOSIS — I10 Essential (primary) hypertension: Secondary | ICD-10-CM | POA: Diagnosis not present

## 2015-01-20 DIAGNOSIS — E1159 Type 2 diabetes mellitus with other circulatory complications: Secondary | ICD-10-CM | POA: Diagnosis not present

## 2015-01-20 DIAGNOSIS — I6523 Occlusion and stenosis of bilateral carotid arteries: Secondary | ICD-10-CM | POA: Diagnosis not present

## 2015-01-20 DIAGNOSIS — I639 Cerebral infarction, unspecified: Secondary | ICD-10-CM | POA: Diagnosis not present

## 2015-01-20 DIAGNOSIS — E559 Vitamin D deficiency, unspecified: Secondary | ICD-10-CM | POA: Diagnosis not present

## 2015-01-20 DIAGNOSIS — Z6831 Body mass index (BMI) 31.0-31.9, adult: Secondary | ICD-10-CM | POA: Diagnosis not present

## 2015-01-20 DIAGNOSIS — D649 Anemia, unspecified: Secondary | ICD-10-CM | POA: Diagnosis not present

## 2015-01-20 DIAGNOSIS — N62 Hypertrophy of breast: Secondary | ICD-10-CM | POA: Diagnosis not present

## 2015-01-20 DIAGNOSIS — I10 Essential (primary) hypertension: Secondary | ICD-10-CM | POA: Diagnosis not present

## 2015-01-20 DIAGNOSIS — H409 Unspecified glaucoma: Secondary | ICD-10-CM | POA: Diagnosis not present

## 2015-01-20 DIAGNOSIS — F329 Major depressive disorder, single episode, unspecified: Secondary | ICD-10-CM | POA: Diagnosis not present

## 2015-01-20 DIAGNOSIS — K635 Polyp of colon: Secondary | ICD-10-CM | POA: Diagnosis not present

## 2015-01-20 DIAGNOSIS — E785 Hyperlipidemia, unspecified: Secondary | ICD-10-CM | POA: Diagnosis not present

## 2015-01-27 NOTE — Progress Notes (Signed)
Cardiology Office Note   Date:  01/28/2015   ID:  [name], DOB 09-27-1945, MRN [id number]  PCP:  Laurey Morale, MD  Cardiologist:  Sherren Mocha, MD    Chief Complaint  Patient presents with  . Follow-up    CAD     History of Present Illness: [name] is a 69 y.o. female who presents for follow-up evaluation. She is followed for CAD, malignant HTN, and hyperlipidemia. She recently underwent cardiac cath because of an abnormal stress test and this showed stable coronary anatomy with medical therapy recommended.   She complains of dizziness, especially after taking am medications. This has become a chronic problem. She did experience one episode of near-syncope approximately 4-6 weeks ago. She's taking hydralazine twice daily but usually forgets the lunchtime dose. Seems unclear on a few of her medications. Denies edema, orthopnea, or PND. No recent chest pain.   Past Medical History  Diagnosis Date  . HTN (hypertension)   . Hyperlipidemia   . TIA (transient ischemic attack)     also Hx of it.   Marland Kitchen GERD (gastroesophageal reflux disease)   . Headache(784.0)   . Anemia, unspecified   . Edema   . Depressive disorder, not elsewhere classified   . Low back pain   . Obesity   . Duodenal nodule   . Hemorrhoids   . Arthritis   . Glaucoma     narrow angle, sees Dr. Herbert Deaner   . Type II or unspecified type diabetes mellitus without mention of complication, not stated as uncontrolled     sees Dr. Carrolyn Meiers   . Gastric polyps     COLON  . Allergy   . CAD (coronary artery disease)     sees. Dr. Burt Knack. s/p cath with PCI of RCA (xience des) June 2010. Pt also with LAD and D2 dzs-NL FFR in both areas med Rx  . Peripheral vascular disease   . Stroke     TIA  "many yrs ago"    . HSV (herpes simplex virus) anogenital infection 05/2014  . Cataract     sees Dr. Herbert Deaner   . Abnormal nuclear stress test 12/29/2014    Past Surgical History  Procedure Laterality Date   . Bilateral carpal tunnel release    . Colonoscopy  05-29-11    per Dr. Henrene Pastor, benign polyps, repeat in 5 yrs    . Esophagogastroduodenoscopy  02-08-06    per Dr. Henrene Pastor, gastritis   . Glaucoma surgery      with laser, per Dr. Henrene Pastor   . Carotid endarterectomy  march 2012    per Dr. Morton Amy  . Abdominal hysterectomy  age 33    TAH and USO  Leiomyomata  . Back surgery  2008    lumb fusion  . Cholecystectomy    . Dilation and curettage of uterus    . Breast biopsy Left 01/26/2014    Procedure: EXCISION LEFT BREAST MASS;  Surgeon: Adin Hector, MD;  Location: [facility];  Service: General;  Laterality: Left;  Marland Kitchen Eye surgery    . Cardiac catheterization    . Carpal tunnel release Left 05/14/2014    Procedure: Left Carpal tunnel release;  Surgeon: Ashok Pall, MD;  Location: [facility];  Service: Neurosurgery;  Laterality: Left;  Left Carpal tunnel release  . Ulnar nerve transposition Left 05/14/2014    Procedure: Left Ulnar nerve decompression;  Surgeon: Ashok Pall, MD;  Location: [facility];  Service:  Neurosurgery;  Laterality: Left;  Left Ulnar nerve decompression  . Anterior cervical corpectomy N/A 06/11/2014    Procedure: ANTERIOR CERVICAL CORPECTOMY CERVICAL SIX; WITH CERVICAL FIVE TO CERVICAL SEVEN ARTHRODESIS;  Surgeon: Ashok Pall, MD;  Location: [facility];  Service: Neurosurgery;  Laterality: N/A;  . Cardiac catheterization N/A 12/29/2014    Procedure: Left Heart Cath and Coronary Angiography;  Surgeon: Sherren Mocha, MD;  Location: [facility];  Service: Cardiovascular;  Laterality: N/A;    Current Outpatient Prescriptions  Medication Sig Dispense Refill  . acetaminophen (TYLENOL) 500 MG tablet Take 500 mg by mouth daily.    Marland Kitchen aspirin 81 MG tablet Take 81 mg by mouth daily.     . carvedilol (COREG) 25 MG tablet Take 25 mg by mouth 2 (two) times daily with a meal.    . diclofenac sodium (VOLTAREN) 1 % GEL Apply 2 g topically 4 (four)  times daily as needed (for pain).     Marland Kitchen ergocalciferol (VITAMIN D2) 50000 UNITS capsule Take 50,000 Units by mouth once a week. On Saturday.    . furosemide (LASIX) 40 MG tablet TAKE ONE TABLET BY MOUTH ONCE DAILY 30 tablet 0  . hydrALAZINE (APRESOLINE) 100 MG tablet Take 100 mg by mouth 3 (three) times daily.    . insulin aspart (NOVOLOG) 100 UNIT/ML injection Inject 10 Units into the skin 3 (three) times daily with meals.     . insulin glargine (LANTUS) 100 UNIT/ML injection Inject 30 Units into the skin daily.     . irbesartan (AVAPRO) 300 MG tablet Take 300 mg by mouth daily.    . isosorbide mononitrate (IMDUR) 60 MG 24 hr tablet Take 60 mg by mouth daily.    Marland Kitchen latanoprost (XALATAN) 0.005 % ophthalmic solution Place 1 drop into both eyes daily.     . methyldopa (ALDOMET) 250 MG tablet Take 1 tablet (250 mg total) by mouth 2 (two) times daily. 60 tablet 1  . naproxen sodium (ANAPROX) 220 MG tablet Take 220 mg by mouth daily.    . nitroGLYCERIN (NITROSTAT) 0.4 MG SL tablet Place 1 tablet (0.4 mg total) under the tongue every 5 (five) minutes as needed for chest pain. 25 tablet 1  . pantoprazole (PROTONIX) 40 MG tablet Take 1 tablet (40 mg total) by mouth daily. 90 tablet 3  . rosuvastatin (CRESTOR) 40 MG tablet Take 40 mg by mouth daily.    Marland Kitchen spironolactone (ALDACTONE) 25 MG tablet Take 25 mg by mouth daily.    . tamoxifen (NOLVADEX) 20 MG tablet Take 1 tablet (20 mg total) by mouth daily. 30 tablet 2  . traMADol (ULTRAM) 50 MG tablet Take 50 mg by mouth as needed for moderate pain.      No current facility-administered medications for this visit.    Allergies:   Review of patient's allergies indicates no known allergies.   Social History:  The patient  reports that she quit smoking about 48 years ago. She has never used smokeless tobacco. She reports that she does not drink alcohol or use illicit drugs.   Family History:  [name] family history includes Cancer in her brother and  father; Diabetes in her brother; Heart disease in her brother; Hyperlipidemia in her sister; Hypertension in her brother, mother, and sister; Varicose Veins in her brother. There is no history of Coronary artery disease, Colon cancer, Esophageal cancer, or Stomach cancer.    ROS:  Please see the history of present illness.  Otherwise, review of  systems is positive for leg swelling, depression, back pain, dizziness, balance problems.  All other systems are reviewed and negative.    PHYSICAL EXAM: VS:  BP 130/58 mmHg  Pulse 64  Ht 5\' 5"  (1.651 m)  Wt 199 lb 6.4 oz (90.447 kg)  BMI 33.18 kg/m2  SpO2 95% , BMI Body mass index is 33.18 kg/(m^2). GEN: Well nourished, well developed, in no acute distress HEENT: normal Neck: no JVD, no masses. bilateral carotid bruits Cardiac: RRR without murmur or gallop                Respiratory:  clear to auscultation bilaterally, normal work of breathing GI: soft, nontender, nondistended, + BS MS: no deformity or atrophy Ext: no pretibial edema, pedal pulses 2+= bilaterally Skin: warm and dry, no rash Neuro:  Strength and sensation are intact Psych: euthymic mood, full affect  EKG:  EKG is not ordered today.  Recent Labs: 12/28/2014: ALT 15; BUN 25*; Creatinine, Ser 1.27*; Hemoglobin 10.5*; Platelets 174.0; Potassium 3.6; Sodium 142; TSH 2.36   Lipid Panel     Component Value Date/Time   CHOL 153 12/28/2014 1056   TRIG 119.0 12/28/2014 1056   HDL 35.70* 12/28/2014 1056   CHOLHDL 4 12/28/2014 1056   VLDL 23.8 12/28/2014 1056   LDLCALC 94 12/28/2014 1056      Wt Readings from Last 3 Encounters:  01/28/15 199 lb 6.4 oz (90.447 kg)  01/11/15 198 lb (89.812 kg)  12/30/14 198 lb (89.812 kg)     Cardiac Studies Reviewed: 2D Echo 6.23.2016: Study Conclusions  - Left ventricle: The cavity size was normal. Wall thickness was normal. Systolic function was normal. The estimated ejection fraction was in the range of 60% to 65%. Wall motion  was normal; there were no regional wall motion abnormalities. Features are consistent with a pseudonormal left ventricular filling pattern, with concomitant abnormal relaxation and increased filling pressure (grade 2 diastolic dysfunction).  CARDIAC CATH 6.22.2016: Conclusion    FINAL CONCLUSIONS:  MODERATE NONOBSTRUCTIVE PROXIMAL LAD STENOSIS UNCHANGED FROM 2010 CATH STUDY  CONTINUED PATENCY OF THE RCA STENT  WIDELY PATENT LEFT CIRCUMFLEX  ESSENTIALLY STABLE CORONARY ANATOMY  RECOMMENDATIONS:  MEDICAL THERAPY      ASSESSMENT AND PLAN: 1.  CAD, native vessel: no recurrence of angina. Recent cath findings reviewed and showed stable coronary disease. Continue with medical therapy.  2. HTN, malignant: seems to be having significant antihypertensive side effects. Recommend reduce coreg to 12.5 mg BID. Continue other medications without change. Follow-up PA/NP 2-3 months.  3. Hyperlipidemia: on appropriate Rx. Labs reviewed.   4. Carotid stenosis: followed by vascular surgery.   Current medicines are reviewed with the patient today.  The patient does not have concerns regarding medicines.  Labs/ tests ordered today include:  No orders of the defined types were placed in this encounter.    Disposition:   FU as above  Signed, Sherren Mocha, MD  01/28/2015 10:23 AM    [facility] [address], [place], [place]  [address] Phone: [phone]; Fax: [phone]

## 2015-01-28 ENCOUNTER — Ambulatory Visit (INDEPENDENT_AMBULATORY_CARE_PROVIDER_SITE_OTHER): Payer: Medicare Other | Admitting: Cardiovascular Disease

## 2015-01-28 ENCOUNTER — Encounter: Payer: Self-pay | Admitting: Cardiovascular Disease

## 2015-01-28 VITALS — BP 130/58 | HR 64 | Ht 65.0 in | Wt 199.4 lb

## 2015-01-28 DIAGNOSIS — I6523 Occlusion and stenosis of bilateral carotid arteries: Secondary | ICD-10-CM

## 2015-01-28 DIAGNOSIS — I2510 Atherosclerotic heart disease of native coronary artery without angina pectoris: Secondary | ICD-10-CM

## 2015-01-28 MED ORDER — CARVEDILOL 12.5 MG PO TABS: 12.5000 mg | ORAL_TABLET | Freq: Two times a day (BID) | ORAL | Status: DC

## 2015-01-28 NOTE — Patient Instructions (Signed)
Medication Instructions:  Your physician has recommended you make the following change in your medication:  1. DECREASE Carvedilol to 12.5mg  take one tablet by mouth twice a day  Labwork: No new orders.   Testing/Procedures: No new orders.   Follow-Up: Your physician recommends that you schedule a follow-up appointment in: 2 MONTHS with Richardson Dopp PA-C   Any Other Special Instructions Will Be Listed Below (If Applicable).

## 2015-01-30 ENCOUNTER — Encounter: Payer: Self-pay | Admitting: Cardiovascular Disease

## 2015-02-18 ENCOUNTER — Telehealth: Payer: Self-pay | Admitting: Hematology

## 2015-02-18 NOTE — Telephone Encounter (Signed)
bmdc change - moved 8/17 appointments to 8/25. Spoke with patient she is aware and will call back once she checks her date book if this is not ok.

## 2015-02-19 ENCOUNTER — Other Ambulatory Visit: Payer: Self-pay | Admitting: Cardiovascular Disease

## 2015-02-23 ENCOUNTER — Other Ambulatory Visit: Payer: Medicare Other

## 2015-02-23 ENCOUNTER — Ambulatory Visit: Payer: Medicare Other | Admitting: Hematology

## 2015-03-03 ENCOUNTER — Other Ambulatory Visit (HOSPITAL_BASED_OUTPATIENT_CLINIC_OR_DEPARTMENT_OTHER): Payer: Medicare Other

## 2015-03-03 ENCOUNTER — Telehealth: Payer: Self-pay | Admitting: Hematology

## 2015-03-03 ENCOUNTER — Ambulatory Visit (HOSPITAL_BASED_OUTPATIENT_CLINIC_OR_DEPARTMENT_OTHER): Payer: Medicare Other | Admitting: Hematology

## 2015-03-03 ENCOUNTER — Encounter: Payer: Self-pay | Admitting: Hematology

## 2015-03-03 VITALS — BP 174/63 | HR 70 | Temp 98.4°F | Resp 18 | Ht 65.0 in | Wt 195.1 lb

## 2015-03-03 DIAGNOSIS — N6092 Unspecified benign mammary dysplasia of left breast: Secondary | ICD-10-CM

## 2015-03-03 DIAGNOSIS — D509 Iron deficiency anemia, unspecified: Secondary | ICD-10-CM

## 2015-03-03 DIAGNOSIS — N62 Hypertrophy of breast: Secondary | ICD-10-CM

## 2015-03-03 DIAGNOSIS — Z79810 Long term (current) use of selective estrogen receptor modulators (SERMs): Secondary | ICD-10-CM | POA: Diagnosis not present

## 2015-03-03 DIAGNOSIS — D649 Anemia, unspecified: Secondary | ICD-10-CM

## 2015-03-03 LAB — CBC & DIFF AND RETIC
BASO%: 0.3 % (ref 0.0–2.0)
Basophils Absolute: 0 10*3/uL (ref 0.0–0.1)
EOS%: 1.9 % (ref 0.0–7.0)
Eosinophils Absolute: 0.2 10*3/uL (ref 0.0–0.5)
HCT: 37.2 % (ref 34.8–46.6)
HEMOGLOBIN: 11.9 g/dL (ref 11.6–15.9)
Immature Retic Fract: 4.8 % (ref 1.60–10.00)
LYMPH#: 1.9 10*3/uL (ref 0.9–3.3)
LYMPH%: 24.9 % (ref 14.0–49.7)
MCH: 27.2 pg (ref 25.1–34.0)
MCHC: 32 g/dL (ref 31.5–36.0)
MCV: 85.1 fL (ref 79.5–101.0)
MONO#: 0.5 10*3/uL (ref 0.1–0.9)
MONO%: 6.4 % (ref 0.0–14.0)
NEUT%: 66.5 % (ref 38.4–76.8)
NEUTROS ABS: 5.1 10*3/uL (ref 1.5–6.5)
Platelets: 201 10*3/uL (ref 145–400)
RBC: 4.37 10*6/uL (ref 3.70–5.45)
RDW: 15.5 % — ABNORMAL HIGH (ref 11.2–14.5)
RETIC CT ABS: 53.75 10*3/uL (ref 33.70–90.70)
Retic %: 1.23 % (ref 0.70–2.10)
WBC: 7.7 10*3/uL (ref 3.9–10.3)

## 2015-03-03 LAB — COMPREHENSIVE METABOLIC PANEL (CC13)
ALBUMIN: 3.9 g/dL (ref 3.5–5.0)
ALK PHOS: 74 U/L (ref 40–150)
ALT: 16 U/L (ref 0–55)
AST: 17 U/L (ref 5–34)
Anion Gap: 8 mEq/L (ref 3–11)
BILIRUBIN TOTAL: 0.47 mg/dL (ref 0.20–1.20)
BUN: 18.3 mg/dL (ref 7.0–26.0)
CO2: 27 meq/L (ref 22–29)
Calcium: 9.3 mg/dL (ref 8.4–10.4)
Chloride: 108 mEq/L (ref 98–109)
Creatinine: 0.9 mg/dL (ref 0.6–1.1)
EGFR: 75 mL/min/{1.73_m2} — ABNORMAL LOW (ref >90)
GLUCOSE: 186 mg/dL — AB (ref 70–140)
Potassium: 3.8 mEq/L (ref 3.5–5.1)
SODIUM: 143 meq/L (ref 136–145)
TOTAL PROTEIN: 6.6 g/dL (ref 6.4–8.3)

## 2015-03-03 NOTE — Telephone Encounter (Signed)
Pt confirmed labs/ov per 08/25 POF, gave pt AVS and Calendar... KJ °

## 2015-03-03 NOTE — Progress Notes (Signed)
[facility] FOLLOW UP NOTE    Patient Care Team: Laurey Morale, MD as PCP - Alsen, MD as Consulting Physician (Endocrinology) Sherren Mocha, MD as Consulting Physician (Cardiology) Ashok Pall, MD as Consulting Physician (Neurosurgery) Fanny Skates, MD (Surgeon) Fontaine (Gynecologist)  CHIEF COMPLAINTS:  Follow up Atypical Lobular Hyperplasia [name])  HISTORY OF INITIAL PRESENTING ILLNESS:   [name] 69 y.o. female from [place], [name] is here because of recent diagnosis of Left breast [place] which is a precursor lesion and imparts a higher future risk for developing invasive and non-invasive breast cancers in future. She also have several comorbid conditions like Diabetes, HTN, Chronic back pain, Coronary artery disease (1 stent), TIA and fibromyalgia.  She had history of a right breast biopsy on July 27,2010 consistent with benign stromal calcifications and no malignancy. She noticed a lump in left breast this year in May 2015 as she keeps her extra money in bra and she noticed a change in her breast when she was removing money. She underwent a 3D diagnostic mammogram and an ultrasound on 11/18/2013 read as benign and there was no sonographic evidence of malignancy. Patient saw Dr Dalbert Batman in surgery after being referred by Dr Phineas Real for this palpable lump with negative imaging studies.  She then had a lumpectomy done on 01/26/2014 and the pathology from that showed [place] (Atypical Lobular Hyperplasia). There was no invasive cancer or cancer in situ and margins were negative.Pathology accession no is [id number]. She is now referred to high risk breast clinic to discuss chemoprevention strategies.she is up to date with her pap smear, colonoscopy and bone density.  In terms of breast cancer risk profile:  She menarched at early age of 60 and went to menopause at age 55 (TAH due to fibroids). She had 2 pregnancies resulting in miscarriagesy, no  biological children, 1 adopted boy.  She did not breast-fed.  She received birth control pills for couple of years and also had IUD placed.  She was never exposed to fertility medications but took HRT for few years.  She has + family history of Breast cancer. 2 maternal cousins developed breast cancer (age 18's and 71's) and her maternal aunt had breast cancer also. 1 brother with prostate cancer. 1 sister healthy as a horse. Father had prostate cancer and stomach cancer. Adopted boy is 32 years old. Mother alive at age 79 with early dementia.No ovarian cancer in family.  INTERIM HISTORY: She returns for follow up. She stopped Arimidex in May 2016, due to severe side effects, including hot flashes, dizziness, leg pain. Her symptoms resolved after she stopped the medication. I did call in tamoxifen in June, but she has not started. She is doing well, denies any pain or other symptoms.  MEDICAL HISTORY:  Past Medical History  Diagnosis Date  . HTN (hypertension)   . Hyperlipidemia   . TIA (transient ischemic attack)     also Hx of it.   Marland Kitchen GERD (gastroesophageal reflux disease)   . Headache(784.0)   . Anemia, unspecified   . Edema   . Depressive disorder, not elsewhere classified   . Low back pain   . Obesity   . Duodenal nodule   . Hemorrhoids   . Arthritis   . Glaucoma     narrow angle, sees Dr. Herbert Deaner   . Type II or unspecified type diabetes mellitus without mention of complication, not stated as uncontrolled     sees Dr. Carrolyn Meiers   .  Gastric polyps     COLON  . Allergy   . CAD (coronary artery disease)     sees. Dr. Burt Knack. s/p cath with PCI of RCA (xience des) June 2010. Pt also with LAD and D2 dzs-NL FFR in both areas med Rx  . Peripheral vascular disease   . Stroke     TIA  "many yrs ago"    . HSV (herpes simplex virus) anogenital infection 05/2014  . Cataract     sees Dr. Herbert Deaner   . Abnormal nuclear stress test 12/29/2014    SURGICAL HISTORY: Past Surgical History    Procedure Laterality Date  . Bilateral carpal tunnel release    . Colonoscopy  05-29-11    per Dr. Henrene Pastor, benign polyps, repeat in 5 yrs    . Esophagogastroduodenoscopy  02-08-06    per Dr. Henrene Pastor, gastritis   . Glaucoma surgery      with laser, per Dr. Henrene Pastor   . Carotid endarterectomy  march 2012    per Dr. Morton Amy  . Abdominal hysterectomy  age 89    TAH and USO  Leiomyomata  . Back surgery  2008    lumb fusion  . Cholecystectomy    . Dilation and curettage of uterus    . Breast biopsy Left 01/26/2014    Procedure: EXCISION LEFT BREAST MASS;  Surgeon: Adin Hector, MD;  Location: [facility];  Service: General;  Laterality: Left;  Marland Kitchen Eye surgery    . Cardiac catheterization    . Carpal tunnel release Left 05/14/2014    Procedure: Left Carpal tunnel release;  Surgeon: Ashok Pall, MD;  Location: [facility];  Service: Neurosurgery;  Laterality: Left;  Left Carpal tunnel release  . Ulnar nerve transposition Left 05/14/2014    Procedure: Left Ulnar nerve decompression;  Surgeon: Ashok Pall, MD;  Location: [facility];  Service: Neurosurgery;  Laterality: Left;  Left Ulnar nerve decompression  . Anterior cervical corpectomy N/A 06/11/2014    Procedure: ANTERIOR CERVICAL CORPECTOMY CERVICAL SIX; WITH CERVICAL FIVE TO CERVICAL SEVEN ARTHRODESIS;  Surgeon: Ashok Pall, MD;  Location: [facility];  Service: Neurosurgery;  Laterality: N/A;  . Cardiac catheterization N/A 12/29/2014    Procedure: Left Heart Cath and Coronary Angiography;  Surgeon: Sherren Mocha, MD;  Location: [facility];  Service: Cardiovascular;  Laterality: N/A;    SOCIAL HISTORY: Social History   Social History  . Marital Status: Married    Spouse Name: N/A  . Number of Children: N/A  . Years of Education: N/A   Occupational History  . Not on file.   Social History Main Topics  . Smoking status: Former Smoker    Quit date: 01/22/1967  . Smokeless tobacco: Never Used      Comment: non smoker  . Alcohol Use: No  . Drug Use: No  . Sexual Activity: No     Comment: HYST-1st intercourse 26 yo-5 partners   Other Topics Concern  . Not on file   Social History Narrative    FAMILY HISTORY: Family History  Problem Relation Age of Onset  . Coronary artery disease Neg Hx     premature CAD  . Colon cancer Neg Hx   . Esophageal cancer Neg Hx   . Stomach cancer Neg Hx   . Hypertension Mother   . Hypertension Sister   . Hyperlipidemia Sister   . Hypertension Brother   . Cancer Brother     PROSTATE/LUNG  . Diabetes Brother   .  Heart disease Brother     Before age 29 - Bypass  . Varicose Veins Brother   . Cancer Father     Prostate and pancreatic     ALLERGIES:  has No Known Allergies.  MEDICATIONS:  Current Outpatient Prescriptions  Medication Sig Dispense Refill  . acetaminophen (TYLENOL) 500 MG tablet Take 500 mg by mouth daily.    Marland Kitchen aspirin 81 MG tablet Take 81 mg by mouth daily.     . carvedilol (COREG) 12.5 MG tablet Take 1 tablet (12.5 mg total) by mouth 2 (two) times daily with a meal. 60 tablet 6  . CRESTOR 40 MG tablet TAKE ONE TABLET BY MOUTH ONCE DAILY 30 tablet 1  . diclofenac sodium (VOLTAREN) 1 % GEL Apply 2 g topically 4 (four) times daily as needed (for pain).     Marland Kitchen ergocalciferol (VITAMIN D2) 50000 UNITS capsule Take 50,000 Units by mouth once a week. On Saturday.    . furosemide (LASIX) 40 MG tablet TAKE ONE TABLET BY MOUTH ONCE DAILY 30 tablet 0  . hydrALAZINE (APRESOLINE) 100 MG tablet Take 100 mg by mouth 3 (three) times daily.    . insulin aspart (NOVOLOG) 100 UNIT/ML injection Inject 10 Units into the skin 3 (three) times daily with meals.     . insulin glargine (LANTUS) 100 UNIT/ML injection Inject 30 Units into the skin daily.     . irbesartan (AVAPRO) 300 MG tablet Take 300 mg by mouth daily.    . isosorbide mononitrate (IMDUR) 60 MG 24 hr tablet Take 60 mg by mouth daily.    Marland Kitchen latanoprost (XALATAN) 0.005 % ophthalmic  solution Place 1 drop into both eyes daily.     . methyldopa (ALDOMET) 250 MG tablet Take 1 tablet (250 mg total) by mouth 2 (two) times daily. 60 tablet 1  . nitroGLYCERIN (NITROSTAT) 0.4 MG SL tablet Place 1 tablet (0.4 mg total) under the tongue every 5 (five) minutes as needed for chest pain. 25 tablet 1  . pantoprazole (PROTONIX) 40 MG tablet Take 1 tablet (40 mg total) by mouth daily. 90 tablet 3  . rosuvastatin (CRESTOR) 40 MG tablet Take 40 mg by mouth daily.    Marland Kitchen spironolactone (ALDACTONE) 25 MG tablet Take 25 mg by mouth daily.    . tamoxifen (NOLVADEX) 20 MG tablet Take 1 tablet (20 mg total) by mouth daily. (Patient not taking: Reported on 03/03/2015) 30 tablet 2   No current facility-administered medications for this visit.    ROS   PHYSICAL EXAMINATION: ECOG PERFORMANCE STATUS: 0  Filed Vitals:   03/03/15 1017  BP: 174/63  Pulse: 70  Temp: 98.4 F (36.9 C)  Resp: 18   Filed Weights   03/03/15 1017  Weight: 195 lb 1.6 oz (88.497 kg)    Physical Exam  Constitutional: She appears well-developed and well-nourished.  HENT:  Head: Normocephalic and atraumatic.  Nose: Nose normal.  Mouth/Throat: Oropharynx is clear and moist.  Eyes: Conjunctivae and EOM are normal. No scleral icterus.  Neck: Normal range of motion. Neck supple. No JVD present. No tracheal deviation present. No thyromegaly present.  Cardiovascular: Normal rate, regular rhythm and normal heart sounds.  Exam reveals no [name] and no friction rub.   No murmur heard. Pulmonary/Chest: Effort normal and breath sounds normal. No respiratory distress. She has no wheezes. She has no rales. Right breast exhibits no inverted nipple, no mass, no nipple discharge, no skin change and no tenderness. Left breast exhibits no inverted nipple, no  mass, no nipple discharge, no skin change and no tenderness. Breasts are symmetrical. There is no breast swelling.  Abdominal: Soft. Bowel sounds are normal. She exhibits no  distension. There is no tenderness. There is no rebound. No hernia.  Genitourinary: No breast bleeding.  Musculoskeletal: Normal range of motion. She exhibits no edema or tenderness.  Lymphadenopathy:    She has no cervical adenopathy.  Neurological: She is alert. She has normal reflexes. She displays normal reflexes. No cranial nerve deficit. She exhibits normal muscle tone. Coordination normal.  Skin: Skin is warm and dry. No rash noted. No erythema. No pallor.  Psychiatric: She has a normal mood and affect. Her behavior is normal. Judgment and thought content normal.  left breast (+) small lumps below the surgical scar, likely scar tissue.   LABORATORY DATA:  CBC Latest Ref Rng 03/03/2015 12/28/2014 12/28/2014  WBC 3.9 - 10.3 10e3/uL 7.7 7.1 7.4  Hemoglobin 11.6 - 15.9 g/dL 11.9 10.5(L) 11.1(L)  Hematocrit 34.8 - 46.6 % 37.2 32.5(L) 34.4(L)  Platelets 145 - 400 10e3/uL 201 174.0 176.0    CMP Latest Ref Rng 12/28/2014 12/28/2014 11/23/2014  Glucose 70 - 99 mg/dL 141(H) 98 150(H)  BUN 6 - 23 mg/dL 25(H) 23 15.6  Creatinine 0.40 - 1.20 mg/dL 1.27(H) 1.11 0.9  Sodium 135 - 145 mEq/L 142 142 147(H)  Potassium 3.5 - 5.1 mEq/L 3.6 3.6 3.9  Chloride 96 - 112 mEq/L 107 106 -  CO2 19 - 32 mEq/L 27 29 26   Calcium 8.4 - 10.5 mg/dL 9.3 9.5 9.2  Total Protein 6.0 - 8.3 g/dL - 6.9 6.8  Total Bilirubin 0.2 - 1.2 mg/dL - 0.6 0.31  Alkaline Phos 39 - 117 U/L - 63 77  AST 0 - 37 U/L - 19 18  ALT 0 - 35 U/L - 15 17   Iron and TIBC [facility] (Order [id number])      Iron and TIBC [facility]  Status: Finalresult Visible to patient:  MyChart Nextappt: Today at 11:00 AM in Oncology ([facility] B[facility]) Dx:  Anemia, unspecified anemia type           Ref Range 51mo ago  35mo ago  55mo ago     Iron 41 - 142 ug/dL 94 177 (H) 25 (L)    TIBC 236 - 444 ug/dL 350 328 380    UIBC 120 - 384 ug/dL 256 150 355    %SAT 21 - 57 % 27 54 7 (L)        Ferritin (Order [id number])      Ferritin  Status:  Finalresult Visible to patient:  MyChart Nextappt: Today at 11:00 AM in Oncology ([facility] B[facility]) Dx:  Anemia, unspecified anemia type           Ref Range 32mo ago  34mo ago  7mo ago     Ferritin 9 - 269 ng/ml 27 22 11           RADIOGRAPHIC STUDIES: I have personally reviewed the radiological images as listed and agreed with the findings in the report.   ASSESSMENT:   69 years old female with diagnosis of ALH (Atypical Lobular Hyperplasia) seen on a Lumpectomy of left breast. This was done for a palpable lump which was negative on mammogram and ultrasound. She had previously a right breast biopsy which was also negative for malignancy.   1. Left breast ALH  -Dr. Lona Kettle has previously extensively discussed the risks of developing breast cancer and chemoprevention with tamoxifen versus anastrozole. The overall benefit  of chemoprevention is reduce her breast cancer by 50% with absolute benefit 3.5-4% risk reduction. She agreed to take chemoprevention  -Potential side effects of tamoxifen and Arimidex were reviewed with patient again today. She opted to take Arimidex, but tolerated it poorly.  -She is agreeable to try tamoxifen, prescription was called in. Potential side effects, especially risk of thrombosis were discussed with her. She has had hysterectomy, no risk of endometrial cancer. -continue annual screening mammogram, her last one in May 2016 was in normal. -She has strong family history of breast cancer (maternal aunt and her daughter and granddaughter),  and prostate cancer in brother and father, she is interested in genetic counseling. I'll refer her  2. Iron deficient anemia -she has developed moderate microcytic anemia in the past 3-4 month, ferritin 11, low iron and sat, supports iron deficient anemia. Possible related to her recent surgery. -I recommend ferrous sulfate 325 mg 2-3 times a day. Prescription sent to her pharmacy. Constipation reviewed with  patient. -She has been taking oral iron pill for 3 months. Her hemoglobin has normalized, serum iron and ferritin level also normalized, I encouraged her to continue oral iron pill.  Plan -Started tamoxifen  -Dietitian  -Return to clinic in 5-6 weeks  with lab   Truitt Merle 03/03/2015

## 2015-03-09 ENCOUNTER — Encounter: Payer: Self-pay | Admitting: Family

## 2015-03-10 ENCOUNTER — Ambulatory Visit (HOSPITAL_COMMUNITY)
Admission: RE | Admit: 2015-03-10 | Discharge: 2015-03-10 | Disposition: A | Discharge status: Home / Self Care | Payer: Medicare Other | Source: Ambulatory Visit | Attending: Family | Admitting: Family

## 2015-03-10 ENCOUNTER — Ambulatory Visit (INDEPENDENT_AMBULATORY_CARE_PROVIDER_SITE_OTHER): Payer: Medicare Other | Admitting: Family

## 2015-03-10 ENCOUNTER — Encounter: Payer: Self-pay | Admitting: Family

## 2015-03-10 VITALS — BP 141/68 | HR 69 | Temp 98.3°F | Resp 16 | Ht 64.0 in | Wt 195.0 lb

## 2015-03-10 DIAGNOSIS — E785 Hyperlipidemia, unspecified: Secondary | ICD-10-CM | POA: Insufficient documentation

## 2015-03-10 DIAGNOSIS — I6523 Occlusion and stenosis of bilateral carotid arteries: Secondary | ICD-10-CM

## 2015-03-10 DIAGNOSIS — E119 Type 2 diabetes mellitus without complications: Secondary | ICD-10-CM | POA: Diagnosis not present

## 2015-03-10 DIAGNOSIS — Z48812 Encounter for surgical aftercare following surgery on the circulatory system: Secondary | ICD-10-CM

## 2015-03-10 DIAGNOSIS — I10 Essential (primary) hypertension: Secondary | ICD-10-CM | POA: Insufficient documentation

## 2015-03-10 DIAGNOSIS — Z9889 Other specified postprocedural states: Secondary | ICD-10-CM

## 2015-03-10 NOTE — Patient Instructions (Signed)
Stroke Prevention Some medical conditions and behaviors are associated with an increased chance of having a stroke. You may prevent a stroke by making healthy choices and managing medical conditions. HOW CAN I REDUCE MY RISK OF HAVING A STROKE?   Stay physically active. Get at least 30 minutes of activity on most or all days.  Do not smoke. It may also be helpful to avoid exposure to secondhand smoke.  Limit alcohol use. Moderate alcohol use is considered to be:  No more than 2 drinks per day for men.  No more than 1 drink per day for nonpregnant women.  Eat healthy foods. This involves:  Eating 5 or more servings of fruits and vegetables a day.  Making dietary changes that address high blood pressure (hypertension), high cholesterol, diabetes, or obesity.  Manage your cholesterol levels.  Making food choices that are high in fiber and low in saturated fat, trans fat, and cholesterol may control cholesterol levels.  Take any prescribed medicines to control cholesterol as directed by your health care provider.  Manage your diabetes.  Controlling your carbohydrate and sugar intake is recommended to manage diabetes.  Take any prescribed medicines to control diabetes as directed by your health care provider.  Control your hypertension.  Making food choices that are low in salt (sodium), saturated fat, trans fat, and cholesterol is recommended to manage hypertension.  Take any prescribed medicines to control hypertension as directed by your health care provider.  Maintain a healthy weight.  Reducing calorie intake and making food choices that are low in sodium, saturated fat, trans fat, and cholesterol are recommended to manage weight.  Stop drug abuse.  Avoid taking birth control pills.  Talk to your health care provider about the risks of taking birth control pills if you are over 35 years old, smoke, get migraines, or have ever had a blood clot.  Get evaluated for sleep  disorders (sleep apnea).  Talk to your health care provider about getting a sleep evaluation if you snore a lot or have excessive sleepiness.  Take medicines only as directed by your health care provider.  For some people, aspirin or blood thinners (anticoagulants) are helpful in reducing the risk of forming abnormal blood clots that can lead to stroke. If you have the irregular heart rhythm of atrial fibrillation, you should be on a blood thinner unless there is a good reason you cannot take them.  Understand all your medicine instructions.  Make sure that other conditions (such as anemia or atherosclerosis) are addressed. SEEK IMMEDIATE MEDICAL CARE IF:   You have sudden weakness or numbness of the face, arm, or leg, especially on one side of the body.  Your face or eyelid droops to one side.  You have sudden confusion.  You have trouble speaking (aphasia) or understanding.  You have sudden trouble seeing in one or both eyes.  You have sudden trouble walking.  You have dizziness.  You have a loss of balance or coordination.  You have a sudden, severe headache with no known cause.  You have new chest pain or an irregular heartbeat. Any of these symptoms may represent a serious problem that is an emergency. Do not wait to see if the symptoms will go away. Get medical help at once. Call your local emergency services (911 in [place] Do not drive yourself to the hospital. Document Released: 08/02/2004 Document Revised: 11/09/2013 Document Reviewed: 12/26/2012 Exit[employer] Patient Information 2015 [employer]. This information is not intended to replace advice given   to you by your health care provider. Make sure you discuss any questions you have with your health care provider.  

## 2015-03-10 NOTE — Progress Notes (Signed)
Filed Vitals:   03/10/15 1146 03/10/15 1151 03/10/15 1159 03/10/15 1200  BP: 144/65 152/72 139/68 141/68  Pulse: 70 70 70 69  Temp:  98.3 F (36.8 C)    TempSrc:  Oral    Resp:  16    Height:  5\' 4"  (1.626 m)    Weight:  195 lb (88.451 kg)    SpO2:  98%
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
## 2015-03-15 NOTE — Addendum Note (Signed)
Addended by: Dorthula Rue L on: 03/15/2015 04:47 PM   Modules accepted: Orders

## 2015-03-17 ENCOUNTER — Encounter: Payer: Medicare Other | Admitting: Podiatry

## 2015-03-18 ENCOUNTER — Telehealth: Payer: Self-pay

## 2015-03-18 NOTE — Telephone Encounter (Signed)
Pt called stating Dr Burr Medico wanted to be informed how she is doing on the tamoxifen. She is taking it at dinner with her food as the only way she can tolerate it. Her knees are hurting and tight so she can barely lift them. She is asking what can help. She started tamoxifen after OV 03/03/15. Pt denies fever, redness/swelling in knees, or other symptoms.

## 2015-03-18 NOTE — Telephone Encounter (Signed)
I called her back. She has chronic arthritis and back pain. She had knee injection by her orthopedic surgeon before.I recommend her to contact her orthopedic surgeon to try steroids injection. She can also try warm compression, and take NSAIDs. If she does not improve, I recommend her to stop tamoxifen. She voiced good understanding.  [name]  03/18/2015

## 2015-03-21 ENCOUNTER — Other Ambulatory Visit: Payer: Self-pay | Admitting: Neurosurgery

## 2015-03-21 DIAGNOSIS — M4712 Other spondylosis with myelopathy, cervical region: Secondary | ICD-10-CM | POA: Diagnosis not present

## 2015-03-21 DIAGNOSIS — G939 Disorder of brain, unspecified: Secondary | ICD-10-CM | POA: Diagnosis not present

## 2015-03-21 DIAGNOSIS — I10 Essential (primary) hypertension: Secondary | ICD-10-CM | POA: Diagnosis not present

## 2015-03-21 DIAGNOSIS — Z6833 Body mass index (BMI) 33.0-33.9, adult: Secondary | ICD-10-CM | POA: Diagnosis not present

## 2015-03-21 DIAGNOSIS — G9389 Other specified disorders of brain: Secondary | ICD-10-CM

## 2015-03-28 ENCOUNTER — Other Ambulatory Visit: Payer: Self-pay | Admitting: Cardiovascular Disease

## 2015-03-28 DIAGNOSIS — M170 Bilateral primary osteoarthritis of knee: Secondary | ICD-10-CM | POA: Diagnosis not present

## 2015-03-28 DIAGNOSIS — H2513 Age-related nuclear cataract, bilateral: Secondary | ICD-10-CM | POA: Diagnosis not present

## 2015-03-28 DIAGNOSIS — Z961 Presence of intraocular lens: Secondary | ICD-10-CM | POA: Diagnosis not present

## 2015-03-28 DIAGNOSIS — H3509 Other intraretinal microvascular abnormalities: Secondary | ICD-10-CM | POA: Diagnosis not present

## 2015-03-28 DIAGNOSIS — H402233 Chronic angle-closure glaucoma, bilateral, severe stage: Secondary | ICD-10-CM | POA: Diagnosis not present

## 2015-03-28 LAB — HM DIABETES EYE EXAM

## 2015-03-29 ENCOUNTER — Encounter: Payer: Self-pay | Admitting: Hematology

## 2015-03-29 ENCOUNTER — Ambulatory Visit (HOSPITAL_BASED_OUTPATIENT_CLINIC_OR_DEPARTMENT_OTHER): Payer: Medicare Other | Admitting: Hematology

## 2015-03-29 ENCOUNTER — Encounter: Payer: Medicare Other | Admitting: Genetic Counselor

## 2015-03-29 ENCOUNTER — Telehealth: Payer: Self-pay | Admitting: Hematology

## 2015-03-29 ENCOUNTER — Other Ambulatory Visit (HOSPITAL_BASED_OUTPATIENT_CLINIC_OR_DEPARTMENT_OTHER): Payer: Medicare Other

## 2015-03-29 VITALS — BP 183/67 | HR 80 | Temp 98.4°F | Resp 18 | Ht 64.0 in | Wt 197.5 lb

## 2015-03-29 DIAGNOSIS — N62 Hypertrophy of breast: Secondary | ICD-10-CM | POA: Diagnosis not present

## 2015-03-29 DIAGNOSIS — N6092 Unspecified benign mammary dysplasia of left breast: Secondary | ICD-10-CM

## 2015-03-29 DIAGNOSIS — D649 Anemia, unspecified: Secondary | ICD-10-CM | POA: Diagnosis present

## 2015-03-29 DIAGNOSIS — I6523 Occlusion and stenosis of bilateral carotid arteries: Secondary | ICD-10-CM

## 2015-03-29 DIAGNOSIS — D500 Iron deficiency anemia secondary to blood loss (chronic): Secondary | ICD-10-CM

## 2015-03-29 LAB — COMPREHENSIVE METABOLIC PANEL (CC13)
ALBUMIN: 4.1 g/dL (ref 3.5–5.0)
ALK PHOS: 80 U/L (ref 40–150)
ALT: 17 U/L (ref 0–55)
ANION GAP: 10 meq/L (ref 3–11)
AST: 20 U/L (ref 5–34)
BILIRUBIN TOTAL: 0.64 mg/dL (ref 0.20–1.20)
BUN: 24.2 mg/dL (ref 7.0–26.0)
CALCIUM: 9.7 mg/dL (ref 8.4–10.4)
CO2: 25 mEq/L (ref 22–29)
Chloride: 107 mEq/L (ref 98–109)
Creatinine: 1.1 mg/dL (ref 0.6–1.1)
EGFR: 57 mL/min/{1.73_m2} — AB (ref >90)
Glucose: 297 mg/dl — ABNORMAL HIGH (ref 70–140)
Potassium: 4.1 mEq/L (ref 3.5–5.1)
Sodium: 141 mEq/L (ref 136–145)
TOTAL PROTEIN: 7.4 g/dL (ref 6.4–8.3)

## 2015-03-29 LAB — CBC & DIFF AND RETIC
BASO%: 0 % (ref 0.0–2.0)
BASOS ABS: 0 10*3/uL (ref 0.0–0.1)
EOS ABS: 0 10*3/uL (ref 0.0–0.5)
EOS%: 0 % (ref 0.0–7.0)
HEMATOCRIT: 39.2 % (ref 34.8–46.6)
HEMOGLOBIN: 12.6 g/dL (ref 11.6–15.9)
IMMATURE RETIC FRACT: 12.4 % — AB (ref 1.60–10.00)
LYMPH%: 11.6 % — AB (ref 14.0–49.7)
MCH: 27.2 pg (ref 25.1–34.0)
MCHC: 32.1 g/dL (ref 31.5–36.0)
MCV: 84.7 fL (ref 79.5–101.0)
MONO#: 0.7 10*3/uL (ref 0.1–0.9)
MONO%: 4.2 % (ref 0.0–14.0)
NEUT#: 14 10*3/uL — ABNORMAL HIGH (ref 1.5–6.5)
NEUT%: 84.2 % — AB (ref 38.4–76.8)
PLATELETS: 204 10*3/uL (ref 145–400)
RBC: 4.63 10*6/uL (ref 3.70–5.45)
RDW: 15.6 % — ABNORMAL HIGH (ref 11.2–14.5)
Retic %: 1.94 % (ref 0.70–2.10)
Retic Ct Abs: 89.82 10*3/uL (ref 33.70–90.70)
WBC: 16.6 10*3/uL — ABNORMAL HIGH (ref 3.9–10.3)
lymph#: 1.9 10*3/uL (ref 0.9–3.3)

## 2015-03-29 NOTE — Progress Notes (Signed)
[place] FOLLOW UP NOTE    Patient Care Team: Laurey Morale, MD as PCP - Gackle, MD as Consulting Physician (Endocrinology) Sherren Mocha, MD as Consulting Physician (Cardiology) Ashok Pall, MD as Consulting Physician (Neurosurgery) Fanny Skates, MD (Surgeon) [place] (Gynecologist)  CHIEF COMPLAINTS:  Follow up Atypical Lobular Hyperplasia [facility])  HISTORY OF INITIAL PRESENTING ILLNESS:   [name] 69 y.o. female from [place], [name] is here because of recent diagnosis of Left breast [place] which is a precursor lesion and imparts a higher future risk for developing invasive and non-invasive breast cancers in future. She also have several comorbid conditions like Diabetes, HTN, Chronic back pain, Coronary artery disease (1 stent), TIA and fibromyalgia.  She had history of a right breast biopsy on July 27,2010 consistent with benign stromal calcifications and no malignancy. She noticed a lump in left breast this year in May 2015 as she keeps her extra money in bra and she noticed a change in her breast when she was removing money. She underwent a 3D diagnostic mammogram and an ultrasound on 11/18/2013 read as benign and there was no sonographic evidence of malignancy. Patient saw Dr Dalbert Batman in surgery after being referred by Dr Phineas Real for this palpable lump with negative imaging studies.  She then had a lumpectomy done on 01/26/2014 and the pathology from that showed [place] (Atypical Lobular Hyperplasia). There was no invasive cancer or cancer in situ and margins were negative.Pathology accession no is [id number]. She is now referred to high risk breast clinic to discuss chemoprevention strategies.she is up to date with her pap smear, colonoscopy and bone density.  In terms of breast cancer risk profile:  She menarched at early age of 3 and went to menopause at age 60 (TAH due to fibroids). She had 2 pregnancies resulting in miscarriagesy, no  biological children, 1 adopted boy.  She did not breast-fed.  She received birth control pills for couple of years and also had IUD placed.  She was never exposed to fertility medications but took HRT for few years.  She has + family history of Breast cancer. 2 maternal cousins developed breast cancer (age 31's and 61's) and her maternal aunt had breast cancer also. 1 brother with prostate cancer. 1 sister healthy as a horse. Father had prostate cancer and stomach cancer. Adopted boy is 43 years old. Mother alive at age 26 with early dementia.No ovarian cancer in family.  INTERIM HISTORY: She returns for follow up. She has started tamoxifen about 3 weeks ago, tolerated moderately well, she still has persistent diffuse joint pain, especially her knees. She went to see her orthopedic surgeon and had bilateral knee injection yesterday. She felt some immediate relief after injection. She has mild hot flashes, manageable. She is concerned about her weight gain  MEDICAL HISTORY:  Past Medical History  Diagnosis Date  . HTN (hypertension)   . Hyperlipidemia   . TIA (transient ischemic attack)     also Hx of it.   Marland Kitchen GERD (gastroesophageal reflux disease)   . Headache(784.0)   . Anemia, unspecified   . Edema   . Depressive disorder, not elsewhere classified   . Low back pain   . Obesity   . Duodenal nodule   . Hemorrhoids   . Arthritis   . Glaucoma     narrow angle, sees Dr. Herbert Deaner   . Type II or unspecified type diabetes mellitus without mention of complication, not stated as uncontrolled  sees Dr. Carrolyn Meiers   . Gastric polyps     COLON  . Allergy   . CAD (coronary artery disease)     sees. Dr. Burt Knack. s/p cath with PCI of RCA (xience des) June 2010. Pt also with LAD and D2 dzs-NL FFR in both areas med Rx  . Peripheral vascular disease   . Stroke     TIA  "many yrs ago"    . HSV (herpes simplex virus) anogenital infection 05/2014  . Cataract     sees Dr. Herbert Deaner   . Abnormal  nuclear stress test 12/29/2014  . Carotid artery occlusion     SURGICAL HISTORY: Past Surgical History  Procedure Laterality Date  . Bilateral carpal tunnel release    . Colonoscopy  05-29-11    per Dr. Henrene Pastor, benign polyps, repeat in 5 yrs    . Esophagogastroduodenoscopy  02-08-06    per Dr. Henrene Pastor, gastritis   . Glaucoma surgery      with laser, per Dr. Henrene Pastor   . Carotid endarterectomy  march 2012    per Dr. Morton Amy  . Abdominal hysterectomy  age 50    TAH and USO  Leiomyomata  . Back surgery  2008    lumb fusion  . Cholecystectomy    . Dilation and curettage of uterus    . Breast biopsy Left 01/26/2014    Procedure: EXCISION LEFT BREAST MASS;  Surgeon: Adin Hector, MD;  Location: [facility];  Service: General;  Laterality: Left;  . Cardiac catheterization    . Carpal tunnel release Left 05/14/2014    Procedure: Left Carpal tunnel release;  Surgeon: Ashok Pall, MD;  Location: [facility];  Service: Neurosurgery;  Laterality: Left;  Left Carpal tunnel release  . Ulnar nerve transposition Left 05/14/2014    Procedure: Left Ulnar nerve decompression;  Surgeon: Ashok Pall, MD;  Location: [facility];  Service: Neurosurgery;  Laterality: Left;  Left Ulnar nerve decompression  . Anterior cervical corpectomy N/A 06/11/2014    Procedure: ANTERIOR CERVICAL CORPECTOMY CERVICAL SIX; WITH CERVICAL FIVE TO CERVICAL SEVEN ARTHRODESIS;  Surgeon: Ashok Pall, MD;  Location: [facility];  Service: Neurosurgery;  Laterality: N/A;  . Cardiac catheterization N/A 12/29/2014    Procedure: Left Heart Cath and Coronary Angiography;  Surgeon: Sherren Mocha, MD;  Location: [facility];  Service: Cardiovascular;  Laterality: N/A;  . Eye surgery Left June 2016    Cataract    SOCIAL HISTORY: Social History   Social History  . Marital Status: Married    Spouse Name: N/A  . Number of Children: N/A  . Years of Education: N/A   Occupational History  . Not on file.    Social History Main Topics  . Smoking status: Former Smoker    Quit date: 01/22/1967  . Smokeless tobacco: Never Used     Comment: non smoker  . Alcohol Use: No  . Drug Use: No  . Sexual Activity: No     Comment: HYST-1st intercourse 28 yo-5 partners   Other Topics Concern  . Not on file   Social History Narrative    FAMILY HISTORY: Family History  Problem Relation Age of Onset  . Coronary artery disease Neg Hx     premature CAD  . Colon cancer Neg Hx   . Esophageal cancer Neg Hx   . Stomach cancer Neg Hx   . Hypertension Mother   . Hypertension Sister   . Hyperlipidemia Sister   .  Hypertension Brother   . Cancer Brother     PROSTATE/LUNG  . Diabetes Brother   . Heart disease Brother     Before age 44 - Bypass  . Varicose Veins Brother   . Cancer Father     Prostate and pancreatic     ALLERGIES:  has No Known Allergies.  MEDICATIONS:  Current Outpatient Prescriptions  Medication Sig Dispense Refill  . acetaminophen (TYLENOL) 500 MG tablet Take 500 mg by mouth daily.    Marland Kitchen aspirin 81 MG tablet Take 81 mg by mouth daily.     . carvedilol (COREG) 12.5 MG tablet Take 1 tablet (12.5 mg total) by mouth 2 (two) times daily with a meal. 60 tablet 6  . CRESTOR 40 MG tablet TAKE ONE TABLET BY MOUTH ONCE DAILY 30 tablet 1  . diclofenac sodium (VOLTAREN) 1 % GEL Apply 2 g topically 4 (four) times daily as needed (for pain).     Marland Kitchen ergocalciferol (VITAMIN D2) 50000 UNITS capsule Take 50,000 Units by mouth once a week. On Saturday.    . furosemide (LASIX) 40 MG tablet TAKE ONE TABLET BY MOUTH ONCE DAILY 30 tablet 0  . hydrALAZINE (APRESOLINE) 100 MG tablet Take 100 mg by mouth 3 (three) times daily.    . insulin aspart (NOVOLOG) 100 UNIT/ML injection Inject 10 Units into the skin 3 (three) times daily with meals.     . insulin glargine (LANTUS) 100 UNIT/ML injection Inject 30 Units into the skin daily.     . irbesartan (AVAPRO) 300 MG tablet Take 300 mg by mouth daily.    .  isosorbide mononitrate (IMDUR) 60 MG 24 hr tablet Take 60 mg by mouth daily.    Marland Kitchen latanoprost (XALATAN) 0.005 % ophthalmic solution Place 1 drop into both eyes daily.     . methyldopa (ALDOMET) 250 MG tablet Take 1 tablet (250 mg total) by mouth 2 (two) times daily. 60 tablet 1  . nitroGLYCERIN (NITROSTAT) 0.4 MG SL tablet Place 1 tablet (0.4 mg total) under the tongue every 5 (five) minutes as needed for chest pain. 25 tablet 1  . pantoprazole (PROTONIX) 40 MG tablet Take 1 tablet (40 mg total) by mouth daily. 90 tablet 3  . rosuvastatin (CRESTOR) 40 MG tablet Take 40 mg by mouth daily.    Marland Kitchen spironolactone (ALDACTONE) 25 MG tablet Take 25 mg by mouth daily.    . tamoxifen (NOLVADEX) 20 MG tablet Take 1 tablet (20 mg total) by mouth daily. 30 tablet 2   No current facility-administered medications for this visit.    ROS   PHYSICAL EXAMINATION: ECOG PERFORMANCE STATUS: 1  Filed Vitals:   03/29/15 0944  BP: 183/67  Pulse: 80  Temp: 98.4 F (36.9 C)  Resp: 18   Filed Weights   03/29/15 0944  Weight: 197 lb 8 oz (89.585 kg)    Physical Exam  Constitutional: She appears well-developed and well-nourished.  HENT:  Head: Normocephalic and atraumatic.  Nose: Nose normal.  Mouth/Throat: Oropharynx is clear and moist.  Eyes: Conjunctivae and EOM are normal. No scleral icterus.  Neck: Normal range of motion. Neck supple. No JVD present. No tracheal deviation present. No thyromegaly present.  Cardiovascular: Normal rate, regular rhythm and normal heart sounds.  Exam reveals no gallop and no friction rub.   No murmur heard. Pulmonary/Chest: Effort normal and breath sounds normal. No respiratory distress. She has no wheezes. She has no rales. Right breast exhibits no inverted nipple, no mass, no nipple discharge,  no skin change and no tenderness. Left breast exhibits no inverted nipple, no mass, no nipple discharge, no skin change and no tenderness. Breasts are symmetrical. There is no  breast swelling.  Abdominal: Soft. Bowel sounds are normal. She exhibits no distension. There is no tenderness. There is no rebound. No hernia.  Genitourinary: No breast bleeding.  Musculoskeletal: Normal range of motion. She exhibits no edema or tenderness.  Lymphadenopathy:    She has no cervical adenopathy.  Neurological: She is alert. She has normal reflexes. She displays normal reflexes. No cranial nerve deficit. She exhibits normal muscle tone. Coordination normal.  Skin: Skin is warm and dry. No rash noted. No erythema. No pallor.  Psychiatric: She has a normal mood and affect. Her behavior is normal. Judgment and thought content normal.  left breast (+) small lumps below the surgical scar, likely scar tissue, no other palpable breast mass or axillary nodes.   LABORATORY DATA:  CBC Latest Ref Rng 03/29/2015 03/03/2015 12/28/2014  WBC 3.9 - 10.3 10e3/uL 16.6(H) 7.7 7.1  Hemoglobin 11.6 - 15.9 g/dL 12.6 11.9 10.5(L)  Hematocrit 34.8 - 46.6 % 39.2 37.2 32.5(L)  Platelets 145 - 400 10e3/uL 204 201 174.0    CMP Latest Ref Rng 03/29/2015 03/03/2015 12/28/2014  Glucose 70 - 140 mg/dl 297(H) 186(H) 141(H)  BUN 7.0 - 26.0 mg/dL 24.2 18.3 25(H)  Creatinine 0.6 - 1.1 mg/dL 1.1 0.9 1.27(H)  Sodium 136 - 145 mEq/L 141 143 142  Potassium 3.5 - 5.1 mEq/L 4.1 3.8 3.6  Chloride 96 - 112 mEq/L - - 107  CO2 22 - 29 mEq/L 25 27 27   Calcium 8.4 - 10.4 mg/dL 9.7 9.3 9.3  Total Protein 6.4 - 8.3 g/dL 7.4 6.6 -  Total Bilirubin 0.20 - 1.20 mg/dL 0.64 0.47 -  Alkaline Phos 40 - 150 U/L 80 74 -  AST 5 - 34 U/L 20 17 -  ALT 0 - 55 U/L 17 16 -   Iron and TIBC [facility] (Order [id number])      Iron and TIBC [facility]  Status: Finalresult Visible to patient:  MyChart Nextappt: Today at 11:00 AM in Oncology ([facility]) Dx:  Anemia, unspecified anemia type           Ref Range 62mo ago  80mo ago  77mo ago     Iron 41 - 142 ug/dL 94 177 (H) 25 (L)    TIBC 236 - 444 ug/dL 350 328 380     UIBC 120 - 384 ug/dL 256 150 355    %SAT 21 - 57 % 27 54 7 (L)        Ferritin (Order [id number])      Ferritin  Status: Finalresult Visible to patient:  MyChart Nextappt: Today at 11:00 AM in Oncology ([facility]) Dx:  Anemia, unspecified anemia type           Ref Range 57mo ago  51mo ago  78mo ago     Ferritin 9 - 269 ng/ml 27 22 11           RADIOGRAPHIC STUDIES: I have personally reviewed the radiological images as listed and agreed with the findings in the report.   ASSESSMENT:   69 years old female with diagnosis of ALH (Atypical Lobular Hyperplasia) seen on a Lumpectomy of left breast. This was done for a palpable lump which was negative on mammogram and ultrasound. She had previously a right breast biopsy which was also negative for malignancy.   1. Left breast ALH  -  Dr. Lona Kettle has previously extensively discussed the risks of developing breast cancer and chemoprevention with tamoxifen versus anastrozole. The overall benefit of chemoprevention is reduce her breast cancer by 50% with absolute benefit 3.5-4% risk reduction. She agreed to take chemoprevention  -Potential side effects of tamoxifen and Arimidex were reviewed with patient again today. She opted to take Arimidex, but tolerated it poorly.  -She changed to tamoxifen 3 weeks ago, still has mild to moderate leg pains, no other side effects. She would like to continue for now. I told her it's okay to stop if she has persistent muscular and joint pain. -continue annual screening mammogram, her last one in May 2016 was in normal. -She has strong family history of breast cancer (maternal aunt and her daughter and granddaughter),  and prostate cancer in brother and father, she is interested in genetic counseling, she is going to see genetic counselor today.  2. Iron deficient anemia -she has developed moderate microcytic anemia in the past 3-4 month, ferritin 11, low iron and sat, supports iron  deficient anemia. Possible related to her prior surgery. -continue ferrous sulfate 325 mg 2-3 times a day. -Her hemoglobin has normalized, serum iron and ferritin level also normalized, I encouraged her to continue oral iron pill. -She had her last colonoscopy in 2012, polyps was removed, I encouraged her to follow-up with her GI  4. HTN, DM, CAD, history of TIA -She will continue follow-up with her primary care physician  Plan -continue tamoxifen  -Genetic counselor today -Return to clinic in 3 month with lab   Truitt Merle 03/29/2015

## 2015-03-29 NOTE — Telephone Encounter (Signed)
Pt confirmed labs/ov per 09/20 POF, gave pt AVS and Calendar... KJ °

## 2015-03-30 NOTE — Progress Notes (Signed)
Cardiology Office Note   Date:  03/31/2015   ID:  KHOLE ARTERBURN, DOB [birth year], MRN [id number]  PCP:  Laurey Morale, MD  Cardiologist:  Dr. Sherren Mocha     Chief Complaint  Patient presents with  . Follow-up    HTN  . Coronary Artery Disease     History of Present Illness: [name] is a 69 y.o. female with a hx of CAD, diabetes, HTN, prior TIA, carotid stenosis s/p R CEA. She initially presented in 2010 with unstable angina and underwent stenting of the RCA as well as pressure wire analysis of LAD/diagonal branches. She went to the emergency room in 6/16 with chest pain and elevated d-dimer. CT was negative for pulmonary embolism and cardiac enzymes were negative.  Myoview demonstrated possible small defect in the inferolateral wall. Ischemia could not be completely ruled out.  LHC performed by Dr. Burt Knack 12/29/14 demonstrated moderate nonobstructive CAD in the proximal LAD unchanged from study in 2010, patent RCA stent, stable anatomy. Medical therapy was recommended. Echocardiogram 12/30/14 demonstrated EF 60-65% with moderate diastolic dysfunction.  Last seen by Dr. Sherren Mocha 01/28/15.  She has SEs to her antihypertensive regimen and her Coreg dose was reduced.  She returns for FU.  She continues to have issues with swelling from Coreg. She only takes 6.25 mg QPM.  She also notes swelling related to Tamoxifen. She denies chest pain, dyspnea, orthopnea, PND, edema, syncope.     Studies/Reports Reviewed Today:  Carotid US 9/16 Patent R CEA L < 40%  LHC 12/29/14 LM:  Ostial 30 LAD: prox 50, dist 50; D1 60 LCx:  lum irregs RCA:  prox 30, mid stent ok Med Rx  Echo 12/30/14 - EF 60% to 65%. Wall motion was normal; Grade 2 diastolic dysfunction   Myoview 12/20/14 Possible small defect in the inferolateral wall. Ischemia cannot be completely ruled out but there is significant diaphragmatic and soft tissue attenuation on RAW images noted. This is a low risk study.  EF55-65%  Carotid US 0/9/47 R CEA site ok LICA 09%  Renal Art [place] 10/29/11 Normal renal arteries bilaterally  Past Medical History  Diagnosis Date  . HTN (hypertension)   . Hyperlipidemia   . TIA (transient ischemic attack)     also Hx of it.   Marland Kitchen GERD (gastroesophageal reflux disease)   . Headache(784.0)   . Anemia, unspecified   . Edema   . Depressive disorder, not elsewhere classified   . Low back pain   . Obesity   . Duodenal nodule   . Hemorrhoids   . Arthritis   . Glaucoma     narrow angle, sees Dr. Herbert Deaner   . Type II or unspecified type diabetes mellitus without mention of complication, not stated as uncontrolled     sees Dr. Carrolyn Meiers   . Gastric polyps     COLON  . Allergy   . CAD (coronary artery disease)     sees. Dr. Burt Knack. s/p cath with PCI of RCA (xience des) June 2010. Pt also with LAD and D2 dzs-NL FFR in both areas med Rx  . Peripheral vascular disease   . Stroke     TIA  "many yrs ago"    . HSV (herpes simplex virus) anogenital infection 05/2014  . Cataract     sees Dr. Herbert Deaner   . Abnormal nuclear stress test 12/29/2014  . Carotid artery occlusion     Past Surgical History  Procedure Laterality Date  . Bilateral  carpal tunnel release    . Colonoscopy  05-29-11    per Dr. Henrene Pastor, benign polyps, repeat in 5 yrs    . Esophagogastroduodenoscopy  02-08-06    per Dr. Henrene Pastor, gastritis   . Glaucoma surgery      with laser, per Dr. Henrene Pastor   . Carotid endarterectomy  march 2012    per Dr. Morton Amy  . Abdominal hysterectomy  age 70    TAH and USO  Leiomyomata  . Back surgery  2008    lumb fusion  . Cholecystectomy    . Dilation and curettage of uterus    . Breast biopsy Left 01/26/2014    Procedure: EXCISION LEFT BREAST MASS;  Surgeon: Adin Hector, MD;  Location: [facility];  Service: General;  Laterality: Left;  . Cardiac catheterization    . Carpal tunnel release Left 05/14/2014    Procedure: Left Carpal tunnel release;   Surgeon: Ashok Pall, MD;  Location: [facility];  Service: Neurosurgery;  Laterality: Left;  Left Carpal tunnel release  . Ulnar nerve transposition Left 05/14/2014    Procedure: Left Ulnar nerve decompression;  Surgeon: Ashok Pall, MD;  Location: [place] [facility];  Service: Neurosurgery;  Laterality: Left;  Left Ulnar nerve decompression  . Anterior cervical corpectomy N/A 06/11/2014    Procedure: ANTERIOR CERVICAL CORPECTOMY CERVICAL SIX; WITH CERVICAL FIVE TO CERVICAL SEVEN ARTHRODESIS;  Surgeon: Ashok Pall, MD;  Location: [facility];  Service: Neurosurgery;  Laterality: N/A;  . Cardiac catheterization N/A 12/29/2014    Procedure: Left Heart Cath and Coronary Angiography;  Surgeon: Sherren Mocha, MD;  Location: [facility];  Service: Cardiovascular;  Laterality: N/A;  . Eye surgery Left June 2016    Cataract     Current Outpatient Prescriptions  Medication Sig Dispense Refill  . acetaminophen (TYLENOL) 500 MG tablet Take 500 mg by mouth daily.    Marland Kitchen aspirin 81 MG tablet Take 81 mg by mouth daily.     . carvedilol (COREG) 12.5 MG tablet Take 1 tablet (12.5 mg total) by mouth 2 (two) times daily with a meal. 60 tablet 6  . CRESTOR 40 MG tablet TAKE ONE TABLET BY MOUTH ONCE DAILY 30 tablet 1  . diclofenac sodium (VOLTAREN) 1 % GEL Apply 2 g topically 4 (four) times daily as needed (for pain).     Marland Kitchen ergocalciferol (VITAMIN D2) 50000 UNITS capsule Take 50,000 Units by mouth once a week. On Saturday.    . furosemide (LASIX) 40 MG tablet TAKE ONE TABLET BY MOUTH ONCE DAILY 30 tablet 0  . hydrALAZINE (APRESOLINE) 100 MG tablet Take 100 mg by mouth 3 (three) times daily.    . insulin aspart (NOVOLOG) 100 UNIT/ML injection Inject 10 Units into the skin 3 (three) times daily with meals.     . insulin glargine (LANTUS) 100 UNIT/ML injection Inject 30 Units into the skin daily.     . irbesartan (AVAPRO) 300 MG tablet TAKE ONE TABLET BY MOUTH ONCE DAILY 30 tablet 0  . isosorbide mononitrate  (IMDUR) 60 MG 24 hr tablet Take 60 mg by mouth daily.    Marland Kitchen latanoprost (XALATAN) 0.005 % ophthalmic solution Place 1 drop into both eyes daily.     . methyldopa (ALDOMET) 250 MG tablet Take 1 tablet (250 mg total) by mouth 2 (two) times daily. 60 tablet 1  . nitroGLYCERIN (NITROSTAT) 0.4 MG SL tablet Place 1 tablet (0.4 mg total) under the tongue every 5 (five) minutes as  needed for chest pain. 25 tablet 1  . rosuvastatin (CRESTOR) 40 MG tablet Take 40 mg by mouth daily.    Marland Kitchen spironolactone (ALDACTONE) 25 MG tablet TAKE ONE TABLET BY MOUTH ONCE DAILY 30 tablet 0  . tamoxifen (NOLVADEX) 20 MG tablet Take 1 tablet (20 mg total) by mouth daily. 30 tablet 2  . timolol (TIMOPTIC) 0.5 % ophthalmic solution Place 1 drop into the right eye daily.     No current facility-administered medications for this visit.    Allergies:   Review of patient's allergies indicates no known allergies.    Social History:  The patient  reports that she quit smoking about 48 years ago. She has never used smokeless tobacco. She reports that she does not drink alcohol or use illicit drugs.   Family History:  [name] family history includes Cancer in her brother and father; Diabetes in her brother; Heart disease in her brother; Hyperlipidemia in her sister; Hypertension in her brother, mother, and sister; Varicose Veins in her brother. There is no history of Coronary artery disease, Colon cancer, Esophageal cancer, or Stomach cancer.    ROS:   Please see the history of present illness.   Review of Systems  Constitution: Positive for diaphoresis.  Cardiovascular: Positive for leg swelling.  Musculoskeletal: Positive for back pain and joint swelling.  All other systems reviewed and are negative.     PHYSICAL EXAM: VS:  BP 138/62 mmHg  Pulse 69  Ht 5\' 4"  (1.626 m)  Wt 198 lb 6.4 oz (89.994 kg)  BMI 34.04 kg/m2    Wt Readings from Last 3 Encounters:  03/31/15 198 lb 6.4 oz (89.994 kg)  03/29/15 197 lb 8 oz  (89.585 kg)  03/10/15 195 lb (88.451 kg)     GEN: Well nourished, well developed, in no acute distress HEENT: normal Neck: no JVD,  no masses Cardiac:  Normal S1/S2, RRR; no murmur ,  no rubs or gallops, no edema  Respiratory:  clear to auscultation bilaterally, no wheezing, rhonchi or rales. GI: soft, nontender, nondistended, + BS MS: no deformity or atrophy Skin: warm and dry  Neuro:  CNs II-XII intact, Strength and sensation are intact Psych: Normal affect   EKG:  EKG is ordered today.  It demonstrates:   NSR, HR 69, normal axis, no ST changes, no change from prior tracing.   Recent Labs: 12/28/2014: TSH 2.36 03/29/2015: ALT 17; BUN 24.2; Creatinine 1.1; HGB 12.6; Platelets 204; Potassium 4.1; Sodium 141    Lipid Panel    Component Value Date/Time   CHOL 153 12/28/2014 1056   TRIG 119.0 12/28/2014 1056   HDL 35.70* 12/28/2014 1056   CHOLHDL 4 12/28/2014 1056   VLDL 23.8 12/28/2014 1056   LDLCALC 94 12/28/2014 1056      ASSESSMENT AND PLAN:  1. Coronary artery disease:   Recent LHC with stable anatomy and patent RCA stent.  Continue beta-blocker, ASA, nitrates, statin.  No angina.   2. Essential hypertension:  Patient with SEs to beta-blocker and dose was adjusted at last visit.  She continues to have issues with edema that she attributes to the Coreg.  She tolerated Toprol-XL just fine in the past. I reviewed her chart and she was changed from Toprol to [place] in 11/2014 due to uncontrolled BP.  She could not tolerate Amlodipine due to edema.  She also did not tolerated Clonidine. She currently takes Lasix, Spironolactone, Hydralazine, Methyldopa, Avapro, Imdur, Coreg.  There are not a lot of options to  adjust her HTN regimen.  Question if she has a component of white coat HTN.  Also, I suspect her edema is related to venous insufficiency more that medications.  In any event, I will have her stop the Coreg.  Restart Toprol-XL 100 mg QD.  I have asked her to get a BP cuff and to  record several BPs.  Refer to the HTN clinic for FU in 3-4 weeks.   3. Dyslipidemia:  Continue statin.   4. Carotid stenosis, bilateral:  Followed by [facility].     Medication Changes: Current medicines are reviewed at length with the patient today.  Concerns regarding medicines are as outlined above.  The following changes have been made:   Discontinued Medications   IRBESARTAN (AVAPRO) 300 MG TABLET    Take 300 mg by mouth daily.   PANTOPRAZOLE (PROTONIX) 40 MG TABLET    Take 1 tablet (40 mg total) by mouth daily.   SPIRONOLACTONE (ALDACTONE) 25 MG TABLET    Take 25 mg by mouth daily.   Modified Medications   No medications on file   New Prescriptions   No medications on file   Labs/ tests ordered today include:   No orders of the defined types were placed in this encounter.     Disposition:    FU HTN clinic in 3-4 weeks.  FU with Dr. Sherren Mocha in 6 mos.     Signed, Versie Starks, MHS 03/31/2015 10:52 AM    [facility] [address], [place], [place]  [address] Phone: [phone]; Fax: [phone]

## 2015-03-31 ENCOUNTER — Ambulatory Visit (INDEPENDENT_AMBULATORY_CARE_PROVIDER_SITE_OTHER): Payer: Medicare Other | Admitting: Physician Assistant

## 2015-03-31 ENCOUNTER — Encounter: Payer: Self-pay | Admitting: Physician Assistant

## 2015-03-31 VITALS — BP 138/62 | HR 69 | Ht 64.0 in | Wt 198.4 lb

## 2015-03-31 DIAGNOSIS — I6523 Occlusion and stenosis of bilateral carotid arteries: Secondary | ICD-10-CM

## 2015-03-31 DIAGNOSIS — I10 Essential (primary) hypertension: Secondary | ICD-10-CM | POA: Diagnosis not present

## 2015-03-31 DIAGNOSIS — E785 Hyperlipidemia, unspecified: Secondary | ICD-10-CM

## 2015-03-31 DIAGNOSIS — I2510 Atherosclerotic heart disease of native coronary artery without angina pectoris: Secondary | ICD-10-CM

## 2015-03-31 MED ORDER — METOPROLOL SUCCINATE ER 100 MG PO TB24: 100.0000 mg | ORAL_TABLET | Freq: Every day | ORAL | Status: DC

## 2015-03-31 NOTE — Patient Instructions (Addendum)
Medication Instructions:  1. STOP COREG  2. START TOPROL XL 100 MG DAILY; RX  SENT IN  3. YOU HAVE BEEN GIVEN AN RX FOR A BP CUFF  Labwork: NONE  Testing/Procedures: NONE  Follow-Up: 1. Your physician wants you to follow-up in: [place] will receive a reminder letter in the mail two months in advance. If you don't receive a letter, please call our office to schedule the follow-up appointment.  2. PLEASE SCHEDULE AN APPT WITH THE BLOOD PRESSURE CLINIC  TO BE DONE IN 3-4 WEEKS  Any Other Special Instructions Will Be Listed Below (If Applicable). PICK UP A BLOOD PRESSURE CUFF AND CHECK BP ONCE A DAY, 3-4 DAYS A WEEK; BRING LET OF READINGS TO BP APPT

## 2015-04-07 ENCOUNTER — Ambulatory Visit
Admission: RE | Admit: 2015-04-07 | Discharge: 2015-04-07 | Disposition: A | Discharge status: Home / Self Care | Payer: Medicare Other | Source: Ambulatory Visit | Attending: Neurosurgery | Admitting: Neurosurgery

## 2015-04-07 DIAGNOSIS — G9389 Other specified disorders of brain: Secondary | ICD-10-CM

## 2015-04-07 DIAGNOSIS — H579 Unspecified disorder of eye and adnexa: Secondary | ICD-10-CM | POA: Diagnosis not present

## 2015-04-07 MED ORDER — GADOBENATE DIMEGLUMINE 529 MG/ML IV SOLN: 18.0000 mL | Freq: Once | INTRAVENOUS | Status: DC | PRN

## 2015-04-08 NOTE — Progress Notes (Signed)
This encounter was created in error - please disregard.

## 2015-04-14 ENCOUNTER — Encounter: Payer: Self-pay | Admitting: Podiatry

## 2015-04-14 ENCOUNTER — Ambulatory Visit (INDEPENDENT_AMBULATORY_CARE_PROVIDER_SITE_OTHER): Payer: Medicare Other | Admitting: Podiatry

## 2015-04-14 VITALS — BP 138/61 | HR 74 | Resp 16

## 2015-04-14 DIAGNOSIS — M722 Plantar fascial fibromatosis: Secondary | ICD-10-CM | POA: Diagnosis not present

## 2015-04-14 DIAGNOSIS — Z23 Encounter for immunization: Secondary | ICD-10-CM | POA: Diagnosis not present

## 2015-04-14 DIAGNOSIS — M779 Enthesopathy, unspecified: Secondary | ICD-10-CM

## 2015-04-14 MED ORDER — TRIAMCINOLONE ACETONIDE 10 MG/ML IJ SUSP
10.0000 mg | Freq: Once | INTRAMUSCULAR | Status: AC
  Administered 2015-04-14: 10 mg

## 2015-04-14 NOTE — Progress Notes (Signed)
Subjective:     Patient ID: [name], female   DOB: 09-12-1945, 69 y.o.   MRN: [id number]  HPI patient states I have pain left foot over right foot in the lateral side of the heel with discomfort upon ambulation and especially when I get up in the morning   Review of Systems     Objective:   Physical Exam Neurovascular status intact muscle strength adequate with exquisite discomfort lateral side foot left over right with peroneal inflammation and fluid buildup when palpated. Patient has minimal medial pain noted    Assessment:     Peroneal tendinitis left over right secondary to foot structure with obesity is complicating factor    Plan:     H&P and x-rays reviewed. Today I did a sheath injection left 3 mg Kenalog 5 mg Xylocaine and applied fascial brace to lift the lateral side of the foot up and instructed on ice therapy and reduced activity. Reappoint if symptoms persist

## 2015-04-22 ENCOUNTER — Other Ambulatory Visit: Payer: Self-pay | Admitting: Cardiovascular Disease

## 2015-04-26 ENCOUNTER — Encounter: Payer: Self-pay | Admitting: Family Medicine

## 2015-04-26 DIAGNOSIS — G939 Disorder of brain, unspecified: Secondary | ICD-10-CM | POA: Diagnosis not present

## 2015-04-26 DIAGNOSIS — Z6834 Body mass index (BMI) 34.0-34.9, adult: Secondary | ICD-10-CM | POA: Diagnosis not present

## 2015-04-26 DIAGNOSIS — M4712 Other spondylosis with myelopathy, cervical region: Secondary | ICD-10-CM | POA: Diagnosis not present

## 2015-05-01 ENCOUNTER — Other Ambulatory Visit: Payer: Self-pay | Admitting: Cardiovascular Disease

## 2015-05-17 DIAGNOSIS — H2511 Age-related nuclear cataract, right eye: Secondary | ICD-10-CM | POA: Diagnosis not present

## 2015-05-17 DIAGNOSIS — H538 Other visual disturbances: Secondary | ICD-10-CM | POA: Diagnosis not present

## 2015-05-31 DIAGNOSIS — F329 Major depressive disorder, single episode, unspecified: Secondary | ICD-10-CM | POA: Diagnosis not present

## 2015-05-31 DIAGNOSIS — M4806 Spinal stenosis, lumbar region: Secondary | ICD-10-CM | POA: Diagnosis not present

## 2015-05-31 DIAGNOSIS — E784 Other hyperlipidemia: Secondary | ICD-10-CM | POA: Diagnosis not present

## 2015-05-31 DIAGNOSIS — Z6833 Body mass index (BMI) 33.0-33.9, adult: Secondary | ICD-10-CM | POA: Diagnosis not present

## 2015-05-31 DIAGNOSIS — E1159 Type 2 diabetes mellitus with other circulatory complications: Secondary | ICD-10-CM | POA: Diagnosis not present

## 2015-05-31 DIAGNOSIS — I6523 Occlusion and stenosis of bilateral carotid arteries: Secondary | ICD-10-CM | POA: Diagnosis not present

## 2015-05-31 DIAGNOSIS — M5416 Radiculopathy, lumbar region: Secondary | ICD-10-CM | POA: Diagnosis not present

## 2015-05-31 DIAGNOSIS — Z1389 Encounter for screening for other disorder: Secondary | ICD-10-CM | POA: Diagnosis not present

## 2015-05-31 DIAGNOSIS — D649 Anemia, unspecified: Secondary | ICD-10-CM | POA: Diagnosis not present

## 2015-05-31 DIAGNOSIS — E559 Vitamin D deficiency, unspecified: Secondary | ICD-10-CM | POA: Diagnosis not present

## 2015-05-31 DIAGNOSIS — N62 Hypertrophy of breast: Secondary | ICD-10-CM | POA: Diagnosis not present

## 2015-05-31 DIAGNOSIS — K635 Polyp of colon: Secondary | ICD-10-CM | POA: Diagnosis not present

## 2015-06-01 ENCOUNTER — Other Ambulatory Visit: Payer: Self-pay | Admitting: Cardiovascular Disease

## 2015-06-06 NOTE — Telephone Encounter (Signed)
Okay to refill under Dr Burt Knack.

## 2015-06-06 NOTE — Telephone Encounter (Signed)
Hey this pt wants imdur refill, was done by PCP, will Burt Knack take over?

## 2015-06-27 ENCOUNTER — Other Ambulatory Visit: Payer: Self-pay | Admitting: *Deleted

## 2015-06-27 DIAGNOSIS — N6092 Unspecified benign mammary dysplasia of left breast: Secondary | ICD-10-CM

## 2015-06-27 DIAGNOSIS — D509 Iron deficiency anemia, unspecified: Secondary | ICD-10-CM

## 2015-06-28 ENCOUNTER — Other Ambulatory Visit: Payer: Medicare Other

## 2015-06-28 ENCOUNTER — Encounter: Payer: Medicare Other | Admitting: Hematology

## 2015-06-28 ENCOUNTER — Encounter: Payer: Self-pay | Admitting: Hematology

## 2015-06-28 NOTE — Progress Notes (Signed)
   ROS   Physical Exam  Constitutional: She appears well-developed and well-nourished.  HENT:  Head: Normocephalic and atraumatic.  Nose: Nose normal.  Mouth/Throat: Oropharynx is clear and moist.  Eyes: Conjunctivae and EOM are normal. No scleral icterus.  Neck: Normal range of motion. Neck supple. No JVD present. No tracheal deviation present. No thyromegaly present.  Cardiovascular: Normal rate, regular rhythm and normal heart sounds.  Exam reveals no gallop and no friction rub.   No murmur heard. Pulmonary/Chest: Effort normal and breath sounds normal. No respiratory distress. She has no wheezes. She has no rales. Right breast exhibits no inverted nipple, no mass, no nipple discharge, no skin change and no tenderness. Left breast exhibits no inverted nipple, no mass, no nipple discharge, no skin change and no tenderness. Breasts are symmetrical. There is no breast swelling.  Abdominal: Soft. Bowel sounds are normal. She exhibits no distension. There is no tenderness. There is no rebound. No hernia.  Genitourinary: No breast bleeding.  Musculoskeletal: Normal range of motion. She exhibits no edema or tenderness.  Lymphadenopathy:    She has no cervical adenopathy.  Neurological: She is alert. She has normal reflexes. She displays normal reflexes. No cranial nerve deficit. She exhibits normal muscle tone. Coordination normal.  Skin: Skin is warm and dry. No rash noted. No erythema. No pallor.  Psychiatric: She has a normal mood and affect. Her behavior is normal. Judgment and thought content normal.   This encounter was created in error - please disregard.

## 2015-06-29 ENCOUNTER — Telehealth: Payer: Self-pay | Admitting: Hematology

## 2015-06-29 ENCOUNTER — Other Ambulatory Visit: Payer: Self-pay | Admitting: *Deleted

## 2015-06-29 NOTE — Telephone Encounter (Signed)
cld to r/s missed appt-pt stated willc all and r/s was driving

## 2015-07-05 DIAGNOSIS — M170 Bilateral primary osteoarthritis of knee: Secondary | ICD-10-CM | POA: Diagnosis not present

## 2015-07-07 ENCOUNTER — Ambulatory Visit (INDEPENDENT_AMBULATORY_CARE_PROVIDER_SITE_OTHER): Payer: Medicare Other | Admitting: Podiatry

## 2015-07-07 ENCOUNTER — Encounter: Payer: Self-pay | Admitting: Podiatry

## 2015-07-07 DIAGNOSIS — M779 Enthesopathy, unspecified: Secondary | ICD-10-CM

## 2015-07-07 MED ORDER — TRIAMCINOLONE ACETONIDE 10 MG/ML IJ SUSP
10.0000 mg | Freq: Once | INTRAMUSCULAR | Status: AC
  Administered 2015-07-07: 10 mg

## 2015-07-07 NOTE — Progress Notes (Signed)
Subjective:     Patient ID: [name], female   DOB: 1945/12/12, 69 y.o.   MRN: [id number]  HPI patient presents with continued discomfort in the lateral side of the left heel and into the posterior ankle joint. It is localized with no indications of tendon dysfunction currently   Review of Systems     Objective:   Physical Exam Inflammatory condition consistent with tendinitis of the left lateral peroneal group and also moderate inflammation of the lateral ankle gutter    Assessment:     Tendinitis of the above-mentioned groups    Plan:     Careful sheath injection administered and I did dispense an air fracture walker to completely immobilize and allow rest to occur. We'll utilize ice physical therapy and will be seen back to reevaluate

## 2015-07-08 ENCOUNTER — Other Ambulatory Visit: Payer: Self-pay | Admitting: Cardiovascular Disease

## 2015-07-14 DIAGNOSIS — M170 Bilateral primary osteoarthritis of knee: Secondary | ICD-10-CM | POA: Diagnosis not present

## 2015-07-19 DIAGNOSIS — M170 Bilateral primary osteoarthritis of knee: Secondary | ICD-10-CM | POA: Diagnosis not present

## 2015-07-28 ENCOUNTER — Telehealth: Payer: Self-pay | Admitting: Hematology

## 2015-07-28 ENCOUNTER — Other Ambulatory Visit: Payer: Self-pay | Admitting: Cardiovascular Disease

## 2015-07-28 NOTE — Telephone Encounter (Signed)
pt cld to r/s missed appt-gave pt time & date of r/s appts

## 2015-08-04 ENCOUNTER — Ambulatory Visit (INDEPENDENT_AMBULATORY_CARE_PROVIDER_SITE_OTHER): Payer: Medicare Other | Admitting: Podiatry

## 2015-08-04 ENCOUNTER — Encounter: Payer: Self-pay | Admitting: Podiatry

## 2015-08-04 VITALS — BP 102/45 | HR 65 | Resp 16

## 2015-08-04 DIAGNOSIS — M779 Enthesopathy, unspecified: Secondary | ICD-10-CM

## 2015-08-04 DIAGNOSIS — M722 Plantar fascial fibromatosis: Secondary | ICD-10-CM

## 2015-08-04 NOTE — Progress Notes (Signed)
Subjective:     Patient ID: [name], female   DOB: [birth year], 70 y.o.   MRN: [id number]  HPI patient states I'm doing well with my left foot but I still gets some shooting pains at times and I had trouble wearing the brace and the boot   Review of Systems     Objective:   Physical Exam Neurovascular status intact with continued discomfort in the lateral aspect of the left ankle around the peroneal tendon group with still sharp pain when I inverted the foot and everted the foot. Tendon appears to be functioning but does have edema within    Assessment:     Possibility for tear of the peroneal tendon left versus inflammatory tendinitis    Plan:     I instructed on physical therapy and wearing supportive shoes and I did dispense another fascial brace with all instructions on usage and we she will be seen back as needed and may need MRI if symptoms continue

## 2015-08-24 ENCOUNTER — Encounter: Payer: Self-pay | Admitting: Hematology

## 2015-08-24 ENCOUNTER — Encounter: Payer: Self-pay | Admitting: Genetic Counselor

## 2015-08-24 ENCOUNTER — Telehealth: Payer: Self-pay | Admitting: Hematology

## 2015-08-24 ENCOUNTER — Ambulatory Visit (HOSPITAL_BASED_OUTPATIENT_CLINIC_OR_DEPARTMENT_OTHER): Payer: Self-pay | Admitting: Genetic Counselor

## 2015-08-24 ENCOUNTER — Ambulatory Visit (HOSPITAL_BASED_OUTPATIENT_CLINIC_OR_DEPARTMENT_OTHER): Payer: Medicare Other | Admitting: Hematology

## 2015-08-24 ENCOUNTER — Other Ambulatory Visit (HOSPITAL_BASED_OUTPATIENT_CLINIC_OR_DEPARTMENT_OTHER): Payer: Medicare Other

## 2015-08-24 VITALS — BP 162/51 | HR 66 | Temp 98.8°F | Resp 17 | Ht 64.0 in | Wt 199.9 lb

## 2015-08-24 DIAGNOSIS — Z780 Asymptomatic menopausal state: Secondary | ICD-10-CM

## 2015-08-24 DIAGNOSIS — I10 Essential (primary) hypertension: Secondary | ICD-10-CM | POA: Diagnosis not present

## 2015-08-24 DIAGNOSIS — E119 Type 2 diabetes mellitus without complications: Secondary | ICD-10-CM

## 2015-08-24 DIAGNOSIS — N62 Hypertrophy of breast: Secondary | ICD-10-CM

## 2015-08-24 DIAGNOSIS — Z803 Family history of malignant neoplasm of breast: Secondary | ICD-10-CM

## 2015-08-24 DIAGNOSIS — N6092 Unspecified benign mammary dysplasia of left breast: Secondary | ICD-10-CM

## 2015-08-24 DIAGNOSIS — Z1231 Encounter for screening mammogram for malignant neoplasm of breast: Secondary | ICD-10-CM

## 2015-08-24 DIAGNOSIS — Z800 Family history of malignant neoplasm of digestive organs: Secondary | ICD-10-CM

## 2015-08-24 DIAGNOSIS — D509 Iron deficiency anemia, unspecified: Secondary | ICD-10-CM | POA: Diagnosis not present

## 2015-08-24 DIAGNOSIS — Z8042 Family history of malignant neoplasm of prostate: Secondary | ICD-10-CM

## 2015-08-24 DIAGNOSIS — G8929 Other chronic pain: Secondary | ICD-10-CM

## 2015-08-24 DIAGNOSIS — Z315 Encounter for genetic counseling: Secondary | ICD-10-CM

## 2015-08-24 LAB — COMPREHENSIVE METABOLIC PANEL
ALT: 19 U/L (ref 0–55)
AST: 21 U/L (ref 5–34)
Albumin: 3.9 g/dL (ref 3.5–5.0)
Alkaline Phosphatase: 84 U/L (ref 40–150)
Anion Gap: 9 mEq/L (ref 3–11)
BILIRUBIN TOTAL: 0.65 mg/dL (ref 0.20–1.20)
BUN: 15.8 mg/dL (ref 7.0–26.0)
CHLORIDE: 103 meq/L (ref 98–109)
CO2: 29 meq/L (ref 22–29)
CREATININE: 1.1 mg/dL (ref 0.6–1.1)
Calcium: 9.6 mg/dL (ref 8.4–10.4)
EGFR: 61 mL/min/{1.73_m2} — ABNORMAL LOW (ref >90)
GLUCOSE: 243 mg/dL — AB (ref 70–140)
Potassium: 3.7 mEq/L (ref 3.5–5.1)
Sodium: 141 mEq/L (ref 136–145)
TOTAL PROTEIN: 7.2 g/dL (ref 6.4–8.3)

## 2015-08-24 LAB — CBC WITH DIFFERENTIAL/PLATELET
BASO%: 0.2 % (ref 0.0–2.0)
BASOS ABS: 0 10*3/uL (ref 0.0–0.1)
EOS ABS: 0.1 10*3/uL (ref 0.0–0.5)
EOS%: 1.3 % (ref 0.0–7.0)
HEMATOCRIT: 40 % (ref 34.8–46.6)
HEMOGLOBIN: 12.8 g/dL (ref 11.6–15.9)
LYMPH#: 2.3 10*3/uL (ref 0.9–3.3)
LYMPH%: 27.5 % (ref 14.0–49.7)
MCH: 26.2 pg (ref 25.1–34.0)
MCHC: 32 g/dL (ref 31.5–36.0)
MCV: 81.8 fL (ref 79.5–101.0)
MONO#: 0.4 10*3/uL (ref 0.1–0.9)
MONO%: 4.9 % (ref 0.0–14.0)
NEUT#: 5.5 10*3/uL (ref 1.5–6.5)
NEUT%: 66.1 % (ref 38.4–76.8)
NRBC: 0 % (ref 0–0)
PLATELETS: 194 10*3/uL (ref 145–400)
RBC: 4.89 10*6/uL (ref 3.70–5.45)
RDW: 16.7 % — AB (ref 11.2–14.5)
WBC: 8.4 10*3/uL (ref 3.9–10.3)

## 2015-08-24 LAB — DRAW EXTRA CLOT TUBE

## 2015-08-24 NOTE — Telephone Encounter (Signed)
Pt confirmed labs/ov per 02/15 POF, gave pt AVS and Calendar.... KJ °

## 2015-08-24 NOTE — Progress Notes (Signed)
REFERRING PROVIDER: Laurey Morale, MD [place], [place] [id number]   Truitt Merle, MD  PRIMARY PROVIDER:  Laurey Morale, MD  PRIMARY REASON FOR VISIT:  1. Atypical lobular hyperplasia of left breast   2. Atypical ductal hyperplasia of left breast   3. Family history of breast cancer   4. Family history of prostate cancer   5. Family history of stomach cancer      HISTORY OF PRESENT ILLNESS:   [name], a 70 y.o. female, was seen for a [place] cancer genetics consultation at the request of Dr. Burr Medico due to a personal and family history of cancer.  [name] presents to clinic today to discuss the possibility of a hereditary predisposition to cancer, genetic testing, and to further clarify her future cancer risks, as well as potential cancer risks for family members.   In 2015, at the age of 51, [name] was diagnosed with atypical ductal hyperplasia of the left breast. This was treated with lumpectomy.    CANCER HISTORY:   No history exists.     HORMONAL RISK FACTORS:  Menarche was at age 71.  First live birth at age N/A.  OCP use for approximately 10 years.  Ovaries intact: only one.  Hysterectomy: yes.  Menopausal status: postmenopausal.  HRT use: 0 years. Colonoscopy: yes; 1-2 polyps. Mammogram within the last year: yes. Number of breast biopsies: 1. Up to date with pelvic exams:  yes. Any excessive radiation exposure in the past:  no  Past Medical History  Diagnosis Date  . HTN (hypertension)   . Hyperlipidemia   . TIA (transient ischemic attack)     also Hx of it.   Marland Kitchen GERD (gastroesophageal reflux disease)   . Headache(784.0)   . Anemia, unspecified   . Edema   . Depressive disorder, not elsewhere classified   . Low back pain   . Obesity   . Duodenal nodule   . Hemorrhoids   . Arthritis   . Glaucoma     narrow angle, sees Dr. Herbert Deaner   . Type II or unspecified type diabetes mellitus without mention of complication, not  stated as uncontrolled     sees Dr. Carrolyn Meiers   . Gastric polyps     COLON  . Allergy   . CAD (coronary artery disease)     sees. Dr. Burt Knack. s/p cath with PCI of RCA (xience des) June 2010. Pt also with LAD and D2 dzs-NL FFR in both areas med Rx  . Peripheral vascular disease ([place])   . Stroke [facility])     TIA  "many yrs ago"    . HSV (herpes simplex virus) anogenital infection 05/2014  . Cataract     sees Dr. Herbert Deaner   . Abnormal nuclear stress test 12/29/2014  . Carotid artery occlusion   . Family history of breast cancer   . Family history of prostate cancer   . Family history of stomach cancer     Past Surgical History  Procedure Laterality Date  . Bilateral carpal tunnel release    . Colonoscopy  05-29-11    per Dr. Henrene Pastor, benign polyps, repeat in 5 yrs    . Esophagogastroduodenoscopy  02-08-06    per Dr. Henrene Pastor, gastritis   . Glaucoma surgery      with laser, per Dr. Henrene Pastor   . Carotid endarterectomy  march 2012    per Dr. Morton Amy  . Abdominal hysterectomy  age 28    TAH  and USO  Leiomyomata  . Back surgery  2008    lumb fusion  . Cholecystectomy    . Dilation and curettage of uterus    . Breast biopsy Left 01/26/2014    Procedure: EXCISION LEFT BREAST MASS;  Surgeon: Adin Hector, MD;  Location: [facility];  Service: General;  Laterality: Left;  . Cardiac catheterization    . Carpal tunnel release Left 05/14/2014    Procedure: Left Carpal tunnel release;  Surgeon: Ashok Pall, MD;  Location: [facility];  Service: Neurosurgery;  Laterality: Left;  Left Carpal tunnel release  . Ulnar nerve transposition Left 05/14/2014    Procedure: Left Ulnar nerve decompression;  Surgeon: Ashok Pall, MD;  Location: [facility];  Service: Neurosurgery;  Laterality: Left;  Left Ulnar nerve decompression  . Anterior cervical corpectomy N/A 06/11/2014    Procedure: ANTERIOR CERVICAL CORPECTOMY CERVICAL SIX; WITH CERVICAL FIVE TO CERVICAL SEVEN ARTHRODESIS;   Surgeon: Ashok Pall, MD;  Location: [facility];  Service: Neurosurgery;  Laterality: N/A;  . Cardiac catheterization N/A 12/29/2014    Procedure: Left Heart Cath and Coronary Angiography;  Surgeon: Sherren Mocha, MD;  Location: [facility];  Service: Cardiovascular;  Laterality: N/A;  . Eye surgery Left June 2016    Cataract    Social History   Social History  . Marital Status: Married    Spouse Name: N/A  . Number of Children: 1  . Years of Education: N/A   Social History Main Topics  . Smoking status: Former Smoker    Quit date: 01/22/1967  . Smokeless tobacco: Never Used     Comment: non smoker  . Alcohol Use: No  . Drug Use: No  . Sexual Activity: No     Comment: HYST-1st intercourse 27 yo-5 partners   Other Topics Concern  . None   Social History Narrative     FAMILY HISTORY:  We obtained a detailed, 4-generation family history.  Significant diagnoses are listed below: Family History  Problem Relation Age of Onset  . Coronary artery disease Neg Hx     premature CAD  . Colon cancer Neg Hx   . Esophageal cancer Neg Hx   . Hypertension Mother   . Hypertension Sister   . Hyperlipidemia Sister   . Hypertension Brother   . Cancer Brother     PROSTATE/LUNG  . Diabetes Brother   . Heart disease Brother     Before age 15 - Bypass  . Varicose Veins Brother   . Cancer Father     Prostate and pancreatic   . Stomach cancer Maternal Grandmother 99  . Heart disease Maternal Grandfather   . Breast cancer Maternal Aunt 41  . Cancer Maternal Aunt     cancer NOS  . Breast cancer Cousin 28    maternal first cousin  . Prostate cancer Cousin 14  . Breast cancer Cousin 63  . Breast cancer Cousin 70    maternal first cousin's daughter    The patient has an adopted son who is the brother of her brother's adopted son.  She has one brother and one sister, her brother was diagnosed with prostate cancer at 67 and died at 70.  Her father died of prostate cancer at  70.  He did not have a family history of cancer.  Her mother is alive at 65.  She had six siblings.  One sister had breast cancer at 53, and had a daughter who had breast  cancer in her 9s and a granddaughter who had breast cancer around 30.  Another sister had an unknown cancer and had a son with prostate cancer.  Lastly, a brother had a daughter with breast cancer.  [name] maternal grandmother had stomach cancer at 56. Patient's maternal ancestors are of [place] American and Native American descent, and paternal ancestors are of [place] American nad Native American descent. There is no reported Ashkenazi Jewish ancestry. There is no known consanguinity.  GENETIC COUNSELING ASSESSMENT: NAVEYA ELLERMAN is a 70 y.o. female with a family history of cancer which is somewhat suggestive of a hereditary cancer syndrome and predisposition to cancer. We, therefore, discussed and recommended the following at today's visit.   DISCUSSION: We discussed that about 5-10% of breast cancer is hereditary, with most cases being the result of BRCA mutations.  Other genes associated with an increased risk for breast cancer include ATM, CHEK2 and PALB2.  We reviewed the characteristics, features and inheritance patterns of hereditary cancer syndromes. We also discussed genetic testing, including the appropriate family members to test, the process of testing, insurance coverage and turn-around-time for results. We discussed the implications of a negative, positive and/or variant of uncertain significant result. We recommended Ms. Dutton pursue genetic testing for the Breast/Ovarian cancer gene panel. The Breast/Ovarian gene panel offered by [facility] includes sequencing and rearrangement analysis for the following 20 genes:  ATM, BARD1, BRCA1, BRCA2, BRIP1, CDH1, CHEK2, EPCAM, FANCC, MLH1, MSH2, MSH6, NBN, PALB2, PMS2, PTEN, RAD51C, RAD51D, TP53, and XRCC2.     PLAN: After considering the risks, benefits, and limitations,  Ms. Braziel  provided informed consent to pursue genetic testing and the blood sample was sent to [facility] for analysis of the Breast/Ovarian cancer gene panel. Results should be available within approximately 2-3 weeks' time, at which point they will be disclosed by telephone to Ms. Farias, as will any additional recommendations warranted by these results. Ms. Leuthold will receive a summary of her genetic counseling visit and a copy of her results once available. This information will also be available in Epic. We encouraged Ms. Culhane to remain in contact with cancer genetics annually so that we can continuously update the family history and inform her of any changes in cancer genetics and testing that may be of benefit for her family. Ms. Culbertson questions were answered to her satisfaction today. Our contact information was provided should additional questions or concerns arise.  Lastly, we encouraged Ms. Demorest to remain in contact with cancer genetics annually so that we can continuously update the family history and inform her of any changes in cancer genetics and testing that may be of benefit for this family.   Ms.  Gerhart questions were answered to her satisfaction today. Our contact information was provided should additional questions or concerns arise. Thank you for the referral and allowing [place] to share in the care of your patient.   Menucha Dicesare P. Florene Glen, [place], [facility] Certified Genetic Counselor [email or web address] phone: [phone]  The patient was seen for a total of 60 minutes in face-to-face genetic counseling.  This patient was discussed with Drs. Magrinat, Lindi Adie and/or Burr Medico who agrees with the above.    _______________________________________________________________________ For Office Staff:  Number of people involved in session: 1 Was an Intern/ student involved with case: no

## 2015-08-24 NOTE — Progress Notes (Signed)
[facility] FOLLOW UP NOTE 08/24/2015   Patient Care Team: Laurey Morale, MD as PCP - McCloud, MD as Consulting Physician (Endocrinology) Sherren Mocha, MD as Consulting Physician (Cardiology) Ashok Pall, MD as Consulting Physician (Neurosurgery) Earlie Server, MD as Consulting Physician (Orthopedic Surgery) Fanny Skates, MD (Surgeon) [place] (Gynecologist)  CHIEF COMPLAINTS:  Follow up Atypical Lobular Hyperplasia [facility])  HISTORY OF INITIAL PRESENTING ILLNESS:   [name] 71 y.o. female from [place], [name] is here because of recent diagnosis of Left breast [place] which is a precursor lesion and imparts a higher future risk for developing invasive and non-invasive breast cancers in future. She also have several comorbid conditions like Diabetes, HTN, Chronic back pain, Coronary artery disease (1 stent), TIA and fibromyalgia.  She had history of a right breast biopsy on July 27,2010 consistent with benign stromal calcifications and no malignancy. She noticed a lump in left breast this year in May 2015 as she keeps her extra money in bra and she noticed a change in her breast when she was removing money. She underwent a 3D diagnostic mammogram and an ultrasound on 11/18/2013 read as benign and there was no sonographic evidence of malignancy. Patient saw Dr Dalbert Batman in surgery after being referred by Dr Phineas Real for this palpable lump with negative imaging studies.  She then had a lumpectomy done on 01/26/2014 and the pathology from that showed [place] (Atypical Lobular Hyperplasia). There was no invasive cancer or cancer in situ and margins were negative.Pathology accession no is [id number]. She is now referred to high risk breast clinic to discuss chemoprevention strategies.she is up to date with her pap smear, colonoscopy and bone density.  In terms of breast cancer risk profile:  She menarched at early age of 65 and went to menopause at age 64 (TAH due  to fibroids). She had 2 pregnancies resulting in miscarriagesy, no biological children, 1 adopted boy.  She did not breast-fed.  She received birth control pills for couple of years and also had IUD placed.  She was never exposed to fertility medications but took HRT for few years.  She has + family history of Breast cancer. 2 maternal cousins developed breast cancer (age 60's and 46's) and her maternal aunt had breast cancer also. 1 brother with prostate cancer. 1 sister healthy as a horse. Father had prostate cancer and stomach cancer. Adopted boy is 59 years old. Mother alive at age 63 with early dementia.No ovarian cancer in family.  PREVIOUS THERAPY: She tried anastrozole in 11/2014 and Tamoxifen in 03/2015 for about one month each, and stopped due to side effects.   CURRENT THERAPY:  observation  INTERIM HISTORY: She returns for follow up.  She stopped tamoxifen after her office visit in September 2016  Due to the side effects especially muscular and joint  Discomfort.   Her symptoms slowly improved, she mild residual leg pain.  Her brother recently died from prostate cancer. She is very concerned about her  Risk  Of breast cancer. She is scheduled to see genetic  Counselor today.   MEDICAL HISTORY:  Past Medical History  Diagnosis Date  . HTN (hypertension)   . Hyperlipidemia   . TIA (transient ischemic attack)     also Hx of it.   Marland Kitchen GERD (gastroesophageal reflux disease)   . Headache(784.0)   . Anemia, unspecified   . Edema   . Depressive disorder, not elsewhere classified   . Low back pain   . Obesity   .  Duodenal nodule   . Hemorrhoids   . Arthritis   . Glaucoma     narrow angle, sees Dr. Herbert Deaner   . Type II or unspecified type diabetes mellitus without mention of complication, not stated as uncontrolled     sees Dr. Carrolyn Meiers   . Gastric polyps     COLON  . Allergy   . CAD (coronary artery disease)     sees. Dr. Burt Knack. s/p cath with PCI of RCA (xience des) June 2010.  Pt also with LAD and D2 dzs-NL FFR in both areas med Rx  . Peripheral vascular disease ([place])   . Stroke [facility])     TIA  "many yrs ago"    . HSV (herpes simplex virus) anogenital infection 05/2014  . Cataract     sees Dr. Herbert Deaner   . Abnormal nuclear stress test 12/29/2014  . Carotid artery occlusion   . Family history of breast cancer   . Family history of prostate cancer   . Family history of stomach cancer     SURGICAL HISTORY: Past Surgical History  Procedure Laterality Date  . Bilateral carpal tunnel release    . Colonoscopy  05-29-11    per Dr. Henrene Pastor, benign polyps, repeat in 5 yrs    . Esophagogastroduodenoscopy  02-08-06    per Dr. Henrene Pastor, gastritis   . Glaucoma surgery      with laser, per Dr. Henrene Pastor   . Carotid endarterectomy  march 2012    per Dr. Morton Amy  . Abdominal hysterectomy  age 25    TAH and USO  Leiomyomata  . Back surgery  2008    lumb fusion  . Cholecystectomy    . Dilation and curettage of uterus    . Breast biopsy Left 01/26/2014    Procedure: EXCISION LEFT BREAST MASS;  Surgeon: Adin Hector, MD;  Location: [place];  Service: General;  Laterality: Left;  . Cardiac catheterization    . Carpal tunnel release Left 05/14/2014    Procedure: Left Carpal tunnel release;  Surgeon: Ashok Pall, MD;  Location: [place] NEURO ORS;  Service: Neurosurgery;  Laterality: Left;  Left Carpal tunnel release  . Ulnar nerve transposition Left 05/14/2014    Procedure: Left Ulnar nerve decompression;  Surgeon: Ashok Pall, MD;  Location: [place] NEURO ORS;  Service: Neurosurgery;  Laterality: Left;  Left Ulnar nerve decompression  . Anterior cervical corpectomy N/A 06/11/2014    Procedure: ANTERIOR CERVICAL CORPECTOMY CERVICAL SIX; WITH CERVICAL FIVE TO CERVICAL SEVEN ARTHRODESIS;  Surgeon: Ashok Pall, MD;  Location: [facility] NEURO ORS;  Service: Neurosurgery;  Laterality: N/A;  . Cardiac catheterization N/A 12/29/2014    Procedure: Left Heart Cath and Coronary  Angiography;  Surgeon: Sherren Mocha, MD;  Location: [facility] CV LAB;  Service: Cardiovascular;  Laterality: N/A;  . Eye surgery Left June 2016    Cataract    SOCIAL HISTORY: Social History   Social History  . Marital Status: Married    Spouse Name: N/A  . Number of Children: 1  . Years of Education: N/A   Occupational History  . Not on file.   Social History Main Topics  . Smoking status: Former Smoker    Quit date: 01/22/1967  . Smokeless tobacco: Never Used     Comment: non smoker  . Alcohol Use: No  . Drug Use: No  . Sexual Activity: No     Comment: HYST-1st intercourse 22 yo-5 partners   Other Topics Concern  .  Not on file   Social History Narrative    FAMILY HISTORY: Family History  Problem Relation Age of Onset  . Coronary artery disease Neg Hx     premature CAD  . Colon cancer Neg Hx   . Esophageal cancer Neg Hx   . Hypertension Mother   . Hypertension Sister   . Hyperlipidemia Sister   . Hypertension Brother   . Cancer Brother     PROSTATE/LUNG  . Diabetes Brother   . Heart disease Brother     Before age 76 - Bypass  . Varicose Veins Brother   . Cancer Father     Prostate and pancreatic   . Stomach cancer Maternal Grandmother 55  . Heart disease Maternal Grandfather   . Breast cancer Maternal Aunt 20  . Cancer Maternal Aunt     cancer NOS  . Breast cancer Cousin 67    maternal first cousin  . Prostate cancer Cousin 69  . Breast cancer Cousin 47  . Breast cancer Cousin 40    maternal first cousin's daughter    ALLERGIES:  has No Known Allergies.  MEDICATIONS:  Current Outpatient Prescriptions  Medication Sig Dispense Refill  . acetaminophen (TYLENOL) 500 MG tablet Take 500 mg by mouth daily.    Marland Kitchen aspirin 81 MG tablet Take 81 mg by mouth daily.     . CRESTOR 40 MG tablet TAKE ONE TABLET BY MOUTH ONCE DAILY 30 tablet 3  . diclofenac sodium (VOLTAREN) 1 % GEL Apply 2 g topically 4 (four) times daily as needed (for pain).     Marland Kitchen  ergocalciferol (VITAMIN D2) 50000 UNITS capsule Take 50,000 Units by mouth once a week. On Saturday.    . furosemide (LASIX) 40 MG tablet TAKE ONE TABLET BY MOUTH ONCE DAILY 30 tablet 0  . hydrALAZINE (APRESOLINE) 100 MG tablet Take 100 mg by mouth 3 (three) times daily.    . insulin aspart (NOVOLOG) 100 UNIT/ML injection Inject 10 Units into the skin 3 (three) times daily with meals.     . insulin glargine (LANTUS) 100 UNIT/ML injection Inject 30 Units into the skin daily.     . irbesartan (AVAPRO) 300 MG tablet TAKE ONE TABLET BY MOUTH ONCE DAILY 30 tablet 5  . isosorbide mononitrate (IMDUR) 60 MG 24 hr tablet Take 60 mg by mouth daily.    Marland Kitchen latanoprost (XALATAN) 0.005 % ophthalmic solution Place 1 drop into both eyes daily.     . methyldopa (ALDOMET) 250 MG tablet Take 1 tablet (250 mg total) by mouth 2 (two) times daily. 60 tablet 1  . metoprolol succinate (TOPROL-XL) 100 MG 24 hr tablet Take 1 tablet (100 mg total) by mouth daily. Take with or immediately following a meal. 30 tablet 11  . pantoprazole (PROTONIX) 40 MG tablet     . rosuvastatin (CRESTOR) 40 MG tablet Take 40 mg by mouth daily.    Marland Kitchen spironolactone (ALDACTONE) 25 MG tablet TAKE ONE TABLET BY MOUTH ONCE DAILY 30 tablet 5  . timolol (TIMOPTIC) 0.5 % ophthalmic solution Place 1 drop into the right eye daily.    . nitroGLYCERIN (NITROSTAT) 0.4 MG SL tablet Place 1 tablet (0.4 mg total) under the tongue every 5 (five) minutes as needed for chest pain. (Patient not taking: Reported on 08/24/2015) 25 tablet 1   No current facility-administered medications for this visit.    ROS   PHYSICAL EXAMINATION: ECOG PERFORMANCE STATUS: 1  Filed Vitals:   08/24/15 0928  BP: 162/51  Pulse: 66  Temp: 98.8 F (37.1 C)  Resp: 17   Filed Weights   08/24/15 0928  Weight: 199 lb 14.4 oz (90.674 kg)    Physical Exam  Constitutional: She appears well-developed and well-nourished.  HENT:  Head: Normocephalic and atraumatic.  Nose:  Nose normal.  Mouth/Throat: Oropharynx is clear and moist.  Eyes: Conjunctivae and EOM are normal. No scleral icterus.  Neck: Normal range of motion. Neck supple. No JVD present. No tracheal deviation present. No thyromegaly present.  Cardiovascular: Normal rate, regular rhythm and normal heart sounds.  Exam reveals no gallop and no friction rub.   No murmur heard. Pulmonary/Chest: Effort normal and breath sounds normal. No respiratory distress. She has no wheezes. She has no rales. Right breast exhibits no inverted nipple, no mass, no nipple discharge, no skin change and no tenderness. Left breast exhibits no inverted nipple, no mass, no nipple discharge, no skin change and no tenderness. Breasts are symmetrical. There is no breast swelling.  Abdominal: Soft. Bowel sounds are normal. She exhibits no distension. There is no tenderness. There is no rebound. No hernia.  Genitourinary: No breast bleeding.  Musculoskeletal: Normal range of motion. She exhibits no edema or tenderness.  Lymphadenopathy:    She has no cervical adenopathy.  Neurological: She is alert. She has normal reflexes. She displays normal reflexes. No cranial nerve deficit. She exhibits normal muscle tone. Coordination normal.  Skin: Skin is warm and dry. No rash noted. No erythema. No pallor.  Psychiatric: She has a normal mood and affect. Her behavior is normal. Judgment and thought content normal.  left breast (+) small lumps below the surgical scar, likely scar tissue, no other palpable breast mass or axillary nodes.   LABORATORY DATA:  CBC Latest Ref Rng 08/24/2015 03/29/2015 03/03/2015  WBC 3.9 - 10.3 10e3/uL 8.4 16.6(H) 7.7  Hemoglobin 11.6 - 15.9 g/dL 12.8 12.6 11.9  Hematocrit 34.8 - 46.6 % 40.0 39.2 37.2  Platelets 145 - 400 10e3/uL 194 204 201    CMP Latest Ref Rng 08/24/2015 03/29/2015 03/03/2015  Glucose 70 - 140 mg/dl 243(H) 297(H) 186(H)  BUN 7.0 - 26.0 mg/dL 15.8 24.2 18.3  Creatinine 0.6 - 1.1 mg/dL 1.1 1.1 0.9    Sodium 136 - 145 mEq/L 141 141 143  Potassium 3.5 - 5.1 mEq/L 3.7 4.1 3.8  Chloride 96 - 112 mEq/L - - -  CO2 22 - 29 mEq/L 29 25 27   Calcium 8.4 - 10.4 mg/dL 9.6 9.7 9.3  Total Protein 6.4 - 8.3 g/dL 7.2 7.4 6.6  Total Bilirubin 0.20 - 1.20 mg/dL 0.65 0.64 0.47  Alkaline Phos 40 - 150 U/L 84 80 74  AST 5 - 34 U/L 21 20 17   ALT 0 - 55 U/L 19 17 16    Iron and TIBC [facility] (Order [id number])      Iron and TIBC [facility]  Status: Finalresult Visible to patient:  MyChart Nextappt: Today at 11:00 AM in Oncology ([facility] B[facility]) Dx:  Anemia, unspecified anemia type           Ref Range 40mo ago  7mo ago  62mo ago     Iron 41 - 142 ug/dL 94 177 (H) 25 (L)    TIBC 236 - 444 ug/dL 350 328 380    UIBC 120 - 384 ug/dL 256 150 355    %SAT 21 - 57 % 27 54 7 (L)        Ferritin (Order [id number])      Ferritin  Status: Finalresult Visible to patient:  MyChart Nextappt: Today at 11:00 AM in Oncology ([facility] B[facility]) Dx:  Anemia, unspecified anemia type           Ref Range 63mo ago  76mo ago  59mo ago     Ferritin 9 - 269 ng/ml 27 22 11           RADIOGRAPHIC STUDIES: I have personally reviewed the radiological images as listed and agreed with the findings in the report.   ASSESSMENT:   70 years old female with diagnosis of ALH (Atypical Lobular Hyperplasia) seen on a Lumpectomy of left breast. This was done for a palpable lump which was negative on mammogram and ultrasound. She had previously a right breast biopsy which was also negative for malignancy.   1. Left breast ALH  -Dr. Lona Kettle has previously extensively discussed the risks of developing breast cancer and chemoprevention with tamoxifen versus anastrozole. The overall benefit of chemoprevention is reduce her breast cancer by 50% with absolute benefit 3.5-4% risk reduction. She agreed to take chemoprevention  - she has tried anastrozole and tamoxifen but could not tolerate due to side  effects. -we discussed  alternative chemopevention with  Raloxifene, written material was give to her today for her to review.  -continue annual screening mammogram, her last one in May 2016 was in normal. - due to her dense breast tissue , I also recommend breast MRI once a year, 6 month apart form mammo  -She has strong family history of breast cancer (maternal aunt and her daughter and granddaughter),  and prostate cancer in brother and father, she is interested in genetic counseling, she is going to see genetic counselor today.  2. Iron deficient anemia -she has developed moderate microcytic anemia in the past 3-4 month, ferritin 11, low iron and sat, supports iron deficient anemia. Possible related to her prior surgery. -continue ferrous sulfate 325 mg 2-3 times a day. -Her hemoglobin has normalized, serum iron and ferritin level also normalized, I encouraged her to continue oral iron pill. -She had her last colonoscopy in 2012, polyps was removed, I encouraged her to follow-up with her GI  4. HTN, DM, CAD, history of TIA -She will continue follow-up with her primary care physician  Plan -she will think about Raloxifen  -Genetic counselor today -mammogram in 11/2015, breast MRI IN 05/2016 -Return to clinic in 6 month with lab   Truitt Merle 08/24/2015

## 2015-08-25 ENCOUNTER — Other Ambulatory Visit: Payer: Medicare Other

## 2015-08-25 ENCOUNTER — Ambulatory Visit: Payer: Medicare Other | Admitting: Hematology

## 2015-09-07 ENCOUNTER — Telehealth: Payer: Self-pay | Admitting: *Deleted

## 2015-09-07 NOTE — Telephone Encounter (Signed)
"  I received a MyChart e-mail and having trouble opening it to read."   Advised she call or e-mail the MyChart support.  Shared 08-30-2015 labs that were released and next appointment information with this call.  Labs WNL except glucose elevated.  Suggested she limit carbs to no more than three per meal or follow endricrinologist advice for meals.

## 2015-09-14 ENCOUNTER — Encounter: Payer: Self-pay | Admitting: Genetic Counselor

## 2015-09-14 ENCOUNTER — Telehealth: Payer: Self-pay | Admitting: Genetic Counselor

## 2015-09-14 DIAGNOSIS — Z1379 Encounter for other screening for genetic and chromosomal anomalies: Secondary | ICD-10-CM | POA: Insufficient documentation

## 2015-09-14 NOTE — Telephone Encounter (Signed)
LM on VM with good news.  ASked that she CB. 

## 2015-09-16 ENCOUNTER — Telehealth: Payer: Self-pay | Admitting: Genetic Counselor

## 2015-09-16 ENCOUNTER — Ambulatory Visit: Payer: Self-pay | Admitting: Genetic Counselor

## 2015-09-16 DIAGNOSIS — Z1379 Encounter for other screening for genetic and chromosomal anomalies: Secondary | ICD-10-CM

## 2015-09-16 DIAGNOSIS — N6092 Unspecified benign mammary dysplasia of left breast: Secondary | ICD-10-CM

## 2015-09-16 DIAGNOSIS — Z800 Family history of malignant neoplasm of digestive organs: Secondary | ICD-10-CM

## 2015-09-16 DIAGNOSIS — Z8042 Family history of malignant neoplasm of prostate: Secondary | ICD-10-CM

## 2015-09-16 DIAGNOSIS — Z803 Family history of malignant neoplasm of breast: Secondary | ICD-10-CM

## 2015-09-16 NOTE — Telephone Encounter (Signed)
Revealed that testing found an MSH6 VUS.  This does not change clinical management.  We treat this as a negative test. Patient was reassured.

## 2015-09-16 NOTE — Progress Notes (Signed)
HPI: [name] was previously seen in the [facility] clinic due to a family history of cancer and concerns regarding a hereditary predisposition to cancer. Please refer to our prior cancer genetics clinic note for more information regarding [name]'s medical, social and family histories, and our assessment and recommendations, at the time. [name] recent genetic test results were disclosed to her, as were recommendations warranted by these results. These results and recommendations are discussed in more detail below.  FAMILY HISTORY:  We obtained a detailed, 4-generation family history.  Significant diagnoses are listed below: Family History  Problem Relation Age of Onset  . Coronary artery disease Neg Hx     premature CAD  . Colon cancer Neg Hx   . Esophageal cancer Neg Hx   . Hypertension Mother   . Hypertension Sister   . Hyperlipidemia Sister   . Hypertension Brother   . Cancer Brother     PROSTATE/LUNG  . Diabetes Brother   . Heart disease Brother     Before age 59 - Bypass  . Varicose Veins Brother   . Cancer Father     Prostate and pancreatic   . Stomach cancer Maternal Grandmother 63  . Heart disease Maternal Grandfather   . Breast cancer Maternal Aunt 37  . Cancer Maternal Aunt     cancer NOS  . Breast cancer Cousin 66    maternal first cousin  . Prostate cancer Cousin 30  . Breast cancer Cousin 42  . Breast cancer Cousin 31    maternal first cousin's daughter    The patient has an adopted son who is the brother of her brother's adopted son. She has one brother and one sister, her brother was diagnosed with prostate cancer at 52 and died at 4. Her father died of prostate cancer at 10. He did not have a family history of cancer. Her mother is alive at 93. She had six siblings. One sister had breast cancer at 83, and had a daughter who had breast cancer in her 49s and a granddaughter who had breast cancer around 92. Another sister  had an unknown cancer and had a son with prostate cancer. Lastly, a brother had a daughter with breast cancer. [name] maternal grandmother had stomach cancer at 2. Patient's maternal ancestors are of [place] American and Native American descent, and paternal ancestors are of [place] American nad Native American descent. There is no reported Ashkenazi Jewish ancestry. There is no known consanguinity.  GENETIC TEST RESULTS: At the time of [name]'s visit, we recommended she pursue genetic testing of the Breast/Ovarian cancer gene panel. The Breast/Ovarian gene panel offered by [facility] includes sequencing and rearrangement analysis for the following 20 genes:  ATM, BARD1, BRCA1, BRCA2, BRIP1, CDH1, CHEK2, EPCAM, FANCC, MLH1, MSH2, MSH6, NBN, PALB2, PMS2, PTEN, RAD51C, RAD51D, TP53, and XRCC2.   The report date is September 12, 2015.  Genetic testing was normal, and did not reveal a deleterious mutation in these genes. The test report has been scanned into EPIC and is located under the Molecular Pathology section of the Results Review tab.   We discussed with [name] that since the current genetic testing is not perfect, it is possible there may be a gene mutation in one of these genes that current testing cannot detect, but that chance is small. We also discussed, that it is possible that another gene that has not yet been discovered, or that we have not yet tested, is responsible  for the cancer diagnoses in the family, and it is, therefore, important to remain in touch with cancer genetics in the future so that we can continue to offer [name] the most up to date genetic testing.   Genetic testing did detect a Variant of Unknown Significance in the MSH2 gene called c.16A>C. At this time, it is unknown if this variant is associated with increased cancer risk or if this is a normal finding, but most variants such as this get reclassified to being inconsequential. It should not be used to  make medical management decisions. With time, we suspect the lab will determine the significance of this variant, if any. If we do learn more about it, we will try to contact [name] to discuss it further. However, it is important to stay in touch with [place] periodically and keep the address and phone number up to date.   CANCER SCREENING RECOMMENDATIONS: This result is reassuring and indicates that [name] likely does not have an increased risk for a future cancer due to a mutation in one of these genes. This normal test also suggests that [name]'s cancer was most likely not due to an inherited predisposition associated with one of these genes.  Most cancers happen by chance and this negative test suggests that her cancer falls into this category.  We, therefore, recommended she continue to follow the cancer management and screening guidelines provided by her oncology and primary healthcare provider.   RECOMMENDATIONS FOR FAMILY MEMBERS: Women in this family might be at some increased risk of developing cancer, over the general population risk, simply due to the family history of cancer. We recommended women in this family have a yearly mammogram beginning at age 71, or 71 years younger than the earliest onset of cancer, an an annual clinical breast exam, and perform monthly breast self-exams. Women in this family should also have a gynecological exam as recommended by their primary provider. All family members should have a colonoscopy by age 89.  FOLLOW-UP: Lastly, we discussed with [name] that cancer genetics is a rapidly advancing field and it is possible that new genetic tests will be appropriate for her and/or her family members in the future. We encouraged her to remain in contact with cancer genetics on an annual basis so we can update her personal and family histories and let her know of advances in cancer genetics that may benefit this family.   Our contact number was  provided. [name] questions were answered to her satisfaction, and she knows she is welcome to call us at anytime with additional questions or concerns.   Karen Kayser, MS, [facility] Certified Genetic Counselor [name]

## 2015-09-20 DIAGNOSIS — E119 Type 2 diabetes mellitus without complications: Secondary | ICD-10-CM | POA: Diagnosis not present

## 2015-09-20 DIAGNOSIS — H26491 Other secondary cataract, right eye: Secondary | ICD-10-CM | POA: Diagnosis not present

## 2015-09-20 DIAGNOSIS — H402223 Chronic angle-closure glaucoma, left eye, severe stage: Secondary | ICD-10-CM | POA: Diagnosis not present

## 2015-09-20 DIAGNOSIS — H402213 Chronic angle-closure glaucoma, right eye, severe stage: Secondary | ICD-10-CM | POA: Diagnosis not present

## 2015-09-20 LAB — HM DIABETES EYE EXAM

## 2015-10-04 DIAGNOSIS — I2510 Atherosclerotic heart disease of native coronary artery without angina pectoris: Secondary | ICD-10-CM | POA: Diagnosis not present

## 2015-10-04 DIAGNOSIS — I6523 Occlusion and stenosis of bilateral carotid arteries: Secondary | ICD-10-CM | POA: Diagnosis not present

## 2015-10-04 DIAGNOSIS — K635 Polyp of colon: Secondary | ICD-10-CM | POA: Diagnosis not present

## 2015-10-04 DIAGNOSIS — N62 Hypertrophy of breast: Secondary | ICD-10-CM | POA: Diagnosis not present

## 2015-10-04 DIAGNOSIS — I10 Essential (primary) hypertension: Secondary | ICD-10-CM | POA: Diagnosis not present

## 2015-10-04 DIAGNOSIS — M4806 Spinal stenosis, lumbar region: Secondary | ICD-10-CM | POA: Diagnosis not present

## 2015-10-04 DIAGNOSIS — Z6833 Body mass index (BMI) 33.0-33.9, adult: Secondary | ICD-10-CM | POA: Diagnosis not present

## 2015-10-04 DIAGNOSIS — E1159 Type 2 diabetes mellitus with other circulatory complications: Secondary | ICD-10-CM | POA: Diagnosis not present

## 2015-10-04 DIAGNOSIS — E784 Other hyperlipidemia: Secondary | ICD-10-CM | POA: Diagnosis not present

## 2015-10-04 DIAGNOSIS — D6489 Other specified anemias: Secondary | ICD-10-CM | POA: Diagnosis not present

## 2015-10-04 DIAGNOSIS — Z1389 Encounter for screening for other disorder: Secondary | ICD-10-CM | POA: Diagnosis not present

## 2015-10-13 ENCOUNTER — Other Ambulatory Visit: Payer: Self-pay | Admitting: Cardiovascular Disease

## 2015-10-13 ENCOUNTER — Other Ambulatory Visit: Payer: Self-pay | Admitting: Internal Medicine

## 2015-10-13 NOTE — Telephone Encounter (Signed)
Liliane Shi, PA-C at 03/30/2015 2:14 PM  CRESTOR 40 MG tabletTAKE ONE TABLET BY MOUTH ONCE DAILY ASSESSMENT AND PLAN:  1. Coronary artery disease: Recent LHC with stable anatomy and patent RCA stent. Continue beta-blocker, ASA, nitrates, statin. No angina.

## 2015-10-29 ENCOUNTER — Other Ambulatory Visit: Payer: Self-pay | Admitting: Internal Medicine

## 2015-11-03 DIAGNOSIS — I10 Essential (primary) hypertension: Secondary | ICD-10-CM | POA: Diagnosis not present

## 2015-11-03 DIAGNOSIS — E1159 Type 2 diabetes mellitus with other circulatory complications: Secondary | ICD-10-CM | POA: Diagnosis not present

## 2015-11-03 DIAGNOSIS — Z6833 Body mass index (BMI) 33.0-33.9, adult: Secondary | ICD-10-CM | POA: Diagnosis not present

## 2015-11-28 DIAGNOSIS — M542 Cervicalgia: Secondary | ICD-10-CM | POA: Diagnosis not present

## 2015-11-29 DIAGNOSIS — I10 Essential (primary) hypertension: Secondary | ICD-10-CM | POA: Diagnosis not present

## 2015-11-29 DIAGNOSIS — M4722 Other spondylosis with radiculopathy, cervical region: Secondary | ICD-10-CM | POA: Diagnosis not present

## 2015-11-29 DIAGNOSIS — M4712 Other spondylosis with myelopathy, cervical region: Secondary | ICD-10-CM | POA: Diagnosis not present

## 2015-11-29 DIAGNOSIS — Z6834 Body mass index (BMI) 34.0-34.9, adult: Secondary | ICD-10-CM | POA: Diagnosis not present

## 2015-11-30 ENCOUNTER — Other Ambulatory Visit: Payer: Self-pay | Admitting: Neurosurgery

## 2015-11-30 DIAGNOSIS — M47812 Spondylosis without myelopathy or radiculopathy, cervical region: Secondary | ICD-10-CM

## 2015-12-01 ENCOUNTER — Ambulatory Visit
Admission: RE | Admit: 2015-12-01 | Discharge: 2015-12-01 | Disposition: A | Discharge status: Home / Self Care | Payer: Medicare Other | Source: Ambulatory Visit | Attending: Neurosurgery | Admitting: Neurosurgery

## 2015-12-01 DIAGNOSIS — M47812 Spondylosis without myelopathy or radiculopathy, cervical region: Secondary | ICD-10-CM | POA: Diagnosis not present

## 2015-12-08 ENCOUNTER — Other Ambulatory Visit: Payer: Medicare Other

## 2015-12-08 DIAGNOSIS — M4712 Other spondylosis with myelopathy, cervical region: Secondary | ICD-10-CM | POA: Diagnosis not present

## 2015-12-08 DIAGNOSIS — Z6835 Body mass index (BMI) 35.0-35.9, adult: Secondary | ICD-10-CM | POA: Diagnosis not present

## 2015-12-08 DIAGNOSIS — G5622 Lesion of ulnar nerve, left upper limb: Secondary | ICD-10-CM | POA: Diagnosis not present

## 2015-12-08 DIAGNOSIS — G5602 Carpal tunnel syndrome, left upper limb: Secondary | ICD-10-CM | POA: Diagnosis not present

## 2015-12-11 ENCOUNTER — Other Ambulatory Visit: Payer: Self-pay | Admitting: Cardiovascular Disease

## 2015-12-13 DIAGNOSIS — G5622 Lesion of ulnar nerve, left upper limb: Secondary | ICD-10-CM | POA: Diagnosis not present

## 2015-12-13 DIAGNOSIS — G5602 Carpal tunnel syndrome, left upper limb: Secondary | ICD-10-CM | POA: Diagnosis not present

## 2015-12-15 DIAGNOSIS — Z6834 Body mass index (BMI) 34.0-34.9, adult: Secondary | ICD-10-CM | POA: Diagnosis not present

## 2015-12-15 DIAGNOSIS — I10 Essential (primary) hypertension: Secondary | ICD-10-CM | POA: Diagnosis not present

## 2015-12-15 DIAGNOSIS — E1159 Type 2 diabetes mellitus with other circulatory complications: Secondary | ICD-10-CM | POA: Diagnosis not present

## 2015-12-27 DIAGNOSIS — I10 Essential (primary) hypertension: Secondary | ICD-10-CM | POA: Diagnosis not present

## 2015-12-27 DIAGNOSIS — M4806 Spinal stenosis, lumbar region: Secondary | ICD-10-CM | POA: Diagnosis not present

## 2015-12-27 DIAGNOSIS — H26492 Other secondary cataract, left eye: Secondary | ICD-10-CM | POA: Diagnosis not present

## 2015-12-27 DIAGNOSIS — Z6835 Body mass index (BMI) 35.0-35.9, adult: Secondary | ICD-10-CM | POA: Diagnosis not present

## 2015-12-27 DIAGNOSIS — H26491 Other secondary cataract, right eye: Secondary | ICD-10-CM | POA: Diagnosis not present

## 2015-12-27 DIAGNOSIS — H402223 Chronic angle-closure glaucoma, left eye, severe stage: Secondary | ICD-10-CM | POA: Diagnosis not present

## 2015-12-27 DIAGNOSIS — H402213 Chronic angle-closure glaucoma, right eye, severe stage: Secondary | ICD-10-CM | POA: Diagnosis not present

## 2015-12-27 DIAGNOSIS — G5622 Lesion of ulnar nerve, left upper limb: Secondary | ICD-10-CM | POA: Diagnosis not present

## 2016-01-02 ENCOUNTER — Encounter: Payer: Medicare Other | Admitting: Gynecology

## 2016-01-02 DIAGNOSIS — H402213 Chronic angle-closure glaucoma, right eye, severe stage: Secondary | ICD-10-CM | POA: Diagnosis not present

## 2016-01-02 DIAGNOSIS — H402223 Chronic angle-closure glaucoma, left eye, severe stage: Secondary | ICD-10-CM | POA: Diagnosis not present

## 2016-01-02 DIAGNOSIS — H26491 Other secondary cataract, right eye: Secondary | ICD-10-CM | POA: Diagnosis not present

## 2016-01-02 DIAGNOSIS — H26492 Other secondary cataract, left eye: Secondary | ICD-10-CM | POA: Diagnosis not present

## 2016-01-03 ENCOUNTER — Other Ambulatory Visit: Payer: Self-pay | Admitting: Neurosurgery

## 2016-01-03 DIAGNOSIS — M48061 Spinal stenosis, lumbar region without neurogenic claudication: Secondary | ICD-10-CM

## 2016-01-05 ENCOUNTER — Ambulatory Visit (INDEPENDENT_AMBULATORY_CARE_PROVIDER_SITE_OTHER): Payer: Medicare Other | Admitting: Gynecology

## 2016-01-05 ENCOUNTER — Encounter: Payer: Self-pay | Admitting: Gynecology

## 2016-01-05 VITALS — BP 124/80 | Ht 64.0 in | Wt 199.0 lb

## 2016-01-05 DIAGNOSIS — Z01419 Encounter for gynecological examination (general) (routine) without abnormal findings: Secondary | ICD-10-CM

## 2016-01-05 DIAGNOSIS — A609 Anogenital herpesviral infection, unspecified: Secondary | ICD-10-CM | POA: Diagnosis not present

## 2016-01-05 DIAGNOSIS — N952 Postmenopausal atrophic vaginitis: Secondary | ICD-10-CM

## 2016-01-05 DIAGNOSIS — L723 Sebaceous cyst: Secondary | ICD-10-CM

## 2016-01-05 DIAGNOSIS — N941 Unspecified dyspareunia: Secondary | ICD-10-CM | POA: Diagnosis not present

## 2016-01-05 MED ORDER — VALACYCLOVIR HCL 500 MG PO TABS: 500.0000 mg | ORAL_TABLET | Freq: Two times a day (BID) | ORAL | Status: DC

## 2016-01-05 NOTE — Patient Instructions (Signed)
Start the Valtrex herpes medication twice daily at the earliest sign of outbreak for 5 days. Try the vaginal lubricants as we discussed.  Schedule your colonoscopy with:  [facility]   Address: [address], [place], [place] [address]  [phone]

## 2016-01-05 NOTE — Progress Notes (Signed)
[name] [birth year] [id number]        70 y.o.  G2P1011  for breast and pelvic exam. Several issues noted below.  Past medical history,surgical history, problem list, medications, allergies, family history and social history were all reviewed and documented as reviewed in the EPIC chart.  ROS:  Performed with pertinent positives and negatives included in the history, assessment and plan.   Additional significant findings :  Vaginal dryness and herpes as discussed below   Exam: [name] assistant Filed Vitals:   01/05/16 0839  BP: 124/80  Height: 5\' 4"  (1.626 m)  Weight: 199 lb (90.266 kg)   General appearance:  Normal affect, orientation and appearance. Skin: Grossly normal HEENT: Without gross lesions.  No cervical or supraclavicular adenopathy. Thyroid normal.  Lungs:  Clear without wheezing, rales or rhonchi Cardiac: RR, without RMG Abdominal:  Soft, nontender, without masses, guarding, rebound, organomegaly or hernia Breasts:  Examined lying and sitting without masses, retractions, discharge or axillary adenopathy.  Well-healed left breast biopsy site at 12:00 to think breasts off areola. Pelvic:  Ext/BUS/vagina with atrophic changes. Small classic sebaceous cyst right mid labia majora  Adnexa without masses or tenderness    Anus and perineum normal   Rectovaginal normal sphincter tone without palpated masses or tenderness.    Assessment/Plan:  70 y.o. G80P1011 female for breast and pelvic exam.   1. Postmenopausal/atrophic genital changes/dyspareunia. Status post TVH USO questionable BSO for leiomyoma. Patient notes she is having more difficulties with painful intercourse. Not having significant vaginal dryness daily. I reviewed options with her and discussed the difference between vaginal moisturizers and vaginal lubricants. Water-based versus oil-based lubrication discussed.  I also reviewed vaginal estrogen supplementation and the various forms. She does have a  history of atypical lobular hyperplasia. Had tried Arimidex and tamoxifen but could not tolerate the side effects. Issues of systemic absorption and potential risks with this and her history also discussed. At this point the patient is going to try OTC lubricants and will follow up if it continues or worsens. 2. History of genital HSV. Patient had questions about HSV as far as transmission risks and treatment. She has occasional outbreaks. I reviewed the risk of transmission particularly during an outbreak but also possible when she does not have a clinical outbreak. Continuous suppression versus intermittent outbreak treatment discussed. At this point she wants intermittent outbreak treatment. Valtrex 500 mg twice a day 5 days at the earliest onset of symptoms. #10 with 5 refills provided. 3. DEXA 2013 normal. Plan repeat DEXA at age 23. 28. Colonoscopy 2012. Patient relates she was told to repeat at 5 years. She's going to call [facility] to schedule this as this is where her last colonoscopy was done. 5. Pap smear 2011. No Pap smear done today. No history of abnormal Pap smears. Status post hysterectomy for benign indications. Over the age of 56. Per current screening guidelines we both agree to stop screening. 6. Mammography scheduled as it is due now and she's going to follow up for this. Importance of annual mammography given her history of atypical lobular hyperplasia stressed. SBE monthly reviewed. 7. Health maintenance. No routine lab work done as this is done at her primary physician's office.   Greater than 15 minutes of my time in excess of her breast and pelvic exam was spent in direct face to face counseling and coordination of care in regards to her problems of genital HSV and atrophic vaginitis causing dyspareunia.   Cheryn Lundquist P  MD, 9:19 AM 01/05/2016

## 2016-01-19 ENCOUNTER — Ambulatory Visit
Admission: RE | Admit: 2016-01-19 | Discharge: 2016-01-19 | Disposition: A | Discharge status: Home / Self Care | Payer: Medicare Other | Source: Ambulatory Visit | Attending: Neurosurgery | Admitting: Neurosurgery

## 2016-01-19 DIAGNOSIS — M48061 Spinal stenosis, lumbar region without neurogenic claudication: Secondary | ICD-10-CM

## 2016-01-22 ENCOUNTER — Other Ambulatory Visit: Payer: Self-pay | Admitting: Cardiovascular Disease

## 2016-01-26 DIAGNOSIS — M170 Bilateral primary osteoarthritis of knee: Secondary | ICD-10-CM | POA: Diagnosis not present

## 2016-01-31 ENCOUNTER — Other Ambulatory Visit: Payer: Medicare Other

## 2016-02-02 DIAGNOSIS — M170 Bilateral primary osteoarthritis of knee: Secondary | ICD-10-CM | POA: Diagnosis not present

## 2016-02-07 DIAGNOSIS — M170 Bilateral primary osteoarthritis of knee: Secondary | ICD-10-CM | POA: Diagnosis not present

## 2016-02-08 DIAGNOSIS — E559 Vitamin D deficiency, unspecified: Secondary | ICD-10-CM | POA: Diagnosis not present

## 2016-02-08 DIAGNOSIS — N62 Hypertrophy of breast: Secondary | ICD-10-CM | POA: Diagnosis not present

## 2016-02-08 DIAGNOSIS — I2510 Atherosclerotic heart disease of native coronary artery without angina pectoris: Secondary | ICD-10-CM | POA: Diagnosis not present

## 2016-02-08 DIAGNOSIS — E784 Other hyperlipidemia: Secondary | ICD-10-CM | POA: Diagnosis not present

## 2016-02-08 DIAGNOSIS — Z6833 Body mass index (BMI) 33.0-33.9, adult: Secondary | ICD-10-CM | POA: Diagnosis not present

## 2016-02-08 DIAGNOSIS — I6523 Occlusion and stenosis of bilateral carotid arteries: Secondary | ICD-10-CM | POA: Diagnosis not present

## 2016-02-08 DIAGNOSIS — I10 Essential (primary) hypertension: Secondary | ICD-10-CM | POA: Diagnosis not present

## 2016-02-08 DIAGNOSIS — M4806 Spinal stenosis, lumbar region: Secondary | ICD-10-CM | POA: Diagnosis not present

## 2016-02-08 DIAGNOSIS — E1159 Type 2 diabetes mellitus with other circulatory complications: Secondary | ICD-10-CM | POA: Diagnosis not present

## 2016-02-08 DIAGNOSIS — K635 Polyp of colon: Secondary | ICD-10-CM | POA: Diagnosis not present

## 2016-02-08 DIAGNOSIS — D6489 Other specified anemias: Secondary | ICD-10-CM | POA: Diagnosis not present

## 2016-02-09 ENCOUNTER — Ambulatory Visit (INDEPENDENT_AMBULATORY_CARE_PROVIDER_SITE_OTHER): Payer: Medicare Other

## 2016-02-09 ENCOUNTER — Ambulatory Visit (INDEPENDENT_AMBULATORY_CARE_PROVIDER_SITE_OTHER): Payer: Medicare Other | Admitting: Podiatry

## 2016-02-09 ENCOUNTER — Encounter: Payer: Self-pay | Admitting: Podiatry

## 2016-02-09 VITALS — BP 115/56 | HR 56 | Resp 16

## 2016-02-09 DIAGNOSIS — M79672 Pain in left foot: Secondary | ICD-10-CM | POA: Diagnosis not present

## 2016-02-09 DIAGNOSIS — M779 Enthesopathy, unspecified: Secondary | ICD-10-CM

## 2016-02-09 MED ORDER — TRIAMCINOLONE ACETONIDE 10 MG/ML IJ SUSP
10.0000 mg | Freq: Once | INTRAMUSCULAR | Status: AC
  Administered 2016-02-09: 10 mg

## 2016-02-09 NOTE — Progress Notes (Signed)
Subjective:     Patient ID: [name], female   DOB: August 08, 1945, 70 y.o.   MRN: [id number]  HPI patient presents stating I'm still having pain on the outside of my left foot with moderate improvement but still sore   Review of Systems     Objective:   Physical Exam Neurovascular status intact with patient found to have discomfort in the lateral side left ankle but improved for about 5 months and has now reoccurred    Assessment:     Tendinitis of the left lateral ankle    Plan:     H&P condition reviewed and I've recommended that we go ahead and reinject the tendon with the possibility for MRI if symptoms do not last 6 months pain-free. Patient will return when symptomatic

## 2016-02-13 ENCOUNTER — Ambulatory Visit
Admission: RE | Admit: 2016-02-13 | Discharge: 2016-02-13 | Disposition: A | Discharge status: Home / Self Care | Payer: Medicare Other | Source: Ambulatory Visit | Attending: Neurosurgery | Admitting: Neurosurgery

## 2016-02-13 DIAGNOSIS — M4806 Spinal stenosis, lumbar region: Secondary | ICD-10-CM | POA: Diagnosis not present

## 2016-02-13 MED ORDER — GADOBENATE DIMEGLUMINE 529 MG/ML IV SOLN
19.0000 mL | Freq: Once | INTRAVENOUS | Status: AC | PRN
  Administered 2016-02-13: 19 mL via INTRAVENOUS

## 2016-02-21 ENCOUNTER — Ambulatory Visit (HOSPITAL_BASED_OUTPATIENT_CLINIC_OR_DEPARTMENT_OTHER): Payer: Medicare Other | Admitting: Hematology

## 2016-02-21 ENCOUNTER — Telehealth: Payer: Self-pay | Admitting: Hematology

## 2016-02-21 ENCOUNTER — Encounter: Payer: Self-pay | Admitting: Hematology

## 2016-02-21 ENCOUNTER — Other Ambulatory Visit (HOSPITAL_BASED_OUTPATIENT_CLINIC_OR_DEPARTMENT_OTHER): Payer: Medicare Other

## 2016-02-21 VITALS — BP 186/65 | HR 62 | Temp 98.7°F | Resp 18 | Ht 64.0 in | Wt 200.4 lb

## 2016-02-21 DIAGNOSIS — D500 Iron deficiency anemia secondary to blood loss (chronic): Secondary | ICD-10-CM

## 2016-02-21 DIAGNOSIS — N62 Hypertrophy of breast: Secondary | ICD-10-CM

## 2016-02-21 DIAGNOSIS — I10 Essential (primary) hypertension: Secondary | ICD-10-CM

## 2016-02-21 DIAGNOSIS — D509 Iron deficiency anemia, unspecified: Secondary | ICD-10-CM

## 2016-02-21 DIAGNOSIS — N6092 Unspecified benign mammary dysplasia of left breast: Secondary | ICD-10-CM

## 2016-02-21 DIAGNOSIS — E119 Type 2 diabetes mellitus without complications: Secondary | ICD-10-CM | POA: Diagnosis not present

## 2016-02-21 LAB — COMPREHENSIVE METABOLIC PANEL
ALT: 19 U/L (ref 0–55)
ANION GAP: 10 meq/L (ref 3–11)
AST: 18 U/L (ref 5–34)
Albumin: 3.7 g/dL (ref 3.5–5.0)
Alkaline Phosphatase: 72 U/L (ref 40–150)
BILIRUBIN TOTAL: 0.56 mg/dL (ref 0.20–1.20)
BUN: 12 mg/dL (ref 7.0–26.0)
CHLORIDE: 106 meq/L (ref 98–109)
CO2: 28 meq/L (ref 22–29)
Calcium: 9.7 mg/dL (ref 8.4–10.4)
Creatinine: 0.9 mg/dL (ref 0.6–1.1)
EGFR: 74 mL/min/{1.73_m2} — AB (ref >90)
GLUCOSE: 112 mg/dL (ref 70–140)
POTASSIUM: 3.3 meq/L — AB (ref 3.5–5.1)
SODIUM: 144 meq/L (ref 136–145)
TOTAL PROTEIN: 6.8 g/dL (ref 6.4–8.3)

## 2016-02-21 LAB — CBC & DIFF AND RETIC
BASO%: 0.2 % (ref 0.0–2.0)
BASOS ABS: 0 10*3/uL (ref 0.0–0.1)
EOS ABS: 0.1 10*3/uL (ref 0.0–0.5)
EOS%: 0.6 % (ref 0.0–7.0)
HEMATOCRIT: 37.7 % (ref 34.8–46.6)
HEMOGLOBIN: 12.1 g/dL (ref 11.6–15.9)
IMMATURE RETIC FRACT: 11.2 % — AB (ref 1.60–10.00)
LYMPH%: 22.5 % (ref 14.0–49.7)
MCH: 28.3 pg (ref 25.1–34.0)
MCHC: 32.1 g/dL (ref 31.5–36.0)
MCV: 88.3 fL (ref 79.5–101.0)
MONO#: 0.7 10*3/uL (ref 0.1–0.9)
MONO%: 6.9 % (ref 0.0–14.0)
NEUT%: 69.8 % (ref 38.4–76.8)
NEUTROS ABS: 6.6 10*3/uL — AB (ref 1.5–6.5)
PLATELETS: 168 10*3/uL (ref 145–400)
RBC: 4.27 10*6/uL (ref 3.70–5.45)
RDW: 15.5 % — AB (ref 11.2–14.5)
Retic %: 1.55 % (ref 0.70–2.10)
Retic Ct Abs: 66.19 10*3/uL (ref 33.70–90.70)
WBC: 9.5 10*3/uL (ref 3.9–10.3)
lymph#: 2.1 10*3/uL (ref 0.9–3.3)

## 2016-02-21 MED ORDER — POTASSIUM CHLORIDE CRYS ER 20 MEQ PO TBCR
20.0000 meq | EXTENDED_RELEASE_TABLET | Freq: Every day | ORAL | 0 refills | Status: DC
Start: 2016-02-21 — End: 2016-08-01

## 2016-02-21 NOTE — Telephone Encounter (Signed)
Gave patient avs report and appointments for August 2018. Patient to call [facility] for [facility] and is aware central radiology scheduling will call re mri. Message t Dr. Burr Medico to add location for mri as [facility].

## 2016-02-21 NOTE — Progress Notes (Signed)
[facility] FOLLOW UP NOTE 02/21/2016   Patient Care Team: Laurey Morale, MD as PCP - [place], MD as Consulting Physician (Endocrinology) Sherren Mocha, MD as Consulting Physician (Cardiology) Ashok Pall, MD as Consulting Physician (Neurosurgery) Earlie Server, MD as Consulting Physician (Orthopedic Surgery) Fanny Skates, MD (Surgeon) Fontaine (Gynecologist)  CHIEF COMPLAINTS:  Follow up Atypical Lobular Hyperplasia [facility])  HISTORY OF INITIAL PRESENTING ILLNESS:   [name] 70 y.o. female from [place], [name] is here because of recent diagnosis of Left breast [place] which is a precursor lesion and imparts a higher future risk for developing invasive and non-invasive breast cancers in future. She also have several comorbid conditions like Diabetes, HTN, Chronic back pain, Coronary artery disease (1 stent), TIA and fibromyalgia.  She had history of a right breast biopsy on July 27,2010 consistent with benign stromal calcifications and no malignancy. She noticed a lump in left breast this year in May 2015 as she keeps her extra money in bra and she noticed a change in her breast when she was removing money. She underwent a 3D diagnostic mammogram and an ultrasound on 11/18/2013 read as benign and there was no sonographic evidence of malignancy. Patient saw Dr Dalbert Batman in surgery after being referred by Dr Phineas Real for this palpable lump with negative imaging studies.  She then had a lumpectomy done on 01/26/2014 and the pathology from that showed [place] (Atypical Lobular Hyperplasia). There was no invasive cancer or cancer in situ and margins were negative.Pathology accession no is [id number]. She is now referred to high risk breast clinic to discuss chemoprevention strategies.she is up to date with her pap smear, colonoscopy and bone density.  In terms of breast cancer risk profile:  She menarched at early age of 26 and went to menopause at age 75 (TAH due  to fibroids). She had 2 pregnancies resulting in miscarriagesy, no biological children, 1 adopted boy.  She did not breast-fed.  She received birth control pills for couple of years and also had IUD placed.  She was never exposed to fertility medications but took HRT for few years.  She has + family history of Breast cancer. 2 maternal cousins developed breast cancer (age 62's and 72's) and her maternal aunt had breast cancer also. 1 brother with prostate cancer. 1 sister healthy as a horse. Father had prostate cancer and stomach cancer. Adopted boy is 4 years old. Mother alive at age 40 with early dementia.No ovarian cancer in family.  PREVIOUS THERAPY: She tried anastrozole in 11/2014 and Tamoxifen in 03/2015 for about one month each, and stopped due to side effects.   CURRENT THERAPY:  observation  INTERIM HISTORY: She returns for follow up.  She is doing well overall. She denies any palpable breast mass, pain, or other new symptoms. She is overdue for her screening mammogram, and has not scheduled.  MEDICAL HISTORY:  Past Medical History:  Diagnosis Date  . Abnormal nuclear stress test 12/29/2014  . Allergy   . Anemia, unspecified   . Arthritis   . CAD (coronary artery disease)    sees. Dr. Burt Knack. s/p cath with PCI of RCA (xience des) June 2010. Pt also with LAD and D2 dzs-NL FFR in both areas med Rx  . Carotid artery occlusion   . Cataract    sees Dr. Herbert Deaner   . Depressive disorder, not elsewhere classified   . Duodenal nodule   . Edema   . Gastric polyps    COLON  .  GERD (gastroesophageal reflux disease)   . Glaucoma    narrow angle, sees Dr. Herbert Deaner   . Headache(784.0)   . Hemorrhoids   . HSV (herpes simplex virus) anogenital infection 05/2014  . HTN (hypertension)   . Hyperlipidemia   . Low back pain   . Obesity   . Peripheral vascular disease ([facility])   . Stroke [facility])    TIA  "many yrs ago"    . TIA (transient ischemic attack)    also Hx of it.   . Type II or  unspecified type diabetes mellitus without mention of complication, not stated as uncontrolled    sees Dr. Carrolyn Meiers     SURGICAL HISTORY: Past Surgical History:  Procedure Laterality Date  . ABDOMINAL HYSTERECTOMY  age 52   TAH and USO  Leiomyomata  . ANTERIOR CERVICAL CORPECTOMY N/A 06/11/2014   Procedure: ANTERIOR CERVICAL CORPECTOMY CERVICAL SIX; WITH CERVICAL FIVE TO CERVICAL SEVEN ARTHRODESIS;  Surgeon: Ashok Pall, MD;  Location: [facility];  Service: Neurosurgery;  Laterality: N/A;  . BACK SURGERY  2008   lumb fusion  . bilateral carpal tunnel release    . BREAST BIOPSY Left 01/26/2014   Procedure: EXCISION LEFT BREAST MASS;  Surgeon: Adin Hector, MD;  Location: [facility];  Service: General;  Laterality: Left;  . CARDIAC CATHETERIZATION    . CARDIAC CATHETERIZATION N/A 12/29/2014   Procedure: Left Heart Cath and Coronary Angiography;  Surgeon: Sherren Mocha, MD;  Location: [facility] CV LAB;  Service: Cardiovascular;  Laterality: N/A;  . CAROTID ENDARTERECTOMY  march 2012   per Dr. Morton Amy  . CARPAL TUNNEL RELEASE Left 05/14/2014   Procedure: Left Carpal tunnel release;  Surgeon: Ashok Pall, MD;  Location: [facility];  Service: Neurosurgery;  Laterality: Left;  Left Carpal tunnel release  . CHOLECYSTECTOMY    . COLONOSCOPY  05-29-11   per Dr. Henrene Pastor, benign polyps, repeat in 5 yrs    . DILATION AND CURETTAGE OF UTERUS    . ESOPHAGOGASTRODUODENOSCOPY  02-08-06   per Dr. Henrene Pastor, gastritis   . EYE SURGERY Left June 2016   Cataract  . GLAUCOMA SURGERY     with laser, per Dr. Henrene Pastor   . ULNAR NERVE TRANSPOSITION Left 05/14/2014   Procedure: Left Ulnar nerve decompression;  Surgeon: Ashok Pall, MD;  Location: [facility];  Service: Neurosurgery;  Laterality: Left;  Left Ulnar nerve decompression    SOCIAL HISTORY: Social History   Social History  . Marital status: Married    Spouse name: N/A  . Number of children: 1  . Years of  education: N/A   Occupational History  . Not on file.   Social History Main Topics  . Smoking status: Former Smoker    Quit date: 01/22/1967  . Smokeless tobacco: Never Used     Comment: non smoker  . Alcohol use No  . Drug use: No  . Sexual activity: No     Comment: HYST-1st intercourse 7 yo-5 partners   Other Topics Concern  . Not on file   Social History Narrative  . No narrative on file    FAMILY HISTORY: Family History  Problem Relation Age of Onset  . Hypertension Mother   . Hypertension Sister   . Hyperlipidemia Sister   . Hypertension Brother   . Cancer Brother     PROSTATE/LUNG  . Diabetes Brother   . Heart disease Brother     Before age 42 - Bypass  .  Varicose Veins Brother   . Cancer Father     Prostate and pancreatic   . Stomach cancer Maternal Grandmother 53  . Heart disease Maternal Grandfather   . Breast cancer Maternal Aunt 84  . Cancer Maternal Aunt     Stomach  . Breast cancer Cousin 10    maternal first cousin  . Prostate cancer Cousin 63  . Breast cancer Cousin 37  . Breast cancer Cousin 40    maternal first cousin's daughter  . Coronary artery disease Neg Hx     premature CAD  . Colon cancer Neg Hx   . Esophageal cancer Neg Hx     ALLERGIES:  has No Known Allergies.  MEDICATIONS:  Current Outpatient Prescriptions  Medication Sig Dispense Refill  . acetaminophen (TYLENOL) 500 MG tablet Take 500 mg by mouth daily.    Marland Kitchen aspirin 81 MG tablet Take 81 mg by mouth daily.     . CRESTOR 40 MG tablet TAKE ONE TABLET BY MOUTH ONCE DAILY 30 tablet 3  . diclofenac sodium (VOLTAREN) 1 % GEL Apply 2 g topically 4 (four) times daily as needed (for pain).     Marland Kitchen ergocalciferol (VITAMIN D2) 50000 UNITS capsule Take 50,000 Units by mouth once a week. On Saturday.    . furosemide (LASIX) 40 MG tablet TAKE ONE TABLET BY MOUTH ONCE DAILY 30 tablet 0  . hydrALAZINE (APRESOLINE) 100 MG tablet Take 100 mg by mouth 3 (three) times daily.    . insulin aspart  (NOVOLOG) 100 UNIT/ML injection Inject 10 Units into the skin 3 (three) times daily with meals.     . irbesartan (AVAPRO) 300 MG tablet TAKE ONE TABLET BY MOUTH ONCE DAILY 30 tablet 0  . isosorbide mononitrate (IMDUR) 60 MG 24 hr tablet Take 60 mg by mouth daily.    Marland Kitchen latanoprost (XALATAN) 0.005 % ophthalmic solution Place 1 drop into both eyes daily.     . methyldopa (ALDOMET) 250 MG tablet TAKE ONE TABLET BY MOUTH TWICE DAILY 60 tablet 0  . metoprolol succinate (TOPROL-XL) 100 MG 24 hr tablet Take 1 tablet (100 mg total) by mouth daily. Take with or immediately following a meal. 30 tablet 11  . pantoprazole (PROTONIX) 40 MG tablet     . spironolactone (ALDACTONE) 25 MG tablet TAKE ONE TABLET BY MOUTH ONCE DAILY 30 tablet 5  . valACYclovir (VALTREX) 500 MG tablet Take 1 tablet (500 mg total) by mouth 2 (two) times daily. At onset of symptoms for 5 days 10 tablet 5  . nitroGLYCERIN (NITROSTAT) 0.4 MG SL tablet Place 1 tablet (0.4 mg total) under the tongue every 5 (five) minutes as needed for chest pain. (Patient not taking: Reported on 02/21/2016) 25 tablet 1  . potassium chloride SA (K-DUR,KLOR-CON) 20 MEQ tablet Take 1 tablet (20 mEq total) by mouth daily. 10 tablet 0   No current facility-administered medications for this visit.     ROS   PHYSICAL EXAMINATION: ECOG PERFORMANCE STATUS: 1  Vitals:   02/21/16 0939  BP: (!) 186/65  Pulse: 62  Resp: 18  Temp: 98.7 F (37.1 C)   Filed Weights   02/21/16 0939  Weight: 200 lb 6.4 oz (90.9 kg)    Physical Exam  Constitutional: She appears well-developed and well-nourished.  HENT:  Head: Normocephalic and atraumatic.  Nose: Nose normal.  Mouth/Throat: Oropharynx is clear and moist.  Eyes: Conjunctivae and EOM are normal. No scleral icterus.  Neck: Normal range of motion. Neck supple. No JVD  present. No tracheal deviation present. No thyromegaly present.  Cardiovascular: Normal rate, regular rhythm and normal heart sounds.  Exam  reveals no gallop and no friction rub.   No murmur heard. Pulmonary/Chest: Effort normal and breath sounds normal. No respiratory distress. She has no wheezes. She has no rales. Right breast exhibits no inverted nipple, no mass, no nipple discharge, no skin change and no tenderness. Left breast exhibits no inverted nipple, no mass, no nipple discharge, no skin change and no tenderness. Breasts are symmetrical. There is no breast swelling.  Abdominal: Soft. Bowel sounds are normal. She exhibits no distension. There is no tenderness. There is no rebound. No hernia.  Genitourinary: No breast bleeding.  Musculoskeletal: Normal range of motion. She exhibits no edema or tenderness.  Lymphadenopathy:    She has no cervical adenopathy.  Neurological: She is alert. She has normal reflexes. She displays normal reflexes. No cranial nerve deficit. She exhibits normal muscle tone. Coordination normal.  Skin: Skin is warm and dry. No rash noted. No erythema. No pallor.  Psychiatric: She has a normal mood and affect. Her behavior is normal. Judgment and thought content normal.  left breast (+) small lumps below the surgical scar, likely scar tissue, no other palpable breast mass or axillary nodes.   LABORATORY DATA:  CBC Latest Ref Rng & Units 02/21/2016 08/24/2015 03/29/2015  WBC 3.9 - 10.3 10e3/uL 9.5 8.4 16.6(H)  Hemoglobin 11.6 - 15.9 g/dL 12.1 12.8 12.6  Hematocrit 34.8 - 46.6 % 37.7 40.0 39.2  Platelets 145 - 400 10e3/uL 168 194 204    CMP Latest Ref Rng & Units 02/21/2016 08/24/2015 03/29/2015  Glucose 70 - 140 mg/dl 112 243(H) 297(H)  BUN 7.0 - 26.0 mg/dL 12.0 15.8 24.2  Creatinine 0.6 - 1.1 mg/dL 0.9 1.1 1.1  Sodium 136 - 145 mEq/L 144 141 141  Potassium 3.5 - 5.1 mEq/L 3.3(L) 3.7 4.1  Chloride 96 - 112 mEq/L - - -  CO2 22 - 29 mEq/L 28 29 25   Calcium 8.4 - 10.4 mg/dL 9.7 9.6 9.7  Total Protein 6.4 - 8.3 g/dL 6.8 7.2 7.4  Total Bilirubin 0.20 - 1.20 mg/dL 0.56 0.65 0.64  Alkaline Phos 40 - 150  U/L 72 84 80  AST 5 - 34 U/L 18 21 20   ALT 0 - 55 U/L 19 19 17     RADIOGRAPHIC STUDIES: I have personally reviewed the radiological images as listed and agreed with the findings in the report.  Mammogram 11/22/2014: (-)   ASSESSMENT:   70 years old female with diagnosis of ALH (Atypical Lobular Hyperplasia) seen on a Lumpectomy of left breast. This was done for a palpable lump which was negative on mammogram and ultrasound. She had previously a right breast biopsy which was also negative for malignancy.   1. Left breast ALH  -Dr. Lona Kettle has previously extensively discussed the risks of developing breast cancer and chemoprevention with tamoxifen versus anastrozole. The overall benefit of chemoprevention is reduce her breast cancer by 50% with absolute benefit 3.5-4% risk reduction. She agreed to take chemoprevention  - she has tried anastrozole and tamoxifen but could not tolerate due to side effects. -we discussed  alternative chemopevention with  Raloxifene, and she decided not to try  -continue annual screening mammogram, her last one in May 2016 was in normal. She is overdue, I'll order for her and she will call [facility] to schedule. - due to her dense breast tissue , I also recommend breast MRI once a year if  her insurance covers it  -She has strong family history of breast cancer, but her genetic testing was negative.  2. Iron deficient anemia -she has developed moderate microcytic anemia in the past 3-4 month, ferritin 11, low iron and sat, supports iron deficient anemia. Possible related to her prior surgery.    -continue ferrous sulfate 325 mg 2-3 times a day. -Her hemoglobin has normalized, serum iron and ferritin level also normalized, I encouraged her to continue oral iron pill. -She had her last colonoscopy in 2012, polyps was removed, I encouraged her to follow-up with her GI  4. HTN, DM, CAD, history of TIA -She will continue follow-up with her primary care  [facility] in the next month -Breast MRI with and without contrast in 3-4 months -I'll see her back in one year.    Truitt Merle 02/21/2016

## 2016-03-06 ENCOUNTER — Encounter: Payer: Self-pay | Admitting: Family

## 2016-03-06 ENCOUNTER — Encounter: Payer: Self-pay | Admitting: Cardiovascular Disease

## 2016-03-15 ENCOUNTER — Ambulatory Visit (HOSPITAL_COMMUNITY): Payer: Medicare Other | Attending: Family

## 2016-03-15 ENCOUNTER — Ambulatory Visit: Payer: Medicare Other | Admitting: Family

## 2016-03-20 DIAGNOSIS — I10 Essential (primary) hypertension: Secondary | ICD-10-CM | POA: Diagnosis not present

## 2016-03-20 DIAGNOSIS — Z6835 Body mass index (BMI) 35.0-35.9, adult: Secondary | ICD-10-CM | POA: Diagnosis not present

## 2016-03-20 DIAGNOSIS — M4806 Spinal stenosis, lumbar region: Secondary | ICD-10-CM | POA: Diagnosis not present

## 2016-03-22 ENCOUNTER — Ambulatory Visit (INDEPENDENT_AMBULATORY_CARE_PROVIDER_SITE_OTHER): Payer: Medicare Other | Admitting: Cardiovascular Disease

## 2016-03-22 ENCOUNTER — Other Ambulatory Visit: Payer: Self-pay | Admitting: Neurosurgery

## 2016-03-22 ENCOUNTER — Encounter: Payer: Self-pay | Admitting: Cardiovascular Disease

## 2016-03-22 VITALS — BP 160/76 | HR 53 | Ht 64.0 in | Wt 199.8 lb

## 2016-03-22 DIAGNOSIS — M48061 Spinal stenosis, lumbar region without neurogenic claudication: Secondary | ICD-10-CM

## 2016-03-22 DIAGNOSIS — E785 Hyperlipidemia, unspecified: Secondary | ICD-10-CM

## 2016-03-22 DIAGNOSIS — I10 Essential (primary) hypertension: Secondary | ICD-10-CM

## 2016-03-22 DIAGNOSIS — I2510 Atherosclerotic heart disease of native coronary artery without angina pectoris: Secondary | ICD-10-CM

## 2016-03-22 NOTE — Patient Instructions (Signed)
Medication Instructions:  Your physician recommends that you continue on your current medications as directed. Please refer to the Current Medication list given to you today.  Labwork: No new orders.   Testing/Procedures: No new orders.   Follow-Up: Your physician wants you to follow-up in: 1 YEAR with Dr Burt Knack.  You will receive a reminder letter in the mail two months in advance. If you don't receive a letter, please call our office to schedule the follow-up appointment.   Any Other Special Instructions Will Be Listed Below (If Applicable).  Your physician has requested that you regularly monitor and record your blood pressure readings at home. Please use the same machine at the same time of day to check your readings and record them to bring to your follow-up visit.    If you need a refill on your cardiac medications before your next appointment, please call your pharmacy.

## 2016-03-22 NOTE — Progress Notes (Signed)
Cardiology Office Note Date:  03/22/2016   ID:  [name], DOB April 21, 1946, MRN [id number]  PCP:  Laurey Morale, MD  Cardiologist:  Sherren Mocha, MD    Chief Complaint  Patient presents with  . Follow-up    CAD     History of Present Illness: [name] is a 70 y.o. female who presents for follow-up evaluation. The patient is followed for coronary artery disease, diabetes, hypertension, cerebrovascular disease with history of TIA, and carotid stenosis status post right carotid endarterectomy. She has undergone PCI of the right coronary artery and pressure wire analysis of the LAD/diagonal branches when she presented with unstable angina in 2010. She was evaluated for chest pain and 2016 in the emergency room and underwent a nuclear scan that was abnormal. Cardiac catheterization showed moderate nonobstructive coronary artery disease with no changes compared to the 2010 cath study. Her right coronary artery stent was patent and her coronary anatomy was stable. Medical therapy was recommended. An echocardiogram showed normal LV function with moderate diastolic dysfunction. She's had significant problems with hypertension and difficulty tolerating antihypertensive medications.  The patient is doing well. She reports compliance with her medications. She's had no leg swelling, chest pain, heart palpitations, orthopnea, PND, lightheadedness, or syncope.  Past Medical History:  Diagnosis Date  . Abnormal nuclear stress test 12/29/2014  . Allergy   . Anemia, unspecified   . Arthritis   . CAD (coronary artery disease)    sees. Dr. Burt Knack. s/p cath with PCI of RCA (xience des) June 2010. Pt also with LAD and D2 dzs-NL FFR in both areas med Rx  . Carotid artery occlusion   . Cataract    sees Dr. Herbert Deaner   . Depressive disorder, not elsewhere classified   . Duodenal nodule   . Edema   . Gastric polyps    COLON  . GERD (gastroesophageal reflux disease)   . Glaucoma    narrow  angle, sees Dr. Herbert Deaner   . Headache(784.0)   . Hemorrhoids   . HSV (herpes simplex virus) anogenital infection 05/2014  . HTN (hypertension)   . Hyperlipidemia   . Low back pain   . Obesity   . Peripheral vascular disease ([place])   . Stroke [facility])    TIA  "many yrs ago"    . TIA (transient ischemic attack)    also Hx of it.   . Type II or unspecified type diabetes mellitus without mention of complication, not stated as uncontrolled    sees Dr. Carrolyn Meiers     Past Surgical History:  Procedure Laterality Date  . ABDOMINAL HYSTERECTOMY  age 43   TAH and USO  Leiomyomata  . ANTERIOR CERVICAL CORPECTOMY N/A 06/11/2014   Procedure: ANTERIOR CERVICAL CORPECTOMY CERVICAL SIX; WITH CERVICAL FIVE TO CERVICAL SEVEN ARTHRODESIS;  Surgeon: Ashok Pall, MD;  Location: [place] NEURO ORS;  Service: Neurosurgery;  Laterality: N/A;  . BACK SURGERY  2008   lumb fusion  . bilateral carpal tunnel release    . BREAST BIOPSY Left 01/26/2014   Procedure: EXCISION LEFT BREAST MASS;  Surgeon: Adin Hector, MD;  Location: [place];  Service: General;  Laterality: Left;  . CARDIAC CATHETERIZATION    . CARDIAC CATHETERIZATION N/A 12/29/2014   Procedure: Left Heart Cath and Coronary Angiography;  Surgeon: Sherren Mocha, MD;  Location: [place] CV LAB;  Service: Cardiovascular;  Laterality: N/A;  . CAROTID ENDARTERECTOMY  march 2012   per Dr. Morton Amy  .  CARPAL TUNNEL RELEASE Left 05/14/2014   Procedure: Left Carpal tunnel release;  Surgeon: Ashok Pall, MD;  Location: [place] NEURO ORS;  Service: Neurosurgery;  Laterality: Left;  Left Carpal tunnel release  . CHOLECYSTECTOMY    . COLONOSCOPY  05-29-11   per Dr. Henrene Pastor, benign polyps, repeat in 5 yrs    . DILATION AND CURETTAGE OF UTERUS    . ESOPHAGOGASTRODUODENOSCOPY  02-08-06   per Dr. Henrene Pastor, gastritis   . EYE SURGERY Left June 2016   Cataract  . GLAUCOMA SURGERY     with laser, per Dr. Henrene Pastor   . ULNAR NERVE TRANSPOSITION Left  05/14/2014   Procedure: Left Ulnar nerve decompression;  Surgeon: Ashok Pall, MD;  Location: [place] NEURO ORS;  Service: Neurosurgery;  Laterality: Left;  Left Ulnar nerve decompression    Current Outpatient Prescriptions  Medication Sig Dispense Refill  . acetaminophen (TYLENOL) 500 MG tablet Take 500 mg by mouth daily.    Marland Kitchen aspirin 81 MG tablet Take 81 mg by mouth daily.     . CRESTOR 40 MG tablet TAKE ONE TABLET BY MOUTH ONCE DAILY 30 tablet 3  . diclofenac sodium (VOLTAREN) 1 % GEL Apply 2 g topically 4 (four) times daily as needed (for pain).     Marland Kitchen ergocalciferol (VITAMIN D2) 50000 UNITS capsule Take 50,000 Units by mouth once a week. On Saturday.    . furosemide (LASIX) 40 MG tablet TAKE ONE TABLET BY MOUTH ONCE DAILY 30 tablet 0  . hydrALAZINE (APRESOLINE) 100 MG tablet Take 100 mg by mouth 3 (three) times daily.    . insulin aspart (NOVOLOG) 100 UNIT/ML injection Inject 10 Units into the skin 3 (three) times daily with meals.     . irbesartan (AVAPRO) 300 MG tablet TAKE ONE TABLET BY MOUTH ONCE DAILY 30 tablet 0  . isosorbide mononitrate (IMDUR) 60 MG 24 hr tablet Take 60 mg by mouth daily.    Marland Kitchen latanoprost (XALATAN) 0.005 % ophthalmic solution Place 1 drop into both eyes daily.     . methyldopa (ALDOMET) 250 MG tablet TAKE ONE TABLET BY MOUTH TWICE DAILY 60 tablet 0  . metoprolol succinate (TOPROL-XL) 100 MG 24 hr tablet Take 1 tablet (100 mg total) by mouth daily. Take with or immediately following a meal. 30 tablet 11  . nitroGLYCERIN (NITROSTAT) 0.4 MG SL tablet Place 1 tablet (0.4 mg total) under the tongue every 5 (five) minutes as needed for chest pain. 25 tablet 1  . pantoprazole (PROTONIX) 40 MG tablet Take 40 mg by mouth daily.     . potassium chloride SA (K-DUR,KLOR-CON) 20 MEQ tablet Take 1 tablet (20 mEq total) by mouth daily. 10 tablet 0  . spironolactone (ALDACTONE) 25 MG tablet TAKE ONE TABLET BY MOUTH ONCE DAILY 30 tablet 5  . valACYclovir (VALTREX) 500 MG tablet Take 1  tablet (500 mg total) by mouth 2 (two) times daily. At onset of symptoms for 5 days 10 tablet 5   No current facility-administered medications for this visit.     Allergies:   Review of patient's allergies indicates no known allergies.   Social History:  The patient  reports that she quit smoking about 49 years ago. She has never used smokeless tobacco. She reports that she does not drink alcohol or use drugs.   Family History:  [name] family history includes Breast cancer (age of onset: 56) in her cousin; Breast cancer (age of onset: 64) in her maternal aunt; Breast cancer (age of onset: 85)  in her cousin; Breast cancer (age of onset: 32) in her cousin; Cancer in her brother, father, and maternal aunt; Diabetes in her brother; Heart disease in her brother and maternal grandfather; Hyperlipidemia in her sister; Hypertension in her brother, mother, and sister; Prostate cancer (age of onset: 83) in her cousin; Stomach cancer (age of onset: 35) in her maternal grandmother; Varicose Veins in her brother.   ROS:  Please see the history of present illness. All other systems are reviewed and negative.   PHYSICAL EXAM: VS:  BP (!) 160/76   Pulse (!) 53   Ht 5\' 4"  (1.626 m)   Wt 90.6 kg (199 lb 12.8 oz)   BMI 34.30 kg/m  , BMI Body mass index is 34.3 kg/m. GEN: Well nourished, well developed, in no acute distress  HEENT: normal  Neck: no JVD, no masses. No carotid bruits Cardiac: RRR without murmur or gallop                Respiratory:  clear to auscultation bilaterally, normal work of breathing GI: soft, nontender, nondistended, + BS MS: no deformity or atrophy  Ext: no pretibial edema, pedal pulses 2+= bilaterally Skin: warm and dry, no rash Neuro:  Strength and sensation are intact Psych: euthymic mood, full affect  EKG:  EKG is ordered today. The ekg ordered today shows Sinus bradycardia, otherwise within normal limits  Recent Labs: 02/21/2016: ALT 19; BUN 12.0; Creatinine 0.9;  HGB 12.1; Platelets 168; Potassium 3.3; Sodium 144   Lipid Panel     Component Value Date/Time   CHOL 153 12/28/2014 1056   TRIG 119.0 12/28/2014 1056   HDL 35.70 (L) 12/28/2014 1056   CHOLHDL 4 12/28/2014 1056   VLDL 23.8 12/28/2014 1056   LDLCALC 94 12/28/2014 1056      Wt Readings from Last 3 Encounters:  03/22/16 90.6 kg (199 lb 12.8 oz)  02/21/16 90.9 kg (200 lb 6.4 oz)  01/05/16 90.3 kg (199 lb)     Cardiac Studies Reviewed: 2D Echo 12-30-2014: Left ventricle:  The cavity size was normal. Wall thickness was normal. Systolic function was normal. The estimated ejection fraction was in the range of 60% to 65%. Wall motion was normal; there were no regional wall motion abnormalities. Features are consistent with a pseudonormal left ventricular filling pattern, with concomitant abnormal relaxation and increased filling pressure (grade 2 diastolic dysfunction).  ------------------------------------------------------------------- Aortic valve:   Mildly thickened leaflets. Cusp separation was normal.  Doppler:  Transvalvular velocity was within the normal range. There was no stenosis. There was no regurgitation.  ------------------------------------------------------------------- Aorta:  Aortic root: The aortic root was normal in size. Ascending aorta: The ascending aorta was normal in size.  ------------------------------------------------------------------- Mitral valve:   Structurally normal valve.   Leaflet separation was normal.  Doppler:  Transvalvular velocity was within the normal range. There was no evidence for stenosis. There was no regurgitation.    Peak gradient (D): 5 mm Hg.  ------------------------------------------------------------------- Left atrium:  The atrium was normal in size.  ------------------------------------------------------------------- Right ventricle:  The cavity size was normal. Systolic function  was normal.  ------------------------------------------------------------------- Pulmonic valve:    The valve appears to be grossly normal. Doppler:  There was trivial regurgitation.  ------------------------------------------------------------------- Tricuspid valve:   Structurally normal valve.   Leaflet separation was normal.  Doppler:  Transvalvular velocity was within the normal range. There was trivial regurgitation.  ------------------------------------------------------------------- Right atrium:  The atrium was normal in size.  ------------------------------------------------------------------- Pericardium:  There was no pericardial effusion.  -------------------------------------------------------------------  Systemic veins: Inferior vena cava: The vessel was normal in size. The respirophasic diameter changes were in the normal range (>= 50%), consistent with normal central venous pressure.  ASSESSMENT AND PLAN: 1.  Coronary artery disease, native vessel, without symptoms of angina: Last heart catheterization reviewed. Stable coronary anatomy was noted. The patient will continue on her current medical regimen which includes aspirin, a statin drug, and the beta blocker.  2. Essential hypertension, malignant: [name] blood pressure is elevated today. She is on a complex multidrug regimen and has been intolerant to amlodipine and clonidine. She is on all other classes of antihypertensive medications that I know to treat her with. I asked her to obtain a blood pressure cuff and bring readings and when she follows up. Discussed sodium restriction, exercise.   3. Dyslipidemia: Lipids followed by her primary physician. Continue high intensity statin drug.  4. Carotid stenosis: Followed by vascular surgery  Current medicines are reviewed with the patient today.  The patient does not have concerns regarding medicines.  Labs/ tests ordered today include:  No orders of  the defined types were placed in this encounter.  Disposition:   FU one year  Signed, Sherren Mocha, MD  03/22/2016 11:10 AM    [address], [place], [place]  [address] Phone: [phone]; Fax: [phone]

## 2016-03-24 ENCOUNTER — Other Ambulatory Visit: Payer: Self-pay | Admitting: Family Medicine

## 2016-03-26 ENCOUNTER — Other Ambulatory Visit: Payer: Self-pay | Admitting: *Deleted

## 2016-03-26 MED ORDER — IRBESARTAN 300 MG PO TABS: 300.0000 mg | ORAL_TABLET | Freq: Every day | ORAL | 3 refills | Status: DC

## 2016-03-27 NOTE — Telephone Encounter (Signed)
Okay to refill? 

## 2016-03-29 ENCOUNTER — Encounter: Payer: Self-pay | Admitting: Family Medicine

## 2016-03-29 DIAGNOSIS — Z09 Encounter for follow-up examination after completed treatment for conditions other than malignant neoplasm: Secondary | ICD-10-CM | POA: Diagnosis not present

## 2016-03-29 DIAGNOSIS — Z803 Family history of malignant neoplasm of breast: Secondary | ICD-10-CM | POA: Diagnosis not present

## 2016-04-05 ENCOUNTER — Other Ambulatory Visit: Payer: Medicare Other

## 2016-04-09 ENCOUNTER — Other Ambulatory Visit: Payer: Self-pay | Admitting: Cardiovascular Disease

## 2016-04-11 ENCOUNTER — Other Ambulatory Visit: Payer: Self-pay | Admitting: *Deleted

## 2016-04-11 MED ORDER — METHYLDOPA 250 MG PO TABS: 250.0000 mg | ORAL_TABLET | Freq: Two times a day (BID) | ORAL | 3 refills | Status: DC

## 2016-04-12 ENCOUNTER — Ambulatory Visit
Admission: RE | Admit: 2016-04-12 | Discharge: 2016-04-12 | Disposition: A | Discharge status: Home / Self Care | Payer: Medicare Other | Source: Ambulatory Visit | Attending: Neurosurgery | Admitting: Neurosurgery

## 2016-04-12 DIAGNOSIS — M48061 Spinal stenosis, lumbar region without neurogenic claudication: Secondary | ICD-10-CM | POA: Diagnosis not present

## 2016-04-12 MED ORDER — METHYLPREDNISOLONE ACETATE 40 MG/ML INJ SUSP (RADIOLOG
120.0000 mg | Freq: Once | INTRAMUSCULAR | Status: AC
  Administered 2016-04-12: 120 mg via EPIDURAL

## 2016-04-12 MED ORDER — IOPAMIDOL (ISOVUE-M 200) INJECTION 41%
1.0000 mL | Freq: Once | INTRAMUSCULAR | Status: AC
  Administered 2016-04-12: 1 mL via EPIDURAL

## 2016-04-12 NOTE — Discharge Instructions (Signed)

## 2016-04-14 ENCOUNTER — Other Ambulatory Visit: Payer: Self-pay | Admitting: Cardiovascular Disease

## 2016-04-18 ENCOUNTER — Encounter: Payer: Self-pay | Admitting: Internal Medicine

## 2016-04-21 DIAGNOSIS — Z23 Encounter for immunization: Secondary | ICD-10-CM | POA: Diagnosis not present

## 2016-04-25 DIAGNOSIS — Z6832 Body mass index (BMI) 32.0-32.9, adult: Secondary | ICD-10-CM | POA: Diagnosis not present

## 2016-04-25 DIAGNOSIS — I10 Essential (primary) hypertension: Secondary | ICD-10-CM | POA: Diagnosis not present

## 2016-04-25 DIAGNOSIS — E1159 Type 2 diabetes mellitus with other circulatory complications: Secondary | ICD-10-CM | POA: Diagnosis not present

## 2016-04-30 DIAGNOSIS — R42 Dizziness and giddiness: Secondary | ICD-10-CM | POA: Diagnosis not present

## 2016-04-30 DIAGNOSIS — H903 Sensorineural hearing loss, bilateral: Secondary | ICD-10-CM | POA: Diagnosis not present

## 2016-04-30 DIAGNOSIS — H9120 Sudden idiopathic hearing loss, unspecified ear: Secondary | ICD-10-CM | POA: Diagnosis not present

## 2016-04-30 DIAGNOSIS — H9122 Sudden idiopathic hearing loss, left ear: Secondary | ICD-10-CM | POA: Diagnosis not present

## 2016-05-08 ENCOUNTER — Encounter: Payer: Self-pay | Admitting: Family

## 2016-05-10 DIAGNOSIS — Z87891 Personal history of nicotine dependence: Secondary | ICD-10-CM | POA: Diagnosis not present

## 2016-05-10 DIAGNOSIS — E119 Type 2 diabetes mellitus without complications: Secondary | ICD-10-CM | POA: Diagnosis not present

## 2016-05-10 DIAGNOSIS — H9122 Sudden idiopathic hearing loss, left ear: Secondary | ICD-10-CM | POA: Diagnosis not present

## 2016-05-14 DIAGNOSIS — H9122 Sudden idiopathic hearing loss, left ear: Secondary | ICD-10-CM | POA: Diagnosis not present

## 2016-05-17 DIAGNOSIS — H9122 Sudden idiopathic hearing loss, left ear: Secondary | ICD-10-CM | POA: Diagnosis not present

## 2016-05-21 DIAGNOSIS — H9042 Sensorineural hearing loss, unilateral, left ear, with unrestricted hearing on the contralateral side: Secondary | ICD-10-CM | POA: Diagnosis not present

## 2016-05-21 DIAGNOSIS — H9122 Sudden idiopathic hearing loss, left ear: Secondary | ICD-10-CM | POA: Diagnosis not present

## 2016-05-24 DIAGNOSIS — H9122 Sudden idiopathic hearing loss, left ear: Secondary | ICD-10-CM | POA: Diagnosis not present

## 2016-05-28 DIAGNOSIS — H9122 Sudden idiopathic hearing loss, left ear: Secondary | ICD-10-CM | POA: Diagnosis not present

## 2016-06-12 DIAGNOSIS — N62 Hypertrophy of breast: Secondary | ICD-10-CM | POA: Diagnosis not present

## 2016-06-12 DIAGNOSIS — I6523 Occlusion and stenosis of bilateral carotid arteries: Secondary | ICD-10-CM | POA: Diagnosis not present

## 2016-06-12 DIAGNOSIS — Z6833 Body mass index (BMI) 33.0-33.9, adult: Secondary | ICD-10-CM | POA: Diagnosis not present

## 2016-06-12 DIAGNOSIS — K635 Polyp of colon: Secondary | ICD-10-CM | POA: Diagnosis not present

## 2016-06-12 DIAGNOSIS — E784 Other hyperlipidemia: Secondary | ICD-10-CM | POA: Diagnosis not present

## 2016-06-12 DIAGNOSIS — D860 Sarcoidosis of lung: Secondary | ICD-10-CM | POA: Diagnosis not present

## 2016-06-12 DIAGNOSIS — H4089 Other specified glaucoma: Secondary | ICD-10-CM | POA: Diagnosis not present

## 2016-06-12 DIAGNOSIS — I10 Essential (primary) hypertension: Secondary | ICD-10-CM | POA: Diagnosis not present

## 2016-06-12 DIAGNOSIS — E1159 Type 2 diabetes mellitus with other circulatory complications: Secondary | ICD-10-CM | POA: Diagnosis not present

## 2016-06-12 DIAGNOSIS — E559 Vitamin D deficiency, unspecified: Secondary | ICD-10-CM | POA: Diagnosis not present

## 2016-06-12 DIAGNOSIS — I2510 Atherosclerotic heart disease of native coronary artery without angina pectoris: Secondary | ICD-10-CM | POA: Diagnosis not present

## 2016-06-13 ENCOUNTER — Encounter: Payer: Self-pay | Admitting: Family

## 2016-06-13 DIAGNOSIS — H9122 Sudden idiopathic hearing loss, left ear: Secondary | ICD-10-CM | POA: Diagnosis not present

## 2016-06-13 DIAGNOSIS — H9120 Sudden idiopathic hearing loss, unspecified ear: Secondary | ICD-10-CM | POA: Diagnosis not present

## 2016-06-13 DIAGNOSIS — H9042 Sensorineural hearing loss, unilateral, left ear, with unrestricted hearing on the contralateral side: Secondary | ICD-10-CM | POA: Diagnosis not present

## 2016-06-14 ENCOUNTER — Ambulatory Visit (INDEPENDENT_AMBULATORY_CARE_PROVIDER_SITE_OTHER): Payer: Medicare Other | Admitting: Family

## 2016-06-14 ENCOUNTER — Ambulatory Visit (HOSPITAL_COMMUNITY)
Admission: RE | Admit: 2016-06-14 | Discharge: 2016-06-14 | Disposition: A | Discharge status: Home / Self Care | Payer: Medicare Other | Source: Ambulatory Visit | Attending: Vascular Surgery | Admitting: Vascular Surgery

## 2016-06-14 ENCOUNTER — Encounter: Payer: Self-pay | Admitting: Family

## 2016-06-14 VITALS — BP 174/78 | HR 71 | Temp 98.0°F | Resp 16 | Ht 64.0 in | Wt 200.6 lb

## 2016-06-14 DIAGNOSIS — Z48812 Encounter for surgical aftercare following surgery on the circulatory system: Secondary | ICD-10-CM | POA: Diagnosis not present

## 2016-06-14 DIAGNOSIS — Z9889 Other specified postprocedural states: Secondary | ICD-10-CM | POA: Insufficient documentation

## 2016-06-14 DIAGNOSIS — I6523 Occlusion and stenosis of bilateral carotid arteries: Secondary | ICD-10-CM | POA: Diagnosis not present

## 2016-06-14 LAB — VAS US CAROTID
LCCADDIAS: 29 cm/s
LCCADSYS: 116 cm/s
LCCAPSYS: 162 cm/s
LEFT ECA DIAS: 0 cm/s
LICADSYS: -104 cm/s
LICAPSYS: -113 cm/s
Left CCA prox dias: 22 cm/s
Left ICA dist dias: -44 cm/s
Left ICA prox dias: -33 cm/s
RCCADSYS: -141 cm/s
RCCAPSYS: 150 cm/s
RIGHT CCA MID DIAS: -25 cm/s
RIGHT ECA DIAS: 0 cm/s
Right CCA prox dias: 17 cm/s
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
## 2016-06-14 NOTE — Patient Instructions (Signed)
Stroke Prevention Some medical conditions and behaviors are associated with an increased chance of having a stroke. You may prevent a stroke by making healthy choices and managing medical conditions. How can I reduce my risk of having a stroke?  Stay physically active. Get at least 30 minutes of activity on most or all days.  Do not smoke. It may also be helpful to avoid exposure to secondhand smoke.  Limit alcohol use. Moderate alcohol use is considered to be:  No more than 2 drinks per day for men.  No more than 1 drink per day for nonpregnant women.  Eat healthy foods. This involves:  Eating 5 or more servings of fruits and vegetables a day.  Making dietary changes that address high blood pressure (hypertension), high cholesterol, diabetes, or obesity.  Manage your cholesterol levels.  Making food choices that are high in fiber and low in saturated fat, trans fat, and cholesterol may control cholesterol levels.  Take any prescribed medicines to control cholesterol as directed by your health care [name].  Manage your diabetes.  Controlling your carbohydrate and sugar intake is recommended to manage diabetes.  Take any prescribed medicines to control diabetes as directed by your health care [name].  Control your hypertension.  Making food choices that are low in salt (sodium), saturated fat, trans fat, and cholesterol is recommended to manage hypertension.  Ask your health care [name] if you need treatment to lower your blood pressure. Take any prescribed medicines to control hypertension as directed by your health care [name].  If you are 18-39 years of age, have your blood pressure checked every 3-5 years. If you are 40 years of age or older, have your blood pressure checked every year.  Maintain a healthy weight.  Reducing calorie intake and making food choices that are low in sodium, saturated fat, trans fat, and cholesterol are recommended to manage  weight.  Stop drug abuse.  Avoid taking birth control pills.  Talk to your health care [name] about the risks of taking birth control pills if you are over 35 years old, smoke, get migraines, or have Karen had a blood clot.  Get evaluated for sleep disorders (sleep apnea).  Talk to your health care [name] about getting a sleep evaluation if you snore a lot or have excessive sleepiness.  Take medicines only as directed by your health care [name].  For some people, aspirin or blood thinners (anticoagulants) are helpful in reducing the risk of forming abnormal blood clots that can lead to stroke. If you have the irregular heart rhythm of atrial fibrillation, you should be on a blood thinner unless there is a good reason you cannot take them.  Understand all your medicine instructions.  Make sure that other conditions (such as anemia or atherosclerosis) are addressed. Get help right away if:  You have sudden weakness or numbness of the face, arm, or leg, especially on one side of the body.  Your face or eyelid droops to one side.  You have sudden confusion.  You have trouble speaking (aphasia) or understanding.  You have sudden trouble seeing in one or both eyes.  You have sudden trouble walking.  You have dizziness.  You have a loss of balance or coordination.  You have a sudden, severe headache with no known cause.  You have new chest pain or an irregular heartbeat. Any of these symptoms may represent a serious problem that is an emergency. Do not wait to see if the symptoms will go away.   Get medical help at once. Call your local emergency services (911 in [place] Do not drive yourself to the hospital. This information is not intended to replace advice given to you by your health care [name]. Make sure you discuss any questions you have with your health care [name]. Document Released: 08/02/2004 Document Revised: 12/01/2015 Document Reviewed: 12/26/2012 Elsevier  Interactive Patient Education  2017 Elsevier Inc.  

## 2016-06-15 NOTE — Addendum Note (Signed)
Addended by: Lianne Cure A on: 06/15/2016 08:42 AM   Modules accepted: Orders

## 2016-06-26 DIAGNOSIS — H722X2 Other marginal perforations of tympanic membrane, left ear: Secondary | ICD-10-CM | POA: Diagnosis not present

## 2016-06-26 DIAGNOSIS — H6121 Impacted cerumen, right ear: Secondary | ICD-10-CM | POA: Diagnosis not present

## 2016-06-26 DIAGNOSIS — H6123 Impacted cerumen, bilateral: Secondary | ICD-10-CM | POA: Diagnosis not present

## 2016-06-26 DIAGNOSIS — H903 Sensorineural hearing loss, bilateral: Secondary | ICD-10-CM | POA: Diagnosis not present

## 2016-06-26 DIAGNOSIS — Z87891 Personal history of nicotine dependence: Secondary | ICD-10-CM | POA: Diagnosis not present

## 2016-06-28 ENCOUNTER — Encounter: Payer: Self-pay | Admitting: Family Medicine

## 2016-06-28 DIAGNOSIS — H402233 Chronic angle-closure glaucoma, bilateral, severe stage: Secondary | ICD-10-CM | POA: Diagnosis not present

## 2016-06-28 DIAGNOSIS — H26491 Other secondary cataract, right eye: Secondary | ICD-10-CM | POA: Diagnosis not present

## 2016-06-28 DIAGNOSIS — E119 Type 2 diabetes mellitus without complications: Secondary | ICD-10-CM | POA: Diagnosis not present

## 2016-06-28 LAB — HM DIABETES EYE EXAM

## 2016-07-06 ENCOUNTER — Other Ambulatory Visit: Payer: Self-pay | Admitting: Cardiovascular Disease

## 2016-07-06 ENCOUNTER — Other Ambulatory Visit: Payer: Self-pay | Admitting: Physician Assistant

## 2016-07-06 DIAGNOSIS — I10 Essential (primary) hypertension: Secondary | ICD-10-CM

## 2016-07-19 ENCOUNTER — Telehealth: Payer: Self-pay | Admitting: Podiatry

## 2016-07-19 NOTE — Telephone Encounter (Signed)
Pt lvm at 230pm for an appt she is having foot and ankle pain again.  I lvm at 246pm for pt to call to schedule an appt lvm again at 435pm..

## 2016-08-01 ENCOUNTER — Ambulatory Visit (INDEPENDENT_AMBULATORY_CARE_PROVIDER_SITE_OTHER): Payer: Medicare Other | Admitting: Family Medicine

## 2016-08-01 ENCOUNTER — Encounter: Payer: Self-pay | Admitting: Family Medicine

## 2016-08-01 VITALS — BP 170/76 | HR 69 | Temp 98.0°F | Ht 64.0 in | Wt 204.0 lb

## 2016-08-01 DIAGNOSIS — M48062 Spinal stenosis, lumbar region with neurogenic claudication: Secondary | ICD-10-CM | POA: Diagnosis not present

## 2016-08-01 NOTE — Progress Notes (Signed)
Pre visit review using our clinic review tool, if applicable. No additional management support is needed unless otherwise documented below in the visit note. 

## 2016-08-01 NOTE — Progress Notes (Signed)
   Subjective:    Patient ID: NORMA ZAKARIAN, female    DOB: 1945/07/25, 71 y.o.   MRN: [id number]  HPI Here asking for a referral to see Dr. Basil Dess for a second opinion on her back. She had a spinal fusion surgery at L4-5 per Dr. Dayton Bailiff in 2008, and she has recently been experiencing more low back pain over the past year. She had an MRI last August showing severe spinal stenosis at the L4 level just superior to the previous surgical site. Dr. Cyndy Freeze has tried several steroid injections to the area with little response, and now he is proposing to do another surgery. Jabrea wants to get a second opinion to help her make up her mind. She has severe low back pain which radiates into both thighs. She has some numbness and weakness of both legs as well.    Review of Systems  Respiratory: Negative.   Cardiovascular: Negative.   Gastrointestinal: Negative.   Genitourinary: Negative.   Musculoskeletal: Positive for back pain.  Neurological: Positive for weakness and numbness.       Objective:   Physical Exam  Constitutional: She is oriented to person, place, and time. She appears well-developed and well-nourished.  Cardiovascular: Normal rate, regular rhythm, normal heart sounds and intact distal pulses.   Pulmonary/Chest: Effort normal and breath sounds normal.  Musculoskeletal:  She is very tender over the lower back and both sciatic notches. SLR are painful   Neurological: She is alert and oriented to person, place, and time.          Assessment & Plan:  Low back pain with spinal stenosis on imaging. We will refer her to see Dr. Louanne Skye for another opinion.  Alysia Penna, MD

## 2016-08-02 ENCOUNTER — Ambulatory Visit (INDEPENDENT_AMBULATORY_CARE_PROVIDER_SITE_OTHER): Payer: Medicare Other | Admitting: Podiatry

## 2016-08-02 ENCOUNTER — Encounter: Payer: Self-pay | Admitting: Podiatry

## 2016-08-02 ENCOUNTER — Ambulatory Visit (INDEPENDENT_AMBULATORY_CARE_PROVIDER_SITE_OTHER): Payer: Medicare Other

## 2016-08-02 DIAGNOSIS — T148XXA Other injury of unspecified body region, initial encounter: Secondary | ICD-10-CM

## 2016-08-02 DIAGNOSIS — M25572 Pain in left ankle and joints of left foot: Secondary | ICD-10-CM

## 2016-08-02 DIAGNOSIS — M7752 Other enthesopathy of left foot: Secondary | ICD-10-CM

## 2016-08-02 DIAGNOSIS — M779 Enthesopathy, unspecified: Secondary | ICD-10-CM

## 2016-08-02 NOTE — Progress Notes (Signed)
Subjective:     Patient ID: [name], female   DOB: 07-26-1945, 71 y.o.   MRN: [id number]  HPI patient states she's had a lot of pain in her left ankle and it only responds very short-term to medications   Review of Systems     Objective:   Physical Exam Neurovascular status intact with patient's left lateral ankle continuing to swell around the peroneal group with mild edema within the ankle itself. It is painful when palpated within the peroneal group as it comes under the lateral malleolus and goes distal    Assessment:     Possibility for tear of the peroneal longus or brevis tendon    Plan:     Due to failure to respond to conservative care and recommended MRI to rule out tear or other bone soft tissue pathology. Will be seen back when we get results

## 2016-08-09 ENCOUNTER — Telehealth: Payer: Self-pay | Admitting: *Deleted

## 2016-08-09 NOTE — Telephone Encounter (Signed)
Pt called c/o boil in groin area, states has been there x 1 week now, I advised pt to schedule OV with provider. Pt scheduled to see TF 08/10/16.

## 2016-08-10 ENCOUNTER — Encounter: Payer: Self-pay | Admitting: Gynecology

## 2016-08-10 ENCOUNTER — Ambulatory Visit
Admission: RE | Admit: 2016-08-10 | Discharge: 2016-08-10 | Disposition: A | Discharge status: Home / Self Care | Payer: Medicare Other | Source: Ambulatory Visit | Attending: Podiatry | Admitting: Podiatry

## 2016-08-10 ENCOUNTER — Ambulatory Visit (INDEPENDENT_AMBULATORY_CARE_PROVIDER_SITE_OTHER): Payer: Medicare Other | Admitting: Gynecology

## 2016-08-10 VITALS — BP 124/82

## 2016-08-10 DIAGNOSIS — N764 Abscess of vulva: Secondary | ICD-10-CM

## 2016-08-10 DIAGNOSIS — M25572 Pain in left ankle and joints of left foot: Secondary | ICD-10-CM | POA: Diagnosis not present

## 2016-08-10 MED ORDER — DOXYCYCLINE HYCLATE 100 MG PO TABS: 100.0000 mg | ORAL_TABLET | Freq: Two times a day (BID) | ORAL | 0 refills | Status: DC

## 2016-08-10 NOTE — Progress Notes (Signed)
    [name] 08-19-1945 [id number]        71 y.o.  G2P1011 presents with a five-day history of enlarging vulvar area painful to the patient. Started draining yesterday. No fever or chills. No urinary symptoms such as dysuria frequency urgency. No nausea vomiting diarrhea constipation.  Past medical history,surgical history, problem list, medications, allergies, family history and social history were all reviewed and documented in the EPIC chart.  Directed ROS with pertinent positives and negatives documented in the history of present illness/assessment and plan.  Exam: [name] assistant Vitals:   08/10/16 1215  BP: 124/82   General appearance:  Normal Abdomen soft nontender without masses guarding rebound Pelvic external BUS vagina with 2-3 cm fluctuant left mid labia majora boil/small abscess. Slightly draining but unable to extrude any additional pus. No surrounding evidence of cellulitis. No inguinal adenopathy. Vagina unremarkable with atrophic changes. Bimanual without masses or tenderness.  Procedure: Skin overlying the pointing area of the boil was cleansed with Betadine and infiltrated with 1% lidocaine. A stab incision with a scalpel was made with scant return. A small hemostat was placed to probe the small cavity that I entered without significant return. Patient tolerated well.  Assessment/Plan:  71 y.o. G2P1011 with left labia majora boil/small abscess. Options for management reviewed to include expectant with sitz baths, antibiotics, I&D today. Patient preferred the I&D today. The area was opened and probed. No significant drainage was encountered. Pressure hemostasis achieved afterwards. Will cover with doxycycline 100 mg twice a day 10 days. Sitz baths 3 times a day. Follow up if the area persists or worsens.    [name], 12:52 PM 08/10/2016

## 2016-08-10 NOTE — Patient Instructions (Signed)
Take the prescribed antibiotic twice daily for 10 days. Do sitz baths 3 times a day. Follow up if this area does not resolve.

## 2016-08-21 ENCOUNTER — Encounter (INDEPENDENT_AMBULATORY_CARE_PROVIDER_SITE_OTHER): Payer: Self-pay | Admitting: Specialist

## 2016-08-21 ENCOUNTER — Ambulatory Visit (INDEPENDENT_AMBULATORY_CARE_PROVIDER_SITE_OTHER): Payer: Medicare Other

## 2016-08-21 ENCOUNTER — Ambulatory Visit (INDEPENDENT_AMBULATORY_CARE_PROVIDER_SITE_OTHER): Payer: Medicare Other | Admitting: Specialist

## 2016-08-21 DIAGNOSIS — M48062 Spinal stenosis, lumbar region with neurogenic claudication: Secondary | ICD-10-CM

## 2016-08-21 DIAGNOSIS — M545 Low back pain: Secondary | ICD-10-CM | POA: Diagnosis not present

## 2016-08-21 DIAGNOSIS — G8929 Other chronic pain: Secondary | ICD-10-CM

## 2016-08-21 DIAGNOSIS — M4316 Spondylolisthesis, lumbar region: Secondary | ICD-10-CM

## 2016-08-21 DIAGNOSIS — M5416 Radiculopathy, lumbar region: Secondary | ICD-10-CM | POA: Diagnosis not present

## 2016-08-21 DIAGNOSIS — M5136 Other intervertebral disc degeneration, lumbar region: Secondary | ICD-10-CM

## 2016-08-21 MED ORDER — GABAPENTIN 100 MG PO CAPS: ORAL_CAPSULE | ORAL | 3 refills | Status: DC

## 2016-08-21 NOTE — Patient Instructions (Signed)
Avoid bending, stooping and avoid lifting weights greater than 10 lbs. Avoid prolong standing and walking. Avoid frequent bending and stooping  No lifting greater than 10 lbs. May use ice or moist heat for pain. Weight loss is of benefit. Handicap license is approved.  

## 2016-08-21 NOTE — Progress Notes (Addendum)
Office Visit Note   Patient: [name]           Date of Birth: 1945-08-18           MRN: [id number] Visit Date: 08/21/2016              Requested by: Laurey Morale, MD [place], [place] [id number] PCP: Alysia Penna, MD   Assessment & Plan: Visit Diagnoses:  1. Chronic low back pain, unspecified back pain laterality, with sciatica presence unspecified   2. Spondylolisthesis, lumbar region   3. Spinal stenosis of lumbar region with neurogenic claudication   4. Degenerative disc disease, lumbar   5. Chronic left lumbar radiculopathy   This 71 year old female has right leg pain greater than back pain with an L4-5 fusion that is solid with extra osseous pedicle screw left L5 with chronic left L5 radiculopathy and neurogenic claudication symptoms primarily in the right thigh consistent with an L3 or L4 pattern of radiculopathy due to severe spinal stenosis at the L3-4 level. She has a new degenerative spondylolisthesis at L5-S1 on her radiographs today not demonstrated on MRI in 02/2016 performed in  supine position. Her surgical options are extension of her fusion to both L3-4 and L5-S1 with decompression and removal of the left L5 pedicle screw vs. extension of the fusion to L5-S1 with decompression of L3-4 and removal of the left L5 pedicle screw vs decompression alone of L3-4 and removal of the left L5 pedicle. As her pain is primarily right leg and claudication symptoms relieved temporarily or improved temporarily with ESI I would recommend for a decompressive laminectomy at the L3-4 level with removal of the hardware at L4-5 and exploration of the left L5 nerve root. She does have a dynamic slip at L5-S1 that was not previously seen on her studies but her pain pattern is upper lumbar L3 or L4 with chronic left L5 changes. I discussed the problem of multilevel degenerative disc disease and the fact that no one surgery is a perfect solution, with fusion one might expect  some improvement in mechanical back pain but she has always had DDD at the L3-4 level even at the time of her index L4-5 fusion for spondylolisthesis. The original surgery was for spinal stenosis. In treatment of spinal stenosis decompression of the involved segments and fusion of the unstable segments is recommended.  In this case if more fusions are undertaken the prospect of adjacent level changes worsens with both L5-S1 slip present and mild degenerative changes present at L1-2 and L2-3. All of her lumbar segments are the same age. There is also a mild lumbar left side curve. She understands that she may require fusion surgery should she develop mechanical back pain or a worsening spondylolisthesis at the L5-S1 level. In general patients having had lumbar fusion have a 20-40% chance of require more fusions in the future but fusion can be undertake later if need be. Once fusion is done the stress increases on the remaining lumbar segments can not be undone. This is a second opinion and not a referral for surgery so I have explain the sitiuation to [name] and she will follow up as needed. We will trial a course of gabapentin 100 mg qhs for one week then BID for neurogenic pain.   Plan: Avoid bending, stooping and avoid lifting weights greater than 10 lbs. Avoid prolong standing and walking. Avoid frequent bending and stooping  No lifting greater than 10 lbs.  May use ice or moist heat for pain. Weight loss is of benefit. Handicap license is approved.   Follow-Up Instructions: No Follow-up on file.   Orders:  Orders Placed This Encounter  Procedures  . XR Lumbar Spine 2-3 Views   No orders of the defined types were placed in this encounter.     Procedures: No procedures performed   Clinical Data: Findings:  Review of most recent MRI 02/2016 lumbar spine shows solid fusion L4-5, lumbar spinal stenosis that is severe at the L3-4 level secondary to posterior disc osteophytes at L3-4  and hypertrophic facets and ligamentum hypertrophy. Both lateral recesses are affected, at L5-S1 degenerative disc degeneration that is mild with descication and mild bulge, the left L5 neuroforamen is not able to be assessed due to adjacent pedicle screw and loss of resolution. The right L5 neuroforamen appears mildly narrowed. Minimal anterolisthesis at L5-S1  Of 1-59mm.  CT Scan of the lumbar spine from 2011 shows left L5 pedicle screw is extrapedicular inferior and medially placed and may impress of the superomedial aspect of the left L5 neuroforamen, The nerve root appears present but displaced by the left L5 pedicle screw,and the left L5 neuroforamen tranversed superiorly by the pedicle screw causing foramenal stenosis. Mild right foramenal stenosis. No fracture disclocation or subluxation noted. The CT scan from 2011 show degenerative disc disease at the L3-4 level with disc narrowing and end plate sclerosis.anterior osteophytes are present at L3-4 and to a lesser extent at L5-S1, L2-3 and L1-2.     Subjective: Chief Complaint  Patient presents with  . Lower Back - Pain    Second Opinion    [name] is here for second opinion for her back. She states that she has pain into bilateral buttocks.  On the Left it stays in the buttock area, on the right side it comes from the right buttock and radiates into the right anterior thigh. Pain is worsening and prevents her from standing and walking any distance. She uses electric cart with shopping at [facility] and at the grocery store. Mostly she plans quick trips in and out of the store for items. Pain in the right leg is sharp with numbness and paresthesias. Pain improves with sitting. No difficulty donning shoes or socks. Does ascend and descend stairs one step at a time. Has 10 steps to reach her front door. Prolong sitting and riding in the car worsening the  Pain with she first gets up. No bowel or bladder difficulty. She has had previous L4-5  fusion by Dr. Cyndy Freeze in 2008 with post operative left great toe numbness that has not recovered. Her pain levels improved with her surgery. Now her problems with standing and walking has recurred and she reports pattern of pain is changed to primarily right anterior thigh. Her left leg numbness is occasionally accompanied by sharp brief pain into the left leg and foot that is electricity like.     Review of Systems  HENT: Negative.   Eyes: Negative.   Respiratory: Negative.   Cardiovascular: Negative.   Gastrointestinal: Negative.   Endocrine: Negative.   Genitourinary: Negative.   Musculoskeletal: Positive for back pain and gait problem.  Skin: Negative.   Allergic/Immunologic: Negative.   Neurological: Positive for weakness and numbness.  Hematological: Negative.   Psychiatric/Behavioral: Negative.      Objective: Vital Signs: There were no vitals taken for this visit.  Physical Exam  Constitutional: She is oriented to person, place, and time. She appears well-developed and  well-nourished.  HENT:  Head: Normocephalic and atraumatic.  Eyes: EOM are normal. Pupils are equal, round, and reactive to light.  Neck: Normal range of motion. Neck supple.  Pulmonary/Chest: Effort normal and breath sounds normal.  Abdominal: Soft. Bowel sounds are normal.  Musculoskeletal: Normal range of motion.  Neurological: She is alert and oriented to person, place, and time.  Skin: Skin is warm and dry.  Psychiatric: She has a normal mood and affect. Her behavior is normal. Judgment and thought content normal.    Back Exam   Tenderness  The patient is experiencing tenderness in the lumbar.  Muscle Strength  Right Quadriceps:  5/5  Left Quadriceps:  4/5  Right Hamstrings:  5/5  Left Hamstrings:  5/5   Tests  Straight leg raise right: negative Straight leg raise left: negative  Reflexes  Patellar: normal Achilles: normal Babinski's sign: normal   Other  Toe Walk: abnormal Heel  Walk: abnormal Sensation: decreased Gait: abnormal  Erythema: no back redness Scars: absent  Comments:  Weakness in bilateral foot Dorsiflexion 4+/5 and right knee quad 5-/5 Left EHL is weak 4/5.SLR is negative.      Specialty Comments:  No specialty comments available.  Imaging: No results found.   PMFS History: Patient Active Problem List   Diagnosis Date Noted  . Genetic testing 09/14/2015  . Atypical ductal hyperplasia of left breast 08/24/2015  . Family history of breast cancer   . Family history of prostate cancer   . Family history of stomach cancer   . Abnormal nuclear stress test 12/29/2014  . Cervical spondylosis with myelopathy and radiculopathy 06/11/2014  . Carpal tunnel syndrome 05/14/2014  . Aftercare following surgery of the circulatory system, [place] 03/11/2014  . Pain of right lower extremity 03/11/2014  . Atypical lobular hyperplasia of left breast 02/12/2014  . Fibromyalgia 09/21/2013  . Occlusion and stenosis of carotid artery without mention of cerebral infarction 02/29/2012  . Back abscess 01/22/2012  . Chest pain with moderate risk of acute coronary syndrome 12/09/2009  . Shortness of breath 06/10/2009  . Orthostatic hypotension 01/31/2009  . CAD S/P RCA DES June 2010 12/23/2008  . Anemia 07/21/2008  . Headache(784.0) 07/21/2008  . Hx-TIA (transient ischemic attack) 07/21/2008  . Acute bronchitis 04/20/2008  . Edema 02/27/2008  . Type 2 diabetes mellitus with circulatory disorder ([place]) 07/16/2007  . Depression 07/16/2007  . Dyslipidemia 02/21/2007  . Essential hypertension 02/21/2007  . ASTHMA 02/21/2007  . GERD 02/21/2007  . LOW BACK PAIN 02/21/2007   Past Medical History:  Diagnosis Date  . Abnormal nuclear stress test 12/29/2014  . Allergy   . Anemia, unspecified   . Arthritis   . CAD (coronary artery disease)    sees. Dr. Burt Knack. s/p cath with PCI of RCA (xience des) June 2010. Pt also with LAD and D2 dzs-NL FFR in both areas med Rx   . Carotid artery occlusion   . Cataract    sees Dr. Herbert Deaner   . Depressive disorder, not elsewhere classified   . Duodenal nodule   . Edema   . Gastric polyps    COLON  . GERD (gastroesophageal reflux disease)   . Glaucoma    narrow angle, sees Dr. Herbert Deaner   . Headache(784.0)   . Hearing loss    left ear  . Hemorrhoids   . HSV (herpes simplex virus) anogenital infection 05/2014  . HTN (hypertension)   . Hyperlipidemia   . Low back pain   . Obesity   .  Peripheral vascular disease ([place])   . Stroke [facility])    TIA  "many yrs ago"    . TIA (transient ischemic attack)    also Hx of it.   . Type II or unspecified type diabetes mellitus without mention of complication, not stated as uncontrolled    sees Dr. Carrolyn Meiers     Family History  Problem Relation Age of Onset  . Hypertension Mother   . Hypertension Sister   . Hyperlipidemia Sister   . Hypertension Brother   . Cancer Brother     PROSTATE/LUNG  . Diabetes Brother   . Heart disease Brother     Before age 36 - Bypass  . Varicose Veins Brother   . Cancer Father     Prostate and pancreatic   . Stomach cancer Maternal Grandmother 76  . Heart disease Maternal Grandfather   . Breast cancer Maternal Aunt 63  . Cancer Maternal Aunt     Stomach  . Breast cancer Cousin 62    maternal first cousin  . Prostate cancer Cousin 59  . Breast cancer Cousin 30  . Breast cancer Cousin 40    maternal first cousin's daughter  . Coronary artery disease Neg Hx     premature CAD  . Colon cancer Neg Hx   . Esophageal cancer Neg Hx     Past Surgical History:  Procedure Laterality Date  . ABDOMINAL HYSTERECTOMY  age 60   TAH and USO  Leiomyomata  . ANTERIOR CERVICAL CORPECTOMY N/A 06/11/2014   Procedure: ANTERIOR CERVICAL CORPECTOMY CERVICAL SIX; WITH CERVICAL FIVE TO CERVICAL SEVEN ARTHRODESIS;  Surgeon: Ashok Pall, MD;  Location: [place] [facility];  Service: Neurosurgery;  Laterality: N/A;  . BACK SURGERY  2008   lumb fusion  .  bilateral carpal tunnel release    . BREAST BIOPSY Left 01/26/2014   Procedure: EXCISION LEFT BREAST MASS;  Surgeon: Adin Hector, MD;  Location: [place];  Service: General;  Laterality: Left;  . CARDIAC CATHETERIZATION    . CARDIAC CATHETERIZATION N/A 12/29/2014   Procedure: Left Heart Cath and Coronary Angiography;  Surgeon: Sherren Mocha, MD;  Location: [place] CV [facility];  Service: Cardiovascular;  Laterality: N/A;  . CAROTID ENDARTERECTOMY  march 2012   per Dr. Morton Amy  . CARPAL TUNNEL RELEASE Left 05/14/2014   Procedure: Left Carpal tunnel release;  Surgeon: Ashok Pall, MD;  Location: [place] [facility];  Service: Neurosurgery;  Laterality: Left;  Left Carpal tunnel release  . CHOLECYSTECTOMY    . COLONOSCOPY  05-29-11   per Dr. Henrene Pastor, benign polyps, repeat in 5 yrs    . DILATION AND CURETTAGE OF UTERUS    . ESOPHAGOGASTRODUODENOSCOPY  02-08-06   per Dr. Henrene Pastor, gastritis   . EYE SURGERY Left June 2016   Cataract  . GLAUCOMA SURGERY     with laser, per Dr. Henrene Pastor   . ULNAR NERVE TRANSPOSITION Left 05/14/2014   Procedure: Left Ulnar nerve decompression;  Surgeon: Ashok Pall, MD;  Location: [place] [facility];  Service: Neurosurgery;  Laterality: Left;  Left Ulnar nerve decompression   Social History   Occupational History  . Not on file.   Social History Main Topics  . Smoking status: Former Smoker    Quit date: 01/22/1967  . Smokeless tobacco: Never Used     Comment: non smoker  . Alcohol use No  . Drug use: No  . Sexual activity: No     Comment: HYST-1st intercourse 53 yo-5 partners

## 2016-08-24 ENCOUNTER — Telehealth: Payer: Self-pay | Admitting: *Deleted

## 2016-08-24 DIAGNOSIS — M170 Bilateral primary osteoarthritis of knee: Secondary | ICD-10-CM | POA: Diagnosis not present

## 2016-08-24 NOTE — Telephone Encounter (Signed)
Late entry pt left message in voicemail 08/23/16 in pm regarding vulvar boil, I called patient this am and left her at message to call me back to talk about this and no return call. Pt called front desk asking me to call her again. I called and left message on pt voicemail to call me again.

## 2016-08-30 DIAGNOSIS — M170 Bilateral primary osteoarthritis of knee: Secondary | ICD-10-CM | POA: Diagnosis not present

## 2016-09-10 DIAGNOSIS — M170 Bilateral primary osteoarthritis of knee: Secondary | ICD-10-CM | POA: Diagnosis not present

## 2016-09-13 ENCOUNTER — Ambulatory Visit (INDEPENDENT_AMBULATORY_CARE_PROVIDER_SITE_OTHER): Payer: Medicare Other | Admitting: Specialist

## 2016-09-19 ENCOUNTER — Telehealth (INDEPENDENT_AMBULATORY_CARE_PROVIDER_SITE_OTHER): Payer: Self-pay | Admitting: Specialist

## 2016-09-19 NOTE — Telephone Encounter (Signed)
Patient request a call, states she has question regarding the surgery. She states she left a message two days ago and has not received a call

## 2016-09-21 NOTE — Telephone Encounter (Signed)
Please put patient on cancellation list for sooner appt--currently sched for 10/18/2016, thank you

## 2016-09-25 ENCOUNTER — Telehealth: Payer: Self-pay | Admitting: Cardiovascular Disease

## 2016-09-25 NOTE — Telephone Encounter (Signed)
Called pt and left message informing pt that we had received a request from [employer] stating that pt need a prior auth in order to get name brand of crestor, because her insurance will not pt for it. I gave the prior auth to Dr. Antionette Char nurse, Ander Purpura, RN.

## 2016-09-27 ENCOUNTER — Telehealth: Payer: Self-pay

## 2016-09-27 MED ORDER — ROSUVASTATIN CALCIUM 40 MG PO TABS: 40.0000 mg | ORAL_TABLET | Freq: Every day | ORAL | 3 refills | Status: DC

## 2016-09-27 NOTE — Telephone Encounter (Signed)
**Note De-Identified [name] Obfuscation** The pt states that she has never taken the generic form of Crestor and that she takes Crestor because that is what was given to her when she first started taking it. She states that she is willing to take Rosuvastatin.  I have sent Rosuvastatin 40 mg #90 with 3 refills to [facility] on [address]. The pt is aware.

## 2016-09-27 NOTE — Telephone Encounter (Signed)
New Message     Pt is calling about the Crestor

## 2016-09-27 NOTE — Telephone Encounter (Signed)
**Note De-Identified Kolbee Stallman Obfuscation** LMTCB. We received a fax from [facility] stating that a PA is needed for the pts name brand Crestor. We need to know why pt needs name brand Crestor and her insurance info.

## 2016-10-01 ENCOUNTER — Ambulatory Visit (INDEPENDENT_AMBULATORY_CARE_PROVIDER_SITE_OTHER): Payer: Medicare Other | Admitting: Specialist

## 2016-10-01 ENCOUNTER — Encounter (INDEPENDENT_AMBULATORY_CARE_PROVIDER_SITE_OTHER): Payer: Self-pay | Admitting: Specialist

## 2016-10-01 VITALS — BP 135/68 | HR 70 | Ht 64.0 in | Wt 198.0 lb

## 2016-10-01 DIAGNOSIS — M5416 Radiculopathy, lumbar region: Secondary | ICD-10-CM | POA: Diagnosis not present

## 2016-10-01 DIAGNOSIS — M48062 Spinal stenosis, lumbar region with neurogenic claudication: Secondary | ICD-10-CM

## 2016-10-01 DIAGNOSIS — M4317 Spondylolisthesis, lumbosacral region: Secondary | ICD-10-CM

## 2016-10-01 NOTE — Patient Instructions (Signed)
Avoid bending, stooping and avoid lifting weights greater than 10 lbs. Avoid prolong standing and walking. Order for a new walker with wheels. Surgery scheduling secretary Kandice Hams, will call you in the next week to schedule for surgery.  Surgery recommended is an extenson of lumbar fusion to L5-S1 from previous fusion L4-5 for instability and anterolisthesis L5-S1( slipping of L5 on S1). this would be done with rods, screws and cages with local bone graft and allograft (donor bone graft). Removal of left L5 pedicle screw and decompression of the L3-4 level immediately above the L4-5 fusion where there is lumbar spinal stenosis. Take hydrocodone for for pain. Risk of surgery includes risk of infection 1 in 100 patients, bleeding 1/2% chance you would need a transfusion.   Risk to the nerves is one in 10,000. You will need to use a brace for 3 months and wean from the brace on the 4th month. Expect improved walking and standing tolerance. Expect relief of leg pain but numbness may persist depending on the length and degree of pressure that has been present.

## 2016-10-01 NOTE — Progress Notes (Addendum)
Office Visit Note   Patient: [name]           Date of Birth: 1946-05-24           MRN: [id number] Visit Date: 10/01/2016              Requested by: Karen Morale, MD [place], [place] [address] PCP: Karen Penna, MD   Assessment & Plan: Visit Diagnoses:  1. Spinal stenosis of lumbar region with neurogenic claudication   2. Spondylolisthesis, lumbosacral region   3. Chronic left lumbar radiculopathy   Seen last month with ongoing difficulty with standing or walking any distance greater than 100', has had left leg numbness since prior lumbar fusion surgery with pedicle screws, rods and cages at L4-5. Claudication symptoms have occurred with any standing and walking. MRI with moderately severe spinal stenosis L3-4 above fusion and new grade 1-2 spondylolisthesis L5-S1 with biforamenal stenosis, left L5 retained pedicle screw is positioned within the upper left L5 neuroforamen. I recommend decompression of the stenosis at L3-4 and L5-S1 with fusion for the spondylolisthesis at L5-S1 and removal of the left L5 pedicle screw. Exploration of the left L5 nerve would be a part of the exposure for performing the left L5-S1 TLIF.  She indicates that she has a wedding to attend in mid May and would want intervention scheduled sooner rather than later.    Plan: Avoid bending, stooping and avoid lifting weights greater than 10 lbs. Avoid prolong standing and walking. Order for a new walker with wheels. Surgery scheduling secretary [name], will call you in the next week to schedule for surgery.  Surgery recommended is an extenson of lumbar fusion to L5-S1 from previous fusion L4-5 for instability and anterolisthesis L5-S1( slipping of L5 on S1). this would be done with rods, screws and cages with local bone graft and allograft (donor bone graft). Removal of left L5 pedicle screw and decompression of the L3-4 level immediately above the L4-5 fusion where there is lumbar  spinal stenosis. Take hydrocodone for for pain. Risk of surgery includes risk of infection 1 in 100 patients, bleeding 1/2% chance you would need a transfusion.   Risk to the nerves is one in 10,000. You will need to use a brace for 3 months and wean from the brace on the 4th month. Expect improved walking and standing tolerance. Expect relief of leg pain but numbness may persist depending on the length and degree of pressure that has been present.   Follow-Up Instructions: Return in about 4 weeks (around 10/29/2016).   Orders:  No orders of the defined types were placed in this encounter.  No orders of the defined types were placed in this encounter.     Procedures: No procedures performed   Clinical Data: Findings:  MRI with severe lumbar spinal stenosis L3-4 above L4-5 fusion, bilateral foramenal stenosis L5-S1. Plain radiographs with grade 1 nearly grade 2 spondylolisthesis L5-S1 new since radiographs from 2015. Solid fusion L4-5 with interbody cages L4-5, pedicle screws at L5 with the left screw approximating the upper left L5 neroforamen.    Subjective: Chief Complaint  Patient presents with  . Lower Back - Follow-up    [name] is here to discuss surgery.  She has questions and wants to make sure she understands what is or needs to be done for her back.    Review of Systems  Constitutional: Negative.   HENT: Negative.   Eyes: Negative.   Respiratory: Negative.  Cardiovascular: Negative.   Gastrointestinal: Negative.   Endocrine: Negative.   Genitourinary: Negative.   Musculoskeletal: Negative.   Skin: Negative.   Allergic/Immunologic: Negative.   Neurological: Negative.   Hematological: Negative.   Psychiatric/Behavioral: Negative.      Objective: Vital Signs: BP 135/68 (BP Location: Left Arm, Patient Position: Sitting)   Pulse 70   Ht 5\' 4"  (1.626 m)   Wt 198 lb (89.8 kg)   BMI 33.99 kg/m   Physical Exam  Constitutional: She is oriented  to person, place, and time. She appears well-developed and well-nourished.  HENT:  Head: Normocephalic and atraumatic.  Eyes: EOM are normal. Pupils are equal, round, and reactive to light.  Neck: Normal range of motion. Neck supple.  Pulmonary/Chest: Effort normal and breath sounds normal.  Abdominal: Soft. Bowel sounds are normal.  Neurological: She is alert and oriented to person, place, and time.  Skin: Skin is warm and dry.  Psychiatric: She has a normal mood and affect. Her behavior is normal. Judgment and thought content normal.    Back Exam   Tenderness  The patient is experiencing tenderness in the lumbar.  Range of Motion  Extension: abnormal  Flexion: normal   Muscle Strength  Right Quadriceps:  5/5  Left Quadriceps:  5/5  Right Hamstrings:  5/5  Left Hamstrings:  5/5   Tests  Straight leg raise right: negative Straight leg raise left: negative  Reflexes  Patellar: Hyporeflexic Achilles: Hyporeflexic Babinski's sign: normal   Other  Toe Walk: normal Heel Walk: normal      Specialty Comments:  No specialty comments available.  Imaging: No results found.   PMFS History: Patient Active Problem List   Diagnosis Date Noted  . Genetic testing 09/14/2015  . Atypical ductal hyperplasia of left breast 08/24/2015  . Family history of breast cancer   . Family history of prostate cancer   . Family history of stomach cancer   . Abnormal nuclear stress test 12/29/2014  . Cervical spondylosis with myelopathy and radiculopathy 06/11/2014  . Carpal tunnel syndrome 05/14/2014  . Aftercare following surgery of the circulatory system, [place] 03/11/2014  . Pain of right lower extremity 03/11/2014  . Atypical lobular hyperplasia of left breast 02/12/2014  . Fibromyalgia 09/21/2013  . Occlusion and stenosis of carotid artery without mention of cerebral infarction 02/29/2012  . Back abscess 01/22/2012  . Chest pain with moderate risk of acute coronary syndrome  12/09/2009  . Shortness of breath 06/10/2009  . Orthostatic hypotension 01/31/2009  . CAD S/P RCA DES June 2010 12/23/2008  . Anemia 07/21/2008  . Headache(784.0) 07/21/2008  . Hx-TIA (transient ischemic attack) 07/21/2008  . Acute bronchitis 04/20/2008  . Edema 02/27/2008  . Type 2 diabetes mellitus with circulatory disorder ([place]) 07/16/2007  . Depression 07/16/2007  . Dyslipidemia 02/21/2007  . Essential hypertension 02/21/2007  . ASTHMA 02/21/2007  . GERD 02/21/2007  . LOW BACK PAIN 02/21/2007   Past Medical History:  Diagnosis Date  . Abnormal nuclear stress test 12/29/2014  . Allergy   . Anemia, unspecified   . Arthritis   . CAD (coronary artery disease)    sees. Dr. Burt Knack. s/p cath with PCI of RCA (xience des) June 2010. Pt also with LAD and D2 dzs-NL FFR in both areas med Rx  . Carotid artery occlusion   . Cataract    sees Dr. Herbert Deaner   . Depressive disorder, not elsewhere classified   . Duodenal nodule   . Edema   . Gastric polyps  COLON  . GERD (gastroesophageal reflux disease)   . Glaucoma    narrow angle, sees Dr. Herbert Deaner   . Headache(784.0)   . Hearing loss    left ear  . Hemorrhoids   . HSV (herpes simplex virus) anogenital infection 05/2014  . HTN (hypertension)   . Hyperlipidemia   . Low back pain   . Obesity   . Peripheral vascular disease ([place])   . Stroke [facility])    TIA  "many yrs ago"    . TIA (transient ischemic attack)    also Hx of it.   . Type II or unspecified type diabetes mellitus without mention of complication, not stated as uncontrolled    sees Dr. Carrolyn Meiers     Family History  Problem Relation Age of Onset  . Hypertension Mother   . Hypertension Sister   . Hyperlipidemia Sister   . Hypertension Brother   . Cancer Brother     PROSTATE/LUNG  . Diabetes Brother   . Heart disease Brother     Before age 62 - Bypass  . Varicose Veins Brother   . Cancer Father     Prostate and pancreatic   . Stomach cancer Maternal Grandmother  45  . Heart disease Maternal Grandfather   . Breast cancer Maternal Aunt 69  . Cancer Maternal Aunt     Stomach  . Breast cancer Cousin 55    maternal first cousin  . Prostate cancer Cousin 74  . Breast cancer Cousin 53  . Breast cancer Cousin 40    maternal first cousin's daughter  . Coronary artery disease Neg Hx     premature CAD  . Colon cancer Neg Hx   . Esophageal cancer Neg Hx     Past Surgical History:  Procedure Laterality Date  . ABDOMINAL HYSTERECTOMY  age 62   TAH and USO  Leiomyomata  . ANTERIOR CERVICAL CORPECTOMY N/A 06/11/2014   Procedure: ANTERIOR CERVICAL CORPECTOMY CERVICAL SIX; WITH CERVICAL FIVE TO CERVICAL SEVEN ARTHRODESIS;  Surgeon: Ashok Pall, MD;  Location: [place] [facility];  Service: Neurosurgery;  Laterality: N/A;  . BACK SURGERY  2008   lumb fusion  . bilateral carpal tunnel release    . BREAST BIOPSY Left 01/26/2014   Procedure: EXCISION LEFT BREAST MASS;  Surgeon: Adin Hector, MD;  Location: [place];  Service: General;  Laterality: Left;  . CARDIAC CATHETERIZATION    . CARDIAC CATHETERIZATION N/A 12/29/2014   Procedure: Left Heart Cath and Coronary Angiography;  Surgeon: Sherren Mocha, MD;  Location: [place] [facility];  Service: Cardiovascular;  Laterality: N/A;  . CAROTID ENDARTERECTOMY  march 2012   per Dr. Morton Amy  . CARPAL TUNNEL RELEASE Left 05/14/2014   Procedure: Left Carpal tunnel release;  Surgeon: Ashok Pall, MD;  Location: [place] [facility];  Service: Neurosurgery;  Laterality: Left;  Left Carpal tunnel release  . CHOLECYSTECTOMY    . COLONOSCOPY  05-29-11   per Dr. Henrene Pastor, benign polyps, repeat in 5 yrs    . DILATION AND CURETTAGE OF UTERUS    . ESOPHAGOGASTRODUODENOSCOPY  02-08-06   per Dr. Henrene Pastor, gastritis   . EYE SURGERY Left June 2016   Cataract  . GLAUCOMA SURGERY     with laser, per Dr. Henrene Pastor   . ULNAR NERVE TRANSPOSITION Left 05/14/2014   Procedure: Left Ulnar nerve decompression;  Surgeon: Ashok Pall, MD;  Location: [facility] [facility];  Service: Neurosurgery;  Laterality: Left;  Left Ulnar nerve decompression  Social History   Occupational History  . Not on file.   Social History Main Topics  . Smoking status: Former Smoker    Quit date: 01/22/1967  . Smokeless tobacco: Never Used     Comment: non smoker  . Alcohol use No  . Drug use: No  . Sexual activity: No     Comment: HYST-1st intercourse 78 yo-5 partners

## 2016-10-03 ENCOUNTER — Ambulatory Visit (INDEPENDENT_AMBULATORY_CARE_PROVIDER_SITE_OTHER): Payer: Medicare Other | Admitting: Specialist

## 2016-10-10 ENCOUNTER — Encounter (HOSPITAL_COMMUNITY): Payer: Self-pay

## 2016-10-11 ENCOUNTER — Encounter: Payer: Self-pay | Admitting: Gynecology

## 2016-10-11 ENCOUNTER — Ambulatory Visit (INDEPENDENT_AMBULATORY_CARE_PROVIDER_SITE_OTHER): Payer: Medicare Other | Admitting: Gynecology

## 2016-10-11 ENCOUNTER — Telehealth (INDEPENDENT_AMBULATORY_CARE_PROVIDER_SITE_OTHER): Payer: Self-pay | Admitting: Specialist

## 2016-10-11 VITALS — BP 130/78

## 2016-10-11 DIAGNOSIS — L0232 Furuncle of buttock: Secondary | ICD-10-CM | POA: Diagnosis not present

## 2016-10-11 MED ORDER — DOXYCYCLINE HYCLATE 100 MG PO CAPS: 100.0000 mg | ORAL_CAPSULE | Freq: Two times a day (BID) | ORAL | 0 refills | Status: DC

## 2016-10-11 NOTE — Telephone Encounter (Signed)
Please call ans rescheduled surgery

## 2016-10-11 NOTE — Patient Instructions (Signed)
Take the antibiotic twice daily for 7 days. Call if this persists, worsens or recurs we will have general surgery see you.  Call your orthopedic doctors who are doing your back surgery to see if they do not want to postpone your surgery.

## 2016-10-11 NOTE — Telephone Encounter (Signed)
Per Dr. Louanne Skye, reschedule surgery for two weeks after 04/10.  I called and advised patient rescheduled surgery to 10/30/16.

## 2016-10-11 NOTE — Progress Notes (Signed)
    GINA LEBLOND [birth year] [id number]        71 y.o.  G2P1011 presents with a draining right vulvar boil that started a week or so ago got worse and then drained last night.  History of left mid labia majora boil treated 08/10/2016. No fever or chills/body aches.  Past medical history,surgical history, problem list, medications, allergies, family history and social history were all reviewed and documented in the EPIC chart.  Directed ROS with pertinent positives and negatives documented in the history of present illness/assessment and plan.  Exam: [name] assistant Vitals:   10/11/16 1126  BP: 130/78   General appearance:  Normal External BUS vagina with atrophic changes. 4 cm indurated area at the crease of the right buttocks/thigh area. Small draining area upper thigh. No fluctuant areas palpated. No significant cellulitis or inguinal adenopathy. Bimanual exam without masses or tenderness  Assessment/Plan:  71 y.o. G2P1011 with draining right buttock/thigh abscess that appears to be resolving. Will cover with doxycycline 100 mg twice a day 7 days. If continues or recurs she will call and I will refer to general surgery for management as this is more a buttocks/thigh issue and not GYN. Patient is scheduled for back surgery next week and have asked her to call today to her surgeon to see if they want to postpone her surgery given she is being treated for an abscess and they may not want to do back surgery during this time. She agrees to give them a call.    [name], 11:39 AM 10/11/2016

## 2016-10-11 NOTE — Telephone Encounter (Signed)
Patient called advised she is scheduled for surgery 10/16/16 and went to her GYN because she had a boil ruptured. Patient asked if this will interfere with her surgery Tuesday. The number to contact patient is [phone]

## 2016-10-17 DIAGNOSIS — I10 Essential (primary) hypertension: Secondary | ICD-10-CM | POA: Diagnosis not present

## 2016-10-17 DIAGNOSIS — E784 Other hyperlipidemia: Secondary | ICD-10-CM | POA: Diagnosis not present

## 2016-10-17 DIAGNOSIS — N62 Hypertrophy of breast: Secondary | ICD-10-CM | POA: Diagnosis not present

## 2016-10-17 DIAGNOSIS — Z1389 Encounter for screening for other disorder: Secondary | ICD-10-CM | POA: Diagnosis not present

## 2016-10-17 DIAGNOSIS — E1159 Type 2 diabetes mellitus with other circulatory complications: Secondary | ICD-10-CM | POA: Diagnosis not present

## 2016-10-17 DIAGNOSIS — I2510 Atherosclerotic heart disease of native coronary artery without angina pectoris: Secondary | ICD-10-CM | POA: Diagnosis not present

## 2016-10-17 DIAGNOSIS — Z6833 Body mass index (BMI) 33.0-33.9, adult: Secondary | ICD-10-CM | POA: Diagnosis not present

## 2016-10-17 DIAGNOSIS — E559 Vitamin D deficiency, unspecified: Secondary | ICD-10-CM | POA: Diagnosis not present

## 2016-10-17 DIAGNOSIS — M5416 Radiculopathy, lumbar region: Secondary | ICD-10-CM | POA: Diagnosis not present

## 2016-10-17 DIAGNOSIS — I6523 Occlusion and stenosis of bilateral carotid arteries: Secondary | ICD-10-CM | POA: Diagnosis not present

## 2016-10-18 ENCOUNTER — Ambulatory Visit (INDEPENDENT_AMBULATORY_CARE_PROVIDER_SITE_OTHER): Payer: Medicare Other | Admitting: Specialist

## 2016-10-22 ENCOUNTER — Other Ambulatory Visit (INDEPENDENT_AMBULATORY_CARE_PROVIDER_SITE_OTHER): Payer: Self-pay | Admitting: Specialist

## 2016-10-23 NOTE — Pre-Procedure Instructions (Signed)
[name]  10/23/2016      [facility] ([facility] [place] - [address] [place] ([place]) [place] [address] Phone: [phone] Fax: [phone]    Your procedure is scheduled on Tuesday April 24.  Report to [facility] Admitting at 5:30 A.M.  Call this number if you have problems the morning of surgery:  [phone]   Remember:  Do not eat food or drink liquids after midnight.  Take these medicines the morning of surgery with A SIP OF WATER: metoprolol (Toprol-XL), pantoprazole (protonix), doxycycline (Vibramycin), gabapentin (neurontin), hydralazine (apresoline), isosorbide (Imdur), eye drops  Take all other medications as prescribed except 7 days prior to surgery STOP taking any Aspirin, diclofenac (voltaren), Aleve, Naproxen, Ibuprofen, Motrin, Advil, Goody's, BC's, all herbal medications, fish oil, and all vitamins  . CALL Dr. Forde Dandy Regarding Insulin Patch/Pump    How to Manage Your Diabetes Before and After Surgery  Why is it important to control my blood sugar before and after surgery? . Improving blood sugar levels before and after surgery helps healing and can limit problems. . A way of improving blood sugar control is eating a healthy diet by: o  Eating less sugar and carbohydrates o  Increasing activity/exercise o  Talking with your doctor about reaching your blood sugar goals . High blood sugars (greater than 180 mg/dL) can raise your risk of infections and slow your recovery, so you will need to focus on controlling your diabetes during the weeks before surgery. . Make sure that the doctor who takes care of your diabetes knows about your planned surgery including the date and location.  How do I manage my blood sugar before surgery? . Check your blood sugar at least 4 times a day, starting 2 days before surgery, to make sure that the level is not too high or low. o Check your blood sugar the morning of your  surgery when you wake up and every 2 hours until you get to the Short Stay unit. . If your blood sugar is less than 70 mg/dL, you will need to treat for low blood sugar: o Do not take insulin. o Treat a low blood sugar (less than 70 mg/dL) with  cup of clear juice (cranberry or apple), 4 glucose tablets, OR glucose gel. o Recheck blood sugar in 15 minutes after treatment (to make sure it is greater than 70 mg/dL). If your blood sugar is not greater than 70 mg/dL on recheck, call [phone] for further instructions. . Report your blood sugar to the short stay nurse when you get to Short Stay.  . If you are admitted to the hospital after surgery: o Your blood sugar will be checked by the staff and you will probably be given insulin after surgery (instead of oral diabetes medicines) to make sure you have good blood sugar levels. o The goal for blood sugar control after surgery is 80-180 mg/dL.               Do not wear jewelry, make-up or nail polish.  Do not wear lotions, powders, or perfumes, or deoderant.  Do not shave 48 hours prior to surgery.  Men may shave face and neck.  Do not bring valuables to the hospital.  [facility] is not responsible for any belongings or valuables.  Contacts, dentures or bridgework may not be worn into surgery.  Leave your suitcase in the car.  After surgery it may be brought  to your room.  For patients admitted to the hospital, discharge time will be determined by your treatment team.  Patients discharged the day of surgery will not be allowed to drive home.    Special instructions:    [place]- Preparing For Surgery  Before surgery, you can play an important role. Because skin is not sterile, your skin needs to be as free of germs as possible. You can reduce the number of germs on your skin by washing with CHG (chlorahexidine gluconate) Soap before surgery.  CHG is an antiseptic cleaner which kills germs and bonds with the skin to continue  killing germs even after washing.  Please do not use if you have an allergy to CHG or antibacterial soaps. If your skin becomes reddened/irritated stop using the CHG.  Do not shave (including legs and underarms) for at least 48 hours prior to first CHG shower. It is OK to shave your face.  Please follow these instructions carefully.   1. Shower the NIGHT BEFORE SURGERY and the MORNING OF SURGERY with CHG.   2. If you chose to wash your hair, wash your hair first as usual with your normal shampoo.  3. After you shampoo, rinse your hair and body thoroughly to remove the shampoo.  4. Use CHG as you would any other liquid soap. You can apply CHG directly to the skin and wash gently with a scrungie or a clean washcloth.   5. Apply the CHG Soap to your body ONLY FROM THE NECK DOWN.  Do not use on open wounds or open sores. Avoid contact with your eyes, ears, mouth and genitals (private parts). Wash genitals (private parts) with your normal soap.  6. Wash thoroughly, paying special attention to the area where your surgery will be performed.  7. Thoroughly rinse your body with warm water from the neck down.  8. DO NOT shower/wash with your normal soap after using and rinsing off the CHG Soap.  9. Pat yourself dry with a CLEAN TOWEL.   10. Wear CLEAN PAJAMAS   11. Place CLEAN SHEETS on your bed the night of your first shower and DO NOT SLEEP WITH PETS.    Day of Surgery: Do not apply any deodorants/lotions. Please wear clean clothes to the hospital/surgery center.      Please read over the following fact sheets that you were given. Coughing and Deep Breathing and MRSA Information

## 2016-10-24 ENCOUNTER — Encounter (HOSPITAL_COMMUNITY): Payer: Self-pay

## 2016-10-24 ENCOUNTER — Encounter (HOSPITAL_COMMUNITY)
Admission: RE | Admit: 2016-10-24 | Discharge: 2016-10-24 | Disposition: A | Discharge status: Home / Self Care | Payer: Medicare Other | Source: Ambulatory Visit | Attending: Surgery | Admitting: Surgery

## 2016-10-24 ENCOUNTER — Encounter (HOSPITAL_COMMUNITY)
Admission: RE | Admit: 2016-10-24 | Discharge: 2016-10-24 | Disposition: A | Discharge status: Home / Self Care | Payer: Medicare Other | Source: Ambulatory Visit | Attending: Specialist | Admitting: Specialist

## 2016-10-24 DIAGNOSIS — Z79899 Other long term (current) drug therapy: Secondary | ICD-10-CM | POA: Insufficient documentation

## 2016-10-24 DIAGNOSIS — E785 Hyperlipidemia, unspecified: Secondary | ICD-10-CM | POA: Diagnosis not present

## 2016-10-24 DIAGNOSIS — I10 Essential (primary) hypertension: Secondary | ICD-10-CM | POA: Insufficient documentation

## 2016-10-24 DIAGNOSIS — Z0183 Encounter for blood typing: Secondary | ICD-10-CM | POA: Diagnosis not present

## 2016-10-24 DIAGNOSIS — Z87891 Personal history of nicotine dependence: Secondary | ICD-10-CM | POA: Diagnosis not present

## 2016-10-24 DIAGNOSIS — Z01818 Encounter for other preprocedural examination: Secondary | ICD-10-CM | POA: Diagnosis not present

## 2016-10-24 DIAGNOSIS — Z955 Presence of coronary angioplasty implant and graft: Secondary | ICD-10-CM | POA: Insufficient documentation

## 2016-10-24 DIAGNOSIS — M48061 Spinal stenosis, lumbar region without neurogenic claudication: Secondary | ICD-10-CM | POA: Insufficient documentation

## 2016-10-24 DIAGNOSIS — Z7982 Long term (current) use of aspirin: Secondary | ICD-10-CM | POA: Diagnosis not present

## 2016-10-24 DIAGNOSIS — K219 Gastro-esophageal reflux disease without esophagitis: Secondary | ICD-10-CM | POA: Insufficient documentation

## 2016-10-24 DIAGNOSIS — Z981 Arthrodesis status: Secondary | ICD-10-CM | POA: Diagnosis not present

## 2016-10-24 DIAGNOSIS — E119 Type 2 diabetes mellitus without complications: Secondary | ICD-10-CM | POA: Insufficient documentation

## 2016-10-24 DIAGNOSIS — Z8673 Personal history of transient ischemic attack (TIA), and cerebral infarction without residual deficits: Secondary | ICD-10-CM | POA: Insufficient documentation

## 2016-10-24 DIAGNOSIS — Z01812 Encounter for preprocedural laboratory examination: Secondary | ICD-10-CM | POA: Diagnosis not present

## 2016-10-24 DIAGNOSIS — I2510 Atherosclerotic heart disease of native coronary artery without angina pectoris: Secondary | ICD-10-CM | POA: Insufficient documentation

## 2016-10-24 DIAGNOSIS — H409 Unspecified glaucoma: Secondary | ICD-10-CM | POA: Diagnosis not present

## 2016-10-24 DIAGNOSIS — H9192 Unspecified hearing loss, left ear: Secondary | ICD-10-CM | POA: Insufficient documentation

## 2016-10-24 DIAGNOSIS — Z9641 Presence of insulin pump (external) (internal): Secondary | ICD-10-CM | POA: Diagnosis not present

## 2016-10-24 HISTORY — DX: Fibromyalgia: M79.7

## 2016-10-24 LAB — COMPREHENSIVE METABOLIC PANEL
ALBUMIN: 4 g/dL (ref 3.5–5.0)
ALT: 14 U/L (ref 14–54)
ANION GAP: 10 (ref 5–15)
AST: 20 U/L (ref 15–41)
Alkaline Phosphatase: 73 U/L (ref 38–126)
BILIRUBIN TOTAL: 1 mg/dL (ref 0.3–1.2)
BUN: 15 mg/dL (ref 6–20)
CO2: 25 mmol/L (ref 22–32)
Calcium: 9.3 mg/dL (ref 8.9–10.3)
Chloride: 106 mmol/L (ref 101–111)
Creatinine, Ser: 1.02 mg/dL — ABNORMAL HIGH (ref 0.44–1.00)
GFR calc Af Amer: >60 mL/min (ref >60)
GFR, EST NON AFRICAN AMERICAN: 55 mL/min — AB (ref >60)
Glucose, Bld: 110 mg/dL — ABNORMAL HIGH (ref 65–99)
POTASSIUM: 3.4 mmol/L — AB (ref 3.5–5.1)
Sodium: 141 mmol/L (ref 135–145)
TOTAL PROTEIN: 6.9 g/dL (ref 6.5–8.1)

## 2016-10-24 LAB — CBC
HEMATOCRIT: 39.5 % (ref 36.0–46.0)
Hemoglobin: 12.5 g/dL (ref 12.0–15.0)
MCH: 26.8 pg (ref 26.0–34.0)
MCHC: 31.6 g/dL (ref 30.0–36.0)
MCV: 84.8 fL (ref 78.0–100.0)
PLATELETS: 224 10*3/uL (ref 150–400)
RBC: 4.66 MIL/uL (ref 3.87–5.11)
RDW: 15.5 % (ref 11.5–15.5)
WBC: 9.2 10*3/uL (ref 4.0–10.5)

## 2016-10-24 LAB — URINALYSIS, ROUTINE W REFLEX MICROSCOPIC
BILIRUBIN URINE: NEGATIVE
Glucose, UA: NEGATIVE mg/dL
Hgb urine dipstick: NEGATIVE
KETONES UR: NEGATIVE mg/dL
Nitrite: NEGATIVE
PROTEIN: NEGATIVE mg/dL
Specific Gravity, Urine: 1.016 (ref 1.005–1.030)
pH: 5 (ref 5.0–8.0)

## 2016-10-24 LAB — SURGICAL PCR SCREEN
MRSA, PCR: NEGATIVE
STAPHYLOCOCCUS AUREUS: NEGATIVE

## 2016-10-24 LAB — GLUCOSE, CAPILLARY: Glucose-Capillary: 131 mg/dL — ABNORMAL HIGH (ref 65–99)

## 2016-10-24 LAB — APTT: APTT: 30 s (ref 24–36)

## 2016-10-24 LAB — PROTIME-INR
INR: 1.1
PROTHROMBIN TIME: 14.2 s (ref 11.4–15.2)

## 2016-10-24 NOTE — Progress Notes (Addendum)
PCP - Alysia Penna Cardiologist - Dr. Burt Knack  Chest x-ray - 10/24/2016 EKG - 03/22/16 Stress Test - 2016 ECHO - 2016 Cardiac Cath - 2016   Fasting Blood Sugar - less than 200 per pt, usually around 130 Pt states last A1c around 8 done last week at Dr. Baldwin Crown office. Records requested from MD. Pt uses "insulin patch" that gives her Novolog insulin 24/hr a day. Pt unsure of dose/rate. Pt instructed to call Dr. Baldwin Crown office regarding this and how to manage around surgery as she will be NPO after midnight the night before surgery. Pt states she will call office. Diabetes Coordinator notified of patient coming for surgery. RN to place call to DM coordinator on DOS or place consult DOS. Pager [phone])   Patient denies shortness of breath, fever, cough and chest pain at PAT appointment   Patient verbalized understanding of instructions that was given to them at the PAT appointment. Patient expressed that there were no further questions.  Patient was also instructed that they will need to review over the PAT instructions again at home before the surgery.   Chart forwarded to anesthesia for review d/t insulin pump and cardiac hx

## 2016-10-25 NOTE — Progress Notes (Signed)
Anesthesia Chart Review:  Pt is a 71 year old female scheduled for removal of L5 pedicle screw, extension of fusion L4-5 to L5-S1 with left L5-S1 transforaminal lumbar interbody fusion, explore L5 nerve root, bilateral decompressive laminectomy L3-4 on 10/30/2016 with Basil Dess, M.D.  - PCP is Alysia Penna, MD - Endocrinologist is Reynold Bowen, MD, last office visit 10/17/16.  - Cardiologist is Sherren Mocha, MD, last office visit 03/22/16  PMH includes: CAD (DES to RCA 2010), HTN, DM, hyperlipidemia, TIA, carotid stenosis (s/p R CEA), anemia, glaucoma, GERD. Hearing loss left ear. Former smoker. BMI 34.5. S/p cervical corpectomy, arthrodesis 06/11/14.   Medications include: ASA 81 mg, doxycycline, Lasix, hydralazine, NovoLog (on a pump), irbesartan, Imdur, methyldopa, metoprolol, Protonix, rosuvastatin, spironolactone, toujeo.  Preoperative labs reviewed. Glucose, 110. HbA1c was 8.7 at PCP's office 10/17/16. (By notes, insulin increased at that visit).   EKG 03/22/16: Sinus bradycardia (53 bpm)  Carotid duplex 06/14/16:  1. Patent R CEA site with no R ICA stenosis. 2. Doppler velocities suggest a 1-39% stenosis of the L ICA  Echo 12/30/14:  - Left ventricle: The cavity size was normal. Wall thickness was normal. Systolic function was normal. The estimated ejection fraction was in the range of 60% to 65%. Wall motion was normal; there were no regional wall motion abnormalities. Features are consistent with a pseudonormal left ventricular filling pattern, with concomitant abnormal relaxation and increased filling pressure (grade 2 diastolic dysfunction).  Cardiac cath 12/29/14:  FINAL CONCLUSIONS:  MODERATE NONOBSTRUCTIVE PROXIMAL LAD STENOSIS UNCHANGED FROM 2010 CATH STUDY  CONTINUED PATENCY OF THE RCA STENT  WIDELY PATENT LEFT CIRCUMFLEX  ESSENTIALLY STABLE CORONARY ANATOMY  RECOMMENDATIONS: MEDICAL THERAPY  Pt instructed to contact Dr. Forde Dandy for instructions on managing insulin by  pump and toujeo the night before and morning of surgery.   If glucose acceptable DOS, I anticipate pt can proceed as scheduled.   Willeen Cass, FNP-BC [facility] Short Stay Surgical Center/Anesthesiology Phone: [phone] 10/25/2016 4:36 PM

## 2016-10-29 NOTE — Progress Notes (Signed)
Inpatient Diabetes Program Recommendations  AACE/ADA: New Consensus Statement on Inpatient Glycemic Control (2015)  Target Ranges:  Prepandial:   less than 140 mg/dL      Peak postprandial:   less than 180 mg/dL (1-2 hours)      Critically ill patients:  140 - 180 mg/dL    Called by Karen Malta, RN with Pre-op about this patient around 1pm today.  Per RN, patient uses VGO insulin pump and will be coming to [facility] Short Stay surgical tomorrow AM at 5:30 am for back surgery with Dr. Louanne [name].  Surgery scheduled for 7:30 am until 12pm.  RN told me they advised patient to call Dr. Baldwin [name] office (pt's PCP with [facility]) and ask Dr. Forde [name] what she (the patient) should do with her insulin pump for surgery.  RN told me patient stated she would not call Dr. Baldwin [name] office and that someone needed to call Dr. Forde [name] for her.  I left a message with Dr. Baldwin [name] office around 1:30pm requesting assistance for [name] as far as advice on what to do with pt's VGO insulin pump the day or surgery.  I then called [name] and asked her if she had either called or heard from anyone from Dr. Baldwin [name] office.  [name] was very short and angry with me on the phone and told me she already told the nurses in pre-op she wasn't calling Dr. Baldwin [name] office and that it was my job to figure out what to do with her pump.  After patient stopped yelling at me on the phone, I was able to tell her that I was working on getting a call back from Dr. Baldwin [name] office so I could assist her (the patient) with figuring out what to do with her pump.  Patient abruptly hung up on me shortly after I gave her this information.  I received a call from [name], medical assistant with Dr. Forde [name] around [name] told me that she has gotten in touch with [name] and that she [name]) had given [name] the following instructions for what to do with her DM medications tonight and tomorrow AM: 1. Do Not take Toujeo insulin  10 units QHS tonight (04/23) 2. Put on VGO 40 disposable insulin pump tonight as normal and wear VGO insulin pump to Short-Stay (pre-op) tomorrow AM (04/24).  Pt can wear VGO disposable insulin pump with basal rates running during surgery and resume normal VGO pump functions after surgery.  Karen Leak, RN with Pre-op back around 3:15pm.  Alerted [name] to the above conversation.  [name] to call Dr. Louanne [name] and let him know what pt's PCP (Dr. Forde [name]) wishes for pt to do with her insulin prior to surgery.  Called [name] back around 3:30pm.  Asked pt if she had spoken with [name] from Dr. Baldwin [name] office.  Pt stated she had spoke with [name] and did not have any questions for me.  Encouraged pt to being her VGO disposable insulin pump supplies with her to the hospital as the hospital does not carry any VGO pump supplies.  Will follow up with Dr. Louanne [name] and patient post-op and will help Dr. Louanne [name] place insulin pump orders post-op tomorrow if needed.     --Will follow patient during hospitalization--  Karen Quaker RN, MSN, CDE Diabetes Coordinator Inpatient Glycemic Control Team Team Pager: [phone] (8a-5p)

## 2016-10-29 NOTE — Progress Notes (Signed)
Dm coordinator called and asked that we call Dr Louanne Skye to see if he is ok with starting an insulin drip once the pt  Gets to short stay in the morning. Spoke with Judeen Hammans at Dr Otho Ket office  States she will ask Dr Louanne Skye and call me back.

## 2016-10-29 NOTE — Progress Notes (Signed)
Spoke with DM coor states Dr Jill Poling nurse called and spoke with the pt about what to do with her pump. States that she will place a note with instructions, and they will help Dr Louanne Skye place orders after surgery.

## 2016-10-29 NOTE — Progress Notes (Signed)
Left message with Dr. Baldwin Crown nurse asking her to either call the patient or shortstay with instruction re: insulin pump and tujeo.

## 2016-10-29 NOTE — Anesthesia Preprocedure Evaluation (Addendum)
Anesthesia Evaluation  Patient identified by MRN, date of birth, ID band Patient awake    Reviewed: Allergy & Precautions, H&P , NPO status , Patient's Chart, lab work & pertinent test results  Airway Mallampati: III  TM Distance: >3 FB Neck ROM: Full    Dental no notable dental hx. (+) Teeth Intact, Dental Advisory Given   Pulmonary neg pulmonary ROS, former smoker,    Pulmonary exam normal breath sounds clear to auscultation       Cardiovascular Exercise Tolerance: Good hypertension, Pt. on medications and Pt. on home beta blockers + CAD, + Cardiac Stents and + Peripheral Vascular Disease   Rhythm:Regular Rate:Normal     Neuro/Psych  Headaches, Depression TIAnegative psych ROS   GI/Hepatic Neg liver ROS, GERD  Medicated and Controlled,  Endo/Other  diabetes, Insulin Dependent  Renal/GU negative Renal ROS  negative genitourinary   Musculoskeletal  (+) Arthritis , Osteoarthritis,    Abdominal   Peds  Hematology negative hematology ROS (+) anemia ,   Anesthesia Other Findings   Reproductive/Obstetrics negative OB ROS                            Anesthesia Physical Anesthesia Plan  ASA: III  Anesthesia Plan: General   Post-op Pain Management:    Induction: Intravenous  Airway Management Planned: Oral ETT  Additional Equipment: Arterial line  Intra-op Plan:   Post-operative Plan: Extubation in OR  Informed Consent: I have reviewed the patients History and Physical, chart, labs and discussed the procedure including the risks, benefits and alternatives for the proposed anesthesia with the patient or authorized representative who has indicated his/her understanding and acceptance.   Dental advisory given  Plan Discussed with: CRNA  Anesthesia Plan Comments:         Anesthesia Quick Evaluation

## 2016-10-30 ENCOUNTER — Inpatient Hospital Stay (HOSPITAL_COMMUNITY): Payer: Medicare Other | Admitting: Anesthesiology

## 2016-10-30 ENCOUNTER — Inpatient Hospital Stay (HOSPITAL_COMMUNITY): Payer: Medicare Other | Admitting: Emergency Medicine

## 2016-10-30 ENCOUNTER — Inpatient Hospital Stay (HOSPITAL_COMMUNITY)
Admission: RE | Admit: 2016-10-30 | Discharge: 2016-11-04 | DRG: 459 | Disposition: A | Discharge status: Home Health | Payer: Medicare Other | Source: Ambulatory Visit | Attending: Specialist | Admitting: Specialist

## 2016-10-30 ENCOUNTER — Inpatient Hospital Stay (HOSPITAL_COMMUNITY)
Admission: RE | Disposition: A | Discharge status: Home Health | Payer: Self-pay | Source: Ambulatory Visit | Attending: Specialist

## 2016-10-30 ENCOUNTER — Encounter (HOSPITAL_COMMUNITY): Payer: Self-pay | Admitting: Anesthesiology

## 2016-10-30 ENCOUNTER — Inpatient Hospital Stay (HOSPITAL_COMMUNITY): Payer: Medicare Other

## 2016-10-30 DIAGNOSIS — M4316 Spondylolisthesis, lumbar region: Secondary | ICD-10-CM | POA: Diagnosis present

## 2016-10-30 DIAGNOSIS — Z8249 Family history of ischemic heart disease and other diseases of the circulatory system: Secondary | ICD-10-CM | POA: Diagnosis not present

## 2016-10-30 DIAGNOSIS — Z90710 Acquired absence of both cervix and uterus: Secondary | ICD-10-CM

## 2016-10-30 DIAGNOSIS — D696 Thrombocytopenia, unspecified: Secondary | ICD-10-CM | POA: Diagnosis not present

## 2016-10-30 DIAGNOSIS — E860 Dehydration: Secondary | ICD-10-CM | POA: Diagnosis not present

## 2016-10-30 DIAGNOSIS — R05 Cough: Secondary | ICD-10-CM

## 2016-10-30 DIAGNOSIS — M48061 Spinal stenosis, lumbar region without neurogenic claudication: Secondary | ICD-10-CM | POA: Diagnosis not present

## 2016-10-30 DIAGNOSIS — Z8673 Personal history of transient ischemic attack (TIA), and cerebral infarction without residual deficits: Secondary | ICD-10-CM | POA: Diagnosis not present

## 2016-10-30 DIAGNOSIS — M48062 Spinal stenosis, lumbar region with neurogenic claudication: Principal | ICD-10-CM | POA: Diagnosis present

## 2016-10-30 DIAGNOSIS — I10 Essential (primary) hypertension: Secondary | ICD-10-CM | POA: Diagnosis not present

## 2016-10-30 DIAGNOSIS — D62 Acute posthemorrhagic anemia: Secondary | ICD-10-CM | POA: Diagnosis not present

## 2016-10-30 DIAGNOSIS — M549 Dorsalgia, unspecified: Secondary | ICD-10-CM | POA: Diagnosis not present

## 2016-10-30 DIAGNOSIS — Z9049 Acquired absence of other specified parts of digestive tract: Secondary | ICD-10-CM

## 2016-10-30 DIAGNOSIS — Z419 Encounter for procedure for purposes other than remedying health state, unspecified: Secondary | ICD-10-CM

## 2016-10-30 DIAGNOSIS — E1136 Type 2 diabetes mellitus with diabetic cataract: Secondary | ICD-10-CM | POA: Diagnosis present

## 2016-10-30 DIAGNOSIS — Z800 Family history of malignant neoplasm of digestive organs: Secondary | ICD-10-CM | POA: Diagnosis not present

## 2016-10-30 DIAGNOSIS — H9192 Unspecified hearing loss, left ear: Secondary | ICD-10-CM | POA: Diagnosis present

## 2016-10-30 DIAGNOSIS — M797 Fibromyalgia: Secondary | ICD-10-CM | POA: Diagnosis present

## 2016-10-30 DIAGNOSIS — E1151 Type 2 diabetes mellitus with diabetic peripheral angiopathy without gangrene: Secondary | ICD-10-CM | POA: Diagnosis present

## 2016-10-30 DIAGNOSIS — Z833 Family history of diabetes mellitus: Secondary | ICD-10-CM | POA: Diagnosis not present

## 2016-10-30 DIAGNOSIS — Z955 Presence of coronary angioplasty implant and graft: Secondary | ICD-10-CM

## 2016-10-30 DIAGNOSIS — D500 Iron deficiency anemia secondary to blood loss (chronic): Secondary | ICD-10-CM | POA: Diagnosis not present

## 2016-10-30 DIAGNOSIS — K219 Gastro-esophageal reflux disease without esophagitis: Secondary | ICD-10-CM | POA: Diagnosis present

## 2016-10-30 DIAGNOSIS — Z87891 Personal history of nicotine dependence: Secondary | ICD-10-CM

## 2016-10-30 DIAGNOSIS — Z981 Arthrodesis status: Secondary | ICD-10-CM | POA: Diagnosis not present

## 2016-10-30 DIAGNOSIS — J181 Lobar pneumonia, unspecified organism: Secondary | ICD-10-CM | POA: Diagnosis not present

## 2016-10-30 DIAGNOSIS — E785 Hyperlipidemia, unspecified: Secondary | ICD-10-CM | POA: Diagnosis present

## 2016-10-30 DIAGNOSIS — Z969 Presence of functional implant, unspecified: Secondary | ICD-10-CM

## 2016-10-30 DIAGNOSIS — R5383 Other fatigue: Secondary | ICD-10-CM | POA: Diagnosis not present

## 2016-10-30 DIAGNOSIS — I5032 Chronic diastolic (congestive) heart failure: Secondary | ICD-10-CM | POA: Diagnosis present

## 2016-10-30 DIAGNOSIS — I110 Hypertensive heart disease with heart failure: Secondary | ICD-10-CM | POA: Diagnosis present

## 2016-10-30 DIAGNOSIS — N179 Acute kidney failure, unspecified: Secondary | ICD-10-CM

## 2016-10-30 DIAGNOSIS — D649 Anemia, unspecified: Secondary | ICD-10-CM | POA: Diagnosis not present

## 2016-10-30 DIAGNOSIS — Z803 Family history of malignant neoplasm of breast: Secondary | ICD-10-CM

## 2016-10-30 DIAGNOSIS — E118 Type 2 diabetes mellitus with unspecified complications: Secondary | ICD-10-CM | POA: Diagnosis not present

## 2016-10-30 DIAGNOSIS — D72829 Elevated white blood cell count, unspecified: Secondary | ICD-10-CM | POA: Diagnosis not present

## 2016-10-30 DIAGNOSIS — R918 Other nonspecific abnormal finding of lung field: Secondary | ICD-10-CM | POA: Diagnosis not present

## 2016-10-30 DIAGNOSIS — I5030 Unspecified diastolic (congestive) heart failure: Secondary | ICD-10-CM

## 2016-10-30 DIAGNOSIS — Z794 Long term (current) use of insulin: Secondary | ICD-10-CM | POA: Diagnosis not present

## 2016-10-30 DIAGNOSIS — J9811 Atelectasis: Secondary | ICD-10-CM | POA: Diagnosis not present

## 2016-10-30 DIAGNOSIS — J189 Pneumonia, unspecified organism: Secondary | ICD-10-CM | POA: Diagnosis not present

## 2016-10-30 DIAGNOSIS — M4317 Spondylolisthesis, lumbosacral region: Secondary | ICD-10-CM | POA: Diagnosis not present

## 2016-10-30 DIAGNOSIS — Y95 Nosocomial condition: Secondary | ICD-10-CM | POA: Diagnosis not present

## 2016-10-30 DIAGNOSIS — M5137 Other intervertebral disc degeneration, lumbosacral region: Secondary | ICD-10-CM | POA: Diagnosis present

## 2016-10-30 DIAGNOSIS — Z7982 Long term (current) use of aspirin: Secondary | ICD-10-CM

## 2016-10-30 DIAGNOSIS — R059 Cough, unspecified: Secondary | ICD-10-CM

## 2016-10-30 DIAGNOSIS — M4807 Spinal stenosis, lumbosacral region: Secondary | ICD-10-CM | POA: Diagnosis not present

## 2016-10-30 DIAGNOSIS — I2510 Atherosclerotic heart disease of native coronary artery without angina pectoris: Secondary | ICD-10-CM | POA: Diagnosis present

## 2016-10-30 DIAGNOSIS — Z472 Encounter for removal of internal fixation device: Secondary | ICD-10-CM | POA: Diagnosis not present

## 2016-10-30 HISTORY — PX: POSTERIOR LUMBAR FUSION: SHX6036

## 2016-10-30 HISTORY — PX: HARDWARE REMOVAL: SHX979

## 2016-10-30 LAB — GLUCOSE, CAPILLARY
GLUCOSE-CAPILLARY: 147 mg/dL — AB (ref 65–99)
Glucose-Capillary: 135 mg/dL — ABNORMAL HIGH (ref 65–99)
Glucose-Capillary: 144 mg/dL — ABNORMAL HIGH (ref 65–99)
Glucose-Capillary: 146 mg/dL — ABNORMAL HIGH (ref 65–99)

## 2016-10-30 LAB — POCT I-STAT 4, (NA,K, GLUC, HGB,HCT)
GLUCOSE: 123 mg/dL — AB (ref 65–99)
HCT: 25 % — ABNORMAL LOW (ref 36.0–46.0)
Hemoglobin: 8.5 g/dL — ABNORMAL LOW (ref 12.0–15.0)
POTASSIUM: 3.2 mmol/L — AB (ref 3.5–5.1)
Sodium: 143 mmol/L (ref 135–145)

## 2016-10-30 LAB — PREPARE RBC (CROSSMATCH)

## 2016-10-30 SURGERY — POSTERIOR LUMBAR FUSION 1 WITH HARDWARE REMOVAL
Anesthesia: General | Site: Spine Lumbar

## 2016-10-30 MED ORDER — OXYCODONE HCL 5 MG PO TABS
5.0000 mg | ORAL_TABLET | ORAL | Status: DC | PRN
  Administered 2016-10-30: 10 mg via ORAL
  Filled 2016-10-30 (×2): qty 2

## 2016-10-30 MED ORDER — PHENYLEPHRINE HCL 10 MG/ML IJ SOLN
INTRAVENOUS | Status: DC | PRN
  Administered 2016-10-30: 15 ug/min via INTRAVENOUS

## 2016-10-30 MED ORDER — ONDANSETRON HCL 4 MG/2ML IJ SOLN
INTRAMUSCULAR | Status: AC
  Filled 2016-10-30: qty 2

## 2016-10-30 MED ORDER — FUROSEMIDE 40 MG PO TABS
40.0000 mg | ORAL_TABLET | Freq: Every day | ORAL | Status: DC
  Administered 2016-10-30: 40 mg via ORAL
  Filled 2016-10-30 (×2): qty 1

## 2016-10-30 MED ORDER — MIDAZOLAM HCL 2 MG/2ML IJ SOLN
INTRAMUSCULAR | Status: AC
  Filled 2016-10-30: qty 2

## 2016-10-30 MED ORDER — VALACYCLOVIR HCL 500 MG PO TABS: 500.0000 mg | ORAL_TABLET | Freq: Two times a day (BID) | ORAL | Status: DC

## 2016-10-30 MED ORDER — SODIUM CHLORIDE 0.9 % IV SOLN: 250.0000 mL | INTRAVENOUS | Status: DC

## 2016-10-30 MED ORDER — LIDOCAINE HCL (CARDIAC) 20 MG/ML IV SOLN
INTRAVENOUS | Status: DC | PRN
  Administered 2016-10-30: 60 mg via INTRAVENOUS

## 2016-10-30 MED ORDER — HYDROMORPHONE HCL 1 MG/ML IJ SOLN: 0.2500 mg | INTRAMUSCULAR | Status: DC | PRN

## 2016-10-30 MED ORDER — ONDANSETRON HCL 4 MG PO TABS: 4.0000 mg | ORAL_TABLET | Freq: Four times a day (QID) | ORAL | Status: DC | PRN

## 2016-10-30 MED ORDER — VITAMIN D (ERGOCALCIFEROL) 1.25 MG (50000 UNIT) PO CAPS
50000.0000 [IU] | ORAL_CAPSULE | ORAL | Status: DC
  Administered 2016-11-03: 50000 [IU] via ORAL
  Filled 2016-10-30: qty 1

## 2016-10-30 MED ORDER — ARTIFICIAL TEARS OPHTHALMIC OINT
TOPICAL_OINTMENT | OPHTHALMIC | Status: DC | PRN
  Administered 2016-10-30: 1 via OPHTHALMIC

## 2016-10-30 MED ORDER — ROCURONIUM BROMIDE 100 MG/10ML IV SOLN
INTRAVENOUS | Status: DC | PRN
  Administered 2016-10-30: 50 mg via INTRAVENOUS
  Administered 2016-10-30 (×2): 20 mg via INTRAVENOUS
  Administered 2016-10-30: 10 mg via INTRAVENOUS

## 2016-10-30 MED ORDER — EPHEDRINE SULFATE 50 MG/ML IJ SOLN
INTRAMUSCULAR | Status: DC | PRN
  Administered 2016-10-30 (×3): 10 mg via INTRAVENOUS
  Administered 2016-10-30: 15 mg via INTRAVENOUS

## 2016-10-30 MED ORDER — PHENYLEPHRINE HCL 10 MG/ML IJ SOLN
INTRAMUSCULAR | Status: DC | PRN
  Administered 2016-10-30 (×2): 80 ug via INTRAVENOUS
  Administered 2016-10-30 (×6): 40 ug via INTRAVENOUS
  Administered 2016-10-30: 80 ug via INTRAVENOUS

## 2016-10-30 MED ORDER — ROSUVASTATIN CALCIUM 20 MG PO TABS
40.0000 mg | ORAL_TABLET | Freq: Every day | ORAL | Status: DC
  Administered 2016-10-30 – 2016-11-04 (×6): 40 mg via ORAL
  Filled 2016-10-30 (×6): qty 2

## 2016-10-30 MED ORDER — ACETAMINOPHEN 325 MG PO TABS
650.0000 mg | ORAL_TABLET | ORAL | Status: DC | PRN
  Administered 2016-11-02: 650 mg via ORAL
  Filled 2016-10-30: qty 2

## 2016-10-30 MED ORDER — THROMBIN 20000 UNITS EX SOLR
CUTANEOUS | Status: DC | PRN
  Administered 2016-10-30: 20 mL via TOPICAL

## 2016-10-30 MED ORDER — MENTHOL 3 MG MT LOZG: 1.0000 | LOZENGE | OROMUCOSAL | Status: DC | PRN

## 2016-10-30 MED ORDER — CHLORHEXIDINE GLUCONATE 4 % EX LIQD: 60.0000 mL | Freq: Once | CUTANEOUS | Status: DC

## 2016-10-30 MED ORDER — DOCUSATE SODIUM 100 MG PO CAPS
100.0000 mg | ORAL_CAPSULE | Freq: Two times a day (BID) | ORAL | Status: DC
  Administered 2016-10-30 – 2016-11-04 (×10): 100 mg via ORAL
  Filled 2016-10-30 (×10): qty 1

## 2016-10-30 MED ORDER — METHOCARBAMOL 1000 MG/10ML IJ SOLN
500.0000 mg | Freq: Four times a day (QID) | INTRAVENOUS | Status: DC | PRN
  Filled 2016-10-30: qty 5

## 2016-10-30 MED ORDER — SUGAMMADEX SODIUM 500 MG/5ML IV SOLN
INTRAVENOUS | Status: DC | PRN
  Administered 2016-10-30: 182.2 mg via INTRAVENOUS

## 2016-10-30 MED ORDER — METHOCARBAMOL 500 MG PO TABS
500.0000 mg | ORAL_TABLET | Freq: Four times a day (QID) | ORAL | Status: DC | PRN
  Administered 2016-11-02 – 2016-11-04 (×2): 500 mg via ORAL
  Filled 2016-10-30 (×2): qty 1

## 2016-10-30 MED ORDER — BUPIVACAINE HCL (PF) 0.5 % IJ SOLN
INTRAMUSCULAR | Status: AC
  Filled 2016-10-30: qty 30

## 2016-10-30 MED ORDER — MIDAZOLAM HCL 5 MG/5ML IJ SOLN
INTRAMUSCULAR | Status: DC | PRN
  Administered 2016-10-30: 2 mg via INTRAVENOUS

## 2016-10-30 MED ORDER — 0.9 % SODIUM CHLORIDE (POUR BTL) OPTIME
TOPICAL | Status: DC | PRN
  Administered 2016-10-30: 1000 mL

## 2016-10-30 MED ORDER — SODIUM CHLORIDE 0.9% FLUSH
3.0000 mL | Freq: Two times a day (BID) | INTRAVENOUS | Status: DC
  Administered 2016-11-01 – 2016-11-03 (×3): 3 mL via INTRAVENOUS

## 2016-10-30 MED ORDER — POLYETHYLENE GLYCOL 3350 17 G PO PACK: 17.0000 g | PACK | Freq: Every day | ORAL | Status: DC | PRN

## 2016-10-30 MED ORDER — FLEET ENEMA 7-19 GM/118ML RE ENEM: 1.0000 | ENEMA | Freq: Once | RECTAL | Status: DC | PRN

## 2016-10-30 MED ORDER — LACTATED RINGERS IV SOLN
INTRAVENOUS | Status: DC | PRN
  Administered 2016-10-30 (×3): via INTRAVENOUS

## 2016-10-30 MED ORDER — SODIUM CHLORIDE 0.9 % IV SOLN
INTRAVENOUS | Status: DC
  Administered 2016-10-30 – 2016-10-31 (×2): via INTRAVENOUS

## 2016-10-30 MED ORDER — INSULIN ASPART 100 UNIT/ML ~~LOC~~ SOLN
4.0000 [IU] | Freq: Three times a day (TID) | SUBCUTANEOUS | Status: DC
  Administered 2016-11-01 – 2016-11-04 (×10): 4 [IU] via SUBCUTANEOUS

## 2016-10-30 MED ORDER — PROPOFOL 10 MG/ML IV BOLUS
INTRAVENOUS | Status: DC | PRN
  Administered 2016-10-30: 175 mg via INTRAVENOUS

## 2016-10-30 MED ORDER — ASPIRIN EC 81 MG PO TBEC
81.0000 mg | DELAYED_RELEASE_TABLET | Freq: Every day | ORAL | Status: DC
  Administered 2016-10-30 – 2016-11-04 (×6): 81 mg via ORAL
  Filled 2016-10-30 (×6): qty 1

## 2016-10-30 MED ORDER — ISOSORBIDE MONONITRATE ER 60 MG PO TB24
60.0000 mg | ORAL_TABLET | Freq: Every day | ORAL | Status: DC
  Administered 2016-10-30 – 2016-11-04 (×6): 60 mg via ORAL
  Filled 2016-10-30 (×6): qty 1

## 2016-10-30 MED ORDER — PROPOFOL 10 MG/ML IV BOLUS
INTRAVENOUS | Status: AC
  Filled 2016-10-30: qty 20

## 2016-10-30 MED ORDER — SODIUM CHLORIDE 0.9% FLUSH
3.0000 mL | INTRAVENOUS | Status: DC | PRN
  Administered 2016-11-04 (×2): 3 mL via INTRAVENOUS
  Filled 2016-10-30 (×2): qty 3

## 2016-10-30 MED ORDER — BUPIVACAINE HCL 0.5 % IJ SOLN
INTRAMUSCULAR | Status: DC | PRN
  Administered 2016-10-30: 20 mL

## 2016-10-30 MED ORDER — FENTANYL CITRATE (PF) 100 MCG/2ML IJ SOLN
INTRAMUSCULAR | Status: DC | PRN
  Administered 2016-10-30 (×2): 50 ug via INTRAVENOUS

## 2016-10-30 MED ORDER — SPIRONOLACTONE 25 MG PO TABS
25.0000 mg | ORAL_TABLET | Freq: Every day | ORAL | Status: DC
  Administered 2016-10-30: 25 mg via ORAL
  Filled 2016-10-30 (×2): qty 1

## 2016-10-30 MED ORDER — ALBUMIN HUMAN 5 % IV SOLN
INTRAVENOUS | Status: DC | PRN
  Administered 2016-10-30 (×2): via INTRAVENOUS

## 2016-10-30 MED ORDER — METOPROLOL SUCCINATE ER 100 MG PO TB24
100.0000 mg | ORAL_TABLET | Freq: Every day | ORAL | Status: DC
  Administered 2016-11-01 – 2016-11-04 (×4): 100 mg via ORAL
  Filled 2016-10-30 (×6): qty 1

## 2016-10-30 MED ORDER — BUPIVACAINE LIPOSOME 1.3 % IJ SUSP
20.0000 mL | INTRAMUSCULAR | Status: AC
  Administered 2016-10-30: 20 mL
  Filled 2016-10-30: qty 20

## 2016-10-30 MED ORDER — CEFAZOLIN SODIUM-DEXTROSE 2-4 GM/100ML-% IV SOLN
2.0000 g | Freq: Three times a day (TID) | INTRAVENOUS | Status: AC
  Administered 2016-10-30 – 2016-10-31 (×2): 2 g via INTRAVENOUS
  Filled 2016-10-30 (×2): qty 100

## 2016-10-30 MED ORDER — PANTOPRAZOLE SODIUM 40 MG PO TBEC
40.0000 mg | DELAYED_RELEASE_TABLET | Freq: Every day | ORAL | Status: DC
  Administered 2016-10-30 – 2016-11-04 (×6): 40 mg via ORAL
  Filled 2016-10-30 (×6): qty 1

## 2016-10-30 MED ORDER — METHYLDOPA 250 MG PO TABS
250.0000 mg | ORAL_TABLET | Freq: Two times a day (BID) | ORAL | Status: DC
  Administered 2016-10-30 – 2016-11-04 (×9): 250 mg via ORAL
  Filled 2016-10-30 (×10): qty 1

## 2016-10-30 MED ORDER — GABAPENTIN 100 MG PO CAPS
100.0000 mg | ORAL_CAPSULE | Freq: Every day | ORAL | Status: DC
  Administered 2016-10-30: 100 mg via ORAL
  Filled 2016-10-30 (×2): qty 1

## 2016-10-30 MED ORDER — PANTOPRAZOLE SODIUM 40 MG IV SOLR: 40.0000 mg | Freq: Every day | INTRAVENOUS | Status: DC

## 2016-10-30 MED ORDER — MONTELUKAST SODIUM 10 MG PO TABS
10.0000 mg | ORAL_TABLET | Freq: Every day | ORAL | Status: DC
  Administered 2016-10-30 – 2016-11-04 (×6): 10 mg via ORAL
  Filled 2016-10-30 (×6): qty 1

## 2016-10-30 MED ORDER — BISACODYL 5 MG PO TBEC: 5.0000 mg | DELAYED_RELEASE_TABLET | Freq: Every day | ORAL | Status: DC | PRN

## 2016-10-30 MED ORDER — HYDRALAZINE HCL 50 MG PO TABS
100.0000 mg | ORAL_TABLET | Freq: Three times a day (TID) | ORAL | Status: DC
  Administered 2016-10-30 – 2016-11-04 (×10): 100 mg via ORAL
  Filled 2016-10-30 (×12): qty 2

## 2016-10-30 MED ORDER — PHENOL 1.4 % MT LIQD: 1.0000 | OROMUCOSAL | Status: DC | PRN

## 2016-10-30 MED ORDER — SUGAMMADEX SODIUM 200 MG/2ML IV SOLN
INTRAVENOUS | Status: AC
  Filled 2016-10-30: qty 4

## 2016-10-30 MED ORDER — KETOROLAC TROMETHAMINE 15 MG/ML IJ SOLN
7.5000 mg | Freq: Four times a day (QID) | INTRAMUSCULAR | Status: AC
  Administered 2016-10-30 – 2016-10-31 (×4): 7.5 mg via INTRAVENOUS
  Filled 2016-10-30 (×4): qty 1

## 2016-10-30 MED ORDER — GLYCOPYRROLATE 0.2 MG/ML IJ SOLN
INTRAMUSCULAR | Status: DC | PRN
  Administered 2016-10-30: .2 mg via INTRAVENOUS
  Administered 2016-10-30: 0.2 mg via INTRAVENOUS

## 2016-10-30 MED ORDER — CEFAZOLIN SODIUM-DEXTROSE 2-4 GM/100ML-% IV SOLN
2.0000 g | INTRAVENOUS | Status: AC
  Administered 2016-10-30 (×2): 2 g via INTRAVENOUS
  Filled 2016-10-30: qty 100

## 2016-10-30 MED ORDER — MORPHINE SULFATE (PF) 2 MG/ML IV SOLN: 1.0000 mg | INTRAVENOUS | Status: DC | PRN

## 2016-10-30 MED ORDER — SODIUM BICARBONATE 8.4 % IV SOLN
INTRAVENOUS | Status: AC
  Filled 2016-10-30: qty 50

## 2016-10-30 MED ORDER — INSULIN GLARGINE 100 UNIT/ML ~~LOC~~ SOLN
10.0000 [IU] | Freq: Every day | SUBCUTANEOUS | Status: DC
  Filled 2016-10-30: qty 0.1

## 2016-10-30 MED ORDER — NITROGLYCERIN 0.4 MG SL SUBL: 0.4000 mg | SUBLINGUAL_TABLET | SUBLINGUAL | Status: DC | PRN

## 2016-10-30 MED ORDER — SODIUM CHLORIDE 0.9 % IV SOLN
INTRAVENOUS | Status: DC | PRN
  Administered 2016-10-30: 12:00:00 via INTRAVENOUS

## 2016-10-30 MED ORDER — LATANOPROST 0.005 % OP SOLN
1.0000 [drp] | Freq: Every day | OPHTHALMIC | Status: DC
  Administered 2016-10-30 – 2016-11-04 (×5): 1 [drp] via OPHTHALMIC
  Filled 2016-10-30: qty 2.5

## 2016-10-30 MED ORDER — FENTANYL CITRATE (PF) 250 MCG/5ML IJ SOLN
INTRAMUSCULAR | Status: AC
  Filled 2016-10-30: qty 5

## 2016-10-30 MED ORDER — THROMBIN 20000 UNITS EX SOLR
CUTANEOUS | Status: AC
  Filled 2016-10-30: qty 20000

## 2016-10-30 MED ORDER — ACETAMINOPHEN 650 MG RE SUPP: 650.0000 mg | RECTAL | Status: DC | PRN

## 2016-10-30 MED ORDER — SUGAMMADEX SODIUM 500 MG/5ML IV SOLN
INTRAVENOUS | Status: AC
  Filled 2016-10-30: qty 5

## 2016-10-30 MED ORDER — ONDANSETRON HCL 4 MG/2ML IJ SOLN: 4.0000 mg | Freq: Four times a day (QID) | INTRAMUSCULAR | Status: DC | PRN

## 2016-10-30 MED ORDER — CELECOXIB 200 MG PO CAPS
200.0000 mg | ORAL_CAPSULE | Freq: Two times a day (BID) | ORAL | Status: DC
  Administered 2016-10-30 – 2016-11-01 (×4): 200 mg via ORAL
  Filled 2016-10-30 (×4): qty 1

## 2016-10-30 MED ORDER — IRBESARTAN 300 MG PO TABS
300.0000 mg | ORAL_TABLET | Freq: Every day | ORAL | Status: DC
  Administered 2016-11-01 – 2016-11-04 (×4): 300 mg via ORAL
  Filled 2016-10-30 (×4): qty 1

## 2016-10-30 MED ORDER — INSULIN ASPART 100 UNIT/ML ~~LOC~~ SOLN
0.0000 [IU] | Freq: Three times a day (TID) | SUBCUTANEOUS | Status: DC
  Administered 2016-10-31: 3 [IU] via SUBCUTANEOUS
  Administered 2016-10-31: 2 [IU] via SUBCUTANEOUS
  Administered 2016-10-31: 3 [IU] via SUBCUTANEOUS
  Administered 2016-11-01: 2 [IU] via SUBCUTANEOUS
  Administered 2016-11-01: 5 [IU] via SUBCUTANEOUS
  Administered 2016-11-02: 3 [IU] via SUBCUTANEOUS
  Administered 2016-11-02 (×2): 2 [IU] via SUBCUTANEOUS
  Administered 2016-11-03: 3 [IU] via SUBCUTANEOUS
  Administered 2016-11-03: 5 [IU] via SUBCUTANEOUS
  Administered 2016-11-03 – 2016-11-04 (×3): 3 [IU] via SUBCUTANEOUS

## 2016-10-30 SURGICAL SUPPLY — 77 items
BENZOIN TINCTURE PRP APPL 2/3 (GAUZE/BANDAGES/DRESSINGS) ×4 IMPLANT
BLADE CLIPPER SURG (BLADE) IMPLANT
BONE CANC CHIPS 20CC PCAN1/4 (Bone Implant) ×4 IMPLANT
BONE VIVIGEN FORMABLE 5.4CC (Bone Implant) ×4 IMPLANT
BUR MATCHSTICK NEURO 3.0 LAGG (BURR) ×4 IMPLANT
BUR RND FLUTED 2.5 (BURR) IMPLANT
BUR SABER RD CUTTING 3.0 (BURR) ×3 IMPLANT
BUR SABER RD CUTTING 3.0MM (BURR) ×1
CHIPS CANC BONE 20CC PCAN1/4 (Bone Implant) ×2 IMPLANT
CLOSURE STERI-STRIP 1/2X4 (GAUZE/BANDAGES/DRESSINGS) ×1
CLSR STERI-STRIP ANTIMIC 1/2X4 (GAUZE/BANDAGES/DRESSINGS) ×3 IMPLANT
CONT SPEC 4OZ CLIKSEAL STRL BL (MISCELLANEOUS) ×4 IMPLANT
COVER BACK TABLE 80X110 HD (DRAPES) ×4 IMPLANT
COVER MAYO STAND STRL (DRAPES) ×8 IMPLANT
COVER SURGICAL LIGHT HANDLE (MISCELLANEOUS) ×4 IMPLANT
DERMABOND ADVANCED (GAUZE/BANDAGES/DRESSINGS) ×2
DERMABOND ADVANCED .7 DNX12 (GAUZE/BANDAGES/DRESSINGS) ×2 IMPLANT
DRAPE C-ARM 42X72 X-RAY (DRAPES) ×8 IMPLANT
DRAPE C-ARMOR (DRAPES) ×4 IMPLANT
DRAPE MICROSCOPE LEICA (MISCELLANEOUS) ×4 IMPLANT
DRAPE SURG 17X23 STRL (DRAPES) ×12 IMPLANT
DRSG MEPILEX BORDER 4X4 (GAUZE/BANDAGES/DRESSINGS) IMPLANT
DRSG MEPILEX BORDER 4X8 (GAUZE/BANDAGES/DRESSINGS) ×4 IMPLANT
DRSG PAD ABDOMINAL 8X10 ST (GAUZE/BANDAGES/DRESSINGS) ×4 IMPLANT
DURAPREP 26ML APPLICATOR (WOUND CARE) ×4 IMPLANT
ELECT BLADE 4.0 EZ CLEAN MEGAD (MISCELLANEOUS) ×4
ELECT BLADE 6.5 EXT (BLADE) IMPLANT
ELECT CAUTERY BLADE 6.4 (BLADE) ×8 IMPLANT
ELECT REM PT RETURN 9FT ADLT (ELECTROSURGICAL) ×4
ELECTRODE BLDE 4.0 EZ CLN MEGD (MISCELLANEOUS) ×2 IMPLANT
ELECTRODE REM PT RTRN 9FT ADLT (ELECTROSURGICAL) ×2 IMPLANT
EVACUATOR 1/8 PVC DRAIN (DRAIN) IMPLANT
GAUZE SPONGE 4X4 12PLY STRL (GAUZE/BANDAGES/DRESSINGS) ×4 IMPLANT
GLOVE BIO SURGEON STRL SZ 6.5 (GLOVE) ×6 IMPLANT
GLOVE BIO SURGEONS STRL SZ 6.5 (GLOVE) ×2
GLOVE BIOGEL PI IND STRL 8 (GLOVE) ×2 IMPLANT
GLOVE BIOGEL PI INDICATOR 8 (GLOVE) ×2
GLOVE ECLIPSE 9.0 STRL (GLOVE) ×4 IMPLANT
GLOVE ORTHO TXT STRL SZ7.5 (GLOVE) ×4 IMPLANT
GLOVE SURG 8.5 LATEX PF (GLOVE) ×4 IMPLANT
GOWN STRL REUS W/ TWL LRG LVL3 (GOWN DISPOSABLE) ×2 IMPLANT
GOWN STRL REUS W/TWL 2XL LVL3 (GOWN DISPOSABLE) ×8 IMPLANT
GOWN STRL REUS W/TWL LRG LVL3 (GOWN DISPOSABLE) ×2
HEMOSTAT SURGICEL 2X14 (HEMOSTASIS) IMPLANT
KIT BASIN OR (CUSTOM PROCEDURE TRAY) ×4 IMPLANT
KIT POSITION SURG JACKSON T1 (MISCELLANEOUS) ×4 IMPLANT
KIT ROOM TURNOVER OR (KITS) ×4 IMPLANT
NEEDLE 22X1 1/2 (OR ONLY) (NEEDLE) ×4 IMPLANT
NEEDLE ASP BONE MRW 11GX15 (NEEDLE) IMPLANT
NEEDLE BONE MARROW 8GAX6 (NEEDLE) IMPLANT
NEEDLE SPNL 18GX3.5 QUINCKE PK (NEEDLE) ×4 IMPLANT
NS IRRIG 1000ML POUR BTL (IV SOLUTION) ×4 IMPLANT
PACK LAMINECTOMY ORTHO (CUSTOM PROCEDURE TRAY) ×4 IMPLANT
PAD ARMBOARD 7.5X6 YLW CONV (MISCELLANEOUS) ×8 IMPLANT
PATTIES SURGICAL .75X.75 (GAUZE/BANDAGES/DRESSINGS) ×4 IMPLANT
ROD EXPEDIUM PER BENT 65MM (Rod) ×4 IMPLANT
ROD EXPEDIUM PRE BENT 55MM (Rod) ×8 IMPLANT
SCREW SET SINGLE INNER (Screw) ×20 IMPLANT
SCREW VIPER 7X45MM (Screw) ×12 IMPLANT
SCREW VIPER 8X35 (Screw) ×8 IMPLANT
SPACER CONCORDE PRO 9X11X23MM (Spacer) ×4 IMPLANT
SPONGE LAP 18X18 X RAY DECT (DISPOSABLE) ×4 IMPLANT
SPONGE LAP 4X18 X RAY DECT (DISPOSABLE) IMPLANT
SUT VIC AB 0 CT1 27 (SUTURE) ×4
SUT VIC AB 0 CT1 27XBRD ANBCTR (SUTURE) ×4 IMPLANT
SUT VIC AB 1 CTX 36 (SUTURE) ×4
SUT VIC AB 1 CTX36XBRD ANBCTR (SUTURE) ×4 IMPLANT
SUT VIC AB 2-0 CT1 27 (SUTURE) ×2
SUT VIC AB 2-0 CT1 TAPERPNT 27 (SUTURE) ×2 IMPLANT
SUT VIC AB 3-0 X1 27 (SUTURE) ×4 IMPLANT
SYR 20CC LL (SYRINGE) ×4 IMPLANT
SYR CONTROL 10ML LL (SYRINGE) ×12 IMPLANT
TOWEL OR 17X24 6PK STRL BLUE (TOWEL DISPOSABLE) ×4 IMPLANT
TOWEL OR 17X26 10 PK STRL BLUE (TOWEL DISPOSABLE) ×4 IMPLANT
TRAY FOLEY W/METER SILVER 16FR (SET/KITS/TRAYS/PACK) ×4 IMPLANT
WATER STERILE IRR 1000ML POUR (IV SOLUTION) IMPLANT
YANKAUER SUCT BULB TIP NO VENT (SUCTIONS) ×4 IMPLANT

## 2016-10-30 NOTE — Anesthesia Procedure Notes (Signed)
Procedure Name: Intubation Date/Time: 10/30/2016 7:47 AM Performed by: Neldon Newport Pre-anesthesia Checklist: Timeout performed, Patient being monitored, Suction available, Emergency Drugs available and Patient identified Patient Re-evaluated:Patient Re-evaluated prior to inductionOxygen Delivery Method: Circle system utilized Preoxygenation: Pre-oxygenation with 100% oxygen Intubation Type: IV induction Ventilation: Mask ventilation without difficulty and Oral airway inserted - appropriate to patient size Laryngoscope Size: Mac and 3 Grade View: Grade I Tube type: Oral Tube size: 7.0 mm Number of attempts: 1 Placement Confirmation: breath sounds checked- equal and bilateral,  positive ETCO2 and ETT inserted through vocal cords under direct vision Secured at: 22 cm Dental Injury: Teeth and Oropharynx as per pre-operative assessment

## 2016-10-30 NOTE — Discharge Instructions (Addendum)
Community-Acquired Pneumonia, Adult Pneumonia is an infection of the lungs. One type of pneumonia can happen while a person is in a hospital. A different type can happen when a person is not in a hospital (community-acquired pneumonia). It is easy for this kind to spread from person to person. It can spread to you if you breathe near an infected person who coughs or sneezes. Some symptoms include:  A dry cough.  A wet (productive) cough.  Fever.  Sweating.  Chest pain. Follow these instructions at home:  Take over-the-counter and prescription medicines only as told by your doctor.  Only take cough medicine if you are losing sleep.  If you were prescribed an antibiotic medicine, take it as told by your doctor. Do not stop taking the antibiotic even if you start to feel better.  Sleep with your head and neck raised (elevated). You can do this by putting a few pillows under your head, or you can sleep in a recliner.  Do not use tobacco products. These include cigarettes, chewing tobacco, and e-cigarettes. If you need help quitting, ask your doctor.  Drink enough water to keep your pee (urine) clear or pale yellow. A shot (vaccine) can help prevent pneumonia. Shots are often suggested for:  People older than 69 years of age.  People older than 69 years of age:  Who are having cancer treatment.  Who have long-term (chronic) lung disease.  Who have problems with their body's defense system (immune system). You may also prevent pneumonia if you take these actions:  Get the flu (influenza) shot every year.  Go to the dentist as often as told.  Wash your hands often. If soap and water are not available, use hand sanitizer. Contact a doctor if:  You have a fever.  You lose sleep because your cough medicine does not help. Get help right away if:  You are short of breath and it gets worse.  You have more chest pain.  Your sickness gets worse. This is very serious  if:  You are an older adult.  Your body's defense system is weak.  You cough up blood. This information is not intended to replace advice given to you by your health care provider. Make sure you discuss any questions you have with your health care provider. Document Released: 12/12/2007 Document Revised: 12/01/2015 Document Reviewed: 10/20/2014 Elsevier Interactive Patient Education  2017 [employer]   Bone Grafting, Adult Bone grafting is a surgical procedure to repair damaged bones or joints with another piece of bone or a man-made substitute (bone graft). There are three basic types of bone grafts. You may have a graft from:  Your own bone (autograft). A bone graft is often taken from your hip, rib, or leg.  A healthy donor (allograft). These grafts are frozen and stored for future use at a tissue bank.  A man-made substance similar to bone. These types of grafts do not stimulate new bone growth. They serve as a bridge that new bone can grow into (osteoconduction). You may need a bone graft if your bone broke into many pieces (complex fracture) and is difficult to treat. A bone graft may also be used to fuse two bones together. This is often done in spinal surgery. Bone grafting may also be part of treatment for cancer or for severe injuries that require reconstruction of the head and face. Tell a health care provider about:  Any allergies you have.  All medicines you are taking, including vitamins, herbs,  eye drops, creams, and over-the-counter medicines.  Any problems you or family members have had with anesthetic medicines.  Any blood or bone disorders you have.  Any surgeries you have had.  Any medical conditions you have.  Any recent infections you have had, including skin infections. What are the risks? Generally, this is a safe procedure. However, problems may occur, including:  Infection.  Bleeding.  Delayed healing.  Graft failure, requiring additional  surgery. What happens before the procedure? Staying hydrated  Follow instructions from your health care provider about hydration, which may include:  Up to 2 hours before the procedure - you may continue to drink clear liquids, such as water, clear fruit juice, black coffee, and plain tea. Eating and drinking restrictions  Follow instructions from your health care provider about eating and drinking, which may include:  8 hours before the procedure - stop eating heavy meals or foods such as meat, fried foods, or fatty foods.  6 hours before the procedure - stop eating light meals or foods, such as toast or cereal.  6 hours before the procedure - stop drinking milk or drinks that contain milk.  2 hours before the procedure - stop drinking clear liquids. Medicines   Ask your health care provider about:  Changing or stopping your regular medicines. This is especially important if you are taking diabetes medicines or blood thinners.  Taking medicines such as aspirin and ibuprofen. These medicines can thin your blood. Do not take these medicines before your procedure if your health care provider instructs you not to.  You may be given antibiotic medicine to help prevent infection. General instructions   Do not use any products that contain nicotine or tobacco, such as cigarettes and e-cigarettes. If you need help quitting, ask your health care provider.  Ask your health care provider how your surgical site will be marked or identified.  Plan to have someone take you home after the procedure.  If you will be going home right after the procedure, plan to have someone with you for 24 hours. What happens during the procedure?  To reduce your risk of infection:  Your health care team will wash or sanitize their hands.  Your skin will be washed with soap.  Hair may be removed from the surgical area .  An IV tube will be inserted into one of your veins.  You will be given one or more  of the following:  A medicine to help you relax (sedative).  A medicine to numb the area (local anesthetic).  A medicine to make you fall asleep (general anesthetic).  If you are having an autograft:  An incision will be made over the autograft site. Bone tissue will be removed.  The incision will be closed with stitches (sutures) or staples.  Your surgeon will make an incision to open up the area over the bone where the graft will be attached.  The bone graft will be placed around the bone. It may be held in place with pins, plates, or screws.  Your surgeon will close the incision with sutures or staples.  A bandage (dressing) will be placed over the incision. The procedure may vary among health care providers and hospitals. What happens after the procedure?  Your blood pressure, heart rate, breathing rate, and blood oxygen level will be monitored until the medicines you were given have worn off.  You will be given pain medicine as needed.  You may be sent home with a cast, splint,  or brace.  Your IV tube will be removed, and the insertion site will be checked for bleeding.  Do not drive for 24 hours if you were given a sedative. This information is not intended to replace advice given to you by your health care provider. Make sure you discuss any questions you have with your health care provider. Document Released: 12/15/2001 Document Revised: 03/20/2016 Document Reviewed: 03/20/2016 Elsevier Interactive Patient Education  2017 [employer].   Bone Grafting, Adult, Care After This sheet gives you information about how to care for yourself after your procedure. Your health care provider may also give you more specific instructions. If you have problems or questions, contact your health care provider. What can I expect after the procedure? After the procedure, it is common to have:  Pain.  Bruising.  Swelling.  Stiffness. Follow these instructions at home: If you  have a splint or brace:   Wear the splint or brace as told by your health care provider. Remove it only as told by your health care provider.  Loosen the splint or brace if your fingers or toes tingle, become numb, or turn cold and blue.  Do not let your splint or brace get wet if it is not waterproof.  Keep the splint or brace clean. If you have a cast:   Do not stick anything inside the cast to scratch your skin. Doing that increases your risk of infection.  Check the skin around the cast every day. Tell your health care provider about any concerns.  You may put lotion on dry skin around the edges of the cast. Do not put lotion on the skin underneath the cast.  Do not let your cast get wet if it is not waterproof.  Keep the cast clean. Bathing   Do not take baths, swim, or use a hot tub until your health care provider approves. Ask your health care provider if you can take showers. You may only be allowed to take sponge baths for bathing.  If your cast or splint is not waterproof, cover it with a watertight covering when you take a bath or a shower.  Keep the bandage (dressing) dry until your health care provider says it can be removed. Incision care   Follow instructions from your health care provider about how to take care of your incision. Make sure you:  Wash your hands with soap and water before you change your bandage (dressing). If soap and water are not available, use hand sanitizer.  Change your dressing as told by your health care provider.  Leave stitches (sutures), skin glue, or adhesive strips in place. These skin closures may need to stay in place for 2 weeks or longer. If adhesive strip edges start to loosen and curl up, you may trim the loose edges. Do not remove adhesive strips completely unless your health care provider tells you to do that.  Check your incision area every day for signs of infection. Watch for:  More redness, swelling, or pain.  More fluid  or blood.  Warmth.  Pus or a bad smell. Managing pain, stiffness, and swelling   If directed, apply ice to the injured area:  If you have a removable splint or brace, remove it as told by your health care provider  Put ice in a plastic bag.  Place a towel between your skin and the bag or between your cast and the bag.  Leave the ice on for 20 minutes, 2-3 times per day.  Move your fingers or toes often to avoid stiffness and to lessen swelling.  Raise (elevate) the injured area above the level of your heart while you are sitting or lying down. Driving   Do not drive or use heavy machinery while taking prescription pain medicine.  Do not drive for 24 hours if you were given a medicine to help you relax (sedative).  Ask your health care provider when it is safe to drive if you have a cast or splint on your arm, hand, foot, or leg. General instructions   Return to your normal activities as told by your health care provider. Ask your health care provider what activities are safe for you.  Do not put pressure on any part of the cast or splint until it is fully hardened. This may take several hours.  Do not use any products that contain nicotine or tobacco, such as cigarettes and e-cigarettes. If you need help quitting, ask your health care provider.  Take over-the-counter and prescription medicines only as told by your health care provider.  Keep all follow-up visits as told by your health care provider. This is important. Contact a health care provider if:  You have chills or a fever.  Your pain medicine is not helping.  You have more redness, swelling, or pain around the incision.  You have more fluid or blood coming from the incision.  Your incision feels warm to the touch.  You have pus or a bad smell coming from the incision.  Your cast, splint, or brace:  Is too loose or too tight.  Gets damaged. Get help right away if:  You have pain or swelling that gets  worse.  The back of your lower leg (calf) gets red, warm, painful, or swollen.  You have chest pain.  You have trouble breathing.  You have numbness or tingling. This information is not intended to replace advice given to you by your health care provider. Make sure you discuss any questions you have with your health care provider. Document Released: 11/09/2014 Document Revised: 03/20/2016 Document Reviewed: 03/20/2016 Elsevier Interactive Patient Education  2017 [employer].    Call if there is increasing drainage, fever greater than 101.5, severe head aches, and worsening nausea or light sensitivity. If shortness of breath, bloody cough or chest tightness or pain go to an emergency room. No lifting greater than 10 lbs. Avoid bending, stooping and twisting. Use brace when sitting and out of bed even to go to bathroom. Walk in house for first 2 weeks then may start to get out slowly increasing distances up to one quarter mile by 4-6 weeks post op. After 5 days may shower and change dressing following bathing with shower.When bathing remove the brace shower and replace brace before getting out of the shower. If drainage, keep dry dressing and do not bathe the incision, use an moisture impervious dressing. Please call and return for scheduled follow up appointment 2 weeks from the time of surgery.

## 2016-10-30 NOTE — Progress Notes (Addendum)
Patient refused ordered insulin coverage. Patient states she has her VGO pump and this pump has already administered insulin, states she has supplies at bedside to change pump which she states she must do tonight. Will notify night RN.  Patient states she must remove VGO tonight around 10pm. She states she will place another VGO at night time tomorrow. She states she uses an insulin pump within that span of time to control blood sugar. States that her insulin pump is in her duffel bag that her husband took home.   RN notified husband that insulin pump is in duffel bag per patient. Patient's husband to bring duffel bag back for patient tonight.

## 2016-10-30 NOTE — Op Note (Signed)
10/30/2016  2:27 PM  PATIENT:  [name]  71 y.o. female  MRN: [id number]  OPERATIVE REPORT  PRE-OPERATIVE DIAGNOSIS:  lumbar spinal stenosis L3-4, L5-S1, lumbar spondylolisthesis L5-S1, Left L5 foraminal encroachment with retained pedicle screws  POST-OPERATIVE DIAGNOSIS:  umbar spinal stenosis L3-4, L5-S1, lumbar spondylolisthesis L5-S1, Left L5 foraminal   PROCEDURE:  Procedure(s): Removal of hardware rods and pedicle screws lumbar four, Extension of fusion L4-5 to L5-S1 with reinsertion of screws at L 4, left L5-S1 transforaminal lumbar interbody fusion, Exploration  left L5 nerve root, bilateral decompressive laminectomy L3-4    SURGEON:  Jessy Oto, MD     ASSISTANT:  Benjiman Core, PA-C  (Present throughout the entire procedure and necessary for completion of procedure in a timely manner)     ANESTHESIA:  General,supplemented with local MARCAINE  0.5% 1:1 EXPAREL 1.3% Amount: 30 ml, Dr. Ola Spurr.  COMPLICATIONS:  None.   EBL:750CC  CELL SAVER: 635CC  DRAINS: Foley to SD.    COMPONENTS  Implant Name Type Inv. Item Serial No. Manufacturer Lot No. LRB No. Used  BONE VIVIGEN FORMABLE 5.4CC - [id number] Bone Implant BONE VIVIGEN FORMABLE 5.4CC [id number] [facility]  N/A 1  BONE CANC CHIPS 20CC - [id number] Bone Implant BONE CANC CHIPS 20CC [id number] [employer] [facility]  N/A 1  concorde pro ti 5 deg lordotic 9x11x75m  spacer Spacer   DEPUY SYNTHES [id number]/A 1  SCREW VIPER 8X35 - [id number] SCREW VIPER 8X35  [employer]  N/A 2  SCREW SET SINGLE INNER - [id number] SCREW SET SINGLE INNER  [employer] SP[employer]  N/A 5  ROD EXPEDIUM PRE BENT 55MM - [id number] ROD EXPEDIUM PRE BENT 55MM  [employer]  N/A 1  ROD EXPEDIUM PRE BENT 55MM - [id number] ROD EXPEDIUM PRE BENT 55MM  [employer] SP[employer]  N/A 1  SCREW VIPER 7X45MM - [id number] SCREW VIPER 7X45MM  [employer]  SPINE  N/A 3  ROD EXPEDIUM PER BENT 65MM - [id number] ROD EXPEDIUM PER BENT 65MM   [employer] DE[employer] SPINE   N/A 1  :   PROCEDURE: The patient was met in the holding area, and the appropriate lumbar levels left L5-S1and bilateral L3-4 identified and marked with an "X" and my initials.Patient understands the rationale to perform TLIF at the L5-S1 level to decompress the left L5 nerve root and bilateral L5-S1 and bilateral L3-4 for lateral recess and foramenal stenosis and lumbar spondylolisthesis L5-S1. The patient was then transported to OR and was placed under general anestheticwithout difficulty. The patient received appropriate preoperative antibiotic prophylaxis 2 gm Ancef.  Nursing staff inserted a Foley catheter under sterile conditions. The patient was then turned to a prone position using the JUnionspine frame. PAS. all pressure points well padded the arms at the side to 90 90. Standard prep with DuraPrep solution draped in the usual manner from the lower dorsal spine the mid sacral segment. Iodine Vi-Drape was used and the old incision scar was marked. Time-out procedure was called and correct. Skin in the midline between L3and S2 was then infiltrated with local anesthesia, marcaine 1/2% 1:1 exparel 1.3% total 30 cc used. Incision was then made  extending from L3-S2  through the skin and subcutaneous layers down to the patient's lumbodorsal fascia and spinous processes. The incision then carried sharply excising the supraspinous ligament and then continuing the lateral aspect of the spinous processes of L3,  L4, L5 and S1. Cobb elevator used to carefully elevate the paralumbar muscles off of the posterior elements using electrocautery carefully drilled bleeding and perform dissection of the muscle tissues of the preserving the facet capsule at the L3-4. Continuing the exposure out laterally to expose the lateral margin of the retained pedicle screws and rods at L4-5 and the facet joint line at  L5-S1 and L3-4. Incision was carried in the midline down to the S1 level area bleeders controlled using electrocautery monopolar electrocautery. A cell saver was used. Cerebellar retractor was used for the upper part of the incision and the cerebellar retractor distally. C-arm fluoroscopy was then brought into the field and using C-arm fluoroscopy then a hole made into the medial aspect of the pedicle of S1 observed in the pedicle using C arm at the 5 oclock position on the left S1 pedicle nerve probe initial entry was determined on fluoroscopy to be good position alignment so that a 4.19m tap was passed to 30 mm within the left S1 pedicle to a depth of nearly 35 mm observed on C-arm fluoroscopy to be beyond the midpoint of the lumbar vertebra and then position alignment within the left S1 pedicle this was then removed and the pedicle channel probed demonstrating patency no sign of rupture the cortex of the pedicle. Tapping with a 4 mm screw tap then 5 mm tap, then a 6 mm tap and then a 7 mm tap then 8.0 mm x 35 mm screw was chosen to be placed later following decompression and TLIF placed on the left side at the S1 level. C-arm fluoroscopy was then brought into the field and using C-arm fluoroscopy then a hole made into the posterior medial aspect of the pedicle of right S1 observed in the pedicle using ball tipped nerve hook and hockey stick nerve probe initial entry was determined on fluoroscopy to be good position alignment so that 4.017mtap was then used to tap the right S1 pedicle to a depth of nearly 35 mm observed on C-arm fluoroscopy to be beyond the midpoint of the lumbar vertebra and then position alignment within the right S1 pedicle this was then removed and the pedicle channel probed demonstrating patency no sign of rupture the cortex of the pedicle. Tapping with a 5 mm screw tap then 8.0 mm x 35 mm screw was placed on the right side SI pedicle level.The pedicle screws and rods at the L4-5 level were  found to be [employer] and were removed, later to replace the left L4 and right L4 and L5 pedicle screws with 7.0 mm x 4517mepuy mPACT screws exchanged for the 6.5mm19m45 mm Med[employer] screws that were present.  The left side lamina of  L3 inferiorly partially resected and L5 left lamina also resected with 50% lamina resected at each segment the medial volar inferior 30% of the spinous process of L3 was resected. Leksell rongeur used to resect inferior aspect of the lamina on the left side at the L5 level and partially on the right side at L4 The left medial 30% of the facet of L3-4 and the entire left L5-S1 facet were resected in order to decompress the left side of the lumbar thecal sac at  L3-4 and L5-S1 and decompress the left L3, L4,L5 and S1 neuroforamen. Complete left foramenotomy was carried out with osteotomes and 2mm 35m 3mm k41misons were used for this portion of the decompression and for ease of placement of theTLIF (transforaminal lumbar interbody fusion) at  the left L5-S1 level inferior portions of the lamina and pars were also resected first beginning with the Leksell rongeur and osteotomes and then resecting using 2 and 3 mm Kerrison. . Similarly the right side decompression was carried out with facetectomies were perform on the right partial at both L3-4 and L5-S1 to provide for decompression of the right side L3,L4,L5 and S1 neuroforamen. Continued laminectomy was carried out resecting the central portions of the lamina of L4 and L5 performing foraminotomies on the right side at the L4, L5 and S1levels. The inferior articular process L3 and L5 were partially resected on the right side. The L5 nerve root identified bilaterally at the medial aspect of the L5 pedicle. Superior articular process of L5 was then partially resected on the right side decompressing the right L5 nerve and on the left side providing for exposure of the area just superior to the S1 pedicle for a placement of the left L5-S1  cage. A large amount of hypertrophic ligmentum flavum was found impressing on the right and left lateral recesses at L3-4 and L5-S1 and narrowing the respective L3, L4, L5 and S1neuroforamens. Loupe magnification and headlight were used initially during this portion procedure. Then the OR microscope was sterilely draped and brought into the field for the laminectomies completion at each level.  Attention then turned to placement of the left L5-S1 transforaminal lumbar interbody fusion cage. Using a Penfield 4 the left lateral aspect of the thecal sac at the L5-S1 disc space was carefully freed up The thecal sac could then easily be retracted in the posterior lateral aspect of the L5-S1 disc was exposed 15 blade scalpel used to incise the posterolateral disc and an osteotome used to resect a small portion of bone off the superior aspect of the posterior superior vertebral body of L5 in order to ease the entry into the L5-S1 disc space. A  24m kerrison rongeur was then able to be introduced in the disc space debrided it's edges. 7 mm dilator shaver was used to dialate the L5-S1 disc space on the left side attempts were made to dilate further to 11 mm were successful and using curettes and the disc space was debrided a degenerative disc material present in the endplates debrided to bleeding endplate bone. Shavers were inserted to trial the intervertebral disc space. A 11 mm x 292mConcorde ProTi cage was carefully packed with morcellized bone graft and the been harvested from previous laminotomies.The cage was then inserted with the properly aligned insertion handle.  Additional bone graft was then packed into the intervertebral disc space. Bleeding controlled using bipolar electrocautery thrombin soaked gel cottonoids. Then turned to the right L5-S1 level similarly the exposure the posterior lateral aspect this was carried out using a Penfield 4 bipolar electrocautery to control small bleeders present. Derricho  retractor used to retract the thecal sac and L5 nerve root a 15 blade scalpel was used to incise posterior lateral aspect of the right L5-S1 disc the disc space at this level showed mild narrowing posteriorly was more open superiorly so that an osteotome was used to resect a small portion the posterior superior lip of the vertebral body at S1  in order to gain ease of access into the L5-S1 disc space. The space was debrided of degenerative disc material using pituitary along root the entire disc space was then debrided of degenerative disc material using pituitary rongeurs curettage down to bleeding bone endplates.Currettes were used to debride the disc space and pituitary ronguers  used to remove the loosened debris. This space was then carefully assess using spacers The disc space was packed with vivigen 2, allograft cancellous chips and local morcelized autograft. An 11.82m trial cage provided the best fit, the Concorde ProTi  cage 160mx 2335mas chosen so that the permanent 11.0 mm cage by 23 mm Concorde ProTi cage packed with local bone graft and Vivigen 2 was placed into the intervertebral disc space. The posterior intervertebral disc space was then packed with autogenous local bone graft that been harvested from the central laminectomy. Bleeding controlled using bipolar electrocautery.  Observed on C-arm fluoroscopy to be in good position alignment. The cage at L5-S1 were placed anteriorly as best as possible the keep patient's lordosis that was present. With this then the transforaminal lumbar interbody fusion portion of the case was completed bleeders were controlled using bipolar electrocautery thrombin-soaked Gelfoam were appropriate.Decortication of the facet joints carried out bilateral L5-S1. These were packed with cancellous local bone graft. 2 pedicle screws on the right were placed at L4 and L5, the  viper corticofixation screw on the right S1 was already in place and then each fastener carefully  aligned  to allow for placement of rod. The right side first quarter inch titanium rod was then carefully contoured using the french benders and a precontoured 73m66md. This was then placed into the pedicle screws on the right extending from L4-S1 each of the caps were carefully placed loosely tightened. Attention turned to the left side the Left L4 screw was placed similarly and then screws were carefully adjusted to allow for placement of fixation of the rod a quarter inch 55 mm precontoured titanium rod was then carefully contoured. This was able to be inserted into the left pedicle screw fasteners, Caps onto the L4 fasteners were tightened to 80 foot lbs. Across the left side  L4 to-S1 screw fasteners compression was obtained on the left side between L4 and S1by compressing between the fasteners and tightening the screw caps 85 pounds. Similarly this was done on the right side at L4-5 and L5-S1 obtaining compression and tightened 85 pounds. Irrigation was carried out with copious amounts of saline solution this was done throughout the case. Cell Saver was used during the case. 635 CC of cell saver blood was returned to this patient. Hockey stick neuroprobe was used to probe the neuroforamen bilateral L3, L4, L5 and S1, these were determined to be well decompressed. Permanent C-arm images were obtained in AP and lateral plane and oblique planes. Remaining local bone graft was then applied along both lateral posterior lateral region extending from L5 toS1 facet beds.Gelfoam was then removed spinal canal bilaterally at L3-4 and L5-S1 the lumbodorsal musculature carefully exam debrided of any devitalized tissue following removal of cerebellar retractors, the bleeders were controlled using electrocautery and the area dorsal lumbar muscle were then approximated in the midline with interrupted #1 Vicryl sutures loose the dorsal fascia was reattached to the spinous process of L3 to superiorly and S1 inferiorly this was  done with #1 Vicryl sutures. Subcutaneous layers then approximated using interrupted 0 Vicryl sutures and 2-0 Vicryl sutures. Skin was closed with a running subcutaneous stitch of 4-0 Vicryl Dermabond was applied then MedPlex bandage. All instrument and sponge counts were correct. The patient was then returned to a supine position on her bed reactivated extubated and returned to the recovery room in satisfactory condition.   JameBenjiman Core-C perform the duties of assistant surgeon during this case. He  was present from the beginning of the case to the end of the case assisting in transfer the patient from his stretcher to the OR table and back to the stretcher at the end of the case. Assisted in careful retraction and suction of the laminectomy site delicate neural structures operating under the operating room microscope. He performed closure of the incision from the fascia to the skin applying the dressing.         Jessy Oto  10/30/2016, 2:27 PM

## 2016-10-30 NOTE — Interval H&P Note (Signed)
History and Physical Interval Note:  10/30/2016 7:27 AM  [name]  has presented today for surgery, with the diagnosis of lumbar spinal stenosis L3-4, L5-S1, lumbar spondylolisthesis L5-S1, Left L5 foraminal encroachment with retained pedicle screws  The various methods of treatment have been discussed with the patient and family. After consideration of risks, benefits and other options for treatment, the patient has consented to  Procedure(s): Removal of left L5 pedicle screw, Extension of fusion L4-5 to L5-S1 with left L5-S1 transforaminal lumbar interbody fusion, Explore left L5 nerve root, bilateral decompressive laminectomy L3-4 (N/A) as a surgical intervention .  [name] history has been reviewed, patient examined, no change in status, stable for surgery.  I have reviewed the patient's chart and labs.  Questions were answered to the patient's satisfaction.     Jessy Oto

## 2016-10-30 NOTE — Brief Op Note (Signed)
10/30/2016  2:20 PM  PATIENT:  [name]  71 y.o. female  PRE-OPERATIVE DIAGNOSIS:  lumbar spinal stenosis L3-4, L5-S1, lumbar spondylolisthesis L5-S1, Left L5 foraminal encroachment with retained pedicle screws  POST-OPERATIVE DIAGNOSIS:  umbar spinal stenosis L3-4, L5-S1, lumbar spondylolisthesis L5-S1, Left L5 foraminal   PROCEDURE:  Procedure(s): Removal of left L5 pedicle screw, Extension of fusion L4-5 to L5-S1 with left L5-S1 transforaminal lumbar interbody fusion, Explore left L5 nerve root, bilateral decompressive laminectomy L3-4 (N/A)  SURGEON:  Surgeon(s) and Role:    * Jessy Oto, MD - Primary  PHYSICIAN ASSISTANT: Benjiman Core, PA-C   ANESTHESIA:   local, general and Dr. Ola Spurr  EBL:  Total I/O In: 5462 [I.V.:2300; Blood:635; IV Piggyback:500] Out: 1050 [Urine:300; Blood:750]  BLOOD ADMINISTERED:635 CC CELLSAVER  DRAINS: Urinary Catheter (Foley)   LOCAL MEDICATIONS USED:  MARCAINE  0.5% 1:1 EXPAREL 1.3% Amount: 30 ml  SPECIMEN:  No Specimen  DISPOSITION OF SPECIMEN:  N/A  COUNTS:  YES  TOURNIQUET:  * No tourniquets in log *  DICTATION: .Dragon Dictation  PLAN OF CARE: Admit to inpatient   PATIENT DISPOSITION:  PACU - hemodynamically stable.   Delay start of Pharmacological VTE agent (>24hrs) due to surgical blood loss or risk of bleeding: yes

## 2016-10-30 NOTE — H&P (Signed)
[name] is an 71 y.o. female.   Chief Complaint: back pain and leg pain HPI: patient with hx of lumbar stenosis and above complaint presents for surgical intervention.  Progressively worsening symptoms. Failed conservative treatment.   Past Medical History:  Diagnosis Date  . Abnormal nuclear stress test 12/29/2014  . Allergy   . Anemia, unspecified   . Arthritis   . CAD (coronary artery disease)    sees. Dr. Burt Knack. s/p cath with PCI of RCA (xience des) June 2010. Pt also with LAD and D2 dzs-NL FFR in both areas med Rx  . Carotid artery occlusion   . Cataract    sees Dr. Herbert Deaner   . Depressive disorder, not elsewhere classified   . Duodenal nodule   . Edema   . Fibromyalgia   . Gastric polyps    COLON  . GERD (gastroesophageal reflux disease)   . Glaucoma    narrow angle, sees Dr. Herbert Deaner   . Headache(784.0)   . Hearing loss    left ear  . Hemorrhoids   . HSV (herpes simplex virus) anogenital infection 05/2014  . HTN (hypertension)   . Hyperlipidemia   . Low back pain   . Obesity   . Peripheral vascular disease ([place])   . Stroke [facility])    TIA  "many yrs ago"    . TIA (transient ischemic attack)    also Hx of it.   . Type II or unspecified type diabetes mellitus without mention of complication, not stated as uncontrolled    sees Dr. Carrolyn Meiers     Past Surgical History:  Procedure Laterality Date  . ABDOMINAL HYSTERECTOMY  age 47   TAH and USO  Leiomyomata  . ANTERIOR CERVICAL CORPECTOMY N/A 06/11/2014   Procedure: ANTERIOR CERVICAL CORPECTOMY CERVICAL SIX; WITH CERVICAL FIVE TO CERVICAL SEVEN ARTHRODESIS;  Surgeon: Ashok Pall, MD;  Location: [place] NEURO ORS;  Service: Neurosurgery;  Laterality: N/A;  . BACK SURGERY  2008   lumb fusion  . bilateral carpal tunnel release    . BREAST BIOPSY Left 01/26/2014   Procedure: EXCISION LEFT BREAST MASS;  Surgeon: Adin Hector, MD;  Location: [place];  Service: General;  Laterality: Left;  . CARDIAC  CATHETERIZATION    . CARDIAC CATHETERIZATION N/A 12/29/2014   Procedure: Left Heart Cath and Coronary Angiography;  Surgeon: Sherren Mocha, MD;  Location: [place] CV LAB;  Service: Cardiovascular;  Laterality: N/A;  . CAROTID ENDARTERECTOMY  march 2012   per Dr. Morton Amy  . CARPAL TUNNEL RELEASE Left 05/14/2014   Procedure: Left Carpal tunnel release;  Surgeon: Ashok Pall, MD;  Location: [place] NEURO ORS;  Service: Neurosurgery;  Laterality: Left;  Left Carpal tunnel release  . CHOLECYSTECTOMY    . COLONOSCOPY  05-29-11   per Dr. Henrene Pastor, benign polyps, repeat in 5 yrs    . DILATION AND CURETTAGE OF UTERUS    . ESOPHAGOGASTRODUODENOSCOPY  02-08-06   per Dr. Henrene Pastor, gastritis   . EYE SURGERY Left June 2016   Cataract  . GLAUCOMA SURGERY     with laser, per Dr. Henrene Pastor   . ULNAR NERVE TRANSPOSITION Left 05/14/2014   Procedure: Left Ulnar nerve decompression;  Surgeon: Ashok Pall, MD;  Location: [place] NEURO ORS;  Service: Neurosurgery;  Laterality: Left;  Left Ulnar nerve decompression    Family History  Problem Relation Age of Onset  . Hypertension Mother   . Hypertension Sister   . Hyperlipidemia Sister   . Hypertension  Brother   . Cancer Brother     PROSTATE/LUNG  . Diabetes Brother   . Heart disease Brother     Before age 60 - Bypass  . Varicose Veins Brother   . Cancer Father     Prostate and pancreatic   . Stomach cancer Maternal Grandmother 65  . Heart disease Maternal Grandfather   . Breast cancer Maternal Aunt 55  . Cancer Maternal Aunt     Stomach  . Breast cancer Cousin 65    maternal first cousin  . Prostate cancer Cousin 73  . Breast cancer Cousin 60  . Breast cancer Cousin 40    maternal first cousin's daughter  . Coronary artery disease Neg Hx     premature CAD  . Colon cancer Neg Hx   . Esophageal cancer Neg Hx    Social History:  reports that she quit smoking about 49 years ago. She has never used smokeless tobacco. She reports that she does not drink  alcohol or use drugs.  Allergies:  Allergies  Allergen Reactions  . No Known Allergies     Medications Prior to Admission  Medication Sig Dispense Refill  . aspirin 81 MG tablet Take 81 mg by mouth daily.     . doxycycline (VIBRAMYCIN) 100 MG capsule Take 1 capsule (100 mg total) by mouth 2 (two) times daily. (Patient taking differently: Take 100 mg by mouth daily. ) 14 capsule 0  . ergocalciferol (VITAMIN D2) 50000 UNITS capsule Take 50,000 Units by mouth every Saturday. On Saturday.    . furosemide (LASIX) 40 MG tablet TAKE ONE TABLET BY MOUTH ONCE DAILY 30 tablet 0  . gabapentin (NEURONTIN) 100 MG capsule Take one tablet at night for 7 days then take one tablet BID (Patient taking differently: Take 100 mg by mouth at bedtime. ) 60 capsule 3  . hydrALAZINE (APRESOLINE) 100 MG tablet TAKE ONE TABLET BY MOUTH THREE TIMES DAILY 90 tablet 11  . insulin aspart (NOVOLOG) 100 UNIT/ML injection Inject into the skin 3 (three) times daily with meals. V-Go kit pump    . insulin aspart (NOVOLOG) cartridge Inject into the skin 3 (three) times daily with meals.    . Insulin Glargine (TOUJEO SOLOSTAR) 300 UNIT/ML SOPN Inject 10 Units into the skin at bedtime.    . irbesartan (AVAPRO) 300 MG tablet Take 1 tablet (300 mg total) by mouth daily. 90 tablet 3  . isosorbide mononitrate (IMDUR) 60 MG 24 hr tablet TAKE ONE TABLET BY MOUTH ONCE DAILY 90 tablet 3  . latanoprost (XALATAN) 0.005 % ophthalmic solution Place 1 drop into both eyes at bedtime.     . methyldopa (ALDOMET) 250 MG tablet Take 1 tablet (250 mg total) by mouth 2 (two) times daily. 180 tablet 3  . metoprolol succinate (TOPROL-XL) 100 MG 24 hr tablet TAKE ONE TABLET BY MOUTH ONCE DAILY WITH OR IMMEDIATELY FOLLOWING A MEAL 30 tablet 11  . montelukast (SINGULAIR) 10 MG tablet Take 10 mg by mouth daily.    . naproxen sodium (ANAPROX) 220 MG tablet Take 440 mg by mouth 2 (two) times daily as needed (for pain.).    . pantoprazole (PROTONIX) 40 MG  tablet Take 40 mg by mouth daily.     . rosuvastatin (CRESTOR) 40 MG tablet Take 1 tablet (40 mg total) by mouth daily. 90 tablet 3  . spironolactone (ALDACTONE) 25 MG tablet TAKE ONE TABLET BY MOUTH ONCE DAILY 90 tablet 3  . diclofenac sodium (VOLTAREN) 1 % GEL   Apply 2 g topically 4 (four) times daily as needed (for pain).     . nitroGLYCERIN (NITROSTAT) 0.4 MG SL tablet Place 1 tablet (0.4 mg total) under the tongue every 5 (five) minutes as needed for chest pain. 25 tablet 1  . valACYclovir (VALTREX) 500 MG tablet Take 1 tablet (500 mg total) by mouth 2 (two) times daily. At onset of symptoms for 5 days (Patient taking differently: Take 500 mg by mouth 2 (two) times daily as needed (for flare ups.). ) 10 tablet 5    Results for orders placed or performed during the hospital encounter of 10/30/16 (from the past 48 hour(s))  Glucose, capillary     Status: Abnormal   Collection Time: 10/30/16  6:49 AM  Result Value Ref Range   Glucose-Capillary 147 (H) 65 - 99 mg/dL   No results found.  Review of Systems  Constitutional: Negative.   HENT: Negative.   Eyes: Negative.   Respiratory: Negative.   Cardiovascular: Negative.   Gastrointestinal: Negative.   Genitourinary: Negative.   Musculoskeletal: Positive for back pain.  Skin: Negative.   Neurological: Negative.   Psychiatric/Behavioral: Negative.     Blood pressure (!) 170/59, pulse 63, temperature 98.3 F (36.8 C), temperature source Oral, resp. rate 20, SpO2 100 %. Physical Exam  Constitutional: She is oriented to person, place, and time. She appears well-developed. No distress.  HENT:  Head: Normocephalic and atraumatic.  Eyes: EOM are normal. Pupils are equal, round, and reactive to light.  Neck: Normal range of motion.  GI: She exhibits no distension.  Musculoskeletal: She exhibits tenderness.  Neurological: She is alert and oriented to person, place, and time.  Skin: Skin is warm and dry.  Psychiatric: She has a normal  mood and affect.     Assessment/Plan Lumbar stenosis, low back pain and neurogenic claudication  Will proceed with Removal of left L5 pedicle screw, Extension of fusion L4-5 to L5-S1 with left L5-S1 transforaminal lumbar interbody fusion, Explore left L5 nerve root, bilateral decompressive laminectomy L3-4 As scheduled.  Surgical procedure along with possible recovery time discussed.  All questions answered and wishes to proceed.      Owens, PA-C 10/30/2016, 7:14 AM   

## 2016-10-30 NOTE — Progress Notes (Signed)
Orthopedic Tech Progress Note Patient Details:  [name] 1946-02-19 [id number]  Ortho Devices Ortho Device/Splint Location: [place] Lumbar Brace will be ordered Morning of 10/31/16.  (ortho Tech).   Kristopher Oppenheim 10/30/2016, 6:51 PM

## 2016-10-30 NOTE — Transfer of Care (Signed)
Immediate Anesthesia Transfer of Care Note  Patient: [name]  Procedure(s) Performed: Procedure(s): Removal of hardware rods and pedicle screws lumbar four, Extension of fusion L4-5 to L5-S1 with reinsertion of screws at L 4, left L5-S1 transforaminal lumbar interbody fusion, Exploration  left L5 nerve root, bilateral decompressive laminectomy L3-4 (N/A) HARDWARE REMOVAL  Patient Location: PACU  Anesthesia Type:General  Level of Consciousness: awake, alert  and oriented  Airway & Oxygen Therapy: Patient Spontanous Breathing and Patient connected to face mask oxygen  Post-op Assessment: Report given to RN, Post -op Vital signs reviewed and stable and Patient moving all extremities X 4  Post vital signs: Reviewed and stable  Last Vitals:  Vitals:   10/30/16 0650 10/30/16 1454  BP: (!) 170/59   Pulse: 63   Resp: 20   Temp: 36.8 C (P) 36.9 C    Last Pain:  Vitals:   10/30/16 0650  TempSrc: Oral  PainSc:       Patients Stated Pain Goal: 3 (51/76/16 0737)  Complications: No apparent anesthesia complications

## 2016-10-31 ENCOUNTER — Inpatient Hospital Stay (HOSPITAL_COMMUNITY): Payer: Medicare Other

## 2016-10-31 DIAGNOSIS — N179 Acute kidney failure, unspecified: Secondary | ICD-10-CM

## 2016-10-31 DIAGNOSIS — R5383 Other fatigue: Secondary | ICD-10-CM

## 2016-10-31 DIAGNOSIS — M4316 Spondylolisthesis, lumbar region: Secondary | ICD-10-CM

## 2016-10-31 DIAGNOSIS — E118 Type 2 diabetes mellitus with unspecified complications: Secondary | ICD-10-CM

## 2016-10-31 DIAGNOSIS — R059 Cough, unspecified: Secondary | ICD-10-CM

## 2016-10-31 DIAGNOSIS — I5032 Chronic diastolic (congestive) heart failure: Secondary | ICD-10-CM

## 2016-10-31 DIAGNOSIS — D72829 Elevated white blood cell count, unspecified: Secondary | ICD-10-CM

## 2016-10-31 DIAGNOSIS — I5030 Unspecified diastolic (congestive) heart failure: Secondary | ICD-10-CM

## 2016-10-31 DIAGNOSIS — R05 Cough: Secondary | ICD-10-CM

## 2016-10-31 LAB — BASIC METABOLIC PANEL
ANION GAP: 8 (ref 5–15)
BUN: 16 mg/dL (ref 6–20)
CO2: 26 mmol/L (ref 22–32)
Calcium: 7.9 mg/dL — ABNORMAL LOW (ref 8.9–10.3)
Chloride: 107 mmol/L (ref 101–111)
Creatinine, Ser: 1.34 mg/dL — ABNORMAL HIGH (ref 0.44–1.00)
GFR, EST AFRICAN AMERICAN: 46 mL/min — AB (ref >60)
GFR, EST NON AFRICAN AMERICAN: 40 mL/min — AB (ref >60)
Glucose, Bld: 145 mg/dL — ABNORMAL HIGH (ref 65–99)
POTASSIUM: 3.2 mmol/L — AB (ref 3.5–5.1)
SODIUM: 141 mmol/L (ref 135–145)

## 2016-10-31 LAB — CBC
HEMATOCRIT: 27.6 % — AB (ref 36.0–46.0)
HEMOGLOBIN: 8.9 g/dL — AB (ref 12.0–15.0)
MCH: 27.2 pg (ref 26.0–34.0)
MCHC: 32.2 g/dL (ref 30.0–36.0)
MCV: 84.4 fL (ref 78.0–100.0)
PLATELETS: 147 10*3/uL — AB (ref 150–400)
RBC: 3.27 MIL/uL — ABNORMAL LOW (ref 3.87–5.11)
RDW: 15.7 % — AB (ref 11.5–15.5)
WBC: 16.1 10*3/uL — ABNORMAL HIGH (ref 4.0–10.5)

## 2016-10-31 LAB — CBC WITH DIFFERENTIAL/PLATELET
BASOS ABS: 0 10*3/uL (ref 0.0–0.1)
BASOS PCT: 0 %
EOS PCT: 2 %
Eosinophils Absolute: 0.3 10*3/uL (ref 0.0–0.7)
HCT: 26.8 % — ABNORMAL LOW (ref 36.0–46.0)
Hemoglobin: 8.7 g/dL — ABNORMAL LOW (ref 12.0–15.0)
Lymphocytes Relative: 13 %
Lymphs Abs: 2.1 10*3/uL (ref 0.7–4.0)
MCH: 27.6 pg (ref 26.0–34.0)
MCHC: 32.5 g/dL (ref 30.0–36.0)
MCV: 85.1 fL (ref 78.0–100.0)
MONO ABS: 1.5 10*3/uL — AB (ref 0.1–1.0)
Monocytes Relative: 9 %
Neutro Abs: 12.6 10*3/uL — ABNORMAL HIGH (ref 1.7–7.7)
Neutrophils Relative %: 76 %
PLATELETS: 126 10*3/uL — AB (ref 150–400)
RBC: 3.15 MIL/uL — ABNORMAL LOW (ref 3.87–5.11)
RDW: 15.8 % — AB (ref 11.5–15.5)
WBC: 16.5 10*3/uL — ABNORMAL HIGH (ref 4.0–10.5)

## 2016-10-31 LAB — HEMOGLOBIN A1C
HEMOGLOBIN A1C: 7.9 % — AB (ref 4.8–5.6)
Mean Plasma Glucose: 180 mg/dL

## 2016-10-31 LAB — GLUCOSE, CAPILLARY
GLUCOSE-CAPILLARY: 153 mg/dL — AB (ref 65–99)
GLUCOSE-CAPILLARY: 133 mg/dL — AB (ref 65–99)
GLUCOSE-CAPILLARY: 155 mg/dL — AB (ref 65–99)
Glucose-Capillary: 140 mg/dL — ABNORMAL HIGH (ref 65–99)
Glucose-Capillary: 142 mg/dL — ABNORMAL HIGH (ref 65–99)

## 2016-10-31 LAB — PROCALCITONIN: PROCALCITONIN: 2.36 ng/mL

## 2016-10-31 MED ORDER — INSULIN GLARGINE 100 UNIT/ML ~~LOC~~ SOLN
5.0000 [IU] | Freq: Every day | SUBCUTANEOUS | Status: DC
  Administered 2016-10-31 – 2016-11-03 (×4): 5 [IU] via SUBCUTANEOUS
  Filled 2016-10-31 (×5): qty 0.05

## 2016-10-31 MED ORDER — HYDROCODONE-ACETAMINOPHEN 5-325 MG PO TABS
1.0000 | ORAL_TABLET | Freq: Four times a day (QID) | ORAL | Status: DC | PRN
  Administered 2016-11-01 – 2016-11-04 (×6): 1 via ORAL
  Filled 2016-10-31 (×6): qty 1

## 2016-10-31 MED FILL — Sodium Chloride IV Soln 0.9%: INTRAVENOUS | Qty: 2000 | Status: AC

## 2016-10-31 MED FILL — Heparin Sodium (Porcine) Inj 1000 Unit/ML: INTRAMUSCULAR | Qty: 30 | Status: AC

## 2016-10-31 NOTE — Consult Note (Signed)
Medical Consultation   [name]  WIO:[id number]  DOB: 04/07/1946  DOA: 10/30/2016  PCP: Alysia Penna, MD   Outpatient Specialists: orthopedics, podiatry, ENT, Vascular  Requesting physician: Dr. Louanne Skye - orthopedics  Reason for consultation: Medical management and evaluation of lethargy   History of Present Illness: [name] is an 71 y.o. female presenting with a past medical history of TIA, diabetes, peripheral vascular disease, hyperlipidemia, hypertension, headaches, GERD, fibromyalgia, CAD status post cardiac catheterization with stent placement. Patient admitted to [facility] on 10/30/2016 for elective spinal surgery to include the removal of previous hardware and reinsertion of new hardware. Procedure went well with no perceived complications other than a prolonged time under anesthesia.   Patient somnolent on postop day 1. Evaluated by orthopedic team who was concerned about patient's significant lethargy. At time of my evaluation patient was sleepy but oriented to person place and time. Patient aware of events over the previous day. Patient endorsing lower midline back pain. Patient without any focal complaint at time of my examination including chest pain, palpitations, nausea, vomiting, abdominal pain, dysuria, frequency, diaphoresis, neck stiffness, headache, focal neurological deficit such as unilateral limb weakness or facial asymmetry. In discussing patient's lethargy patient states I like to sleep a lot.    Review of Systems:  ROS As per HPI otherwise 10 point review of systems negative.    Past Medical History: Past Medical History:  Diagnosis Date  . Abnormal nuclear stress test 12/29/2014  . Allergy   . Anemia, unspecified   . Arthritis   . CAD (coronary artery disease)    sees. Dr. Burt Knack. s/p cath with PCI of RCA (xience des) June 2010. Pt also with LAD and D2 dzs-NL FFR in both areas med Rx  . Carotid artery occlusion     . Cataract    sees Dr. Herbert Deaner   . Depressive disorder, not elsewhere classified   . Duodenal nodule   . Edema   . Fibromyalgia   . Gastric polyps    COLON  . GERD (gastroesophageal reflux disease)   . Glaucoma    narrow angle, sees Dr. Herbert Deaner   . Headache(784.0)   . Hearing loss    left ear  . Hemorrhoids   . HSV (herpes simplex virus) anogenital infection 05/2014  . HTN (hypertension)   . Hyperlipidemia   . Low back pain   . Obesity   . Peripheral vascular disease ([place])   . Stroke [facility])    TIA  "many yrs ago"    . TIA (transient ischemic attack)    also Hx of it.   . Type II or unspecified type diabetes mellitus without mention of complication, not stated as uncontrolled    sees Dr. Carrolyn Meiers     Past Surgical History: Past Surgical History:  Procedure Laterality Date  . ABDOMINAL HYSTERECTOMY  age 59   TAH and USO  Leiomyomata  . ANTERIOR CERVICAL CORPECTOMY N/A 06/11/2014   Procedure: ANTERIOR CERVICAL CORPECTOMY CERVICAL SIX; WITH CERVICAL FIVE TO CERVICAL SEVEN ARTHRODESIS;  Surgeon: Ashok Pall, MD;  Location: [place] NEURO ORS;  Service: Neurosurgery;  Laterality: N/A;  . BACK SURGERY  2008   lumb fusion  . BREAST BIOPSY Left 01/26/2014   Procedure: EXCISION LEFT BREAST MASS;  Surgeon: Adin Hector, MD;  Location: [place];  Service: General;  Laterality: Left;  . CARDIAC CATHETERIZATION    .  CARDIAC CATHETERIZATION N/A 12/29/2014   Procedure: Left Heart Cath and Coronary Angiography;  Surgeon: Sherren Mocha, MD;  Location: [place] CV LAB;  Service: Cardiovascular;  Laterality: N/A;  . CAROTID ENDARTERECTOMY  march 2012   per Dr. Morton Amy  . CARPAL TUNNEL RELEASE Left 05/14/2014   Procedure: Left Carpal tunnel release;  Surgeon: Ashok Pall, MD;  Location: [place] NEURO ORS;  Service: Neurosurgery;  Laterality: Left;  Left Carpal tunnel release  . CARPAL TUNNEL RELEASE Bilateral   . CHOLECYSTECTOMY    . COLONOSCOPY  05-29-11   per  Dr. Henrene Pastor, benign polyps, repeat in 5 yrs    . DILATION AND CURETTAGE OF UTERUS    . ESOPHAGOGASTRODUODENOSCOPY  02-08-06   per Dr. Henrene Pastor, gastritis   . EYE SURGERY Left June 2016   Cataract  . GLAUCOMA SURGERY     with laser, per Dr. Henrene Pastor   . LUMBAR FUSION  2008  . POSTERIOR LUMBAR FUSION  10/30/2016   Removal of hardware rods and pedicle screws lumbar four, Extension of fusion L4-5 to L5-S1 with reinsertion of screws at L 4, left L5-S1 transforaminal lumbar interbody fusion, Exploration  left L5 nerve root, bilateral decompressive laminectomy L3-4/notes 10/30/2016  . ULNAR NERVE TRANSPOSITION Left 05/14/2014   Procedure: Left Ulnar nerve decompression;  Surgeon: Ashok Pall, MD;  Location: [place] NEURO ORS;  Service: Neurosurgery;  Laterality: Left;  Left Ulnar nerve decompression     Allergies:   Allergies  Allergen Reactions  . No Known Allergies      Social History:  reports that she quit smoking about 49 years ago. She has never used smokeless tobacco. She reports that she does not drink alcohol or use drugs.   Family History: Family History  Problem Relation Age of Onset  . Hypertension Mother   . Hypertension Sister   . Hyperlipidemia Sister   . Hypertension Brother   . Cancer Brother     PROSTATE/LUNG  . Diabetes Brother   . Heart disease Brother     Before age 27 - Bypass  . Varicose Veins Brother   . Cancer Father     Prostate and pancreatic   . Stomach cancer Maternal Grandmother 58  . Heart disease Maternal Grandfather   . Breast cancer Maternal Aunt 44  . Cancer Maternal Aunt     Stomach  . Breast cancer Cousin 32    maternal first cousin  . Prostate cancer Cousin 75  . Breast cancer Cousin 35  . Breast cancer Cousin 40    maternal first cousin's daughter  . Coronary artery disease Neg Hx     premature CAD  . Colon cancer Neg Hx   . Esophageal cancer Neg Hx      Physical Exam: Vitals:   10/31/16 0100 10/31/16 0556 10/31/16 0741 10/31/16 0906   BP: (!) 155/65 (!) 108/53 (!) 148/50 (!) 115/43  Pulse: 65 72  67  Resp: 20 20 16 16   Temp: 98.7 F (37.1 C) (!) 101.4 F (38.6 C) 99.4 F (37.4 C) 99 F (37.2 C)  TempSrc: Oral Oral Oral Oral  SpO2: 100% 97% 98% 92%  Weight:      Height:        General:  Appears calm and comfortable Eyes:  PERRL, EOMI, normal lids, iris ENT:  grossly normal hearing, lips & tongue, mmm Neck:  no LAD, masses or thyromegaly Cardiovascular:  RRR, no m/r/g. 1+LE edema.  Respiratory:  CTA bilaterally, no w/r/r. Normal respiratory effort. Abdomen:  soft, ntnd, NABS Skin:  no rash or induration seen on limited exam Musculoskeletal:  grossly normal tone BUE/BLE, good ROM of UE. Limited exam due to physical restrictions from surgery Psychiatric:  grossly normal mood and affect, speech fluent and appropriate, AOx3 Neurologic:  CN 2-12 grossly intact, moves all extremities in coordinated fashion, sensation intact  Data reviewed:  I have personally reviewed following labs and imaging studies Labs:  CBC:  Recent Labs Lab 10/30/16 1322 10/31/16 0924  WBC  --  16.1*  HGB 8.5* 8.9*  HCT 25.0* 27.6*  MCV  --  84.4  PLT  --  147*    Basic Metabolic Panel:  Recent Labs Lab 10/30/16 1322 10/31/16 0924  NA 143 141  K 3.2* 3.2*  CL  --  107  CO2  --  26  GLUCOSE 123* 145*  BUN  --  16  CREATININE  --  1.34*  CALCIUM  --  7.9*   GFR Estimated Creatinine Clearance: 44 mL/min (A) (by C-G formula based on SCr of 1.34 mg/dL (H)). Liver Function Tests: No results for input(s): AST, ALT, ALKPHOS, BILITOT, PROT, ALBUMIN in the last 168 hours. No results for input(s): LIPASE, AMYLASE in the last 168 hours. No results for input(s): AMMONIA in the last 168 hours. Coagulation profile No results for input(s): INR, PROTIME in the last 168 hours.  Cardiac Enzymes: No results for input(s): CKTOTAL, CKMB, CKMBINDEX, TROPONINI in the last 168 hours. BNP: Invalid input(s): POCBNP CBG:  Recent  Labs Lab 10/30/16 1742 10/30/16 2045 10/31/16 0631 10/31/16 0935 10/31/16 1109  GLUCAP 144* 146* 153* 140* 133*   D-Dimer No results for input(s): DDIMER in the last 72 hours. Hgb A1c  Recent Labs  10/30/16 1641  HGBA1C 7.9*   Lipid Profile No results for input(s): CHOL, HDL, LDLCALC, TRIG, CHOLHDL, LDLDIRECT in the last 72 hours. Thyroid function studies No results for input(s): TSH, T4TOTAL, T3FREE, THYROIDAB in the last 72 hours.  Invalid input(s): FREET3 Anemia work up No results for input(s): VITAMINB12, FOLATE, FERRITIN, TIBC, IRON, RETICCTPCT in the last 72 hours. Urinalysis    Component Value Date/Time   COLORURINE YELLOW 10/24/2016 0841   APPEARANCEUR CLOUDY (A) 10/24/2016 0841   LABSPEC 1.016 10/24/2016 0841   PHURINE 5.0 10/24/2016 0841   GLUCOSEU NEGATIVE 10/24/2016 0841   HGBUR NEGATIVE 10/24/2016 0841   BILIRUBINUR NEGATIVE 10/24/2016 0841   BILIRUBINUR n 12/28/2014 1129   KETONESUR NEGATIVE 10/24/2016 0841   PROTEINUR NEGATIVE 10/24/2016 0841   UROBILINOGEN 1.0 12/28/2014 1129   UROBILINOGEN 0.2 12/13/2008 1525   NITRITE NEGATIVE 10/24/2016 0841   LEUKOCYTESUR TRACE (A) 10/24/2016 0841     Microbiology Recent Results (from the past 240 hour(s))  Surgical pcr screen     Status: None   Collection Time: 10/24/16  9:10 AM  Result Value Ref Range Status   MRSA, PCR NEGATIVE NEGATIVE Final   Staphylococcus aureus NEGATIVE NEGATIVE Final    Comment:        The Xpert SA Assay (FDA approved for NASAL specimens in patients over 2 years of age), is one component of a comprehensive surveillance program.  Test performance has been validated by [facility] for patients greater than or equal to 59 year old. It is not intended to diagnose infection nor to guide or monitor treatment.        Inpatient Medications:   Scheduled Meds: . aspirin EC  81 mg Oral Daily  . celecoxib  200 mg Oral Q12H  . docusate  sodium  100 mg Oral BID  . furosemide   40 mg Oral Daily  . gabapentin  100 mg Oral QHS  . hydrALAZINE  100 mg Oral TID  . insulin aspart  0-15 Units Subcutaneous TID WC  . insulin aspart  4 Units Subcutaneous TID WC  . insulin glargine  5 Units Subcutaneous QHS  . irbesartan  300 mg Oral Daily  . isosorbide mononitrate  60 mg Oral Daily  . ketorolac  7.5 mg Intravenous Q6H  . latanoprost  1 drop Both Eyes QHS  . methyldopa  250 mg Oral BID  . metoprolol succinate  100 mg Oral Daily  . montelukast  10 mg Oral Daily  . pantoprazole  40 mg Oral Daily  . rosuvastatin  40 mg Oral Daily  . sodium chloride flush  3 mL Intravenous Q12H  . spironolactone  25 mg Oral Daily  . [START ON 11/03/2016] Vitamin D (Ergocalciferol)  50,000 Units Oral Q Sat   Continuous Infusions: . sodium chloride    . sodium chloride 100 mL/hr at 10/31/16 0357  . methocarbamol (ROBAXIN)  IV       Radiological Exams on Admission: Dg Lumbar Spine Complete  Result Date: 10/30/2016 CLINICAL DATA:  Lumbar spine surgery. EXAM: LUMBAR SPINE - COMPLETE 4+ VIEW; DG C-ARM 61-120 MIN COMPARISON:  08/21/2016 04/12/2016. FINDINGS: Lumbar vertebra numbered with the lowest segmented appearing lumbar shaped vertebral bodies L5. Postsurgical changes lumbar spine with L4 through S1 posterior and interbody fusion. Hardware intact. Anatomic alignment. Four images obtained. 1 minutes 50 seconds fluoroscopy time utilized . IMPRESSION: Postsurgical changes lumbosacral spine. Electronically Signed   By: Marcello Moores  Register   On: 10/30/2016 14:35   Dg C-arm 61-120 Min  Result Date: 10/30/2016 CLINICAL DATA:  Lumbar spine surgery. EXAM: LUMBAR SPINE - COMPLETE 4+ VIEW; DG C-ARM 61-120 MIN COMPARISON:  08/21/2016 04/12/2016. FINDINGS: Lumbar vertebra numbered with the lowest segmented appearing lumbar shaped vertebral bodies L5. Postsurgical changes lumbar spine with L4 through S1 posterior and interbody fusion. Hardware intact. Anatomic alignment. Four images obtained. 1 minutes 50  seconds fluoroscopy time utilized . IMPRESSION: Postsurgical changes lumbosacral spine. Electronically Signed   By: Marcello Moores  Register   On: 10/30/2016 14:35    Impression/Recommendations Principal Problem:   Spondylolisthesis, lumbar region Active Problems:   Spinal stenosis of lumbar region   Retained orthopedic hardware   Spinal stenosis of lumbar region with neurogenic claudication   Lethargy   AKI (acute kidney injury) ([place])   Chronic diastolic CHF (congestive heart failure) ([place])  Spinal stenosis of lumbar region: Patient is POD#1 after removal and replacement of orthopedic hardware in lumbar spinal region. Pain is well controlled per patient - Continue management per primary team  Lethargy: Etiology not immediately clear. Patient initially difficult to arouse at time of evaluation by primary team on date of consultation. The time of my evaluation patient is definitely sleepy but oriented to person place and time and without focal complaint. Suspect combination of opioid administration and prolonged anesthesia in the setting of AK I. Pt opioid naive prior to surgery. Pt may also have element of OSA resulting in hypoxemia overnight. Pt more alert as the day goes on. Less likely to be due to infection, seizure, CVA. Elevated white count noted at 16.1 and overnight fever to 101.4 but likely reactive due to surgery. On Ancef. Patient with slight cough but afebrile and without respiratory distress. Neurological exam outside of general fatigue is unremarkable. Metabolic panel reassuring. - CXR, procalcitonin -  Daily CBC and chemistries - IVF - Agree w/ change from oxycodone 10 to [place]. DC morphine - Use robaxin sparingly - BCX if develops another fever - O2 PRN w/ pulse ox continuous - consider DC neurontin if continues to be lethargic. - consider EEG if remains lethargic  AKI: Cr 1.34. Baseline 0.9. Likely from hypoperfusion and dehydration from surgery.  - IVF - BMET in am -  Hold lasix and spironolactone, ibasartan until 7/94  Diastolic CHF: well compensated. EF 60% w/ Grade 2 diastolic dysfunction - Hold spironolactone and lasix until 4/26 - Strict I/O, Daily wts.  - continue bblocker  DM:  - SSI - continue Lantus at half dose  CAD/TIA: - continue ASA, Crestor  Thank you for this consultation.  Our [facility] hospitalist team will follow the patient with you.     Shakesha Soltau J M.D. [facility] 10/31/2016, 11:22 AM

## 2016-10-31 NOTE — Progress Notes (Addendum)
     Subjective: 1 Day Post-Op Procedure(s) (LRB): Removal of hardware rods and pedicle screws lumbar four, Extension of fusion L4-5 to L5-S1 with reinsertion of screws at L 4, left L5-S1 transforaminal lumbar interbody fusion, Exploration  left L5 nerve root, bilateral decompressive laminectomy L3-4 (N/A) HARDWARE REMOVAL Awake, mental status improved, she is alert, changed from oxycodone to hydrocodone and discontinued morphine sulfate, She has not received any post op. Notes leg pain is gone and she has been able to stand and walk a few feet.  Patient reports pain as moderate.  I appreciate Family Medicine Consultant Dr. Baker Janus input.  Objective:   VITALS:  Temp:  [97.6 F (36.4 C)-101.4 F (38.6 C)] 97.6 F (36.4 C) (04/25 1318) Pulse Rate:  [62-72] 65 (04/25 1318) Resp:  [16-20] 16 (04/25 1318) BP: (108-165)/(33-69) 145/45 (04/25 1640) SpO2:  [92 %-100 %] 95 % (04/25 1318) Weight:  [200 lb 13.4 oz (91.1 kg)] 200 lb 13.4 oz (91.1 kg) (04/24 2141)  Neurologically intact ABD soft Neurovascular intact Sensation intact distally Intact pulses distally Dorsiflexion/Plantar flexion intact   LABS  Recent Labs  10/30/16 1322 10/31/16 0924  HGB 8.5* 8.9*  WBC  --  16.1*  PLT  --  147*    Recent Labs  10/30/16 1322 10/31/16 0924  NA 143 141  K 3.2* 3.2*  CL  --  107  CO2  --  26  BUN  --  16  CREATININE  --  1.34*  GLUCOSE 123* 145*   No results for input(s): LABPT, INR in the last 72 hours.   Assessment/Plan: 1 Day Post-Op Procedure(s) (LRB): Removal of hardware rods and pedicle screws lumbar four, Extension of fusion L4-5 to L5-S1 with reinsertion of screws at L 4, left L5-S1 transforaminal lumbar interbody fusion, Exploration  left L5 nerve root, bilateral decompressive laminectomy L3-4 (N/A) HARDWARE REMOVAL  Perioperative acute blood loss.  Advance diet Up with therapy  Recheck H/H this evening due to history of multiple small blood vessel infarts  chronic and hypertension, currently low HTN.  Jessy Oto 10/31/2016, 4:53 PM Patient ID: [name], female   DOB: [birth year], 71 y.o.   MRN: [id number]

## 2016-10-31 NOTE — Progress Notes (Addendum)
Subjective: Patient very drowsy.  Only arouses for a few seconds.  Has not had pain medications this morning.    Objective: Vital signs in last 24 hours: Temp:  [97 F (36.1 C)-101.4 F (38.6 C)] 99 F (37.2 C) (04/25 0906) Pulse Rate:  [57-72] 67 (04/25 0906) Resp:  [3-20] 16 (04/25 0906) BP: (108-188)/(43-75) 115/43 (04/25 0906) SpO2:  [92 %-100 %] 92 % (04/25 0906) Weight:  [200 lb 13.4 oz (91.1 kg)] 200 lb 13.4 oz (91.1 kg) (04/24 2141)  Intake/Output from previous day: 04/24 0701 - 04/25 0700 In: 5185 [I.V.:3950; Blood:635; IV Piggyback:600] Out: 1800 [Urine:900; Blood:900] Intake/Output this shift: Total I/O In: 120 [P.O.:120] Out: -    Recent Labs  10/30/16 1322  HGB 8.5*    Recent Labs  10/30/16 1322  HCT 25.0*    Recent Labs  10/30/16 1322  NA 143  K 3.2*  GLUCOSE 123*   No results for input(s): LABPT, INR in the last 72 hours.  Exam:  Patient obtunded.  Does not respond to questions or follow my commands to move feet or legs. Gives minimal effort to move toes.   Will only stay awake for a few seconds.  RN helped roll patient to check wound.  Incision looks good.  Steri strips intact.  No drainage.  Not much swelling.  bilat calves nontender.   Assessment/Plan: s/p extension of lumbar fusion with decompression.  Obtunded this morning.  Spoke with hospitalist Dr Marily Memos for consult. Appreciate medicine help.  D/c morphine and oxycodone.  Ordered norco prn pain for now as we want patient more alert.  Will continue to monitor Hemoglobin (preop 24 October 2016 was 12.5).  May need transfusion PRBC's.      Benjiman Core 10/31/2016, 9:33 AM

## 2016-10-31 NOTE — Progress Notes (Signed)
Orthopedic Tech Progress Note Patient Details:  [name] 1945/08/28 [id number]  Patient ID: [name], female   DOB: May 01, 1946, 71 y.o.   MRN: [id number]   Hildred Priest 10/31/2016, 9:00 AM Called in bio-tech brace order; spoke with [name]

## 2016-10-31 NOTE — Care Management Note (Signed)
Case Management Note  Patient Details  Name: [name] MRN: [id number] Date of Birth: [birth year]  Subjective/Objective:  Pt underwent: Removal of hardware rods and pedicle screws lumbar four, Extension of fusion L4-5 to L5-S1 with reinsertion of screws at L 4, left L5-S1 transforaminal lumbar interbody fusion, Exploration  left L5 nerve root, bilateral decompressive laminectomy L3-4. She is from home with her spouse.                  Action/Plan: Awaiting PT/OT recommendations. CM following for d/c need, physician orders.   Expected Discharge Date:                  Expected Discharge Plan:     In-House Referral:     Discharge planning Services     Post Acute Care Choice:    Choice offered to:     DME Arranged:  3-N-1, Walker rolling DME Agency:     HH Arranged:    HH Agency:     Status of Service:  In process, will continue to follow  If discussed at Long Length of Stay Meetings, dates discussed:    Additional Comments:  Pollie Friar, RN 10/31/2016, 10:38 AM

## 2016-10-31 NOTE — Progress Notes (Signed)
PT Cancellation Note  Patient Details Name: [name] MRN: [id number] DOB: Oct 26, 1945   Cancelled Treatment:    Reason Eval/Treat Not Completed: Fatigue/lethargy limiting ability to participate. Pt very lethargic at this time and unable to maintain eyes open for longer than a few seconds. Pt's RN present in room attempted to assist pt with eating her lunch. PT will continue to f/u with pt as available and appropriate.   [place] 10/31/2016, 11:55 AM

## 2016-10-31 NOTE — Evaluation (Signed)
Occupational Therapy Evaluation Patient Details Name: [name] MRN: [id number] DOB: [birth year] Today's Date: 10/31/2016    History of Present Illness Pt is a 71 y/o female s/p Removal of hardware rods and pedicle screws lumbar four, Extension of fusion L4-5 to L5-S1 with reinsertion of screws at L 4, left L5-S1 transforaminal lumbar interbody fusion, Exploration left L5 nerve root, bilateral decompressive laminectomy L3-4. Pt has a past medical history including Anemia, Arthritis; CAD; Carotid artery occlusion; Cataract; Depressive disorder; Edema; Fibromyalgia; Glaucoma; Hearing loss; HSV (herpes simplex virus) anogenital infection (05/2014); HTN; Low back pain; Obesity; Peripheral vascular disease; Stroke ; TIA; Type II diabetes mellitus; Glaucoma surgery; Carotid endarterectomy (march 2012); Carpal tunnel release (Bilateral); Ulnar nerve transposition (Left, 05/14/2014); Anterior cervical corpectomy (N/A, 06/11/2014); Cardiac catheterization (12/29/2014); Eye surgery (Left, June 2016); Back surgery (2008); lumbar fusion (10/30/2016).   Clinical Impression   PTA Pt independent in ADL/IADL and mobility. Pt currently mod A for ADL and min guard for mobility with RW. Pt educated in don/doff of brace as well as back handout reviewed in full. Pt educated and practiced with AE. Pt requires continued skilled OT in the acute setting to maximize safety and independence in ADL prior to dc to venue below. Next session to focus on family/caregiver education and shower transfer.    Follow Up Recommendations  Home health OT;Supervision/Assistance - 24 hour    Equipment Recommendations  3 in 1 bedside commode;Other (comment) (AE kit)    Recommendations for Other Services PT consult     Precautions / Restrictions Precautions Precautions: Back Precaution Booklet Issued: Yes (comment) Precaution Comments: handout reviewed in full Required Braces or Orthoses: Spinal Brace Spinal Brace: Lumbar  corset;Applied in sitting position Restrictions Weight Bearing Restrictions: No      Mobility Bed Mobility               General bed mobility comments: Pt was on toilet and remained sitting EOB for bathing with NT. Pt educated in sidelying and rolling technique for bed mobility as shown in handout  Transfers Overall transfer level: Needs assistance Equipment used: Rolling walker (2 wheeled) Transfers: Sit to/from Stand Sit to Stand: Min assist         General transfer comment: vc for safe hand placement and maintenance of back precautions. Pt min A for boost and balance    Balance Overall balance assessment: Needs assistance Sitting-balance support: No upper extremity supported;Feet supported Sitting balance-Leahy Scale: Good Sitting balance - Comments: sitting EOB for practice with AE kit   Standing balance support: Bilateral upper extremity supported;During functional activity Standing balance-Leahy Scale: Poor Standing balance comment: reliant on RW for balance                           ADL either performed or assessed with clinical judgement   ADL Overall ADL's : Needs assistance/impaired Eating/Feeding: Set up;Sitting   Grooming: Wash/dry hands;Wash/dry face;Set up;Sitting Grooming Details (indicate cue type and reason): performed seated at EOB as brace had not arrived yet Upper Body Bathing: Moderate assistance;Sitting   Lower Body Bathing: Modified independent;With adaptive equipment;Cueing for back precautions;Sitting/lateral leans   Upper Body Dressing : Moderate assistance;Cueing for compensatory techniques;Sitting Upper Body Dressing Details (indicate cue type and reason): brace arrived during session, Pt educated on how to don/doff Lower Body Dressing: Moderate assistance;With adaptive equipment;Cueing for sequencing;Cueing for compensatory techniques;Cueing for back precautions Lower Body Dressing Details (indicate cue type and reason): AE  education/demo/practice Toilet Transfer:  Minimal assistance;Cueing for safety;Ambulation;Regular Toilet;RW;Grab bars   Toileting- Clothing Manipulation and Hygiene: Maximal assistance;Sit to/from stand Toileting - Clothing Manipulation Details (indicate cue type and reason): hospital gown, Pt dependent in peri care as she requires 2 hands on RW for balance Tub/ Shower Transfer: Walk-in shower;Minimal Scientist, research (physical sciences) Details (indicate cue type and reason): Pt states that she does not have anything built in to sit on, but would fit Functional mobility during ADLs: Minimal assistance;Cueing for sequencing;Rolling walker General ADL Comments: Pt educated on long handle sponge, grabber/reacher, long handle shoe horn, sock donner, toilet aide. Demo and practice. Needs reinforcement - suspect of medication     Vision Baseline Vision/History: Wears glasses Wears Glasses: Reading only Patient Visual Report: No change from baseline Vision Assessment?: No apparent visual deficits     Perception     Praxis      Pertinent Vitals/Pain Pain Assessment: 0-10 Pain Score: 6  Pain Location: back at incision Pain Descriptors / Indicators: Discomfort;Sore;Operative site guarding Pain Intervention(s): Limited activity within patient's tolerance;Monitored during session;Repositioned;Premedicated before session     Hand Dominance Right   Extremity/Trunk Assessment Upper Extremity Assessment Upper Extremity Assessment: Overall WFL for tasks assessed;Generalized weakness   Lower Extremity Assessment Lower Extremity Assessment: Defer to PT evaluation   Cervical / Trunk Assessment Cervical / Trunk Assessment: Other exceptions Cervical / Trunk Exceptions: recent surgery; rounded shoulders forward head   Communication Communication Communication: No difficulties   Cognition Arousal/Alertness: Suspect due to medications Behavior During Therapy: WFL for tasks  assessed/performed Overall Cognitive Status: Within Functional Limits for tasks assessed                                 General Comments: Pt with delayed response time, appropriate responses, decreased recall (suspect of medication)   General Comments  Pt is deaf in left ear    Exercises     Shoulder Instructions      Home Living Family/patient expects to be discharged to:: Private residence Living Arrangements: Spouse/significant other;Children (adult son) Available Help at Discharge: Family;Available PRN/intermittently Type of Home: House Home Access: Stairs to enter [employer] of Steps: 5 Entrance Stairs-Rails: Right;Left (typically goes up right side) Home Layout: One level     Bathroom Shower/Tub: Occupational psychologist: Standard Bathroom Accessibility: No   Home Equipment: Cane - single point;Hand held shower head          Prior Functioning/Environment Level of Independence: Independent        Comments: driving, completely independent        OT Problem List: Decreased range of motion;Decreased activity tolerance;Impaired balance (sitting and/or standing);Decreased safety awareness;Decreased knowledge of use of DME or AE;Decreased knowledge of precautions;Pain      OT Treatment/Interventions: Self-care/ADL training;Energy conservation;DME and/or AE instruction;Therapeutic activities;Patient/family education;Balance training    OT Goals(Current goals can be found in the care plan section) Acute Rehab OT Goals Patient Stated Goal: to walk normal OT Goal Formulation: With patient Time For Goal Achievement: 11/14/16 Potential to Achieve Goals: Good ADL Goals Pt Will Transfer to Toilet: with modified independence;ambulating (with RW, BSC over toilet) Pt Will Perform Toileting - Clothing Manipulation and hygiene: with modified independence;sit to/from stand Additional ADL Goal #1: Pt will recall 3/3 back precautions and give  2 examples of strategies to maintain during ADL with no verbal cues Additional ADL Goal #2: Pt will perform bed mobility maintaining precautions at supervision level  as precursor to ADL. Additional ADL Goal #3: Pt will demonstrate competence in AE kit with 3 or less verbal cues for ADL completion  OT Frequency: Min 2X/week   Barriers to D/C:            Co-evaluation              End of Session Equipment Utilized During Treatment: Rolling walker;Back brace Nurse Communication: Mobility status;Precautions  Activity Tolerance: Patient tolerated treatment well;Other (comment) (worried about edcuation retention with lethargy) Patient left: in bed;with call bell/phone within reach;with nursing/sitter in room  OT Visit Diagnosis: Unsteadiness on feet (R26.81);Pain Pain - Right/Left: Right Pain - part of body: Leg (back)                Time: 1006-1030 OT Time Calculation (min): 24 min Charges:  OT General Charges $OT Visit: 1 Procedure OT Evaluation $OT Eval Moderate Complexity: 1 Procedure OT Treatments $Self Care/Home Management : 8-22 mins G-Codes:     Hulda Humphrey OTR/L [place] 10/31/2016, 11:01 AM

## 2016-10-31 NOTE — Anesthesia Postprocedure Evaluation (Signed)
Anesthesia Post Note  Patient: MAELIE CHRISWELL  Procedure(s) Performed: Procedure(s) (LRB): Removal of hardware rods and pedicle screws lumbar four, Extension of fusion L4-5 to L5-S1 with reinsertion of screws at L 4, left L5-S1 transforaminal lumbar interbody fusion, Exploration  left L5 nerve root, bilateral decompressive laminectomy L3-4 (N/A) HARDWARE REMOVAL  Patient location during evaluation: PACU Anesthesia Type: General Level of consciousness: awake and alert Pain management: pain level controlled Vital Signs Assessment: post-procedure vital signs reviewed and stable Respiratory status: spontaneous breathing, nonlabored ventilation and respiratory function stable Cardiovascular status: blood pressure returned to baseline and stable Postop Assessment: no signs of nausea or vomiting Anesthetic complications: no       Last Vitals:  Vitals:   10/31/16 0906 10/31/16 1318  BP: (!) 115/43 (!) 110/33  Pulse: 67 65  Resp: 16 16  Temp: 37.2 C 36.4 C    Last Pain:  Vitals:   10/31/16 1350  TempSrc:   PainSc: 5                  Sheena Donegan,W. EDMOND

## 2016-10-31 NOTE — Progress Notes (Signed)
Encouraged IS use throughout shift. Patient educated and understands how to use IS. Demonstrated use multiple times for RN. Reaches 500.

## 2016-10-31 NOTE — Progress Notes (Signed)
    Acetone and chest x-ray findings reviewed. These all may simply be due to recent surgery with low respiratory effort. Patient encouraged to use her incentive spirometer and to ambulate. Patient seems to be asymptomatic from a respiratory standpoint vital signs remained reassuring. Chest x-ray will be repeated in a.m. If at any point time patient develops worsening respiratory status along with fevers I highly recommend starting antibiotics.   Linna Darner, MD [facility] 10/31/2016, 2:24 PM

## 2016-11-01 ENCOUNTER — Encounter (HOSPITAL_COMMUNITY): Payer: Self-pay | Admitting: Specialist

## 2016-11-01 ENCOUNTER — Inpatient Hospital Stay (HOSPITAL_COMMUNITY): Payer: Medicare Other

## 2016-11-01 LAB — COMPREHENSIVE METABOLIC PANEL
ALBUMIN: 2.9 g/dL — AB (ref 3.5–5.0)
ALT: 10 U/L — AB (ref 14–54)
AST: 31 U/L (ref 15–41)
Alkaline Phosphatase: 54 U/L (ref 38–126)
Anion gap: 9 (ref 5–15)
BUN: 20 mg/dL (ref 6–20)
CHLORIDE: 108 mmol/L (ref 101–111)
CO2: 21 mmol/L — AB (ref 22–32)
CREATININE: 1.26 mg/dL — AB (ref 0.44–1.00)
Calcium: 7.8 mg/dL — ABNORMAL LOW (ref 8.9–10.3)
GFR calc Af Amer: 50 mL/min — ABNORMAL LOW (ref >60)
GFR calc non Af Amer: 43 mL/min — ABNORMAL LOW (ref >60)
GLUCOSE: 162 mg/dL — AB (ref 65–99)
POTASSIUM: 3.5 mmol/L (ref 3.5–5.1)
Sodium: 138 mmol/L (ref 135–145)
Total Bilirubin: 0.7 mg/dL (ref 0.3–1.2)
Total Protein: 5 g/dL — ABNORMAL LOW (ref 6.5–8.1)

## 2016-11-01 LAB — GLUCOSE, CAPILLARY
GLUCOSE-CAPILLARY: 208 mg/dL — AB (ref 65–99)
Glucose-Capillary: 164 mg/dL — ABNORMAL HIGH (ref 65–99)
Glucose-Capillary: 146 mg/dL — ABNORMAL HIGH (ref 65–99)
Glucose-Capillary: 119 mg/dL — ABNORMAL HIGH (ref 65–99)

## 2016-11-01 LAB — CBC
HEMATOCRIT: 25.3 % — AB (ref 36.0–46.0)
Hemoglobin: 8.4 g/dL — ABNORMAL LOW (ref 12.0–15.0)
MCH: 27.9 pg (ref 26.0–34.0)
MCHC: 33.2 g/dL (ref 30.0–36.0)
MCV: 84.1 fL (ref 78.0–100.0)
PLATELETS: 111 10*3/uL — AB (ref 150–400)
RBC: 3.01 MIL/uL — AB (ref 3.87–5.11)
RDW: 16.2 % — AB (ref 11.5–15.5)
WBC: 14 10*3/uL — AB (ref 4.0–10.5)

## 2016-11-01 MED ORDER — LEVOFLOXACIN 500 MG PO TABS
500.0000 mg | ORAL_TABLET | ORAL | Status: DC
Start: 2016-11-01 — End: 2016-11-04
  Administered 2016-11-01 – 2016-11-03 (×3): 500 mg via ORAL
  Filled 2016-11-01 (×3): qty 1

## 2016-11-01 NOTE — Evaluation (Signed)
Physical Therapy Evaluation Patient Details Name: [name] MRN: [id number] DOB: [birth year] Today's Date: 11/01/2016   History of Present Illness  Pt is a 71 y/o female s/p extension of fusion at L4-S1, L L5-S1 TLIF and bilateral decompressive laminectomy at L3-L4. PMH including but not limited to DM, CAD, HTN, PVD, hx of CVA/TIA and ACC in 2015.  Clinical Impression  Pt presented supine in bed with HOB elevated, awake and willing to participate in therapy session. Pt still very lethargic initially but able to participate in evaluation today. Prior to admission, pt was independent with all functional mobility. Pt will have her husband and son available 24/7 to assist her upon d/c. Pt ambulated within her room with use of RW and min guard. PT reviewed 3/3 back precautions with pt throughout session. Pt would continue to benefit from skilled physical therapy services at this time while admitted and after d/c to address the below listed limitations in order to improve overall safety and independence with functional mobility.     Follow Up Recommendations Home health PT;Supervision/Assistance - 24 hour    Equipment Recommendations  Rolling walker with 5" wheels    Recommendations for Other Services       Precautions / Restrictions Precautions Precautions: Back Precaution Booklet Issued: No Precaution Comments: PT reviewed 3/3 back precautions with pt  Required Braces or Orthoses: Spinal Brace Spinal Brace: Lumbar corset;Applied in sitting position Restrictions Weight Bearing Restrictions: No      Mobility  Bed Mobility Overal bed mobility: Needs Assistance Bed Mobility: Rolling;Sidelying to Sit Rolling: Supervision Sidelying to sit: Min guard       General bed mobility comments: increased time, use of bed rails, VC'ing for technique, min guard for safety to elevate trunk  Transfers Overall transfer level: Needs assistance Equipment used: Rolling walker (2  wheeled) Transfers: Sit to/from Stand Sit to Stand: Min guard         General transfer comment: increased time, VC'ing for bilateral UE placement and min guard for safety  Ambulation/Gait Ambulation/Gait assistance: Min guard Ambulation Distance (Feet): 20 Feet Assistive device: Rolling walker (2 wheeled) Gait Pattern/deviations: Step-to pattern;Step-through pattern;Decreased step length - right;Decreased step length - left;Decreased stride length;Shuffle Gait velocity: decreased Gait velocity interpretation: Below normal speed for age/gender General Gait Details: very slow cadence, shuffled gait, pt required VC'ing for improved postural alignment  Stairs            Wheelchair Mobility    Modified Rankin (Stroke Patients Only)       Balance Overall balance assessment: Needs assistance Sitting-balance support: No upper extremity supported;Feet supported Sitting balance-Leahy Scale: Fair Sitting balance - Comments: pt able to sit EOB with supervision   Standing balance support: Bilateral upper extremity supported;During functional activity Standing balance-Leahy Scale: Poor Standing balance comment: reliant on RW for balance                             Pertinent Vitals/Pain Pain Assessment: No/denies pain Pain Intervention(s): Monitored during session    Home Living Family/patient expects to be discharged to:: Private residence Living Arrangements: Spouse/significant other;Children (adult son) Available Help at Discharge: Family;Available PRN/intermittently Type of Home: House Home Access: Stairs to enter Entrance Stairs-Rails: Right;Left (typically goes up right side) Entrance Stairs-Number of Steps: 5 Home Layout: One level Home Equipment: Cane - single point;Hand held shower head      Prior Function Level of Independence: Independent  Hand Dominance   Dominant Hand: Right    Extremity/Trunk Assessment   Upper  Extremity Assessment Upper Extremity Assessment: Defer to OT evaluation    Lower Extremity Assessment Lower Extremity Assessment: Generalized weakness    Cervical / Trunk Assessment Cervical / Trunk Assessment: Other exceptions Cervical / Trunk Exceptions: s/p lumbar sx  Communication   Communication: No difficulties  Cognition Arousal/Alertness: Lethargic;Suspect due to medications Behavior During Therapy: [facility] for tasks assessed/performed Overall Cognitive Status: Within Functional Limits for tasks assessed                                        General Comments      Exercises     Assessment/Plan    PT Assessment Patient needs continued PT services  PT Problem List Decreased balance;Decreased mobility;Decreased coordination;Decreased knowledge of use of DME;Decreased safety awareness;Decreased knowledge of precautions;Pain       PT Treatment Interventions Gait training;DME instruction;Stair training;Functional mobility training;Therapeutic activities;Therapeutic exercise;Balance training;Neuromuscular re-education;Patient/family education    PT Goals (Current goals can be found in the Care Plan section)  Acute Rehab PT Goals Patient Stated Goal: return home PT Goal Formulation: With patient Time For Goal Achievement: 11/15/16 Potential to Achieve Goals: Fair    Frequency Min 5X/week   Barriers to discharge        Co-evaluation               End of Session Equipment Utilized During Treatment: Gait belt;Back brace Activity Tolerance: Patient tolerated treatment well Patient left: with call bell/phone within reach;with bed alarm set;Other (comment) (sitting EOB) Nurse Communication: Mobility status PT Visit Diagnosis: Other abnormalities of gait and mobility (R26.89)    Time: 0737-1062 PT Time Calculation (min) (ACUTE ONLY): 12 min   Charges:   PT Evaluation $PT Eval Moderate Complexity: 1 Procedure     PT G Codes:         Sherie Don, PT, DPT [place] 11/01/2016, 12:48 PM

## 2016-11-01 NOTE — Consult Note (Signed)
CONSULT PROGRESS NOTE    [name]  PYK:[id number] DOB: 02/07/1946 DOA: 10/30/2016 PCP: Alysia Penna, MD    Brief Narrative: [name] is a 71 year old female with a medical history significant for peripheral vascular disease, history of stroke/TIA, type II diabetes mellitus, hypertension, hyperlipidemia, and CAD s/p cardiac catheretization with stent placement who is recovering from elective spinal surgery on 10/30/16. Surgery included removal of left L5 pedicle screw, extension of fusion L4-5 to L5-S1 with left L5-S1 transforaminal lumbar interbody fusion, explore left L5 nerve root, and bilateral decompressive laminectomy L3-4 by Dr. Louanne Skye  that required prolonged anesthesia. Patient was obtunded on 4/25 and medicine team was consulted.   Subjective: Patient states she has been well and denies chest pain, difficulty breathing, fever/chills. States pain is well controlled. She is able to sit up in the chair and watch TV.   Assessment & Plan:   Principal Problem:   Spondylolisthesis, lumbar region Active Problems:   Spinal stenosis of lumbar region   Retained orthopedic hardware   Spinal stenosis of lumbar region with neurogenic claudication   Lethargy   AKI (acute kidney injury) ([place])   Chronic diastolic CHF (congestive heart failure) (HCC)   Cough   Leukocytosis  Spondylolisthesis and spinal stenosis of lumbar region s/p spinal surgery POD #2: - On 4/24, patient underwent removal of left L5 pedicle screw, extension of fusion L4-5 to L5-S1 with left L5-S1 transforaminal lumbar interbody fusion, explore left L5 nerve root, and bilateral decompressive laminectomy L3-4 by Dr. Louanne Skye that required prolonged anesthesia  - Orthopedics is following patient, continue management   Lethargy - Likely due to combination of opioids and prolonged anesthesia during surgery. Patient is non-focal, A/O x 3 on physical exam and it is unlikely due to infection, seizure, or CVA. Today, she  was sitting in the chair and watching TV and lethargy seems to be improving dramatically from yesterday.  - Continue opioids as needed with close O2 monitoring for CNS depression. Give Robaxin sparingly.   Atelectasis vs pneumonia - CXR on 4/25 showed concern for early left lower lobe pneumonia with effusion and postoperative atelectasis was not excluded.  - Repeat CXR on 4/26 shows low volume chest. Atelectasis or infection at the left base is improved from yesterday. Given patient improvement overnight without antibiotics and she is POD#2, atelectasis is likely and there is low suspicion for pneumonia. - O2 sat 93% on RA. Continue to monitor for O2 desaturation.  - Patient had a fever of 101.4 on 4/25 and has remained afebrile since with improving leukocytosis. No need for antibiotics currently, but will continue to monitor closely for infection  Acute kidney injury post operatively - Cr elevated at 1.34 and trending down to 1.26 today likely due to hypoperfusion and prolonged anesthesia during surgery  - Discontinue Lasix, Aldactone, Celebrex, and Irbesartan until kidney functioning improves in order to avoid nephrotoxic drugs  Diastolic CHF EF 53% w/ Grade 2 diastolic dysfunction (per ECHO on 12/2014) - D/C Lasix, Aldactone, Irbesartan due to elevated creatinine 1.26 and d/c IV fluids to prevent fluid overload given the discontinuation of diuretics  - Monitor patient closely for fluid overload, trend Cr.   - Diastolic BP has been in the 30-60s since admission. Will discontinue methyldopa and continue hydralazine and metoprolol for blood pressure control  Anemia/Leukocytosis/Thrombocytopenia - Hgb 8.9-8.4, likely due to intraoperative blood loss. Continue to monitor and consider transfusion if Hgb < 8.  - Leukocytosis improving 16.5 >> 14 today likely in  the setting of postop recovery and atelectasis. Repeat CXR on 4/26 did not indicate pneumonia and leukocytosis is less likely from an  infectious source given patient is afebrile  - Platelets trending down 147 >> 126 >> 111 since admission likely in the setting of intraoperative blood loss. - Continue to monitor CBC  Diabetes mellitus type II - Patient placed on SSI upon admission but refused because she has an insulin pump from home. Will d/c SSI and allow patient to continue home insulin pump  CAD with a stent placement, History of CVA/TIA - Continue aspirin and Crestor, nitro prn  DVT prophylaxis: Per orthopedic management Code Status: Full Family Communication: None at bedside Disposition Plan: Home  Consultants:   Orthopedic surgery following patient  Procedures:   Spinal surgery by Dr. Louanne Skye 4/24  Antimicrobials:  None   Objective: Vitals:   10/31/16 2113 11/01/16 0100 11/01/16 0440 11/01/16 0510  BP: (!) 151/48 (!) 151/49  (!) 143/39  Pulse: 72 76  80  Resp: 16 20  20   Temp: 98.4 F (36.9 C) 99.5 F (37.5 C)  99.6 F (37.6 C)  TempSrc: Oral Oral  Oral  SpO2: 92% 92%  91%  Weight:   97.8 kg (215 lb 8 oz)   Height:        Intake/Output Summary (Last 24 hours) at 11/01/16 1013 Last data filed at 11/01/16 0802  Gross per 24 hour  Intake              500 ml  Output              850 ml  Net             -350 ml   Filed Weights   10/30/16 2141 11/01/16 0440  Weight: 91.1 kg (200 lb 13.4 oz) 97.8 kg (215 lb 8 oz)    Examination:  General exam: Appears calm and comfortable. She communicates effectively and is alert and is sitting upright in the chair watching TV  Respiratory system: Left lower lobe crackles. Respiratory effort normal. Cardiovascular system: S1 & S2 heard, RRR. No JVD, murmurs, rubs, gallops or clicks. No pedal edema. Gastrointestinal system: Abdomen is nondistended, soft and nontender. No organomegaly or masses felt. Normal bowel sounds heard. Central nervous system: Alert and oriented. No focal neurological deficits. Extremities: Symmetric 5 x 5 power. Skin: No rashes,  lesions or ulcers Psychiatry: Judgement and insight appear normal. Mood & affect appropriate.   Data Reviewed: I have personally reviewed following labs and imaging studies  CBC:  Recent Labs Lab 10/30/16 1322 10/31/16 0924 10/31/16 1841 11/01/16 0759  WBC  --  16.1* 16.5* 14.0*  NEUTROABS  --   --  12.6*  --   HGB 8.5* 8.9* 8.7* 8.4*  HCT 25.0* 27.6* 26.8* 25.3*  MCV  --  84.4 85.1 84.1  PLT  --  147* 126* PENDING   Basic Metabolic Panel:  Recent Labs Lab 10/30/16 1322 10/31/16 0924 11/01/16 0759  NA 143 141 138  K 3.2* 3.2* 3.5  CL  --  107 108  CO2  --  26 21*  GLUCOSE 123* 145* 162*  BUN  --  16 20  CREATININE  --  1.34* 1.26*  CALCIUM  --  7.9* 7.8*   GFR: Estimated Creatinine Clearance: 48.5 mL/min (A) (by C-G formula based on SCr of 1.26 mg/dL (H)). Liver Function Tests:  Recent Labs Lab 11/01/16 0759  AST 31  ALT 10*  ALKPHOS 54  BILITOT 0.7  PROT 5.0*  ALBUMIN 2.9*   No results for input(s): LIPASE, AMYLASE in the last 168 hours. No results for input(s): AMMONIA in the last 168 hours. Coagulation Profile: No results for input(s): INR, PROTIME in the last 168 hours. Cardiac Enzymes: No results for input(s): CKTOTAL, CKMB, CKMBINDEX, TROPONINI in the last 168 hours. BNP (last 3 results) No results for input(s): PROBNP in the last 8760 hours. HbA1C:  Recent Labs  10/30/16 1641  HGBA1C 7.9*   CBG:  Recent Labs Lab 10/31/16 0935 10/31/16 1109 10/31/16 1619 10/31/16 2152 11/01/16 0614  GLUCAP 140* 133* 155* 142* 164*   Lipid Profile: No results for input(s): CHOL, HDL, LDLCALC, TRIG, CHOLHDL, LDLDIRECT in the last 72 hours. Thyroid Function Tests: No results for input(s): TSH, T4TOTAL, FREET4, T3FREE, THYROIDAB in the last 72 hours. Anemia Panel: No results for input(s): VITAMINB12, FOLATE, FERRITIN, TIBC, IRON, RETICCTPCT in the last 72 hours. Sepsis Labs:  Recent Labs Lab 10/31/16 1232  PROCALCITON 2.36    Recent Results  (from the past 240 hour(s))  Surgical pcr screen     Status: None   Collection Time: 10/24/16  9:10 AM  Result Value Ref Range Status   MRSA, PCR NEGATIVE NEGATIVE Final   Staphylococcus aureus NEGATIVE NEGATIVE Final    Comment:        The Xpert SA Assay (FDA approved for NASAL specimens in patients over 49 years of age), is one component of a comprehensive surveillance program.  Test performance has been validated by [facility] for patients greater than or equal to 84 year old. It is not intended to diagnose infection nor to guide or monitor treatment.          Radiology Studies: Dg Chest 2 View  Result Date: 11/01/2016 CLINICAL DATA:  Pneumonia and weakness EXAM: CHEST  2 VIEW COMPARISON:  Yesterday FINDINGS: Limited low volume chest. More symmetric aeration today, but still streaky density seen at the left base. No edema, effusion, or pneumothorax. Mild cardiomegaly. IMPRESSION: Low volume chest. Atelectasis or infection at the left base is improved from yesterday. Electronically Signed   By: Monte Fantasia M.D.   On: 11/01/2016 08:00   Dg Lumbar Spine Complete  Result Date: 10/30/2016 CLINICAL DATA:  Lumbar spine surgery. EXAM: LUMBAR SPINE - COMPLETE 4+ VIEW; DG C-ARM 61-120 MIN COMPARISON:  08/21/2016 04/12/2016. FINDINGS: Lumbar vertebra numbered with the lowest segmented appearing lumbar shaped vertebral bodies L5. Postsurgical changes lumbar spine with L4 through S1 posterior and interbody fusion. Hardware intact. Anatomic alignment. Four images obtained. 1 minutes 50 seconds fluoroscopy time utilized . IMPRESSION: Postsurgical changes lumbosacral spine. Electronically Signed   By: Marcello Moores  Register   On: 10/30/2016 14:35   Dg Chest Port 1 View  Result Date: 10/31/2016 CLINICAL DATA:  Cough.  Recent lumbar fusion 10/30/2016. EXAM: PORTABLE CHEST 1 VIEW COMPARISON:  10/24/2016. FINDINGS: Low lung volumes. New retrocardiac opacity could represent developing infiltrate.  Small LEFT effusion is also noted. RIGHT lung clear. Cardiomediastinal silhouette is increased due to low lung volumes. Prior cervical fusion. Cholecystectomy. IMPRESSION: Concern for early LEFT lower lobe pneumonia with effusion. Postoperative atelectasis not excluded. Electronically Signed   By: Staci Righter M.D.   On: 10/31/2016 12:10   Dg C-arm 61-120 Min  Result Date: 10/30/2016 CLINICAL DATA:  Lumbar spine surgery. EXAM: LUMBAR SPINE - COMPLETE 4+ VIEW; DG C-ARM 61-120 MIN COMPARISON:  08/21/2016 04/12/2016. FINDINGS: Lumbar vertebra numbered with the lowest segmented appearing lumbar shaped vertebral bodies L5. Postsurgical changes lumbar spine  with L4 through S1 posterior and interbody fusion. Hardware intact. Anatomic alignment. Four images obtained. 1 minutes 50 seconds fluoroscopy time utilized . IMPRESSION: Postsurgical changes lumbosacral spine. Electronically Signed   By: Marcello Moores  Register   On: 10/30/2016 14:35   Scheduled Meds: . aspirin EC  81 mg Oral Daily  . celecoxib  200 mg Oral Q12H  . docusate sodium  100 mg Oral BID  . furosemide  40 mg Oral Daily  . hydrALAZINE  100 mg Oral TID  . insulin aspart  0-15 Units Subcutaneous TID WC  . insulin aspart  4 Units Subcutaneous TID WC  . insulin glargine  5 Units Subcutaneous QHS  . irbesartan  300 mg Oral Daily  . isosorbide mononitrate  60 mg Oral Daily  . latanoprost  1 drop Both Eyes QHS  . methyldopa  250 mg Oral BID  . metoprolol succinate  100 mg Oral Daily  . montelukast  10 mg Oral Daily  . pantoprazole  40 mg Oral Daily  . rosuvastatin  40 mg Oral Daily  . sodium chloride flush  3 mL Intravenous Q12H  . spironolactone  25 mg Oral Daily  . [START ON 11/03/2016] Vitamin D (Ergocalciferol)  50,000 Units Oral Q Sat   Continuous Infusions: . sodium chloride    . sodium chloride Stopped (11/01/16 0918)  . methocarbamol (ROBAXIN)  IV       LOS: 2 days    Time spent: 30 minutes  Deanglo Hissong, PA-Student   If  7PM-7AM, please contact night-coverage [email or web address] Password [facility] 11/01/2016, 10:13 AM

## 2016-11-01 NOTE — Progress Notes (Signed)
Going for CXR.

## 2016-11-01 NOTE — Consult Note (Signed)
[facility] CM Primary Care Navigator  11/01/2016  [name] 14-May-1946 [id number]   Met withpatient at the bedside to identify possible discharge needs. Patient reports having constant, persistent pain to back radiating mostly to right leg that had led to this admission/ surgery. Patient endorsesDr. Alysia Penna  with  [facility] [place] as the primary care provider.   Patient shared using [facility] to obtain medications without any problem.    Patient states managing her own medications at home using "pill box" system weekly.  Patient mentioned being able to drive prior to admission/surgery but husband [name]) will provide transportation to her doctors' appointments after discharge.    Her husband will be the primary caregiver at home as stated  Anticipated plan for discharge is home per patient, with home health services per therapy recommendation.  Patient voiced understanding to call primary care provider's office once she returns home, for a post discharge follow-up appointment within a week or sooner if needed.Patient letter (with PCP's contact number) was provided as a reminder.  Discussed with patient regarding [facility] CM services available for healthmanagement and patient reports that HF "has not been a bother" and DM is being managed at home. She is currently on V-Go insulin pump with recent A1c of 7.9. Patient is aware of ways to manage DM from classes she attended few years ago. She reports that endocrinologist (Dr. Baldwin Crown) monitors and assists with DM management. [facility] care management contact information provided for future needs that may arise.    For questions, please contact:  Dannielle Huh, BSN, RN- [facility] Primary Care Navigator  Telephone: [phone] [place]

## 2016-11-01 NOTE — Progress Notes (Signed)
Subjective: Doing well this morning.  Pain controlled.  Up in chair when I went in room.  Asks me "who are you?".  states that she didn't remember me coming in to see her yesterday.     Objective: Vital signs in last 24 hours: Temp:  [97.6 F (36.4 C)-99.6 F (37.6 C)] 99.6 F (37.6 C) (04/26 0510) Pulse Rate:  [65-80] 80 (04/26 0510) Resp:  [16-20] 20 (04/26 0510) BP: (110-151)/(33-49) 143/39 (04/26 0510) SpO2:  [90 %-95 %] 91 % (04/26 0510) Weight:  [215 lb 8 oz (97.8 kg)] 215 lb 8 oz (97.8 kg) (04/26 0440)  Intake/Output from previous day: 04/25 0701 - 04/26 0700 In: 620 [P.O.:620] Out: 750 [Urine:750] Intake/Output this shift: Total I/O In: -  Out: 100 [Urine:100]   Recent Labs  10/30/16 1322 10/31/16 0924 10/31/16 1841 11/01/16 0759  HGB 8.5* 8.9* 8.7* 8.4*    Recent Labs  10/31/16 1841 11/01/16 0759  WBC 16.5* 14.0*  RBC 3.15* 3.01*  HCT 26.8* 25.3*  PLT 126* PENDING    Recent Labs  10/30/16 1322 10/31/16 0924  NA 143 141  K 3.2* 3.2*  CL  --  107  CO2  --  26  BUN  --  16  CREATININE  --  1.34*  GLUCOSE 123* 145*  CALCIUM  --  7.9*   No results for input(s): LABPT, INR in the last 72 hours.  Exam: Alert and oriented.  bilat calves nontender.  NVI.    Assessment/Plan: PT today.  Greatly appreciate medicine team help.     Benjiman Core 11/01/2016, 9:05 AM

## 2016-11-01 NOTE — Progress Notes (Signed)
Pharmacy Antibiotic Note  [name] is a 71 y.o. female admitted on 10/30/2016 with pneumonia.  Pharmacy has been consulted for Levofloxacin po dosing x 5 days.  CXR with atelectasis vs LLL pna.  PCT > 2  Plan: Levofloxacin 500mg  po q24 x 5 doses  Height: 5\' 4"  (162.6 cm) Weight: 215 lb 8 oz (97.8 kg) IBW/kg (Calculated) : 54.7  Temp (24hrs), Avg:98.9 F (37.2 C), Min:98.2 F (36.8 C), Max:99.6 F (37.6 C)   Recent Labs Lab 10/31/16 0924 10/31/16 1841 11/01/16 0759  WBC 16.1* 16.5* 14.0*  CREATININE 1.34*  --  1.26*    Estimated Creatinine Clearance: 48.5 mL/min (A) (by C-G formula based on SCr of 1.26 mg/dL (H)).    Allergies  Allergen Reactions  . No Known Allergies      Thank you for allowing pharmacy to be a part of this patient's care.  Lewie Chamber., PharmD Clinical Pharmacist [facility]

## 2016-11-02 DIAGNOSIS — D500 Iron deficiency anemia secondary to blood loss (chronic): Secondary | ICD-10-CM | POA: Diagnosis not present

## 2016-11-02 DIAGNOSIS — D649 Anemia, unspecified: Secondary | ICD-10-CM

## 2016-11-02 DIAGNOSIS — J181 Lobar pneumonia, unspecified organism: Secondary | ICD-10-CM

## 2016-11-02 LAB — BASIC METABOLIC PANEL
Anion gap: 7 (ref 5–15)
BUN: 23 mg/dL — AB (ref 6–20)
CHLORIDE: 108 mmol/L (ref 101–111)
CO2: 24 mmol/L (ref 22–32)
CREATININE: 1.19 mg/dL — AB (ref 0.44–1.00)
Calcium: 8.1 mg/dL — ABNORMAL LOW (ref 8.9–10.3)
GFR calc Af Amer: 53 mL/min — ABNORMAL LOW (ref >60)
GFR calc non Af Amer: 46 mL/min — ABNORMAL LOW (ref >60)
Glucose, Bld: 125 mg/dL — ABNORMAL HIGH (ref 65–99)
Potassium: 3.4 mmol/L — ABNORMAL LOW (ref 3.5–5.1)
SODIUM: 139 mmol/L (ref 135–145)

## 2016-11-02 LAB — CBC
HEMATOCRIT: 24.1 % — AB (ref 36.0–46.0)
HEMOGLOBIN: 7.6 g/dL — AB (ref 12.0–15.0)
MCH: 26.8 pg (ref 26.0–34.0)
MCHC: 31.5 g/dL (ref 30.0–36.0)
MCV: 84.9 fL (ref 78.0–100.0)
Platelets: 102 10*3/uL — ABNORMAL LOW (ref 150–400)
RBC: 2.84 MIL/uL — ABNORMAL LOW (ref 3.87–5.11)
RDW: 15.7 % — ABNORMAL HIGH (ref 11.5–15.5)
WBC: 11 10*3/uL — ABNORMAL HIGH (ref 4.0–10.5)

## 2016-11-02 LAB — GLUCOSE, CAPILLARY
GLUCOSE-CAPILLARY: 143 mg/dL — AB (ref 65–99)
Glucose-Capillary: 125 mg/dL — ABNORMAL HIGH (ref 65–99)
Glucose-Capillary: 164 mg/dL — ABNORMAL HIGH (ref 65–99)
Glucose-Capillary: 171 mg/dL — ABNORMAL HIGH (ref 65–99)
Glucose-Capillary: 198 mg/dL — ABNORMAL HIGH (ref 65–99)

## 2016-11-02 LAB — IRON AND TIBC
IRON: 9 ug/dL — AB (ref 28–170)
Saturation Ratios: 4 % — ABNORMAL LOW (ref 10.4–31.8)
TIBC: 228 ug/dL — AB (ref 250–450)
UIBC: 219 ug/dL

## 2016-11-02 LAB — PREPARE RBC (CROSSMATCH)

## 2016-11-02 LAB — PROCALCITONIN: PROCALCITONIN: 1 ng/mL

## 2016-11-02 LAB — FERRITIN: Ferritin: 80 ng/mL (ref 11–307)

## 2016-11-02 MED ORDER — MAGNESIUM HYDROXIDE 400 MG/5ML PO SUSP: 30.0000 mL | Freq: Every day | ORAL | Status: DC | PRN

## 2016-11-02 MED ORDER — FUROSEMIDE 10 MG/ML IJ SOLN
20.0000 mg | Freq: Once | INTRAMUSCULAR | Status: AC
  Administered 2016-11-02: 20 mg via INTRAVENOUS
  Filled 2016-11-02: qty 4

## 2016-11-02 MED ORDER — FERROUS GLUCONATE 324 (38 FE) MG PO TABS
324.0000 mg | ORAL_TABLET | Freq: Three times a day (TID) | ORAL | Status: DC
  Administered 2016-11-02 – 2016-11-04 (×7): 324 mg via ORAL
  Filled 2016-11-02 (×8): qty 1

## 2016-11-02 MED ORDER — POTASSIUM CHLORIDE CRYS ER 20 MEQ PO TBCR
40.0000 meq | EXTENDED_RELEASE_TABLET | Freq: Every day | ORAL | Status: DC
  Administered 2016-11-02 – 2016-11-04 (×3): 40 meq via ORAL
  Filled 2016-11-02 (×3): qty 2

## 2016-11-02 MED ORDER — ACETAMINOPHEN 325 MG PO TABS
650.0000 mg | ORAL_TABLET | Freq: Once | ORAL | Status: AC
  Administered 2016-11-02: 650 mg via ORAL
  Filled 2016-11-02: qty 2

## 2016-11-02 MED ORDER — SODIUM CHLORIDE 0.9 % IV SOLN
Freq: Once | INTRAVENOUS | Status: AC
  Administered 2016-11-02: via INTRAVENOUS
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
## 2016-11-02 NOTE — Care Management Important Message (Signed)
Important Message  Patient Details  Name: [name] MRN: [id number] Date of Birth: 1946/01/24   Medicare Important Message Given:  Yes    Nathen May 11/02/2016, 2:29 PM

## 2016-11-02 NOTE — Progress Notes (Addendum)
     Subjective: 3 Days Post-Op Procedure(s) (LRB): Removal of hardware rods and pedicle screws lumbar four, Extension of fusion L4-5 to L5-S1 with reinsertion of screws at L 4, left L5-S1 transforaminal lumbar interbody fusion, Exploration  left L5 nerve root, bilateral decompressive laminectomy L3-4 (N/A) HARDWARE REMOVALDifficulty working with PT this afternoon. Hgb 7.6, history of multiple small blood vessel infarct in past, Lethargy and difficulty working with PT. Diastolic BP 45.  Patient reports pain as moderate.    Objective:   VITALS:  Temp:  [98.5 F (36.9 C)-99.7 F (37.6 C)] 99.6 F (37.6 C) (04/27 1323) Pulse Rate:  [61-68] 62 (04/27 1323) Resp:  [16-18] 18 (04/27 1323) BP: (111-158)/(34-61) 149/45 (04/27 1323) SpO2:  [95 %-100 %] 97 % (04/27 1323) Weight:  [226 lb 1.6 oz (102.6 kg)-227 lb 9.6 oz (103.2 kg)] 226 lb 1.6 oz (102.6 kg) (04/27 0700)  Neurologically intact ABD soft Neurovascular intact Sensation intact distally Intact pulses distally Dorsiflexion/Plantar flexion intact Incision: dressing C/D/I   LABS  Recent Labs  10/31/16 0924 10/31/16 1841 11/01/16 0759 11/02/16 0437  HGB 8.9* 8.7* 8.4* 7.6*  WBC 16.1* 16.5* 14.0* 11.0*  PLT 147* 126* 111* 102*    Recent Labs  11/01/16 0759 11/02/16 0437  NA 138 139  K 3.5 3.4*  CL 108 108  CO2 21* 24  BUN 20 23*  CREATININE 1.26* 1.19*  GLUCOSE 162* 125*   No results for input(s): LABPT, INR in the last 72 hours.   Assessment/Plan: 3 Days Post-Op Procedure(s) (LRB): Removal of hardware rods and pedicle screws lumbar four, Extension of fusion L4-5 to L5-S1 with reinsertion of screws at L 4, left L5-S1 transforaminal lumbar interbody fusion, Exploration  left L5 nerve root, bilateral decompressive laminectomy L3-4 (N/A) HARDWARE REMOVAL Anemia of blood loss perioperatively. History of CVA in past and small vessel disease brain. Lethargy and difficulty participating with PT.   Advance  diet Up with therapy  Will transfuse 2 units of PRBCs. Recheck CBC in AM. Give Lasix 20 mg IV between units of blood.Discussed with [facility]. May need to consider SNF if no improvement post transfusion   Jessy Oto 11/02/2016, 4:35 PM Patient ID: [name], female   DOB: Dec 24, 1945, 71 y.o.   MRN: [id number]

## 2016-11-02 NOTE — Progress Notes (Signed)
PROGRESS NOTE    [name]  TMH:[id number] DOB: 1946-04-19 DOA: 10/30/2016 PCP: Alysia Penna, MD    Brief Narrative: [name] is a 71 year old female with a medical history significant for peripheral vascular disease, history of stroke/TIA, type II diabetes mellitus, hypertension, hyperlipidemia, and CAD s/p cardiac catheretization with stent placement who is recovering from elective spinal surgery on 10/30/16. Surgery included removal of left L5 pedicle screw, extension of fusion L4-5 to L5-S1 with left L5-S1 transforaminal lumbar interbody fusion, explore left L5 nerve root, and bilateral decompressive laminectomy L3-4 by Dr. Louanne Skye  that required prolonged anesthesia. Patient was obtunded on 4/25 and medicine team was consulted. CXR was suspicious for LLL pneumonia and patient was started on Levaquin on 4/26.   Subjective: Patient continues to rest comfortable sitting upright in the chair watching TV with her legs propped in front of her. Pain is well controlled. Denies difficulty breathing, chest pain, leg swelling, fever/chills.  Assessment & Plan:   Principal Problem:   Spondylolisthesis, lumbar region Active Problems:   Spinal stenosis of lumbar region   Retained orthopedic hardware   Spinal stenosis of lumbar region with neurogenic claudication   Lethargy   AKI (acute kidney injury) ([place])   Chronic diastolic CHF (congestive heart failure) (HCC)   Cough   Leukocytosis  Spondylolisthesis and spinal stenosis of lumbar region s/p spinal surgery POD #3: - On 4/24, patient underwent removal of left L5 pedicle screw, extension of fusion L4-5 to L5-S1 with left L5-S1 transforaminal lumbar interbody fusion, explore left L5 nerve root, and bilateral decompressive laminectomy L3-4 by Dr. Louanne Skye that required prolonged anesthesia  - Orthopedics is following patient and has plans to discharge home with home health when medicine team is ready  Lethargy - Has since resolved  since admission. Lethargy was likely due to combination of opioids and prolonged anesthesia during surgery. - Continue opioids as needed with close O2 monitoring for CNS depression. Give Robaxin sparingly.   Atelectasis vs pneumonia - Patient was started on Levaquin on 4/26 for suspected left lower lobe pneumonia and will continue for a total of 5 days treatment - O2 sat 100% on RA, afebrile without tachycardia, and improving leukocytosis 11.0  Acute kidney injury post operatively - Creatinine elevated likely due to hypoperfusion and prolonged anesthesia during surgery and is improving to 1.19 today - Continue to hold Lasix, Aldactone, Celebrex until kidney functioning improves in order to avoid nephrotoxic drugs  Diastolic CHF EF 92% w/ Grade 2 diastolic dysfunction (per ECHO on 12/2014) - Hold Lasix, Aldactone due to elevated creatinine, hold IV fluids - Monitor patient closely for fluid overload, trend Cr.   - Diastolic BP has been in the 30-60s since admission. Continue methyldopa, hydralazine, metoprolol for blood pressure control and monitor DBP closely.  Anemia - Patient has a history of microcytic anemia. Since admission, Hgb trending down in the setting of intraoperative blood loss and is 7.6 today. Anemia panel shows Iron 9, TIBC 228 indicating anemia of chronic disease.  - Will give ferrous sulfate 324 mg - Monitor CBC for possible transfusion if necessary  Leukocytosis/Thrombocytopenia - Leukocytosis improving 16.5 >> 14 >> 11 today likely in the setting of postop recovery and pneumonia treated with Levaquin - Platelets continue to trend down 147 >> 126 >> 111 >> 102 since admission likely in the setting of intraoperative blood loss. - Continue to monitor CBC closely  Diabetes mellitus type II - Continue SSI  CAD with a stent placement, History  of CVA/TIA - Continue aspirin and Crestor, nitro prn   DVT prophylaxis: per orthopedic management Code Status:  Full Family Communication: None at bedside Disposition Plan: Home with home health  Consultants:   Orthopedics  Procedures:   Spinal surgery by Dr. Louanne Skye 4/24  Antimicrobials:  Levaquin 4/26 >>  Objective: Vitals:   11/02/16 0500 11/02/16 0520 11/02/16 0700 11/02/16 0949  BP:  (!) 133/39  (!) 158/61  Pulse:  65  61  Resp:  18  18  Temp:  98.5 F (36.9 C)  98.5 F (36.9 C)  TempSrc:  Oral  Oral  SpO2:  95%  100%  Weight: 103.2 kg (227 lb 9.6 oz)  102.6 kg (226 lb 1.6 oz)   Height:        Intake/Output Summary (Last 24 hours) at 11/02/16 1120 Last data filed at 11/02/16 0800  Gross per 24 hour  Intake              660 ml  Output              775 ml  Net             -115 ml   Filed Weights   11/01/16 0440 11/02/16 0500 11/02/16 0700  Weight: 97.8 kg (215 lb 8 oz) 103.2 kg (227 lb 9.6 oz) 102.6 kg (226 lb 1.6 oz)    Examination:  General exam: Appears calm and comfortable  Respiratory system: Clear to auscultation. Respiratory effort normal. Cardiovascular system: S1 & S2 heard, RRR. No JVD, murmurs, rubs, gallops or clicks. No pedal edema. Gastrointestinal system: Abdomen is nondistended, soft and nontender. Normal bowel sounds heard. Central nervous system: Alert and oriented. No focal neurological deficits. Extremities: Symmetric 5 x 5 power. Skin: No rashes, lesions or ulcers Psychiatry: Judgement and insight appear normal. Mood & affect appropriate.     Data Reviewed: I have personally reviewed following labs and imaging studies  CBC:  Recent Labs Lab 10/30/16 1322 10/31/16 0924 10/31/16 1841 11/01/16 0759 11/02/16 0437  WBC  --  16.1* 16.5* 14.0* 11.0*  NEUTROABS  --   --  12.6*  --   --   HGB 8.5* 8.9* 8.7* 8.4* 7.6*  HCT 25.0* 27.6* 26.8* 25.3* 24.1*  MCV  --  84.4 85.1 84.1 84.9  PLT  --  147* 126* 111* 809*   Basic Metabolic Panel:  Recent Labs Lab 10/30/16 1322 10/31/16 0924 11/01/16 0759 11/02/16 0437  NA 143 141 138 139  K  3.2* 3.2* 3.5 3.4*  CL  --  107 108 108  CO2  --  26 21* 24  GLUCOSE 123* 145* 162* 125*  BUN  --  16 20 23*  CREATININE  --  1.34* 1.26* 1.19*  CALCIUM  --  7.9* 7.8* 8.1*   GFR: Estimated Creatinine Clearance: 52.8 mL/min (A) (by C-G formula based on SCr of 1.19 mg/dL (H)). Liver Function Tests:  Recent Labs Lab 11/01/16 0759  AST 31  ALT 10*  ALKPHOS 54  BILITOT 0.7  PROT 5.0*  ALBUMIN 2.9*   No results for input(s): LIPASE, AMYLASE in the last 168 hours. No results for input(s): AMMONIA in the last 168 hours. Coagulation Profile: No results for input(s): INR, PROTIME in the last 168 hours. Cardiac Enzymes: No results for input(s): CKTOTAL, CKMB, CKMBINDEX, TROPONINI in the last 168 hours. BNP (last 3 results) No results for input(s): PROBNP in the last 8760 hours. HbA1C:  Recent Labs  10/30/16 1641  HGBA1C 7.9*  CBG:  Recent Labs Lab 11/01/16 1153 11/01/16 1727 11/01/16 2136 11/02/16 0618 11/02/16 1117  GLUCAP 208* 146* 119* 125* 143*   Lipid Profile: No results for input(s): CHOL, HDL, LDLCALC, TRIG, CHOLHDL, LDLDIRECT in the last 72 hours. Thyroid Function Tests: No results for input(s): TSH, T4TOTAL, FREET4, T3FREE, THYROIDAB in the last 72 hours. Anemia Panel:  Recent Labs  11/02/16 0811  FERRITIN 80  TIBC 228*  IRON 9*   Sepsis Labs:  Recent Labs Lab 10/31/16 1232 11/02/16 0437  PROCALCITON 2.36 1.00    Recent Results (from the past 240 hour(s))  Surgical pcr screen     Status: None   Collection Time: 10/24/16  9:10 AM  Result Value Ref Range Status   MRSA, PCR NEGATIVE NEGATIVE Final   Staphylococcus aureus NEGATIVE NEGATIVE Final    Comment:        The Xpert SA Assay (FDA approved for NASAL specimens in patients over 65 years of age), is one component of a comprehensive surveillance program.  Test performance has been validated by [facility] for patients greater than or equal to 34 year old. It is not intended to  diagnose infection nor to guide or monitor treatment.          Radiology Studies: Dg Chest 2 View  Result Date: 11/01/2016 CLINICAL DATA:  Pneumonia and weakness EXAM: CHEST  2 VIEW COMPARISON:  Yesterday FINDINGS: Limited low volume chest. More symmetric aeration today, but still streaky density seen at the left base. No edema, effusion, or pneumothorax. Mild cardiomegaly. IMPRESSION: Low volume chest. Atelectasis or infection at the left base is improved from yesterday. Electronically Signed   By: Monte Fantasia M.D.   On: 11/01/2016 08:00   Dg Chest Port 1 View  Result Date: 10/31/2016 CLINICAL DATA:  Cough.  Recent lumbar fusion 10/30/2016. EXAM: PORTABLE CHEST 1 VIEW COMPARISON:  10/24/2016. FINDINGS: Low lung volumes. New retrocardiac opacity could represent developing infiltrate. Small LEFT effusion is also noted. RIGHT lung clear. Cardiomediastinal silhouette is increased due to low lung volumes. Prior cervical fusion. Cholecystectomy. IMPRESSION: Concern for early LEFT lower lobe pneumonia with effusion. Postoperative atelectasis not excluded. Electronically Signed   By: Staci Righter M.D.   On: 10/31/2016 12:10        Scheduled Meds: . aspirin EC  81 mg Oral Daily  . docusate sodium  100 mg Oral BID  . ferrous gluconate  324 mg Oral TID WC  . hydrALAZINE  100 mg Oral TID  . insulin aspart  0-15 Units Subcutaneous TID WC  . insulin aspart  4 Units Subcutaneous TID WC  . insulin glargine  5 Units Subcutaneous QHS  . irbesartan  300 mg Oral Daily  . isosorbide mononitrate  60 mg Oral Daily  . latanoprost  1 drop Both Eyes QHS  . levofloxacin  500 mg Oral Q24H  . methyldopa  250 mg Oral BID  . metoprolol succinate  100 mg Oral Daily  . montelukast  10 mg Oral Daily  . pantoprazole  40 mg Oral Daily  . potassium chloride  40 mEq Oral Daily  . rosuvastatin  40 mg Oral Daily  . sodium chloride flush  3 mL Intravenous Q12H  . [START ON 11/03/2016] Vitamin D  (Ergocalciferol)  50,000 Units Oral Q Sat   Continuous Infusions: . sodium chloride    . methocarbamol (ROBAXIN)  IV       LOS: 3 days    Time spent: 30 minutes    Jaydeen Odor  Elkin [place], PA-Student   If 7PM-7AM, please contact night-coverage [email or web address] Password [facility] 11/02/2016, 11:20 AM

## 2016-11-02 NOTE — Care Management Note (Signed)
Case Management Note  Patient Details  Name: BRITYN MASTROGIOVANNI MRN: [id number] Date of Birth: 12/19/1945  Subjective/Objective:                    Action/Plan: CM went by to discuss [facility] in case patient is ready for d/c over the weekend. Patient was sleeping so CM spoke to patients husband. CM provided him a list of [place] agencies. He selected [facility]. CM left message for Dian Situ with [facility].  Pt also with orders for walker and 3 in 1. Brad with [facility] notified of the need for the DME. CM following.  Expected Discharge Date:                  Expected Discharge Plan:  [place]  In-House Referral:     Discharge planning Services  CM Consult  Post Acute Care Choice:  Durable Medical Equipment, Home Health Choice offered to:  Spouse  DME Arranged:  3-N-1, Walker rolling DME Agency:  [place]:    [facility]:  [place]  Status of Service:  In process, will continue to follow  If discussed at Long Length of Stay Meetings, dates discussed:    Additional Comments:  Pollie Friar, RN 11/02/2016, 4:08 PM

## 2016-11-02 NOTE — Progress Notes (Signed)
Physical Therapy Treatment Patient Details Name: [name] MRN: [id number] DOB: [birth year] Today's Date: 11/02/2016    History of Present Illness Pt is a 71 y/o female s/p extension of fusion at L4-S1, L L5-S1 TLIF and bilateral decompressive laminectomy at L3-L4. PMH including but not limited to DM, CAD, HTN, PVD, hx of CVA/TIA and ACC in 2015.    PT Comments    Pt not making progress this session secondary to increased pain (reported 8/10) and lethargy. Pt only able to ambulate a very short distance within her room and then needing to sit down. Upon sitting down, pt was very lethargic and unable to participate in any further therapeutic interventions. Pt would continue to benefit from skilled physical therapy services at this time while admitted and after d/c to address the below listed limitations in order to improve overall safety and independence with functional mobility.    Follow Up Recommendations  Home health PT;Supervision/Assistance - 24 hour     Equipment Recommendations  Rolling walker with 5" wheels    Recommendations for Other Services       Precautions / Restrictions Precautions Precautions: Back;Fall Precaution Booklet Issued: No (OT gave pt handout) Precaution Comments: pt able to recall 2/3 back precautions. PT reviewed all precautions with pt and pt's spouse. Required Braces or Orthoses: Spinal Brace Spinal Brace: Lumbar corset;Applied in sitting position Restrictions Weight Bearing Restrictions: No    Mobility  Bed Mobility               General bed mobility comments: pt sitting OOB in recliner chair when therapist arrived  Transfers Overall transfer level: Needs assistance Equipment used: Rolling walker (2 wheeled) Transfers: Sit to/from Stand Sit to Stand: Min assist         General transfer comment: increased time, VC'ing for bilateral UE placement and min A for stability and to rise from recliner  chair  Ambulation/Gait Ambulation/Gait assistance: Min guard Ambulation Distance (Feet): 5 Feet Assistive device: Rolling walker (2 wheeled) Gait Pattern/deviations: Step-to pattern;Step-through pattern;Decreased step length - right;Decreased step length - left;Decreased stride length;Shuffle Gait velocity: decreased Gait velocity interpretation: Below normal speed for age/gender General Gait Details: pt only able to ambulate 5' at this time secondary to increased pain. pt reporting that she could not ambulate any further due to pain (reported 8/10). Upon sitting down, pt very lethargic again and unable to further participate in therapy session.   Stairs            Wheelchair Mobility    Modified Rankin (Stroke Patients Only)       Balance Overall balance assessment: Needs assistance Sitting-balance support: No upper extremity supported;Feet supported Sitting balance-Leahy Scale: Fair Sitting balance - Comments: no LOB with LB dressing   Standing balance support: During functional activity;Bilateral upper extremity supported Standing balance-Leahy Scale: Poor Standing balance comment: pt reliant on bilateral UEs on RW                            Cognition Arousal/Alertness: Lethargic;Suspect due to medications Behavior During Therapy: [facility] for tasks assessed/performed Overall Cognitive Status: Impaired/Different from baseline Area of Impairment: Memory;Safety/judgement                     Memory: Decreased recall of precautions;Decreased short-term memory   Safety/Judgement: Decreased awareness of safety;Decreased awareness of deficits   Problem Solving: Slow processing;Decreased initiation;Difficulty sequencing;Requires verbal cues;Requires tactile cues General Comments: pt needing repeated education  in back precautions and donning and doffing brace      Exercises      General Comments        Pertinent Vitals/Pain Pain Assessment:  0-10 Pain Score: 8  Faces Pain Scale: Hurts even more Pain Location: back and bilateral LEs Pain Descriptors / Indicators: Sore;Grimacing;Guarding;Moaning Pain Intervention(s): Monitored during session;Repositioned    Home Living                      Prior Function            PT Goals (current goals can now be found in the care plan section) Acute Rehab PT Goals Patient Stated Goal: return home PT Goal Formulation: With patient Time For Goal Achievement: 11/15/16 Potential to Achieve Goals: Fair Progress towards PT goals: Not progressing toward goals - comment (lethargy and pain)    Frequency    Min 5X/week      PT Plan Current plan remains appropriate    Co-evaluation             End of Session Equipment Utilized During Treatment: Gait belt;Back brace Activity Tolerance: Patient limited by lethargy;Patient limited by pain Patient left: in chair;with call bell/phone within reach;with chair alarm set;with family/visitor present Nurse Communication: Mobility status PT Visit Diagnosis: Other abnormalities of gait and mobility (R26.89)     Time: 4270-6237 PT Time Calculation (min) (ACUTE ONLY): 12 min  Charges:  $Gait Training: 8-22 mins                    G Codes:       [place], [place], [place] 11/02/2016, 1:45 PM

## 2016-11-02 NOTE — Progress Notes (Signed)
     Subjective: 3 Days Post-Op Procedure(s) (LRB): Removal of hardware rods and pedicle screws lumbar four, Extension of fusion L4-5 to L5-S1 with reinsertion of screws at L 4, left L5-S1 transforaminal lumbar interbody fusion, Exploration  left L5 nerve root, bilateral decompressive laminectomy L3-4 (N/A) HARDWARE REMOVAL Productive cough, intermittantly, no BM, tolerating pos, history of previous CVA, deafness sudden onset several months ago, seen by Dr. Erik Obey, ENT. PT and OT advance. CXR with pneumonia LLL. On levaquin for 5 days.  Patient reports pain as moderate.    Objective:   VITALS:  Temp:  [98.5 F (36.9 C)-99.7 F (37.6 C)] 98.5 F (36.9 C) (04/27 0520) Pulse Rate:  [62-73] 65 (04/27 0520) Resp:  [16-20] 18 (04/27 0520) BP: (100-133)/(34-83) 133/39 (04/27 0520) SpO2:  [93 %-100 %] 95 % (04/27 0520) Weight:  [226 lb 1.6 oz (102.6 kg)-227 lb 9.6 oz (103.2 kg)] 226 lb 1.6 oz (102.6 kg) (04/27 0700)  Neurologically intact ABD soft Neurovascular intact Sensation intact distally Intact pulses distally Dorsiflexion/Plantar flexion intact Incision: no drainage No cellulitis present Dressing changed, no erythrema, no fluctuance, no sign of infection.   LABS  Recent Labs  10/30/16 1322  10/31/16 0924 10/31/16 1841 11/01/16 0759 11/02/16 0437  HGB 8.5*  --  8.9* 8.7* 8.4* 7.6*  WBC  --   < > 16.1* 16.5* 14.0* 11.0*  PLT  --   < > 147* 126* 111* 102*  < > = values in this interval not displayed.  Recent Labs  11/01/16 0759 11/02/16 0437  NA 138 139  K 3.5 3.4*  CL 108 108  CO2 21* 24  BUN 20 23*  CREATININE 1.26* 1.19*  GLUCOSE 162* 125*   No results for input(s): LABPT, INR in the last 72 hours.   Assessment/Plan: 3 Days Post-Op Procedure(s) (LRB): Removal of hardware rods and pedicle screws lumbar four, Extension of fusion L4-5 to L5-S1 with reinsertion of screws at L 4, left L5-S1 transforaminal lumbar interbody fusion, Exploration  left L5 nerve  root, bilateral decompressive laminectomy L3-4 (N/A) HARDWARE REMOVAL  Anemia due to acute blood loss from surgery. Pneumonia, hospital aquired, ?aspiration during period of sedation.  Advance diet Up with therapy Plan for discharge tomorrow Discharge home with home health  Continue with levaquin. Will need FM evaluation for safe timing of discharge. Start Ferrous gluconate.  Jessy Oto 11/02/2016, 8:36 AM Patient ID: [name], female   DOB: April 01, 1946, 71 y.o.   MRN: [id number]

## 2016-11-02 NOTE — Progress Notes (Signed)
First unit of blood group  A positive completed at 2100 Vs stable no reaction noted Amount transfused 325 ml . Post transfusion VS T 99.5 BP 141/54 Hr  59 resp 18 O2 Sat 99 % RA no reaction noted Pt tolerated well.

## 2016-11-03 DIAGNOSIS — D500 Iron deficiency anemia secondary to blood loss (chronic): Secondary | ICD-10-CM

## 2016-11-03 LAB — TYPE AND SCREEN
ABO/RH(D): A POS
ANTIBODY SCREEN: NEGATIVE
UNIT DIVISION: 0
Unit division: 0
Unit division: 0

## 2016-11-03 LAB — BPAM RBC
BLOOD PRODUCT EXPIRATION DATE: 201805082359
Blood Product Expiration Date: 201805082359
Blood Product Expiration Date: 201805092359
ISSUE DATE / TIME: 201804241331
ISSUE DATE / TIME: 201804271712
ISSUE DATE / TIME: 201804272328
UNIT TYPE AND RH: 6200
UNIT TYPE AND RH: 6200
Unit Type and Rh: 6200

## 2016-11-03 LAB — CBC
HCT: 32.7 % — ABNORMAL LOW (ref 36.0–46.0)
Hemoglobin: 11 g/dL — ABNORMAL LOW (ref 12.0–15.0)
MCH: 28.4 pg (ref 26.0–34.0)
MCHC: 33.6 g/dL (ref 30.0–36.0)
MCV: 84.3 fL (ref 78.0–100.0)
PLATELETS: 165 10*3/uL (ref 150–400)
RBC: 3.88 MIL/uL (ref 3.87–5.11)
RDW: 15.4 % (ref 11.5–15.5)
WBC: 14.7 10*3/uL — ABNORMAL HIGH (ref 4.0–10.5)

## 2016-11-03 LAB — GLUCOSE, CAPILLARY
GLUCOSE-CAPILLARY: 177 mg/dL — AB (ref 65–99)
GLUCOSE-CAPILLARY: 216 mg/dL — AB (ref 65–99)
GLUCOSE-CAPILLARY: 156 mg/dL — AB (ref 65–99)
Glucose-Capillary: 125 mg/dL — ABNORMAL HIGH (ref 65–99)

## 2016-11-03 MED ORDER — HYDROCODONE-ACETAMINOPHEN 5-325 MG PO TABS: 1.0000 | ORAL_TABLET | Freq: Four times a day (QID) | ORAL | 0 refills | Status: DC | PRN

## 2016-11-03 MED ORDER — FERROUS GLUCONATE 324 (38 FE) MG PO TABS: 324.0000 mg | ORAL_TABLET | Freq: Three times a day (TID) | ORAL | 0 refills | Status: DC

## 2016-11-03 MED ORDER — SPIRONOLACTONE 25 MG PO TABS
25.0000 mg | ORAL_TABLET | Freq: Every day | ORAL | Status: DC
  Administered 2016-11-04: 25 mg via ORAL
  Filled 2016-11-03: qty 1

## 2016-11-03 MED ORDER — FUROSEMIDE 40 MG PO TABS
40.0000 mg | ORAL_TABLET | Freq: Every day | ORAL | Status: DC
  Administered 2016-11-03 – 2016-11-04 (×2): 40 mg via ORAL
  Filled 2016-11-03 (×2): qty 1

## 2016-11-03 MED ORDER — LEVOFLOXACIN 500 MG PO TABS: 500.0000 mg | ORAL_TABLET | ORAL | 0 refills | Status: DC

## 2016-11-03 MED ORDER — DOCUSATE SODIUM 100 MG PO CAPS: 100.0000 mg | ORAL_CAPSULE | Freq: Two times a day (BID) | ORAL | 0 refills | Status: DC

## 2016-11-03 NOTE — Progress Notes (Signed)
Patient ID: [name], female   DOB: 1945/08/18, 71 y.o.   MRN: [id number] Up and out of bed to bedside recliner she is more awake and alert today. LE NV normal some complaints of right lateral thigh numbness, may relate to laminectomies for tight stenosis at L3-4 bilaterlally, Hgb is 11.2 today Medicine has signed off today as she is stable post transfusion and pneumonia appears to be responding to treatment. PT to see her today and help with assessing her independence and capacity to go home with spouse vs SNF.  Will fill out paperwork and Rxs if she becomes able to be discharged. Last PT note was indicating lethargy and unable to  Participate in therapy.

## 2016-11-03 NOTE — Progress Notes (Signed)
Patient walked to and from the bathroom to the chair. States she is cannot walk the hall because she already walked with PT and OT. States  "I just want to go home" Pain meds requested and given to patient. Will continue to monitor.

## 2016-11-03 NOTE — Progress Notes (Signed)
   Subjective: 4 Days Post-Op Procedure(s) (LRB): Removal of hardware rods and pedicle screws lumbar four, Extension of fusion L4-5 to L5-S1 with reinsertion of screws at L 4, left L5-S1 transforaminal lumbar interbody fusion, Exploration  left L5 nerve root, bilateral decompressive laminectomy L3-4 (N/A) HARDWARE REMOVAL Patient reports pain as moderate.    Objective: Vital signs in last 24 hours: Temp:  [97.8 F (36.6 C)-99.6 F (37.6 C)] 98.5 F (36.9 C) (04/28 0537) Pulse Rate:  [53-62] 56 (04/28 0537) Resp:  [18] 18 (04/28 0537) BP: (121-158)/(45-94) 155/52 (04/28 0537) SpO2:  [93 %-100 %] 99 % (04/28 0537)  Intake/Output from previous day: 04/27 0701 - 04/28 0700 In: 2460 [P.O.:1130; I.V.:50; Blood:1280] Out: 2350 [Urine:2350] Intake/Output this shift: No intake/output data recorded.   Recent Labs  10/31/16 1841 11/01/16 0759 11/02/16 0437 11/03/16 0539  HGB 8.7* 8.4* 7.6* 11.0*    Recent Labs  11/02/16 0437 11/03/16 0539  WBC 11.0* 14.7*  RBC 2.84* 3.88  HCT 24.1* 32.7*  PLT 102* 165    Recent Labs  11/01/16 0759 11/02/16 0437  NA 138 139  K 3.5 3.4*  CL 108 108  CO2 21* 24  BUN 20 23*  CREATININE 1.26* 1.19*  GLUCOSE 162* 125*  CALCIUM 7.8* 8.1*   No results for input(s): LABPT, INR in the last 72 hours.  Neurologically intact No results found.  Assessment/Plan: 4 Days Post-Op Procedure(s) (LRB): Removal of hardware rods and pedicle screws lumbar four, Extension of fusion L4-5 to L5-S1 with reinsertion of screws at L 4, left L5-S1 transforaminal lumbar interbody fusion, Exploration  left L5 nerve root, bilateral decompressive laminectomy L3-4 (N/A) HARDWARE REMOVAL Up with therapy.   Hgb up after transfusion. She feels better. Continue ambulation, has not been in hall ambulating yet.   Marybelle Killings 11/03/2016, 9:43 AM

## 2016-11-03 NOTE — Progress Notes (Signed)
Occupational Therapy Treatment Patient Details Name: [name] MRN: [id number] DOB: [birth year] Today's Date: 11/03/2016    History of present illness Pt is a 71 y/o female s/p extension of fusion at L4-S1, L L5-S1 TLIF and bilateral decompressive laminectomy at L3-L4. PMH including but not limited to DM, CAD, HTN, PVD, hx of CVA/TIA and ACC in 2015.   OT comments  Pt progressing toward OT goals. She does remains lethargic and was asleep on OT entry requiring multiple attempts to waken. Pt able to complete toilet transfers and toilet hygiene with min guard assistance. Educated pt on home set-up to ensure that self-care items are within reach to prevent bending or twisting during toileting and bathing. Pt was able to verbalize 3/3 back precautions and demonstrate good body mechanics throughout session. She continues to demonstrate slow processing and decreased awareness of deficits but cognition is improving. Feel pt able to D/C home with 24 hour assistance initially and home health OT. Need to ensure that pt's family able to assist 24/7 as RN reports pt's husband is a Pharmacist, hospital. Will continue to follow acutely and update D/C recommendations as needed.    Follow Up Recommendations  Home health OT;Supervision/Assistance - 24 hour    Equipment Recommendations  3 in 1 bedside commode    Recommendations for Other Services      Precautions / Restrictions Precautions Precautions: Back;Fall Precaution Booklet Issued: No Precaution Comments: Pt able to verbalize and demonstrate 3/3 back precautions. Required Braces or Orthoses: Spinal Brace Spinal Brace: Lumbar corset;Applied in sitting position Restrictions Weight Bearing Restrictions: No       Mobility Bed Mobility Overal bed mobility: Needs Assistance Bed Mobility: Rolling;Sit to Sidelying Rolling: Supervision       Sit to sidelying: Min guard General bed mobility comments: Much increased time and effort to bring B LE's into bed  but no physical assistance needed.  Transfers Overall transfer level: Needs assistance Equipment used: Rolling walker (2 wheeled) Transfers: Sit to/from Stand Sit to Stand: Min guard Stand pivot transfers: Min guard       General transfer comment: Pt demonstrating good hand placement for safe ascent and descent to chair    Balance Overall balance assessment: Needs assistance Sitting-balance support: No upper extremity supported;Feet supported Sitting balance-Leahy Scale: Fair     Standing balance support: During functional activity;Bilateral upper extremity supported Standing balance-Leahy Scale: Fair Standing balance comment: pt stnading with RW at all times                           ADL either performed or assessed with clinical judgement   ADL Overall ADL's : Needs assistance/impaired                         Toilet Transfer: Ambulation;BSC;RW;Supervision/safety   Toileting- Clothing Manipulation and Hygiene: Min guard;Sit to/from stand Toileting - Clothing Manipulation Details (indicate cue type and reason): cue to avoid twisting     Functional mobility during ADLs: Rolling walker;Supervision/safety General ADL Comments: Pt educated on safe toilet transfers and toileting hygiene adhering to back precautions. Additionally educated pt on setting up her home so that regularly used items are in reach (especially soap/shampoo and toilet tissue) to prevent bending and twisting.     Vision   Vision Assessment?: No apparent visual deficits   Perception     Praxis      Cognition Arousal/Alertness: Lethargic Behavior During Therapy: WFL for tasks assessed/performed  Overall Cognitive Status: Impaired/Different from baseline Area of Impairment: Safety/judgement;Awareness                         Safety/Judgement: Decreased awareness of safety;Decreased awareness of deficits Awareness: Emergent Problem Solving: Slow processing General  Comments: Pt able to verbalize back precautions. Lethargic throughout session and slow to process. Pt becoming slightly agitated with RN who reported to pt that she may not D/C home today. Pt is eager to return home and has decreased awareness of her need for assistance at times.        Exercises     Shoulder Instructions       General Comments      Pertinent Vitals/ Pain       Pain Assessment: 0-10 Pain Score: 6  Pain Location: back Pain Descriptors / Indicators: Aching;Guarding Pain Intervention(s): Limited activity within patient's tolerance;Monitored during session;Repositioned  Home Living                                          Prior Functioning/Environment              Frequency  Min 2X/week        Progress Toward Goals  OT Goals(current goals can now be found in the care plan section)  Progress towards OT goals: Progressing toward goals  Acute Rehab OT Goals Patient Stated Goal: return home today OT Goal Formulation: With patient Time For Goal Achievement: 11/14/16 Potential to Achieve Goals: Good ADL Goals Pt Will Transfer to Toilet: with modified independence;ambulating (with RW; BSC over toilet) Pt Will Perform Toileting - Clothing Manipulation and hygiene: with modified independence;sit to/from stand Additional ADL Goal #1: Pt will recall 3/3 back precautions and give 2 examples of strategies to maintain during ADL with no verbal cues Additional ADL Goal #2: Pt will perform bed mobility maintaining precautions at supervision level as precursor to ADL. Additional ADL Goal #3: Pt will demonstrate competence in AE kit with 3 or less verbal cues for ADL completion  Plan Discharge plan remains appropriate    Co-evaluation                 End of Session Equipment Utilized During Treatment: Rolling walker;Back brace  OT Visit Diagnosis: Unsteadiness on feet (R26.81);Pain;Other symptoms and signs involving cognitive  function Pain - Right/Left: Right Pain - part of body: Leg (back)   Activity Tolerance Patient tolerated treatment well   Patient Left with call bell/phone within reach;in bed;with bed alarm set   Nurse Communication Mobility status;Precautions        Time: 5638-9373 OT Time Calculation (min): 28 min  Charges: OT General Charges $OT Visit: 1 Procedure OT Treatments $Self Care/Home Management : 23-37 mins  Norman Herrlich, MS OTR/L  Pager: [place] [facility] Quinnie Barcelo 11/03/2016, 2:16 PM

## 2016-11-03 NOTE — Progress Notes (Signed)
Physical Therapy Treatment Patient Details Name: [name] MRN: [id number] DOB: Mar 02, 1946 Today's Date: 11/03/2016    History of Present Illness Pt is a 71 y/o female s/p extension of fusion at L4-S1, L L5-S1 TLIF and bilateral decompressive laminectomy at L3-L4. PMH including but not limited to DM, CAD, HTN, PVD, hx of CVA/TIA and ACC in 2015.    PT Comments    Pt feeling much better today.  Still tires easily and moves slowly.  Pt wanting to go home today.  She says her husband and family will be with her with all mobilty (not family present during session).  I feel pt safe to discharge with family assist and [facility] services.  Acute care education complete.    Follow Up Recommendations  Home health PT;Supervision/Assistance - 24 hour     Equipment Recommendations  Rolling walker with 5" wheels    Recommendations for Other Services       Precautions / Restrictions Precautions Precautions: Back;Fall Precaution Booklet Issued: No Precaution Comments: Pt able to verbalize and demonstrate 3/3 back precautions. Required Braces or Orthoses: Spinal Brace Spinal Brace: Lumbar corset Restrictions Weight Bearing Restrictions: No    Mobility  Bed Mobility               General bed mobility comments: pt sitting OOB in recliner chair when therapist arrived  Transfers Overall transfer level: Needs assistance Equipment used: Rolling walker (2 wheeled) Transfers: Sit to/from [facility] Sit to Stand: Min guard Stand pivot transfers: Min guard       General transfer comment: it took pt extra time but abel to get up and down from [facility] and recliner on her own - u sing her UEs.  Ambulation/Gait Ambulation/Gait assistance: Min guard Ambulation Distance (Feet): 80 Feet Assistive device: Rolling walker (2 wheeled) Gait Pattern/deviations: Decreased stride length;Shuffle     General Gait Details: pt did well today -she is wanting to go home    Stairs             Wheelchair Mobility    Modified Rankin (Stroke Patients Only)       Balance               Standing balance comment: pt stnading with RW at all times                            Cognition Arousal/Alertness: Awake/alert Behavior During Therapy: WFL for tasks assessed/performed                                   General Comments: reviewed back precautions and body mechanics and brace donning and doffing and wear - family not present for education      Exercises      General Comments        Pertinent Vitals/Pain Pain Assessment: 0-10 Pain Score: 6  Pain Location: back Pain Descriptors / Indicators: Aching;Guarding Pain Intervention(s): Monitored during session;Repositioned    Home Living                      Prior Function            PT Goals (current goals can now be found in the care plan section) Progress towards PT goals: Progressing toward goals    Frequency    Min 5X/week      PT Plan  Current plan remains appropriate    Co-evaluation             End of Session Equipment Utilized During Treatment: Gait belt;Back brace Activity Tolerance: Patient tolerated treatment well Patient left: in chair;with call bell/phone within reach;with chair alarm set Nurse Communication: Mobility status PT Visit Diagnosis: Unsteadiness on feet (R26.81);Muscle weakness (generalized) (M62.81);Pain     Time: 9417-4081 PT Time Calculation (min) (ACUTE ONLY): 25 min  Charges:  $Gait Training: 23-37 mins                    G Codes:       13-Nov-2016   Rande Lawman, PT    Loyal Buba 11/13/2016, 1:12 PM

## 2016-11-03 NOTE — Progress Notes (Signed)
Second unit of Blood group A Positive completed  with  No reaction past transfusion VS within 30 min  Are Temp 97.8  HR 53  Res 18 BP 139/56 O2 Sat 98 % RA.

## 2016-11-03 NOTE — Progress Notes (Signed)
PROGRESS NOTE    TAMYRA FOJTIK  NOB:[id number] DOB: March 05, 1946 DOA: 10/30/2016 PCP: Alysia Penna, MD   Brief Narrative: 71 year old female with history of peripheral vascular disease, TIA, type 2 diabetes, hypertension, hyperlipidemia, coronary artery disease, chronic diastolic congestive heart failure status post back surgery by neurosurgeon, consulted internal medicine for the evaluation of lethargy and medical management.  Assessment & Plan:   #Lethargy likely in the setting of anesthesia and pain medication. Mentally status improved.  #Chest x-ray likely atelectasis vs LLL pneumonia. Not hypoxic. Continue Levaquin for a total  5 days course, last dose on 5/1. Continue incentive spirometer. Clinically improved.   #Acute kidney injury likely due to dehydration/prerenal in the setting of diuretics/surgery. Serum creatinine level improved.    # Hypertension: Continue to monitor blood pressure closely. Resume lasix  And aldactone today. Resume all home medication on discharge.  #Type 2 diabetes: Continue current insulin regimen for diabetes. Monitor blood sugar level.  #Thrombocytopenia of unknown etiology. No sign of bleeding. Continue to monitor. Platelet counts improved.  #Anemia likely multifactorial including iron deficiency and anemia of blood loss. Iron studies consistent with lowest stores. Started oral iron. No sign of bleeding. Received 2 prbc on 4/27 as per ortho team. Hb stable.   # Spine surgery as per ortho/primary team.    The patient is clinically improved. Hemoglobin is stable. Her mental status improved. I recommended patient to follow-up with her PCP and monitor blood sugar level, blood pressure at home. I will sign off at this time, please call us back if you have any question. Thank you very much for the consult!  Antimicrobials:levaquin since 4/26.  Subjective: Patient was seen and examined at bedside. Reported feeling good. Denied headache, dizziness, nausea,  vomiting, chest pain, shortness of breath.  Objective: Vitals:   11/02/16 2330 11/03/16 0005 11/03/16 0259 11/03/16 0537  BP: (!) 126/45 (!) 121/94 (!) 139/56 (!) 155/52  Pulse: (!) 57 (!) 54 (!) 53 (!) 56  Resp: 18 18 18 18   Temp: 98.6 F (37 C) 98.6 F (37 C) 97.8 F (36.6 C) 98.5 F (36.9 C)  TempSrc: Oral Oral Oral Oral  SpO2: 98% 96% 98% 99%  Weight:      Height:        Intake/Output Summary (Last 24 hours) at 11/03/16 0955 Last data filed at 11/03/16 0653  Gross per 24 hour  Intake             2340 ml  Output             2000 ml  Net              340 ml   Filed Weights   11/01/16 0440 11/02/16 0500 11/02/16 0700  Weight: 97.8 kg (215 lb 8 oz) 103.2 kg (227 lb 9.6 oz) 102.6 kg (226 lb 1.6 oz)    Examination:  General exam: Appears calm and comfortable  Respiratory system: Clear to auscultation. Respiratory effort normal. No wheezing or crackle Cardiovascular system: S1 & S2 heard, RRR.  No pedal edema. Gastrointestinal system: Abdomen is nondistended, soft and nontender. Normal bowel sounds heard. Skin: No rashes, lesions or ulcers Psychiatry: Judgement and insight appear normal. Mood & affect appropriate.     Data Reviewed: I have personally reviewed following labs and imaging studies  CBC:  Recent Labs Lab 10/31/16 0924 10/31/16 1841 11/01/16 0759 11/02/16 0437 11/03/16 0539  WBC 16.1* 16.5* 14.0* 11.0* 14.7*  NEUTROABS  --  12.6*  --   --   --  HGB 8.9* 8.7* 8.4* 7.6* 11.0*  HCT 27.6* 26.8* 25.3* 24.1* 32.7*  MCV 84.4 85.1 84.1 84.9 84.3  PLT 147* 126* 111* 102* 983   Basic Metabolic Panel:  Recent Labs Lab 10/30/16 1322 10/31/16 0924 11/01/16 0759 11/02/16 0437  NA 143 141 138 139  K 3.2* 3.2* 3.5 3.4*  CL  --  107 108 108  CO2  --  26 21* 24  GLUCOSE 123* 145* 162* 125*  BUN  --  16 20 23*  CREATININE  --  1.34* 1.26* 1.19*  CALCIUM  --  7.9* 7.8* 8.1*   GFR: Estimated Creatinine Clearance: 52.8 mL/min (A) (by C-G formula based  on SCr of 1.19 mg/dL (H)). Liver Function Tests:  Recent Labs Lab 11/01/16 0759  AST 31  ALT 10*  ALKPHOS 54  BILITOT 0.7  PROT 5.0*  ALBUMIN 2.9*   No results for input(s): LIPASE, AMYLASE in the last 168 hours. No results for input(s): AMMONIA in the last 168 hours. Coagulation Profile: No results for input(s): INR, PROTIME in the last 168 hours. Cardiac Enzymes: No results for input(s): CKTOTAL, CKMB, CKMBINDEX, TROPONINI in the last 168 hours. BNP (last 3 results) No results for input(s): PROBNP in the last 8760 hours. HbA1C: No results for input(s): HGBA1C in the last 72 hours. CBG:  Recent Labs Lab 11/02/16 1117 11/02/16 1617 11/02/16 2129 11/02/16 2352 11/03/16 0642  GLUCAP 143* 164* 171* 198* 177*   Lipid Profile: No results for input(s): CHOL, HDL, LDLCALC, TRIG, CHOLHDL, LDLDIRECT in the last 72 hours. Thyroid Function Tests: No results for input(s): TSH, T4TOTAL, FREET4, T3FREE, THYROIDAB in the last 72 hours. Anemia Panel:  Recent Labs  11/02/16 0811  FERRITIN 80  TIBC 228*  IRON 9*   Sepsis Labs:  Recent Labs Lab 10/31/16 1232 11/02/16 0437  PROCALCITON 2.36 1.00    No results found for this or any previous visit (from the past 240 hour(s)).       Radiology Studies: No results found.      Scheduled Meds: . aspirin EC  81 mg Oral Daily  . docusate sodium  100 mg Oral BID  . ferrous gluconate  324 mg Oral TID WC  . furosemide  40 mg Oral Daily  . hydrALAZINE  100 mg Oral TID  . insulin aspart  0-15 Units Subcutaneous TID WC  . insulin aspart  4 Units Subcutaneous TID WC  . insulin glargine  5 Units Subcutaneous QHS  . irbesartan  300 mg Oral Daily  . isosorbide mononitrate  60 mg Oral Daily  . latanoprost  1 drop Both Eyes QHS  . levofloxacin  500 mg Oral Q24H  . methyldopa  250 mg Oral BID  . metoprolol succinate  100 mg Oral Daily  . montelukast  10 mg Oral Daily  . pantoprazole  40 mg Oral Daily  . potassium chloride   40 mEq Oral Daily  . rosuvastatin  40 mg Oral Daily  . sodium chloride flush  3 mL Intravenous Q12H  . Vitamin D (Ergocalciferol)  50,000 Units Oral Q Sat   Continuous Infusions: . sodium chloride    . methocarbamol (ROBAXIN)  IV       LOS: 4 days    Dron Tanna Furry, MD [facility] Pager [phone]  If 7PM-7AM, please contact night-coverage [email or web address] Password [facility] 11/03/2016, 9:55 AM

## 2016-11-04 LAB — GLUCOSE, CAPILLARY
Glucose-Capillary: 157 mg/dL — ABNORMAL HIGH (ref 65–99)
Glucose-Capillary: 194 mg/dL — ABNORMAL HIGH (ref 65–99)

## 2016-11-04 LAB — PROCALCITONIN: Procalcitonin: 0.47 ng/mL

## 2016-11-04 NOTE — Progress Notes (Addendum)
   Subjective: 5 Days Post-Op Procedure(s) (LRB): Removal of hardware rods and pedicle screws lumbar four, Extension of fusion L4-5 to L5-S1 with reinsertion of screws at L 4, left L5-S1 transforaminal lumbar interbody fusion, Exploration  left L5 nerve root, bilateral decompressive laminectomy L3-4 (N/A) HARDWARE REMOVAL Patient reports pain as moderate.    Objective: Vital signs in last 24 hours: Temp:  [98 F (36.7 C)-99.5 F (37.5 C)] 99.2 F (37.3 C) (04/29 0626) Pulse Rate:  [54-63] 54 (04/29 0626) Resp:  [16-18] 18 (04/29 0626) BP: (140-174)/(45-61) 174/59 (04/29 0626) SpO2:  [94 %-98 %] 98 % (04/29 0626) Weight:  [219 lb (99.3 kg)] 219 lb (99.3 kg) (04/29 0626)  Intake/Output from previous day: 04/28 0701 - 04/29 0700 In: 690 [P.O.:690] Out: 270 [Urine:270] Intake/Output this shift: No intake/output data recorded.   Recent Labs  11/01/16 0759 11/02/16 0437 11/03/16 0539  HGB 8.4* 7.6* 11.0*    Recent Labs  11/02/16 0437 11/03/16 0539  WBC 11.0* 14.7*  RBC 2.84* 3.88  HCT 24.1* 32.7*  PLT 102* 165    Recent Labs  11/01/16 0759 11/02/16 0437  NA 138 139  K 3.5 3.4*  CL 108 108  CO2 21* 24  BUN 20 23*  CREATININE 1.26* 1.19*  GLUCOSE 162* 125*  CALCIUM 7.8* 8.1*   No results for input(s): LABPT, INR in the last 72 hours.  Neurologically intact No results found.  Assessment/Plan: 5 Days Post-Op Procedure(s) (LRB): Removal of hardware rods and pedicle screws lumbar four, Extension of fusion L4-5 to L5-S1 with reinsertion of screws at L 4, left L5-S1 transforaminal lumbar interbody fusion, Exploration  left L5 nerve root, bilateral decompressive laminectomy L3-4 (N/A) HARDWARE REMOVAL Up with therapy, needs to do stairs today then can go home. Rx on chart. Hgb 11 after transfusion . Renal labs improved.   [name] 11/04/2016, 7:38 AM

## 2016-11-04 NOTE — Progress Notes (Addendum)
D/c reviewed with patient. Dressing changed per order.Encouraged to adhere to med regimen, follow up appointments. No further questions at this time.

## 2016-11-04 NOTE — Progress Notes (Signed)
Physical Therapy Treatment Patient Details Name: [name] MRN: [id number] DOB: 1946-01-09 Today's Date: 11/04/2016    History of Present Illness Pt is a 71 y/o female s/p extension of fusion at L4-S1, L L5-S1 TLIF and bilateral decompressive laminectomy at L3-L4. PMH including but not limited to DM, CAD, HTN, PVD, hx of CVA/TIA and ACC in 2015.    PT Comments    Pt admitted with above diagnosis. Pt currently with functional limitations due to balaance and endurance deficits.  Pt was able to ambulate with RW and stair education completed.  Pt continues to need cues at times for walker safety however husband will assist her at home.  She also needs assist with her pants manipulation.  Pt will benefit from skilled PT to increase their independence and safety with mobility to allow discharge to the venue listed below.    Follow Up Recommendations  Home health PT;Supervision/Assistance - 24 hour     Equipment Recommendations  Rolling walker with 5" wheels    Recommendations for Other Services       Precautions / Restrictions Precautions Precautions: Back;Fall Precaution Booklet Issued: Yes (comment) Precaution Comments: Pt able to verbalize and demonstrate 3/3 back precautions. Required Braces or Orthoses: Spinal Brace Spinal Brace: Lumbar corset;Applied in sitting position Restrictions Weight Bearing Restrictions: No    Mobility  Bed Mobility               General bed mobility comments: in chair  Transfers Overall transfer level: Needs assistance Equipment used: Rolling walker (2 wheeled) Transfers: Sit to/from Stand Sit to Stand: Min guard         General transfer comment: Pt demonstrating good hand placement for safe ascent and descent to chair  Ambulation/Gait Ambulation/Gait assistance: Min guard Ambulation Distance (Feet): 200 Feet (100 feet x 2) Assistive device: Rolling walker (2 wheeled) Gait Pattern/deviations: Decreased stride  length;Shuffle;Step-through pattern;Trunk flexed;Wide base of support;Antalgic Gait velocity: decreased Gait velocity interpretation: Below normal speed for age/gender General Gait Details: Pt was able to ambulate into bathroom.  Needed asssit to pull down pants, to clean self and pull pants up.  Pt then ambulated in hallwaywith min guard asssit with occasional cues to stay close to RW.    Stairs Stairs: Yes   Stair Management: Step to pattern;Backwards;With walker Number of Stairs: 3 General stair comments: Pt was able to ascend and descend steps with cues only.  No assist needed.    Wheelchair Mobility    Modified Rankin (Stroke Patients Only)       Balance Overall balance assessment: Needs assistance Sitting-balance support: No upper extremity supported;Feet supported Sitting balance-Leahy Scale: Good Sitting balance - Comments: no LOB    Standing balance support: During functional activity;Bilateral upper extremity supported Standing balance-Leahy Scale: Fair Standing balance comment: pt standing with RW at all times                            Cognition Arousal/Alertness: Awake/alert Behavior During Therapy: WFL for tasks assessed/performed Overall Cognitive Status: Within Functional Limits for tasks assessed Area of Impairment: Safety/judgement;Awareness                         Safety/Judgement: Decreased awareness of safety Awareness: Emergent Problem Solving: Requires verbal cues;Difficulty sequencing General Comments: Pt able to verbalize back precautions.      Exercises      General Comments General comments (skin integrity, edema, etc.): All  education was completed and questions were answered.       Pertinent Vitals/Pain Pain Assessment: 0-10 Faces Pain Scale: Hurts whole lot Pain Location: back,right buttock Pain Descriptors / Indicators: Aching;Guarding Pain Intervention(s): Limited activity within patient's tolerance;Monitored  during session;Repositioned    Home Living                      Prior Function            PT Goals (current goals can now be found in the care plan section) Progress towards PT goals: Progressing toward goals    Frequency    Min 5X/week      PT Plan Current plan remains appropriate    Co-evaluation             End of Session Equipment Utilized During Treatment: Gait belt;Back brace Activity Tolerance: Patient limited by fatigue;Patient limited by pain Patient left: in chair;with call bell/phone within reach;with chair alarm set Nurse Communication: Mobility status PT Visit Diagnosis: Unsteadiness on feet (R26.81);Muscle weakness (generalized) (M62.81);Pain Pain - part of body:  (back)     Time: 0037-0488 PT Time Calculation (min) (ACUTE ONLY): 26 min  Charges:  $Gait Training: 8-22 mins $Self Care/Home Management: 8-22                    G Codes:       [name],PT Acute Rehabilitation [phone] (pager)    [name] 11/04/2016, 1:27 PM

## 2016-11-05 ENCOUNTER — Telehealth (INDEPENDENT_AMBULATORY_CARE_PROVIDER_SITE_OTHER): Payer: Self-pay | Admitting: Radiology

## 2016-11-05 ENCOUNTER — Ambulatory Visit (INDEPENDENT_AMBULATORY_CARE_PROVIDER_SITE_OTHER): Payer: Medicare Other | Admitting: Family Medicine

## 2016-11-05 ENCOUNTER — Telehealth (INDEPENDENT_AMBULATORY_CARE_PROVIDER_SITE_OTHER): Payer: Self-pay

## 2016-11-05 ENCOUNTER — Encounter: Payer: Self-pay | Admitting: Family Medicine

## 2016-11-05 VITALS — Temp 99.5°F

## 2016-11-05 DIAGNOSIS — J189 Pneumonia, unspecified organism: Secondary | ICD-10-CM

## 2016-11-05 DIAGNOSIS — Z981 Arthrodesis status: Secondary | ICD-10-CM

## 2016-11-05 DIAGNOSIS — J181 Lobar pneumonia, unspecified organism: Secondary | ICD-10-CM

## 2016-11-05 MED ORDER — LEVOFLOXACIN 500 MG PO TABS: 500.0000 mg | ORAL_TABLET | Freq: Every day | ORAL | 0 refills | Status: DC

## 2016-11-05 MED ORDER — ALBUTEROL SULFATE HFA 108 (90 BASE) MCG/ACT IN AERS: 2.0000 | INHALATION_SPRAY | RESPIRATORY_TRACT | 2 refills | Status: DC | PRN

## 2016-11-05 NOTE — Progress Notes (Signed)
   Subjective:    Patient ID: [name], female    DOB: 1945-09-09, 71 y.o.   MRN: [id number]  HPI Here for a cough producing yellow sputum and SOB. No fever that she knows of. She was sent home yesterday from the hospital after spinal surgery per Dr. Louanne Skye. She had a cough in the hospital and a CXR identified an infiltrate at the left base. She was treated with a few days of Levaquin. Today she is weak and still has a cough.    Review of Systems  Constitutional: Positive for fatigue.  Respiratory: Positive for cough and wheezing.   Cardiovascular: Negative.        Objective:   Physical Exam  Constitutional: She is oriented to person, place, and time.  In a wheelchair, in pain  Cardiovascular: Normal rate, regular rhythm, normal heart sounds and intact distal pulses.   Pulmonary/Chest: Effort normal and breath sounds normal. No respiratory distress. She has no rales.  Scattered wheezes   Neurological: She is alert and oriented to person, place, and time.          Assessment & Plan:  LLL pneumonia. We will treat with 10 days of Levaquin. Use an albuterol inhaler prn.  Alysia Penna, MD

## 2016-11-05 NOTE — Telephone Encounter (Signed)
Patient husband stated that patient would like to go to rehab ASAP.  He wasn't sure if Dr. Louanne Skye needed to refer her or not.  If so, she would like to go to [facility] in [place]. Patient was discharged from hospital on yesterday 11/04/16.  CB# is [phone]

## 2016-11-05 NOTE — Progress Notes (Signed)
Pre visit review using our clinic review tool, if applicable. No additional management support is needed unless otherwise documented below in the visit note. Pt unable to stand and weigh.   

## 2016-11-05 NOTE — Telephone Encounter (Signed)
[name] states that he was told patient would be discharged from the hospital this weekend and that Nanine Means would be getting orders for Home Health. He has not seen any orders yet and would like for Dr. Louanne Skye to advise if he indeed wants home health on the patient. Please call [name] back to advise.  [phone]

## 2016-11-05 NOTE — Patient Instructions (Signed)
WE NOW OFFER   [facility] FAST TRACK!!!  SAME DAY Appointments for ACUTE CARE  Such as: Sprains, Injuries, cuts, abrasions, rashes, muscle pain, joint pain, back pain Colds, flu, sore throats, headache, allergies, cough, fever  Ear pain, sinus and eye infections Abdominal pain, nausea, vomiting, diarrhea, upset stomach Animal/insect bites  3 Easy Ways to Schedule: Walk-In Scheduling Call in scheduling Mychart Sign-up: [email or web address]         

## 2016-11-06 NOTE — Telephone Encounter (Signed)
I have gotten 2 messages on this patient...  Dian Situ states that he was told patient would be discharged from the hospital this weekend and that Nanine Means would be getting orders for Home Health. He has not seen any orders yet and would like for Dr. Louanne Skye to advise if he indeed wants home health on the patient. Please call Dian Situ back to advise.  [phone]

## 2016-11-06 NOTE — Telephone Encounter (Signed)
Patient husband stated that patient would like to go to rehab ASAP.  He wasn't sure if Dr. Louanne Skye needed to refer her or not.  If so, she would like to go to [facility] in [place]. Patient was discharged from hospital on yesterday 11/04/16.  CB# is [phone]

## 2016-11-07 DIAGNOSIS — L508 Other urticaria: Secondary | ICD-10-CM | POA: Diagnosis not present

## 2016-11-09 ENCOUNTER — Ambulatory Visit (INDEPENDENT_AMBULATORY_CARE_PROVIDER_SITE_OTHER): Payer: Medicare Other

## 2016-11-09 ENCOUNTER — Encounter (INDEPENDENT_AMBULATORY_CARE_PROVIDER_SITE_OTHER): Payer: Self-pay | Admitting: Specialist

## 2016-11-09 ENCOUNTER — Other Ambulatory Visit (INDEPENDENT_AMBULATORY_CARE_PROVIDER_SITE_OTHER): Payer: Self-pay | Admitting: Specialist

## 2016-11-09 ENCOUNTER — Ambulatory Visit (INDEPENDENT_AMBULATORY_CARE_PROVIDER_SITE_OTHER): Payer: Medicare Other | Admitting: Specialist

## 2016-11-09 ENCOUNTER — Telehealth (INDEPENDENT_AMBULATORY_CARE_PROVIDER_SITE_OTHER): Payer: Self-pay

## 2016-11-09 VITALS — BP 143/60 | HR 91 | Temp 98.0°F | Wt 219.0 lb

## 2016-11-09 DIAGNOSIS — M545 Low back pain, unspecified: Secondary | ICD-10-CM

## 2016-11-09 DIAGNOSIS — M4326 Fusion of spine, lumbar region: Secondary | ICD-10-CM

## 2016-11-09 DIAGNOSIS — I2510 Atherosclerotic heart disease of native coronary artery without angina pectoris: Secondary | ICD-10-CM | POA: Diagnosis not present

## 2016-11-09 DIAGNOSIS — I10 Essential (primary) hypertension: Secondary | ICD-10-CM | POA: Diagnosis not present

## 2016-11-09 DIAGNOSIS — M797 Fibromyalgia: Secondary | ICD-10-CM | POA: Diagnosis not present

## 2016-11-09 DIAGNOSIS — E1151 Type 2 diabetes mellitus with diabetic peripheral angiopathy without gangrene: Secondary | ICD-10-CM | POA: Diagnosis not present

## 2016-11-09 DIAGNOSIS — Z4789 Encounter for other orthopedic aftercare: Secondary | ICD-10-CM | POA: Diagnosis not present

## 2016-11-09 DIAGNOSIS — Z8673 Personal history of transient ischemic attack (TIA), and cerebral infarction without residual deficits: Secondary | ICD-10-CM | POA: Diagnosis not present

## 2016-11-09 DIAGNOSIS — F329 Major depressive disorder, single episode, unspecified: Secondary | ICD-10-CM | POA: Diagnosis not present

## 2016-11-09 DIAGNOSIS — H9192 Unspecified hearing loss, left ear: Secondary | ICD-10-CM | POA: Diagnosis not present

## 2016-11-09 DIAGNOSIS — Z794 Long term (current) use of insulin: Secondary | ICD-10-CM | POA: Diagnosis not present

## 2016-11-09 DIAGNOSIS — Z9889 Other specified postprocedural states: Secondary | ICD-10-CM

## 2016-11-09 DIAGNOSIS — M1990 Unspecified osteoarthritis, unspecified site: Secondary | ICD-10-CM | POA: Diagnosis not present

## 2016-11-09 DIAGNOSIS — Z981 Arthrodesis status: Secondary | ICD-10-CM | POA: Diagnosis not present

## 2016-11-09 MED ORDER — HYDROCODONE-ACETAMINOPHEN 5-325 MG PO TABS: 1.0000 | ORAL_TABLET | Freq: Four times a day (QID) | ORAL | 0 refills | Status: DC | PRN

## 2016-11-09 NOTE — Telephone Encounter (Signed)
Patient had called wanting to know where he could go to get Rx filled, stated he had been to about 5 pharmacies and some of them where out of stock.  Cb # is [phone].  I advised him to try [facility].

## 2016-11-09 NOTE — Telephone Encounter (Signed)
Seen in office today and a new order for [facility] placed, husband contacted [facility] while in office and they are going to see her today, nearly one week post discharge from [facility]

## 2016-11-09 NOTE — Progress Notes (Signed)
Post-Op Visit Note   Patient: [name]           Date of Birth: 1946/01/18           MRN: [id number] Visit Date: 11/09/2016 PCP: Alysia Penna, MD   Assessment & Plan: 11 days following lumbar decompression and fusion extended form previous L4-5 to S1 with removal of left L5 pedicle screw and decompression at the L3-4 level for spinal stenosis and L5-S1 spondylolisthesis levels adjacent to L4-5 previous fusion. Anemia post op requiring 2 units transfusion For Hgb 7.2, history of TIAs, microvascular small blood vessel disease.   Had rash and itching which she is not sure of medication causing The reaction. Legs are NV normal.  Incision is healing without drainage or erythrema, no sign of infection.  Legs N-V normal. Motor is normal. And sensory is normal.  Chief Complaint:  Chief Complaint  Patient presents with  . Lower Back - Follow-up  . other    1 week post op removal of hardware, pt is in pain, pain scale 6/10   Visit Diagnoses:  1. Lumbar pain     Plan: HHN for PT and OT to work on gait training. Working with [place] to have PT.  Avoid frequent bending and stooping  No lifting greater than 10 lbs. May use ice or moist heat for pain. Weight loss is of benefit. Handicap license is approved. May shower removing the brace and bandaige temporarily then dry and reapply the brace and bandage Ordered name brand vicodin for pain as previous brand of hydrocodone may have caused rash Follow-Up Instructions: No Follow-up on file.   Orders:  Orders Placed This Encounter  Procedures  . XR Lumbar Spine 2-3 Views   No orders of the defined types were placed in this encounter.   Imaging: No results found.  PMFS History: Patient Active Problem List   Diagnosis Date Noted  . Anemia due to blood loss 11/02/2016    Priority: High    Class: Acute  . Spondylolisthesis, lumbar region 10/30/2016    Priority: High    Class: Chronic  . Spinal stenosis of lumbar  region 10/30/2016    Priority: High    Class: Chronic  . Retained orthopedic hardware 10/30/2016    Priority: High    Class: Chronic  . History of lumbar spinal fusion   . History of fusion of lumbar spine   . Pneumonia   . Lethargy 10/31/2016  . AKI (acute kidney injury) ([place]) 10/31/2016  . Chronic diastolic CHF (congestive heart failure) ([place]) 10/31/2016  . Cough   . Leukocytosis   . Spinal stenosis of lumbar region with neurogenic claudication 10/30/2016  . Genetic testing 09/14/2015  . Atypical ductal hyperplasia of left breast 08/24/2015  . Family history of breast cancer   . Family history of prostate cancer   . Family history of stomach cancer   . Abnormal nuclear stress test 12/29/2014  . Cervical spondylosis with myelopathy and radiculopathy 06/11/2014  . Carpal tunnel syndrome 05/14/2014  . Aftercare following surgery of the circulatory system, [place] 03/11/2014  . Pain of right lower extremity 03/11/2014  . Atypical lobular hyperplasia of left breast 02/12/2014  . Fibromyalgia 09/21/2013  . Occlusion and stenosis of carotid artery without mention of cerebral infarction 02/29/2012  . Back abscess 01/22/2012  . Chest pain with moderate risk of acute coronary syndrome 12/09/2009  . Shortness of breath 06/10/2009  . Orthostatic hypotension 01/31/2009  . CAD S/P RCA DES  June 2010 12/23/2008  . Normocytic anemia 07/21/2008  . Headache(784.0) 07/21/2008  . History of TIA (transient ischemic attack) 07/21/2008  . Acute bronchitis 04/20/2008  . Edema 02/27/2008  . Diabetes mellitus with complication ([place]) 88/50/2774  . Depression 07/16/2007  . Dyslipidemia 02/21/2007  . Essential hypertension 02/21/2007  . ASTHMA 02/21/2007  . GERD 02/21/2007  . LOW BACK PAIN 02/21/2007   Past Medical History:  Diagnosis Date  . Abnormal nuclear stress test 12/29/2014  . Allergy   . Anemia, unspecified   . Arthritis   . CAD (coronary artery disease)    sees. Dr. Burt Knack. s/p cath  with PCI of RCA (xience des) June 2010. Pt also with LAD and D2 dzs-NL FFR in both areas med Rx  . Carotid artery occlusion   . Cataract    sees Dr. Herbert Deaner   . Depressive disorder, not elsewhere classified   . Duodenal nodule   . Edema   . Fibromyalgia   . Gastric polyps    COLON  . GERD (gastroesophageal reflux disease)   . Glaucoma    narrow angle, sees Dr. Herbert Deaner   . Headache(784.0)   . Hearing loss    left ear  . Hemorrhoids   . HSV (herpes simplex virus) anogenital infection 05/2014  . HTN (hypertension)   . Hyperlipidemia   . Low back pain   . Obesity   . Peripheral vascular disease ([place])   . Stroke [facility])    TIA  "many yrs ago"    . TIA (transient ischemic attack)    also Hx of it.   . Type II or unspecified type diabetes mellitus without mention of complication, not stated as uncontrolled    sees Dr. Carrolyn Meiers     Family History  Problem Relation Age of Onset  . Hypertension Mother   . Hypertension Sister   . Hyperlipidemia Sister   . Hypertension Brother   . Cancer Brother     PROSTATE/LUNG  . Diabetes Brother   . Heart disease Brother     Before age 12 - Bypass  . Varicose Veins Brother   . Cancer Father     Prostate and pancreatic   . Stomach cancer Maternal Grandmother 93  . Heart disease Maternal Grandfather   . Breast cancer Maternal Aunt 25  . Cancer Maternal Aunt     Stomach  . Breast cancer Cousin 19    maternal first cousin  . Prostate cancer Cousin 67  . Breast cancer Cousin 7  . Breast cancer Cousin 40    maternal first cousin's daughter  . Coronary artery disease Neg Hx     premature CAD  . Colon cancer Neg Hx   . Esophageal cancer Neg Hx     Past Surgical History:  Procedure Laterality Date  . ABDOMINAL HYSTERECTOMY  age 13   TAH and USO  Leiomyomata  . ANTERIOR CERVICAL CORPECTOMY N/A 06/11/2014   Procedure: ANTERIOR CERVICAL CORPECTOMY CERVICAL SIX; WITH CERVICAL FIVE TO CERVICAL SEVEN ARTHRODESIS;  Surgeon: Ashok Pall, MD;   Location: [place] [facility];  Service: Neurosurgery;  Laterality: N/A;  . BACK SURGERY  2008   lumb fusion  . BREAST BIOPSY Left 01/26/2014   Procedure: EXCISION LEFT BREAST MASS;  Surgeon: Adin Hector, MD;  Location: [place];  Service: General;  Laterality: Left;  . CARDIAC CATHETERIZATION    . CARDIAC CATHETERIZATION N/A 12/29/2014   Procedure: Left Heart Cath and Coronary Angiography;  Surgeon: Sherren Mocha, MD;  Location: [place] CV LAB;  Service: Cardiovascular;  Laterality: N/A;  . CAROTID ENDARTERECTOMY  march 2012   per Dr. Morton Amy  . CARPAL TUNNEL RELEASE Left 05/14/2014   Procedure: Left Carpal tunnel release;  Surgeon: Ashok Pall, MD;  Location: [place] [facility] OR[facility];  Service: Neurosurgery;  Laterality: Left;  Left Carpal tunnel release  . CARPAL TUNNEL RELEASE Bilateral   . CHOLECYSTECTOMY    . COLONOSCOPY  05-29-11   per Dr. Henrene Pastor, benign polyps, repeat in 5 yrs    . DILATION AND CURETTAGE OF UTERUS    . ESOPHAGOGASTRODUODENOSCOPY  02-08-06   per Dr. Henrene Pastor, gastritis   . EYE SURGERY Left June 2016   Cataract  . GLAUCOMA SURGERY     with laser, per Dr. Henrene Pastor   . HARDWARE REMOVAL  10/30/2016   Procedure: HARDWARE REMOVAL;  Surgeon: Jessy Oto, MD;  Location: [place];  Service: Orthopedics;;  . LUMBAR FUSION  2008  . POSTERIOR LUMBAR FUSION  10/30/2016   Removal of hardware rods and pedicle screws lumbar four, Extension of fusion L4-5 to L5-S1 with reinsertion of screws at L 4, left L5-S1 transforaminal lumbar interbody fusion, Exploration  left L5 nerve root, bilateral decompressive laminectomy L3-4/notes 10/30/2016  . ULNAR NERVE TRANSPOSITION Left 05/14/2014   Procedure: Left Ulnar nerve decompression;  Surgeon: Ashok Pall, MD;  Location: [place] NEUR[facility] ORS;  Service: Neurosurgery;  Laterality: Left;  Left Ulnar nerve decompression   Social History   Occupational History  . Not on file.   Social History Main Topics  . Smoking status: Former Smoker     Quit date: 01/22/1967  . Smokeless tobacco: Never Used  . Alcohol use No  . Drug use: No  . Sexual activity: No     Comment: HYST-1st intercourse 26 yo-5 partners

## 2016-11-09 NOTE — Patient Instructions (Signed)
Avoid frequent bending and stooping  No lifting greater than 10 lbs. May use ice or moist heat for pain. Weight loss is of benefit. Handicap license is approved. May shower removing the brace and bandaige temporarily then dry and reapply the brace and bandage Ordered name brand vicodin for pain as previous brand of hydrocodone may have caused rash.

## 2016-11-12 ENCOUNTER — Telehealth (INDEPENDENT_AMBULATORY_CARE_PROVIDER_SITE_OTHER): Payer: Self-pay | Admitting: Specialist

## 2016-11-12 NOTE — Telephone Encounter (Signed)
Not as easily done after leaving the hospital. I think that a rehab program would be helpful but she also had a surgery that can be rehabilitated at home with home health. Needs to be seen by Home Health and undergo PT and OT to rehabilitate from single level extension of her fusion with upper level laminectomy. Seen in office 7/4 and discussed with the patient and husband.

## 2016-11-12 NOTE — Telephone Encounter (Signed)
PT THERAPIST WITH [facility] CALLING FOR VERBAL FOR HH PT 1 WK 1 AND 2 WK 3  DAVID-[phone]

## 2016-11-12 NOTE — Telephone Encounter (Signed)
Pt is getting HHpt

## 2016-11-12 NOTE — Telephone Encounter (Signed)
Pt is getting HHPT

## 2016-11-12 NOTE — Telephone Encounter (Signed)
I called and gave Shanon Brow the Verbal ok for these orders

## 2016-11-13 DIAGNOSIS — Z981 Arthrodesis status: Secondary | ICD-10-CM | POA: Diagnosis not present

## 2016-11-13 DIAGNOSIS — M797 Fibromyalgia: Secondary | ICD-10-CM | POA: Diagnosis not present

## 2016-11-13 DIAGNOSIS — Z4789 Encounter for other orthopedic aftercare: Secondary | ICD-10-CM | POA: Diagnosis not present

## 2016-11-13 DIAGNOSIS — M1990 Unspecified osteoarthritis, unspecified site: Secondary | ICD-10-CM | POA: Diagnosis not present

## 2016-11-13 DIAGNOSIS — E1151 Type 2 diabetes mellitus with diabetic peripheral angiopathy without gangrene: Secondary | ICD-10-CM | POA: Diagnosis not present

## 2016-11-13 DIAGNOSIS — F329 Major depressive disorder, single episode, unspecified: Secondary | ICD-10-CM | POA: Diagnosis not present

## 2016-11-15 ENCOUNTER — Other Ambulatory Visit (INDEPENDENT_AMBULATORY_CARE_PROVIDER_SITE_OTHER): Payer: Self-pay | Admitting: Radiology

## 2016-11-15 ENCOUNTER — Telehealth (INDEPENDENT_AMBULATORY_CARE_PROVIDER_SITE_OTHER): Payer: Self-pay | Admitting: Radiology

## 2016-11-15 DIAGNOSIS — Z981 Arthrodesis status: Secondary | ICD-10-CM | POA: Diagnosis not present

## 2016-11-15 DIAGNOSIS — Z969 Presence of functional implant, unspecified: Secondary | ICD-10-CM

## 2016-11-15 DIAGNOSIS — Z4789 Encounter for other orthopedic aftercare: Secondary | ICD-10-CM | POA: Diagnosis not present

## 2016-11-15 DIAGNOSIS — M797 Fibromyalgia: Secondary | ICD-10-CM | POA: Diagnosis not present

## 2016-11-15 DIAGNOSIS — M1990 Unspecified osteoarthritis, unspecified site: Secondary | ICD-10-CM | POA: Diagnosis not present

## 2016-11-15 DIAGNOSIS — E1151 Type 2 diabetes mellitus with diabetic peripheral angiopathy without gangrene: Secondary | ICD-10-CM | POA: Diagnosis not present

## 2016-11-15 DIAGNOSIS — F329 Major depressive disorder, single episode, unspecified: Secondary | ICD-10-CM | POA: Diagnosis not present

## 2016-11-15 NOTE — Telephone Encounter (Signed)
She has been to multiple pharmacies and no one has Vicodin anymore. She needs to know what to do and if Dr. Louanne Skye knows where she could get it. ---Please advise   As far as the blood work part of the message, this has been taken care of. [name] has put in the order and [name] will call her.

## 2016-11-15 NOTE — Telephone Encounter (Signed)
I called patient and discussed lab times and location for [facility]. I did advise [name] would return her call in regards to pain medication.

## 2016-11-15 NOTE — Telephone Encounter (Signed)
I called patient and gave directions and lab times for [facility].

## 2016-11-15 NOTE — Telephone Encounter (Signed)
Patient called with two concerns: 1. She has been to multiple pharmacies and no one has Vicodin anymore. She needs to know what to do and if Dr. Louanne Skye knows where she could get it.   2. She was here and we were unable to draw her blood. She needs to know what is being done about that.  Please call patient as soon as possible. [phone]

## 2016-11-16 ENCOUNTER — Other Ambulatory Visit (INDEPENDENT_AMBULATORY_CARE_PROVIDER_SITE_OTHER): Payer: Self-pay | Admitting: Specialist

## 2016-11-16 MED ORDER — TRAMADOL HCL 50 MG PO TABS: 100.0000 mg | ORAL_TABLET | Freq: Four times a day (QID) | ORAL | 0 refills | Status: DC | PRN

## 2016-11-16 NOTE — Telephone Encounter (Signed)
Unable to help with where she can find vicodin brand of hydrocodone, will just try tramadol then for pain control and stop hydrocodone. Rx for tramadol printed and can be faxed or called to her pharmacy. jen

## 2016-11-19 ENCOUNTER — Other Ambulatory Visit: Payer: Self-pay

## 2016-11-19 DIAGNOSIS — F329 Major depressive disorder, single episode, unspecified: Secondary | ICD-10-CM | POA: Diagnosis not present

## 2016-11-19 DIAGNOSIS — M1990 Unspecified osteoarthritis, unspecified site: Secondary | ICD-10-CM | POA: Diagnosis not present

## 2016-11-19 DIAGNOSIS — Z981 Arthrodesis status: Secondary | ICD-10-CM | POA: Diagnosis not present

## 2016-11-19 DIAGNOSIS — Z4789 Encounter for other orthopedic aftercare: Secondary | ICD-10-CM | POA: Diagnosis not present

## 2016-11-19 DIAGNOSIS — E1151 Type 2 diabetes mellitus with diabetic peripheral angiopathy without gangrene: Secondary | ICD-10-CM | POA: Diagnosis not present

## 2016-11-19 DIAGNOSIS — M797 Fibromyalgia: Secondary | ICD-10-CM | POA: Diagnosis not present

## 2016-11-19 DIAGNOSIS — Z969 Presence of functional implant, unspecified: Secondary | ICD-10-CM

## 2016-11-19 LAB — CBC WITH DIFFERENTIAL/PLATELET
BASOS ABS: 0 {cells}/uL (ref 0–200)
Basophils Relative: 0 %
EOS PCT: 5 %
Eosinophils Absolute: 410 cells/uL (ref 15–500)
HCT: 34.7 % — ABNORMAL LOW (ref 35.0–45.0)
HEMOGLOBIN: 10.9 g/dL — AB (ref 11.7–15.5)
LYMPHS ABS: 1968 {cells}/uL (ref 850–3900)
Lymphocytes Relative: 24 %
MCH: 27 pg (ref 27.0–33.0)
MCHC: 31.4 g/dL — ABNORMAL LOW (ref 32.0–36.0)
MCV: 85.9 fL (ref 80.0–100.0)
MPV: 12.4 fL (ref 7.5–12.5)
Monocytes Absolute: 328 cells/uL (ref 200–950)
Monocytes Relative: 4 %
NEUTROS ABS: 5494 {cells}/uL (ref 1500–7800)
Neutrophils Relative %: 67 %
Platelets: 317 10*3/uL (ref 140–400)
RBC: 4.04 MIL/uL (ref 3.80–5.10)
RDW: 14.6 % (ref 11.0–15.0)
WBC: 8.2 10*3/uL (ref 3.8–10.8)

## 2016-11-19 NOTE — Telephone Encounter (Signed)
Called to [facility] is aware and she is going today to get labs done

## 2016-11-20 NOTE — Discharge Summary (Signed)
Patient ID: [name] MRN: [id number] DOB/AGE: 1946/01/07 71 y.o.  Admit date: 10/30/2016 Discharge date: 11/20/2016  Admission Diagnoses:  Principal Problem:   Spondylolisthesis, lumbar region Active Problems:   Spinal stenosis of lumbar region   Retained orthopedic hardware   Spinal stenosis of lumbar region with neurogenic claudication   Lethargy   AKI (acute kidney injury) ([place])   Chronic diastolic CHF (congestive heart failure) (HCC)   Cough   Leukocytosis   Pneumonia   Anemia due to blood loss   History of lumbar spinal fusion   History of fusion of lumbar spine   Discharge Diagnoses:  Principal Problem:   Spondylolisthesis, lumbar region Active Problems:   Spinal stenosis of lumbar region   Retained orthopedic hardware   Spinal stenosis of lumbar region with neurogenic claudication   Lethargy   AKI (acute kidney injury) ([place])   Chronic diastolic CHF (congestive heart failure) (HCC)   Cough   Leukocytosis   Pneumonia   Anemia due to blood loss   History of lumbar spinal fusion   History of fusion of lumbar spine  status post Procedure(s): Removal of hardware rods and pedicle screws lumbar four, Extension of fusion L4-5 to L5-S1 with reinsertion of screws at L 4, left L5-S1 transforaminal lumbar interbody fusion, Exploration  left L5 nerve root, bilateral decompressive laminectomy L3-4 HARDWARE REMOVAL  Past Medical History:  Diagnosis Date  . Abnormal nuclear stress test 12/29/2014  . Allergy   . Anemia, unspecified   . Arthritis   . CAD (coronary artery disease)    sees. Dr. Burt Knack. s/p cath with PCI of RCA (xience des) June 2010. Pt also with LAD and D2 dzs-NL FFR in both areas med Rx  . Carotid artery occlusion   . Cataract    sees Dr. Herbert Deaner   . Depressive disorder, not elsewhere classified   . Duodenal nodule   . Edema   . Fibromyalgia   . Gastric polyps    COLON  . GERD (gastroesophageal reflux disease)   . Glaucoma    narrow angle,  sees Dr. Herbert Deaner   . Headache(784.0)   . Hearing loss    left ear  . Hemorrhoids   . HSV (herpes simplex virus) anogenital infection 05/2014  . HTN (hypertension)   . Hyperlipidemia   . Low back pain   . Obesity   . Peripheral vascular disease ([place])   . Stroke [facility])    TIA  "many yrs ago"    . TIA (transient ischemic attack)    also Hx of it.   . Type II or unspecified type diabetes mellitus without mention of complication, not stated as uncontrolled    sees Dr. Carrolyn Meiers     Surgeries: Procedure(s): Removal of hardware rods and pedicle screws lumbar four, Extension of fusion L4-5 to L5-S1 with reinsertion of screws at L 4, left L5-S1 transforaminal lumbar interbody fusion, Exploration  left L5 nerve root, bilateral decompressive laminectomy L3-4 HARDWARE REMOVAL on 10/30/2016   Consultants:   Discharged Condition: Improved  Hospital Course: [name] is an 71 y.o. female who was admitted 10/30/2016 for operative treatment of Spondylolisthesis, lumbar region. Patient failed conservative treatments (please see the history and physical for the specifics) and had severe unremitting pain that affects sleep, daily activities and work/hobbies. After pre-op clearance, the patient was taken to the operating room on 10/30/2016 and underwent  Procedure(s): Removal of hardware rods and pedicle screws lumbar four, Extension of fusion L4-5 to  L5-S1 with reinsertion of screws at L 4, left L5-S1 transforaminal lumbar interbody fusion, Exploration  left L5 nerve root, bilateral decompressive laminectomy L3-4 HARDWARE REMOVAL.    Patient was given perioperative antibiotics: Anti-infectives    Start     Dose/Rate Route Frequency Ordered Stop   11/03/16 0000  levofloxacin (LEVAQUIN) 500 MG tablet  Status:  Discontinued     500 mg Oral Every 24 hours 11/03/16 1022 11/05/16    11/01/16 1500  levofloxacin (LEVAQUIN) tablet 500 mg  Status:  Discontinued     500 mg Oral Every 24 hours 11/01/16 1343  11/04/16 1812   10/30/16 2200  valACYclovir (VALTREX) tablet 500 mg  Status:  Discontinued    Comments:  At onset of symptoms for 5 days     500 mg Oral 2 times daily 10/30/16 1632 10/30/16 1709   10/30/16 2100  ceFAZolin (ANCEF) IVPB 2g/100 mL premix     2 g 200 mL/hr over 30 Minutes Intravenous Every 8 hours 10/30/16 1632 10/31/16 0530   10/30/16 0630  ceFAZolin (ANCEF) IVPB 2g/100 mL premix     2 g 200 mL/hr over 30 Minutes Intravenous On call to O.R. 10/30/16 0630 10/30/16 1253       Patient was given sequential compression devices and early ambulation to prevent DVT.   Patient benefited maximally from hospital stay and there were no complications. At the time of discharge, the patient was urinating/moving their bowels without difficulty, tolerating a regular diet, pain is controlled with oral pain medications and they have been cleared by PT/OT.   Recent vital signs: No data found.    Recent laboratory studies:  Recent Labs  11/19/16 1545  WBC 8.2  HGB 10.9*  HCT 34.7*  PLT 317     Discharge Medications:   Allergies as of 11/04/2016      Reactions   No Known Allergies       Medication List    TAKE these medications   aspirin 81 MG tablet Take 81 mg by mouth daily.   diclofenac sodium 1 % Gel Commonly known as:  VOLTAREN Apply 2 g topically 4 (four) times daily as needed (for pain).   docusate sodium 100 MG capsule Commonly known as:  COLACE Take 1 capsule (100 mg total) by mouth 2 (two) times daily.   doxycycline 100 MG capsule Commonly known as:  VIBRAMYCIN Take 1 capsule (100 mg total) by mouth 2 (two) times daily. What changed:  when to take this   ergocalciferol 50000 units capsule Commonly known as:  VITAMIN D2 Take 50,000 Units by mouth every Saturday. On Saturday.   ferrous gluconate 324 MG tablet Commonly known as:  FERGON Take 1 tablet (324 mg total) by mouth 3 (three) times daily with meals.   furosemide 40 MG tablet Commonly known as:   LASIX TAKE ONE TABLET BY MOUTH ONCE DAILY   gabapentin 100 MG capsule Commonly known as:  NEURONTIN Take one tablet at night for 7 days then take one tablet BID What changed:  how much to take  how to take this  when to take this  reasons to take this  additional instructions   hydrALAZINE 100 MG tablet Commonly known as:  APRESOLINE TAKE ONE TABLET BY MOUTH THREE TIMES DAILY   insulin aspart cartridge Commonly known as:  NOVOLOG Inject into the skin 3 (three) times daily with meals.   insulin aspart 100 UNIT/ML injection Commonly known as:  novoLOG Inject into the skin 3 (three) times  daily with meals. V-Go kit pump   irbesartan 300 MG tablet Commonly known as:  AVAPRO Take 1 tablet (300 mg total) by mouth daily.   isosorbide mononitrate 60 MG 24 hr tablet Commonly known as:  IMDUR TAKE ONE TABLET BY MOUTH ONCE DAILY   latanoprost 0.005 % ophthalmic solution Commonly known as:  XALATAN Place 1 drop into both eyes at bedtime.   methyldopa 250 MG tablet Commonly known as:  ALDOMET Take 1 tablet (250 mg total) by mouth 2 (two) times daily.   metoprolol succinate 100 MG 24 hr tablet Commonly known as:  TOPROL-XL TAKE ONE TABLET BY MOUTH ONCE DAILY WITH OR IMMEDIATELY FOLLOWING A MEAL   montelukast 10 MG tablet Commonly known as:  SINGULAIR Take 10 mg by mouth daily.   naproxen sodium 220 MG tablet Commonly known as:  ANAPROX Take 440 mg by mouth 2 (two) times daily as needed (for pain.).   nitroGLYCERIN 0.4 MG SL tablet Commonly known as:  NITROSTAT Place 1 tablet (0.4 mg total) under the tongue every 5 (five) minutes as needed for chest pain.   pantoprazole 40 MG tablet Commonly known as:  PROTONIX Take 40 mg by mouth daily.   rosuvastatin 40 MG tablet Commonly known as:  CRESTOR Take 1 tablet (40 mg total) by mouth daily.   spironolactone 25 MG tablet Commonly known as:  ALDACTONE TAKE ONE TABLET BY MOUTH ONCE DAILY   TOUJEO SOLOSTAR 300  UNIT/ML Sopn Generic drug:  Insulin Glargine Inject 10 Units into the skin at bedtime.   valACYclovir 500 MG tablet Commonly known as:  VALTREX Take 1 tablet (500 mg total) by mouth 2 (two) times daily. At onset of symptoms for 5 days What changed:  when to take this  reasons to take this  additional instructions       Diagnostic Studies: Dg Chest 2 View  Result Date: 11/01/2016 CLINICAL DATA:  Pneumonia and weakness EXAM: CHEST  2 VIEW COMPARISON:  Yesterday FINDINGS: Limited low volume chest. More symmetric aeration today, but still streaky density seen at the left base. No edema, effusion, or pneumothorax. Mild cardiomegaly. IMPRESSION: Low volume chest. Atelectasis or infection at the left base is improved from yesterday. Electronically Signed   By: Monte Fantasia M.D.   On: 11/01/2016 08:00   Dg Chest 2 View  Result Date: 10/24/2016 CLINICAL DATA:  Preop for lumbar surgery EXAM: CHEST  2 VIEW COMPARISON:  11/21/2014 FINDINGS: Cardiomediastinal silhouette is stable. No infiltrate or pleural effusion. No pulmonary edema. Mild degenerative changes mid and lower thoracic spine. Partially visualized metallic fixation plate cervical spine. IMPRESSION: No active cardiopulmonary disease. Mild degenerative changes mid and lower thoracic spine. Electronically Signed   By: Lahoma Crocker M.D.   On: 10/24/2016 10:13   Dg Lumbar Spine Complete  Result Date: 10/30/2016 CLINICAL DATA:  Lumbar spine surgery. EXAM: LUMBAR SPINE - COMPLETE 4+ VIEW; DG C-ARM 61-120 MIN COMPARISON:  08/21/2016 04/12/2016. FINDINGS: Lumbar vertebra numbered with the lowest segmented appearing lumbar shaped vertebral bodies L5. Postsurgical changes lumbar spine with L4 through S1 posterior and interbody fusion. Hardware intact. Anatomic alignment. Four images obtained. 1 minutes 50 seconds fluoroscopy time utilized . IMPRESSION: Postsurgical changes lumbosacral spine. Electronically Signed   By: Marcello Moores  Register   On:  10/30/2016 14:35   Dg Chest Port 1 View  Result Date: 10/31/2016 CLINICAL DATA:  Cough.  Recent lumbar fusion 10/30/2016. EXAM: PORTABLE CHEST 1 VIEW COMPARISON:  10/24/2016. FINDINGS: Low lung volumes. New retrocardiac opacity  could represent developing infiltrate. Small LEFT effusion is also noted. RIGHT lung clear. Cardiomediastinal silhouette is increased due to low lung volumes. Prior cervical fusion. Cholecystectomy. IMPRESSION: Concern for early LEFT lower lobe pneumonia with effusion. Postoperative atelectasis not excluded. Electronically Signed   By: Staci Righter M.D.   On: 10/31/2016 12:10   Dg C-arm 61-120 Min  Result Date: 10/30/2016 CLINICAL DATA:  Lumbar spine surgery. EXAM: LUMBAR SPINE - COMPLETE 4+ VIEW; DG C-ARM 61-120 MIN COMPARISON:  08/21/2016 04/12/2016. FINDINGS: Lumbar vertebra numbered with the lowest segmented appearing lumbar shaped vertebral bodies L5. Postsurgical changes lumbar spine with L4 through S1 posterior and interbody fusion. Hardware intact. Anatomic alignment. Four images obtained. 1 minutes 50 seconds fluoroscopy time utilized . IMPRESSION: Postsurgical changes lumbosacral spine. Electronically Signed   By: Marcello Moores  Register   On: 10/30/2016 14:35   Xr Lumbar Spine 2-3 Views  Result Date: 11/12/2016 AP and lateral of the lumbar spine show the pedicle screws and rods at L4 to S1 three screws on the right L4,L5 and S1 and 2 screws on the left, markers for the cage at L5-S1 ingood position and alignment. No radiographic abnormalities of the fusion site. Severe DDD L3-4 with disc collapse, no anterolisthesis at this segment, s/p bilateral hemilamincectomies at this level.   Discharge Instructions    Call MD / Call 911    Complete by:  As directed    If you experience chest pain or shortness of breath, CALL 911 and be transported to the hospital emergency room.  If you develope a fever above 101 F, pus (white drainage) or increased drainage or redness at the  wound, or calf pain, call your surgeon's office.   Constipation Prevention    Complete by:  As directed    Drink plenty of fluids.  Prune juice may be helpful.  You may use a stool softener, such as Colace (over the counter) 100 mg twice a day.  Use MiraLax (over the counter) for constipation as needed.   Diet - low sodium heart healthy    Complete by:  As directed    Discharge instructions    Complete by:  As directed    Call if there is increasing drainage, fever greater than 101.5, severe head aches, and worsening nausea or light sensitivity. If shortness of breath, bloody cough or chest tightness or pain go to an emergency room. No lifting greater than 10 lbs. Avoid bending, stooping and twisting. Use brace when sitting and out of bed even to go to bathroom. Walk in house for first 2 weeks then may start to get out slowly increasing distances up to one quarter mile by 4-6 weeks post op. After 5 days may shower and change dressing following bathing with shower.When bathing remove the brace shower and replace brace before getting out of the shower. If drainage, keep dry dressing and do not bathe the incision, use an moisture impervious dressing. Please call and return for scheduled follow up appointment 2 weeks from the time of surgery.   Driving restrictions    Complete by:  As directed    No driving for 6 weeks   Increase activity slowly as tolerated    Complete by:  As directed    Lifting restrictions    Complete by:  As directed    No lifting for 10 weeks      Follow-up Information    Jessy Oto, MD Follow up in 1 week(s).   Specialty:  Orthopedic Surgery Why:  For wound re-check Contact information: [name] [address] [phone]        Laurey Morale, MD. Schedule an appointment as soon as possible for a visit in 1 week(s).   Specialty:  Family Medicine Contact information: [place]  [address] [phone]           Discharge Plan:  discharge to home Disposition:     Signed: Benjiman Core  11/20/2016, 2:57 PM

## 2016-11-22 DIAGNOSIS — F329 Major depressive disorder, single episode, unspecified: Secondary | ICD-10-CM | POA: Diagnosis not present

## 2016-11-22 DIAGNOSIS — M797 Fibromyalgia: Secondary | ICD-10-CM | POA: Diagnosis not present

## 2016-11-22 DIAGNOSIS — Z4789 Encounter for other orthopedic aftercare: Secondary | ICD-10-CM | POA: Diagnosis not present

## 2016-11-22 DIAGNOSIS — Z981 Arthrodesis status: Secondary | ICD-10-CM | POA: Diagnosis not present

## 2016-11-22 DIAGNOSIS — M1990 Unspecified osteoarthritis, unspecified site: Secondary | ICD-10-CM | POA: Diagnosis not present

## 2016-11-22 DIAGNOSIS — E1151 Type 2 diabetes mellitus with diabetic peripheral angiopathy without gangrene: Secondary | ICD-10-CM | POA: Diagnosis not present

## 2016-11-23 NOTE — Progress Notes (Signed)
I called and lmom advising her to continue meds and if she has questions she can call us back

## 2016-11-26 DIAGNOSIS — H9042 Sensorineural hearing loss, unilateral, left ear, with unrestricted hearing on the contralateral side: Secondary | ICD-10-CM | POA: Diagnosis not present

## 2016-11-26 DIAGNOSIS — H6121 Impacted cerumen, right ear: Secondary | ICD-10-CM | POA: Diagnosis not present

## 2016-11-27 DIAGNOSIS — Z4789 Encounter for other orthopedic aftercare: Secondary | ICD-10-CM | POA: Diagnosis not present

## 2016-11-27 DIAGNOSIS — Z981 Arthrodesis status: Secondary | ICD-10-CM | POA: Diagnosis not present

## 2016-11-27 DIAGNOSIS — M797 Fibromyalgia: Secondary | ICD-10-CM | POA: Diagnosis not present

## 2016-11-27 DIAGNOSIS — F329 Major depressive disorder, single episode, unspecified: Secondary | ICD-10-CM | POA: Diagnosis not present

## 2016-11-27 DIAGNOSIS — M1990 Unspecified osteoarthritis, unspecified site: Secondary | ICD-10-CM | POA: Diagnosis not present

## 2016-11-27 DIAGNOSIS — E1151 Type 2 diabetes mellitus with diabetic peripheral angiopathy without gangrene: Secondary | ICD-10-CM | POA: Diagnosis not present

## 2016-11-29 ENCOUNTER — Telehealth (INDEPENDENT_AMBULATORY_CARE_PROVIDER_SITE_OTHER): Payer: Self-pay | Admitting: Radiology

## 2016-11-29 NOTE — Telephone Encounter (Signed)
[facility]PT called, and patient has had to cancel today's PT visit, her mother has passed away and she needs to plan the funeral, etc.  She has also not progressed as well as hoped so they are asking for extension on HHPT orders 2 x week x 1 week, I advised ok to do.

## 2016-12-05 DIAGNOSIS — Z981 Arthrodesis status: Secondary | ICD-10-CM | POA: Diagnosis not present

## 2016-12-05 DIAGNOSIS — Z4789 Encounter for other orthopedic aftercare: Secondary | ICD-10-CM | POA: Diagnosis not present

## 2016-12-05 DIAGNOSIS — E1151 Type 2 diabetes mellitus with diabetic peripheral angiopathy without gangrene: Secondary | ICD-10-CM | POA: Diagnosis not present

## 2016-12-05 DIAGNOSIS — F329 Major depressive disorder, single episode, unspecified: Secondary | ICD-10-CM | POA: Diagnosis not present

## 2016-12-05 DIAGNOSIS — M1990 Unspecified osteoarthritis, unspecified site: Secondary | ICD-10-CM | POA: Diagnosis not present

## 2016-12-05 DIAGNOSIS — M797 Fibromyalgia: Secondary | ICD-10-CM | POA: Diagnosis not present

## 2016-12-06 ENCOUNTER — Encounter (INDEPENDENT_AMBULATORY_CARE_PROVIDER_SITE_OTHER): Payer: Self-pay | Admitting: Specialist

## 2016-12-06 ENCOUNTER — Ambulatory Visit (INDEPENDENT_AMBULATORY_CARE_PROVIDER_SITE_OTHER): Payer: Medicare Other

## 2016-12-06 ENCOUNTER — Ambulatory Visit (INDEPENDENT_AMBULATORY_CARE_PROVIDER_SITE_OTHER): Payer: Medicare Other | Admitting: Specialist

## 2016-12-06 VITALS — BP 148/71 | HR 84 | Ht 64.0 in | Wt 219.0 lb

## 2016-12-06 DIAGNOSIS — M4326 Fusion of spine, lumbar region: Secondary | ICD-10-CM

## 2016-12-06 NOTE — Patient Instructions (Signed)
     If shortness of breath, bloody cough or chest tightness or pain go to an emergency room. No lifting greater than 10 lbs. Avoid bending, stooping and twisting. Use brace when sitting and out of bed even to go to bathroom. Slowly increasing distances up to one mile by 4-6 weeks post op. May shower and change dressing following bathing with shower.When bathing remove the brace shower and replace brace before getting out of the shower.

## 2016-12-06 NOTE — Progress Notes (Signed)
Post-Op Visit Note   Patient: [name]           Date of Birth: 10/08/1945           MRN: [id number] Visit Date: 12/06/2016 PCP: Laurey Morale, MD   Assessment & Plan: 5 weeks post extension of fusion from L4-5 to L5-S1 with decompresson at L3-4  Chief Complaint:  Chief Complaint  Patient presents with  . Lower Back - Routine Post Op   Visit Diagnoses:  1. Fusion of lumbar spine     Plan:If shortness of breath, bloody cough or chest tightness or pain go to an emergency room. No lifting greater than 10 lbs. Avoid bending, stooping and twisting. Use brace when sitting and out of bed even to go to bathroom. Slowly increasing distances up to one mile by 4-6 weeks post op. May shower and change dressing following bathing with shower.When bathing remove the brace shower and replace brace before getting out of the shower.  Follow-Up Instructions: No Follow-up on file.   Orders:  Orders Placed This Encounter  Procedures  . XR Lumbar Spine 2-3 Views   No orders of the defined types were placed in this encounter.   Imaging: No results found.  PMFS History: Patient Active Problem List   Diagnosis Date Noted  . Anemia due to blood loss 11/02/2016    Priority: High    Class: Acute  . Spondylolisthesis, lumbar region 10/30/2016    Priority: High    Class: Chronic  . Spinal stenosis of lumbar region 10/30/2016    Priority: High    Class: Chronic  . Retained orthopedic hardware 10/30/2016    Priority: High    Class: Chronic  . History of lumbar spinal fusion   . History of fusion of lumbar spine   . Pneumonia   . Lethargy 10/31/2016  . AKI (acute kidney injury) ([place]) 10/31/2016  . Chronic diastolic CHF (congestive heart failure) ([place]) 10/31/2016  . Cough   . Leukocytosis   . Spinal stenosis of lumbar region with neurogenic claudication 10/30/2016  . Genetic testing 09/14/2015  . Atypical ductal hyperplasia of left breast 08/24/2015  . Family history of  breast cancer   . Family history of prostate cancer   . Family history of stomach cancer   . Abnormal nuclear stress test 12/29/2014  . Cervical spondylosis with myelopathy and radiculopathy 06/11/2014  . Carpal tunnel syndrome 05/14/2014  . Aftercare following surgery of the circulatory system, [place] 03/11/2014  . Pain of right lower extremity 03/11/2014  . Atypical lobular hyperplasia of left breast 02/12/2014  . Fibromyalgia 09/21/2013  . Occlusion and stenosis of carotid artery without mention of cerebral infarction 02/29/2012  . Back abscess 01/22/2012  . Chest pain with moderate risk of acute coronary syndrome 12/09/2009  . Shortness of breath 06/10/2009  . Orthostatic hypotension 01/31/2009  . CAD S/P RCA DES June 2010 12/23/2008  . Normocytic anemia 07/21/2008  . Headache(784.0) 07/21/2008  . History of TIA (transient ischemic attack) 07/21/2008  . Acute bronchitis 04/20/2008  . Edema 02/27/2008  . Diabetes mellitus with complication ([place]) 92/42/6834  . Depression 07/16/2007  . Dyslipidemia 02/21/2007  . Essential hypertension 02/21/2007  . ASTHMA 02/21/2007  . GERD 02/21/2007  . LOW BACK PAIN 02/21/2007   Past Medical History:  Diagnosis Date  . Abnormal nuclear stress test 12/29/2014  . Allergy   . Anemia, unspecified   . Arthritis   . CAD (coronary artery disease)    sees. Dr.  Cooper. s/p cath with PCI of RCA (xience des) June 2010. Pt also with LAD and D2 dzs-NL FFR in both areas med Rx  . Carotid artery occlusion   . Cataract    sees Dr. Herbert Deaner   . Depressive disorder, not elsewhere classified   . Duodenal nodule   . Edema   . Fibromyalgia   . Gastric polyps    COLON  . GERD (gastroesophageal reflux disease)   . Glaucoma    narrow angle, sees Dr. Herbert Deaner   . Headache(784.0)   . Hearing loss    left ear  . Hemorrhoids   . HSV (herpes simplex virus) anogenital infection 05/2014  . HTN (hypertension)   . Hyperlipidemia   . Low back pain   . Obesity   .  Peripheral vascular disease ([place])   . Stroke [facility])    TIA  "many yrs ago"    . TIA (transient ischemic attack)    also Hx of it.   . Type II or unspecified type diabetes mellitus without mention of complication, not stated as uncontrolled    sees Dr. Carrolyn Meiers     Family History  Problem Relation Age of Onset  . Hypertension Mother   . Hypertension Sister   . Hyperlipidemia Sister   . Hypertension Brother   . Cancer Brother        PROSTATE/LUNG  . Diabetes Brother   . Heart disease Brother        Before age 4 - Bypass  . Varicose Veins Brother   . Cancer Father        Prostate and pancreatic   . Stomach cancer Maternal Grandmother 48  . Heart disease Maternal Grandfather   . Breast cancer Maternal Aunt 57  . Cancer Maternal Aunt        Stomach  . Breast cancer Cousin 51       maternal first cousin  . Prostate cancer Cousin 64  . Breast cancer Cousin 9  . Breast cancer Cousin 40       maternal first cousin's daughter  . Coronary artery disease Neg Hx        premature CAD  . Colon cancer Neg Hx   . Esophageal cancer Neg Hx     Past Surgical History:  Procedure Laterality Date  . ABDOMINAL HYSTERECTOMY  age 8   TAH and USO  Leiomyomata  . ANTERIOR CERVICAL CORPECTOMY N/A 06/11/2014   Procedure: ANTERIOR CERVICAL CORPECTOMY CERVICAL SIX; WITH CERVICAL FIVE TO CERVICAL SEVEN ARTHRODESIS;  Surgeon: Ashok Pall, MD;  Location: [place] [facility]UR[facility] ORS;  Service: Neurosurgery;  Laterality: N/A;  . BACK SURGERY  2008   lumb fusion  . BREAST BIOPSY Left 01/26/2014   Procedure: EXCISION LEFT BREAST MASS;  Surgeon: Adin Hector, MD;  Location: [facility];  Service: General;  Laterality: Left;  . CARDIAC CATHETERIZATION    . CARDIAC CATHETERIZATION N/A 12/29/2014   Procedure: Left Heart Cath and Coronary Angiography;  Surgeon: Sherren Mocha, MD;  Location: [place] CV LAB;  Service: Cardiovascular;  Laterality: N/A;  . CAROTID ENDARTERECTOMY  march 2012   per Dr.  Morton Amy  . CARPAL TUNNEL RELEASE Left 05/14/2014   Procedure: Left Carpal tunnel release;  Surgeon: Ashok Pall, MD;  Location: [place] NEURO ORS;  Service: Neurosurgery;  Laterality: Left;  Left Carpal tunnel release  . CARPAL TUNNEL RELEASE Bilateral   . CHOLECYSTECTOMY    . COLONOSCOPY  05-29-11   per Dr. Henrene Pastor, benign  polyps, repeat in 5 yrs    . DILATION AND CURETTAGE OF UTERUS    . ESOPHAGOGASTRODUODENOSCOPY  02-08-06   per Dr. Henrene Pastor, gastritis   . EYE SURGERY Left June 2016   Cataract  . GLAUCOMA SURGERY     with laser, per Dr. Henrene Pastor   . HARDWARE REMOVAL  10/30/2016   Procedure: HARDWARE REMOVAL;  Surgeon: Jessy Oto, MD;  Location: [facility];  Service: Orthopedics;;  . LUMBAR FUSION  2008  . POSTERIOR LUMBAR FUSION  10/30/2016   Removal of hardware rods and pedicle screws lumbar four, Extension of fusion L4-5 to L5-S1 with reinsertion of screws at L 4, left L5-S1 transforaminal lumbar interbody fusion, Exploration  left L5 nerve root, bilateral decompressive laminectomy L3-4/notes 10/30/2016  . ULNAR NERVE TRANSPOSITION Left 05/14/2014   Procedure: Left Ulnar nerve decompression;  Surgeon: Ashok Pall, MD;  Location: [place] NEURO ORS;  Service: Neurosurgery;  Laterality: Left;  Left Ulnar nerve decompression   Social History   Occupational History  . Not on file.   Social History Main Topics  . Smoking status: Former Smoker    Quit date: 01/22/1967  . Smokeless tobacco: Never Used  . Alcohol use No  . Drug use: No  . Sexual activity: No     Comment: HYST-1st intercourse 26 yo-5 partners

## 2017-01-07 ENCOUNTER — Other Ambulatory Visit: Payer: Self-pay

## 2017-01-07 MED ORDER — ROSUVASTATIN CALCIUM 40 MG PO TABS: 40.0000 mg | ORAL_TABLET | Freq: Every day | ORAL | 3 refills | Status: DC

## 2017-01-10 ENCOUNTER — Ambulatory Visit (INDEPENDENT_AMBULATORY_CARE_PROVIDER_SITE_OTHER): Payer: Medicare Other | Admitting: Gynecology

## 2017-01-10 ENCOUNTER — Encounter: Payer: Self-pay | Admitting: Gynecology

## 2017-01-10 VITALS — BP 124/82 | Ht 63.0 in | Wt 190.0 lb

## 2017-01-10 DIAGNOSIS — Z01411 Encounter for gynecological examination (general) (routine) with abnormal findings: Secondary | ICD-10-CM | POA: Diagnosis not present

## 2017-01-10 DIAGNOSIS — N952 Postmenopausal atrophic vaginitis: Secondary | ICD-10-CM | POA: Diagnosis not present

## 2017-01-10 DIAGNOSIS — N764 Abscess of vulva: Secondary | ICD-10-CM

## 2017-01-10 NOTE — Patient Instructions (Signed)
Schedule your colonoscopy with either:  [facility]   Address: [address], [place], [place] [address]  [phone]

## 2017-01-10 NOTE — Progress Notes (Signed)
    [name] [birth year] [id number]        71 y.o.  G2P1011 for breast and pelvic exam. Patient also notes small painful bump on her right perineum. Has been present for some time but now has become more painful. No urinary symptoms such as frequency dysuria or urgency low back pain. No GI issues such as nausea vomiting diarrhea constipation. Does have history of left-sided vulvar boil and buttocks boil drained of the past year.  Past medical history,surgical history, problem list, medications, allergies, family history and social history were all reviewed and documented as reviewed in the EPIC chart.  ROS:  Performed with pertinent positives and negatives included in the history, assessment and plan.   Additional significant findings :  None   Exam: [name] assistant Vitals:   01/10/17 1023  BP: 124/82  Weight: 190 lb (86.2 kg)  Height: 5\' 3"  (1.6 m)   Body mass index is 33.66 kg/m.  General appearance:  Normal affect, orientation and appearance. Skin: Grossly normal HEENT: Without gross lesions.  No cervical or supraclavicular adenopathy. Thyroid normal.  Lungs:  Clear without wheezing, rales or rhonchi Cardiac: RR, without RMG Abdominal:  Soft, nontender, without masses, guarding, rebound, organomegaly or hernia Breasts:  Examined lying and sitting without masses, retractions, discharge or axillary adenopathy. Pelvic:  Ext, BUS, Vagina: With atrophic changes. Small pointing boil lower right labia majora, no surrounding skin signs of cellulitis. No inguinal adenopathy  Adnexa: Without masses or tenderness    Anus and perineum: Normal   Rectovaginal: Normal sphincter tone without palpated masses or tenderness.    Assessment/Plan:  71 y.o. G56P1011 female for breast and pelvic exam.   1. Right lower labia majora boil. Options for management reviewed with the patient to include Aline August now versus warm soaks to allow spontaneous drainage and healing. At this point it  appears to be almost pointing. I recommend she use warm soaks to the area and that this should spontaneously resolve. If it persists any length of time or becomes more significantly painful that she'll follow up for further evaluation. 2. History of genital HSV. Rare outbreaks. Has Valtrex prescription. Will call if needs more. 3. Colonoscopy 2012. Reports needing to repeat at 5 years but did not do so. Strongly recommend she call [facility] and schedule her colonoscopy. Patient agrees to do so. 4. DEXA 2013 normal. Plan repeat DEXA next year at age 68. 28. Mammography 03/2016. Continue with annual mammography when due. SBE monthly reviewed. Breast exam normal today. 6. Pap smear 2011. No Pap smear done today. No history of abnormal Pap smears. We both agree to stop screening per current screening guidelines based on age and hysterectomy history. 7. Health maintenance. No routine lab work done as patient reports this done elsewhere. Follow up 1 year, sooner as needed.  Additional time in excess of her breast and pelvic exam was spent in direct face to face counseling and coordination of care in regards to her vulvar boil and management options and recommendations.    [name], 11:07 AM 01/10/2017

## 2017-01-14 ENCOUNTER — Telehealth: Payer: Self-pay | Admitting: Pharmacist

## 2017-01-14 ENCOUNTER — Other Ambulatory Visit: Payer: Self-pay | Admitting: Pharmacist

## 2017-01-14 MED ORDER — METHYLDOPA 500 MG PO TABS: 250.0000 mg | ORAL_TABLET | Freq: Two times a day (BID) | ORAL | 3 refills | Status: DC

## 2017-01-14 NOTE — Telephone Encounter (Signed)
Received call from [facility] that their methyldopa 250mg  tabs are on backorder. They do have 500mg  tabs in stock - sent in new rx for pt to take 1/2 of the 500mg  tabs BID until they have the 250mg  back in stock again.

## 2017-01-24 ENCOUNTER — Ambulatory Visit (INDEPENDENT_AMBULATORY_CARE_PROVIDER_SITE_OTHER): Payer: Medicare Other | Admitting: Specialist

## 2017-01-24 ENCOUNTER — Telehealth (INDEPENDENT_AMBULATORY_CARE_PROVIDER_SITE_OTHER): Payer: Self-pay | Admitting: *Deleted

## 2017-01-24 ENCOUNTER — Ambulatory Visit (INDEPENDENT_AMBULATORY_CARE_PROVIDER_SITE_OTHER): Payer: Medicare Other

## 2017-01-24 ENCOUNTER — Encounter (INDEPENDENT_AMBULATORY_CARE_PROVIDER_SITE_OTHER): Payer: Self-pay | Admitting: Specialist

## 2017-01-24 VITALS — BP 140/60 | HR 60 | Ht 64.0 in | Wt 219.0 lb

## 2017-01-24 DIAGNOSIS — M7061 Trochanteric bursitis, right hip: Secondary | ICD-10-CM

## 2017-01-24 DIAGNOSIS — M4326 Fusion of spine, lumbar region: Secondary | ICD-10-CM

## 2017-01-24 NOTE — Telephone Encounter (Signed)
When pt was checking out she stated she needed to come back in a week to get her sugar checked. The ROV notes say 6 weeks, I put pt in for next avaliable but wasn't sure about the 1 week visit.

## 2017-01-24 NOTE — Progress Notes (Signed)
Post-Op Visit Note   Patient: [name]           Date of Birth: 1946/07/07           MRN: [id number] Visit Date: 01/24/2017 PCP: Laurey Morale, MD   Assessment & Plan:  Chief Complaint:  Chief Complaint  Patient presents with  . Lower Back - Routine Post Op  Patient returns. Status post L4-S1 fusion 10/30/2016. States that bilateral lower extremity radicular symptoms are improved from preop. She feels like she is making good progress. Continues to have some soreness in the right low back buttock but more so over the right lateral hip. Pain lateral hip when she is ambulating and also when she attempts to lay on her right side.     Visit Diagnoses:  1. Fusion of lumbar spine   2. Trochanteric bursitis, right hip     Plan: I was going to try conservative treatment for her right hip trochanteric bursitis with injection but fingerstick glucose today in clinic was 342 left finger and rechecked right finger 313.  Patient does have an insulin pump but advised her to contact her primary care physician to see if she can get in today for an appointment to see if the pump is actually working. Advised patient she also may need changes to her diabetes medication. Patient is also been under a significant amount of stress with her brother-in-law and mother recently passing away.  I'll have her follow up in 1 week for recheck and will see if she can get her blood sugars down and I will attempt trochanteric bursa injection at that time. Continue wearing lumbar brace until we see her back. All questions answered  Follow-Up Instructions: Return in about 6 weeks (around 03/07/2017).   Orders:  Orders Placed This Encounter  Procedures  . XR Lumbar Spine 2-3 Views   No orders of the defined types were placed in this encounter.   Imaging: No results found.  PMFS History: Patient Active Problem List   Diagnosis Date Noted  . History of lumbar spinal fusion   . History of fusion of lumbar  spine   . Anemia due to blood loss 11/02/2016    Class: Acute  . Pneumonia   . Lethargy 10/31/2016  . AKI (acute kidney injury) ([place]) 10/31/2016  . Chronic diastolic CHF (congestive heart failure) ([place]) 10/31/2016  . Cough   . Leukocytosis   . Spondylolisthesis, lumbar region 10/30/2016    Class: Chronic  . Spinal stenosis of lumbar region 10/30/2016    Class: Chronic  . Retained orthopedic hardware 10/30/2016    Class: Chronic  . Spinal stenosis of lumbar region with neurogenic claudication 10/30/2016  . Genetic testing 09/14/2015  . Atypical ductal hyperplasia of left breast 08/24/2015  . Family history of breast cancer   . Family history of prostate cancer   . Family history of stomach cancer   . Abnormal nuclear stress test 12/29/2014  . Cervical spondylosis with myelopathy and radiculopathy 06/11/2014  . Carpal tunnel syndrome 05/14/2014  . Aftercare following surgery of the circulatory system, [place] 03/11/2014  . Pain of right lower extremity 03/11/2014  . Atypical lobular hyperplasia of left breast 02/12/2014  . Fibromyalgia 09/21/2013  . Occlusion and stenosis of carotid artery without mention of cerebral infarction 02/29/2012  . Back abscess 01/22/2012  . Chest pain with moderate risk of acute coronary syndrome 12/09/2009  . Shortness of breath 06/10/2009  . Orthostatic hypotension 01/31/2009  . CAD S/P  RCA DES June 2010 12/23/2008  . Normocytic anemia 07/21/2008  . Headache(784.0) 07/21/2008  . History of TIA (transient ischemic attack) 07/21/2008  . Acute bronchitis 04/20/2008  . Edema 02/27/2008  . Diabetes mellitus with complication ([place]) 37/62/8315  . Depression 07/16/2007  . Dyslipidemia 02/21/2007  . Essential hypertension 02/21/2007  . ASTHMA 02/21/2007  . GERD 02/21/2007  . LOW BACK PAIN 02/21/2007   Past Medical History:  Diagnosis Date  . Abnormal nuclear stress test 12/29/2014  . Allergy   . Anemia, unspecified   . Arthritis   . CAD (coronary  artery disease)    sees. Dr. Burt Knack. s/p cath with PCI of RCA (xience des) June 2010. Pt also with LAD and D2 dzs-NL FFR in both areas med Rx  . Carotid artery occlusion   . Cataract    sees Dr. Herbert Deaner   . Depressive disorder, not elsewhere classified   . Duodenal nodule   . Edema   . Fibromyalgia   . Gastric polyps    COLON  . GERD (gastroesophageal reflux disease)   . Glaucoma    narrow angle, sees Dr. Herbert Deaner   . Headache(784.0)   . Hearing loss    left ear  . Hemorrhoids   . HSV (herpes simplex virus) anogenital infection 05/2014  . HTN (hypertension)   . Hyperlipidemia   . Low back pain   . Obesity   . Peripheral vascular disease ([place])   . Stroke [facility])    TIA  "many yrs ago"    . TIA (transient ischemic attack)    also Hx of it.   . Type II or unspecified type diabetes mellitus without mention of complication, not stated as uncontrolled    sees Dr. Carrolyn Meiers     Family History  Problem Relation Age of Onset  . Hypertension Mother   . Hypertension Sister   . Hyperlipidemia Sister   . Hypertension Brother   . Cancer Brother        PROSTATE/LUNG  . Diabetes Brother   . Heart disease Brother        Before age 31 - Bypass  . Varicose Veins Brother   . Cancer Father        Prostate and pancreatic   . Stomach cancer Maternal Grandmother 37  . Heart disease Maternal Grandfather   . Breast cancer Maternal Aunt 75  . Cancer Maternal Aunt        Stomach  . Breast cancer Cousin 22       maternal first cousin  . Prostate cancer Cousin 29  . Breast cancer Cousin 14  . Breast cancer Cousin 40       maternal first cousin's daughter  . Coronary artery disease Neg Hx        premature CAD  . Colon cancer Neg Hx   . Esophageal cancer Neg Hx     Past Surgical History:  Procedure Laterality Date  . ABDOMINAL HYSTERECTOMY  age 25   TAH and USO  Leiomyomata  . ANTERIOR CERVICAL CORPECTOMY N/A 06/11/2014   Procedure: ANTERIOR CERVICAL CORPECTOMY CERVICAL SIX; WITH  CERVICAL FIVE TO CERVICAL SEVEN ARTHRODESIS;  Surgeon: Ashok Pall, MD;  Location: [place] [facility];  Service: Neurosurgery;  Laterality: N/A;  . BACK SURGERY  2008   lumb fusion  . BREAST BIOPSY Left 01/26/2014   Procedure: EXCISION LEFT BREAST MASS;  Surgeon: Adin Hector, MD;  Location: [facility];  Service: General;  Laterality: Left;  . CARDIAC  CATHETERIZATION    . CARDIAC CATHETERIZATION N/A 12/29/2014   Procedure: Left Heart Cath and Coronary Angiography;  Surgeon: Sherren Mocha, MD;  Location: [facility] CV LAB;  Service: Cardiovascular;  Laterality: N/A;  . CAROTID ENDARTERECTOMY  march 2012   per Dr. Morton Amy  . CARPAL TUNNEL RELEASE Left 05/14/2014   Procedure: Left Carpal tunnel release;  Surgeon: Ashok Pall, MD;  Location: [place] [facility] [facility];  Service: Neurosurgery;  Laterality: Left;  Left Carpal tunnel release  . CARPAL TUNNEL RELEASE Bilateral   . CHOLECYSTECTOMY    . COLONOSCOPY  05-29-11   per Dr. Henrene Pastor, benign polyps, repeat in 5 yrs    . DILATION AND CURETTAGE OF UTERUS    . ESOPHAGOGASTRODUODENOSCOPY  02-08-06   per Dr. Henrene Pastor, gastritis   . EYE SURGERY Left June 2016   Cataract  . GLAUCOMA SURGERY     with laser, per Dr. Henrene Pastor   . HARDWARE REMOVAL  10/30/2016   Procedure: HARDWARE REMOVAL;  Surgeon: Jessy Oto, MD;  Location: [facility];  Service: Orthopedics;;  . LUMBAR FUSION  2008  . POSTERIOR LUMBAR FUSION  10/30/2016   Removal of hardware rods and pedicle screws lumbar four, Extension of fusion L4-5 to L5-S1 with reinsertion of screws at L 4, left L5-S1 transforaminal lumbar interbody fusion, Exploration  left L5 nerve root, bilateral decompressive laminectomy L3-4/notes 10/30/2016  . ULNAR NERVE TRANSPOSITION Left 05/14/2014   Procedure: Left Ulnar nerve decompression;  Surgeon: Ashok Pall, MD;  Location: [facility] NEUR[facility] ORS;  Service: Neurosurgery;  Laterality: Left;  Left Ulnar nerve decompression   Social History   Occupational History  . Not on  file.   Social History Main Topics  . Smoking status: Former Smoker    Quit date: 01/22/1967  . Smokeless tobacco: Never Used  . Alcohol use No  . Drug use: No  . Sexual activity: No     Comment: HYST-1st intercourse 76 yo-5 partners   Exam Very pleasant black female alert and oriented in no acute distress. Patient has a Trendelenburg gait with a single-prong cane. Exquisitely tender over the right hip greater trochanter bursa. Less tender over the left side. Negative straight leg raise. Neurologically intact.

## 2017-01-25 NOTE — Telephone Encounter (Signed)
Put on sched for 01/31/17, Lmom for pt to advise of this as she needs to keep a check on her blood sugars and keep them down.

## 2017-01-31 ENCOUNTER — Ambulatory Visit (INDEPENDENT_AMBULATORY_CARE_PROVIDER_SITE_OTHER): Payer: Medicare Other | Admitting: Specialist

## 2017-01-31 ENCOUNTER — Encounter (INDEPENDENT_AMBULATORY_CARE_PROVIDER_SITE_OTHER): Payer: Self-pay | Admitting: Specialist

## 2017-01-31 VITALS — BP 150/57 | HR 58 | Ht 64.0 in | Wt 219.0 lb

## 2017-01-31 DIAGNOSIS — M7061 Trochanteric bursitis, right hip: Secondary | ICD-10-CM

## 2017-01-31 DIAGNOSIS — Z981 Arthrodesis status: Secondary | ICD-10-CM

## 2017-01-31 NOTE — Progress Notes (Signed)
Office Visit Note   Patient: [name]           Date of Birth: 1945-12-04           MRN: [id number] Visit Date: 01/31/2017              Requested by: Laurey Morale, MD [place], [place] [address] PCP: Laurey Morale, MD   Assessment & Plan: Visit Diagnoses:  1. Status post lumbar spinal fusion   2. Trochanteric bursitis, right hip     Plan: Patient's blood sugar in office today is 213. Advised patient that I need this to be lower before I can consider doing right hip bursa injection. States that she will continue to work on this and return in 2 weeks. Lumbar spine x-rays flexion and extension views reviewed with Dr. Louanne Skye today and he agrees that the fusion looks good and hardware intact. Patient can wean out of her brace. We will discuss formal PT when she returns in 2 weeks. All questions answered.  Follow-Up Instructions: Return in about 2 weeks (around 02/14/2017) for With Jeneen Rinks for possible right hip injection.   Orders:  No orders of the defined types were placed in this encounter.  No orders of the defined types were placed in this encounter.     Procedures: No procedures performed   Clinical Data: No additional findings.   Subjective: Chief Complaint  Patient presents with  . Right Hip - Follow-up    Blood sugar is 213  . Lower Back - Follow-up    HPI Patient returns with right lateral hip pain. Patient was seen by me last week and advised her to keep her blood sugars under control for possible hip injection today. Fingerstick glucose today in the clinic 213. Patient states that she went to multiple parties over the last week. Also admits to drinking soda.   Review of Systems No current cardiac pulmonary GI GU issues.  Objective: Vital Signs: BP (!) 150/57 (BP Location: Left Arm, Patient Position: Sitting)   Pulse (!) 58   Ht 5\' 4"  (1.626 m)   Wt 219 lb (99.3 kg)   BMI 37.59 kg/m   Physical Exam Pleasant female alert and  oriented in no acute distress. Gait is antalgic with a single prong cane. Neurologically intact.  Ortho Exam  Specialty Comments:  No specialty comments available.  Imaging: No results found.   PMFS History: Patient Active Problem List   Diagnosis Date Noted  . History of lumbar spinal fusion   . History of fusion of lumbar spine   . Anemia due to blood loss 11/02/2016    Class: Acute  . Pneumonia   . Lethargy 10/31/2016  . AKI (acute kidney injury) ([place]) 10/31/2016  . Chronic diastolic CHF (congestive heart failure) ([place]) 10/31/2016  . Cough   . Leukocytosis   . Spondylolisthesis, lumbar region 10/30/2016    Class: Chronic  . Spinal stenosis of lumbar region 10/30/2016    Class: Chronic  . Retained orthopedic hardware 10/30/2016    Class: Chronic  . Spinal stenosis of lumbar region with neurogenic claudication 10/30/2016  . Genetic testing 09/14/2015  . Atypical ductal hyperplasia of left breast 08/24/2015  . Family history of breast cancer   . Family history of prostate cancer   . Family history of stomach cancer   . Abnormal nuclear stress test 12/29/2014  . Cervical spondylosis with myelopathy and radiculopathy 06/11/2014  . Carpal tunnel syndrome 05/14/2014  .  Aftercare following surgery of the circulatory system, [place] 03/11/2014  . Pain of right lower extremity 03/11/2014  . Atypical lobular hyperplasia of left breast 02/12/2014  . Fibromyalgia 09/21/2013  . Occlusion and stenosis of carotid artery without mention of cerebral infarction 02/29/2012  . Back abscess 01/22/2012  . Chest pain with moderate risk of acute coronary syndrome 12/09/2009  . Shortness of breath 06/10/2009  . Orthostatic hypotension 01/31/2009  . CAD S/P RCA DES June 2010 12/23/2008  . Normocytic anemia 07/21/2008  . Headache(784.0) 07/21/2008  . History of TIA (transient ischemic attack) 07/21/2008  . Acute bronchitis 04/20/2008  . Edema 02/27/2008  . Diabetes mellitus with complication  ([place]) 99/37/1696  . Depression 07/16/2007  . Dyslipidemia 02/21/2007  . Essential hypertension 02/21/2007  . ASTHMA 02/21/2007  . GERD 02/21/2007  . LOW BACK PAIN 02/21/2007   Past Medical History:  Diagnosis Date  . Abnormal nuclear stress test 12/29/2014  . Allergy   . Anemia, unspecified   . Arthritis   . CAD (coronary artery disease)    sees. Dr. Burt Knack. s/p cath with PCI of RCA (xience des) June 2010. Pt also with LAD and D2 dzs-NL FFR in both areas med Rx  . Carotid artery occlusion   . Cataract    sees Dr. Herbert Deaner   . Depressive disorder, not elsewhere classified   . Duodenal nodule   . Edema   . Fibromyalgia   . Gastric polyps    COLON  . GERD (gastroesophageal reflux disease)   . Glaucoma    narrow angle, sees Dr. Herbert Deaner   . Headache(784.0)   . Hearing loss    left ear  . Hemorrhoids   . HSV (herpes simplex virus) anogenital infection 05/2014  . HTN (hypertension)   . Hyperlipidemia   . Low back pain   . Obesity   . Peripheral vascular disease ([place])   . Stroke [facility])    TIA  "many yrs ago"    . TIA (transient ischemic attack)    also Hx of it.   . Type II or unspecified type diabetes mellitus without mention of complication, not stated as uncontrolled    sees Dr. Carrolyn Meiers     Family History  Problem Relation Age of Onset  . Hypertension Mother   . Hypertension Sister   . Hyperlipidemia Sister   . Hypertension Brother   . Cancer Brother        PROSTATE/LUNG  . Diabetes Brother   . Heart disease Brother        Before age 79 - Bypass  . Varicose Veins Brother   . Cancer Father        Prostate and pancreatic   . Stomach cancer Maternal Grandmother 23  . Heart disease Maternal Grandfather   . Breast cancer Maternal Aunt 6  . Cancer Maternal Aunt        Stomach  . Breast cancer Cousin 31       maternal first cousin  . Prostate cancer Cousin 36  . Breast cancer Cousin 30  . Breast cancer Cousin 40       maternal first cousin's daughter  .  Coronary artery disease Neg Hx        premature CAD  . Colon cancer Neg Hx   . Esophageal cancer Neg Hx     Past Surgical History:  Procedure Laterality Date  . ABDOMINAL HYSTERECTOMY  age 29   TAH and USO  Leiomyomata  . ANTERIOR CERVICAL CORPECTOMY N/A  06/11/2014   Procedure: ANTERIOR CERVICAL CORPECTOMY CERVICAL SIX; WITH CERVICAL FIVE TO CERVICAL SEVEN ARTHRODESIS;  Surgeon: Ashok Pall, MD;  Location: [place] NEUR[facility] OR[facility];  Service: Neurosurgery;  Laterality: N/A;  . BACK SURGERY  2008   lumb fusion  . BREAST BIOPSY Left 01/26/2014   Procedure: EXCISION LEFT BREAST MASS;  Surgeon: Adin Hector, MD;  Location: [place];  Service: General;  Laterality: Left;  . CARDIAC CATHETERIZATION    . CARDIAC CATHETERIZATION N/A 12/29/2014   Procedure: Left Heart Cath and Coronary Angiography;  Surgeon: Sherren Mocha, MD;  Location: [place] CV LAB;  Service: Cardiovascular;  Laterality: N/A;  . CAROTID ENDARTERECTOMY  march 2012   per Dr. Morton Amy  . CARPAL TUNNEL RELEASE Left 05/14/2014   Procedure: Left Carpal tunnel release;  Surgeon: Ashok Pall, MD;  Location: [place] [facility];  Service: Neurosurgery;  Laterality: Left;  Left Carpal tunnel release  . CARPAL TUNNEL RELEASE Bilateral   . CHOLECYSTECTOMY    . COLONOSCOPY  05-29-11   per Dr. Henrene Pastor, benign polyps, repeat in 5 yrs    . DILATION AND CURETTAGE OF UTERUS    . ESOPHAGOGASTRODUODENOSCOPY  02-08-06   per Dr. Henrene Pastor, gastritis   . EYE SURGERY Left June 2016   Cataract  . GLAUCOMA SURGERY     with laser, per Dr. Henrene Pastor   . HARDWARE REMOVAL  10/30/2016   Procedure: HARDWARE REMOVAL;  Surgeon: Jessy Oto, MD;  Location: [place] [facility];  Service: Orthopedics;;  . LUMBAR FUSION  2008  . POSTERIOR LUMBAR FUSION  10/30/2016   Removal of hardware rods and pedicle screws lumbar four, Extension of fusion L4-5 to L5-S1 with reinsertion of screws at L 4, left L5-S1 transforaminal lumbar interbody fusion, Exploration  left L5 nerve  root, bilateral decompressive laminectomy L3-4/notes 10/30/2016  . ULNAR NERVE TRANSPOSITION Left 05/14/2014   Procedure: Left Ulnar nerve decompression;  Surgeon: Ashok Pall, MD;  Location: [place] NEUR[facility];  Service: Neurosurgery;  Laterality: Left;  Left Ulnar nerve decompression   Social History   Occupational History  . Not on file.   Social History Main Topics  . Smoking status: Former Smoker    Quit date: 01/22/1967  . Smokeless tobacco: Never Used  . Alcohol use No  . Drug use: No  . Sexual activity: No     Comment: HYST-1st intercourse 81 yo-5 partners

## 2017-02-06 DIAGNOSIS — I639 Cerebral infarction, unspecified: Secondary | ICD-10-CM | POA: Diagnosis not present

## 2017-02-06 DIAGNOSIS — I2510 Atherosclerotic heart disease of native coronary artery without angina pectoris: Secondary | ICD-10-CM | POA: Diagnosis not present

## 2017-02-06 DIAGNOSIS — D649 Anemia, unspecified: Secondary | ICD-10-CM | POA: Diagnosis not present

## 2017-02-06 DIAGNOSIS — K635 Polyp of colon: Secondary | ICD-10-CM | POA: Diagnosis not present

## 2017-02-06 DIAGNOSIS — I10 Essential (primary) hypertension: Secondary | ICD-10-CM | POA: Diagnosis not present

## 2017-02-06 DIAGNOSIS — D860 Sarcoidosis of lung: Secondary | ICD-10-CM | POA: Diagnosis not present

## 2017-02-06 DIAGNOSIS — E784 Other hyperlipidemia: Secondary | ICD-10-CM | POA: Diagnosis not present

## 2017-02-06 DIAGNOSIS — E559 Vitamin D deficiency, unspecified: Secondary | ICD-10-CM | POA: Diagnosis not present

## 2017-02-06 DIAGNOSIS — E1159 Type 2 diabetes mellitus with other circulatory complications: Secondary | ICD-10-CM | POA: Diagnosis not present

## 2017-02-06 DIAGNOSIS — M48061 Spinal stenosis, lumbar region without neurogenic claudication: Secondary | ICD-10-CM | POA: Diagnosis not present

## 2017-02-06 DIAGNOSIS — N62 Hypertrophy of breast: Secondary | ICD-10-CM | POA: Diagnosis not present

## 2017-02-12 ENCOUNTER — Telehealth: Payer: Self-pay | Admitting: Hematology

## 2017-02-12 NOTE — Telephone Encounter (Signed)
Spoke with patient regarding her upcoming appointments later in August. Sending her a confirmation letter.

## 2017-02-14 ENCOUNTER — Ambulatory Visit (INDEPENDENT_AMBULATORY_CARE_PROVIDER_SITE_OTHER): Payer: Medicare Other | Admitting: Specialist

## 2017-02-19 ENCOUNTER — Other Ambulatory Visit: Payer: Medicare Other

## 2017-02-19 ENCOUNTER — Ambulatory Visit: Payer: Medicare Other | Admitting: Hematology

## 2017-02-20 ENCOUNTER — Encounter: Payer: Self-pay | Admitting: Gynecology

## 2017-02-20 ENCOUNTER — Ambulatory Visit (INDEPENDENT_AMBULATORY_CARE_PROVIDER_SITE_OTHER): Payer: Medicare Other | Admitting: Gynecology

## 2017-02-20 VITALS — BP 136/80

## 2017-02-20 DIAGNOSIS — L723 Sebaceous cyst: Secondary | ICD-10-CM

## 2017-02-20 DIAGNOSIS — N764 Abscess of vulva: Secondary | ICD-10-CM | POA: Diagnosis not present

## 2017-02-20 NOTE — Patient Instructions (Addendum)
Continue sitz baths twice daily.  Follow up if the areas continued to be painful or you have a recurrence.

## 2017-02-20 NOTE — Progress Notes (Signed)
    [name] 17-Feb-1946 [id number]        71 y.o.  G2P1011 presents with a one-week history of vulvar boil. Patient notes 2 areas on her right labia that are swollen and uncomfortable. She has been doing sitz baths twice daily but cannot seem to bring them to a head. She does have a history of boils in the past. No fever or chills. No significant groin pain.  Past medical history,surgical history, problem list, medications, allergies, family history and social history were all reviewed and documented in the EPIC chart.  Directed ROS with pertinent positives and negatives documented in the history of present illness/assessment and plan.  Exam assistant [name]  Vitals:   02/20/17 0953  BP: 136/80   General appearance:  Normal Abdomen soft nontender without masses guarding rebound Pelvic external BUS vagina with atrophic changes. 2 small pointing boil-like lesions noted inner and outer aspects of the right lower labia minora. Do not seem to communicate. No vaginal lesions or tenderness. No inguinal tenderness or adenopathy. Bimanual without masses or tenderness. Physical Exam  Genitourinary:        Procedure: The skin overlying the pointing areas were cleansed with Betadine and subsequently using an 18-gauge needle both areas were lanced. The outer area produced a small amount of pus. The inner area produced sebaceous material. Both areas were drained with simple pressure. Patient tolerated well. Postoperative instructions were given to continue sitz baths twice daily.   Assessment/Plan:  71 y.o. G2P101 with small boil lower right outer labia minora and a small sebaceous cyst on the inner aspect. Both areas drained with l[place]ing. Patient will follow up if these continue to be an issue. She will continue sitz baths this week twice weekly.    [name], 10:09 AM 02/20/2017

## 2017-02-22 ENCOUNTER — Ambulatory Visit (INDEPENDENT_AMBULATORY_CARE_PROVIDER_SITE_OTHER): Payer: Medicare Other | Admitting: Surgery

## 2017-02-22 NOTE — Progress Notes (Signed)
[facility] NOTE 03/01/2017   Patient Care Team: Laurey Morale, MD as PCP - Lourena Simmonds, MD as Consulting Physician (Endocrinology) Sherren Mocha, MD as Consulting Physician (Cardiology) Ashok Pall, MD as Consulting Physician (Neurosurgery) Earlie Server, MD as Consulting Physician (Orthopedic Surgery) Fanny Skates, MD (Surgeon) [place] (Gynecologist)  CHIEF COMPLAINTS:  Follow up Atypical Lobular Hyperplasia [facility]) of the left breast   HISTORY OF INITIAL PRESENTING ILLNESS:   [name] 71 y.o. female from [place], [name] is here because of recent diagnosis of Left breast [place] which is a precursor lesion and imparts a higher future risk for developing invasive and non-invasive breast cancers in future. She also have several comorbid conditions like Diabetes, HTN, Chronic back pain, Coronary artery disease (1 stent), TIA and fibromyalgia.  She had history of a right breast biopsy on July 27,2010 consistent with benign stromal calcifications and no malignancy. She noticed a lump in left breast this year in May 2015 as she keeps her extra money in bra and she noticed a change in her breast when she was removing money. She underwent a 3D diagnostic mammogram and an ultrasound on 11/18/2013 read as benign and there was no sonographic evidence of malignancy. Patient saw Dr Dalbert Batman in surgery after being referred by Dr Phineas Real for this palpable lump with negative imaging studies.  She then had a lumpectomy done on 01/26/2014 and the pathology from that showed [place] (Atypical Lobular Hyperplasia). There was no invasive cancer or cancer in situ and margins were negative.Pathology accession no is [id number]. She is now referred to high risk breast clinic to discuss chemoprevention strategies.she is up to date with her pap smear, colonoscopy and bone density.  In terms of breast cancer risk profile:  She menarched at early age of 49 and went to  menopause at age 89 (TAH due to fibroids). She had 2 pregnancies resulting in miscarriages, no biological children, 1 adopted boy.  She did not breast-fed.  She received birth control pills for couple of years and also had IUD placed.  She was never exposed to fertility medications but took HRT for few years.  She has + family history of Breast cancer. 2 maternal cousins developed breast cancer (age 66's and 80's) and her maternal aunt had breast cancer also. 1 brother with prostate cancer. 1 sister healthy as a horse. Father had prostate cancer and stomach cancer. Adopted boy is 28 years old. Mother alive at age 50 with early dementia.No ovarian cancer in family.  PREVIOUS THERAPY: She tried anastrozole in 11/2014 and Tamoxifen in 03/2015 for about one month each, and stopped due to side effects.   CURRENT THERAPY: observation   INTERIM HISTORY: She returns for follow up. Pt did have lower spinal fusion extension surgery performed in April. She was admitted for 5-6 days at that time and she reports that her back pain has been improved since, but is still currently a 7/10. She was ambulatory initially with a walker but now with a cane. She was in physical therapy but she has now finished this. Pt is now considering returning to water therapy to continue improving her pain. Additionally, she reports that in May that her mother and brother-in-law both passed away one week after another. Things since this have been harder on her emotionally and she has noted some depression today. She has had some bilateral ribcage pain with tenderness. She has been taking Advil for this at home with some relief. She denies  any other recent issues or complaints.   MEDICAL HISTORY:  Past Medical History:  Diagnosis Date  . Abnormal nuclear stress test 12/29/2014  . Allergy   . Anemia, unspecified   . Arthritis   . CAD (coronary artery disease)    sees. Dr. Burt Knack. s/p cath with PCI of RCA (xience des) June 2010. Pt  also with LAD and D2 dzs-NL FFR in both areas med Rx  . Carotid artery occlusion   . Cataract    sees Dr. Herbert Deaner   . Depressive disorder, not elsewhere classified   . Duodenal nodule   . Edema   . Fibromyalgia   . Gastric polyps    COLON  . GERD (gastroesophageal reflux disease)   . Glaucoma    narrow angle, sees Dr. Herbert Deaner   . Headache(784.0)   . Hearing loss    left ear  . Hemorrhoids   . HSV (herpes simplex virus) anogenital infection 05/2014  . HTN (hypertension)   . Hyperlipidemia   . Low back pain   . Obesity   . Peripheral vascular disease ([place])   . Stroke [name])    TIA  "many yrs ago"    . TIA (transient ischemic attack)    also Hx of it.   . Type II or unspecified type diabetes mellitus without mention of complication, not stated as uncontrolled    sees Dr. Carrolyn Meiers     SURGICAL HISTORY: Past Surgical History:  Procedure Laterality Date  . ABDOMINAL HYSTERECTOMY  age 42   TAH and USO  Leiomyomata  . ANTERIOR CERVICAL CORPECTOMY N/A 06/11/2014   Procedure: ANTERIOR CERVICAL CORPECTOMY CERVICAL SIX; WITH CERVICAL FIVE TO CERVICAL SEVEN ARTHRODESIS;  Surgeon: Ashok Pall, MD;  Location: [place] [facility]UR[facility] ORS;  Service: Neurosurgery;  Laterality: N/A;  . BACK SURGERY  2008   lumb fusion  . BREAST BIOPSY Left 01/26/2014   Procedure: EXCISION LEFT BREAST MASS;  Surgeon: Adin Hector, MD;  Location: [place];  Service: General;  Laterality: Left;  . CARDIAC CATHETERIZATION    . CARDIAC CATHETERIZATION N/A 12/29/2014   Procedure: Left Heart Cath and Coronary Angiography;  Surgeon: Sherren Mocha, MD;  Location: [place] CV LAB;  Service: Cardiovascular;  Laterality: N/A;  . CAROTID ENDARTERECTOMY  march 2012   per Dr. Morton Amy  . CARPAL TUNNEL RELEASE Left 05/14/2014   Procedure: Left Carpal tunnel release;  Surgeon: Ashok Pall, MD;  Location: [place] [facility]UR[facility]S;  Service: Neurosurgery;  Laterality: Left;  Left Carpal tunnel release  . CARPAL  TUNNEL RELEASE Bilateral   . CHOLECYSTECTOMY    . COLONOSCOPY  05-29-11   per Dr. Henrene Pastor, benign polyps, repeat in 5 yrs    . DILATION AND CURETTAGE OF UTERUS    . ESOPHAGOGASTRODUODENOSCOPY  02-08-06   per Dr. Henrene Pastor, gastritis   . EYE SURGERY Left June 2016   Cataract  . GLAUCOMA SURGERY     with laser, per Dr. Henrene Pastor   . HARDWARE REMOVAL  10/30/2016   Procedure: HARDWARE REMOVAL;  Surgeon: Jessy Oto, MD;  Location: [place];  Service: Orthopedics;;  . LUMBAR FUSION  2008  . POSTERIOR LUMBAR FUSION  10/30/2016   Removal of hardware rods and pedicle screws lumbar four, Extension of fusion L4-5 to L5-S1 with reinsertion of screws at L 4, left L5-S1 transforaminal lumbar interbody fusion, Exploration  left L5 nerve root, bilateral decompressive laminectomy L3-4/notes 10/30/2016  . ULNAR NERVE TRANSPOSITION Left 05/14/2014   Procedure: Left Ulnar nerve  decompression;  Surgeon: Ashok Pall, MD;  Location: [place] NEUR[facility] ORS;  Service: Neurosurgery;  Laterality: Left;  Left Ulnar nerve decompression    SOCIAL HISTORY: Social History   Social History  . Marital status: Married    Spouse name: N/A  . Number of children: 1  . Years of education: N/A   Occupational History  . Not on file.   Social History Main Topics  . Smoking status: Former Smoker    Quit date: 01/22/1967  . Smokeless tobacco: Never Used  . Alcohol use No  . Drug use: No  . Sexual activity: No     Comment: HYST-1st intercourse 50 yo-5 partners   Other Topics Concern  . Not on file   Social History Narrative  . No narrative on file    FAMILY HISTORY: Family History  Problem Relation Age of Onset  . Hypertension Mother   . Hypertension Sister   . Hyperlipidemia Sister   . Hypertension Brother   . Cancer Brother        PROSTATE/LUNG  . Diabetes Brother   . Heart disease Brother        Before age 56 - Bypass  . Varicose Veins Brother   . Cancer Father        Prostate and pancreatic   . Stomach cancer Maternal  Grandmother 76  . Heart disease Maternal Grandfather   . Breast cancer Maternal Aunt 53  . Cancer Maternal Aunt        Stomach  . Breast cancer Cousin 94       maternal first cousin  . Prostate cancer Cousin 82  . Breast cancer Cousin 5  . Breast cancer Cousin 40       maternal first cousin's daughter  . Coronary artery disease Neg Hx        premature CAD  . Colon cancer Neg Hx   . Esophageal cancer Neg Hx     ALLERGIES:  is allergic to no known allergies.  MEDICATIONS:  Current Outpatient Prescriptions  Medication Sig Dispense Refill  . albuterol (PROVENTIL HFA;VENTOLIN HFA) 108 (90 Base) MCG/ACT inhaler Inhale 2 puffs into the lungs every 4 (four) hours as needed for wheezing or shortness of breath. 1 Inhaler 2  . aspirin 81 MG tablet Take 81 mg by mouth daily.     . diclofenac sodium (VOLTAREN) 1 % GEL Apply 2 g topically 4 (four) times daily as needed (for pain).     Marland Kitchen ergocalciferol (VITAMIN D2) 50000 UNITS capsule Take 50,000 Units by mouth every Saturday. On Saturday.    . furosemide (LASIX) 40 MG tablet TAKE ONE TABLET BY MOUTH ONCE DAILY 30 tablet 0  . hydrALAZINE (APRESOLINE) 100 MG tablet TAKE ONE TABLET BY MOUTH THREE TIMES DAILY 90 tablet 11  . insulin aspart (NOVOLOG) 100 UNIT/ML injection Inject into the skin 3 (three) times daily with meals. V-Go kit pump    . insulin aspart (NOVOLOG) cartridge Inject into the skin 3 (three) times daily with meals.    . Insulin Glargine (TOUJEO SOLOSTAR) 300 UNIT/ML SOPN Inject 10 Units into the skin at bedtime.    . irbesartan (AVAPRO) 300 MG tablet Take 1 tablet (300 mg total) by mouth daily. 90 tablet 3  . isosorbide mononitrate (IMDUR) 60 MG 24 hr tablet TAKE ONE TABLET BY MOUTH ONCE DAILY 90 tablet 3  . latanoprost (XALATAN) 0.005 % ophthalmic solution Place 1 drop into both eyes at bedtime.     . methyldopa (ALDOMET) 500  MG tablet Take 0.5 tablets (250 mg total) by mouth 2 (two) times daily. 45 tablet 3  . metoprolol  succinate (TOPROL-XL) 100 MG 24 hr tablet TAKE ONE TABLET BY MOUTH ONCE DAILY WITH OR IMMEDIATELY FOLLOWING A MEAL 30 tablet 11  . montelukast (SINGULAIR) 10 MG tablet Take 10 mg by mouth daily.    . pantoprazole (PROTONIX) 40 MG tablet Take 40 mg by mouth daily.     . rosuvastatin (CRESTOR) 40 MG tablet Take 1 tablet (40 mg total) by mouth daily. 90 tablet 3  . spironolactone (ALDACTONE) 25 MG tablet TAKE ONE TABLET BY MOUTH ONCE DAILY 90 tablet 3  . nitroGLYCERIN (NITROSTAT) 0.4 MG SL tablet Place 1 tablet (0.4 mg total) under the tongue every 5 (five) minutes as needed for chest pain. (Patient not taking: Reported on 03/01/2017) 25 tablet 1   No current facility-administered medications for this visit.     Review of Systems  Constitutional: Negative for chills, fever and weight loss.  HENT: Negative for congestion.   Eyes: Negative for blurred vision, double vision and photophobia.  Respiratory: Negative for cough and wheezing.   Cardiovascular: Negative for chest pain and palpitations.  Gastrointestinal: Negative for abdominal pain, diarrhea, nausea and vomiting.  Genitourinary: Negative for dysuria and hematuria.  Musculoskeletal: Positive for back pain (chronic).  Skin: Negative for itching and rash.  Neurological: Negative for tingling and headaches.  Psychiatric/Behavioral: Positive for depression. Negative for suicidal ideas.  All other systems reviewed and are negative.   PHYSICAL EXAMINATION: ECOG PERFORMANCE STATUS: 1  Vitals:   03/01/17 0926  BP: (!) 167/56  Pulse: 65  Resp: 19  Temp: 98.4 F (36.9 C)  SpO2: 100%   Filed Weights   03/01/17 0926  Weight: 190 lb 12.8 oz (86.5 kg)    Physical Exam  Constitutional: She appears well-developed and well-nourished.  HENT:  Head: Normocephalic and atraumatic.  Nose: Nose normal.  Mouth/Throat: Oropharynx is clear and moist.  Eyes: Conjunctivae and EOM are normal. No scleral icterus.  Neck: Normal range of motion.  Neck supple. No JVD present. No tracheal deviation present. No thyromegaly present.  Cardiovascular: Normal rate, regular rhythm and normal heart sounds.  Exam reveals no gallop and no friction rub.   No murmur heard. Pulmonary/Chest: Effort normal and breath sounds normal. No respiratory distress. She has no wheezes. She has no rales. Right breast exhibits no inverted nipple, no mass, no nipple discharge, no skin change and no tenderness. Left breast exhibits no inverted nipple, no mass, no nipple discharge, no skin change and no tenderness. Breasts are symmetrical. There is no breast swelling.  Surgical site of the left upper breast. No palpable masses, redness, or nipple inversion.   C/o bilateral lower ribcage pain with some tenderness. No palpable masses.   Abdominal: Soft. Bowel sounds are normal. She exhibits no distension. There is no tenderness. There is no rebound. No hernia.  Genitourinary: No breast bleeding.  Musculoskeletal: Normal range of motion. She exhibits no edema or tenderness.  Lymphadenopathy:    She has no cervical adenopathy.  Neurological: She is alert. She has normal reflexes. She displays normal reflexes. No cranial nerve deficit. She exhibits normal muscle tone. Coordination normal.  Skin: Skin is warm and dry. No rash noted. No erythema. No pallor.  Psychiatric: She has a normal mood and affect. Her behavior is normal. Judgment and thought content normal.  Nursing note and vitals reviewed.  LABORATORY DATA:  CBC Latest Ref Rng & Units 11/19/2016  11/03/2016 11/02/2016  WBC 3.8 - 10.8 K/uL 8.2 14.7(H) 11.0(H)  Hemoglobin 11.7 - 15.5 g/dL 10.9(L) 11.0(L) 7.6(L)  Hematocrit 35.0 - 45.0 % 34.7(L) 32.7(L) 24.1(L)  Platelets 140 - 400 K/uL 317 165 102(L)    CMP Latest Ref Rng & Units 11/02/2016 11/01/2016 10/31/2016  Glucose 65 - 99 mg/dL 125(H) 162(H) 145(H)  BUN 6 - 20 mg/dL 23(H) 20 16  Creatinine 0.44 - 1.00 mg/dL 1.19(H) 1.26(H) 1.34(H)  Sodium 135 - 145 mmol/L 139  138 141  Potassium 3.5 - 5.1 mmol/L 3.4(L) 3.5 3.2(L)  Chloride 101 - 111 mmol/L 108 108 107  CO2 22 - 32 mmol/L 24 21(L) 26  Calcium 8.9 - 10.3 mg/dL 8.1(L) 7.8(L) 7.9(L)  Total Protein 6.5 - 8.1 g/dL - 5.0(L) -  Total Bilirubin 0.3 - 1.2 mg/dL - 0.7 -  Alkaline Phos 38 - 126 U/L - 54 -  AST 15 - 41 U/L - 31 -  ALT 14 - 54 U/L - 10(L) -    RADIOGRAPHIC STUDIES: I have personally reviewed the radiological images as listed and agreed with the findings in the report.  Mammogram 11/22/2014: (-)   MRI Brain w w/o contrast 04/07/15 IMPRESSION: 1.  No acute intracranial abnormality.  No brain mass identified. 2. Progression of chronic small vessel disease since 2009, moderate for age. Possible involvement of the right optic radiation.  ASSESSMENT:   71 y.o.  female with diagnosis of ALH (Atypical Lobular Hyperplasia) seen on a Lumpectomy of left breast. This was done for a palpable lump which was negative on mammogram and ultrasound. She had previously a right breast biopsy which was also negative for malignancy.   1. Left breast ALH  -Dr. Lona Kettle has previously extensively discussed the risks of developing breast cancer and chemoprevention with tamoxifen versus anastrozole. The overall benefit of chemoprevention is reduce her breast cancer by 50% with absolute benefit 3.5-4% risk reduction. She agreed to take chemoprevention  - she has tried anastrozole and tamoxifen but could not tolerate due to side effects. -we discussed  alternative chemopevention with  Raloxifene, and she decided not to try  -She has strong family history of breast cancer, but her genetic testing was negative. -continue annual screening mammogram, her last one in Sep 2017, which was normal. She is due, I'll set up for her annual mammogram at [facility] for next month. -Due to her dense breast tissue (category C), and high risk for breast cancer, I recommend her to have breast MRI every 1-2 years. I will set up for next  year. -I encouraged her to continue healthy diet and exercise, when she recovers well from her back surgery.   2. Iron deficient anemia -she has developed moderate microcytic anemia in the past 3-4 month, ferritin 11, low iron and sat, supports iron deficient anemia. Possible related to her prior surgery.    -continue ferrous sulfate 325 mg 2-3 times a day. -Her hemoglobin has normalized, serum iron and ferritin level also normalized, I encouraged her to continue oral iron pill. -She had her last colonoscopy in 2012, polyps was removed, I encouraged her to follow-up with her GI  3. HTN, DM, CAD, history of TIA -She will continue follow-up with her primary care physician  4. Depression  -Will consult with social worker for further management of this.   Plan -Consult social work -Agricultural engineer today -Mammogram at [facility] next month, yearly after that.  -breast MRI in [facility].  -F/u in 14mo  This document  serves as a record of services personally performed by Truitt Merle, MD. It was created on her behalf by Reola Mosher, a trained medical scribe. The creation of this record is based on the scribe's personal observations and the provider's statements to them. This document has been checked and approved by the attending provider.  Truitt Merle 03/01/2017

## 2017-03-01 ENCOUNTER — Other Ambulatory Visit: Payer: Medicare Other

## 2017-03-01 ENCOUNTER — Ambulatory Visit (HOSPITAL_BASED_OUTPATIENT_CLINIC_OR_DEPARTMENT_OTHER): Payer: Medicare Other | Admitting: Hematology

## 2017-03-01 ENCOUNTER — Encounter: Payer: Self-pay | Admitting: *Deleted

## 2017-03-01 ENCOUNTER — Telehealth: Payer: Self-pay

## 2017-03-01 ENCOUNTER — Encounter: Payer: Self-pay | Admitting: Hematology

## 2017-03-01 VITALS — BP 167/56 | HR 65 | Temp 98.4°F | Resp 19 | Ht 64.0 in | Wt 190.8 lb

## 2017-03-01 DIAGNOSIS — N6092 Unspecified benign mammary dysplasia of left breast: Secondary | ICD-10-CM

## 2017-03-01 DIAGNOSIS — I10 Essential (primary) hypertension: Secondary | ICD-10-CM

## 2017-03-01 DIAGNOSIS — D509 Iron deficiency anemia, unspecified: Secondary | ICD-10-CM | POA: Diagnosis not present

## 2017-03-01 DIAGNOSIS — E119 Type 2 diabetes mellitus without complications: Secondary | ICD-10-CM

## 2017-03-01 DIAGNOSIS — I2510 Atherosclerotic heart disease of native coronary artery without angina pectoris: Secondary | ICD-10-CM

## 2017-03-01 NOTE — Telephone Encounter (Signed)
Scheduled appointment for 02/2018 printed avs, and calender.

## 2017-03-01 NOTE — Progress Notes (Signed)
[name]  Clinical Social Work was referred by Medical sales representative for assessment of psychosocial needs, referral to counseling provider in community.  Clinical Social Worker contacted patient at home to offer support and assess for needs.  CSW introduced self, explained role of CSW/Pt and Family Support Team, support groups and other resources to assist. Pt is far out from her cancer diagnosis and shared her current concerns are related to grief and loss, relationship strains as well. She recently experienced three deaths of family members. CSW educated her about options through [facility] and several other counseling providers in local area. Pt encouraged to check with her insurance to look for in-network providers. Pt denied SI and appears to be experiencing normal grief and loss reaction. Pt encouraged to reach out as needed with further questions/needs.    Clinical Social Work interventions:  Supportive Psychiatric nurse education and referral  Karen Racer, LCSW, OSW-C Clinical Social Worker [place]  [place] Phone: [phone] Fax: [phone]

## 2017-03-15 ENCOUNTER — Ambulatory Visit (INDEPENDENT_AMBULATORY_CARE_PROVIDER_SITE_OTHER): Payer: Medicare Other | Admitting: Specialist

## 2017-03-21 ENCOUNTER — Encounter (INDEPENDENT_AMBULATORY_CARE_PROVIDER_SITE_OTHER): Payer: Self-pay | Admitting: Specialist

## 2017-03-21 ENCOUNTER — Ambulatory Visit (INDEPENDENT_AMBULATORY_CARE_PROVIDER_SITE_OTHER): Payer: Medicare Other | Admitting: Specialist

## 2017-03-21 ENCOUNTER — Ambulatory Visit (INDEPENDENT_AMBULATORY_CARE_PROVIDER_SITE_OTHER): Payer: Medicare Other

## 2017-03-21 VITALS — BP 139/59 | HR 67 | Ht 64.0 in | Wt 190.0 lb

## 2017-03-21 DIAGNOSIS — M533 Sacrococcygeal disorders, not elsewhere classified: Secondary | ICD-10-CM

## 2017-03-21 DIAGNOSIS — M5136 Other intervertebral disc degeneration, lumbar region: Secondary | ICD-10-CM | POA: Diagnosis not present

## 2017-03-21 DIAGNOSIS — Z981 Arthrodesis status: Secondary | ICD-10-CM | POA: Diagnosis not present

## 2017-03-21 DIAGNOSIS — Z4889 Encounter for other specified surgical aftercare: Secondary | ICD-10-CM

## 2017-03-21 NOTE — Progress Notes (Signed)
Office Visit Note   Patient: [name]           Date of Birth: 11-Sep-1945           MRN: [id number] Visit Date: 03/21/2017              Requested by: Laurey Morale, MD [place], [place] [address] PCP: Laurey Morale, MD   Assessment & Plan: Visit Diagnoses:  1. Status post lumbar spinal fusion   2. Sacroiliac joint pain   3. Degenerative disc disease, lumbar     Plan: Avoid frequent bending and stooping  No lifting greater than 10 lbs. May use ice or moist heat for pain. Weight loss is of benefit. Handicap license is approved.    Follow-Up Instructions: No Follow-up on file.   Orders:  Orders Placed This Encounter  Procedures  . XR Lumbar Spine 2-3 Views   No orders of the defined types were placed in this encounter.     Procedures: No procedures performed   Clinical Data: No additional findings.   Subjective: Chief Complaint  Patient presents with  . Lower Back - Follow-up    71 year old female with increasing pain into right buttock no numbness. The pain she was experiencing preoperatively was into the right anterior thigh. No bowel or bladder difficulty. No paresthesias. Prolong standing and walking and prolong bending or stooping increase the pain.  She is 5 months post op extension of an L4-5 fusion down to L5-S1 for spondylolisthesis and decompression of the L3-4 level for spinal stenosis.     Review of Systems  Constitutional: Negative.   HENT: Negative.   Eyes: Negative.   Respiratory: Negative.   Cardiovascular: Negative.   Gastrointestinal: Negative.   Endocrine: Negative.   Genitourinary: Negative.   Musculoskeletal: Negative.   Skin: Negative.   Allergic/Immunologic: Negative.   Neurological: Negative.   Hematological: Negative.   Psychiatric/Behavioral: Negative.      Objective: Vital Signs: BP (!) 139/59 (BP Location: Left Arm, Patient Position: Sitting)   Pulse 67   Ht 5\' 4"  (1.626 m)   Wt 190 lb  (86.2 kg)   BMI 32.61 kg/m   Physical Exam  Constitutional: She is oriented to person, place, and time. She appears well-developed and well-nourished.  HENT:  Head: Normocephalic and atraumatic.  Eyes: Pupils are equal, round, and reactive to light. EOM are normal.  Neck: Normal range of motion. Neck supple.  Pulmonary/Chest: Effort normal and breath sounds normal.  Abdominal: Soft. Bowel sounds are normal.  Neurological: She is alert and oriented to person, place, and time.  Skin: Skin is warm and dry.  Psychiatric: She has a normal mood and affect. Her behavior is normal. Judgment and thought content normal.    Back Exam   Tenderness  The patient is experiencing tenderness in the lumbar.  Range of Motion  Extension: 20  Flexion:  60 abnormal  Lateral Bend Right: abnormal  Lateral Bend Left: abnormal  Rotation Right: abnormal  Rotation Left: abnormal   Muscle Strength  Right Quadriceps:  5/5  Left Quadriceps:  5/5  Right Hamstrings:  5/5  Left Hamstrings:  5/5   Reflexes  Patellar: normal Achilles: normal Babinski's sign: normal   Other  Toe Walk: abnormal Heel Walk: abnormal Sensation: normal Gait: normal  Erythema: no back redness Scars: present  Comments:  Right thigh decrease circumference by 1/2 inch.      Specialty Comments:  No specialty comments available.  Imaging: No results found.   PMFS History: Patient Active Problem List   Diagnosis Date Noted  . Anemia due to blood loss 11/02/2016    Priority: High    Class: Acute  . Spondylolisthesis, lumbar region 10/30/2016    Priority: High    Class: Chronic  . Spinal stenosis of lumbar region 10/30/2016    Priority: High    Class: Chronic  . Retained orthopedic hardware 10/30/2016    Priority: High    Class: Chronic  . History of lumbar spinal fusion   . History of fusion of lumbar spine   . Pneumonia   . Lethargy 10/31/2016  . AKI (acute kidney injury) ([place]) 10/31/2016  . Chronic  diastolic CHF (congestive heart failure) ([place]) 10/31/2016  . Cough   . Leukocytosis   . Spinal stenosis of lumbar region with neurogenic claudication 10/30/2016  . Genetic testing 09/14/2015  . Atypical ductal hyperplasia of left breast 08/24/2015  . Family history of breast cancer   . Family history of prostate cancer   . Family history of stomach cancer   . Abnormal nuclear stress test 12/29/2014  . Cervical spondylosis with myelopathy and radiculopathy 06/11/2014  . Carpal tunnel syndrome 05/14/2014  . Aftercare following surgery of the circulatory system, [place] 03/11/2014  . Pain of right lower extremity 03/11/2014  . Atypical lobular hyperplasia of left breast 02/12/2014  . Fibromyalgia 09/21/2013  . Occlusion and stenosis of carotid artery without mention of cerebral infarction 02/29/2012  . Back abscess 01/22/2012  . Chest pain with moderate risk of acute coronary syndrome 12/09/2009  . Shortness of breath 06/10/2009  . Orthostatic hypotension 01/31/2009  . CAD S/P RCA DES June 2010 12/23/2008  . Normocytic anemia 07/21/2008  . Headache(784.0) 07/21/2008  . History of TIA (transient ischemic attack) 07/21/2008  . Acute bronchitis 04/20/2008  . Edema 02/27/2008  . Diabetes mellitus with complication ([place]) 84/13/2440  . Depression 07/16/2007  . Dyslipidemia 02/21/2007  . Essential hypertension 02/21/2007  . ASTHMA 02/21/2007  . GERD 02/21/2007  . LOW BACK PAIN 02/21/2007   Past Medical History:  Diagnosis Date  . Abnormal nuclear stress test 12/29/2014  . Allergy   . Anemia, unspecified   . Arthritis   . CAD (coronary artery disease)    sees. Dr. Burt Knack. s/p cath with PCI of RCA (xience des) June 2010. Pt also with LAD and D2 dzs-NL FFR in both areas med Rx  . Carotid artery occlusion   . Cataract    sees Dr. Herbert Deaner   . Depressive disorder, not elsewhere classified   . Duodenal nodule   . Edema   . Fibromyalgia   . Gastric polyps    COLON  . GERD  (gastroesophageal reflux disease)   . Glaucoma    narrow angle, sees Dr. Herbert Deaner   . Headache(784.0)   . Hearing loss    left ear  . Hemorrhoids   . HSV (herpes simplex virus) anogenital infection 05/2014  . HTN (hypertension)   . Hyperlipidemia   . Low back pain   . Obesity   . Peripheral vascular disease ([place])   . Stroke [facility])    TIA  "many yrs ago"    . TIA (transient ischemic attack)    also Hx of it.   . Type II or unspecified type diabetes mellitus without mention of complication, not stated as uncontrolled    sees Dr. Carrolyn Meiers     Family History  Problem Relation Age of Onset  . Hypertension  Mother   . Hypertension Sister   . Hyperlipidemia Sister   . Hypertension Brother   . Cancer Brother        PROSTATE/LUNG  . Diabetes Brother   . Heart disease Brother        Before age 53 - Bypass  . Varicose Veins Brother   . Cancer Father        Prostate and pancreatic   . Stomach cancer Maternal Grandmother 37  . Heart disease Maternal Grandfather   . Breast cancer Maternal Aunt 71  . Cancer Maternal Aunt        Stomach  . Breast cancer Cousin 13       maternal first cousin  . Prostate cancer Cousin 73  . Breast cancer Cousin 89  . Breast cancer Cousin 40       maternal first cousin's daughter  . Coronary artery disease Neg Hx        premature CAD  . Colon cancer Neg Hx   . Esophageal cancer Neg Hx     Past Surgical History:  Procedure Laterality Date  . ABDOMINAL HYSTERECTOMY  age 4   TAH and USO  Leiomyomata  . ANTERIOR CERVICAL CORPECTOMY N/A 06/11/2014   Procedure: ANTERIOR CERVICAL CORPECTOMY CERVICAL SIX; WITH CERVICAL FIVE TO CERVICAL SEVEN ARTHRODESIS;  Surgeon: Ashok Pall, MD;  Location: [place] NEURO ORS;  Service: Neurosurgery;  Laterality: N/A;  . BACK SURGERY  2008   lumb fusion  . BREAST BIOPSY Left 01/26/2014   Procedure: EXCISION LEFT BREAST MASS;  Surgeon: Adin Hector, MD;  Location: [place];  Service: General;   Laterality: Left;  . CARDIAC CATHETERIZATION    . CARDIAC CATHETERIZATION N/A 12/29/2014   Procedure: Left Heart Cath and Coronary Angiography;  Surgeon: Sherren Mocha, MD;  Location: [place] CV LAB;  Service: Cardiovascular;  Laterality: N/A;  . CAROTID ENDARTERECTOMY  march 2012   per Dr. Morton Amy  . CARPAL TUNNEL RELEASE Left 05/14/2014   Procedure: Left Carpal tunnel release;  Surgeon: Ashok Pall, MD;  Location: [place] NEURO ORS;  Service: Neurosurgery;  Laterality: Left;  Left Carpal tunnel release  . CARPAL TUNNEL RELEASE Bilateral   . CHOLECYSTECTOMY    . COLONOSCOPY  05-29-11   per Dr. Henrene Pastor, benign polyps, repeat in 5 yrs    . DILATION AND CURETTAGE OF UTERUS    . ESOPHAGOGASTRODUODENOSCOPY  02-08-06   per Dr. Henrene Pastor, gastritis   . EYE SURGERY Left June 2016   Cataract  . GLAUCOMA SURGERY     with laser, per Dr. Henrene Pastor   . HARDWARE REMOVAL  10/30/2016   Procedure: HARDWARE REMOVAL;  Surgeon: Jessy Oto, MD;  Location: [place];  Service: Orthopedics;;  . LUMBAR FUSION  2008  . POSTERIOR LUMBAR FUSION  10/30/2016   Removal of hardware rods and pedicle screws lumbar four, Extension of fusion L4-5 to L5-S1 with reinsertion of screws at L 4, left L5-S1 transforaminal lumbar interbody fusion, Exploration  left L5 nerve root, bilateral decompressive laminectomy L3-4/notes 10/30/2016  . ULNAR NERVE TRANSPOSITION Left 05/14/2014   Procedure: Left Ulnar nerve decompression;  Surgeon: Ashok Pall, MD;  Location: [place] NEURO ORS;  Service: Neurosurgery;  Laterality: Left;  Left Ulnar nerve decompression   Social History   Occupational History  . Not on file.   Social History Main Topics  . Smoking status: Former Smoker    Quit date: 01/22/1967  . Smokeless tobacco: Never Used  . Alcohol use No  .  Drug use: No  . Sexual activity: No     Comment: HYST-1st intercourse 79 yo-5 partners

## 2017-03-21 NOTE — Patient Instructions (Addendum)
Avoid frequent bending and stooping  No lifting greater than 10 lbs. May use ice or moist heat for pain. Weight loss is of benefit. Handicap license is approved. CT scan of the lumbar spine is ordered and you will be called to be scheduled for this.

## 2017-03-26 ENCOUNTER — Ambulatory Visit (INDEPENDENT_AMBULATORY_CARE_PROVIDER_SITE_OTHER): Payer: Medicare Other | Admitting: Family Medicine

## 2017-03-26 ENCOUNTER — Encounter: Payer: Self-pay | Admitting: Family Medicine

## 2017-03-26 VITALS — BP 122/60 | Temp 98.7°F | Ht 64.0 in | Wt 191.0 lb

## 2017-03-26 DIAGNOSIS — M48062 Spinal stenosis, lumbar region with neurogenic claudication: Secondary | ICD-10-CM | POA: Diagnosis not present

## 2017-03-26 DIAGNOSIS — E118 Type 2 diabetes mellitus with unspecified complications: Secondary | ICD-10-CM | POA: Diagnosis not present

## 2017-03-26 DIAGNOSIS — I10 Essential (primary) hypertension: Secondary | ICD-10-CM

## 2017-03-26 DIAGNOSIS — K219 Gastro-esophageal reflux disease without esophagitis: Secondary | ICD-10-CM

## 2017-03-26 DIAGNOSIS — Z0000 Encounter for general adult medical examination without abnormal findings: Secondary | ICD-10-CM | POA: Diagnosis not present

## 2017-03-26 DIAGNOSIS — Z23 Encounter for immunization: Secondary | ICD-10-CM | POA: Diagnosis not present

## 2017-03-26 DIAGNOSIS — D649 Anemia, unspecified: Secondary | ICD-10-CM | POA: Diagnosis not present

## 2017-03-26 DIAGNOSIS — M797 Fibromyalgia: Secondary | ICD-10-CM | POA: Diagnosis not present

## 2017-03-26 DIAGNOSIS — I5032 Chronic diastolic (congestive) heart failure: Secondary | ICD-10-CM | POA: Diagnosis not present

## 2017-03-26 DIAGNOSIS — E785 Hyperlipidemia, unspecified: Secondary | ICD-10-CM

## 2017-03-26 LAB — HEPATIC FUNCTION PANEL
ALBUMIN: 4 g/dL (ref 3.5–5.2)
ALK PHOS: 81 U/L (ref 39–117)
ALT: 11 U/L (ref 0–35)
AST: 14 U/L (ref 0–37)
BILIRUBIN DIRECT: 0.2 mg/dL (ref 0.0–0.3)
TOTAL PROTEIN: 6.5 g/dL (ref 6.0–8.3)
Total Bilirubin: 0.9 mg/dL (ref 0.2–1.2)

## 2017-03-26 LAB — POC URINALSYSI DIPSTICK (AUTOMATED)
Bilirubin, UA: NEGATIVE
CLARITY UA: NEGATIVE
GLUCOSE UA: NEGATIVE
KETONES UA: NEGATIVE
Leukocytes, UA: NEGATIVE
Nitrite, UA: NEGATIVE
RBC UA: NEGATIVE
UROBILINOGEN UA: 0.2 U/dL
pH, UA: 5.5 (ref 5.0–8.0)

## 2017-03-26 LAB — CBC WITH DIFFERENTIAL/PLATELET
BASOS PCT: 0.3 % (ref 0.0–3.0)
Basophils Absolute: 0 10*3/uL (ref 0.0–0.1)
EOS ABS: 0.2 10*3/uL (ref 0.0–0.7)
EOS PCT: 1.8 % (ref 0.0–5.0)
HCT: 38.3 % (ref 36.0–46.0)
Hemoglobin: 12.3 g/dL (ref 12.0–15.0)
LYMPHS ABS: 2 10*3/uL (ref 0.7–4.0)
Lymphocytes Relative: 22.8 % (ref 12.0–46.0)
MCHC: 32 g/dL (ref 30.0–36.0)
MCV: 84.2 fl (ref 78.0–100.0)
MONO ABS: 0.6 10*3/uL (ref 0.1–1.0)
Monocytes Relative: 6.4 % (ref 3.0–12.0)
NEUTROS ABS: 6 10*3/uL (ref 1.4–7.7)
NEUTROS PCT: 68.7 % (ref 43.0–77.0)
PLATELETS: 184 10*3/uL (ref 150.0–400.0)
RBC: 4.55 Mil/uL (ref 3.87–5.11)
RDW: 16.6 % — AB (ref 11.5–15.5)
WBC: 8.8 10*3/uL (ref 4.0–10.5)

## 2017-03-26 LAB — BASIC METABOLIC PANEL
BUN: 17 mg/dL (ref 6–23)
CALCIUM: 9.7 mg/dL (ref 8.4–10.5)
CHLORIDE: 106 meq/L (ref 96–112)
CO2: 30 meq/L (ref 19–32)
CREATININE: 0.91 mg/dL (ref 0.40–1.20)
GFR: 78.76 mL/min (ref >60.00)
GLUCOSE: 210 mg/dL — AB (ref 70–99)
Potassium: 3.8 mEq/L (ref 3.5–5.1)
SODIUM: 144 meq/L (ref 135–145)

## 2017-03-26 LAB — LIPID PANEL
CHOLESTEROL: 102 mg/dL (ref 0–200)
HDL: 39.4 mg/dL (ref >39.00)
LDL Cholesterol: 37 mg/dL (ref 0–99)
NonHDL: 62.89
Total CHOL/HDL Ratio: 3
Triglycerides: 127 mg/dL (ref 0.0–149.0)
VLDL: 25.4 mg/dL (ref 0.0–40.0)

## 2017-03-26 LAB — TSH: TSH: 1.74 u[IU]/mL (ref 0.35–4.50)

## 2017-03-26 NOTE — Progress Notes (Signed)
   Subjective:    Patient ID: [name], female    DOB: 11/03/1945, 71 y.o.   MRN: [id number]  HPI Here to follow up on issues. Her only complaint is right sided low back pain. She had surgery in April and her pain has improved but it has not totally resolved. She sees Dr. Louanne Skye for this. She uses an insulin pump and she sees Dr. Forde Dandy every 3 months for her diabetes. Her BP has been stable. She is past due for a colonoscopy. u   Review of Systems  Constitutional: Negative.   HENT: Negative.   Eyes: Negative.   Respiratory: Negative.   Cardiovascular: Negative.   Gastrointestinal: Negative.   Genitourinary: Negative for decreased urine volume, difficulty urinating, dyspareunia, dysuria, enuresis, flank pain, frequency, hematuria, pelvic pain and urgency.  Musculoskeletal: Positive for back pain.  Skin: Negative.   Neurological: Negative.   Psychiatric/Behavioral: Negative.        Objective:   Physical Exam  Constitutional: She is oriented to person, place, and time. She appears well-developed and well-nourished. No distress.  HENT:  Head: Normocephalic and atraumatic.  Right Ear: External ear normal.  Left Ear: External ear normal.  Nose: Nose normal.  Mouth/Throat: Oropharynx is clear and moist. No oropharyngeal exudate.  Eyes: Pupils are equal, round, and reactive to light. Conjunctivae and EOM are normal. No scleral icterus.  Neck: Normal range of motion. Neck supple. No JVD present. No thyromegaly present.  Cardiovascular: Normal rate, regular rhythm, normal heart sounds and intact distal pulses.  Exam reveals no gallop and no friction rub.   No murmur heard. Pulmonary/Chest: Effort normal and breath sounds normal. No respiratory distress. She has no wheezes. She has no rales. She exhibits no tenderness.  Abdominal: Soft. Bowel sounds are normal. She exhibits no distension and no mass. There is no tenderness. There is no rebound and no guarding.  Musculoskeletal:  Normal range of motion. She exhibits no edema or tenderness.  Lymphadenopathy:    She has no cervical adenopathy.  Neurological: She is alert and oriented to person, place, and time. She has normal reflexes. No cranial nerve deficit. She exhibits normal muscle tone. Coordination normal.  Skin: Skin is warm and dry. No rash noted. No erythema.  Psychiatric: She has a normal mood and affect. Her behavior is normal. Judgment and thought content normal.          Assessment & Plan:  Her HTN [place] stable. Her back pain is slowly improving. She sees Dr. Forde Dandy for the diabetes. We will get fasting labs today to check lipids, etc. Set up another colonoscopy. Given vaccines for flu and pneumonia. Alysia Penna, MD

## 2017-03-26 NOTE — Patient Instructions (Signed)
WE NOW OFFER   [facility] FAST TRACK!!!  SAME DAY Appointments for ACUTE CARE  Such as: Sprains, Injuries, cuts, abrasions, rashes, muscle pain, joint pain, back pain Colds, flu, sore throats, headache, allergies, cough, fever  Ear pain, sinus and eye infections Abdominal pain, nausea, vomiting, diarrhea, upset stomach Animal/insect bites  3 Easy Ways to Schedule: Walk-In Scheduling Call in scheduling Mychart Sign-up: [email or web address]         

## 2017-03-27 DIAGNOSIS — E118 Type 2 diabetes mellitus with unspecified complications: Secondary | ICD-10-CM | POA: Diagnosis not present

## 2017-03-27 LAB — HEMOGLOBIN A1C: HEMOGLOBIN A1C: 9.3 % — AB (ref 4.6–6.5)

## 2017-03-27 NOTE — Addendum Note (Signed)
Addended by: Aggie Hacker A on: 03/27/2017 03:36 PM   Modules accepted: Orders

## 2017-03-28 ENCOUNTER — Encounter: Payer: Self-pay | Admitting: Family Medicine

## 2017-04-01 ENCOUNTER — Encounter: Payer: Self-pay | Admitting: Family Medicine

## 2017-04-01 DIAGNOSIS — Z1231 Encounter for screening mammogram for malignant neoplasm of breast: Secondary | ICD-10-CM | POA: Diagnosis not present

## 2017-04-03 ENCOUNTER — Ambulatory Visit
Admission: RE | Admit: 2017-04-03 | Discharge: 2017-04-03 | Disposition: A | Discharge status: Home / Self Care | Payer: Medicare Other | Source: Ambulatory Visit | Attending: Specialist | Admitting: Specialist

## 2017-04-03 DIAGNOSIS — M5136 Other intervertebral disc degeneration, lumbar region: Secondary | ICD-10-CM

## 2017-04-03 DIAGNOSIS — M533 Sacrococcygeal disorders, not elsewhere classified: Secondary | ICD-10-CM

## 2017-04-03 DIAGNOSIS — M51369 Other intervertebral disc degeneration, lumbar region without mention of lumbar back pain or lower extremity pain: Secondary | ICD-10-CM

## 2017-04-03 DIAGNOSIS — M4327 Fusion of spine, lumbosacral region: Secondary | ICD-10-CM | POA: Diagnosis not present

## 2017-04-03 DIAGNOSIS — Z4889 Encounter for other specified surgical aftercare: Secondary | ICD-10-CM

## 2017-04-04 ENCOUNTER — Ambulatory Visit (INDEPENDENT_AMBULATORY_CARE_PROVIDER_SITE_OTHER): Payer: Medicare Other | Admitting: Surgery

## 2017-04-16 ENCOUNTER — Ambulatory Visit
Admission: RE | Admit: 2017-04-16 | Discharge: 2017-04-16 | Disposition: A | Discharge status: Home / Self Care | Payer: Medicare Other | Source: Ambulatory Visit | Attending: Hematology | Admitting: Hematology

## 2017-04-16 DIAGNOSIS — N6092 Unspecified benign mammary dysplasia of left breast: Secondary | ICD-10-CM

## 2017-04-16 DIAGNOSIS — N6489 Other specified disorders of breast: Secondary | ICD-10-CM | POA: Diagnosis not present

## 2017-04-16 MED ORDER — GADOBENATE DIMEGLUMINE 529 MG/ML IV SOLN
18.0000 mL | Freq: Once | INTRAVENOUS | Status: AC | PRN
  Administered 2017-04-16: 18 mL via INTRAVENOUS

## 2017-04-17 ENCOUNTER — Other Ambulatory Visit: Payer: Self-pay | Admitting: Hematology

## 2017-04-17 ENCOUNTER — Encounter (INDEPENDENT_AMBULATORY_CARE_PROVIDER_SITE_OTHER): Payer: Self-pay | Admitting: Specialist

## 2017-04-17 ENCOUNTER — Ambulatory Visit (INDEPENDENT_AMBULATORY_CARE_PROVIDER_SITE_OTHER): Payer: Medicare Other | Admitting: Specialist

## 2017-04-17 VITALS — BP 151/63 | HR 56 | Ht 64.0 in | Wt 190.0 lb

## 2017-04-17 DIAGNOSIS — M4326 Fusion of spine, lumbar region: Secondary | ICD-10-CM | POA: Diagnosis not present

## 2017-04-17 DIAGNOSIS — N6320 Unspecified lump in the left breast, unspecified quadrant: Secondary | ICD-10-CM

## 2017-04-17 DIAGNOSIS — N6092 Unspecified benign mammary dysplasia of left breast: Secondary | ICD-10-CM

## 2017-04-17 MED ORDER — TRAMADOL HCL 50 MG PO TABS: 50.0000 mg | ORAL_TABLET | Freq: Four times a day (QID) | ORAL | 0 refills | Status: DC | PRN

## 2017-04-17 NOTE — Progress Notes (Signed)
Office Visit Note   Patient: [name]           Date of Birth: [birth year]           MRN: [id number] Visit Date: 04/17/2017              Requested by: Laurey Morale, MD [place], [place] [address] PCP: Laurey Morale, MD   Assessment & Plan: Visit Diagnoses:  1. Fusion of spine of lumbar region     Plan: Avoid frequent bending and stooping  No lifting greater than 10 lbs. May use ice or moist heat for pain. Weight loss is of benefit. Handicap license is approved.    Follow-Up Instructions: Return in about 3 months (around 07/18/2017).   Orders:  No orders of the defined types were placed in this encounter.  No orders of the defined types were placed in this encounter.     Procedures: No procedures performed   Clinical Data: No additional findings.   Subjective: Chief Complaint  Patient presents with  . Lower Back - Follow-up    71 year old female with 6 month history of L4 to S1 fusion for spondylolisthesis at L5-S1 and degenerative disc disease L4-5. She reports the leg pain is better but hurts in the back and buttocks And above her waist. Underwent recent CT lumbar spine to assess her fusion. She walks with cane and notes that walking too much will increase the pain. Bowel and bladder function are normal. Her walking is limited, she can only walk about a forth of the mall length. Cough or sneeze without pain. Hurts to yawl on the right lower back. She is on Vitamin D supplements she fills monthly from Dr. Forde Dandy.    Review of Systems  Constitutional: Negative for activity change and unexpected weight change.  HENT: Negative.  Negative for nosebleeds, postnasal drip, rhinorrhea, sinus pain and sinus pressure.   Eyes: Negative.   Respiratory: Positive for wheezing. Negative for chest tightness and shortness of breath.   Cardiovascular: Negative.  Negative for leg swelling.  Gastrointestinal: Negative.  Negative for abdominal  distention, abdominal pain, anal bleeding, blood in stool, constipation, diarrhea, nausea, rectal pain and vomiting.  Endocrine: Negative.   Genitourinary: Negative.  Negative for difficulty urinating, enuresis, flank pain and frequency.  Musculoskeletal: Positive for back pain.  Skin: Negative.  Negative for color change, pallor, rash and wound.  Allergic/Immunologic: Negative.  Negative for environmental allergies, food allergies and immunocompromised state.  Neurological: Negative.   Hematological: Negative.   Psychiatric/Behavioral: Negative.  Negative for hallucinations, self-injury, sleep disturbance and suicidal ideas. The patient is not nervous/anxious and is not hyperactive.      Objective: Vital Signs: BP (!) 151/63 (BP Location: Left Arm, Patient Position: Sitting)   Pulse (!) 56   Ht 5\' 4"  (1.626 m)   Wt 190 lb (86.2 kg)   BMI 32.61 kg/m   Physical Exam  Constitutional: She is oriented to person, place, and time. She appears well-developed and well-nourished.  HENT:  Head: Normocephalic and atraumatic.  Eyes: Pupils are equal, round, and reactive to light. EOM are normal.  Neck: Normal range of motion. Neck supple.  Pulmonary/Chest: Effort normal and breath sounds normal.  Abdominal: Soft. Bowel sounds are normal.  Neurological: She is alert and oriented to person, place, and time.  Skin: Skin is warm and dry.  Psychiatric: She has a normal mood and affect. Her behavior is normal. Judgment and thought content normal.  Back Exam   Range of Motion  Extension: abnormal  Flexion: abnormal  Lateral Bend Right: abnormal  Lateral Bend Left: abnormal  Rotation Right: abnormal  Rotation Left: abnormal   Muscle Strength  Right Quadriceps:  5/5  Left Quadriceps:  5/5  Right Hamstrings:  5/5  Left Hamstrings:  5/5   Reflexes  Patellar: abnormal Achilles: abnormal Babinski's sign: normal   Other  Toe Walk: normal Heel Walk: normal      Specialty  Comments:  No specialty comments available.  Imaging: No results found.   PMFS History: Patient Active Problem List   Diagnosis Date Noted  . Anemia due to blood loss 11/02/2016    Priority: High    Class: Acute  . Spondylolisthesis, lumbar region 10/30/2016    Priority: High    Class: Chronic  . Spinal stenosis of lumbar region 10/30/2016    Priority: High    Class: Chronic  . Retained orthopedic hardware 10/30/2016    Priority: High    Class: Chronic  . History of lumbar spinal fusion   . History of fusion of lumbar spine   . Lethargy 10/31/2016  . AKI (acute kidney injury) ([place]) 10/31/2016  . Chronic diastolic CHF (congestive heart failure) ([place]) 10/31/2016  . Cough   . Leukocytosis   . Spinal stenosis of lumbar region with neurogenic claudication 10/30/2016  . Genetic testing 09/14/2015  . Atypical ductal hyperplasia of left breast 08/24/2015  . Family history of breast cancer   . Family history of prostate cancer   . Family history of stomach cancer   . Abnormal nuclear stress test 12/29/2014  . Cervical spondylosis with myelopathy and radiculopathy 06/11/2014  . Carpal tunnel syndrome 05/14/2014  . Aftercare following surgery of the circulatory system, [place] 03/11/2014  . Pain of right lower extremity 03/11/2014  . Atypical lobular hyperplasia of left breast 02/12/2014  . Fibromyalgia 09/21/2013  . Occlusion and stenosis of carotid artery without mention of cerebral infarction 02/29/2012  . Back abscess 01/22/2012  . Chest pain with moderate risk of acute coronary syndrome 12/09/2009  . Shortness of breath 06/10/2009  . Orthostatic hypotension 01/31/2009  . CAD S/P RCA DES June 2010 12/23/2008  . Normocytic anemia 07/21/2008  . Headache(784.0) 07/21/2008  . History of TIA (transient ischemic attack) 07/21/2008  . Edema 02/27/2008  . Diabetes mellitus with complication ([place]) 40/97/3532  . Depression 07/16/2007  . Dyslipidemia 02/21/2007  . Essential  hypertension 02/21/2007  . ASTHMA 02/21/2007  . GERD 02/21/2007  . LOW BACK PAIN 02/21/2007   Past Medical History:  Diagnosis Date  . Abnormal nuclear stress test 12/29/2014  . Allergy   . Anemia, unspecified   . Arthritis   . CAD (coronary artery disease)    sees. Dr. Burt Knack. s/p cath with PCI of RCA (xience des) June 2010. Pt also with LAD and D2 dzs-NL FFR in both areas med Rx  . Carotid artery occlusion   . Cataract    sees Dr. Herbert Deaner   . Depressive disorder, not elsewhere classified   . Duodenal nodule   . Edema   . Fibromyalgia   . Gastric polyps    COLON  . GERD (gastroesophageal reflux disease)   . Glaucoma    narrow angle, sees Dr. Herbert Deaner   . Headache(784.0)   . Hearing loss    left ear  . Hemorrhoids   . HSV (herpes simplex virus) anogenital infection 05/2014  . HTN (hypertension)   . Hyperlipidemia   . Low back  pain   . Obesity   . Peripheral vascular disease ([place])   . Stroke [facility])    TIA  "many yrs ago"    . TIA (transient ischemic attack)    also Hx of it.   . Type II or unspecified type diabetes mellitus without mention of complication, not stated as uncontrolled    sees Dr. Carrolyn Meiers     Family History  Problem Relation Age of Onset  . Hypertension Mother   . Hypertension Sister   . Hyperlipidemia Sister   . Hypertension Brother   . Cancer Brother        PROSTATE/LUNG  . Diabetes Brother   . Heart disease Brother        Before age 58 - Bypass  . Varicose Veins Brother   . Cancer Father        Prostate and pancreatic   . Stomach cancer Maternal Grandmother 8  . Heart disease Maternal Grandfather   . Breast cancer Maternal Aunt 27  . Cancer Maternal Aunt        Stomach  . Breast cancer Cousin 45       maternal first cousin  . Prostate cancer Cousin 79  . Breast cancer Cousin 44  . Breast cancer Cousin 40       maternal first cousin's daughter  . Coronary artery disease Neg Hx        premature CAD  . Colon cancer Neg Hx   .  Esophageal cancer Neg Hx     Past Surgical History:  Procedure Laterality Date  . ABDOMINAL HYSTERECTOMY  age 73   TAH and USO  Leiomyomata  . ANTERIOR CERVICAL CORPECTOMY N/A 06/11/2014   Procedure: ANTERIOR CERVICAL CORPECTOMY CERVICAL SIX; WITH CERVICAL FIVE TO CERVICAL SEVEN ARTHRODESIS;  Surgeon: Ashok Pall, MD;  Location: [place] NEURO ORS;  Service: Neurosurgery;  Laterality: N/A;  . BACK SURGERY  2008   lumb fusion  . BREAST BIOPSY Left 01/26/2014   Procedure: EXCISION LEFT BREAST MASS;  Surgeon: Adin Hector, MD;  Location: [place];  Service: General;  Laterality: Left;  . CARDIAC CATHETERIZATION    . CARDIAC CATHETERIZATION N/A 12/29/2014   Procedure: Left Heart Cath and Coronary Angiography;  Surgeon: Sherren Mocha, MD;  Location: [place] CV LAB;  Service: Cardiovascular;  Laterality: N/A;  . CAROTID ENDARTERECTOMY  march 2012   per Dr. Morton Amy  . CARPAL TUNNEL RELEASE Left 05/14/2014   Procedure: Left Carpal tunnel release;  Surgeon: Ashok Pall, MD;  Location: [place] NEURO ORS;  Service: Neurosurgery;  Laterality: Left;  Left Carpal tunnel release  . CARPAL TUNNEL RELEASE Bilateral   . CHOLECYSTECTOMY    . COLONOSCOPY  05-29-11   per Dr. Henrene Pastor, benign polyps, repeat in 5 yrs    . DILATION AND CURETTAGE OF UTERUS    . ESOPHAGOGASTRODUODENOSCOPY  02-08-06   per Dr. Henrene Pastor, gastritis   . EYE SURGERY Left June 2016   Cataract  . GLAUCOMA SURGERY     with laser, per Dr. Henrene Pastor   . HARDWARE REMOVAL  10/30/2016   Procedure: HARDWARE REMOVAL;  Surgeon: Jessy Oto, MD;  Location: [place];  Service: Orthopedics;;  . LUMBAR FUSION  2008  . POSTERIOR LUMBAR FUSION  10/30/2016   Removal of hardware rods and pedicle screws lumbar four, Extension of fusion L4-5 to L5-S1 with reinsertion of screws at L 4, left L5-S1 transforaminal lumbar interbody fusion, Exploration  left L5 nerve root, bilateral decompressive laminectomy  L3-4/notes 10/30/2016  . ULNAR NERVE  TRANSPOSITION Left 05/14/2014   Procedure: Left Ulnar nerve decompression;  Surgeon: Ashok Pall, MD;  Location: [place] NEURO ORS;  Service: Neurosurgery;  Laterality: Left;  Left Ulnar nerve decompression   Social History   Occupational History  . Not on file.   Social History Main Topics  . Smoking status: Former Smoker    Quit date: 01/22/1967  . Smokeless tobacco: Never Used  . Alcohol use No  . Drug use: No  . Sexual activity: No     Comment: HYST-1st intercourse 40 yo-5 partners

## 2017-04-17 NOTE — Patient Instructions (Signed)
Plan: Avoid frequent bending and stooping  No lifting greater than 10 lbs. May use ice or moist heat for pain. Weight loss is of benefit. Handicap license is approved.

## 2017-04-22 ENCOUNTER — Encounter: Payer: Self-pay | Admitting: Hematology

## 2017-04-22 ENCOUNTER — Telehealth (INDEPENDENT_AMBULATORY_CARE_PROVIDER_SITE_OTHER): Payer: Self-pay | Admitting: Specialist

## 2017-04-22 NOTE — Telephone Encounter (Signed)
Patient called needing to know if Dr Louanne Skye can write a RX for her to start land and water therapy. The number to contact patient is [phone]

## 2017-04-22 NOTE — Telephone Encounter (Signed)
Patient called needing to know if Dr Louanne Skye can write a RX for her to start land and water therapy.

## 2017-04-23 ENCOUNTER — Ambulatory Visit (INDEPENDENT_AMBULATORY_CARE_PROVIDER_SITE_OTHER): Payer: Medicare Other | Admitting: Family Medicine

## 2017-04-23 ENCOUNTER — Encounter: Payer: Self-pay | Admitting: Internal Medicine

## 2017-04-23 ENCOUNTER — Encounter: Payer: Self-pay | Admitting: Family Medicine

## 2017-04-23 ENCOUNTER — Ambulatory Visit (INDEPENDENT_AMBULATORY_CARE_PROVIDER_SITE_OTHER)
Admission: RE | Admit: 2017-04-23 | Discharge: 2017-04-23 | Disposition: A | Discharge status: Home / Self Care | Payer: Medicare Other | Source: Ambulatory Visit | Attending: Family Medicine | Admitting: Family Medicine

## 2017-04-23 ENCOUNTER — Other Ambulatory Visit: Payer: Medicare Other

## 2017-04-23 VITALS — BP 118/54 | Temp 98.5°F | Ht 64.0 in | Wt 191.0 lb

## 2017-04-23 DIAGNOSIS — R0789 Other chest pain: Secondary | ICD-10-CM | POA: Diagnosis not present

## 2017-04-23 DIAGNOSIS — R0781 Pleurodynia: Secondary | ICD-10-CM | POA: Diagnosis not present

## 2017-04-23 NOTE — Progress Notes (Signed)
   Subjective:    Patient ID: [name], female    DOB: Mar 24, 1946, 71 y.o.   MRN: [id number]  HPI Here complaining of bilateral rib pain, right worse than left. She has been seeing Dr. Burr Medico about a left breast mass, and on 04-16-17 she had MRI scans of both breasts. This revealed a 1.7 cm mass in the left breast, and an MR guided needle biopsy is being scheduled for this. During the [place] scan, Thaily was asked to lie down face first and she immediately felt sharp pain in the ribs beneath both breasts. She was able to complete the scan but she was in a lot of pain. She has continued to have pain ever since. No SOB.    Review of Systems  Constitutional: Negative.   Respiratory: Negative.   Cardiovascular: Positive for chest pain. Negative for palpitations and leg swelling.  Neurological: Negative.        Objective:   Physical Exam  Constitutional: She is oriented to person, place, and time.  In pain   Cardiovascular: Normal rate, regular rhythm, normal heart sounds and intact distal pulses.   Pulmonary/Chest: Effort normal and breath sounds normal. No respiratory distress. She has no wheezes. She has no rales.  She is extremely tender in the anterior ribs just inferior to the right breast. She is less tender in the left anterior ribs. No crepitus.   Neurological: She is alert and oriented to person, place, and time.          Assessment & Plan:  Rib pain from a recent MR scan. Her ribs are likely bruised but we will send her for Xrays today to make sure she has no cracks or fractures. She can use Tramadol for pain as needed.  Alysia Penna, MD

## 2017-04-23 NOTE — Patient Instructions (Signed)
WE NOW OFFER   [name]'s FAST TRACK!!!  SAME DAY Appointments for ACUTE CARE  Such as: Sprains, Injuries, cuts, abrasions, rashes, muscle pain, joint pain, back pain Colds, flu, sore throats, headache, allergies, cough, fever  Ear pain, sinus and eye infections Abdominal pain, nausea, vomiting, diarrhea, upset stomach Animal/insect bites  3 Easy Ways to Schedule: Walk-In Scheduling Call in scheduling Mychart Sign-up: [email or web address]         

## 2017-04-25 DIAGNOSIS — H402213 Chronic angle-closure glaucoma, right eye, severe stage: Secondary | ICD-10-CM | POA: Diagnosis not present

## 2017-04-25 DIAGNOSIS — H402223 Chronic angle-closure glaucoma, left eye, severe stage: Secondary | ICD-10-CM | POA: Diagnosis not present

## 2017-05-08 ENCOUNTER — Other Ambulatory Visit: Payer: Self-pay | Admitting: Family Medicine

## 2017-05-08 ENCOUNTER — Other Ambulatory Visit: Payer: Self-pay | Admitting: Cardiovascular Disease

## 2017-05-09 ENCOUNTER — Telehealth (INDEPENDENT_AMBULATORY_CARE_PROVIDER_SITE_OTHER): Payer: Self-pay | Admitting: Specialist

## 2017-05-09 NOTE — Telephone Encounter (Signed)
Patient would like to speak with you about therapy. She would like to referred to [facility] [place] if possible. Thank you. CB # [phone]

## 2017-05-09 NOTE — Telephone Encounter (Signed)
Patient would like to speak with you about therapy. She would like to referred to [facility] [place] if possible.

## 2017-05-10 NOTE — Telephone Encounter (Signed)
Can we refill this? Not sure if you have filled in the past?

## 2017-05-14 ENCOUNTER — Ambulatory Visit: Payer: Medicare Other | Admitting: Gynecology

## 2017-05-14 ENCOUNTER — Telehealth: Payer: Self-pay | Admitting: *Deleted

## 2017-05-14 NOTE — Telephone Encounter (Signed)
Pt called requesting to talk to Dr. Burr Medico.  Stated pt was informed that she would be contacted to have repeated CT scan of her chest soon.  Pt still has not heard anything yet.  Pt would like to talk to Dr. Burr Medico for clarifications of some issues. Pt's   Phone     [phone].

## 2017-05-15 ENCOUNTER — Ambulatory Visit (INDEPENDENT_AMBULATORY_CARE_PROVIDER_SITE_OTHER): Payer: Medicare Other | Admitting: Gynecology

## 2017-05-15 ENCOUNTER — Encounter: Payer: Self-pay | Admitting: Gynecology

## 2017-05-15 VITALS — BP 140/80

## 2017-05-15 DIAGNOSIS — N764 Abscess of vulva: Secondary | ICD-10-CM | POA: Diagnosis not present

## 2017-05-15 NOTE — Progress Notes (Signed)
    [name] February 19, 1946 [id number]        71 y.o.  G2P1011 presents with several days of worsening right labial abscess.  History of same before.  No fever or chills.  No groin discomfort.  No urinary or GI symptoms.  Past medical history,surgical history, problem list, medications, allergies, family history and social history were all reviewed and documented in the EPIC chart.  Directed ROS with pertinent positives and negatives documented in the history of present illness/assessment and plan.  Exam: [name] assistant Vitals:   05/15/17 1126  BP: 140/80   General appearance:  Normal External BUS vagina with 2-3 cm fluctuant lower right labia majora abscess.  Not pointing.  No surrounding cellulitis.  No inguinal adenopathy.  Vagina otherwise normal with atrophic changes.  Bimanual without masses or tenderness. Physical Exam  Genitourinary:        Procedure: The skin overlying the fluctuant portion of the abscess was cleansed with Betadine and subsequently infiltrated with 1% lidocaine.  The abscess was entered with a stab incision using a scalpel and purulent material extruded.  Mosquito forceps were introduced into the cavity to assure complete emptying and break up any loculations.  Silver nitrate was applied to the skin for hemostasis.  Patient tolerated well.  Postoperative instructions given to include sitz baths and heat.  Assessment/Plan:  71 y.o. G2P1011 with right lower labial abscess successfully drained.  Do not feel antibiotics needed with no evidence of cellulitis or more involved infection.  Patient will use sitz baths 2-3 times daily with heat afterwards.  She will follow-up if this area does not heal or certainly recurs.    [name], 12:01 PM 05/15/2017

## 2017-05-15 NOTE — Patient Instructions (Signed)
Use sitz baths 2-3 times daily with hair drying heat application afterwards.  Follow-up if the abscess persists/recurs.

## 2017-05-21 ENCOUNTER — Other Ambulatory Visit: Payer: Self-pay | Admitting: Cardiovascular Disease

## 2017-05-22 ENCOUNTER — Other Ambulatory Visit: Payer: Self-pay | Admitting: Hematology

## 2017-05-22 ENCOUNTER — Telehealth: Payer: Self-pay

## 2017-05-22 NOTE — Telephone Encounter (Signed)
[name] and [name],  She has normal (per pt) mammogram at [facility] a few months ago, but MRI (ordered for screening due to high risk) showed two breast lesions, and MRI guided biopsy was recommended.  But patient experienced extreme pain after MRI, and is very reluctant to have MRI guided biopsy. I have ordered the biopsy one month ago but it has not been scheduled.   Could you call [facility] to see if they can do [place] first and try biopsy if ultrasound positive?  If ultrasound negative, please let them to call patient and explained to her exactly what to anticipate from MRI guided breast biopsy, she may be willing to schedule a biopsy.   Thanks much,  Truitt Merle MD

## 2017-05-22 NOTE — Telephone Encounter (Addendum)
Pt called stating Dr Burr Medico talked with pt and she was to have a repeat MRI breast in about 2 weeks. It has been 3 weeks and no mention of an appt. There are some MR left breast bx orders that were placed 10/10. She is asking what date she is supposed to go. Is she supposed to get breast MRI or breast bx? This RN cannot see if it is authorized yet.  Pt prefers we make an appt for her.  In the morning is better time for her.

## 2017-06-03 ENCOUNTER — Other Ambulatory Visit: Payer: Medicare Other

## 2017-06-05 ENCOUNTER — Telehealth: Payer: Self-pay | Admitting: *Deleted

## 2017-06-05 ENCOUNTER — Encounter: Payer: Self-pay | Admitting: Hematology

## 2017-06-05 DIAGNOSIS — R928 Other abnormal and inconclusive findings on diagnostic imaging of breast: Secondary | ICD-10-CM | POA: Diagnosis not present

## 2017-06-05 NOTE — Telephone Encounter (Signed)
Patient is for recall colon on 06/26/17 with Dr.Perry. She uses insulin pump managed by Dr.South. Please contact Dr.South for insulin pump instructions before upcoming pre-visit. Thanks, Robbin pv

## 2017-06-05 NOTE — Telephone Encounter (Signed)
Faxed insulin pump letter to Dr. Forde Dandy.

## 2017-06-06 ENCOUNTER — Telehealth: Payer: Self-pay | Admitting: *Deleted

## 2017-06-06 NOTE — Telephone Encounter (Signed)
Ms Duclos left a message stating Dr Burr Medico told her she could call to discuss her biopsy done at [facility]. Would like Dr Burr Medico to call her at [phone]

## 2017-06-06 NOTE — Telephone Encounter (Signed)
I called her back, she is scheduled to have MRI guided breast biopsy on 06/18/2017. I encouraged her to keep that appointment, she is very concerned about the MRI caused pain, I encourage her to take pain meds before biopsy. She agrees to do the biopsy. I will call her after the biopsy result returns. She appreciated the call.   Truitt Merle MD

## 2017-06-07 ENCOUNTER — Telehealth: Payer: Self-pay | Admitting: Medical Oncology

## 2017-06-07 NOTE — Telephone Encounter (Signed)
Dr Burr Medico called her yesterday

## 2017-06-13 ENCOUNTER — Other Ambulatory Visit: Payer: Self-pay

## 2017-06-13 ENCOUNTER — Ambulatory Visit (AMBULATORY_SURGERY_CENTER): Payer: Self-pay | Admitting: *Deleted

## 2017-06-13 VITALS — Ht 64.0 in | Wt 193.0 lb

## 2017-06-13 DIAGNOSIS — Z86010 Personal history of colonic polyps: Secondary | ICD-10-CM

## 2017-06-13 MED ORDER — NA SULFATE-K SULFATE-MG SULF 17.5-3.13-1.6 GM/177ML PO SOLN: 1.0000 [IU] | Freq: Once | ORAL | 0 refills | Status: AC

## 2017-06-13 NOTE — Progress Notes (Signed)
No egg or soy allergy known to patient  No issues with past sedation with any surgeries  or procedures, no intubation problems  No diet pills per patient No home 02 use per patient  No blood thinners per patient  Pt denies issues with constipation  No A fib or A flutter  EMMI video sent to pt's e mail  

## 2017-06-18 ENCOUNTER — Other Ambulatory Visit: Payer: Medicare Other

## 2017-06-20 ENCOUNTER — Ambulatory Visit: Payer: Medicare Other | Admitting: Family

## 2017-06-20 ENCOUNTER — Encounter (HOSPITAL_COMMUNITY): Payer: Medicare Other

## 2017-06-24 DIAGNOSIS — E1159 Type 2 diabetes mellitus with other circulatory complications: Secondary | ICD-10-CM | POA: Diagnosis not present

## 2017-06-24 DIAGNOSIS — Z8673 Personal history of transient ischemic attack (TIA), and cerebral infarction without residual deficits: Secondary | ICD-10-CM | POA: Diagnosis not present

## 2017-06-24 DIAGNOSIS — Z6832 Body mass index (BMI) 32.0-32.9, adult: Secondary | ICD-10-CM | POA: Diagnosis not present

## 2017-06-24 DIAGNOSIS — I2510 Atherosclerotic heart disease of native coronary artery without angina pectoris: Secondary | ICD-10-CM | POA: Diagnosis not present

## 2017-06-24 DIAGNOSIS — D6489 Other specified anemias: Secondary | ICD-10-CM | POA: Diagnosis not present

## 2017-06-24 DIAGNOSIS — N62 Hypertrophy of breast: Secondary | ICD-10-CM | POA: Diagnosis not present

## 2017-06-24 DIAGNOSIS — I10 Essential (primary) hypertension: Secondary | ICD-10-CM | POA: Diagnosis not present

## 2017-06-24 DIAGNOSIS — I6523 Occlusion and stenosis of bilateral carotid arteries: Secondary | ICD-10-CM | POA: Diagnosis not present

## 2017-06-24 DIAGNOSIS — E7849 Other hyperlipidemia: Secondary | ICD-10-CM | POA: Diagnosis not present

## 2017-06-24 DIAGNOSIS — M48061 Spinal stenosis, lumbar region without neurogenic claudication: Secondary | ICD-10-CM | POA: Diagnosis not present

## 2017-06-24 DIAGNOSIS — F3289 Other specified depressive episodes: Secondary | ICD-10-CM | POA: Diagnosis not present

## 2017-06-25 ENCOUNTER — Other Ambulatory Visit: Payer: Self-pay | Admitting: Cardiovascular Disease

## 2017-06-25 NOTE — Telephone Encounter (Signed)
Left 2 messages with [place] at [facility] regarding insulin pump instructions

## 2017-06-25 NOTE — Telephone Encounter (Signed)
Per [place] at [facility], patient should not bolus while not eating solid food and resume as normal after procedure.  Called patient and relayed this information.  Patient understands.

## 2017-06-26 ENCOUNTER — Ambulatory Visit (AMBULATORY_SURGERY_CENTER): Payer: Medicare Other | Admitting: Internal Medicine

## 2017-06-26 ENCOUNTER — Encounter: Payer: Self-pay | Admitting: Internal Medicine

## 2017-06-26 VITALS — BP 187/85 | HR 78 | Temp 97.8°F | Resp 16 | Ht 64.0 in | Wt 193.0 lb

## 2017-06-26 DIAGNOSIS — Z860100 Personal history of colon polyps, unspecified: Secondary | ICD-10-CM

## 2017-06-26 DIAGNOSIS — E109 Type 1 diabetes mellitus without complications: Secondary | ICD-10-CM | POA: Diagnosis not present

## 2017-06-26 DIAGNOSIS — I509 Heart failure, unspecified: Secondary | ICD-10-CM | POA: Diagnosis not present

## 2017-06-26 DIAGNOSIS — I2510 Atherosclerotic heart disease of native coronary artery without angina pectoris: Secondary | ICD-10-CM | POA: Diagnosis not present

## 2017-06-26 DIAGNOSIS — D124 Benign neoplasm of descending colon: Secondary | ICD-10-CM | POA: Diagnosis not present

## 2017-06-26 DIAGNOSIS — I10 Essential (primary) hypertension: Secondary | ICD-10-CM | POA: Diagnosis not present

## 2017-06-26 HISTORY — PX: COLONOSCOPY: SHX174

## 2017-06-26 MED ORDER — SODIUM CHLORIDE 0.9 % IV SOLN: 500.0000 mL | Freq: Once | INTRAVENOUS | Status: DC

## 2017-06-26 NOTE — Patient Instructions (Signed)
YOU HAD AN ENDOSCOPIC PROCEDURE TODAY AT [facility]:   Refer to the procedure report that was given to you for any specific questions about what was found during the examination.  If the procedure report does not answer your questions, please call your gastroenterologist to clarify.  If you requested that your care partner not be given the details of your procedure findings, then the procedure report has been included in a sealed envelope for you to review at your convenience later.  YOU SHOULD EXPECT: Some feelings of bloating in the abdomen. Passage of more gas than usual.  Walking can help get rid of the air that was put into your GI tract during the procedure and reduce the bloating. If you had a lower endoscopy (such as a colonoscopy or flexible sigmoidoscopy) you may notice spotting of blood in your stool or on the toilet paper. If you underwent a bowel prep for your procedure, you may not have a normal bowel movement for a few days.  Please Note:  You might notice some irritation and congestion in your nose or some drainage.  This is from the oxygen used during your procedure.  There is no need for concern and it should clear up in a day or so.  SYMPTOMS TO REPORT IMMEDIATELY:   Following lower endoscopy (colonoscopy or flexible sigmoidoscopy):  Excessive amounts of blood in the stool  Significant tenderness or worsening of abdominal pains  Swelling of the abdomen that is new, acute  Fever of 100F or higher   For urgent or emergent issues, a gastroenterologist can be reached at any hour by calling [phone].   DIET:  We do recommend a small meal at first, but then you may proceed to your regular diet.  Drink plenty of fluids but you should avoid alcoholic beverages for 24 hours.  ACTIVITY:  You should plan to take it easy for the rest of today and you should NOT DRIVE or use heavy machinery until tomorrow (because of the sedation medicines used during the test).     FOLLOW UP: Our staff will call the number listed on your records the next business day following your procedure to check on you and address any questions or concerns that you may have regarding the information given to you following your procedure. If we do not reach you, we will leave a message.  However, if you are feeling well and you are not experiencing any problems, there is no need to return our call.  We will assume that you have returned to your regular daily activities without incident.  If any biopsies were taken you will be contacted by phone or by letter within the next 1-3 weeks.  Please call us at [phone] if you have not heard about the biopsies in 3 weeks.    SIGNATURES/CONFIDENTIALITY: You and/or your care partner have signed paperwork which will be entered into your electronic medical record.  These signatures attest to the fact that that the information above on your After Visit Summary has been reviewed and is understood.  Full responsibility of the confidentiality of this discharge information lies with you and/or your care-partner.   Resume medications. Information given on polyps,diverticulosis and hemorrhoids.

## 2017-06-26 NOTE — Progress Notes (Signed)
To recovery, report to RN, VSS. 

## 2017-06-26 NOTE — Op Note (Signed)
[facility] Patient Name: [name] Procedure Date: 06/26/2017 9:03 AM MRN: [id number] Endoscopist: Docia Chuck. Henrene Pastor , MD Age: 71 Referring MD:  Date of Birth: [birth year] Gender: Female Account #: [id number] Procedure:                Colonoscopy, With cold snare polypectomy x 1 Indications:              High risk colon cancer surveillance: Personal                            history of non-advanced adenoma, High risk colon                            cancer surveillance: Personal history of sessile                            serrated colon polyp (less than 10 mm in size) with                            no dysplasia. Previous examinations 2007 and 2012 Medicines:                Monitored Anesthesia Care Procedure:                Pre-Anesthesia Assessment:                           - Prior to the procedure, a History and Physical                            was performed, and patient medications and                            allergies were reviewed. [name] tolerance of                            previous anesthesia was also reviewed. The risks                            and benefits of the procedure and the sedation                            options and risks were discussed with the patient.                            All questions were answered, and informed consent                            was obtained. Prior Anticoagulants: The patient has                            taken no previous anticoagulant or antiplatelet                            agents. ASA Grade Assessment: II - A patient with  mild systemic disease. After reviewing the risks                            and benefits, the patient was deemed in                            satisfactory condition to undergo the procedure.                           After obtaining informed consent, the colonoscope                            was passed under direct vision. Throughout the                  procedure, the patient's blood pressure, pulse, and                            oxygen saturations were monitored continuously. The                            Colonoscope was introduced through the anus and                            advanced to the the cecum, identified by                            appendiceal orifice and ileocecal valve. The                            ileocecal valve, appendiceal orifice, and rectum                            were photographed. The quality of the bowel                            preparation was good. The colonoscopy was performed                            without difficulty. The patient tolerated the                            procedure well. The bowel preparation used was                            SUPREP. Scope In: 9:18:44 AM Scope Out: 9:34:26 AM Scope Withdrawal Time: 0 hours 11 minutes 38 seconds  Total Procedure Duration: 0 hours 15 minutes 42 seconds  Findings:                 A 3 mm polyp was found in the descending colon. The                            polyp was removed with a cold snare. Resection and  retrieval were complete.                           Multiple diverticula were found in the transverse                            colon and left colon.                           Internal hemorrhoids were found during                            retroflexion. The hemorrhoids were small.                           The exam was otherwise without abnormality on                            direct and retroflexion views. Complications:            No immediate complications. Estimated blood loss:                            None. Estimated Blood Loss:     Estimated blood loss: none. Impression:               - One 3 mm polyp in the descending colon, removed                            with a cold snare. Resected and retrieved.                           - Diverticulosis in the transverse colon and in the                             left colon.                           - Internal hemorrhoids.                           - The examination was otherwise normal on direct                            and retroflexion views. Recommendation:           - Repeat colonoscopy in 5 years for surveillance.                           - Patient has a contact number available for                            emergencies. The signs and symptoms of potential                            delayed complications were discussed with the  patient. Return to normal activities tomorrow.                            Written discharge instructions were provided to the                            patient.                           - Resume previous diet.                           - Continue present medications.                           - Await pathology results. Docia Chuck. Henrene Pastor, MD 06/26/2017 9:42:27 AM This report has been signed electronically.

## 2017-06-26 NOTE — Progress Notes (Signed)
Called to room to assist during endoscopic procedure.  Patient ID and intended procedure confirmed with present staff. Received instructions for my participation in the procedure from the performing physician.  

## 2017-06-27 ENCOUNTER — Telehealth: Payer: Self-pay

## 2017-06-27 ENCOUNTER — Ambulatory Visit
Admission: RE | Admit: 2017-06-27 | Discharge: 2017-06-27 | Disposition: A | Discharge status: Home / Self Care | Payer: Medicare Other | Source: Ambulatory Visit | Attending: Hematology | Admitting: Hematology

## 2017-06-27 ENCOUNTER — Telehealth: Payer: Self-pay | Admitting: *Deleted

## 2017-06-27 DIAGNOSIS — N6012 Diffuse cystic mastopathy of left breast: Secondary | ICD-10-CM | POA: Diagnosis not present

## 2017-06-27 DIAGNOSIS — N6320 Unspecified lump in the left breast, unspecified quadrant: Secondary | ICD-10-CM

## 2017-06-27 DIAGNOSIS — N6323 Unspecified lump in the left breast, lower outer quadrant: Secondary | ICD-10-CM | POA: Diagnosis not present

## 2017-06-27 DIAGNOSIS — N6489 Other specified disorders of breast: Secondary | ICD-10-CM | POA: Diagnosis not present

## 2017-06-27 MED ORDER — GADOBENATE DIMEGLUMINE 529 MG/ML IV SOLN: 18.0000 mL | Freq: Once | INTRAVENOUS | Status: DC | PRN

## 2017-06-27 NOTE — Telephone Encounter (Signed)
  Follow up Call-  Call back number 06/26/2017  Post procedure Call Back phone  # [phone]  Permission to leave phone message Yes  Some recent data might be hidden     Patient questions:  Do you have a fever, pain , or abdominal swelling? No. Pain Score  0 *  Have you tolerated food without any problems? Yes.    Have you been able to return to your normal activities? Yes.    Do you have any questions about your discharge instructions: Diet   No. Medications  No. Follow up visit  No.  Do you have questions or concerns about your Care? No.  Actions: * If pain score is 4 or above: No action needed, pain <4.

## 2017-06-27 NOTE — Telephone Encounter (Signed)
No answer, message left for the patient. 

## 2017-07-03 ENCOUNTER — Encounter: Payer: Self-pay | Admitting: Internal Medicine

## 2017-07-07 ENCOUNTER — Other Ambulatory Visit: Payer: Self-pay | Admitting: Cardiovascular Disease

## 2017-07-07 DIAGNOSIS — I10 Essential (primary) hypertension: Secondary | ICD-10-CM

## 2017-07-15 ENCOUNTER — Ambulatory Visit: Payer: Self-pay | Admitting: Surgery

## 2017-07-15 DIAGNOSIS — N6320 Unspecified lump in the left breast, unspecified quadrant: Secondary | ICD-10-CM

## 2017-07-15 NOTE — H&P (Signed)
[name] Documented: 07/15/2017 9:48 AM Location: [facility] Patient #: [id number] DOB: 1946/04/01 Married / Language: English / Race: Black or African American Female  History of Present Illness Karen Moores [name] MD; 07/15/2017 12:04 PM) Patient words: discordant breast biopsy             Patient sent at the request of Dr. Owens Shark for abnormal left mammogram. The patient has a history of atypical lobular hyperplasia status post left breast lumpectomy to me in 2050. She opted out of tamoxifen prevention. She was found to have 2 areas on her recent screening mammogram in her left breast one in the anterior lower inner and the other in the upper outer quadrant left breast. Biopsy results showed fibrocystic change. The upper outer pathology and imaging were felt to be discordant. Patient denies breast pain nipple discharge or breast mass.       The pathology revealed fibrocystic change with calcifications for the posterior upper outer quadrant lesion left breast and the anterior lower-inner quadrant left breast lesion. The findings are found to be discordant per Dr. Owens Shark and excision is recommended for the posterior upper outer quadrant lesion. The findings are found to be concordant for in the anterior lower-inner quadrant left breast lesion. I discussed the results with the patient and answered her questions. The patient is doing well post biopsy without complications. The patient is informed that she has a surgical appointment with Dr. Malachi Paradise at [facility] surgery on Friday July 12, 2017 8:50 a.m. Electronically Signed By: [name] M.D. On: 06/28/2017 12:05 Addended by Karen Gross, MD on 06/28/2017 12:08 PM  Study Result CLINICAL DATA: Patient presents for MR guided core biopsy of 2 areas of concern within the left breast. History of atypical ductal hyperplasia in the left breast. EXAM: MRI GUIDED CORE NEEDLE BIOPSY OF THE LEFT BREAST x2  TECHNIQUE: Multiplanar, multisequence MR imaging of the left breast was performed both before and after administration of intravenous contrast. CONTRAST: 18 cc MultiHance Creatinine was obtained on site at [facility] at [address] Results: Creatinine 0.8 mg/dL. BUN 14. GFR 87. COMPARISON: 04/16/2017 FINDINGS: I met with the patient, and we discussed the procedure of MRI guided biopsy, including risks, benefits, and alternatives. Specifically, we discussed the risks of infection, bleeding, tissue injury, clip migration, and inadequate sampling. Informed, written consent was given. The usual time out protocol was performed immediately prior to the procedure. Using sterile technique, 1% Lidocaine, MRI guidance, and a 9 gauge vacuum assisted device, biopsy was performed of enhancing mass in the posterior lower outer quadrant of the left breast using a lateral approach. At the conclusion of the procedure, a bow tie shaped tissue marker clip was deployed into the biopsy cavity. Follow-up 2-view mammogram was performed and dictated separately. Using sterile technique, 1% Lidocaine, MRI guidance, and a 9 gauge vacuum assisted device, biopsy was performed of non mass enhancement in the anterior lower inner quadrant of the left breast using a lateral approach. At the conclusion of the procedure, a cylinder-shaped tissue marker clip was deployed into the biopsy cavity. Follow-up 2-view mammogram was performed and dictated separately. IMPRESSION: MRI guided biopsy of 2 areas of concern in the left breast. No apparent complications. Electronically Signed: By: [name] M.D. On: 06/27/2017 09:46      Diagnosis 1. Breast, left, needle core biopsy, posterior upper outer quadrant - FIBROCYSTIC CHANGES. - THERE IS NO EVIDENCE OF MALIGNANCY. - SEE COMMENT. 2. Breast, left, needle core biopsy, anterior  lower inner quadrant - FIBROCYSTIC CHANGES. - THERE IS NO EVIDENCE OF  MALIGNANCY. - SEE COMMENT. Microscopic Comment 1. , 2. The results were called to [facility] on 06/28/2017. ([name]:ecj 06/29/1027) Karen Cutter MD Pathologist, Electronic Signature (Case signed 06/28/2017) Specimen [name] and Clinical Information Specimen Comment 1. Formalin time: 8:35 AM; extracted < 5 min; enhancing mass bow tie clip 2. Non mass linear enhancement cylinder clip Specimen(s) Obtained: 1. Breast, left, needle core biopsy, posterior upper outer quadrant 2. Breast, left, needle core biopsy, anterior lower inner quadrant Specimen Clinical Information 1. Possible IMC 2. Possible FCC or DCIS         CLINICAL DATA: Greater than 20% calculated lifetime risk of developing breast cancer. History of atypical ductal hyperplasia of the left breast. The patient was unable to take tamoxifen.  LABS: None obtained today.  EXAM: BILATERAL BREAST MRI WITH AND WITHOUT CONTRAST  TECHNIQUE: Multiplanar, multisequence MR images of both breasts were obtained prior to and following the intravenous administration of 18 cc of MultiHance.  THREE-DIMENSIONAL MR IMAGE RENDERING ON INDEPENDENT WORKSTATION:  Three-dimensional MR images were rendered by post-processing of the original MR data on an independent workstation. The three-dimensional MR images were interpreted, and findings are reported in the following complete MRI report for this study. Three dimensional images were evaluated at the independent Dyna[employer]ad workstation  COMPARISON: Previous mammograms at [facility], the most recent dated 04/01/2017.  FINDINGS: Breast composition: c. Heterogeneous fibroglandular tissue.  Background parenchymal enhancement: Moderate.  Right breast: No mass or abnormal enhancement.  Left breast: There is an irregular area of mass-like enhancement in the posterior aspect of the upper outer quadrant of the left breast in approximately the 2 o'clock position.  This measures 1.7 x 1.3 x 1.3 cm in maximum dimensions. This has predominantly rapid wash-in/washout enhancement kinetics.  More posteriorly and superiorly in the upper-outer quadrant of the left breast, there is a 2.0 x 1.5 x 0.2 cm area of non mass enhancement with rapid wash-in/washout enhancement kinetics.  Lymph nodes: No abnormal appearing lymph nodes.  Ancillary findings: None.  IMPRESSION: 1. 1.7 x 1.3 x 1.3 cm mass-like area of enhancement with MR features suspicious for malignancy in the posterior aspect of the upper-outer quadrant of the left breast. 2. 2.0 x 1.5 x 0.2 cm area of non mass enhancement with imaging features suspicious for the possibility of malignancy more posteriorly and superiorly in the upper-outer quadrant of the left breast. 3. No evidence of malignancy on the right.  RECOMMENDATION: MR guided core needle biopsy of the 1.7 cm area of mass-like enhancement in the upper-outer quadrant of the left breast and MR guided core needle biopsy of the 2.0 cm area of non mass enhancement in the upper-outer quadrant of the left breast.  BI-RADS CATEGORY 4: Suspicious.   Electronically Signed By: [name] M.D. On: 04/16/2017 15:23.  The patient is a 72 year old female.   Past Surgical History [name], CMA; 07/15/2017 9:49 AM) Breast Biopsy Left. Carotid Artery Surgery Left. Cataract Surgery Bilateral. Foot Surgery Bilateral. Hysterectomy (not due to cancer) - Partial Knee Surgery Bilateral. Spinal Surgery - Lower Back  Diagnostic Studies History [name], CMA; 07/15/2017 9:49 AM) Colonoscopy within last year Mammogram within last year  Allergies [name], CMA; 07/15/2017 9:49 AM) No Known Drug Allergies [07/15/2017]:  Medication History [name], [place]; 07/15/2017 9:53 AM) ProAir HFA (108 (90 Base)MCG/ACT Aerosol Soln, Inhalation) Active. Aspirin (81MG Tablet, Oral) Active. Diclofenac  Sodium (1% Gel, External) Active. Vitamin D2 (Oral) Specific strength unknown - Active. Furosemide (40MG Tablet, Oral) Active. HydrALAZINE HCl (100MG Tablet, Oral) Active. HumaLOG KwikPen (100UNIT/ML Soln Pen-inj, Subcutaneous) Active. HumaLOG (100UNIT/ML Solution, Subcutaneous) Active. Irbesartan (300MG Tablet, Oral) Active. Isosorbide Mononitrate ER (60MG Tablet ER 24HR, Oral) Active. Latanoprost (0.005% Solution, Ophthalmic) Active. Methyldopa (500MG Tablet, Oral) Active. Metoprolol Succinate ER (100MG Tablet ER 24HR, Oral) Active. Montelukast Sodium (10MG Tablet, Oral) Active. Nitrostat (0.4MG Tab Sublingual, Sublingual) Active. Pantoprazole Sodium (40MG Tablet DR, Oral) Active. Rosuvastatin Calcium (40MG Tablet, Oral) Active. Spironolactone (25MG Tablet, Oral) Active. TraMADol HCl (50MG Tablet, Oral) Active. Medications Reconciled  Social History [name], CMA; 07/15/2017 9:49 AM) Caffeine use Tea. No alcohol use No drug use Tobacco use Former smoker.  Family History [name], CMA; 07/15/2017 9:49 AM) Arthritis Mother. Bleeding disorder Family Members In General. Cerebrovascular Accident Family Members In General. Colon Polyps Family Members In General. Depression Family Members In General. Diabetes Mellitus Family Members In General. Heart Disease Family Members In General. Hypertension Family Members In General.  Pregnancy / Birth History [name], CMA; 07/15/2017 9:49 AM) Age at menarche 56 years. Contraceptive History Contraceptive implant. Gravida 2 Maternal age 15-35 Para 0  Other Problems [name], CMA; 07/15/2017 9:49 AM) Back Pain Cerebrovascular Accident Diabetes Mellitus Gastroesophageal Reflux Disease Heart murmur High blood pressure Lump In Breast Transfusion history     Review of [facility] R. [name] CMA; 07/15/2017 9:49 AM) Skin Present- Dryness. Not Present-  Change in Wart/Mole, Hives, Jaundice, New Lesions, Non-Healing Wounds, Rash and Ulcer. HEENT Present- Hearing Loss and Wears glasses/contact lenses. Not Present- Earache, Hoarseness, Nose Bleed, Oral Ulcers, Ringing in the Ears, Seasonal Allergies, Sinus Pain, Sore Throat, Visual Disturbances and Yellow Eyes. Respiratory Present- Snoring. Not Present- Bloody sputum, Chronic Cough, Difficulty Breathing and Wheezing. Cardiovascular Present- Leg Cramps and Swelling of Extremities. Not Present- Chest Pain, Difficulty Breathing Lying Down, Palpitations, Rapid Heart Rate and Shortness of Breath. Gastrointestinal Not Present- Abdominal Pain, Bloating, Bloody Stool, Change in Bowel Habits, Chronic diarrhea, Constipation, Difficulty Swallowing, Excessive gas, Gets full quickly at meals, Hemorrhoids, Indigestion, Nausea, Rectal Pain and Vomiting. Female Genitourinary Not Present- Frequency, Nocturia, Painful Urination, Pelvic Pain and Urgency. Musculoskeletal Present- Back Pain. Not Present- Joint Pain, Joint Stiffness, Muscle Pain, Muscle Weakness and Swelling of Extremities. Neurological Not Present- Decreased Memory, Fainting, Headaches, Numbness, Seizures, Tingling, Tremor, Trouble walking and Weakness. Psychiatric Present- Depression. Not Present- Anxiety, Bipolar, Change in Sleep Pattern, Fearful and Frequent crying. Endocrine Not Present- Cold Intolerance, Excessive Hunger, Hair Changes, Heat Intolerance, Hot flashes and New Diabetes. Hematology Not Present- Blood Thinners, Easy Bruising, Excessive bleeding, Gland problems, HIV and Persistent Infections.  Vitals [employer] R. [name] CMA; 07/15/2017 9:48 AM) 07/15/2017 9:48 AM Weight: 191.13 lb Height: 64in Body Surface Area: 1.92 m Body Mass Index: 32.81 kg/m  Pulse: 68 (Regular)  BP: 152/68 (Sitting, Left Arm, Standard)      Physical Exam (Karen Delsanto A. Analiah Drum MD; 07/15/2017 12:04 PM)  General Mental Status-Alert. General  Appearance-Consistent with stated age. Hydration-Well hydrated. Voice-Normal.  Head and Neck Head-normocephalic, atraumatic with no lesions or palpable masses. Trachea-midline. Thyroid Gland Characteristics - normal size and consistency.  Eye Eyeball - Bilateral-Extraocular movements intact. Sclera/Conjunctiva - Bilateral-No scleral icterus.  Chest and Lung Exam Chest and lung exam reveals -quiet, even and easy respiratory effort with no use of accessory muscles and on auscultation, normal breath sounds, no adventitious sounds and normal vocal resonance. Inspection Chest Wall - Normal. Back -  normal.  Breast Note: Left breast smaller than right breast. Scar left breast noted. Bruising left breast noted. No masses in left breast. Right breast is normal.  Cardiovascular Cardiovascular examination reveals -normal heart sounds, regular rate and rhythm with no murmurs and normal pedal pulses bilaterally.  Neurologic Neurologic evaluation reveals -alert and oriented x 3 with no impairment of recent or remote memory. Mental Status-Normal.  Lymphatic Head & Neck  General Head & Neck Lymphatics: Bilateral - Description - Normal. Axillary  General Axillary Region: Bilateral - Description - Normal. Tenderness - Non Tender.    Assessment & Plan (Karen Koskinen A. Sacora Hawbaker MD; 07/15/2017 12:04 PM)  MASS OF LEFT BREAST ON MAMMOGRAM (N63.20) Impression: discordant pathology recommend left breast seed lumpectomy of discordant area poor radiology. Risk of lumpectomy include bleeding, infection, seroma, more surgery, use of seed/wire, wound care, cosmetic deformity and the need for other treatments, death , blood clots, death. Pt agrees to proceed.  Current Plans You are being scheduled for surgery- Our schedulers will call you.  You should hear from our office's scheduling department within 5 working days about the location, date, and time of surgery. We try to make  accommodations for patient's preferences in scheduling surgery, but sometimes the OR schedule or the surgeon's schedule prevents [place] from making those accommodations.  If you have not heard from our office [phone]) in 5 working days, call the office and ask for your surgeon's nurse.  If you have other questions about your diagnosis, plan, or surgery, call the office and ask for your surgeon's nurse.  Pt Education - [facility] Breast Biopsy HCI: discussed with patient and provided information

## 2017-07-15 NOTE — H&P (View-Only) (Signed)
[name]: 07/15/2017 9:48 AM Location: [facility] Patient #: [id number] DOB: December 26, 1945 Married / Language: English / Race: Black or African American Female  History of Present Illness Marcello Moores A. Kyliah Deanda MD; 07/15/2017 12:04 PM) Patient words: discordant breast biopsy             Patient sent at the request of Dr. Owens Shark for abnormal left mammogram. The patient has a history of atypical lobular hyperplasia status post left breast lumpectomy to me in 2050. She opted out of tamoxifen prevention. She was found to have 2 areas on her recent screening mammogram in her left breast one in the anterior lower inner and the other in the upper outer quadrant left breast. Biopsy results showed fibrocystic change. The upper outer pathology and imaging were felt to be discordant. Patient denies breast pain nipple discharge or breast mass.       The pathology revealed fibrocystic change with calcifications for the posterior upper outer quadrant lesion left breast and the anterior lower-inner quadrant left breast lesion. The findings are found to be discordant per Dr. Owens Shark and excision is recommended for the posterior upper outer quadrant lesion. The findings are found to be concordant for in the anterior lower-inner quadrant left breast lesion. I discussed the results with the patient and answered her questions. The patient is doing well post biopsy without complications. The patient is informed that she has a surgical appointment with Dr. Malachi Paradise at [facility] surgery on Friday July 12, 2017 8:50 a.m. Electronically Signed By: Abelardo Diesel M.D. On: 06/28/2017 12:05 Addended by Ollen Gross, MD on 06/28/2017 12:08 PM  Study Result CLINICAL DATA: Patient presents for MR guided core biopsy of 2 areas of concern within the left breast. History of atypical ductal hyperplasia in the left breast. EXAM: MRI GUIDED CORE NEEDLE BIOPSY OF THE LEFT BREAST x2  TECHNIQUE: Multiplanar, multisequence MR imaging of the left breast was performed both before and after administration of intravenous contrast. CONTRAST: 18 cc MultiHance Creatinine was obtained on site at [place] at [address] Results: Creatinine 0.8 mg/dL. BUN 14. GFR 87. COMPARISON: 04/16/2017 FINDINGS: I met with the patient, and we discussed the procedure of MRI guided biopsy, including risks, benefits, and alternatives. Specifically, we discussed the risks of infection, bleeding, tissue injury, clip migration, and inadequate sampling. Informed, written consent was given. The usual time out protocol was performed immediately prior to the procedure. Using sterile technique, 1% Lidocaine, MRI guidance, and a 9 gauge vacuum assisted device, biopsy was performed of enhancing mass in the posterior lower outer quadrant of the left breast using a lateral approach. At the conclusion of the procedure, a bow tie shaped tissue marker clip was deployed into the biopsy cavity. Follow-up 2-view mammogram was performed and dictated separately. Using sterile technique, 1% Lidocaine, MRI guidance, and a 9 gauge vacuum assisted device, biopsy was performed of non mass enhancement in the anterior lower inner quadrant of the left breast using a lateral approach. At the conclusion of the procedure, a cylinder-shaped tissue marker clip was deployed into the biopsy cavity. Follow-up 2-view mammogram was performed and dictated separately. IMPRESSION: MRI guided biopsy of 2 areas of concern in the left breast. No apparent complications. Electronically Signed: By: Nolon Nations M.D. On: 06/27/2017 09:46      Diagnosis 1. Breast, left, needle core biopsy, posterior upper outer quadrant - FIBROCYSTIC CHANGES. - THERE IS NO EVIDENCE OF MALIGNANCY. - SEE COMMENT. 2. Breast, left, needle core biopsy, anterior  lower inner quadrant - FIBROCYSTIC CHANGES. - THERE IS NO EVIDENCE OF  MALIGNANCY. - SEE COMMENT. Microscopic Comment 1. , 2. The results were called to [facility] on 06/28/2017. (JBK:ecj 06/29/1027) Enid Cutter MD Pathologist, Electronic Signature (Case signed 06/28/2017) Specimen Gross and Clinical Information Specimen Comment 1. Formalin time: 8:35 AM; extracted < 5 min; enhancing mass bow tie clip 2. Non mass linear enhancement cylinder clip Specimen(s) Obtained: 1. Breast, left, needle core biopsy, posterior upper outer quadrant 2. Breast, left, needle core biopsy, anterior lower inner quadrant Specimen Clinical Information 1. Possible IMC 2. Possible FCC or DCIS         CLINICAL DATA: Greater than 20% calculated lifetime risk of developing breast cancer. History of atypical ductal hyperplasia of the left breast. The patient was unable to take tamoxifen.  LABS: None obtained today.  EXAM: BILATERAL BREAST MRI WITH AND WITHOUT CONTRAST  TECHNIQUE: Multiplanar, multisequence MR images of both breasts were obtained prior to and following the intravenous administration of 18 cc of MultiHance.  THREE-DIMENSIONAL MR IMAGE RENDERING ON INDEPENDENT WORKSTATION:  Three-dimensional MR images were rendered by post-processing of the original MR data on an independent workstation. The three-dimensional MR images were interpreted, and findings are reported in the following complete MRI report for this study. Three dimensional images were evaluated at the independent DynaCad workstation  COMPARISON: Previous mammograms at [facility], the most recent dated 04/01/2017.  FINDINGS: Breast composition: c. Heterogeneous fibroglandular tissue.  Background parenchymal enhancement: Moderate.  Right breast: No mass or abnormal enhancement.  Left breast: There is an irregular area of mass-like enhancement in the posterior aspect of the upper outer quadrant of the left breast in approximately the 2 o'clock position.  This measures 1.7 x 1.3 x 1.3 cm in maximum dimensions. This has predominantly rapid wash-in/washout enhancement kinetics.  More posteriorly and superiorly in the upper-outer quadrant of the left breast, there is a 2.0 x 1.5 x 0.2 cm area of non mass enhancement with rapid wash-in/washout enhancement kinetics.  Lymph nodes: No abnormal appearing lymph nodes.  Ancillary findings: None.  IMPRESSION: 1. 1.7 x 1.3 x 1.3 cm mass-like area of enhancement with MR features suspicious for malignancy in the posterior aspect of the upper-outer quadrant of the left breast. 2. 2.0 x 1.5 x 0.2 cm area of non mass enhancement with imaging features suspicious for the possibility of malignancy more posteriorly and superiorly in the upper-outer quadrant of the left breast. 3. No evidence of malignancy on the right.  RECOMMENDATION: MR guided core needle biopsy of the 1.7 cm area of mass-like enhancement in the upper-outer quadrant of the left breast and MR guided core needle biopsy of the 2.0 cm area of non mass enhancement in the upper-outer quadrant of the left breast.  BI-RADS CATEGORY 4: Suspicious.   Electronically Signed By: Claudie Revering M.D. On: 04/16/2017 15:23.  The patient is a 72 year old female.   Past Surgical History Sharyn Lull R. Brooks, CMA; 07/15/2017 9:49 AM) Breast Biopsy Left. Carotid Artery Surgery Left. Cataract Surgery Bilateral. Foot Surgery Bilateral. Hysterectomy (not due to cancer) - Partial Knee Surgery Bilateral. Spinal Surgery - Lower Back  Diagnostic Studies History Sharyn Lull R. Rolena Infante, CMA; 07/15/2017 9:49 AM) Colonoscopy within last year Mammogram within last year  Allergies Sharyn Lull R. Brooks, CMA; 07/15/2017 9:49 AM) No Known Drug Allergies [07/15/2017]:  Medication History Sharyn Lull R. Brooks, [place]; 07/15/2017 9:53 AM) ProAir HFA (108 (90 Base)MCG/ACT Aerosol Soln, Inhalation) Active. Aspirin (81MG Tablet, Oral) Active. Diclofenac  Sodium (1% Gel, External) Active. Vitamin D2 (Oral) Specific strength unknown - Active. Furosemide (40MG Tablet, Oral) Active. HydrALAZINE HCl (100MG Tablet, Oral) Active. HumaLOG KwikPen (100UNIT/ML Soln Pen-inj, Subcutaneous) Active. HumaLOG (100UNIT/ML Solution, Subcutaneous) Active. Irbesartan (300MG Tablet, Oral) Active. Isosorbide Mononitrate ER (60MG Tablet ER 24HR, Oral) Active. Latanoprost (0.005% Solution, Ophthalmic) Active. Methyldopa (500MG Tablet, Oral) Active. Metoprolol Succinate ER (100MG Tablet ER 24HR, Oral) Active. Montelukast Sodium (10MG Tablet, Oral) Active. Nitrostat (0.4MG Tab Sublingual, Sublingual) Active. Pantoprazole Sodium (40MG Tablet DR, Oral) Active. Rosuvastatin Calcium (40MG Tablet, Oral) Active. Spironolactone (25MG Tablet, Oral) Active. TraMADol HCl (50MG Tablet, Oral) Active. Medications Reconciled  Social History Sharyn Lull R. Brooks, CMA; 07/15/2017 9:49 AM) Caffeine use Tea. No alcohol use No drug use Tobacco use Former smoker.  Family History Sharyn Lull R. Rolena Infante, CMA; 07/15/2017 9:49 AM) Arthritis Mother. Bleeding disorder Family Members In General. Cerebrovascular Accident Family Members In General. Colon Polyps Family Members In General. Depression Family Members In General. Diabetes Mellitus Family Members In General. Heart Disease Family Members In General. Hypertension Family Members In General.  Pregnancy / Birth History Sharyn Lull R. Rolena Infante, CMA; 07/15/2017 9:49 AM) Age at menarche 46 years. Contraceptive History Contraceptive implant. Gravida 2 Maternal age 85-35 Para 0  Other Problems Sharyn Lull R. Brooks, CMA; 07/15/2017 9:49 AM) Back Pain Cerebrovascular Accident Diabetes Mellitus Gastroesophageal Reflux Disease Heart murmur High blood pressure Lump In Breast Transfusion history     Review of Systems [facility] R. Brooks CMA; 07/15/2017 9:49 AM) Skin Present- Dryness. Not Present-  Change in Wart/Mole, Hives, Jaundice, New Lesions, Non-Healing Wounds, Rash and Ulcer. HEENT Present- Hearing Loss and Wears glasses/contact lenses. Not Present- Earache, Hoarseness, Nose Bleed, Oral Ulcers, Ringing in the Ears, Seasonal Allergies, Sinus Pain, Sore Throat, Visual Disturbances and Yellow Eyes. Respiratory Present- Snoring. Not Present- Bloody sputum, Chronic Cough, Difficulty Breathing and Wheezing. Cardiovascular Present- Leg Cramps and Swelling of Extremities. Not Present- Chest Pain, Difficulty Breathing Lying Down, Palpitations, Rapid Heart Rate and Shortness of Breath. Gastrointestinal Not Present- Abdominal Pain, Bloating, Bloody Stool, Change in Bowel Habits, Chronic diarrhea, Constipation, Difficulty Swallowing, Excessive gas, Gets full quickly at meals, Hemorrhoids, Indigestion, Nausea, Rectal Pain and Vomiting. Female Genitourinary Not Present- Frequency, Nocturia, Painful Urination, Pelvic Pain and Urgency. Musculoskeletal Present- Back Pain. Not Present- Joint Pain, Joint Stiffness, Muscle Pain, Muscle Weakness and Swelling of Extremities. Neurological Not Present- Decreased Memory, Fainting, Headaches, Numbness, Seizures, Tingling, Tremor, Trouble walking and Weakness. Psychiatric Present- Depression. Not Present- Anxiety, Bipolar, Change in Sleep Pattern, Fearful and Frequent crying. Endocrine Not Present- Cold Intolerance, Excessive Hunger, Hair Changes, Heat Intolerance, Hot flashes and New Diabetes. Hematology Not Present- Blood Thinners, Easy Bruising, Excessive bleeding, Gland problems, HIV and Persistent Infections.  V[employer] R. Brooks CMA; 07/15/2017 9:48 AM) 07/15/2017 9:48 AM Weight: 191.13 lb Height: 64in Body Surface Area: 1.92 m Body Mass Index: 32.81 kg/m  Pulse: 68 (Regular)  BP: 152/68 (Sitting, Left Arm, Standard)      Physical Exam (Kynadee Dam A. Sun Kihn MD; 07/15/2017 12:04 PM)  General Mental Status-Alert. General  Appearance-Consistent with stated age. Hydration-Well hydrated. Voice-Normal.  Head and Neck Head-normocephalic, atraumatic with no lesions or palpable masses. Trachea-midline. Thyroid Gland Characteristics - normal size and consistency.  Eye Eyeball - Bilateral-Extraocular movements intact. Sclera/Conjunctiva - Bilateral-No scleral icterus.  Chest and Lung Exam Chest and lung exam reveals -quiet, even and easy respiratory effort with no use of accessory muscles and on auscultation, normal breath sounds, no adventitious sounds and normal vocal resonance. Inspection Chest Wall - Normal. Back -  normal.  Breast Note: Left breast smaller than right breast. Scar left breast noted. Bruising left breast noted. No masses in left breast. Right breast is normal.  Cardiovascular Cardiovascular examination reveals -normal heart sounds, regular rate and rhythm with no murmurs and normal pedal pulses bilaterally.  Neurologic Neurologic evaluation reveals -alert and oriented x 3 with no impairment of recent or remote memory. Mental Status-Normal.  Lymphatic Head & Neck  General Head & Neck Lymphatics: Bilateral - Description - Normal. Axillary  General Axillary Region: Bilateral - Description - Normal. Tenderness - Non Tender.    Assessment & Plan (Landry Kamath A. Zyon Grout MD; 07/15/2017 12:04 PM)  MASS OF LEFT BREAST ON MAMMOGRAM (N63.20) Impression: discordant pathology recommend left breast seed lumpectomy of discordant area poor radiology. Risk of lumpectomy include bleeding, infection, seroma, more surgery, use of seed/wire, wound care, cosmetic deformity and the need for other treatments, death , blood clots, death. Pt agrees to proceed.  Current Plans You are being scheduled for surgery- Our schedulers will call you.  You should hear from our office's scheduling department within 5 working days about the location, date, and time of surgery. We try to make  accommodations for patient's preferences in scheduling surgery, but sometimes the OR schedule or the surgeon's schedule prevents [place] from making those accommodations.  If you have not heard from our office [phone]) in 5 working days, call the office and ask for your surgeon's nurse.  If you have other questions about your diagnosis, plan, or surgery, call the office and ask for your surgeon's nurse.  Pt Education - [facility] Breast Biopsy HCI: discussed with patient and provided information

## 2017-07-17 ENCOUNTER — Ambulatory Visit (INDEPENDENT_AMBULATORY_CARE_PROVIDER_SITE_OTHER): Payer: Medicare Other | Admitting: Physician Assistant

## 2017-07-17 ENCOUNTER — Encounter: Payer: Self-pay | Admitting: Physician Assistant

## 2017-07-17 VITALS — BP 160/58 | HR 56 | Ht 64.5 in | Wt 191.8 lb

## 2017-07-17 DIAGNOSIS — I2510 Atherosclerotic heart disease of native coronary artery without angina pectoris: Secondary | ICD-10-CM | POA: Diagnosis not present

## 2017-07-17 DIAGNOSIS — I779 Disorder of arteries and arterioles, unspecified: Secondary | ICD-10-CM | POA: Diagnosis not present

## 2017-07-17 DIAGNOSIS — I10 Essential (primary) hypertension: Secondary | ICD-10-CM

## 2017-07-17 DIAGNOSIS — I739 Peripheral vascular disease, unspecified: Secondary | ICD-10-CM

## 2017-07-17 DIAGNOSIS — I5032 Chronic diastolic (congestive) heart failure: Secondary | ICD-10-CM

## 2017-07-17 DIAGNOSIS — E785 Hyperlipidemia, unspecified: Secondary | ICD-10-CM | POA: Diagnosis not present

## 2017-07-17 MED ORDER — ISOSORBIDE MONONITRATE ER 30 MG PO TB24: 30.0000 mg | ORAL_TABLET | Freq: Every day | ORAL | 3 refills | Status: DC

## 2017-07-17 NOTE — Patient Instructions (Signed)
Medication Instructions:  1. INCREASE IMDUR TO 90 MG DAILY; A NEW RX HAS BEEN SENT IN FOR IMDUR 30 MG TABLET; YOU WILL TAKE THE 30 MG TABLET DAILY ALONG WITH THE 60 MG TABLET DAILY TO = 90 MG DAILY  Labwork: NONE ORDERED TODAY  Testing/Procedures: NONE ORDERED TODAY  Follow-Up: Your physician wants you to follow-up in: 6 MONTHS WITH DR. Emelda Fear will receive a reminder letter in the mail two months in advance. If you don't receive a letter, please call our office to schedule the follow-up appointment.   Any Other Special Instructions Will Be Listed Below (If Applicable).     If you need a refill on your cardiac medications before your next appointment, please call your pharmacy.

## 2017-07-17 NOTE — Progress Notes (Signed)
Cardiology Office Note:    Date:  07/17/2017   ID:  [name], DOB 1946/03/08, MRN [id number]  PCP:  Karen Morale, MD  Cardiologist:  Karen Mocha, MD   Referring MD: Karen Morale, MD   Chief Complaint  Patient presents with  . Follow-up    CAD, hypertension    History of Present Illness:    [name] is a 72 y.o. female with a hx of coronary disease, diabetes, hypertension, prior TIA, carotid artery disease status post right CEA.  She initially presented in 2010 with unstable angina and underwent stenting of the RCA as well as pressure wire analysis of LAD/diagonal branches.  Cardiac catheterization in June 2016 demonstrated moderate nonobstructive disease in the proximal LAD unchanged from 2010 and patent stent in the RCA.  She has had side effects to carvedilol in the past with swelling.  She was last seen by [name] in September 2017.  She had lumbar spine surgery in April 2018.  [name] returns for follow-up.  She is here alone.  She continues to have discomfort in her back since her surgery.  She tries to walk for exercise/rehabilitation.  She denies exertional chest discomfort or shortness of breath.  She denies PND, syncope.  She has mild pedal edema without significant change.  Prior CV studies:   The following studies were reviewed today:  Carotid US 06/23 Patent R CEA; LICA 7-62  LHC 03/08/50 LM:  Ostial 30 LAD: prox 50, dist 50; D1 60 LCx:  lum irregs RCA:  prox 30, mid stent ok Med Rx  Echo 12/30/14 EF 60% to 65%. Wall motion was normal; Grade 2 diastolic dysfunction   Myoview 12/20/14 Possible small defect in the inferolateral wall. Ischemia cannot be completely ruled out but there is significant diaphragmatic and soft tissue attenuation on RAW images noted. This is a low risk study. EF55-65%  Renal Art [place] 10/29/11 Normal renal arteries bilaterally  Past Medical History:  Diagnosis Date  . Abnormal nuclear stress test 12/29/2014    . Allergy   . Anemia, unspecified   . Arthritis   . Blood transfusion without reported diagnosis    2018  . CAD (coronary artery disease)    sees. [name]. s/p cath with PCI of RCA (xience des) June 2010. Pt also with LAD and D2 dzs-NL FFR in both areas med Rx  . Carotid artery occlusion   . Cataract    sees Dr. Herbert Deaner   . Depressive disorder, not elsewhere classified   . Duodenal nodule   . Edema   . Fibromyalgia   . Gastric polyps    COLON  . GERD (gastroesophageal reflux disease)   . Glaucoma    narrow angle, sees Dr. Herbert Deaner   . Headache(784.0)   . Hearing loss    left ear  . Heart murmur    questionable  . Hemorrhoids   . HSV (herpes simplex virus) anogenital infection 05/2014  . HTN (hypertension)   . Hyperlipidemia   . Low back pain   . Obesity   . Peripheral vascular disease ([place])   . Stroke [facility])    TIA  "many yrs ago"    . TIA (transient ischemic attack)    also Hx of it.   . Type II or unspecified type diabetes mellitus without mention of complication, not stated as uncontrolled    sees Dr. Carrolyn Meiers     Past Surgical History:  Procedure Laterality Date  . ABDOMINAL  HYSTERECTOMY  age 56   TAH and USO  Leiomyomata  . ANTERIOR CERVICAL CORPECTOMY N/A 06/11/2014   Procedure: ANTERIOR CERVICAL CORPECTOMY CERVICAL SIX; WITH CERVICAL FIVE TO CERVICAL SEVEN ARTHRODESIS;  Surgeon: Ashok Pall, MD;  Location: [place] [facility] OR[facility];  Service: Neurosurgery;  Laterality: N/A;  . BACK SURGERY  2008   lumb fusion  . BREAST BIOPSY Left 01/26/2014   Procedure: EXCISION LEFT BREAST MASS;  Surgeon: Adin Hector, MD;  Location: [place];  Service: General;  Laterality: Left;  . CARDIAC CATHETERIZATION    . CARDIAC CATHETERIZATION N/A 12/29/2014   Procedure: Left Heart Cath and Coronary Angiography;  Surgeon: Karen Mocha, MD;  Location: [place] CV LAB;  Service: Cardiovascular;  Laterality: N/A;  . CAROTID ENDARTERECTOMY  march 2012   per Dr. Morton Amy  . CARPAL TUNNEL RELEASE Left 05/14/2014   Procedure: Left Carpal tunnel release;  Surgeon: Ashok Pall, MD;  Location: [place] [facility];  Service: Neurosurgery;  Laterality: Left;  Left Carpal tunnel release  . CARPAL TUNNEL RELEASE Bilateral   . CHOLECYSTECTOMY    . COLONOSCOPY  05-29-11   per Dr. Henrene Pastor, benign polyps, repeat in 5 yrs    . CORONARY STENT PLACEMENT     [name]  . DILATION AND CURETTAGE OF UTERUS    . ESOPHAGOGASTRODUODENOSCOPY  02-08-06   per Dr. Henrene Pastor, gastritis   . EYE SURGERY Left June 2016   Cataract  . GLAUCOMA SURGERY     with laser, per Dr. Henrene Pastor   . HARDWARE REMOVAL  10/30/2016   Procedure: HARDWARE REMOVAL;  Surgeon: Jessy Oto, MD;  Location: [facility];  Service: Orthopedics;;  . LUMBAR FUSION  2008  . POLYPECTOMY    . POSTERIOR LUMBAR FUSION  10/30/2016   Removal of hardware rods and pedicle screws lumbar four, Extension of fusion L4-5 to L5-S1 with reinsertion of screws at L 4, left L5-S1 transforaminal lumbar interbody fusion, Exploration  left L5 nerve root, bilateral decompressive laminectomy L3-4/notes 10/30/2016  . ULNAR NERVE TRANSPOSITION Left 05/14/2014   Procedure: Left Ulnar nerve decompression;  Surgeon: Ashok Pall, MD;  Location: [place] [facility];  Service: Neurosurgery;  Laterality: Left;  Left Ulnar nerve decompression    Current Medications: Current Meds  Medication Sig  . albuterol (PROVENTIL HFA;VENTOLIN HFA) 108 (90 Base) MCG/ACT inhaler Inhale 2 puffs into the lungs every 4 (four) hours as needed for wheezing or shortness of breath.  Marland Kitchen aspirin 81 MG tablet Take 81 mg by mouth daily.   . diclofenac sodium (VOLTAREN) 1 % GEL Apply 2 g topically 4 (four) times daily as needed (for pain).   Marland Kitchen ergocalciferol (VITAMIN D2) 50000 UNITS capsule Take 50,000 Units by mouth every Saturday. On Saturday.  . furosemide (LASIX) 40 MG tablet TAKE ONE TABLET BY MOUTH ONCE DAILY  . hydrALAZINE (APRESOLINE) 100 MG tablet TAKE ONE TABLET BY MOUTH THREE  TIMES DAILY  . HYDROcodone-acetaminophen (NORCO/VICODIN) 5-325 MG tablet Take by mouth.  . insulin aspart (NOVOLOG) 100 UNIT/ML injection Inject into the skin.  . Insulin Human (INSULIN PUMP) SOLN Inject into the skin. Uses Novolog insulin for pump  . irbesartan (AVAPRO) 300 MG tablet Take 1 tablet (300 mg total) by mouth daily. PLEASE SCHEDULE APPOINTMENT  . isosorbide mononitrate (IMDUR) 60 MG 24 hr tablet TAKE ONE TABLET BY MOUTH ONCE DAILY  . latanoprost (XALATAN) 0.005 % ophthalmic solution Place 1 drop into both eyes at bedtime.   . methyldopa (ALDOMET) 500 MG tablet Take  0.5 tablets (250 mg total) by mouth 2 (two) times daily.  . metoprolol succinate (TOPROL-XL) 100 MG 24 hr tablet TAKE ONE TABLET BY MOUTH ONCE DAILY WITH  OR  IMMEDIATELY  FOLLOWING  A  MEAL  . montelukast (SINGULAIR) 10 MG tablet Take 10 mg by mouth daily.  . nitroGLYCERIN (NITROSTAT) 0.4 MG SL tablet Place 1 tablet (0.4 mg total) under the tongue every 5 (five) minutes as needed for chest pain.  . pantoprazole (PROTONIX) 40 MG tablet Take 40 mg by mouth daily.   Marland Kitchen spironolactone (ALDACTONE) 25 MG tablet Take 1 tablet (25 mg total) by mouth daily. Please keep upcoming appt in January before anymore refills. Thank you  . traMADol (ULTRAM) 50 MG tablet Take 1 tablet (50 mg total) by mouth every 6 (six) hours as needed.  . [DISCONTINUED] isosorbide mononitrate (IMDUR) 60 MG 24 hr tablet Take 1 tablet (60 mg total) by mouth daily. PLEASE SCHEDULE AN APPOINTMENT     Allergies:   No known allergies   Social History   Tobacco Use  . Smoking status: Former Smoker    Last attempt to quit: 01/22/1967    Years since quitting: 50.5  . Smokeless tobacco: Never Used  Substance Use Topics  . Alcohol use: No    Alcohol/week: 0.0 oz  . Drug use: No     Family Hx: [name] family history includes Breast cancer (age of onset: 49) in her cousin; Breast cancer (age of onset: 47) in her maternal aunt; Breast cancer (age of  onset: 41) in her cousin; Breast cancer (age of onset: 72) in her cousin; Cancer in her brother, father, and maternal aunt; Diabetes in her brother; Esophageal cancer in her cousin; Heart disease in her brother and maternal grandfather; Hyperlipidemia in her sister; Hypertension in her brother, mother, and sister; Prostate cancer (age of onset: 47) in her cousin; Stomach cancer (age of onset: 48) in her maternal grandmother; Varicose Veins in her brother. There is no history of Coronary artery disease, Colon cancer, Colon polyps, or Rectal cancer.  ROS:   Please see the history of present illness.    ROS All other systems reviewed and are negative.   EKGs/Labs/Other Test Reviewed:    EKG:  EKG is  ordered today.  The ekg ordered today demonstrates sinus bradycardia, HR 56, normal axis, QTC 430 ms, no change from prior tracings  Recent Labs: 03/26/2017: ALT 11; BUN 17; Creatinine, Ser 0.91; Hemoglobin 12.3; Platelets 184.0; Potassium 3.8; Sodium 144; TSH 1.74   Recent Lipid Panel Lab Results  Component Value Date/Time   CHOL 102 03/26/2017 11:22 AM   TRIG 127.0 03/26/2017 11:22 AM   HDL 39.40 03/26/2017 11:22 AM   CHOLHDL 3 03/26/2017 11:22 AM   LDLCALC 37 03/26/2017 11:22 AM    Physical Exam:    VS:  BP (!) 160/58   Pulse (!) 56   Ht 5' 4.5" (1.638 m)   Wt 191 lb 12.8 oz (87 kg)   SpO2 97%   BMI 32.41 kg/m     Wt Readings from Last 3 Encounters:  07/17/17 191 lb 12.8 oz (87 kg)  06/26/17 193 lb (87.5 kg)  06/13/17 193 lb (87.5 kg)     Physical Exam  Constitutional: She is oriented to person, place, and time. She appears well-developed and well-nourished. No distress.  HENT:  Head: Normocephalic and atraumatic.  Neck: No thyromegaly present.  Cardiovascular: Normal rate and regular rhythm.  No murmur heard. Pulmonary/Chest: Effort normal. She  has no rales.  Abdominal: Soft.  Musculoskeletal: She exhibits no edema.  Neurological: She is alert and oriented to person,  place, and time.  Skin: Skin is warm and dry.    ASSESSMENT:    1. Coronary artery disease involving native coronary artery of native heart without angina pectoris   2. Chronic diastolic CHF (congestive heart failure) ([place])   3. Bilateral carotid artery disease, unspecified type ([place])   4. Essential hypertension   5. Hyperlipidemia, unspecified hyperlipidemia type    PLAN:    In order of problems listed above:  1.  Coronary artery disease  History of prior stenting to the RCA.  Cardiac catheterization in 2016 demonstrated patent stent.  She is doing well without anginal symptoms.  Continue aspirin, nitrates, beta-blocker, statin.  2.  Chronic diastolic CHF (congestive heart failure) ([facility]) History of moderate diastolic dysfunction on echocardiogram in 2016.  She is on daily Lasix.  She is NYHA 2.  No signs of volume excess on exam today.  3.  Bilateral carotid artery disease, unspecified type ([place]) She continues follow-up with Dr. Oneida Alar at vascular surgery.  4.  Essential hypertension Blood pressure remains elevated.  She remains on multidrug regimen.  She cannot tolerate amlodipine due to previous history of swelling.  Continue to monitor salt.    -Increase isosorbide to 90 mg daily.  5.  Hyperlipidemia, unspecified hyperlipidemia type  LDL optimal on most recent lab work.  Continue current Rx.     Dispo:  Return in about 6 months (around 01/14/2018) for Routine Follow Up, w/ [name].   Medication Adjustments/Labs and Tests Ordered: Current medicines are reviewed at length with the patient today.  Concerns regarding medicines are outlined above.  Tests Ordered: Orders Placed This Encounter  Procedures  . EKG 12-Lead   Medication Changes: Meds ordered this encounter  Medications  . isosorbide mononitrate (IMDUR) 30 MG 24 hr tablet    Sig: Take 1 tablet (30 mg total) by mouth daily.    Dispense:  90 tablet    Refill:  3    THIS IS TO BE TAKEN WITH THE 60 MG TABLET OF  IMDUR TO = 90 MG DAILY; PT REQUEST TO SEND IN 30 MG TABLET    Signed, Richardson Dopp, PA-C  07/17/2017 1:36 PM    [facility] [address], [place], [place]  [address] Phone: [phone]; Fax: [phone]

## 2017-07-18 ENCOUNTER — Ambulatory Visit (INDEPENDENT_AMBULATORY_CARE_PROVIDER_SITE_OTHER): Payer: Medicare Other

## 2017-07-18 ENCOUNTER — Other Ambulatory Visit: Payer: Self-pay | Admitting: Surgery

## 2017-07-18 ENCOUNTER — Ambulatory Visit (INDEPENDENT_AMBULATORY_CARE_PROVIDER_SITE_OTHER): Payer: Medicare Other | Admitting: Specialist

## 2017-07-18 ENCOUNTER — Encounter (INDEPENDENT_AMBULATORY_CARE_PROVIDER_SITE_OTHER): Payer: Self-pay | Admitting: Specialist

## 2017-07-18 VITALS — BP 169/72 | HR 55 | Ht 64.0 in | Wt 190.0 lb

## 2017-07-18 DIAGNOSIS — I2510 Atherosclerotic heart disease of native coronary artery without angina pectoris: Secondary | ICD-10-CM

## 2017-07-18 DIAGNOSIS — M7061 Trochanteric bursitis, right hip: Secondary | ICD-10-CM

## 2017-07-18 DIAGNOSIS — M5136 Other intervertebral disc degeneration, lumbar region: Secondary | ICD-10-CM | POA: Diagnosis not present

## 2017-07-18 DIAGNOSIS — N6320 Unspecified lump in the left breast, unspecified quadrant: Secondary | ICD-10-CM

## 2017-07-18 DIAGNOSIS — Z9889 Other specified postprocedural states: Secondary | ICD-10-CM | POA: Diagnosis not present

## 2017-07-18 MED ORDER — METHYLPREDNISOLONE ACETATE 40 MG/ML IJ SUSP
40.0000 mg | INTRAMUSCULAR | Status: AC | PRN
  Administered 2017-07-18: 40 mg via INTRA_ARTICULAR

## 2017-07-18 MED ORDER — NAPROXEN 500 MG PO TABS: 500.0000 mg | ORAL_TABLET | Freq: Two times a day (BID) | ORAL | 2 refills | Status: DC

## 2017-07-18 MED ORDER — BUPIVACAINE HCL 0.25 % IJ SOLN
4.0000 mL | INTRAMUSCULAR | Status: AC | PRN
Start: 2017-07-18 — End: 2017-07-18
  Administered 2017-07-18: 4 mL via INTRA_ARTICULAR

## 2017-07-18 NOTE — Patient Instructions (Signed)
Avoid frequent bending and stooping  No lifting greater than 10 lbs. May use ice or moist heat for pain. Weight loss is of benefit. Handicap license is approved.  Right hip pain is partly due to bursitis and also due to referred pain from the disc wear above the fusion site.  Medication like motrin and alleve may be of benefit.  Go to PT to have lumbar core strengthening and lower extremity strengthening and hamstring stretching, with aerobic exercise.  PT will also concentrate on exercises to decrease bursitis in the right lateral hip area.

## 2017-07-18 NOTE — Progress Notes (Signed)
Office Visit Note   Patient: [name]           Date of Birth: 09-30-1945           MRN: [id number] Visit Date: 07/18/2017              Requested by: Laurey Morale, MD [place], [place] [address] PCP: Laurey Morale, MD   Assessment & Plan: Visit Diagnoses:  1. Status post lumbar laminectomy     Plan:  Avoid frequent bending and stooping  No lifting greater than 10 lbs. May use ice or moist heat for pain. Weight loss is of benefit. Handicap license is approved.  Right hip pain is partly due to bursitis and also due to referred pain from the disc wear above the fusion site.  Medication like motrin and alleve may be of benefit.  Go to PT to have lumbar core strengthening and lower extremity strengthening and hamstring stretching, with aerobic exercise.  PT will also concentrate on exercises to decrease bursitis in the right lateral hip area. Follow-Up Instructions: No Follow-up on file.   Orders:  Orders Placed This Encounter  Procedures  . XR Lumbar Spine 2-3 Views   No orders of the defined types were placed in this encounter.     Procedures: Large Joint Inj: R greater trochanter on 07/18/2017 10:30 AM Indications: pain Details: 22 G 3.5 in needle, lateral approach  Arthrogram: No  Medications: 40 mg methylPREDNISolone acetate 40 MG/ML; 4 mL bupivacaine 0.25 % Outcome: tolerated well, no immediate complications  Band aid applied Procedure, treatment alternatives, risks and benefits explained, specific risks discussed. Immediately prior to procedure a time out was called to verify the correct patient, procedure, equipment, support staff and site/side marked as required. Patient was prepped and draped in the usual sterile fashion.       Clinical Data: No additional findings.   Subjective: Chief Complaint  Patient presents with  . Lower Back - Follow-up    72 year old female with history of low back pain pain into the right  low back and buttock area. Not as far down as previously from the waist line upwards now. Rarely into the right buttock now. She is scheduled to have a breast biopsy soon and does have 3 areas being followed with mammogram. She saw her cardiologist and was cleared, EF 60-65%    Review of Systems  Constitutional: Negative for activity change, appetite change, chills, diaphoresis, fatigue, fever and unexpected weight change.  HENT: Negative for congestion, dental problem, drooling, ear discharge, ear pain, facial swelling, sneezing, sore throat, tinnitus and trouble swallowing.   Eyes: Positive for visual disturbance. Negative for photophobia, discharge, redness and itching.  Respiratory: Negative for apnea, cough, chest tightness, shortness of breath, wheezing and stridor.   Cardiovascular: Negative for chest pain, palpitations and leg swelling.  Gastrointestinal: Negative for abdominal distention, abdominal pain, anal bleeding, diarrhea, nausea and rectal pain.  Endocrine: Negative for cold intolerance, heat intolerance, polydipsia, polyphagia and polyuria.  Genitourinary: Negative for difficulty urinating, dyspareunia, dysuria, enuresis, flank pain, frequency and hematuria.  Musculoskeletal: Positive for back pain. Negative for arthralgias, gait problem, joint swelling, myalgias, neck pain and neck stiffness.  Skin: Negative for color change, pallor, rash and wound.  Neurological: Positive for numbness. Negative for dizziness, tremors, seizures, syncope, facial asymmetry, speech difficulty, weakness, light-headedness and headaches.  Hematological: Negative for adenopathy. Does not bruise/bleed easily.  Psychiatric/Behavioral: Negative for agitation, behavioral problems, confusion, decreased concentration,  dysphoric mood, hallucinations, self-injury, sleep disturbance and suicidal ideas. The patient is not nervous/anxious and is not hyperactive.      Objective: Vital Signs: BP (!) 169/72 (BP  Location: Left Arm, Patient Position: Sitting)   Pulse (!) 55   Ht 5\' 4"  (1.626 m)   Wt 190 lb (86.2 kg)   BMI 32.61 kg/m   Physical Exam  Constitutional: She is oriented to person, place, and time. She appears well-developed and well-nourished.  HENT:  Head: Normocephalic and atraumatic.  Eyes: EOM are normal. Pupils are equal, round, and reactive to light.  Neck: Normal range of motion. Neck supple.  Pulmonary/Chest: Effort normal and breath sounds normal.  Abdominal: Soft. Bowel sounds are normal.  Neurological: She is alert and oriented to person, place, and time.  Skin: Skin is warm and dry.  Psychiatric: She has a normal mood and affect. Her behavior is normal. Judgment and thought content normal.    Right Hip Exam   Tenderness  The patient is experiencing tenderness in the greater trochanter.  Muscle Strength  Abduction: 5/5  Adduction: 5/5  Flexion: 5/5   Tests  Ober: positive  Other  Erythema: absent Scars: absent Sensation: normal Pulse: present   Back Exam   Tenderness  The patient is experiencing tenderness in the lumbar.  Range of Motion  Extension: abnormal  Flexion: normal  Lateral bend right: normal  Lateral bend left: normal  Rotation right: normal  Rotation left: normal   Muscle Strength  Right Quadriceps:  5/5  Left Quadriceps:  5/5  Right Hamstrings:  5/5  Left Hamstrings:  5/5   Tests  Straight leg raise right: negative Straight leg raise left: negative  Reflexes  Patellar: normal Achilles: normal Biceps: normal Babinski's sign: normal   Other  Toe walk: normal Heel walk: normal Sensation: normal Gait: normal  Erythema: no back redness Scars: absent      Specialty Comments:  No specialty comments available.  Imaging: No results found.   PMFS History: Patient Active Problem List   Diagnosis Date Noted  . Anemia due to blood loss 11/02/2016    Priority: High    Class: Acute  . Spondylolisthesis, lumbar  region 10/30/2016    Priority: High    Class: Chronic  . Spinal stenosis of lumbar region 10/30/2016    Priority: High    Class: Chronic  . Retained orthopedic hardware 10/30/2016    Priority: High    Class: Chronic  . Hyperlipidemia 07/17/2017  . History of lumbar spinal fusion   . History of fusion of lumbar spine   . Lethargy 10/31/2016  . AKI (acute kidney injury) ([place]) 10/31/2016  . Chronic diastolic CHF (congestive heart failure) ([place]) 10/31/2016  . Cough   . Leukocytosis   . Spinal stenosis of lumbar region with neurogenic claudication 10/30/2016  . Genetic testing 09/14/2015  . Atypical ductal hyperplasia of left breast 08/24/2015  . Family history of breast cancer   . Family history of prostate cancer   . Family history of stomach cancer   . Abnormal nuclear stress test 12/29/2014  . Cervical spondylosis with myelopathy and radiculopathy 06/11/2014  . Carpal tunnel syndrome 05/14/2014  . Aftercare following surgery of the circulatory system, [place] 03/11/2014  . Pain of right lower extremity 03/11/2014  . Atypical lobular hyperplasia of left breast 02/12/2014  . Fibromyalgia 09/21/2013  . Carotid artery disease ([place]) 02/29/2012  . Back abscess 01/22/2012  . Chest pain with moderate risk of acute coronary syndrome 12/09/2009  .  Shortness of breath 06/10/2009  . Orthostatic hypotension 01/31/2009  . CAD (coronary artery disease) 12/23/2008  . Normocytic anemia 07/21/2008  . Headache(784.0) 07/21/2008  . History of TIA (transient ischemic attack) 07/21/2008  . Edema 02/27/2008  . Diabetes mellitus with complication ([place]) 77/82/4235  . Depression 07/16/2007  . Dyslipidemia 02/21/2007  . Essential hypertension 02/21/2007  . ASTHMA 02/21/2007  . GERD 02/21/2007  . LOW BACK PAIN 02/21/2007   Past Medical History:  Diagnosis Date  . Abnormal nuclear stress test 12/29/2014  . Allergy   . Anemia, unspecified   . Arthritis   . Blood transfusion without reported  diagnosis    2018  . CAD (coronary artery disease)    sees. Dr. Burt Knack. s/p cath with PCI of RCA (xience des) June 2010. Pt also with LAD and D2 dzs-NL FFR in both areas med Rx  . Carotid artery occlusion   . Cataract    sees Dr. Herbert Deaner   . Depressive disorder, not elsewhere classified   . Duodenal nodule   . Edema   . Fibromyalgia   . Gastric polyps    COLON  . GERD (gastroesophageal reflux disease)   . Glaucoma    narrow angle, sees Dr. Herbert Deaner   . Headache(784.0)   . Hearing loss    left ear  . Heart murmur    questionable  . Hemorrhoids   . HSV (herpes simplex virus) anogenital infection 05/2014  . HTN (hypertension)   . Hyperlipidemia   . Low back pain   . Obesity   . Peripheral vascular disease ([place])   . Stroke [facility])    TIA  "many yrs ago"    . TIA (transient ischemic attack)    also Hx of it.   . Type II or unspecified type diabetes mellitus without mention of complication, not stated as uncontrolled    sees Dr. Carrolyn Meiers     Family History  Problem Relation Age of Onset  . Hypertension Mother   . Hypertension Sister   . Hyperlipidemia Sister   . Hypertension Brother   . Cancer Brother        PROSTATE/LUNG  . Diabetes Brother   . Heart disease Brother        Before age 67 - Bypass  . Varicose Veins Brother   . Cancer Father        Prostate and pancreatic   . Stomach cancer Maternal Grandmother 68  . Heart disease Maternal Grandfather   . Breast cancer Maternal Aunt 72  . Cancer Maternal Aunt        Stomach  . Breast cancer Cousin 74       maternal first cousin  . Prostate cancer Cousin 32  . Breast cancer Cousin 71  . Esophageal cancer Cousin   . Breast cancer Cousin 40       maternal first cousin's daughter  . Coronary artery disease Neg Hx        premature CAD  . Colon cancer Neg Hx   . Colon polyps Neg Hx   . Rectal cancer Neg Hx     Past Surgical History:  Procedure Laterality Date  . ABDOMINAL HYSTERECTOMY  age 72   TAH and USO   Leiomyomata  . ANTERIOR CERVICAL CORPECTOMY N/A 06/11/2014   Procedure: ANTERIOR CERVICAL CORPECTOMY CERVICAL SIX; WITH CERVICAL FIVE TO CERVICAL SEVEN ARTHRODESIS;  Surgeon: Ashok Pall, MD;  Location: [facility];  Service: Neurosurgery;  Laterality: N/A;  . BACK SURGERY  2008  lumb fusion  . BREAST BIOPSY Left 01/26/2014   Procedure: EXCISION LEFT BREAST MASS;  Surgeon: Adin Hector, MD;  Location: [facility];  Service: General;  Laterality: Left;  . CARDIAC CATHETERIZATION    . CARDIAC CATHETERIZATION N/A 12/29/2014   Procedure: Left Heart Cath and Coronary Angiography;  Surgeon: Sherren Mocha, MD;  Location: [facility];  Service: Cardiovascular;  Laterality: N/A;  . CAROTID ENDARTERECTOMY  march 2012   per Dr. Morton Amy  . CARPAL TUNNEL RELEASE Left 05/14/2014   Procedure: Left Carpal tunnel release;  Surgeon: Ashok Pall, MD;  Location: [facility];  Service: Neurosurgery;  Laterality: Left;  Left Carpal tunnel release  . CARPAL TUNNEL RELEASE Bilateral   . CHOLECYSTECTOMY    . COLONOSCOPY  05-29-11   per Dr. Henrene Pastor, benign polyps, repeat in 5 yrs    . CORONARY STENT PLACEMENT     Dr. Burt Knack  . DILATION AND CURETTAGE OF UTERUS    . ESOPHAGOGASTRODUODENOSCOPY  02-08-06   per Dr. Henrene Pastor, gastritis   . EYE SURGERY Left June 2016   Cataract  . GLAUCOMA SURGERY     with laser, per Dr. Henrene Pastor   . HARDWARE REMOVAL  10/30/2016   Procedure: HARDWARE REMOVAL;  Surgeon: Jessy Oto, MD;  Location: [facility];  Service: Orthopedics;;  . LUMBAR FUSION  2008  . POLYPECTOMY    . POSTERIOR LUMBAR FUSION  10/30/2016   Removal of hardware rods and pedicle screws lumbar four, Extension of fusion L4-5 to L5-S1 with reinsertion of screws at L 4, left L5-S1 transforaminal lumbar interbody fusion, Exploration  left L5 nerve root, bilateral decompressive laminectomy L3-4/notes 10/30/2016  . ULNAR NERVE TRANSPOSITION Left 05/14/2014   Procedure: Left Ulnar nerve  decompression;  Surgeon: Ashok Pall, MD;  Location: [facility];  Service: Neurosurgery;  Laterality: Left;  Left Ulnar nerve decompression   Social History   Occupational History  . Not on file  Tobacco Use  . Smoking status: Former Smoker    Last attempt to quit: 01/22/1967    Years since quitting: 50.5  . Smokeless tobacco: Never Used  Substance and Sexual Activity  . Alcohol use: No    Alcohol/week: 0.0 oz  . Drug use: No  . Sexual activity: No    Birth control/protection: Surgical, Post-menopausal    Comment: HYST-1st intercourse 28 yo-5 partners

## 2017-07-19 ENCOUNTER — Telehealth: Payer: Self-pay | Admitting: Physician Assistant

## 2017-07-19 ENCOUNTER — Encounter (HOSPITAL_BASED_OUTPATIENT_CLINIC_OR_DEPARTMENT_OTHER): Payer: Self-pay | Admitting: *Deleted

## 2017-07-19 ENCOUNTER — Other Ambulatory Visit: Payer: Self-pay

## 2017-07-19 NOTE — Progress Notes (Signed)
Spoke with Colletta Maryland at [employer] office stating that we will need actual cardiac clearance from Dr Burt Knack and whether it will be OK to stop her ASA as she has stents. She did see cards PA on 07-17-17 but she did not tell him she was having surgery and clearance was not addressed.

## 2017-07-19 NOTE — Telephone Encounter (Signed)
° °   [facility] Pre-operative Risk Assessment    Request for surgical clearance:  1. What type of surgery is being performed? Left Breast Lumpectomy   2. When is this surgery scheduled? 1- 15-19  3. Are there any medications that need to be held prior to surgery and how long?General Cardiac Clearance-pt was seen by Richardson Dopp on 07-17-17   4. Practice name and name of physician performing surgery? Dr Marcello Moores [facility]   5. What is your office phone and fax number? [phone] and fax is [phone]   6. Anesthesia type (None, local, MAC, general) ? General   Glyn Ade 07/19/2017, 3:54 PM  _________________________________________________________________   (provider comments below)

## 2017-07-22 NOTE — Telephone Encounter (Signed)
Follow up   Procedure schedule for 07/23/17.

## 2017-07-22 NOTE — Progress Notes (Signed)
Pt in for PAT appt, unable obtain labs will need [facility]. Ensure drink given for DOS and instructions reviewed.   Spoke with Colletta Maryland at [facility] and she is aware that CC is not yet back.

## 2017-07-22 NOTE — Telephone Encounter (Signed)
  Pt was just seen 07/17/16 and no angina and no SOB.  Her BP was elevated so imdur increased.      Primary Cardiologist: Sherren Mocha, MD  Chart reviewed as part of pre-operative protocol coverage. Given past medical history and time since last visit, based on ACC/AHA guidelines, [name] would be at acceptable risk for the planned procedure without further cardiovascular testing.   I will route this recommendation to the requesting party via Epic fax function and remove from pre-op pool.  Please call with questions.  Cecilie Kicks, NP 07/22/2017, 5:22 PM

## 2017-07-23 ENCOUNTER — Ambulatory Visit
Admission: RE | Admit: 2017-07-23 | Discharge: 2017-07-23 | Disposition: A | Discharge status: Home / Self Care | Payer: Medicare Other | Source: Ambulatory Visit | Attending: Surgery | Admitting: Surgery

## 2017-07-23 DIAGNOSIS — N6320 Unspecified lump in the left breast, unspecified quadrant: Secondary | ICD-10-CM

## 2017-07-23 DIAGNOSIS — R928 Other abnormal and inconclusive findings on diagnostic imaging of breast: Secondary | ICD-10-CM | POA: Diagnosis not present

## 2017-07-24 ENCOUNTER — Encounter (HOSPITAL_BASED_OUTPATIENT_CLINIC_OR_DEPARTMENT_OTHER)
Admission: RE | Disposition: A | Discharge status: Home / Self Care | Payer: Self-pay | Source: Ambulatory Visit | Attending: Surgery

## 2017-07-24 ENCOUNTER — Ambulatory Visit (HOSPITAL_BASED_OUTPATIENT_CLINIC_OR_DEPARTMENT_OTHER): Payer: Medicare Other | Admitting: Certified Registered"

## 2017-07-24 ENCOUNTER — Other Ambulatory Visit: Payer: Self-pay

## 2017-07-24 ENCOUNTER — Ambulatory Visit
Admission: RE | Admit: 2017-07-24 | Discharge: 2017-07-24 | Disposition: A | Discharge status: Home / Self Care | Payer: Medicare Other | Source: Ambulatory Visit | Attending: Surgery | Admitting: Surgery

## 2017-07-24 ENCOUNTER — Ambulatory Visit (HOSPITAL_BASED_OUTPATIENT_CLINIC_OR_DEPARTMENT_OTHER)
Admission: RE | Admit: 2017-07-24 | Discharge: 2017-07-24 | Disposition: A | Discharge status: Home / Self Care | Payer: Medicare Other | Source: Ambulatory Visit | Attending: Surgery | Admitting: Surgery

## 2017-07-24 ENCOUNTER — Encounter (HOSPITAL_BASED_OUTPATIENT_CLINIC_OR_DEPARTMENT_OTHER): Payer: Self-pay | Admitting: Certified Registered"

## 2017-07-24 DIAGNOSIS — Z832 Family history of diseases of the blood and blood-forming organs and certain disorders involving the immune mechanism: Secondary | ICD-10-CM | POA: Diagnosis not present

## 2017-07-24 DIAGNOSIS — Z90711 Acquired absence of uterus with remaining cervical stump: Secondary | ICD-10-CM | POA: Insufficient documentation

## 2017-07-24 DIAGNOSIS — Z8673 Personal history of transient ischemic attack (TIA), and cerebral infarction without residual deficits: Secondary | ICD-10-CM | POA: Insufficient documentation

## 2017-07-24 DIAGNOSIS — N6320 Unspecified lump in the left breast, unspecified quadrant: Secondary | ICD-10-CM

## 2017-07-24 DIAGNOSIS — Z87891 Personal history of nicotine dependence: Secondary | ICD-10-CM | POA: Insufficient documentation

## 2017-07-24 DIAGNOSIS — I10 Essential (primary) hypertension: Secondary | ICD-10-CM | POA: Diagnosis not present

## 2017-07-24 DIAGNOSIS — Z823 Family history of stroke: Secondary | ICD-10-CM | POA: Insufficient documentation

## 2017-07-24 DIAGNOSIS — Z794 Long term (current) use of insulin: Secondary | ICD-10-CM | POA: Insufficient documentation

## 2017-07-24 DIAGNOSIS — N6012 Diffuse cystic mastopathy of left breast: Secondary | ICD-10-CM | POA: Insufficient documentation

## 2017-07-24 DIAGNOSIS — Z8261 Family history of arthritis: Secondary | ICD-10-CM | POA: Diagnosis not present

## 2017-07-24 DIAGNOSIS — Z9842 Cataract extraction status, left eye: Secondary | ICD-10-CM | POA: Diagnosis not present

## 2017-07-24 DIAGNOSIS — Z818 Family history of other mental and behavioral disorders: Secondary | ICD-10-CM | POA: Insufficient documentation

## 2017-07-24 DIAGNOSIS — N6324 Unspecified lump in the left breast, lower inner quadrant: Secondary | ICD-10-CM | POA: Diagnosis not present

## 2017-07-24 DIAGNOSIS — E119 Type 2 diabetes mellitus without complications: Secondary | ICD-10-CM | POA: Diagnosis not present

## 2017-07-24 DIAGNOSIS — K219 Gastro-esophageal reflux disease without esophagitis: Secondary | ICD-10-CM | POA: Insufficient documentation

## 2017-07-24 DIAGNOSIS — I2510 Atherosclerotic heart disease of native coronary artery without angina pectoris: Secondary | ICD-10-CM | POA: Insufficient documentation

## 2017-07-24 DIAGNOSIS — Z833 Family history of diabetes mellitus: Secondary | ICD-10-CM | POA: Insufficient documentation

## 2017-07-24 DIAGNOSIS — Z791 Long term (current) use of non-steroidal anti-inflammatories (NSAID): Secondary | ICD-10-CM | POA: Diagnosis not present

## 2017-07-24 DIAGNOSIS — N6082 Other benign mammary dysplasias of left breast: Secondary | ICD-10-CM | POA: Diagnosis not present

## 2017-07-24 DIAGNOSIS — Z9889 Other specified postprocedural states: Secondary | ICD-10-CM | POA: Insufficient documentation

## 2017-07-24 DIAGNOSIS — Z7951 Long term (current) use of inhaled steroids: Secondary | ICD-10-CM | POA: Diagnosis not present

## 2017-07-24 DIAGNOSIS — Z9841 Cataract extraction status, right eye: Secondary | ICD-10-CM | POA: Diagnosis not present

## 2017-07-24 DIAGNOSIS — Z8249 Family history of ischemic heart disease and other diseases of the circulatory system: Secondary | ICD-10-CM | POA: Insufficient documentation

## 2017-07-24 DIAGNOSIS — Z7982 Long term (current) use of aspirin: Secondary | ICD-10-CM | POA: Diagnosis not present

## 2017-07-24 DIAGNOSIS — Z79899 Other long term (current) drug therapy: Secondary | ICD-10-CM | POA: Diagnosis not present

## 2017-07-24 DIAGNOSIS — Z8371 Family history of colonic polyps: Secondary | ICD-10-CM | POA: Diagnosis not present

## 2017-07-24 DIAGNOSIS — I110 Hypertensive heart disease with heart failure: Secondary | ICD-10-CM | POA: Diagnosis not present

## 2017-07-24 DIAGNOSIS — N6321 Unspecified lump in the left breast, upper outer quadrant: Secondary | ICD-10-CM | POA: Insufficient documentation

## 2017-07-24 DIAGNOSIS — I5032 Chronic diastolic (congestive) heart failure: Secondary | ICD-10-CM | POA: Diagnosis not present

## 2017-07-24 DIAGNOSIS — R921 Mammographic calcification found on diagnostic imaging of breast: Secondary | ICD-10-CM | POA: Diagnosis not present

## 2017-07-24 HISTORY — PX: BREAST LUMPECTOMY WITH RADIOACTIVE SEED LOCALIZATION: SHX6424

## 2017-07-24 LAB — GLUCOSE, CAPILLARY
GLUCOSE-CAPILLARY: 189 mg/dL — AB (ref 65–99)
Glucose-Capillary: 151 mg/dL — ABNORMAL HIGH (ref 65–99)

## 2017-07-24 SURGERY — BREAST LUMPECTOMY WITH RADIOACTIVE SEED LOCALIZATION
Anesthesia: General | Site: Breast | Laterality: Left

## 2017-07-24 MED ORDER — GABAPENTIN 300 MG PO CAPS
ORAL_CAPSULE | ORAL | Status: AC
  Filled 2017-07-24: qty 1

## 2017-07-24 MED ORDER — ONDANSETRON HCL 4 MG/2ML IJ SOLN: 4.0000 mg | Freq: Once | INTRAMUSCULAR | Status: DC | PRN

## 2017-07-24 MED ORDER — BUPIVACAINE-EPINEPHRINE (PF) 0.25% -1:200000 IJ SOLN
INTRAMUSCULAR | Status: DC | PRN
  Administered 2017-07-24: 9 mL

## 2017-07-24 MED ORDER — MEPERIDINE HCL 25 MG/ML IJ SOLN: 6.2500 mg | INTRAMUSCULAR | Status: DC | PRN

## 2017-07-24 MED ORDER — BUPIVACAINE-EPINEPHRINE 0.25% -1:200000 IJ SOLN
INTRAMUSCULAR | Status: AC
  Filled 2017-07-24: qty 2

## 2017-07-24 MED ORDER — GABAPENTIN 300 MG PO CAPS
300.0000 mg | ORAL_CAPSULE | ORAL | Status: AC
  Administered 2017-07-24: 300 mg via ORAL

## 2017-07-24 MED ORDER — BUPIVACAINE HCL (PF) 0.25 % IJ SOLN
INTRAMUSCULAR | Status: AC
  Filled 2017-07-24: qty 60

## 2017-07-24 MED ORDER — CHLORHEXIDINE GLUCONATE CLOTH 2 % EX PADS: 6.0000 | MEDICATED_PAD | Freq: Once | CUTANEOUS | Status: DC

## 2017-07-24 MED ORDER — DEXTROSE 5 % IV SOLN: 3.0000 g | INTRAVENOUS | Status: DC

## 2017-07-24 MED ORDER — SCOPOLAMINE 1 MG/3DAYS TD PT72: 1.0000 | MEDICATED_PATCH | Freq: Once | TRANSDERMAL | Status: DC | PRN

## 2017-07-24 MED ORDER — LACTATED RINGERS IV SOLN
INTRAVENOUS | Status: DC
  Administered 2017-07-24: 08:00:00 via INTRAVENOUS

## 2017-07-24 MED ORDER — HYDROCODONE-ACETAMINOPHEN 5-325 MG PO TABS: 1.0000 | ORAL_TABLET | Freq: Four times a day (QID) | ORAL | 0 refills | Status: DC | PRN

## 2017-07-24 MED ORDER — FENTANYL CITRATE (PF) 100 MCG/2ML IJ SOLN
50.0000 ug | INTRAMUSCULAR | Status: DC | PRN
  Administered 2017-07-24: 25 ug via INTRAVENOUS
  Administered 2017-07-24: 50 ug via INTRAVENOUS

## 2017-07-24 MED ORDER — FENTANYL CITRATE (PF) 100 MCG/2ML IJ SOLN
INTRAMUSCULAR | Status: AC
  Filled 2017-07-24: qty 2

## 2017-07-24 MED ORDER — ACETAMINOPHEN 500 MG PO TABS
ORAL_TABLET | ORAL | Status: AC
  Filled 2017-07-24: qty 2

## 2017-07-24 MED ORDER — EPHEDRINE SULFATE 50 MG/ML IJ SOLN
INTRAMUSCULAR | Status: DC | PRN
  Administered 2017-07-24: 10 mg via INTRAVENOUS

## 2017-07-24 MED ORDER — HYDROMORPHONE HCL 1 MG/ML IJ SOLN: 0.2500 mg | INTRAMUSCULAR | Status: DC | PRN

## 2017-07-24 MED ORDER — CEFAZOLIN SODIUM-DEXTROSE 2-4 GM/100ML-% IV SOLN
INTRAVENOUS | Status: AC
  Filled 2017-07-24: qty 100

## 2017-07-24 MED ORDER — ACETAMINOPHEN 500 MG PO TABS
1000.0000 mg | ORAL_TABLET | ORAL | Status: AC
  Administered 2017-07-24: 1000 mg via ORAL

## 2017-07-24 MED ORDER — MIDAZOLAM HCL 2 MG/2ML IJ SOLN: 1.0000 mg | INTRAMUSCULAR | Status: DC | PRN

## 2017-07-24 MED ORDER — LIDOCAINE HCL (CARDIAC) 20 MG/ML IV SOLN
INTRAVENOUS | Status: DC | PRN
  Administered 2017-07-24: 60 mg via INTRAVENOUS

## 2017-07-24 MED ORDER — ACETAMINOPHEN 500 MG PO TABS
ORAL_TABLET | ORAL | Status: AC
  Filled 2017-07-24: qty 1

## 2017-07-24 MED ORDER — CEFAZOLIN SODIUM-DEXTROSE 2-4 GM/100ML-% IV SOLN
2.0000 g | Freq: Once | INTRAVENOUS | Status: AC
  Administered 2017-07-24: 2 g via INTRAVENOUS

## 2017-07-24 SURGICAL SUPPLY — 41 items
APPLIER CLIP 9.375 MED OPEN (MISCELLANEOUS) ×3
BINDER BREAST XLRG (GAUZE/BANDAGES/DRESSINGS) ×3 IMPLANT
BLADE SURG 15 STRL LF DISP TIS (BLADE) ×1 IMPLANT
BLADE SURG 15 STRL SS (BLADE) ×2
CANISTER SUCT 1200ML W/VALVE (MISCELLANEOUS) ×3 IMPLANT
CHLORAPREP W/TINT 26ML (MISCELLANEOUS) ×3 IMPLANT
CLIP APPLIE 9.375 MED OPEN (MISCELLANEOUS) ×1 IMPLANT
COVER BACK TABLE 60X90IN (DRAPES) ×3 IMPLANT
COVER MAYO STAND STRL (DRAPES) ×3 IMPLANT
COVER PROBE W GEL 5X96 (DRAPES) ×3 IMPLANT
DERMABOND ADVANCED (GAUZE/BANDAGES/DRESSINGS) ×2
DERMABOND ADVANCED .7 DNX12 (GAUZE/BANDAGES/DRESSINGS) ×1 IMPLANT
DEVICE DUBIN W/COMP PLATE 8390 (MISCELLANEOUS) ×3 IMPLANT
DRAPE LAPAROTOMY 100X72 PEDS (DRAPES) ×3 IMPLANT
DRAPE UTILITY XL STRL (DRAPES) ×3 IMPLANT
ELECT COATED BLADE 2.86 ST (ELECTRODE) ×3 IMPLANT
ELECT REM PT RETURN 9FT ADLT (ELECTROSURGICAL) ×3
ELECTRODE REM PT RTRN 9FT ADLT (ELECTROSURGICAL) ×1 IMPLANT
GLOVE BIOGEL PI IND STRL 7.0 (GLOVE) ×2 IMPLANT
GLOVE BIOGEL PI IND STRL 8 (GLOVE) ×1 IMPLANT
GLOVE BIOGEL PI INDICATOR 7.0 (GLOVE) ×4
GLOVE BIOGEL PI INDICATOR 8 (GLOVE) ×2
GLOVE ECLIPSE 6.5 STRL STRAW (GLOVE) ×3 IMPLANT
GLOVE ECLIPSE 8.0 STRL XLNG CF (GLOVE) ×3 IMPLANT
GOWN STRL REUS W/ TWL LRG LVL3 (GOWN DISPOSABLE) ×2 IMPLANT
GOWN STRL REUS W/TWL LRG LVL3 (GOWN DISPOSABLE) ×4
KIT MARKER MARGIN INK (KITS) ×3 IMPLANT
NEEDLE HYPO 25X1 1.5 SAFETY (NEEDLE) ×3 IMPLANT
NS IRRIG 1000ML POUR BTL (IV SOLUTION) ×3 IMPLANT
PACK BASIN DAY SURGERY FS (CUSTOM PROCEDURE TRAY) ×3 IMPLANT
PENCIL BUTTON HOLSTER BLD 10FT (ELECTRODE) ×3 IMPLANT
SLEEVE SCD COMPRESS KNEE MED (MISCELLANEOUS) ×3 IMPLANT
SPONGE LAP 4X18 X RAY DECT (DISPOSABLE) ×3 IMPLANT
SUT MNCRL AB 4-0 PS2 18 (SUTURE) ×3 IMPLANT
SUT VICRYL 3-0 CR8 SH (SUTURE) ×3 IMPLANT
SYR CONTROL 10ML LL (SYRINGE) ×3 IMPLANT
TOWEL OR 17X24 6PK STRL BLUE (TOWEL DISPOSABLE) ×3 IMPLANT
TOWEL OR NON WOVEN STRL DISP B (DISPOSABLE) ×3 IMPLANT
TUBE CONNECTING 20'X1/4 (TUBING) ×1
TUBE CONNECTING 20X1/4 (TUBING) ×2 IMPLANT
YANKAUER SUCT BULB TIP NO VENT (SUCTIONS) ×3 IMPLANT

## 2017-07-24 NOTE — Op Note (Signed)
Preoperative diagnosis: Left breast mass  Postoperative diagnosis: Same  Procedure: Left breast seed localized lumpectomy.  Surgeon: Erroll Luna MD  Anesthesia: LMA with local  EBL: 20 cc  Specimen: Left breast tissue with seed and clip  Drains: None  IV fluids: Per anesthesia record  Indications for procedure: The patient is a 72 year old female with a history of atypical ductal hyperplasia.  Recent mammogram revealed a density in her left breast and core biopsy revealed this to be potential scar tissue versus further atypical ductal hyperplasia.  Excision was recommended to exclude malignancy.  Risk, benefits and other options were discussed with the patient.  The rationale for surgery was discussed with the patient.  The possibility of malignancy was discussed with the patient.The procedure has been discussed with the patient. Alternatives to surgery have been discussed with the patient.  Risks of surgery include bleeding,  Infection,  Seroma formation, death,  and the need for further surgery.   The patient understands and wishes to proceed.  Description of procedure: The patient was met in the holding area.  Questions were answered.  The left breast was marked as the correct side and her brace that was on her.  She was taken back to the operating room.  She was placed supine on the operating room table in general anesthesia initiated.  Left breast was prepped and draped in a sterile fashion.  Timeout was done.  She received preoperative antibiotics.  Neoprobe was used to locate the seed.  Her films were available for review and I only saw one seed in the left breast.  The neoprobe only found one seed.  There were 2 clips in the breast from a previous biopsy was noted.  Neoprobe was used to localize the spot.  Curvilinear incision made in the left breast upper outer quadrant.  Dissection was carried down and all tissue around the seed and clip were excised.  Of note the seed became  separated from the tissue was sent separately.  Tissue was then examined under x-ray evaluation.  The tissue showed the clip to be in place.  Gross margins were negative.  The tissue was then marked with the assistance of the radiologist.  The seed was sent separately.  The cavity was made hemostatic.  I placed 4 small clips to mark this biopsy site.  I closed with 3-0 Vicryl and 4-0 Monocryl.  Dermabond applied.  All final counts found to be correct of sponge instruments and needles.  The patient was awoke extubated taken to recovery in satisfactory condition.

## 2017-07-24 NOTE — Interval H&P Note (Signed)
History and Physical Interval Note:  07/24/2017 8:09 AM  [name]  has presented today for surgery, with the diagnosis of left breast mass  The various methods of treatment have been discussed with the patient and family. After consideration of risks, benefits and other options for treatment, the patient has consented to  Procedure(s): LEFT BREAST LUMPECTOMY WITH RADIOACTIVE SEED LOCALIZATION ERAS (Left) as a surgical intervention .  [name] history has been reviewed, patient examined, no change in status, stable for surgery.  I have reviewed the patient's chart and labs.  Questions were answered to the patient's satisfaction.     [place]

## 2017-07-24 NOTE — Transfer of Care (Signed)
Immediate Anesthesia Transfer of Care Note  Patient: [name]  Procedure(s) Performed: LEFT BREAST LUMPECTOMY WITH RADIOACTIVE SEED LOCALIZATION ERAS (Left Breast)  Patient Location: PACU  Anesthesia Type:General  Level of Consciousness: awake and patient cooperative  Airway & Oxygen Therapy: Patient Spontanous Breathing and Patient connected to face mask oxygen  Post-op Assessment: Report given to RN and Post -op Vital signs reviewed and stable  Post vital signs: Reviewed and stable  Last Vitals:  Vitals:   07/24/17 0713 07/24/17 0927  BP: (!) 140/49 (!) 140/58  Pulse: (!) 57   Resp: 16   Temp: 36.7 C   SpO2: 100%     Last Pain:  Vitals:   07/24/17 0713  TempSrc: Oral      Patients Stated Pain Goal: 0 (41/99/14 4458)  Complications: No apparent anesthesia complications

## 2017-07-24 NOTE — Anesthesia Postprocedure Evaluation (Signed)
Anesthesia Post Note  Patient: [name]  Procedure(s) Performed: LEFT BREAST LUMPECTOMY WITH RADIOACTIVE SEED LOCALIZATION ERAS (Left Breast)     Patient location during evaluation: PACU Anesthesia Type: General Level of consciousness: awake and alert Pain management: pain level controlled Vital Signs Assessment: post-procedure vital signs reviewed and stable Respiratory status: spontaneous breathing, nonlabored ventilation, respiratory function stable and patient connected to nasal cannula oxygen Cardiovascular status: blood pressure returned to baseline and stable Postop Assessment: no apparent nausea or vomiting Anesthetic complications: no    Last Vitals:  Vitals:   07/24/17 1015 07/24/17 1036  BP: (!) 183/75 (!) 187/79  Pulse: (!) 58 (!) 56  Resp: 12 14  Temp:  36.4 C  SpO2: 100% 97%    Last Pain:  Vitals:   07/24/17 1036  TempSrc: Oral  PainSc: 0-No pain                 Aleynah Rocchio DAVID

## 2017-07-24 NOTE — Discharge Instructions (Signed)
[facility] °Office Phone Number [phone] ° °BREAST BIOPSY/ PARTIAL MASTECTOMY: POST OP INSTRUCTIONS ° °Always review your discharge instruction sheet given to you by the facility where your surgery was performed. ° °IF YOU HAVE DISABILITY OR FAMILY LEAVE FORMS, YOU MUST BRING THEM TO THE OFFICE FOR PROCESSING.  DO NOT GIVE THEM TO YOUR DOCTOR. ° °1. A prescription for pain medication may be given to you upon discharge.  Take your pain medication as prescribed, if needed.  If narcotic pain medicine is not needed, then you may take acetaminophen (Tylenol) or ibuprofen (Advil) as needed. °2. Take your usually prescribed medications unless otherwise directed °3. If you need a refill on your pain medication, please contact your pharmacy.  They will contact our office to request authorization.  Prescriptions will not be filled after 5pm or on week-ends. °4. You should eat very light the first 24 hours after surgery, such as soup, crackers, pudding, etc.  Resume your normal diet the day after surgery. °5. Most patients will experience some swelling and bruising in the breast.  Ice packs and a good support bra will help.  Swelling and bruising can take several days to resolve.  °6. It is common to experience some constipation if taking pain medication after surgery.  Increasing fluid intake and taking a stool softener will usually help or prevent this problem from occurring.  A mild laxative (Milk of Magnesia or Miralax) should be taken according to package directions if there are no bowel movements after 48 hours. °7. Unless discharge instructions indicate otherwise, you may remove your bandages 24-48 hours after surgery, and you may shower at that time.  You may have steri-strips (small skin tapes) in place directly over the incision.  These strips should be left on the skin for 7-10 days.  If your surgeon used skin glue on the incision, you may shower in 24 hours.  The glue will flake off over the  next 2-3 weeks.  Any sutures or staples will be removed at the office during your follow-up visit. °8. ACTIVITIES:  You may resume regular daily activities (gradually increasing) beginning the next day.  Wearing a good support bra or sports bra minimizes pain and swelling.  You may have sexual intercourse when it is comfortable. °a. You may drive when you no longer are taking prescription pain medication, you can comfortably wear a seatbelt, and you can safely maneuver your car and apply brakes. °b. RETURN TO WORK:  ______________________________________________________________________________________ °9. You should see your doctor in the office for a follow-up appointment approximately two weeks after your surgery.  Your doctor’s nurse will typically make your follow-up appointment when she calls you with your pathology report.  Expect your pathology report 2-3 business days after your surgery.  You may call to check if you do not hear from us after three days. °10. OTHER INSTRUCTIONS: _______________________________________________________________________________________________ _____________________________________________________________________________________________________________________________________ °_____________________________________________________________________________________________________________________________________ °_____________________________________________________________________________________________________________________________________ ° °WHEN TO CALL YOUR DOCTOR: °1. Fever over 101.0 °2. Nausea and/or vomiting. °3. Extreme swelling or bruising. °4. Continued bleeding from incision. °5. Increased pain, redness, or drainage from the incision. ° °The clinic staff is available to answer your questions during regular business hours.  Please don’t hesitate to call and ask to speak to one of the nurses for clinical concerns.  If you have a medical emergency, go to the nearest  emergency room or call 911.  A surgeon from [facility] is always on call at the hospital. ° °For further questions, please visit [email or web address]  ° ° ° ° °  Post Anesthesia Home Care Instructions ° °Activity: °Get plenty of rest for the remainder of the day. A responsible individual must stay with you for 24 hours following the procedure.  °For the next 24 hours, DO NOT: °-Drive a car °-Operate machinery °-Drink alcoholic beverages °-Take any medication unless instructed by your physician °-Make any legal decisions or sign important papers. ° °Meals: °Start with liquid foods such as gelatin or soup. Progress to regular foods as tolerated. Avoid greasy, spicy, heavy foods. If nausea and/or vomiting occur, drink only clear liquids until the nausea and/or vomiting subsides. Call your physician if vomiting continues. ° °Special Instructions/Symptoms: °Your throat may feel dry or sore from the anesthesia or the breathing tube placed in your throat during surgery. If this causes discomfort, gargle with warm salt water. The discomfort should disappear within 24 hours. ° °If you had a scopolamine patch placed behind your ear for the management of post- operative nausea and/or vomiting: ° °1. The medication in the patch is effective for 72 hours, after which it should be removed.  Wrap patch in a tissue and discard in the trash. Wash hands thoroughly with soap and water. °2. You may remove the patch earlier than 72 hours if you experience unpleasant side effects which may include dry mouth, dizziness or visual disturbances. °3. Avoid touching the patch. Wash your hands with soap and water after contact with the patch. °  ° °

## 2017-07-24 NOTE — Anesthesia Preprocedure Evaluation (Signed)
Anesthesia Evaluation  Patient identified by MRN, date of birth, ID band Patient awake    Reviewed: Allergy & Precautions, NPO status , Patient's Chart, lab work & pertinent test results  Airway Mallampati: II  TM Distance: >3 FB Neck ROM: Full    Dental   Pulmonary former smoker,    Pulmonary exam normal        Cardiovascular hypertension, Pt. on medications + CAD  Normal cardiovascular exam     Neuro/Psych    GI/Hepatic   Endo/Other  diabetes, Type 2, Oral Hypoglycemic Agents  Renal/GU      Musculoskeletal   Abdominal   Peds  Hematology   Anesthesia Other Findings   Reproductive/Obstetrics                             Anesthesia Physical Anesthesia Plan  ASA: III  Anesthesia Plan: General   Post-op Pain Management:    Induction: Intravenous  PONV Risk Score and Plan: 3 and Ondansetron and Dexamethasone  Airway Management Planned: LMA  Additional Equipment:   Intra-op Plan:   Post-operative Plan: Extubation in OR  Informed Consent: I have reviewed the patients History and Physical, chart, labs and discussed the procedure including the risks, benefits and alternatives for the proposed anesthesia with the patient or authorized representative who has indicated his/her understanding and acceptance.     Plan Discussed with: CRNA and Surgeon  Anesthesia Plan Comments:         Anesthesia Quick Evaluation

## 2017-07-24 NOTE — Anesthesia Procedure Notes (Signed)
Procedure Name: LMA Insertion Date/Time: 07/24/2017 8:32 AM Performed by: Signe Colt, CRNA Pre-anesthesia Checklist: Patient identified, Emergency Drugs available, Suction available and Patient being monitored Patient Re-evaluated:Patient Re-evaluated prior to induction Oxygen Delivery Method: Circle system utilized Preoxygenation: Pre-oxygenation with 100% oxygen Induction Type: IV induction Ventilation: Mask ventilation without difficulty LMA: LMA inserted LMA Size: 4.0 Number of attempts: 1 Airway Equipment and Method: Bite block Placement Confirmation: positive ETCO2 Tube secured with: Tape Dental Injury: Teeth and Oropharynx as per pre-operative assessment

## 2017-07-25 ENCOUNTER — Encounter (HOSPITAL_BASED_OUTPATIENT_CLINIC_OR_DEPARTMENT_OTHER): Payer: Self-pay | Admitting: Surgery

## 2017-07-31 DIAGNOSIS — L720 Epidermal cyst: Secondary | ICD-10-CM | POA: Diagnosis not present

## 2017-08-13 ENCOUNTER — Telehealth: Payer: Self-pay | Admitting: *Deleted

## 2017-08-13 NOTE — Telephone Encounter (Signed)
"  I'm calling to speak directly with Dr. Burr Medico.  I do not want to leave a message for the nurse.  The nurse cannot help me.  I was told to call her after the procedure she recommended.  I'll call back next Monday."  Routing call information to collaborative nurse and provider for review.  Further patient communication through collaborative nurse.

## 2017-08-18 ENCOUNTER — Other Ambulatory Visit: Payer: Self-pay | Admitting: Cardiovascular Disease

## 2017-08-20 NOTE — Telephone Encounter (Signed)
I called pt back and left a message, to encourage her to call us back with her concerns, and I will call her back. Thanks.  Truitt Merle  08/20/2017

## 2017-09-17 ENCOUNTER — Ambulatory Visit: Payer: Medicare Other | Admitting: Family

## 2017-09-17 ENCOUNTER — Encounter (HOSPITAL_COMMUNITY): Payer: Medicare Other

## 2017-09-19 ENCOUNTER — Other Ambulatory Visit: Payer: Self-pay | Admitting: Physician Assistant

## 2017-09-19 ENCOUNTER — Other Ambulatory Visit: Payer: Self-pay | Admitting: Cardiovascular Disease

## 2017-09-19 DIAGNOSIS — I10 Essential (primary) hypertension: Secondary | ICD-10-CM

## 2017-09-23 ENCOUNTER — Other Ambulatory Visit: Payer: Self-pay | Admitting: Cardiovascular Disease

## 2017-09-24 ENCOUNTER — Telehealth: Payer: Self-pay | Admitting: *Deleted

## 2017-09-24 NOTE — Telephone Encounter (Signed)
Received fax from [facility] indicating that methyldopa 250 mg is on backorder. They are requesting that the patient be prescribed another medication. Thanks, MI

## 2017-09-24 NOTE — Telephone Encounter (Signed)
There's not really an alternative for methyldopa. She'll just have to keep an eye on her BP on current medicines. thanks

## 2017-09-25 NOTE — Telephone Encounter (Signed)
Spoke with pharmacy at [facility].  They are out of stock of methyldopa 250 mg and 500 mg (these are the only mg available).   Called patient to see if she would like to try another pharmacy or if she wants to stop the medication and monitor.  Left message to call back.

## 2017-09-26 NOTE — Telephone Encounter (Signed)
Spoke with patient. Offered to call in medication to another pharmacy but she would rather try stopping the medication at this time.  She will monitor her BP at home and call if it starts to increase. She was grateful for call and agrees with treatment plan.

## 2017-10-02 ENCOUNTER — Ambulatory Visit: Payer: Medicare Other | Attending: Specialist

## 2017-10-02 ENCOUNTER — Other Ambulatory Visit: Payer: Self-pay

## 2017-10-02 DIAGNOSIS — R293 Abnormal posture: Secondary | ICD-10-CM | POA: Insufficient documentation

## 2017-10-02 DIAGNOSIS — M25551 Pain in right hip: Secondary | ICD-10-CM | POA: Insufficient documentation

## 2017-10-02 DIAGNOSIS — M545 Low back pain: Secondary | ICD-10-CM | POA: Diagnosis not present

## 2017-10-02 DIAGNOSIS — M6281 Muscle weakness (generalized): Secondary | ICD-10-CM | POA: Diagnosis not present

## 2017-10-02 DIAGNOSIS — G8929 Other chronic pain: Secondary | ICD-10-CM | POA: Insufficient documentation

## 2017-10-02 DIAGNOSIS — M62830 Muscle spasm of back: Secondary | ICD-10-CM | POA: Insufficient documentation

## 2017-10-02 NOTE — Therapy (Signed)
[facility] [place], [name], [address] Phone: [phone]   Fax:  [phone]  Physical Therapy Evaluation  Patient Details  Name: [name] MRN: [id number] Date of Birth: 1945-11-12 Referring Provider: Basil Dess, MD   Encounter Date: 10/02/2017  PT End of Session - 10/02/17 1016    Visit Number  1    Number of Visits  17    Date for PT Re-Evaluation  11/29/17    Authorization Type  MCR    PT Start Time  0945 Pt late for 9:30 appointment    PT Stop Time  1014    PT Time Calculation (min)  29 min    Activity Tolerance  Patient tolerated treatment well;Patient limited by pain    Behavior During Therapy  [facility] for tasks assessed/performed       Past Medical History:  Diagnosis Date  . Abnormal nuclear stress test 12/29/2014  . Allergy   . Anemia, unspecified   . Arthritis   . Blood transfusion without reported diagnosis    2018  . CAD (coronary artery disease)    sees. Dr. Burt Knack. s/p cath with PCI of RCA (xience des) June 2010. Pt also with LAD and D2 dzs-NL FFR in both areas med Rx  . Carotid artery occlusion   . Cataract    sees Dr. Herbert Deaner   . Depressive disorder, not elsewhere classified   . Duodenal nodule   . Edema   . Fibromyalgia   . Gastric polyps    COLON  . GERD (gastroesophageal reflux disease)   . Glaucoma    narrow angle, sees Dr. Herbert Deaner   . Headache(784.0)   . Hearing loss    left ear  . Heart murmur    questionable  . Hemorrhoids   . HSV (herpes simplex virus) anogenital infection 05/2014  . HTN (hypertension)   . Hyperlipidemia   . Low back pain   . Obesity   . Peripheral vascular disease ([place])   . Stroke [facility])    TIA  "many yrs ago"    . TIA (transient ischemic attack)    also Hx of it.   . Type II or unspecified type diabetes mellitus without mention of complication, not stated as uncontrolled    sees Dr. Carrolyn Meiers     Past Surgical History:  Procedure  Laterality Date  . ABDOMINAL HYSTERECTOMY  age 39   TAH and USO  Leiomyomata  . ANTERIOR CERVICAL CORPECTOMY N/A 06/11/2014   Procedure: ANTERIOR CERVICAL CORPECTOMY CERVICAL SIX; WITH CERVICAL FIVE TO CERVICAL SEVEN ARTHRODESIS;  Surgeon: Ashok Pall, MD;  Location: [place] [facility];  Service: Neurosurgery;  Laterality: N/A;  . BACK SURGERY  2008   lumb fusion  . BREAST BIOPSY Left 01/26/2014   Procedure: EXCISION LEFT BREAST MASS;  Surgeon: Adin Hector, MD;  Location: [facility];  Service: General;  Laterality: Left;  . BREAST LUMPECTOMY WITH RADIOACTIVE SEED LOCALIZATION Left 07/24/2017   Procedure: LEFT BREAST LUMPECTOMY WITH RADIOACTIVE SEED LOCALIZATION ERAS;  Surgeon: Erroll Luna, MD;  Location: [facility];  Service: General;  Laterality: Left;  . CARDIAC CATHETERIZATION    . CARDIAC CATHETERIZATION N/A 12/29/2014   Procedure: Left Heart Cath and Coronary Angiography;  Surgeon: Sherren Mocha, MD;  Location: [facility] CV [facility];  Service: Cardiovascular;  Laterality: N/A;  . CAROTID ENDARTERECTOMY  march 2012   per Dr. Morton Amy  . CARPAL TUNNEL RELEASE Left 05/14/2014   Procedure:  Left Carpal tunnel release;  Surgeon: Ashok Pall, MD;  Location: [facility];  Service: Neurosurgery;  Laterality: Left;  Left Carpal tunnel release  . CARPAL TUNNEL RELEASE Bilateral   . CHOLECYSTECTOMY    . COLONOSCOPY  05-29-11   per Dr. Henrene Pastor, benign polyps, repeat in 5 yrs    . CORONARY STENT PLACEMENT     Dr. Burt Knack  . DILATION AND CURETTAGE OF UTERUS    . ESOPHAGOGASTRODUODENOSCOPY  02-08-06   per Dr. Henrene Pastor, gastritis   . EYE SURGERY Left June 2016   Cataract  . GLAUCOMA SURGERY     with laser, per Dr. Henrene Pastor   . HARDWARE REMOVAL  10/30/2016   Procedure: HARDWARE REMOVAL;  Surgeon: Jessy Oto, MD;  Location: [facility];  Service: Orthopedics;;  . LUMBAR FUSION  2008  . POLYPECTOMY    . POSTERIOR LUMBAR FUSION  10/30/2016   Removal of hardware rods and  pedicle screws lumbar four, Extension of fusion L4-5 to L5-S1 with reinsertion of screws at L 4, left L5-S1 transforaminal lumbar interbody fusion, Exploration  left L5 nerve root, bilateral decompressive laminectomy L3-4/notes 10/30/2016  . ULNAR NERVE TRANSPOSITION Left 05/14/2014   Procedure: Left Ulnar nerve decompression;  Surgeon: Ashok Pall, MD;  Location: [facility];  Service: Neurosurgery;  Laterality: Left;  Left Ulnar nerve decompression    There were no vitals filed for this visit.   Subjective Assessment - 10/02/17 0946    Subjective  She reports lower  back pain.   started 2 years ago . Lower back surgery 7=8 years ago and second surgery 10/2016. RT hip pain ,  no pain releif post surgery. Pain into buttock at times. Minimal HEp     Pertinent History  knee scope bilateral    Limitations  Walking;House hold activities dressing , sleep    How long can you sit comfortably?  30 min    How long can you stand comfortably?  7-8 min    How long can you walk comfortably?  with SPC 150-200 feet ( fatigue)    Diagnostic tests  xray 01/2017    Patient Stated Goals  She wants pain out of hip and in back.     Currently in Pain?  Yes    Pain Score  6     Pain Location  Back    Pain Orientation  Lower;Mid;Right;Left    Pain Descriptors / Indicators  Constant pain    Pain Type  Chronic pain    Pain Radiating Towards  RT buttock    Pain Onset  More than a month ago    Pain Frequency  Constant    Aggravating Factors   AM waking ,     Pain Relieving Factors  meds         [facility] PT Assessment - 10/02/17 0001      Assessment   Medical Diagnosis  chronic LBP and Rt hip pain    Referring Provider  Basil Dess, MD    Onset Date/Surgical Date  -- 2 years ago    Next MD Visit  2 weeks    Prior Therapy  Post first back surgery      Precautions   Precautions  None      Restrictions   Weight Bearing Restrictions  No      Balance Screen   Has the patient fallen in the past 6 months  No       Prior Function   Level of Independence  Independent    Vocation  Retired      Charity fundraiser Status  Within Functional Limits for tasks assessed      Observation/Other Assessments   Focus on Therapeutic Outcomes (FOTO)   56% limited      Posture/Postural Control   Posture Comments  flexed trunk and hips in standing, lateal shift Lt in standing,   RT hip higher than LT       ROM / Strength   AROM / PROM / Strength  AROM;PROM;Strength      AROM   AROM Assessment Site  Lumbar    Lumbar Flexion  40    Lumbar Extension  10    Lumbar - Right Side Bend  5    Lumbar - Left Side Bend  8      PROM   Overall PROM Comments  hip ER 40 degrees IR 15 degrees   Groin pain on RT with ER and IR       Strength   Overall Strength Comments  RT leg 4/5  Lt hip 4/5 high and Lower leg 4+/5              No data recorded  Objective measurements completed on examination: See above findings.                PT Short Term Goals - 10/02/17 1018      PT SHORT TERM GOAL #1   Title  She will be independent with initial HEp    Time  4    Period  Weeks    Status  New      PT SHORT TERM GOAL #2   Title  She will report pain decr 30% in back    Time  4    Period  Weeks    Status  New        PT Long Term Goals - 10/02/17 1018      PT LONG TERM GOAL #1   Title  She will be independnet with all HEP issued    Time  8    Period  Weeks    Status  New      PT LONG TERM GOAL #2   Title  She will report pain in back decr 50% generally    Time  8    Period  Weeks    Status  New      PT LONG TERM GOAL #3   Title  She will report RT hip pain decr 40% or more    Time  8    Period  Weeks    Status  New      PT LONG TERM GOAL #4   Title  She will be able to walk 300 feet or more without excess fatigue or pain    Time  8    Period  Weeks    Status  New      PT LONG TERM GOAL #5   Title  She will report greater ease with donning shoes and socks     Time  8    Period  Weeks    Status  New             Plan - 10/02/17 1017    Clinical Impression Statement  Ms Venn presents withchronic RT hip and lower back pain with stiffness of spne and hips and significnat postural abnormalities, bilateral Le and core weakness all limiting mobility and activity in home.  Significnat changes are not likely but she should have some decr pain if she does her HEP along with PT     Clinical Presentation  Stable    Clinical Decision Making  Low    Rehab Potential  Fair    PT Frequency  2x / week    PT Duration  8 weeks    PT Treatment/Interventions  Electrical Stimulation;Moist Heat;Therapeutic exercise;Patient/family education;Manual techniques;Dry needling;Passive range of motion;Taping    PT Next Visit Plan  HEP , Manual , modalites    Consulted and Agree with Plan of Care  Patient       Patient will benefit from skilled therapeutic intervention in order to improve the following deficits and impairments:  Decreased endurance, Decreased activity tolerance, Decreased range of motion, Decreased strength, Pain, Postural dysfunction, Difficulty walking  Visit Diagnosis: Chronic bilateral low back pain without sciatica - Plan: PT plan of care cert/re-cert  Abnormal posture - Plan: PT plan of care cert/re-cert  Muscle spasm of back - Plan: PT plan of care cert/re-cert  Pain in right hip - Plan: PT plan of care cert/re-cert  Muscle weakness (generalized) - Plan: PT plan of care cert/re-cert     Problem List Patient Active Problem List   Diagnosis Date Noted  . Hyperlipidemia 07/17/2017  . History of lumbar spinal fusion   . History of fusion of lumbar spine   . Anemia due to blood loss 11/02/2016    Class: Acute  . Lethargy 10/31/2016  . AKI (acute kidney injury) ([place]) 10/31/2016  . Chronic diastolic CHF (congestive heart failure) ([place]) 10/31/2016  . Cough   . Leukocytosis   . Spondylolisthesis, lumbar region 10/30/2016    Class:  Chronic  . Spinal stenosis of lumbar region 10/30/2016    Class: Chronic  . Retained orthopedic hardware 10/30/2016    Class: Chronic  . Spinal stenosis of lumbar region with neurogenic claudication 10/30/2016  . Genetic testing 09/14/2015  . Atypical ductal hyperplasia of left breast 08/24/2015  . Family history of breast cancer   . Family history of prostate cancer   . Family history of stomach cancer   . Abnormal nuclear stress test 12/29/2014  . Cervical spondylosis with myelopathy and radiculopathy 06/11/2014  . Carpal tunnel syndrome 05/14/2014  . Aftercare following surgery of the circulatory system, [place] 03/11/2014  . Pain of right lower extremity 03/11/2014  . Atypical lobular hyperplasia of left breast 02/12/2014  . Fibromyalgia 09/21/2013  . Carotid artery disease ([place]) 02/29/2012  . Back abscess 01/22/2012  . Chest pain with moderate risk of acute coronary syndrome 12/09/2009  . Shortness of breath 06/10/2009  . Orthostatic hypotension 01/31/2009  . CAD (coronary artery disease) 12/23/2008  . Normocytic anemia 07/21/2008  . Headache(784.0) 07/21/2008  . History of TIA (transient ischemic attack) 07/21/2008  . Edema 02/27/2008  . Diabetes mellitus with complication ([place]) 72/03/4708  . Depression 07/16/2007  . Dyslipidemia 02/21/2007  . Essential hypertension 02/21/2007  . ASTHMA 02/21/2007  . GERD 02/21/2007  . LOW BACK PAIN 02/21/2007    Darrel Hoover  PT 10/02/2017, 10:46 AM  [facility] [address] [place], [name], [address] Phone: [phone]   Fax:  [phone]  Name: [name] MRN: [id number] Date of Birth: May 08, 1946

## 2017-10-08 ENCOUNTER — Ambulatory Visit: Payer: Medicare Other | Attending: Specialist

## 2017-10-08 DIAGNOSIS — M545 Low back pain: Secondary | ICD-10-CM | POA: Insufficient documentation

## 2017-10-08 DIAGNOSIS — M62830 Muscle spasm of back: Secondary | ICD-10-CM | POA: Insufficient documentation

## 2017-10-08 DIAGNOSIS — M6281 Muscle weakness (generalized): Secondary | ICD-10-CM | POA: Insufficient documentation

## 2017-10-08 DIAGNOSIS — G8929 Other chronic pain: Secondary | ICD-10-CM | POA: Insufficient documentation

## 2017-10-08 DIAGNOSIS — R293 Abnormal posture: Secondary | ICD-10-CM | POA: Insufficient documentation

## 2017-10-08 DIAGNOSIS — M25551 Pain in right hip: Secondary | ICD-10-CM | POA: Diagnosis not present

## 2017-10-08 NOTE — Therapy (Signed)
[place], [name], [address] Phone: [phone]   Fax:  [phone]  Physical Therapy Treatment  Patient Details  Name: [name] MRN: [id number] Date of Birth: [birth year] Referring Provider: Basil Dess, MD   Encounter Date: 10/08/2017  PT End of Session - 10/08/17 1140    Visit Number  2    Number of Visits  17    Date for PT Re-Evaluation  11/29/17    Authorization Type  MCR    PT Start Time  6761    PT Stop Time  1232    PT Time Calculation (min)  47 min    Activity Tolerance  Patient tolerated treatment well;Patient limited by pain    Behavior During Therapy  [facility] for tasks assessed/performed       Past Medical History:  Diagnosis Date  . Abnormal nuclear stress test 12/29/2014  . Allergy   . Anemia, unspecified   . Arthritis   . Blood transfusion without reported diagnosis    2018  . CAD (coronary artery disease)    sees. Dr. Burt Knack. s/p cath with PCI of RCA (xience des) June 2010. Pt also with LAD and D2 dzs-NL FFR in both areas med Rx  . Carotid artery occlusion   . Cataract    sees Dr. Herbert Deaner   . Depressive disorder, not elsewhere classified   . Duodenal nodule   . Edema   . Fibromyalgia   . Gastric polyps    COLON  . GERD (gastroesophageal reflux disease)   . Glaucoma    narrow angle, sees Dr. Herbert Deaner   . Headache(784.0)   . Hearing loss    left ear  . Heart murmur    questionable  . Hemorrhoids   . HSV (herpes simplex virus) anogenital infection 05/2014  . HTN (hypertension)   . Hyperlipidemia   . Low back pain   . Obesity   . Peripheral vascular disease ([place])   . Stroke [facility])    TIA  "many yrs ago"    . TIA (transient ischemic attack)    also Hx of it.   . Type II or unspecified type diabetes mellitus without mention of complication, not stated as uncontrolled    sees Dr. Carrolyn Meiers     Past Surgical History:  Procedure Laterality Date  . ABDOMINAL  HYSTERECTOMY  age 73   TAH and USO  Leiomyomata  . ANTERIOR CERVICAL CORPECTOMY N/A 06/11/2014   Procedure: ANTERIOR CERVICAL CORPECTOMY CERVICAL SIX; WITH CERVICAL FIVE TO CERVICAL SEVEN ARTHRODESIS;  Surgeon: Ashok Pall, MD;  Location: [place] [facility];  Service: Neurosurgery;  Laterality: N/A;  . BACK SURGERY  2008   lumb fusion  . BREAST BIOPSY Left 01/26/2014   Procedure: EXCISION LEFT BREAST MASS;  Surgeon: Adin Hector, MD;  Location: [place];  Service: General;  Laterality: Left;  . BREAST LUMPECTOMY WITH RADIOACTIVE SEED LOCALIZATION Left 07/24/2017   Procedure: LEFT BREAST LUMPECTOMY WITH RADIOACTIVE SEED LOCALIZATION ERAS;  Surgeon: Erroll Luna, MD;  Location: [place];  Service: General;  Laterality: Left;  . CARDIAC CATHETERIZATION    . CARDIAC CATHETERIZATION N/A 12/29/2014   Procedure: Left Heart Cath and Coronary Angiography;  Surgeon: Sherren Mocha, MD;  Location: [place] CV LAB;  Service: Cardiovascular;  Laterality: N/A;  . CAROTID ENDARTERECTOMY  march 2012   per Dr. Morton Amy  . CARPAL TUNNEL RELEASE Left 05/14/2014   Procedure: Left Carpal tunnel release;  Surgeon: Ashok Pall, MD;  Location: [place] NEUR[facility] ORS;  Service: Neurosurgery;  Laterality: Left;  Left Carpal tunnel release  . CARPAL TUNNEL RELEASE Bilateral   . CHOLECYSTECTOMY    . COLONOSCOPY  05-29-11   per Dr. Henrene Pastor, benign polyps, repeat in 5 yrs    . CORONARY STENT PLACEMENT     Dr. Burt Knack  . DILATION AND CURETTAGE OF UTERUS    . ESOPHAGOGASTRODUODENOSCOPY  02-08-06   per Dr. Henrene Pastor, gastritis   . EYE SURGERY Left June 2016   Cataract  . GLAUCOMA SURGERY     with laser, per Dr. Henrene Pastor   . HARDWARE REMOVAL  10/30/2016   Procedure: HARDWARE REMOVAL;  Surgeon: Jessy Oto, MD;  Location: [place];  Service: Orthopedics;;  . LUMBAR FUSION  2008  . POLYPECTOMY    . POSTERIOR LUMBAR FUSION  10/30/2016   Removal of hardware rods and pedicle screws lumbar four,  Extension of fusion L4-5 to L5-S1 with reinsertion of screws at L 4, left L5-S1 transforaminal lumbar interbody fusion, Exploration  left L5 nerve root, bilateral decompressive laminectomy L3-4/notes 10/30/2016  . ULNAR NERVE TRANSPOSITION Left 05/14/2014   Procedure: Left Ulnar nerve decompression;  Surgeon: Ashok Pall, MD;  Location: [place] NEUR[facility] ORS;  Service: Neurosurgery;  Laterality: Left;  Left Ulnar nerve decompression    There were no vitals filed for this visit.  Subjective Assessment - 10/08/17 1146    Subjective  6/10 pain today    Pain Score  6     Pain Location  Back    Pain Orientation  Right;Left;Mid;Lower    Pain Descriptors / Indicators  Constant    Pain Type  Chronic pain    Pain Onset  More than a month ago    Pain Frequency  Constant                       [facility] Adult PT Treatment/Exercise - 10/08/17 0001      Exercises   Exercises  Lumbar      Lumbar Exercises: Stretches   Single Knee to Chest Stretch  Right;Left;3 reps;20 seconds    Lower Trunk Rotation  3 reps;10 seconds;Limitations    Lower Trunk Rotation Limitations  R/L    Other Lumbar Stretch Exercise  --      Lumbar Exercises: Aerobic   Nustep  L4 LE only  5 min      Lumbar Exercises: Seated   Long Arc Quad on Chair  Right;Left;15 reps    Sit to Stand  10 reps      Lumbar Exercises: Supine   Pelvic Tilt  15 reps    Bridge  10 reps    Other Supine Lumbar Exercises  12 reps all squeez and clam with glut contraction.       Modalities   Modalities  Moist Heat      Moist Heat Therapy   Number Minutes Moist Heat  15 Minutes part during treatment    Moist Heat Location  Lumbar Spine               PT Short Term Goals - 10/02/17 1018      PT SHORT TERM GOAL #1   Title  She will be independent with initial HEp    Time  4    Period  Weeks    Status  New      PT SHORT TERM GOAL #2   Title  She will report pain decr 30%  in back    Time  4    Period  Weeks    Status  New         PT Long Term Goals - 10/02/17 1018      PT LONG TERM GOAL #1   Title  She will be independnet with all HEP issued    Time  8    Period  Weeks    Status  New      PT LONG TERM GOAL #2   Title  She will report pain in back decr 50% generally    Time  8    Period  Weeks    Status  New      PT LONG TERM GOAL #3   Title  She will report RT hip pain decr 40% or more    Time  8    Period  Weeks    Status  New      PT LONG TERM GOAL #4   Title  She will be able to walk 300 feet or more without excess fatigue or pain    Time  8    Period  Weeks    Status  New      PT LONG TERM GOAL #5   Title  She will report greater ease with donning shoes and socks    Time  8    Period  Weeks    Status  New            Plan - 10/08/17 1140    Clinical Impression Statement  Pain limiting. She was tolerant of exer done but bridge i very difficult and painful so limited to very short movement after PPT.  she fels better with heat.   Continue strength and stretch . limit to light extension exer for now    PT Treatment/Interventions  Electrical Stimulation;Moist Heat;Therapeutic exercise;Patient/family education;Manual techniques;Dry needling;Passive range of motion;Taping    PT Next Visit Plan  HEP , Manual , modalites,  add hamstring stretch/clam and ball squeeze for home    review HEP    PT Home Exercise Plan  PPT , LTR , lumbar stab 1 except heel slide    Consulted and Agree with Plan of Care  Patient       Patient will benefit from skilled therapeutic intervention in order to improve the following deficits and impairments:  Decreased endurance, Decreased activity tolerance, Decreased range of motion, Decreased strength, Pain, Postural dysfunction, Difficulty walking  Visit Diagnosis: Chronic bilateral low back pain without sciatica  Abnormal posture  Muscle spasm of back  Pain in right hip  Muscle weakness (generalized)     Problem List Patient Active Problem List    Diagnosis Date Noted  . Hyperlipidemia 07/17/2017  . History of lumbar spinal fusion   . History of fusion of lumbar spine   . Anemia due to blood loss 11/02/2016    Class: Acute  . Lethargy 10/31/2016  . AKI (acute kidney injury) ([place]) 10/31/2016  . Chronic diastolic CHF (congestive heart failure) ([place]) 10/31/2016  . Cough   . Leukocytosis   . Spondylolisthesis, lumbar region 10/30/2016    Class: Chronic  . Spinal stenosis of lumbar region 10/30/2016    Class: Chronic  . Retained orthopedic hardware 10/30/2016    Class: Chronic  . Spinal stenosis of lumbar region with neurogenic claudication 10/30/2016  . Genetic testing 09/14/2015  . Atypical ductal hyperplasia of left breast 08/24/2015  . Family history of breast cancer   .  Family history of prostate cancer   . Family history of stomach cancer   . Abnormal nuclear stress test 12/29/2014  . Cervical spondylosis with myelopathy and radiculopathy 06/11/2014  . Carpal tunnel syndrome 05/14/2014  . Aftercare following surgery of the circulatory system, [place] 03/11/2014  . Pain of right lower extremity 03/11/2014  . Atypical lobular hyperplasia of left breast 02/12/2014  . Fibromyalgia 09/21/2013  . Carotid artery disease ([place]) 02/29/2012  . Back abscess 01/22/2012  . Chest pain with moderate risk of acute coronary syndrome 12/09/2009  . Shortness of breath 06/10/2009  . Orthostatic hypotension 01/31/2009  . CAD (coronary artery disease) 12/23/2008  . Normocytic anemia 07/21/2008  . Headache(784.0) 07/21/2008  . History of TIA (transient ischemic attack) 07/21/2008  . Edema 02/27/2008  . Diabetes mellitus with complication ([place]) 95/18/8416  . Depression 07/16/2007  . Dyslipidemia 02/21/2007  . Essential hypertension 02/21/2007  . ASTHMA 02/21/2007  . GERD 02/21/2007  . LOW BACK PAIN 02/21/2007    Darrel Hoover  PT 10/08/2017, 12:27 PM  [facility] [address] [place], [name], [address] Phone: [phone]   Fax:  [phone]  Name: [name] MRN: [id number] Date of Birth: 1946-02-22

## 2017-10-10 ENCOUNTER — Ambulatory Visit: Payer: Medicare Other | Admitting: Physical Therapy

## 2017-10-10 ENCOUNTER — Encounter: Payer: Self-pay | Admitting: Physical Therapy

## 2017-10-10 DIAGNOSIS — M6281 Muscle weakness (generalized): Secondary | ICD-10-CM

## 2017-10-10 DIAGNOSIS — G8929 Other chronic pain: Secondary | ICD-10-CM

## 2017-10-10 DIAGNOSIS — R293 Abnormal posture: Secondary | ICD-10-CM | POA: Diagnosis not present

## 2017-10-10 DIAGNOSIS — M62830 Muscle spasm of back: Secondary | ICD-10-CM | POA: Diagnosis not present

## 2017-10-10 DIAGNOSIS — M25551 Pain in right hip: Secondary | ICD-10-CM | POA: Diagnosis not present

## 2017-10-10 DIAGNOSIS — M545 Low back pain: Principal | ICD-10-CM

## 2017-10-11 NOTE — Therapy (Signed)
[place], [name], [address] Phone: [phone]   Fax:  [phone]  Physical Therapy Treatment  Patient Details  Name: [name] MRN: [id number] Date of Birth: [birth year] Referring Provider: Basil Dess, MD   Encounter Date: 10/10/2017  PT End of Session - 10/10/17 1309    Visit Number  3    Number of Visits  17    Date for PT Re-Evaluation  11/29/17    Authorization Type  MCR    PT Start Time  1300    PT Stop Time  1400    PT Time Calculation (min)  60 min       Past Medical History:  Diagnosis Date  . Abnormal nuclear stress test 12/29/2014  . Allergy   . Anemia, unspecified   . Arthritis   . Blood transfusion without reported diagnosis    2018  . CAD (coronary artery disease)    sees. Dr. Burt Knack. s/p cath with PCI of RCA (xience des) June 2010. Pt also with LAD and D2 dzs-NL FFR in both areas med Rx  . Carotid artery occlusion   . Cataract    sees Dr. Herbert Deaner   . Depressive disorder, not elsewhere classified   . Duodenal nodule   . Edema   . Fibromyalgia   . Gastric polyps    COLON  . GERD (gastroesophageal reflux disease)   . Glaucoma    narrow angle, sees Dr. Herbert Deaner   . Headache(784.0)   . Hearing loss    left ear  . Heart murmur    questionable  . Hemorrhoids   . HSV (herpes simplex virus) anogenital infection 05/2014  . HTN (hypertension)   . Hyperlipidemia   . Low back pain   . Obesity   . Peripheral vascular disease ([place])   . Stroke [facility])    TIA  "many yrs ago"    . TIA (transient ischemic attack)    also Hx of it.   . Type II or unspecified type diabetes mellitus without mention of complication, not stated as uncontrolled    sees Dr. Carrolyn Meiers     Past Surgical History:  Procedure Laterality Date  . ABDOMINAL HYSTERECTOMY  age 22   TAH and USO  Leiomyomata  . ANTERIOR CERVICAL CORPECTOMY N/A 06/11/2014   Procedure: ANTERIOR CERVICAL CORPECTOMY CERVICAL  SIX; WITH CERVICAL FIVE TO CERVICAL SEVEN ARTHRODESIS;  Surgeon: Ashok Pall, MD;  Location: [place] NEUR[facility] ORS;  Service: Neurosurgery;  Laterality: N/A;  . BACK SURGERY  2008   lumb fusion  . BREAST BIOPSY Left 01/26/2014   Procedure: EXCISION LEFT BREAST MASS;  Surgeon: Adin Hector, MD;  Location: [place];  Service: General;  Laterality: Left;  . BREAST LUMPECTOMY WITH RADIOACTIVE SEED LOCALIZATION Left 07/24/2017   Procedure: LEFT BREAST LUMPECTOMY WITH RADIOACTIVE SEED LOCALIZATION ERAS;  Surgeon: Erroll Luna, MD;  Location: [place];  Service: General;  Laterality: Left;  . CARDIAC CATHETERIZATION    . CARDIAC CATHETERIZATION N/A 12/29/2014   Procedure: Left Heart Cath and Coronary Angiography;  Surgeon: Sherren Mocha, MD;  Location: [place] CV LAB;  Service: Cardiovascular;  Laterality: N/A;  . CAROTID ENDARTERECTOMY  march 2012   per Dr. Morton Amy  . CARPAL TUNNEL RELEASE Left 05/14/2014   Procedure: Left Carpal tunnel release;  Surgeon: Ashok Pall, MD;  Location: [place] NE[facility] OR[facility];  Service: Neurosurgery;  Laterality: Left;  Left Carpal tunnel release  . CARPAL TUNNEL  RELEASE Bilateral   . CHOLECYSTECTOMY    . COLONOSCOPY  05-29-11   per Dr. Henrene Pastor, benign polyps, repeat in 5 yrs    . CORONARY STENT PLACEMENT     Dr. Burt Knack  . DILATION AND CURETTAGE OF UTERUS    . ESOPHAGOGASTRODUODENOSCOPY  02-08-06   per Dr. Henrene Pastor, gastritis   . EYE SURGERY Left June 2016   Cataract  . GLAUCOMA SURGERY     with laser, per Dr. Henrene Pastor   . HARDWARE REMOVAL  10/30/2016   Procedure: HARDWARE REMOVAL;  Surgeon: Jessy Oto, MD;  Location: [place];  Service: Orthopedics;;  . LUMBAR FUSION  2008  . POLYPECTOMY    . POSTERIOR LUMBAR FUSION  10/30/2016   Removal of hardware rods and pedicle screws lumbar four, Extension of fusion L4-5 to L5-S1 with reinsertion of screws at L 4, left L5-S1 transforaminal lumbar interbody fusion, Exploration  left L5 nerve root,  bilateral decompressive laminectomy L3-4/notes 10/30/2016  . ULNAR NERVE TRANSPOSITION Left 05/14/2014   Procedure: Left Ulnar nerve decompression;  Surgeon: Ashok Pall, MD;  Location: [place] NEURO ORS;  Service: Neurosurgery;  Laterality: Left;  Left Ulnar nerve decompression    There were no vitals filed for this visit.  Subjective Assessment - 10/10/17 1309    Currently in Pain?  Yes    Pain Score  5     Pain Location  Back    Pain Orientation  Right;Mid    Pain Descriptors / Indicators  Sore stiffness    Pain Radiating Towards  Rt hip     Aggravating Factors   after driving, first waking up    Pain Relieving Factors  keep moving                        [facility] Adult PT Treatment/Exercise - 10/11/17 0001      Lumbar Exercises: Stretches   Single Knee to Chest Stretch  Right;Left;3 reps;20 seconds    Piriformis Stretch  20 seconds;3 reps    Figure 4 Stretch  20 seconds;3 reps      Lumbar Exercises: Aerobic   Nustep  L4 LE only  5 min      Lumbar Exercises: Seated   Long Arc Quad on Chair  20 reps 2#    Sit to Stand  10 reps      Lumbar Exercises: Supine   Pelvic Tilt  15 reps    Clam  10 reps green       Lumbar Exercises: Sidelying   Other Sidelying Lumbar Exercises  Right QL and Latissimus stretch over pillow roll x 3 minutes       Moist Heat Therapy   Number Minutes Moist Heat  15 Minutes    Moist Heat Location  Lumbar Spine      Manual Therapy   Manual Therapy  Soft tissue mobilization    Soft tissue mobilization  massage roller to right posteriolateral hip              PT Education - 10/10/17 1349    Education provided  Yes    Education Details  HEP    Person(s) Educated  Patient    Methods  Handout    Comprehension  Verbalized understanding       PT Short Term Goals - 10/02/17 1018      PT SHORT TERM GOAL #1   Title  She will be independent with initial HEp    Time  4  Period  Weeks    Status  New      PT SHORT TERM GOAL #2    Title  She will report pain decr 30% in back    Time  4    Period  Weeks    Status  New        PT Long Term Goals - 10/02/17 1018      PT LONG TERM GOAL #1   Title  She will be independnet with all HEP issued    Time  8    Period  Weeks    Status  New      PT LONG TERM GOAL #2   Title  She will report pain in back decr 50% generally    Time  8    Period  Weeks    Status  New      PT LONG TERM GOAL #3   Title  She will report RT hip pain decr 40% or more    Time  8    Period  Weeks    Status  New      PT LONG TERM GOAL #4   Title  She will be able to walk 300 feet or more without excess fatigue or pain    Time  8    Period  Weeks    Status  New      PT LONG TERM GOAL #5   Title  She will report greater ease with donning shoes and socks    Time  8    Period  Weeks    Status  New            Plan - 10/11/17 3086    Clinical Impression Statement  Continued core and hip stability added stretches. HMP at end of session. Time spent with log roll. She requires increased time and cues to avoid pain. She liked right QL stretch, pain was centralized to mid low back after.     PT Next Visit Plan  HEP , Manual , modalites,  add hamstring stretch/clam and ball squeeze for home    review HEP    PT Home Exercise Plan  PPT , LTR , lumbar stab 1 except heel slide, piriformis and figure 4 stretch, side lying QL stretch     Consulted and Agree with Plan of Care  Patient       Patient will benefit from skilled therapeutic intervention in order to improve the following deficits and impairments:  Decreased endurance, Decreased activity tolerance, Decreased range of motion, Decreased strength, Pain, Postural dysfunction, Difficulty walking  Visit Diagnosis: Chronic bilateral low back pain without sciatica  Abnormal posture  Muscle spasm of back  Pain in right hip  Muscle weakness (generalized)     Problem List Patient Active Problem List   Diagnosis Date Noted  .  Hyperlipidemia 07/17/2017  . History of lumbar spinal fusion   . History of fusion of lumbar spine   . Anemia due to blood loss 11/02/2016    Class: Acute  . Lethargy 10/31/2016  . AKI (acute kidney injury) ([place]) 10/31/2016  . Chronic diastolic CHF (congestive heart failure) ([place]) 10/31/2016  . Cough   . Leukocytosis   . Spondylolisthesis, lumbar region 10/30/2016    Class: Chronic  . Spinal stenosis of lumbar region 10/30/2016    Class: Chronic  . Retained orthopedic hardware 10/30/2016    Class: Chronic  . Spinal stenosis of lumbar region with neurogenic claudication 10/30/2016  . Genetic  testing 09/14/2015  . Atypical ductal hyperplasia of left breast 08/24/2015  . Family history of breast cancer   . Family history of prostate cancer   . Family history of stomach cancer   . Abnormal nuclear stress test 12/29/2014  . Cervical spondylosis with myelopathy and radiculopathy 06/11/2014  . Carpal tunnel syndrome 05/14/2014  . Aftercare following surgery of the circulatory system, [place] 03/11/2014  . Pain of right lower extremity 03/11/2014  . Atypical lobular hyperplasia of left breast 02/12/2014  . Fibromyalgia 09/21/2013  . Carotid artery disease ([place]) 02/29/2012  . Back abscess 01/22/2012  . Chest pain with moderate risk of acute coronary syndrome 12/09/2009  . Shortness of breath 06/10/2009  . Orthostatic hypotension 01/31/2009  . CAD (coronary artery disease) 12/23/2008  . Normocytic anemia 07/21/2008  . Headache(784.0) 07/21/2008  . History of TIA (transient ischemic attack) 07/21/2008  . Edema 02/27/2008  . Diabetes mellitus with complication ([place]) 37/48/2707  . Depression 07/16/2007  . Dyslipidemia 02/21/2007  . Essential hypertension 02/21/2007  . ASTHMA 02/21/2007  . GERD 02/21/2007  . LOW BACK PAIN 02/21/2007    Dorene Ar, PTA 10/11/2017, 8:43 AM  [facility] [address] [place], [name],  [address] Phone: [phone]   Fax:  [phone]  Name: [name] MRN: [id number] Date of Birth: Apr 23, 1946

## 2017-10-15 ENCOUNTER — Ambulatory Visit: Payer: Medicare Other | Admitting: Physical Therapy

## 2017-10-15 ENCOUNTER — Encounter: Payer: Self-pay | Admitting: Physical Therapy

## 2017-10-15 DIAGNOSIS — M545 Low back pain: Principal | ICD-10-CM

## 2017-10-15 DIAGNOSIS — R293 Abnormal posture: Secondary | ICD-10-CM

## 2017-10-15 DIAGNOSIS — M62830 Muscle spasm of back: Secondary | ICD-10-CM

## 2017-10-15 DIAGNOSIS — G8929 Other chronic pain: Secondary | ICD-10-CM

## 2017-10-15 DIAGNOSIS — M6281 Muscle weakness (generalized): Secondary | ICD-10-CM

## 2017-10-15 DIAGNOSIS — M25551 Pain in right hip: Secondary | ICD-10-CM | POA: Diagnosis not present

## 2017-10-15 NOTE — Therapy (Signed)
[place], [name], [address] Phone: [phone]   Fax:  [phone]  Physical Therapy Treatment  Patient Details  Name: [name] MRN: [id number] Date of Birth: 1945-11-08 Referring Provider: Basil Dess, MD   Encounter Date: 10/15/2017  PT End of Session - 10/15/17 0957    Visit Number  4    Number of Visits  17    Date for PT Re-Evaluation  11/29/17    Authorization Type  MCR    PT Start Time  4765    PT Stop Time  4650    PT Time Calculation (min)  55 min       Past Medical History:  Diagnosis Date  . Abnormal nuclear stress test 12/29/2014  . Allergy   . Anemia, unspecified   . Arthritis   . Blood transfusion without reported diagnosis    2018  . CAD (coronary artery disease)    sees. Dr. Burt Knack. s/p cath with PCI of RCA (xience des) June 2010. Pt also with LAD and D2 dzs-NL FFR in both areas med Rx  . Carotid artery occlusion   . Cataract    sees Dr. Herbert Deaner   . Depressive disorder, not elsewhere classified   . Duodenal nodule   . Edema   . Fibromyalgia   . Gastric polyps    COLON  . GERD (gastroesophageal reflux disease)   . Glaucoma    narrow angle, sees Dr. Herbert Deaner   . Headache(784.0)   . Hearing loss    left ear  . Heart murmur    questionable  . Hemorrhoids   . HSV (herpes simplex virus) anogenital infection 05/2014  . HTN (hypertension)   . Hyperlipidemia   . Low back pain   . Obesity   . Peripheral vascular disease ([place])   . Stroke [facility])    TIA  "many yrs ago"    . TIA (transient ischemic attack)    also Hx of it.   . Type II or unspecified type diabetes mellitus without mention of complication, not stated as uncontrolled    sees Dr. Carrolyn Meiers     Past Surgical History:  Procedure Laterality Date  . ABDOMINAL HYSTERECTOMY  age 72   TAH and USO  Leiomyomata  . ANTERIOR CERVICAL CORPECTOMY N/A 06/11/2014   Procedure: ANTERIOR CERVICAL CORPECTOMY CERVICAL  SIX; WITH CERVICAL FIVE TO CERVICAL SEVEN ARTHRODESIS;  Surgeon: Ashok Pall, MD;  Location: [place] [facility];  Service: Neurosurgery;  Laterality: N/A;  . BACK SURGERY  2008   lumb fusion  . BREAST BIOPSY Left 01/26/2014   Procedure: EXCISION LEFT BREAST MASS;  Surgeon: Adin Hector, MD;  Location: [place];  Service: General;  Laterality: Left;  . BREAST LUMPECTOMY WITH RADIOACTIVE SEED LOCALIZATION Left 07/24/2017   Procedure: LEFT BREAST LUMPECTOMY WITH RADIOACTIVE SEED LOCALIZATION ERAS;  Surgeon: Erroll Luna, MD;  Location: [place];  Service: General;  Laterality: Left;  . CARDIAC CATHETERIZATION    . CARDIAC CATHETERIZATION N/A 12/29/2014   Procedure: Left Heart Cath and Coronary Angiography;  Surgeon: Sherren Mocha, MD;  Location: [place] CV LAB;  Service: Cardiovascular;  Laterality: N/A;  . CAROTID ENDARTERECTOMY  march 2012   per Dr. Morton Amy  . CARPAL TUNNEL RELEASE Left 05/14/2014   Procedure: Left Carpal tunnel release;  Surgeon: Ashok Pall, MD;  Location: [place] [facility];  Service: Neurosurgery;  Laterality: Left;  Left Carpal tunnel release  . CARPAL TUNNEL  RELEASE Bilateral   . CHOLECYSTECTOMY    . COLONOSCOPY  05-29-11   per Dr. Henrene Pastor, benign polyps, repeat in 5 yrs    . CORONARY STENT PLACEMENT     Dr. Burt Knack  . DILATION AND CURETTAGE OF UTERUS    . ESOPHAGOGASTRODUODENOSCOPY  02-08-06   per Dr. Henrene Pastor, gastritis   . EYE SURGERY Left June 2016   Cataract  . GLAUCOMA SURGERY     with laser, per Dr. Henrene Pastor   . HARDWARE REMOVAL  10/30/2016   Procedure: HARDWARE REMOVAL;  Surgeon: Jessy Oto, MD;  Location: [place];  Service: Orthopedics;;  . LUMBAR FUSION  2008  . POLYPECTOMY    . POSTERIOR LUMBAR FUSION  10/30/2016   Removal of hardware rods and pedicle screws lumbar four, Extension of fusion L4-5 to L5-S1 with reinsertion of screws at L 4, left L5-S1 transforaminal lumbar interbody fusion, Exploration  left L5 nerve root,  bilateral decompressive laminectomy L3-4/notes 10/30/2016  . ULNAR NERVE TRANSPOSITION Left 05/14/2014   Procedure: Left Ulnar nerve decompression;  Surgeon: Ashok Pall, MD;  Location: [place] [facility];  Service: Neurosurgery;  Laterality: Left;  Left Ulnar nerve decompression    There were no vitals filed for this visit.  Subjective Assessment - 10/15/17 0940    Subjective  Sore and painful on right side. Not as painful right now.     Currently in Pain?  Yes    Pain Score  5     Pain Location  Back    Pain Orientation  Right    Pain Descriptors / Indicators  Sore    Pain Radiating Towards  rt hip                       [facility] Adult PT Treatment/Exercise - 10/15/17 0001      Lumbar Exercises: Stretches   Passive Hamstring Stretch  3 reps;30 seconds    ITB Stretch  3 reps;30 seconds    Piriformis Stretch  20 seconds;3 reps    Figure 4 Stretch  20 seconds;3 reps      Lumbar Exercises: Supine   Pelvic Tilt  15 reps    Clam  10 reps green     Bridge  10 reps      Lumbar Exercises: Sidelying   Other Sidelying Lumbar Exercises  Right QL and Latissimus stretch over pillow roll x 3 minutes       Moist Heat Therapy   Number Minutes Moist Heat  15 Minutes    Moist Heat Location  Lumbar Spine      Manual Therapy   Manual Therapy  Soft tissue mobilization    Soft tissue mobilization  massage roller to right posteriolateral hip, ITB , soft tissue work to right Ql and right thoracolumbar paraspinals                PT Short Term Goals - 10/02/17 1018      PT SHORT TERM GOAL #1   Title  She will be independent with initial HEp    Time  4    Period  Weeks    Status  New      PT SHORT TERM GOAL #2   Title  She will report pain decr 30% in back    Time  4    Period  Weeks    Status  New        PT Long Term Goals - 10/02/17 1018  PT LONG TERM GOAL #1   Title  She will be independnet with all HEP issued    Time  8    Period  Weeks    Status  New       PT LONG TERM GOAL #2   Title  She will report pain in back decr 50% generally    Time  8    Period  Weeks    Status  New      PT LONG TERM GOAL #3   Title  She will report RT hip pain decr 40% or more    Time  8    Period  Weeks    Status  New      PT LONG TERM GOAL #4   Title  She will be able to walk 300 feet or more without excess fatigue or pain    Time  8    Period  Weeks    Status  New      PT LONG TERM GOAL #5   Title  She will report greater ease with donning shoes and socks    Time  8    Period  Weeks    Status  New            Plan - 10/15/17 1019    Clinical Impression Statement  Pt reports feeling good after last visit. She rates her pain at 5/10 and it continues to be right mid to lower back and into right hip. Noted ITB soreness today. Used soft tissue work and massage roller to decrease tension. After QL stretch, pt reports centralized low back pain.     PT Next Visit Plan  HEP , Manual , modalites,  add hamstring stretch/clam and ball squeeze for home, continue ITB stretch and roller     PT Home Exercise Plan  PPT , LTR , lumbar stab 1 except heel slide, piriformis and figure 4 stretch, side lying QL stretch     Consulted and Agree with Plan of Care  Patient       Patient will benefit from skilled therapeutic intervention in order to improve the following deficits and impairments:  Decreased endurance, Decreased activity tolerance, Decreased range of motion, Decreased strength, Pain, Postural dysfunction, Difficulty walking  Visit Diagnosis: Chronic bilateral low back pain without sciatica  Abnormal posture  Muscle spasm of back  Pain in right hip  Muscle weakness (generalized)     Problem List Patient Active Problem List   Diagnosis Date Noted  . Hyperlipidemia 07/17/2017  . History of lumbar spinal fusion   . History of fusion of lumbar spine   . Anemia due to blood loss 11/02/2016    Class: Acute  . Lethargy 10/31/2016  . AKI (acute  kidney injury) ([place]) 10/31/2016  . Chronic diastolic CHF (congestive heart failure) ([place]) 10/31/2016  . Cough   . Leukocytosis   . Spondylolisthesis, lumbar region 10/30/2016    Class: Chronic  . Spinal stenosis of lumbar region 10/30/2016    Class: Chronic  . Retained orthopedic hardware 10/30/2016    Class: Chronic  . Spinal stenosis of lumbar region with neurogenic claudication 10/30/2016  . Genetic testing 09/14/2015  . Atypical ductal hyperplasia of left breast 08/24/2015  . Family history of breast cancer   . Family history of prostate cancer   . Family history of stomach cancer   . Abnormal nuclear stress test 12/29/2014  . Cervical spondylosis with myelopathy and radiculopathy 06/11/2014  . Carpal tunnel syndrome  05/14/2014  . Aftercare following surgery of the circulatory system, [place] 03/11/2014  . Pain of right lower extremity 03/11/2014  . Atypical lobular hyperplasia of left breast 02/12/2014  . Fibromyalgia 09/21/2013  . Carotid artery disease ([place]) 02/29/2012  . Back abscess 01/22/2012  . Chest pain with moderate risk of acute coronary syndrome 12/09/2009  . Shortness of breath 06/10/2009  . Orthostatic hypotension 01/31/2009  . CAD (coronary artery disease) 12/23/2008  . Normocytic anemia 07/21/2008  . Headache(784.0) 07/21/2008  . History of TIA (transient ischemic attack) 07/21/2008  . Edema 02/27/2008  . Diabetes mellitus with complication ([place]) 85/63/1497  . Depression 07/16/2007  . Dyslipidemia 02/21/2007  . Essential hypertension 02/21/2007  . ASTHMA 02/21/2007  . GERD 02/21/2007  . LOW BACK PAIN 02/21/2007    Dorene Ar, PTA 10/15/2017, 10:29 AM  [place], [name], [address] Phone: [phone]   Fax:  [phone]  Name: [name] MRN: [id number] Date of Birth: [birth year]

## 2017-10-17 ENCOUNTER — Ambulatory Visit: Payer: Medicare Other

## 2017-10-17 DIAGNOSIS — R293 Abnormal posture: Secondary | ICD-10-CM | POA: Diagnosis not present

## 2017-10-17 DIAGNOSIS — M545 Low back pain: Principal | ICD-10-CM

## 2017-10-17 DIAGNOSIS — M25551 Pain in right hip: Secondary | ICD-10-CM | POA: Diagnosis not present

## 2017-10-17 DIAGNOSIS — M6281 Muscle weakness (generalized): Secondary | ICD-10-CM

## 2017-10-17 DIAGNOSIS — G8929 Other chronic pain: Secondary | ICD-10-CM | POA: Diagnosis not present

## 2017-10-17 DIAGNOSIS — M62830 Muscle spasm of back: Secondary | ICD-10-CM

## 2017-10-17 NOTE — Therapy (Signed)
[name], [name], [address] Phone: [phone]   Fax:  [phone]  Physical Therapy Treatment  Patient Details  Name: [name] MRN: [id number] Date of Birth: February 25, 1946 Referring Provider: Basil Dess, MD   Encounter Date: 10/17/2017  PT End of Session - 10/17/17 1009    Visit Number  5    Number of Visits  17    Date for PT Re-Evaluation  11/29/17    Authorization Type  MCR    PT Start Time  0932    PT Stop Time  1026    PT Time Calculation (min)  54 min    Activity Tolerance  Patient tolerated treatment well    Behavior During Therapy  [facility] for tasks assessed/performed       Past Medical History:  Diagnosis Date  . Abnormal nuclear stress test 12/29/2014  . Allergy   . Anemia, unspecified   . Arthritis   . Blood transfusion without reported diagnosis    2018  . CAD (coronary artery disease)    sees. Dr. Burt Knack. s/p cath with PCI of RCA (xience des) June 2010. Pt also with LAD and D2 dzs-NL FFR in both areas med Rx  . Carotid artery occlusion   . Cataract    sees Dr. Herbert Deaner   . Depressive disorder, not elsewhere classified   . Duodenal nodule   . Edema   . Fibromyalgia   . Gastric polyps    COLON  . GERD (gastroesophageal reflux disease)   . Glaucoma    narrow angle, sees Dr. Herbert Deaner   . Headache(784.0)   . Hearing loss    left ear  . Heart murmur    questionable  . Hemorrhoids   . HSV (herpes simplex virus) anogenital infection 05/2014  . HTN (hypertension)   . Hyperlipidemia   . Low back pain   . Obesity   . Peripheral vascular disease ([place])   . Stroke [facility])    TIA  "many yrs ago"    . TIA (transient ischemic attack)    also Hx of it.   . Type II or unspecified type diabetes mellitus without mention of complication, not stated as uncontrolled    sees Dr. Carrolyn Meiers     Past Surgical History:  Procedure Laterality Date  . ABDOMINAL HYSTERECTOMY  age 23   TAH and  USO  Leiomyomata  . ANTERIOR CERVICAL CORPECTOMY N/A 06/11/2014   Procedure: ANTERIOR CERVICAL CORPECTOMY CERVICAL SIX; WITH CERVICAL FIVE TO CERVICAL SEVEN ARTHRODESIS;  Surgeon: Ashok Pall, MD;  Location: [place] [facility];  Service: Neurosurgery;  Laterality: N/A;  . BACK SURGERY  2008   lumb fusion  . BREAST BIOPSY Left 01/26/2014   Procedure: EXCISION LEFT BREAST MASS;  Surgeon: Adin Hector, MD;  Location: [place];  Service: General;  Laterality: Left;  . BREAST LUMPECTOMY WITH RADIOACTIVE SEED LOCALIZATION Left 07/24/2017   Procedure: LEFT BREAST LUMPECTOMY WITH RADIOACTIVE SEED LOCALIZATION ERAS;  Surgeon: Erroll Luna, MD;  Location: [place];  Service: General;  Laterality: Left;  . CARDIAC CATHETERIZATION    . CARDIAC CATHETERIZATION N/A 12/29/2014   Procedure: Left Heart Cath and Coronary Angiography;  Surgeon: Sherren Mocha, MD;  Location: [place] CV LAB;  Service: Cardiovascular;  Laterality: N/A;  . CAROTID ENDARTERECTOMY  march 2012   per Dr. Morton Amy  . CARPAL TUNNEL RELEASE Left 05/14/2014   Procedure: Left Carpal tunnel release;  Surgeon: Ashok Pall,  MD;  Location: [place] [facility];  Service: Neurosurgery;  Laterality: Left;  Left Carpal tunnel release  . CARPAL TUNNEL RELEASE Bilateral   . CHOLECYSTECTOMY    . COLONOSCOPY  05-29-11   per Dr. Henrene Pastor, benign polyps, repeat in 5 yrs    . CORONARY STENT PLACEMENT     Dr. Burt Knack  . DILATION AND CURETTAGE OF UTERUS    . ESOPHAGOGASTRODUODENOSCOPY  02-08-06   per Dr. Henrene Pastor, gastritis   . EYE SURGERY Left June 2016   Cataract  . GLAUCOMA SURGERY     with laser, per Dr. Henrene Pastor   . HARDWARE REMOVAL  10/30/2016   Procedure: HARDWARE REMOVAL;  Surgeon: Jessy Oto, MD;  Location: [place];  Service: Orthopedics;;  . LUMBAR FUSION  2008  . POLYPECTOMY    . POSTERIOR LUMBAR FUSION  10/30/2016   Removal of hardware rods and pedicle screws lumbar four, Extension of fusion L4-5 to L5-S1  with reinsertion of screws at L 4, left L5-S1 transforaminal lumbar interbody fusion, Exploration  left L5 nerve root, bilateral decompressive laminectomy L3-4/notes 10/30/2016  . ULNAR NERVE TRANSPOSITION Left 05/14/2014   Procedure: Left Ulnar nerve decompression;  Surgeon: Ashok Pall, MD;  Location: [place] [facility];  Service: Neurosurgery;  Laterality: Left;  Left Ulnar nerve decompression    There were no vitals filed for this visit.  Subjective Assessment - 10/17/17 0937    Subjective  Feels can walk better with less hip pain and cont central LBP.  She feels walks a little faster.     Currently in Pain?  Yes    Pain Score  -- 4.5    Pain Location  Hip more than back    Pain Orientation  Right    Pain Descriptors / Indicators  Sore    Pain Type  Chronic pain    Pain Onset  More than a month ago    Pain Frequency  Constant    Aggravating Factors   sitting , AM                        [facility] Adult PT Treatment/Exercise - 10/17/17 0001      Lumbar Exercises: Stretches   Passive Hamstring Stretch  Right;2 reps;60 seconds    Single Knee to Chest Stretch  Right;2 reps;60 seconds    Piriformis Stretch  Right;1 rep;60 seconds      Lumbar Exercises: Sidelying   Clam  Right;Limitations    Clam Limitations  12 reps then reverse clam x 12    Other Sidelying Lumbar Exercises  Right QL and Latissimus stretch over pillow roll x 3 minutes       Moist Heat Therapy   Number Minutes Moist Heat  14 Minutes    Moist Heat Location  Lumbar Spine      Manual Therapy   Soft tissue mobilization  massage roller to right posteriolateral hip, ITB , rocktool soft tissue work to right Ql and right thoracolumbar paraspinals        TE : Clams and reverse clams , PPT all 15 reps        PT Short Term Goals - 10/02/17 1018      PT SHORT TERM GOAL #1   Title  She will be independent with initial HEp    Time  4    Period  Weeks    Status  New      PT SHORT TERM GOAL #2   Title  She  will report pain decr 30% in back    Time  4    Period  Weeks    Status  New        PT Long Term Goals - 10/02/17 1018      PT LONG TERM GOAL #1   Title  She will be independnet with all HEP issued    Time  8    Period  Weeks    Status  New      PT LONG TERM GOAL #2   Title  She will report pain in back decr 50% generally    Time  8    Period  Weeks    Status  New      PT LONG TERM GOAL #3   Title  She will report RT hip pain decr 40% or more    Time  8    Period  Weeks    Status  New      PT LONG TERM GOAL #4   Title  She will be able to walk 300 feet or more without excess fatigue or pain    Time  8    Period  Weeks    Status  New      PT LONG TERM GOAL #5   Title  She will report greater ease with donning shoes and socks    Time  8    Period  Weeks    Status  New            Plan - 10/17/17 1007    Clinical Impression Statement  She reported pain decreased with almost falling asleep with  treatment.  She is progressing but slowly . Treatment is helpful so will see if pain decreases more    PT Treatment/Interventions  Electrical Stimulation;Moist Heat;Therapeutic exercise;Patient/family education;Manual techniques;Dry needling;Passive range of motion;Taping    PT Next Visit Plan  HEP , Manual , modalites,  add hamstring stretch/clam and ball squeeze for home, continue ITB stretch and roller     PT Home Exercise Plan  PPT , LTR , lumbar stab 1 except heel slide, piriformis and figure 4 stretch, side lying QL stretch     Consulted and Agree with Plan of Care  Patient       Patient will benefit from skilled therapeutic intervention in order to improve the following deficits and impairments:  Decreased endurance, Decreased activity tolerance, Decreased range of motion, Decreased strength, Pain, Postural dysfunction, Difficulty walking  Visit Diagnosis: Chronic bilateral low back pain without sciatica  Abnormal posture  Muscle spasm of back  Pain in right  hip  Muscle weakness (generalized)     Problem List Patient Active Problem List   Diagnosis Date Noted  . Hyperlipidemia 07/17/2017  . History of lumbar spinal fusion   . History of fusion of lumbar spine   . Anemia due to blood loss 11/02/2016    Class: Acute  . Lethargy 10/31/2016  . AKI (acute kidney injury) ([place]) 10/31/2016  . Chronic diastolic CHF (congestive heart failure) ([place]) 10/31/2016  . Cough   . Leukocytosis   . Spondylolisthesis, lumbar region 10/30/2016    Class: Chronic  . Spinal stenosis of lumbar region 10/30/2016    Class: Chronic  . Retained orthopedic hardware 10/30/2016    Class: Chronic  . Spinal stenosis of lumbar region with neurogenic claudication 10/30/2016  . Genetic testing 09/14/2015  . Atypical ductal hyperplasia of left breast 08/24/2015  . Family history of breast cancer   .  Family history of prostate cancer   . Family history of stomach cancer   . Abnormal nuclear stress test 12/29/2014  . Cervical spondylosis with myelopathy and radiculopathy 06/11/2014  . Carpal tunnel syndrome 05/14/2014  . Aftercare following surgery of the circulatory system, [place] 03/11/2014  . Pain of right lower extremity 03/11/2014  . Atypical lobular hyperplasia of left breast 02/12/2014  . Fibromyalgia 09/21/2013  . Carotid artery disease ([place]) 02/29/2012  . Back abscess 01/22/2012  . Chest pain with moderate risk of acute coronary syndrome 12/09/2009  . Shortness of breath 06/10/2009  . Orthostatic hypotension 01/31/2009  . CAD (coronary artery disease) 12/23/2008  . Normocytic anemia 07/21/2008  . Headache(784.0) 07/21/2008  . History of TIA (transient ischemic attack) 07/21/2008  . Edema 02/27/2008  . Diabetes mellitus with complication ([place]) 96/10/5407  . Depression 07/16/2007  . Dyslipidemia 02/21/2007  . Essential hypertension 02/21/2007  . ASTHMA 02/21/2007  . GERD 02/21/2007  . LOW BACK PAIN 02/21/2007    Darrel Hoover  PT 10/17/2017,  10:11 AM  [facility] [address] [place], [name], [address] Phone: [phone]   Fax:  [phone]  Name: [name] MRN: [id number] Date of Birth: 05/24/1946

## 2017-10-22 ENCOUNTER — Ambulatory Visit: Payer: Medicare Other

## 2017-10-22 DIAGNOSIS — M6281 Muscle weakness (generalized): Secondary | ICD-10-CM | POA: Diagnosis not present

## 2017-10-22 DIAGNOSIS — M62830 Muscle spasm of back: Secondary | ICD-10-CM | POA: Diagnosis not present

## 2017-10-22 DIAGNOSIS — G8929 Other chronic pain: Secondary | ICD-10-CM

## 2017-10-22 DIAGNOSIS — M25551 Pain in right hip: Secondary | ICD-10-CM

## 2017-10-22 DIAGNOSIS — R293 Abnormal posture: Secondary | ICD-10-CM

## 2017-10-22 DIAGNOSIS — M545 Low back pain: Secondary | ICD-10-CM | POA: Diagnosis not present

## 2017-10-22 NOTE — Therapy (Signed)
[place], [name], [address] Phone: [phone]   Fax:  [phone]  Physical Therapy Treatment  Patient Details  Name: [name] MRN: [id number] Date of Birth: 1946-04-06 Referring Provider: Basil Dess, MD   Encounter Date: 10/22/2017  PT End of Session - 10/22/17 0937    Visit Number  6    Number of Visits  17    Date for PT Re-Evaluation  11/29/17    Authorization Type  MCR    PT Start Time  0935    PT Stop Time  1027    PT Time Calculation (min)  52 min    Activity Tolerance  Patient tolerated treatment well    Behavior During Therapy  [facility] for tasks assessed/performed       Past Medical History:  Diagnosis Date  . Abnormal nuclear stress test 12/29/2014  . Allergy   . Anemia, unspecified   . Arthritis   . Blood transfusion without reported diagnosis    2018  . CAD (coronary artery disease)    sees. Dr. Burt Knack. s/p cath with PCI of RCA (xience des) June 2010. Pt also with LAD and D2 dzs-NL FFR in both areas med Rx  . Carotid artery occlusion   . Cataract    sees Dr. Herbert Deaner   . Depressive disorder, not elsewhere classified   . Duodenal nodule   . Edema   . Fibromyalgia   . Gastric polyps    COLON  . GERD (gastroesophageal reflux disease)   . Glaucoma    narrow angle, sees Dr. Herbert Deaner   . Headache(784.0)   . Hearing loss    left ear  . Heart murmur    questionable  . Hemorrhoids   . HSV (herpes simplex virus) anogenital infection 05/2014  . HTN (hypertension)   . Hyperlipidemia   . Low back pain   . Obesity   . Peripheral vascular disease ([place])   . Stroke [facility])    TIA  "many yrs ago"    . TIA (transient ischemic attack)    also Hx of it.   . Type II or unspecified type diabetes mellitus without mention of complication, not stated as uncontrolled    sees Dr. Carrolyn Meiers     Past Surgical History:  Procedure Laterality Date  . ABDOMINAL HYSTERECTOMY  age 72   TAH and  USO  Leiomyomata  . ANTERIOR CERVICAL CORPECTOMY N/A 06/11/2014   Procedure: ANTERIOR CERVICAL CORPECTOMY CERVICAL SIX; WITH CERVICAL FIVE TO CERVICAL SEVEN ARTHRODESIS;  Surgeon: Ashok Pall, MD;  Location: [place] [facility];  Service: Neurosurgery;  Laterality: N/A;  . BACK SURGERY  2008   lumb fusion  . BREAST BIOPSY Left 01/26/2014   Procedure: EXCISION LEFT BREAST MASS;  Surgeon: Adin Hector, MD;  Location: [place];  Service: General;  Laterality: Left;  . BREAST LUMPECTOMY WITH RADIOACTIVE SEED LOCALIZATION Left 07/24/2017   Procedure: LEFT BREAST LUMPECTOMY WITH RADIOACTIVE SEED LOCALIZATION ERAS;  Surgeon: Erroll Luna, MD;  Location: [place];  Service: General;  Laterality: Left;  . CARDIAC CATHETERIZATION    . CARDIAC CATHETERIZATION N/A 12/29/2014   Procedure: Left Heart Cath and Coronary Angiography;  Surgeon: Sherren Mocha, MD;  Location: [place] CV LAB;  Service: Cardiovascular;  Laterality: N/A;  . CAROTID ENDARTERECTOMY  march 2012   per Dr. Morton Amy  . CARPAL TUNNEL RELEASE Left 05/14/2014   Procedure: Left Carpal tunnel release;  Surgeon: Ashok Pall,  MD;  Location: [place] NEURO ORS;  Service: Neurosurgery;  Laterality: Left;  Left Carpal tunnel release  . CARPAL TUNNEL RELEASE Bilateral   . CHOLECYSTECTOMY    . COLONOSCOPY  05-29-11   per Dr. Henrene Pastor, benign polyps, repeat in 5 yrs    . CORONARY STENT PLACEMENT     Dr. Burt Knack  . DILATION AND CURETTAGE OF UTERUS    . ESOPHAGOGASTRODUODENOSCOPY  02-08-06   per Dr. Henrene Pastor, gastritis   . EYE SURGERY Left June 2016   Cataract  . GLAUCOMA SURGERY     with laser, per Dr. Henrene Pastor   . HARDWARE REMOVAL  10/30/2016   Procedure: HARDWARE REMOVAL;  Surgeon: Jessy Oto, MD;  Location: [place];  Service: Orthopedics;;  . LUMBAR FUSION  2008  . POLYPECTOMY    . POSTERIOR LUMBAR FUSION  10/30/2016   Removal of hardware rods and pedicle screws lumbar four, Extension of fusion L4-5 to L5-S1  with reinsertion of screws at L 4, left L5-S1 transforaminal lumbar interbody fusion, Exploration  left L5 nerve root, bilateral decompressive laminectomy L3-4/notes 10/30/2016  . ULNAR NERVE TRANSPOSITION Left 05/14/2014   Procedure: Left Ulnar nerve decompression;  Surgeon: Ashok Pall, MD;  Location: [place] NEURO ORS;  Service: Neurosurgery;  Laterality: Left;  Left Ulnar nerve decompression    There were no vitals filed for this visit.  Subjective Assessment - 10/22/17 1018    Subjective  Cont to report feeling better . PAin level same as last session    Currently in Pain?  Yes    Pain Score  -- 4.5    Pain Location  -- back and RT hip    Pain Orientation  Right    Pain Descriptors / Indicators  Sore    Pain Type  Chronic pain    Pain Onset  More than a month ago    Pain Frequency  Constant    Aggravating Factors   sit , more in AM       Pain Relieving Factors  stay active                       [facility] Adult PT Treatment/Exercise - 10/22/17 0001      Lumbar Exercises: Stretches   Single Knee to Chest Stretch  Right;Left;2 reps;30 seconds    Pelvic Tilt  Limitations    Pelvic Tilt Limitations  12 reps 5 sec hold    ITB Stretch  Right;1 rep;30 seconds    Piriformis Stretch  Right;1 rep;60 seconds      Lumbar Exercises: Supine   Bridge  10 reps    Other Supine Lumbar Exercises  hip adduction into ball x 20 reps    Other Supine Lumbar Exercises  knee ext to 50 degrees with hold 5 sec x 15 RT /Lt leg then LTR x 15 RT.LT legs on red ball/       Lumbar Exercises: Sidelying   Clam Limitations  15 reps then reverse clam x 15    Hip Abduction  Right;10 reps      Moist Heat Therapy   Number Minutes Moist Heat  12 Minutes    Moist Heat Location  Lumbar Spine      Manual Therapy   Soft tissue mobilization  massage roller to right posteriolateral hip, ITB , rocktool soft tissue work to right Ql and right thoracolumbar paraspinals                PT Short Term  Goals - 10/22/17 0939      PT SHORT TERM GOAL #1   Title  She will be independent with initial HEp    Status  Achieved      PT SHORT TERM GOAL #2   Title  She will report pain decr 30% in back    Baseline  50% per pt    Status  Achieved        PT Long Term Goals - 10/22/17 0940      PT LONG TERM GOAL #1   Title  She will be independnet with all HEP issued    Status  On-going      PT LONG TERM GOAL #2   Title  She will report pain in back decr 50% generally    Baseline  50% per pt today    Status  Achieved      PT LONG TERM GOAL #3   Title  She will report RT hip pain decr 40% or more    Baseline  50%     Status  Achieved      PT LONG TERM GOAL #4   Title  She will be able to walk 300 feet or more without excess fatigue or pain    Status  On-going      PT LONG TERM GOAL #5   Title  She will report greater ease with donning shoes and socks    Baseline  She feels she has less pain    Status  Partially Met            Plan - 10/22/17 0938    Clinical Impression Statement  She reports better but pain level the same as last week. She was able to do all exercise without incr pain today    PT Treatment/Interventions  Electrical Stimulation;Moist Heat;Therapeutic exercise;Patient/family education;Manual techniques;Dry needling;Passive range of motion;Taping    PT Next Visit Plan  HEP , Manual , modalites,  add hamstring stretch/clam and ball squeeze for home, continue ITB stretch and roller     PT Home Exercise Plan  PPT , LTR , lumbar stab 1 except heel slide, piriformis and figure 4 stretch, side lying QL stretch     Consulted and Agree with Plan of Care  Patient       Patient will benefit from skilled therapeutic intervention in order to improve the following deficits and impairments:  Decreased endurance, Decreased activity tolerance, Decreased range of motion, Decreased strength, Pain, Postural dysfunction, Difficulty walking  Visit Diagnosis: Chronic bilateral  low back pain without sciatica  Abnormal posture  Muscle spasm of back  Pain in right hip  Muscle weakness (generalized)     Problem List Patient Active Problem List   Diagnosis Date Noted  . Hyperlipidemia 07/17/2017  . History of lumbar spinal fusion   . History of fusion of lumbar spine   . Anemia due to blood loss 11/02/2016    Class: Acute  . Lethargy 10/31/2016  . AKI (acute kidney injury) ([place]) 10/31/2016  . Chronic diastolic CHF (congestive heart failure) ([place]) 10/31/2016  . Cough   . Leukocytosis   . Spondylolisthesis, lumbar region 10/30/2016    Class: Chronic  . Spinal stenosis of lumbar region 10/30/2016    Class: Chronic  . Retained orthopedic hardware 10/30/2016    Class: Chronic  . Spinal stenosis of lumbar region with neurogenic claudication 10/30/2016  . Genetic testing 09/14/2015  . Atypical ductal hyperplasia of left breast 08/24/2015  . Family history of  breast cancer   . Family history of prostate cancer   . Family history of stomach cancer   . Abnormal nuclear stress test 12/29/2014  . Cervical spondylosis with myelopathy and radiculopathy 06/11/2014  . Carpal tunnel syndrome 05/14/2014  . Aftercare following surgery of the circulatory system, [place] 03/11/2014  . Pain of right lower extremity 03/11/2014  . Atypical lobular hyperplasia of left breast 02/12/2014  . Fibromyalgia 09/21/2013  . Carotid artery disease ([place]) 02/29/2012  . Back abscess 01/22/2012  . Chest pain with moderate risk of acute coronary syndrome 12/09/2009  . Shortness of breath 06/10/2009  . Orthostatic hypotension 01/31/2009  . CAD (coronary artery disease) 12/23/2008  . Normocytic anemia 07/21/2008  . Headache(784.0) 07/21/2008  . History of TIA (transient ischemic attack) 07/21/2008  . Edema 02/27/2008  . Diabetes mellitus with complication ([place]) 72/15/8727  . Depression 07/16/2007  . Dyslipidemia 02/21/2007  . Essential hypertension 02/21/2007  . ASTHMA 02/21/2007   . GERD 02/21/2007  . LOW BACK PAIN 02/21/2007    Darrel Hoover  PT 10/22/2017, 10:23 AM  [facility] [address] [place], [name], [address] Phone: [phone]   Fax:  [phone]  Name: DYNA FIGUEREO MRN: [id number] Date of Birth: 12/05/1945

## 2017-10-24 ENCOUNTER — Ambulatory Visit: Payer: Medicare Other

## 2017-10-24 DIAGNOSIS — R293 Abnormal posture: Secondary | ICD-10-CM | POA: Diagnosis not present

## 2017-10-24 DIAGNOSIS — G8929 Other chronic pain: Secondary | ICD-10-CM

## 2017-10-24 DIAGNOSIS — M6281 Muscle weakness (generalized): Secondary | ICD-10-CM

## 2017-10-24 DIAGNOSIS — M545 Low back pain, unspecified: Secondary | ICD-10-CM

## 2017-10-24 DIAGNOSIS — M25551 Pain in right hip: Secondary | ICD-10-CM

## 2017-10-24 DIAGNOSIS — M62830 Muscle spasm of back: Secondary | ICD-10-CM

## 2017-10-24 NOTE — Therapy (Signed)
[place], [name], [address] Phone: [phone]   Fax:  [phone]  Physical Therapy Treatment  Patient Details  Name: [name] MRN: [id number] Date of Birth: March 04, 1946 Referring Provider: Basil Dess, MD   Encounter Date: 10/24/2017  PT End of Session - 10/24/17 0936    Visit Number  7    Number of Visits  17    Date for PT Re-Evaluation  11/29/17    Authorization Type  MCR    PT Start Time  0935    PT Stop Time  1030    PT Time Calculation (min)  55 min    Activity Tolerance  Patient tolerated treatment well    Behavior During Therapy  [facility] for tasks assessed/performed       Past Medical History:  Diagnosis Date  . Abnormal nuclear stress test 12/29/2014  . Allergy   . Anemia, unspecified   . Arthritis   . Blood transfusion without reported diagnosis    2018  . CAD (coronary artery disease)    sees. Dr. Burt Knack. s/p cath with PCI of RCA (xience des) June 2010. Pt also with LAD and D2 dzs-NL FFR in both areas med Rx  . Carotid artery occlusion   . Cataract    sees Dr. Herbert Deaner   . Depressive disorder, not elsewhere classified   . Duodenal nodule   . Edema   . Fibromyalgia   . Gastric polyps    COLON  . GERD (gastroesophageal reflux disease)   . Glaucoma    narrow angle, sees Dr. Herbert Deaner   . Headache(784.0)   . Hearing loss    left ear  . Heart murmur    questionable  . Hemorrhoids   . HSV (herpes simplex virus) anogenital infection 05/2014  . HTN (hypertension)   . Hyperlipidemia   . Low back pain   . Obesity   . Peripheral vascular disease ([place])   . Stroke [facility])    TIA  "many yrs ago"    . TIA (transient ischemic attack)    also Hx of it.   . Type II or unspecified type diabetes mellitus without mention of complication, not stated as uncontrolled    sees Dr. Carrolyn Meiers     Past Surgical History:  Procedure Laterality Date  . ABDOMINAL HYSTERECTOMY  age 67   TAH and  USO  Leiomyomata  . ANTERIOR CERVICAL CORPECTOMY N/A 06/11/2014   Procedure: ANTERIOR CERVICAL CORPECTOMY CERVICAL SIX; WITH CERVICAL FIVE TO CERVICAL SEVEN ARTHRODESIS;  Surgeon: Ashok Pall, MD;  Location: [place] NEUR[facility] ORS;  Service: Neurosurgery;  Laterality: N/A;  . BACK SURGERY  2008   lumb fusion  . BREAST BIOPSY Left 01/26/2014   Procedure: EXCISION LEFT BREAST MASS;  Surgeon: Adin Hector, MD;  Location: [place];  Service: General;  Laterality: Left;  . BREAST LUMPECTOMY WITH RADIOACTIVE SEED LOCALIZATION Left 07/24/2017   Procedure: LEFT BREAST LUMPECTOMY WITH RADIOACTIVE SEED LOCALIZATION ERAS;  Surgeon: Erroll Luna, MD;  Location: [place];  Service: General;  Laterality: Left;  . CARDIAC CATHETERIZATION    . CARDIAC CATHETERIZATION N/A 12/29/2014   Procedure: Left Heart Cath and Coronary Angiography;  Surgeon: Sherren Mocha, MD;  Location: [place] CV LAB;  Service: Cardiovascular;  Laterality: N/A;  . CAROTID ENDARTERECTOMY  march 2012   per Dr. Morton Amy  . CARPAL TUNNEL RELEASE Left 05/14/2014   Procedure: Left Carpal tunnel release;  Surgeon: Ashok Pall,  MD;  Location: [place] [facility]S;  Service: Neurosurgery;  Laterality: Left;  Left Carpal tunnel release  . CARPAL TUNNEL RELEASE Bilateral   . CHOLECYSTECTOMY    . COLONOSCOPY  05-29-11   per Dr. Henrene Pastor, benign polyps, repeat in 5 yrs    . CORONARY STENT PLACEMENT     Dr. Burt Knack  . DILATION AND CURETTAGE OF UTERUS    . ESOPHAGOGASTRODUODENOSCOPY  02-08-06   per Dr. Henrene Pastor, gastritis   . EYE SURGERY Left June 2016   Cataract  . GLAUCOMA SURGERY     with laser, per Dr. Henrene Pastor   . HARDWARE REMOVAL  10/30/2016   Procedure: HARDWARE REMOVAL;  Surgeon: Jessy Oto, MD;  Location: [place];  Service: Orthopedics;;  . LUMBAR FUSION  2008  . POLYPECTOMY    . POSTERIOR LUMBAR FUSION  10/30/2016   Removal of hardware rods and pedicle screws lumbar four, Extension of fusion L4-5 to L5-S1  with reinsertion of screws at L 4, left L5-S1 transforaminal lumbar interbody fusion, Exploration  left L5 nerve root, bilateral decompressive laminectomy L3-4/notes 10/30/2016  . ULNAR NERVE TRANSPOSITION Left 05/14/2014   Procedure: Left Ulnar nerve decompression;  Surgeon: Ashok Pall, MD;  Location: [place] NEURO ORS;  Service: Neurosurgery;  Laterality: Left;  Left Ulnar nerve decompression    There were no vitals filed for this visit.  Subjective Assessment - 10/24/17 0939    Subjective  Feel better . Early AM pain 1/10 today    How long can you sit comfortably?  as needed    How long can you walk comfortably?  300 feet today    Pain Score  4     Pain Location  Back hip     Pain Orientation  Right    Pain Descriptors / Indicators  Sore    Pain Type  Chronic pain    Pain Radiating Towards  rt hip    Pain Onset  More than a month ago    Pain Frequency  Constant    Aggravating Factors   walk, 2 hours in car    Pain Relieving Factors  gentle activity                       [facility] Adult PT Treatment/Exercise - 10/24/17 0001      Ambulation/Gait   Ambulation Distance (Feet)  300 Feet    Assistive device  Straight cane    Ambulation Surface  Level;Indoor    Gait Comments  also 150 feet with 3/8 inch heel lift in  to attempt to level pelvis She report more comfort with walking      Lumbar Exercises: Stretches   Single Knee to Chest Stretch  Right;Left;60 seconds;1 rep    ITB Stretch  60 seconds;Right    Piriformis Stretch  Right;2 reps;60 seconds      Lumbar Exercises: Aerobic   Other Aerobic Exercise  walked 300 feet without incr back pain and no excess fatigue      Lumbar Exercises: Supine   Bridge  10 reps    Other Supine Lumbar Exercises  hip adduction into ball x 20 reps      Lumbar Exercises: Sidelying   Clam Limitations  2x10 reps    Hip Abduction  Right;10 reps 2 sets       Manual Therapy   Manual Therapy  Passive ROM    Soft tissue mobilization  ball  rolled to right posteriolateral hip, ITB , quad TFL  Passive ROM  hip flexor and quad RT.              PT Education - 10/24/17 1016    Education provided  Yes    Education Details  heel lift in 4 hours  out 4 hours x 2 and assess if helpful . If not remove and if irrtiaitng remove sooner than 4 hours.     Person(s) Educated  Patient    Methods  Explanation    Comprehension  Verbalized understanding       PT Short Term Goals - 10/22/17 0939      PT SHORT TERM GOAL #1   Title  She will be independent with initial HEp    Status  Achieved      PT SHORT TERM GOAL #2   Title  She will report pain decr 30% in back    Baseline  50% per pt    Status  Achieved        PT Long Term Goals - 10/22/17 0940      PT LONG TERM GOAL #1   Title  She will be independnet with all HEP issued    Status  On-going      PT LONG TERM GOAL #2   Title  She will report pain in back decr 50% generally    Baseline  50% per pt today    Status  Achieved      PT LONG TERM GOAL #3   Title  She will report RT hip pain decr 40% or more    Baseline  50%     Status  Achieved      PT LONG TERM GOAL #4   Title  She will be able to walk 300 feet or more without excess fatigue or pain    Status  On-going      PT LONG TERM GOAL #5   Title  She will report greater ease with donning shoes and socks    Baseline  She feels she has less pain    Status  Partially Met            Plan - 10/24/17 0936    Clinical Impression Statement  Tightness of RT hip and back contribute to pain . Heel leift may decrease compression to RT back with walking.   Cont manual          and exer.     PT Treatment/Interventions  Electrical Stimulation;Moist Heat;Therapeutic exercise;Patient/family education;Manual techniques;Dry needling;Passive range of motion;Taping    PT Next Visit Plan  HEP , Manual , modalites,  add hamstring stretch/clam and ball squeeze for home, continue ITB stretch and roller     PT Home  Exercise Plan  PPT , LTR , lumbar stab 1 except heel slide, piriformis and figure 4 stretch, side lying QL stretch     Consulted and Agree with Plan of Care  Patient       Patient will benefit from skilled therapeutic intervention in order to improve the following deficits and impairments:  Decreased endurance, Decreased activity tolerance, Decreased range of motion, Decreased strength, Pain, Postural dysfunction, Difficulty walking  Visit Diagnosis: Chronic bilateral low back pain without sciatica  Abnormal posture  Muscle spasm of back  Pain in right hip  Muscle weakness (generalized)     Problem List Patient Active Problem List   Diagnosis Date Noted  . Hyperlipidemia 07/17/2017  . History of lumbar spinal fusion   . History of fusion of lumbar  spine   . Anemia due to blood loss 11/02/2016    Class: Acute  . Lethargy 10/31/2016  . AKI (acute kidney injury) ([place]) 10/31/2016  . Chronic diastolic CHF (congestive heart failure) ([place]) 10/31/2016  . Cough   . Leukocytosis   . Spondylolisthesis, lumbar region 10/30/2016    Class: Chronic  . Spinal stenosis of lumbar region 10/30/2016    Class: Chronic  . Retained orthopedic hardware 10/30/2016    Class: Chronic  . Spinal stenosis of lumbar region with neurogenic claudication 10/30/2016  . Genetic testing 09/14/2015  . Atypical ductal hyperplasia of left breast 08/24/2015  . Family history of breast cancer   . Family history of prostate cancer   . Family history of stomach cancer   . Abnormal nuclear stress test 12/29/2014  . Cervical spondylosis with myelopathy and radiculopathy 06/11/2014  . Carpal tunnel syndrome 05/14/2014  . Aftercare following surgery of the circulatory system, [place] 03/11/2014  . Pain of right lower extremity 03/11/2014  . Atypical lobular hyperplasia of left breast 02/12/2014  . Fibromyalgia 09/21/2013  . Carotid artery disease ([place]) 02/29/2012  . Back abscess 01/22/2012  . Chest pain with  moderate risk of acute coronary syndrome 12/09/2009  . Shortness of breath 06/10/2009  . Orthostatic hypotension 01/31/2009  . CAD (coronary artery disease) 12/23/2008  . Normocytic anemia 07/21/2008  . Headache(784.0) 07/21/2008  . History of TIA (transient ischemic attack) 07/21/2008  . Edema 02/27/2008  . Diabetes mellitus with complication ([place]) 41/36/4383  . Depression 07/16/2007  . Dyslipidemia 02/21/2007  . Essential hypertension 02/21/2007  . ASTHMA 02/21/2007  . GERD 02/21/2007  . LOW BACK PAIN 02/21/2007    Darrel Hoover  PT 10/24/2017, 10:22 AM  [facility] [address] [place], [name], [address] Phone: [phone]   Fax:  [phone]  Name: [name] MRN: [id number] Date of Birth: 21-Aug-1945

## 2017-10-29 ENCOUNTER — Ambulatory Visit: Payer: Medicare Other

## 2017-10-29 DIAGNOSIS — M6281 Muscle weakness (generalized): Secondary | ICD-10-CM | POA: Diagnosis not present

## 2017-10-29 DIAGNOSIS — M62830 Muscle spasm of back: Secondary | ICD-10-CM

## 2017-10-29 DIAGNOSIS — G8929 Other chronic pain: Secondary | ICD-10-CM

## 2017-10-29 DIAGNOSIS — M545 Low back pain: Principal | ICD-10-CM

## 2017-10-29 DIAGNOSIS — M25551 Pain in right hip: Secondary | ICD-10-CM

## 2017-10-29 DIAGNOSIS — R293 Abnormal posture: Secondary | ICD-10-CM

## 2017-10-29 NOTE — Therapy (Signed)
[place], [name], [address] Phone: [phone]   Fax:  [phone]  Physical Therapy Treatment  Patient Details  Name: [name] MRN: [id number] Date of Birth: 1946/05/08 Referring Provider: Basil Dess, MD   Encounter Date: 10/29/2017  PT End of Session - 10/29/17 1025    Visit Number  8    Number of Visits  17    Date for PT Re-Evaluation  11/29/17    Authorization Type  MCR    PT Start Time  1025 late 10 min    PT Stop Time  1125    PT Time Calculation (min)  60 min    Activity Tolerance  Patient tolerated treatment well    Behavior During Therapy  [facility] for tasks assessed/performed       Past Medical History:  Diagnosis Date  . Abnormal nuclear stress test 12/29/2014  . Allergy   . Anemia, unspecified   . Arthritis   . Blood transfusion without reported diagnosis    2018  . CAD (coronary artery disease)    sees. Dr. Burt Knack. s/p cath with PCI of RCA (xience des) June 2010. Pt also with LAD and D2 dzs-NL FFR in both areas med Rx  . Carotid artery occlusion   . Cataract    sees Dr. Herbert Deaner   . Depressive disorder, not elsewhere classified   . Duodenal nodule   . Edema   . Fibromyalgia   . Gastric polyps    COLON  . GERD (gastroesophageal reflux disease)   . Glaucoma    narrow angle, sees Dr. Herbert Deaner   . Headache(784.0)   . Hearing loss    left ear  . Heart murmur    questionable  . Hemorrhoids   . HSV (herpes simplex virus) anogenital infection 05/2014  . HTN (hypertension)   . Hyperlipidemia   . Low back pain   . Obesity   . Peripheral vascular disease ([place])   . Stroke [facility])    TIA  "many yrs ago"    . TIA (transient ischemic attack)    also Hx of it.   . Type II or unspecified type diabetes mellitus without mention of complication, not stated as uncontrolled    sees Dr. Carrolyn Meiers     Past Surgical History:  Procedure Laterality Date  . ABDOMINAL HYSTERECTOMY  age  58   TAH and USO  Leiomyomata  . ANTERIOR CERVICAL CORPECTOMY N/A 06/11/2014   Procedure: ANTERIOR CERVICAL CORPECTOMY CERVICAL SIX; WITH CERVICAL FIVE TO CERVICAL SEVEN ARTHRODESIS;  Surgeon: Ashok Pall, MD;  Location: [place] [facility];  Service: Neurosurgery;  Laterality: N/A;  . BACK SURGERY  2008   lumb fusion  . BREAST BIOPSY Left 01/26/2014   Procedure: EXCISION LEFT BREAST MASS;  Surgeon: Adin Hector, MD;  Location: [place];  Service: General;  Laterality: Left;  . BREAST LUMPECTOMY WITH RADIOACTIVE SEED LOCALIZATION Left 07/24/2017   Procedure: LEFT BREAST LUMPECTOMY WITH RADIOACTIVE SEED LOCALIZATION ERAS;  Surgeon: Erroll Luna, MD;  Location: [place];  Service: General;  Laterality: Left;  . CARDIAC CATHETERIZATION    . CARDIAC CATHETERIZATION N/A 12/29/2014   Procedure: Left Heart Cath and Coronary Angiography;  Surgeon: Sherren Mocha, MD;  Location: [place] CV LAB;  Service: Cardiovascular;  Laterality: N/A;  . CAROTID ENDARTERECTOMY  march 2012   per Dr. Morton Amy  . CARPAL TUNNEL RELEASE Left 05/14/2014   Procedure: Left Carpal tunnel release;  Surgeon: Ashok Pall, MD;  Location: [place] [facility];  Service: Neurosurgery;  Laterality: Left;  Left Carpal tunnel release  . CARPAL TUNNEL RELEASE Bilateral   . CHOLECYSTECTOMY    . COLONOSCOPY  05-29-11   per Dr. Henrene Pastor, benign polyps, repeat in 5 yrs    . CORONARY STENT PLACEMENT     Dr. Burt Knack  . DILATION AND CURETTAGE OF UTERUS    . ESOPHAGOGASTRODUODENOSCOPY  02-08-06   per Dr. Henrene Pastor, gastritis   . EYE SURGERY Left June 2016   Cataract  . GLAUCOMA SURGERY     with laser, per Dr. Henrene Pastor   . HARDWARE REMOVAL  10/30/2016   Procedure: HARDWARE REMOVAL;  Surgeon: Jessy Oto, MD;  Location: [place];  Service: Orthopedics;;  . LUMBAR FUSION  2008  . POLYPECTOMY    . POSTERIOR LUMBAR FUSION  10/30/2016   Removal of hardware rods and pedicle screws lumbar four, Extension of fusion  L4-5 to L5-S1 with reinsertion of screws at L 4, left L5-S1 transforaminal lumbar interbody fusion, Exploration  left L5 nerve root, bilateral decompressive laminectomy L3-4/notes 10/30/2016  . ULNAR NERVE TRANSPOSITION Left 05/14/2014   Procedure: Left Ulnar nerve decompression;  Surgeon: Ashok Pall, MD;  Location: [place] [facility];  Service: Neurosurgery;  Laterality: Left;  Left Ulnar nerve decompression    There were no vitals filed for this visit.  Subjective Assessment - 10/29/17 1034    Subjective  Rt buttock pain gone since last visit    Pain Score  4     Pain Location  Back    Pain Orientation  Right    Pain Descriptors / Indicators  Sore    Pain Onset  More than a month ago    Pain Frequency  Constant    Aggravating Factors   activity on feet         [facility] PT Assessment - 10/29/17 0001      Observation/Other Assessments   Focus on Therapeutic Outcomes (FOTO)   52% limited  decr by 4 points                   [facility] Adult PT Treatment/Exercise - 10/29/17 0001      Lumbar Exercises: Stretches   Single Knee to Chest Stretch  Right;Left;60 seconds;2 reps    Piriformis Stretch  Right;2 reps;60 seconds    Figure 4 Stretch  2 reps;60 seconds    Figure 4 Stretch Limitations  ER stretching       Lumbar Exercises: Supine   Pelvic Tilt  15 reps;10 seconds    Clam  20 reps;2 seconds rt/lt    Bridge  10 reps very short ROM     Other Supine Lumbar Exercises  hip adduction into ball x 20 reps    Other Supine Lumbar Exercises  knee ext to 50 degrees with hold 3 sec x 20 RT /Lt leg , then LTR x 20 RT.LT legs on red ball/       MHP after exer to lower back        PT Short Term Goals - 10/22/17 0939      PT SHORT TERM GOAL #1   Title  She will be independent with initial HEp    Status  Achieved      PT SHORT TERM GOAL #2   Title  She will report pain decr 30% in back    Baseline  50% per pt    Status  Achieved  PT Long Term Goals - 10/29/17 1118       PT LONG TERM GOAL #1   Title  She will be independent with all HEP issued    Status  On-going      PT LONG TERM GOAL #2   Title  She will report pain in back decr 50% generally    Baseline  50% per pt today    Status  Achieved      PT LONG TERM GOAL #3   Title  She will report RT hip pain decr 40% or more    Baseline  > 50%     Status  Achieved      PT LONG TERM GOAL #4   Title  She will be able to walk 300 feet or more without excess fatigue or pain    Status  Achieved      PT LONG TERM GOAL #5   Title  She will report greater ease with donning shoes and socks    Baseline  She feels she has less pain    Status  Partially Met            Plan - 10/29/17 1026    Clinical Impression Statement  She is improved with no RT hip pain. She is able to do all exercies well with bridge limited by pain.   Will continue with plan for 4-6 more sessions the discharge. Her FOTO score was minimally improved.  Other medical issues may impact progress    PT Treatment/Interventions  Electrical Stimulation;Moist Heat;Therapeutic exercise;Patient/family education;Manual techniques;Dry needling;Passive range of motion;Taping    PT Next Visit Plan  HEP , Manual , modalites,  ADD : hamstring stretch/clam and ball squeeze for home, continue ITB stretch and roller as needed     PT Home Exercise Plan  PPT , LTR , lumbar stab 1 except heel slide, piriformis and figure 4 stretch, side lying QL stretch     Consulted and Agree with Plan of Care  Patient       Patient will benefit from skilled therapeutic intervention in order to improve the following deficits and impairments:  Decreased endurance, Decreased activity tolerance, Decreased range of motion, Decreased strength, Pain, Postural dysfunction, Difficulty walking  Visit Diagnosis: Chronic bilateral low back pain without sciatica  Abnormal posture  Muscle spasm of back  Pain in right hip  Muscle weakness (generalized)     Problem  List Patient Active Problem List   Diagnosis Date Noted  . Hyperlipidemia 07/17/2017  . History of lumbar spinal fusion   . History of fusion of lumbar spine   . Anemia due to blood loss 11/02/2016    Class: Acute  . Lethargy 10/31/2016  . AKI (acute kidney injury) ([place]) 10/31/2016  . Chronic diastolic CHF (congestive heart failure) ([place]) 10/31/2016  . Cough   . Leukocytosis   . Spondylolisthesis, lumbar region 10/30/2016    Class: Chronic  . Spinal stenosis of lumbar region 10/30/2016    Class: Chronic  . Retained orthopedic hardware 10/30/2016    Class: Chronic  . Spinal stenosis of lumbar region with neurogenic claudication 10/30/2016  . Genetic testing 09/14/2015  . Atypical ductal hyperplasia of left breast 08/24/2015  . Family history of breast cancer   . Family history of prostate cancer   . Family history of stomach cancer   . Abnormal nuclear stress test 12/29/2014  . Cervical spondylosis with myelopathy and radiculopathy 06/11/2014  . Carpal tunnel syndrome 05/14/2014  . Aftercare following  surgery of the circulatory system, NEC 03/11/2014  . Pain of right lower extremity 03/11/2014  . Atypical lobular hyperplasia of left breast 02/12/2014  . Fibromyalgia 09/21/2013  . Carotid artery disease ([place]) 02/29/2012  . Back abscess 01/22/2012  . Chest pain with moderate risk of acute coronary syndrome 12/09/2009  . Shortness of breath 06/10/2009  . Orthostatic hypotension 01/31/2009  . CAD (coronary artery disease) 12/23/2008  . Normocytic anemia 07/21/2008  . Headache(784.0) 07/21/2008  . History of TIA (transient ischemic attack) 07/21/2008  . Edema 02/27/2008  . Diabetes mellitus with complication ([place]) 17/40/9927  . Depression 07/16/2007  . Dyslipidemia 02/21/2007  . Essential hypertension 02/21/2007  . ASTHMA 02/21/2007  . GERD 02/21/2007  . LOW BACK PAIN 02/21/2007    Darrel Hoover  PT 10/29/2017, 11:19 AM   [facility] [address] [place], [name], [address] Phone: [phone]   Fax:  [phone]  Name: [name] MRN: [id number] Date of Birth: 02/07/1946

## 2017-10-31 ENCOUNTER — Ambulatory Visit: Payer: Medicare Other

## 2017-10-31 DIAGNOSIS — M545 Low back pain, unspecified: Secondary | ICD-10-CM

## 2017-10-31 DIAGNOSIS — M25551 Pain in right hip: Secondary | ICD-10-CM | POA: Diagnosis not present

## 2017-10-31 DIAGNOSIS — R293 Abnormal posture: Secondary | ICD-10-CM

## 2017-10-31 DIAGNOSIS — M6281 Muscle weakness (generalized): Secondary | ICD-10-CM | POA: Diagnosis not present

## 2017-10-31 DIAGNOSIS — M62830 Muscle spasm of back: Secondary | ICD-10-CM

## 2017-10-31 DIAGNOSIS — G8929 Other chronic pain: Secondary | ICD-10-CM

## 2017-10-31 NOTE — Therapy (Signed)
[place], [name], [address] Phone: [phone]   Fax:  [phone]  Physical Therapy Treatment Progress Note Reporting Period 10/02/17 to 10/31/17  See note below for Objective Data and Assessment of Progress/Goals.      Patient Details  Name: [name] MRN: [id number] Date of Birth: Oct 09, 1945 Referring Provider: Basil Dess, MD   Encounter Date: 10/31/2017  PT End of Session - 10/31/17 1113    Visit Number  9    Number of Visits  17    Date for PT Re-Evaluation  11/29/17    Authorization Type  MCR    PT Start Time  1105    PT Stop Time  1200    PT Time Calculation (min)  55 min    Activity Tolerance  Patient tolerated treatment well    Behavior During Therapy  [facility] for tasks assessed/performed       Past Medical History:  Diagnosis Date  . Abnormal nuclear stress test 12/29/2014  . Allergy   . Anemia, unspecified   . Arthritis   . Blood transfusion without reported diagnosis    2018  . CAD (coronary artery disease)    sees. Dr. Burt Knack. s/p cath with PCI of RCA (xience des) June 2010. Pt also with LAD and D2 dzs-NL FFR in both areas med Rx  . Carotid artery occlusion   . Cataract    sees Dr. Herbert Deaner   . Depressive disorder, not elsewhere classified   . Duodenal nodule   . Edema   . Fibromyalgia   . Gastric polyps    COLON  . GERD (gastroesophageal reflux disease)   . Glaucoma    narrow angle, sees Dr. Herbert Deaner   . Headache(784.0)   . Hearing loss    left ear  . Heart murmur    questionable  . Hemorrhoids   . HSV (herpes simplex virus) anogenital infection 05/2014  . HTN (hypertension)   . Hyperlipidemia   . Low back pain   . Obesity   . Peripheral vascular disease ([place])   . Stroke [facility])    TIA  "many yrs ago"    . TIA (transient ischemic attack)    also Hx of it.   . Type II or unspecified type diabetes mellitus without mention of complication, not stated as  uncontrolled    sees Dr. Carrolyn Meiers     Past Surgical History:  Procedure Laterality Date  . ABDOMINAL HYSTERECTOMY  age 72   TAH and USO  Leiomyomata  . ANTERIOR CERVICAL CORPECTOMY N/A 06/11/2014   Procedure: ANTERIOR CERVICAL CORPECTOMY CERVICAL SIX; WITH CERVICAL FIVE TO CERVICAL SEVEN ARTHRODESIS;  Surgeon: Ashok Pall, MD;  Location: [place] [facility];  Service: Neurosurgery;  Laterality: N/A;  . BACK SURGERY  2008   lumb fusion  . BREAST BIOPSY Left 01/26/2014   Procedure: EXCISION LEFT BREAST MASS;  Surgeon: Adin Hector, MD;  Location: [place];  Service: General;  Laterality: Left;  . BREAST LUMPECTOMY WITH RADIOACTIVE SEED LOCALIZATION Left 07/24/2017   Procedure: LEFT BREAST LUMPECTOMY WITH RADIOACTIVE SEED LOCALIZATION ERAS;  Surgeon: Erroll Luna, MD;  Location: [place];  Service: General;  Laterality: Left;  . CARDIAC CATHETERIZATION    . CARDIAC CATHETERIZATION N/A 12/29/2014   Procedure: Left Heart Cath and Coronary Angiography;  Surgeon: Sherren Mocha, MD;  Location: [place] CV LAB;  Service: Cardiovascular;  Laterality: N/A;  . CAROTID ENDARTERECTOMY  march 2012  per Dr. Morton Amy  . CARPAL TUNNEL RELEASE Left 05/14/2014   Procedure: Left Carpal tunnel release;  Surgeon: Ashok Pall, MD;  Location: [place] [facility];  Service: Neurosurgery;  Laterality: Left;  Left Carpal tunnel release  . CARPAL TUNNEL RELEASE Bilateral   . CHOLECYSTECTOMY    . COLONOSCOPY  05-29-11   per Dr. Henrene Pastor, benign polyps, repeat in 5 yrs    . CORONARY STENT PLACEMENT     Dr. Burt Knack  . DILATION AND CURETTAGE OF UTERUS    . ESOPHAGOGASTRODUODENOSCOPY  02-08-06   per Dr. Henrene Pastor, gastritis   . EYE SURGERY Left June 2016   Cataract  . GLAUCOMA SURGERY     with laser, per Dr. Henrene Pastor   . HARDWARE REMOVAL  10/30/2016   Procedure: HARDWARE REMOVAL;  Surgeon: Jessy Oto, MD;  Location: [place];  Service: Orthopedics;;  . LUMBAR FUSION  2008  .  POLYPECTOMY    . POSTERIOR LUMBAR FUSION  10/30/2016   Removal of hardware rods and pedicle screws lumbar four, Extension of fusion L4-5 to L5-S1 with reinsertion of screws at L 4, left L5-S1 transforaminal lumbar interbody fusion, Exploration  left L5 nerve root, bilateral decompressive laminectomy L3-4/notes 10/30/2016  . ULNAR NERVE TRANSPOSITION Left 05/14/2014   Procedure: Left Ulnar nerve decompression;  Surgeon: Ashok Pall, MD;  Location: [place] [facility];  Service: Neurosurgery;  Laterality: Left;  Left Ulnar nerve decompression    There were no vitals filed for this visit.  Subjective Assessment - 10/31/17 1108    Subjective  3/10 pain in back today    Pain Score  3     Pain Location  Back    Pain Orientation  Right    Pain Descriptors / Indicators  Sore    Pain Type  Chronic pain    Pain Onset  More than a month ago    Pain Frequency  Constant    Aggravating Factors   walking                       [facility] Adult PT Treatment/Exercise - 10/31/17 0001      Lumbar Exercises: Aerobic   Nustep  L4 LE only  5 min      Lumbar Exercises: Supine   Clam  10 reps;Limitations RT/LT  cued for PPT    Clam Limitations  cued to keep PPT but she was able to do this after cue    Bridge  10 reps She reports she cna lift higher    Other Supine Lumbar Exercises  knee ext to 50 degrees with hold 3 sec x 12 with 2 pounds on ankles  RT /Lt leg ,  then  red band clam x 20 with PPT       Moist Heat Therapy   Number Minutes Moist Heat  12 Minutes    Moist Heat Location  Lumbar Spine      Manual Therapy   Soft tissue mobilization               PT Short Term Goals - 10/22/17 1007      PT SHORT TERM GOAL #1   Title  She will be independent with initial HEp    Status  Achieved      PT SHORT TERM GOAL #2   Title  She will report pain decr 30% in back    Baseline  50% per pt    Status  Achieved  PT Long Term Goals - 10/29/17 1118      PT LONG TERM GOAL #1    Title  She will be independent with all HEP issued    Status  On-going      PT LONG TERM GOAL #2   Title  She will report pain in back decr 50% generally    Baseline  50% per pt today    Status  Achieved      PT LONG TERM GOAL #3   Title  She will report RT hip pain decr 40% or more    Baseline  > 50%     Status  Achieved      PT LONG TERM GOAL #4   Title  She will be able to walk 300 feet or more without excess fatigue or pain    Status  Achieved      PT LONG TERM GOAL #5   Title  She will report greater ease with donning shoes and socks    Baseline  She feels she has less pain    Status  Partially Met            Plan - 10/31/17 1114    Clinical Impression Statement  .   If no significant improvement in 3 weeks will discharge.  She is able to lift hips higher without incr pain  She shows better abdominal control with exercises.     PT Frequency  2x / week    PT Duration  3 weeks    PT Treatment/Interventions  Electrical Stimulation;Moist Heat;Therapeutic exercise;Patient/family education;Manual techniques;Dry needling;Passive range of motion;Taping    PT Next Visit Plan  HEP , Manual , modalites,  ADD : hamstring stretch/clam and ball squeeze for home, continue ITB stretch and roller as needed     PT Home Exercise Plan  PPT , LTR , lumbar stab 1 except heel slide, piriformis and figure 4 stretch, side lying QL stretch     Consulted and Agree with Plan of Care  Patient       Patient will benefit from skilled therapeutic intervention in order to improve the following deficits and impairments:  Decreased endurance, Decreased activity tolerance, Decreased range of motion, Decreased strength, Pain, Postural dysfunction, Difficulty walking  Visit Diagnosis: Chronic bilateral low back pain without sciatica  Abnormal posture  Muscle spasm of back  Muscle weakness (generalized)  Pain in right hip     Problem List Patient Active Problem List   Diagnosis Date Noted  .  Hyperlipidemia 07/17/2017  . History of lumbar spinal fusion   . History of fusion of lumbar spine   . Anemia due to blood loss 11/02/2016    Class: Acute  . Lethargy 10/31/2016  . AKI (acute kidney injury) ([place]) 10/31/2016  . Chronic diastolic CHF (congestive heart failure) ([place]) 10/31/2016  . Cough   . Leukocytosis   . Spondylolisthesis, lumbar region 10/30/2016    Class: Chronic  . Spinal stenosis of lumbar region 10/30/2016    Class: Chronic  . Retained orthopedic hardware 10/30/2016    Class: Chronic  . Spinal stenosis of lumbar region with neurogenic claudication 10/30/2016  . Genetic testing 09/14/2015  . Atypical ductal hyperplasia of left breast 08/24/2015  . Family history of breast cancer   . Family history of prostate cancer   . Family history of stomach cancer   . Abnormal nuclear stress test 12/29/2014  . Cervical spondylosis with myelopathy and radiculopathy 06/11/2014  . Carpal tunnel syndrome 05/14/2014  .  Aftercare following surgery of the circulatory system, [place] 03/11/2014  . Pain of right lower extremity 03/11/2014  . Atypical lobular hyperplasia of left breast 02/12/2014  . Fibromyalgia 09/21/2013  . Carotid artery disease ([place]) 02/29/2012  . Back abscess 01/22/2012  . Chest pain with moderate risk of acute coronary syndrome 12/09/2009  . Shortness of breath 06/10/2009  . Orthostatic hypotension 01/31/2009  . CAD (coronary artery disease) 12/23/2008  . Normocytic anemia 07/21/2008  . Headache(784.0) 07/21/2008  . History of TIA (transient ischemic attack) 07/21/2008  . Edema 02/27/2008  . Diabetes mellitus with complication ([place]) 40/98/1191  . Depression 07/16/2007  . Dyslipidemia 02/21/2007  . Essential hypertension 02/21/2007  . ASTHMA 02/21/2007  . GERD 02/21/2007  . LOW BACK PAIN 02/21/2007    Darrel Hoover  PT 10/31/2017, 11:37 AM  [facility] [address] [place], [name],  [address] Phone: [phone]   Fax:  [phone]  Name: BASIA MCGINTY MRN: [id number] Date of Birth: 1946/03/30

## 2017-11-04 ENCOUNTER — Other Ambulatory Visit: Payer: Self-pay | Admitting: Cardiovascular Disease

## 2017-11-04 ENCOUNTER — Other Ambulatory Visit: Payer: Self-pay | Admitting: Physician Assistant

## 2017-11-05 ENCOUNTER — Encounter: Payer: Self-pay | Admitting: Physical Therapy

## 2017-11-05 ENCOUNTER — Ambulatory Visit: Payer: Medicare Other | Admitting: Physical Therapy

## 2017-11-05 DIAGNOSIS — M62830 Muscle spasm of back: Secondary | ICD-10-CM | POA: Diagnosis not present

## 2017-11-05 DIAGNOSIS — M25551 Pain in right hip: Secondary | ICD-10-CM

## 2017-11-05 DIAGNOSIS — R293 Abnormal posture: Secondary | ICD-10-CM | POA: Diagnosis not present

## 2017-11-05 DIAGNOSIS — G8929 Other chronic pain: Secondary | ICD-10-CM | POA: Diagnosis not present

## 2017-11-05 DIAGNOSIS — M545 Low back pain, unspecified: Secondary | ICD-10-CM

## 2017-11-05 DIAGNOSIS — M6281 Muscle weakness (generalized): Secondary | ICD-10-CM | POA: Diagnosis not present

## 2017-11-05 NOTE — Therapy (Signed)
[facility] [address] [place], [place], [address] Phone: [phone]   Fax:  [phone]  Physical Therapy Treatment  Patient Details  Name: [name] MRN: [id number] Date of Birth: 07/30/1945 Referring Provider: James Nitka, MD   Encounter Date: 11/05/2017  PT End of Session - 11/05/17 0946    Visit Number  10    Number of Visits  17    Date for PT Re-Evaluation  11/29/17    Authorization Type  MCR- progress note done on 9th visit     PT Start Time  0936    PT Stop Time  1030    PT Time Calculation (min)  54 min       Past Medical History:  Diagnosis Date  . Abnormal nuclear stress test 12/29/2014  . Allergy   . Anemia, unspecified   . Arthritis   . Blood transfusion without reported diagnosis    2018  . CAD (coronary artery disease)    sees. Dr. Cooper. s/p cath with PCI of RCA (xience des) June 2010. Pt also with LAD and D2 dzs-NL FFR in both areas med Rx  . Carotid artery occlusion   . Cataract    sees Dr. Hecker   . Depressive disorder, not elsewhere classified   . Duodenal nodule   . Edema   . Fibromyalgia   . Gastric polyps    COLON  . GERD (gastroesophageal reflux disease)   . Glaucoma    narrow angle, sees Dr. Hecker   . Headache(784.0)   . Hearing loss    left ear  . Heart murmur    questionable  . Hemorrhoids   . HSV (herpes simplex virus) anogenital infection 05/2014  . HTN (hypertension)   . Hyperlipidemia   . Low back pain   . Obesity   . Peripheral vascular disease (HCC)   . Stroke (HCC)    TIA  "many yrs ago"    . TIA (transient ischemic attack)    also Hx of it.   . Type II or unspecified type diabetes mellitus without mention of complication, not stated as uncontrolled    sees Dr. Steve South     Past Surgical History:  Procedure Laterality Date  . ABDOMINAL HYSTERECTOMY  age 50   TAH and USO  Leiomyomata  . ANTERIOR CERVICAL CORPECTOMY N/A 06/11/2014   Procedure:  ANTERIOR CERVICAL CORPECTOMY CERVICAL SIX; WITH CERVICAL FIVE TO CERVICAL SEVEN ARTHRODESIS;  Surgeon: Kyle Cabbell, MD;  [facility]: [facility];  Service: Neurosurgery;  Laterality: N/A;  . BACK SURGERY  2008   lumb fusion  . BREAST BIOPSY Left 01/26/2014   Procedure: EXCISION LEFT BREAST MASS;  Surgeon: Haywood M Ingram, MD;  Location: [facility];  Service: General;  Laterality: Left;  . BREAST LUMPECTOMY WITH RADIOACTIVE SEED LOCALIZATION Left 07/24/2017   Procedure: LEFT BREAST LUMPECTOMY WITH RADIOACTIVE SEED LOCALIZATION ERAS;  Surgeon: Cornett, Thomas, MD;  Location:  SURGERY CENTER;  Service: General;  Laterality: Left;  . CARDIAC CATHETERIZATION    . CARDIAC CATHETERIZATION N/A 12/29/2014   Procedure: Left Heart Cath and Coronary Angiography;  Surgeon: Michael Cooper, MD;  [facility];  Service: Cardiovascular;  Laterality: N/A;  . CAROTID ENDARTERECTOMY  march 2012   per Dr. Charles Fields-rt  . CARPAL TUNNEL RELEASE Left 05/14/2014   Procedure: Left Carpal tunnel release;  Surgeon: Kyle Cabbell, MD;  [facility]: [facility];  Service: Neurosurgery;  Laterality: Left;  Left   Carpal tunnel release  . CARPAL TUNNEL RELEASE Bilateral   . CHOLECYSTECTOMY    . COLONOSCOPY  05-29-11   per Dr. Perry, benign polyps, repeat in 5 yrs    . CORONARY STENT PLACEMENT     Dr. Cooper  . DILATION AND CURETTAGE OF UTERUS    . ESOPHAGOGASTRODUODENOSCOPY  02-08-06   per Dr. Perry, gastritis   . EYE SURGERY Left June 2016   Cataract  . GLAUCOMA SURGERY     with laser, per Dr. Perry   . HARDWARE REMOVAL  10/30/2016   Procedure: HARDWARE REMOVAL;  Surgeon: James E Nitka, MD;  Location: [facility];  Service: Orthopedics;;  . LUMBAR FUSION  2008  . POLYPECTOMY    . POSTERIOR LUMBAR FUSION  10/30/2016   Removal of hardware rods and pedicle screws lumbar four, Extension of fusion L4-5 to L5-S1 with reinsertion of screws at L 4, left L5-S1 transforaminal lumbar interbody  fusion, Exploration  left L5 nerve root, bilateral decompressive laminectomy L3-4/notes 10/30/2016  . ULNAR NERVE TRANSPOSITION Left 05/14/2014   Procedure: Left Ulnar nerve decompression;  Surgeon: Kyle Cabbell, MD;  [facility];  Service: Neurosurgery;  Laterality: Left;  Left Ulnar nerve decompression    There were no vitals filed for this visit.  Subjective Assessment - 11/05/17 0939    Subjective  3/10. I was feeling better until 2 days ago. Started having some right side pain, not in the buttock.     Currently in Pain?  Yes    Pain Score  3     Pain Location  Back    Pain Orientation  Right    Pain Descriptors / Indicators  Sore                       [facility] Adult PT Treatment/Exercise - 11/05/17 0001      Lumbar Exercises: Stretches   Passive Hamstring Stretch  30 seconds;3 reps    Lower Trunk Rotation  10 seconds    ITB Stretch  Right;2 reps;30 seconds      Lumbar Exercises: Aerobic   Nustep  L5 x 5 min LE only       Lumbar Exercises: Supine   Pelvic Tilt  15 reps;10 seconds    Clam  10 reps;Limitations RT/LT  cued for PPT    Clam Limitations  cued to keep PPT but she was able to do this after cue      Moist Heat Therapy   Number Minutes Moist Heat  15 Minutes    Moist Heat Location  Lumbar Spine      Manual Therapy   Soft tissue mobilization  massage roller to right posteriolateral hip, ITB , rocktool soft tissue work to right Ql and right thoracolumbar paraspinals     Passive ROM  hip flexor and quad RT.                PT Short Term Goals - 10/22/17 0939      PT SHORT TERM GOAL #1   Title  She will be independent with initial HEp    Status  Achieved      PT SHORT TERM GOAL #2   Title  She will report pain decr 30% in back    Baseline  50% per pt    Status  Achieved        PT Long Term Goals - 10/29/17 1118      PT LONG TERM GOAL #1     Title  She will be independent with all HEP issued    Status  On-going      PT LONG  TERM GOAL #2   Title  She will report pain in back decr 50% generally    Baseline  50% per pt today    Status  Achieved      PT LONG TERM GOAL #3   Title  She will report RT hip pain decr 40% or more    Baseline  > 50%     Status  Achieved      PT LONG TERM GOAL #4   Title  She will be able to walk 300 feet or more without excess fatigue or pain    Status  Achieved      PT LONG TERM GOAL #5   Title  She will report greater ease with donning shoes and socks    Baseline  She feels she has less pain    Status  Partially Met            Plan - 11/05/17 1020    Clinical Impression Statement   Pt arrives with reports of return of right sided low back pain. She requests manual treatment which was helpful several visits ago. IASTM and passive ROM/ stretching was the focus of todays treatment to relieve right sided pain.     PT Next Visit Plan  HEP , Manual , modalites,  ADD : hamstring stretch/clam and ball squeeze for home, continue ITB stretch and roller as needed     PT Home Exercise Plan  PPT , LTR , lumbar stab 1 except heel slide, piriformis and figure 4 stretch, side lying QL stretch     Consulted and Agree with Plan of Care  Patient       Patient will benefit from skilled therapeutic intervention in order to improve the following deficits and impairments:  Decreased endurance, Decreased activity tolerance, Decreased range of motion, Decreased strength, Pain, Postural dysfunction, Difficulty walking  Visit Diagnosis: Chronic bilateral low back pain without sciatica  Abnormal posture  Muscle spasm of back  Muscle weakness (generalized)  Pain in right hip     Problem List Patient Active Problem List   Diagnosis Date Noted  . Hyperlipidemia 07/17/2017  . History of lumbar spinal fusion   . History of fusion of lumbar spine   . Anemia due to blood loss 11/02/2016    Class: Acute  . Lethargy 10/31/2016  . AKI (acute kidney injury) (HCC) 10/31/2016  . Chronic  diastolic CHF (congestive heart failure) (HCC) 10/31/2016  . Cough   . Leukocytosis   . Spondylolisthesis, lumbar region 10/30/2016    Class: Chronic  . Spinal stenosis of lumbar region 10/30/2016    Class: Chronic  . Retained orthopedic hardware 10/30/2016    Class: Chronic  . Spinal stenosis of lumbar region with neurogenic claudication 10/30/2016  . Genetic testing 09/14/2015  . Atypical ductal hyperplasia of left breast 08/24/2015  . Family history of breast cancer   . Family history of prostate cancer   . Family history of stomach cancer   . Abnormal nuclear stress test 12/29/2014  . Cervical spondylosis with myelopathy and radiculopathy 06/11/2014  . Carpal tunnel syndrome 05/14/2014  . Aftercare following surgery of the circulatory system, NEC 03/11/2014  . Pain of right lower extremity 03/11/2014  . Atypical lobular hyperplasia of left breast 02/12/2014  . Fibromyalgia 09/21/2013  . Carotid artery disease (HCC) 02/29/2012  . Back abscess 01/22/2012  .   Chest pain with moderate risk of acute coronary syndrome 12/09/2009  . Shortness of breath 06/10/2009  . Orthostatic hypotension 01/31/2009  . CAD (coronary artery disease) 12/23/2008  . Normocytic anemia 07/21/2008  . Headache(784.0) 07/21/2008  . History of TIA (transient ischemic attack) 07/21/2008  . Edema 02/27/2008  . Diabetes mellitus with complication ([place]) 54/27/0623  . Depression 07/16/2007  . Dyslipidemia 02/21/2007  . Essential hypertension 02/21/2007  . ASTHMA 02/21/2007  . GERD 02/21/2007  . LOW BACK PAIN 02/21/2007    Dorene Ar, PTA 11/05/2017, 10:22 AM  [facility] [address] [place], [name], [address] Phone: [phone]   Fax:  [phone]  Name: KLAIR LEISING MRN: [id number] Date of Birth: 1946/07/03

## 2017-11-07 ENCOUNTER — Ambulatory Visit: Payer: Medicare Other | Admitting: Physical Therapy

## 2017-11-08 ENCOUNTER — Other Ambulatory Visit: Payer: Self-pay | Admitting: Physician Assistant

## 2017-11-12 MED ORDER — SPIRONOLACTONE 25 MG PO TABS: 25.0000 mg | ORAL_TABLET | Freq: Every day | ORAL | 2 refills | Status: DC

## 2017-11-12 NOTE — Addendum Note (Signed)
Addended by: Derl Barrow on: 11/12/2017 09:04 AM   Modules accepted: Orders

## 2017-11-15 ENCOUNTER — Ambulatory Visit: Payer: Medicare Other | Attending: Specialist | Admitting: Physical Therapy

## 2017-11-15 ENCOUNTER — Encounter: Payer: Self-pay | Admitting: Physical Therapy

## 2017-11-15 DIAGNOSIS — M62830 Muscle spasm of back: Secondary | ICD-10-CM | POA: Diagnosis not present

## 2017-11-15 DIAGNOSIS — M6281 Muscle weakness (generalized): Secondary | ICD-10-CM | POA: Diagnosis not present

## 2017-11-15 DIAGNOSIS — M25551 Pain in right hip: Secondary | ICD-10-CM | POA: Diagnosis not present

## 2017-11-15 DIAGNOSIS — M545 Low back pain, unspecified: Secondary | ICD-10-CM

## 2017-11-15 DIAGNOSIS — G8929 Other chronic pain: Secondary | ICD-10-CM | POA: Insufficient documentation

## 2017-11-15 DIAGNOSIS — R293 Abnormal posture: Secondary | ICD-10-CM | POA: Insufficient documentation

## 2017-11-15 NOTE — Therapy (Signed)
[place], [name], [address] Phone: [phone]   Fax:  [phone]  Physical Therapy Treatment  Patient Details  Name: [name] MRN: [id number] Date of Birth: Nov 02, 1945 Referring Provider: Basil Dess, MD   Encounter Date: 11/15/2017  PT End of Session - 11/15/17 0819    Visit Number  11    Number of Visits  17    Date for PT Re-Evaluation  11/29/17    Authorization Type  MCR- progress note done on 9th visit     PT Start Time  0815 15 minutes late     PT Stop Time  0900    PT Time Calculation (min)  45 min       Past Medical History:  Diagnosis Date  . Abnormal nuclear stress test 12/29/2014  . Allergy   . Anemia, unspecified   . Arthritis   . Blood transfusion without reported diagnosis    2018  . CAD (coronary artery disease)    sees. Dr. Burt Knack. s/p cath with PCI of RCA (xience des) June 2010. Pt also with LAD and D2 dzs-NL FFR in both areas med Rx  . Carotid artery occlusion   . Cataract    sees Dr. Herbert Deaner   . Depressive disorder, not elsewhere classified   . Duodenal nodule   . Edema   . Fibromyalgia   . Gastric polyps    COLON  . GERD (gastroesophageal reflux disease)   . Glaucoma    narrow angle, sees Dr. Herbert Deaner   . Headache(784.0)   . Hearing loss    left ear  . Heart murmur    questionable  . Hemorrhoids   . HSV (herpes simplex virus) anogenital infection 05/2014  . HTN (hypertension)   . Hyperlipidemia   . Low back pain   . Obesity   . Peripheral vascular disease ([place])   . Stroke [facility])    TIA  "many yrs ago"    . TIA (transient ischemic attack)    also Hx of it.   . Type II or unspecified type diabetes mellitus without mention of complication, not stated as uncontrolled    sees Dr. Carrolyn Meiers     Past Surgical History:  Procedure Laterality Date  . ABDOMINAL HYSTERECTOMY  age 21   TAH and USO  Leiomyomata  . ANTERIOR CERVICAL CORPECTOMY N/A 06/11/2014   Procedure: ANTERIOR CERVICAL CORPECTOMY CERVICAL SIX; WITH CERVICAL FIVE TO CERVICAL SEVEN ARTHRODESIS;  Surgeon: Ashok Pall, MD;  Location: [facility];  Service: Neurosurgery;  Laterality: N/A;  . BACK SURGERY  2008   lumb fusion  . BREAST BIOPSY Left 01/26/2014   Procedure: EXCISION LEFT BREAST MASS;  Surgeon: Adin Hector, MD;  Location: [facility];  Service: General;  Laterality: Left;  . BREAST LUMPECTOMY WITH RADIOACTIVE SEED LOCALIZATION Left 07/24/2017   Procedure: LEFT BREAST LUMPECTOMY WITH RADIOACTIVE SEED LOCALIZATION ERAS;  Surgeon: Erroll Luna, MD;  Location: [facility];  Service: General;  Laterality: Left;  . CARDIAC CATHETERIZATION    . CARDIAC CATHETERIZATION N/A 12/29/2014   Procedure: Left Heart Cath and Coronary Angiography;  Surgeon: Sherren Mocha, MD;  Location: [facility];  Service: Cardiovascular;  Laterality: N/A;  . CAROTID ENDARTERECTOMY  march 2012   per Dr. Morton Amy  . CARPAL TUNNEL RELEASE Left 05/14/2014   Procedure: Left Carpal tunnel release;  Surgeon: Ashok Pall, MD;  Location: [facility];  Service: Neurosurgery;  Laterality:  Left;  Left Carpal tunnel release  . CARPAL TUNNEL RELEASE Bilateral   . CHOLECYSTECTOMY    . COLONOSCOPY  05-29-11   per Dr. Henrene Pastor, benign polyps, repeat in 5 yrs    . CORONARY STENT PLACEMENT     Dr. Burt Knack  . DILATION AND CURETTAGE OF UTERUS    . ESOPHAGOGASTRODUODENOSCOPY  02-08-06   per Dr. Henrene Pastor, gastritis   . EYE SURGERY Left June 2016   Cataract  . GLAUCOMA SURGERY     with laser, per Dr. Henrene Pastor   . HARDWARE REMOVAL  10/30/2016   Procedure: HARDWARE REMOVAL;  Surgeon: Jessy Oto, MD;  Location: [facility];  Service: Orthopedics;;  . LUMBAR FUSION  2008  . POLYPECTOMY    . POSTERIOR LUMBAR FUSION  10/30/2016   Removal of hardware rods and pedicle screws lumbar four, Extension of fusion L4-5 to L5-S1 with reinsertion of screws at L 4, left L5-S1 transforaminal lumbar  interbody fusion, Exploration  left L5 nerve root, bilateral decompressive laminectomy L3-4/notes 10/30/2016  . ULNAR NERVE TRANSPOSITION Left 05/14/2014   Procedure: Left Ulnar nerve decompression;  Surgeon: Ashok Pall, MD;  Location: [facility];  Service: Neurosurgery;  Laterality: Left;  Left Ulnar nerve decompression    There were no vitals filed for this visit.                    [facility] Adult PT Treatment/Exercise - 11/15/17 0001      Lumbar Exercises: Stretches   Piriformis Stretch  3 reps;30 seconds    Other Lumbar Stretch Exercise  seated childs pose forward and lateral using physioball     Other Lumbar Stretch Exercise  QL stretch/ IT B stretch during ,manual       Lumbar Exercises: Aerobic   Nustep  L5 x 5 min LE only       Moist Heat Therapy   Number Minutes Moist Heat  15 Minutes    Moist Heat Location  Lumbar Spine      Manual Therapy   Soft tissue mobilization  tennis ball TPR to right glut med               PT Short Term Goals - 10/22/17 5009      PT SHORT TERM GOAL #1   Title  She will be independent with initial HEp    Status  Achieved      PT SHORT TERM GOAL #2   Title  She will report pain decr 30% in back    Baseline  50% per pt    Status  Achieved        PT Long Term Goals - 10/29/17 1118      PT LONG TERM GOAL #1   Title  She will be independent with all HEP issued    Status  On-going      PT LONG TERM GOAL #2   Title  She will report pain in back decr 50% generally    Baseline  50% per pt today    Status  Achieved      PT LONG TERM GOAL #3   Title  She will report RT hip pain decr 40% or more    Baseline  > 50%     Status  Achieved      PT LONG TERM GOAL #4   Title  She will be able to walk 300 feet or more without excess fatigue or pain    Status  Achieved  PT LONG TERM GOAL #5   Title  She will report greater ease with donning shoes and socks    Baseline  She feels she has less pain    Status   Partially Met            Plan - 11/15/17 0819    Clinical Impression Statement  Short session as pt was late. Pt reports right sided pain over the last two days up to 5/10, better today.  Overall doing better. Able to reach left foot for don and doffing shoes. Able to ER right leg to get foot on knee for right shoe don/doffing. Began seated childs pose with physioball adding laterals. Repeated manual to Right lumbar /right glut med including Trigger point release. Less tenderness post treatment.     PT Next Visit Plan  HEP , Manual , modalites,  ADD : hamstring stretch/clam and ball squeeze for home, continue ITB stretch and roller as needed     PT Home Exercise Plan  PPT , LTR , lumbar stab 1 except heel slide, piriformis and figure 4 stretch, side lying QL stretch     Consulted and Agree with Plan of Care  Patient       Patient will benefit from skilled therapeutic intervention in order to improve the following deficits and impairments:  Decreased endurance, Decreased activity tolerance, Decreased range of motion, Decreased strength, Pain, Postural dysfunction, Difficulty walking  Visit Diagnosis: Chronic bilateral low back pain without sciatica  Abnormal posture  Muscle spasm of back  Muscle weakness (generalized)  Pain in right hip     Problem List Patient Active Problem List   Diagnosis Date Noted  . Hyperlipidemia 07/17/2017  . History of lumbar spinal fusion   . History of fusion of lumbar spine   . Anemia due to blood loss 11/02/2016    Class: Acute  . Lethargy 10/31/2016  . AKI (acute kidney injury) ([place]) 10/31/2016  . Chronic diastolic CHF (congestive heart failure) ([place]) 10/31/2016  . Cough   . Leukocytosis   . Spondylolisthesis, lumbar region 10/30/2016    Class: Chronic  . Spinal stenosis of lumbar region 10/30/2016    Class: Chronic  . Retained orthopedic hardware 10/30/2016    Class: Chronic  . Spinal stenosis of lumbar region with neurogenic  claudication 10/30/2016  . Genetic testing 09/14/2015  . Atypical ductal hyperplasia of left breast 08/24/2015  . Family history of breast cancer   . Family history of prostate cancer   . Family history of stomach cancer   . Abnormal nuclear stress test 12/29/2014  . Cervical spondylosis with myelopathy and radiculopathy 06/11/2014  . Carpal tunnel syndrome 05/14/2014  . Aftercare following surgery of the circulatory system, [place] 03/11/2014  . Pain of right lower extremity 03/11/2014  . Atypical lobular hyperplasia of left breast 02/12/2014  . Fibromyalgia 09/21/2013  . Carotid artery disease ([place]) 02/29/2012  . Back abscess 01/22/2012  . Chest pain with moderate risk of acute coronary syndrome 12/09/2009  . Shortness of breath 06/10/2009  . Orthostatic hypotension 01/31/2009  . CAD (coronary artery disease) 12/23/2008  . Normocytic anemia 07/21/2008  . Headache(784.0) 07/21/2008  . History of TIA (transient ischemic attack) 07/21/2008  . Edema 02/27/2008  . Diabetes mellitus with complication ([place]) 91/50/5697  . Depression 07/16/2007  . Dyslipidemia 02/21/2007  . Essential hypertension 02/21/2007  . ASTHMA 02/21/2007  . GERD 02/21/2007  . LOW BACK PAIN 02/21/2007    Dorene Ar, PTA 11/15/2017, 9:01 AM  [facility] [address] [place], [name], [address] Phone: [phone]   Fax:  [phone]  Name: [name] MRN: [id number] Date of Birth: [birth year]

## 2017-11-18 ENCOUNTER — Telehealth (INDEPENDENT_AMBULATORY_CARE_PROVIDER_SITE_OTHER): Payer: Self-pay | Admitting: Specialist

## 2017-11-18 NOTE — Telephone Encounter (Signed)
I put her on the cancellation list 

## 2017-11-18 NOTE — Telephone Encounter (Signed)
Can you add patient to cancellation list? # [phone]

## 2017-11-19 ENCOUNTER — Ambulatory Visit: Payer: Medicare Other

## 2017-11-19 DIAGNOSIS — M62830 Muscle spasm of back: Secondary | ICD-10-CM | POA: Diagnosis not present

## 2017-11-19 DIAGNOSIS — M6281 Muscle weakness (generalized): Secondary | ICD-10-CM

## 2017-11-19 DIAGNOSIS — R293 Abnormal posture: Secondary | ICD-10-CM

## 2017-11-19 DIAGNOSIS — H6123 Impacted cerumen, bilateral: Secondary | ICD-10-CM | POA: Diagnosis not present

## 2017-11-19 DIAGNOSIS — G8929 Other chronic pain: Secondary | ICD-10-CM | POA: Diagnosis not present

## 2017-11-19 DIAGNOSIS — M25551 Pain in right hip: Secondary | ICD-10-CM | POA: Diagnosis not present

## 2017-11-19 DIAGNOSIS — M545 Low back pain: Principal | ICD-10-CM

## 2017-11-19 NOTE — Therapy (Addendum)
[place], [name], [address] Phone: [phone]   Fax:  [phone]  Physical Therapy Treatment/Discharge  Patient Details  Name: [name] MRN: [id number] Date of Birth: 05/08/1946 Referring Provider: Basil Dess, MD   Encounter Date: 11/19/2017  PT End of Session - 11/19/17 1034    Visit Number  12    Number of Visits  17    Date for PT Re-Evaluation  11/29/17    Authorization Type  MCR- progress note done on 9th visit     PT Start Time  1030 15 min late    PT Stop Time  1100    PT Time Calculation (min)  30 min    Activity Tolerance  Treatment limited secondary to medical complications (Comment)    Behavior During Therapy  [facility] for tasks assessed/performed       Past Medical History:  Diagnosis Date  . Abnormal nuclear stress test 12/29/2014  . Allergy   . Anemia, unspecified   . Arthritis   . Blood transfusion without reported diagnosis    2018  . CAD (coronary artery disease)    sees. Dr. Burt Knack. s/p cath with PCI of RCA (xience des) June 2010. Pt also with LAD and D2 dzs-NL FFR in both areas med Rx  . Carotid artery occlusion   . Cataract    sees Dr. Herbert Deaner   . Depressive disorder, not elsewhere classified   . Duodenal nodule   . Edema   . Fibromyalgia   . Gastric polyps    COLON  . GERD (gastroesophageal reflux disease)   . Glaucoma    narrow angle, sees Dr. Herbert Deaner   . Headache(784.0)   . Hearing loss    left ear  . Heart murmur    questionable  . Hemorrhoids   . HSV (herpes simplex virus) anogenital infection 05/2014  . HTN (hypertension)   . Hyperlipidemia   . Low back pain   . Obesity   . Peripheral vascular disease ([place])   . Stroke [facility])    TIA  "many yrs ago"    . TIA (transient ischemic attack)    also Hx of it.   . Type II or unspecified type diabetes mellitus without mention of complication, not stated as uncontrolled    sees Dr. Carrolyn Meiers     Past  Surgical History:  Procedure Laterality Date  . ABDOMINAL HYSTERECTOMY  age 75   TAH and USO  Leiomyomata  . ANTERIOR CERVICAL CORPECTOMY N/A 06/11/2014   Procedure: ANTERIOR CERVICAL CORPECTOMY CERVICAL SIX; WITH CERVICAL FIVE TO CERVICAL SEVEN ARTHRODESIS;  Surgeon: Ashok Pall, MD;  Location: [place] [facility];  Service: Neurosurgery;  Laterality: N/A;  . BACK SURGERY  2008   lumb fusion  . BREAST BIOPSY Left 01/26/2014   Procedure: EXCISION LEFT BREAST MASS;  Surgeon: Adin Hector, MD;  Location: [place];  Service: General;  Laterality: Left;  . BREAST LUMPECTOMY WITH RADIOACTIVE SEED LOCALIZATION Left 07/24/2017   Procedure: LEFT BREAST LUMPECTOMY WITH RADIOACTIVE SEED LOCALIZATION ERAS;  Surgeon: Erroll Luna, MD;  Location: [place];  Service: General;  Laterality: Left;  . CARDIAC CATHETERIZATION    . CARDIAC CATHETERIZATION N/A 12/29/2014   Procedure: Left Heart Cath and Coronary Angiography;  Surgeon: Sherren Mocha, MD;  Location: [place] CV LAB;  Service: Cardiovascular;  Laterality: N/A;  . CAROTID ENDARTERECTOMY  march 2012   per Dr. Morton Amy  . CARPAL TUNNEL RELEASE  Left 05/14/2014   Procedure: Left Carpal tunnel release;  Surgeon: Ashok Pall, MD;  Location: [place] [facility];  Service: Neurosurgery;  Laterality: Left;  Left Carpal tunnel release  . CARPAL TUNNEL RELEASE Bilateral   . CHOLECYSTECTOMY    . COLONOSCOPY  05-29-11   per Dr. Henrene Pastor, benign polyps, repeat in 5 yrs    . CORONARY STENT PLACEMENT     Dr. Burt Knack  . DILATION AND CURETTAGE OF UTERUS    . ESOPHAGOGASTRODUODENOSCOPY  02-08-06   per Dr. Henrene Pastor, gastritis   . EYE SURGERY Left June 2016   Cataract  . GLAUCOMA SURGERY     with laser, per Dr. Henrene Pastor   . HARDWARE REMOVAL  10/30/2016   Procedure: HARDWARE REMOVAL;  Surgeon: Jessy Oto, MD;  Location: [place];  Service: Orthopedics;;  . LUMBAR FUSION  2008  . POLYPECTOMY    . POSTERIOR LUMBAR FUSION  10/30/2016    Removal of hardware rods and pedicle screws lumbar four, Extension of fusion L4-5 to L5-S1 with reinsertion of screws at L 4, left L5-S1 transforaminal lumbar interbody fusion, Exploration  left L5 nerve root, bilateral decompressive laminectomy L3-4/notes 10/30/2016  . ULNAR NERVE TRANSPOSITION Left 05/14/2014   Procedure: Left Ulnar nerve decompression;  Surgeon: Ashok Pall, MD;  Location: [place] [facility];  Service: Neurosurgery;  Laterality: Left;  Left Ulnar nerve decompression    There were no vitals filed for this visit.  Subjective Assessment - 11/19/17 1032    Subjective  Hearing in Lt ear decreased this week end. She will see MD today.   Had to chase dog today . Very good day as far as pain . Yesterday did have pain.     Currently in Pain?  No/denies                       [facility] Adult PT Treatment/Exercise - 11/19/17 0001      Lumbar Exercises: Stretches   Single Knee to Chest Stretch  Left;Right;30 seconds;2 reps    Pelvic Tilt  20 reps;5 seconds    Piriformis Stretch  Left;Right;2 reps;30 seconds      Lumbar Exercises: Aerobic   Nustep  L4 LE 5 min      Lumbar Exercises: Supine   Bent Knee Raise  15 reps    Dead Bug  15 reps    Bridge  10 reps    Other Supine Lumbar Exercises  leg extension then bend and lower foot x 15 RT/LT  then knee to chest 30 sec x 2 RT/LT               PT Short Term Goals - 10/22/17 7353      PT SHORT TERM GOAL #1   Title  She will be independent with initial HEp    Status  Achieved      PT SHORT TERM GOAL #2   Title  She will report pain decr 30% in back    Baseline  50% per pt    Status  Achieved        PT Long Term Goals - 10/29/17 1118      PT LONG TERM GOAL #1   Title  She will be independent with all HEP issued    Status  On-going      PT LONG TERM GOAL #2   Title  She will report pain in back decr 50% generally    Baseline  50% per pt today  Status  Achieved      PT LONG TERM GOAL #3   Title  She  will report RT hip pain decr 40% or more    Baseline  > 50%     Status  Achieved      PT LONG TERM GOAL #4   Title  She will be able to walk 300 feet or more without excess fatigue or pain    Status  Achieved      PT LONG TERM GOAL #5   Title  She will report greater ease with donning shoes and socks    Baseline  She feels she has less pain    Status  Partially Met            Plan - 11/19/17 1036    Clinical Impression Statement  Pt late , short session. Good day today.  No pain post . Possible discharge next visit    PT Treatment/Interventions  Electrical Stimulation;Moist Heat;Therapeutic exercise;Patient/family education;Manual techniques;Dry needling;Passive range of motion;Taping    PT Next Visit Plan  HEP , Manual , modalites,  ADD : hamstring stretch/clam and ball squeeze for home, continue ITB stretch and roller as needed     PT Home Exercise Plan  PPT , LTR , lumbar stab 1 except heel slide, piriformis and figure 4 stretch, side lying QL stretch     Consulted and Agree with Plan of Care  Patient       Patient will benefit from skilled therapeutic intervention in order to improve the following deficits and impairments:  Decreased endurance, Decreased activity tolerance, Decreased range of motion, Decreased strength, Pain, Postural dysfunction, Difficulty walking  Visit Diagnosis: Chronic bilateral low back pain without sciatica  Abnormal posture  Muscle spasm of back  Muscle weakness (generalized)     Problem List Patient Active Problem List   Diagnosis Date Noted  . Hyperlipidemia 07/17/2017  . History of lumbar spinal fusion   . History of fusion of lumbar spine   . Anemia due to blood loss 11/02/2016    Class: Acute  . Lethargy 10/31/2016  . AKI (acute kidney injury) ([place]) 10/31/2016  . Chronic diastolic CHF (congestive heart failure) ([place]) 10/31/2016  . Cough   . Leukocytosis   . Spondylolisthesis, lumbar region 10/30/2016    Class: Chronic  .  Spinal stenosis of lumbar region 10/30/2016    Class: Chronic  . Retained orthopedic hardware 10/30/2016    Class: Chronic  . Spinal stenosis of lumbar region with neurogenic claudication 10/30/2016  . Genetic testing 09/14/2015  . Atypical ductal hyperplasia of left breast 08/24/2015  . Family history of breast cancer   . Family history of prostate cancer   . Family history of stomach cancer   . Abnormal nuclear stress test 12/29/2014  . Cervical spondylosis with myelopathy and radiculopathy 06/11/2014  . Carpal tunnel syndrome 05/14/2014  . Aftercare following surgery of the circulatory system, [place] 03/11/2014  . Pain of right lower extremity 03/11/2014  . Atypical lobular hyperplasia of left breast 02/12/2014  . Fibromyalgia 09/21/2013  . Carotid artery disease ([place]) 02/29/2012  . Back abscess 01/22/2012  . Chest pain with moderate risk of acute coronary syndrome 12/09/2009  . Shortness of breath 06/10/2009  . Orthostatic hypotension 01/31/2009  . CAD (coronary artery disease) 12/23/2008  . Normocytic anemia 07/21/2008  . Headache(784.0) 07/21/2008  . History of TIA (transient ischemic attack) 07/21/2008  . Edema 02/27/2008  . Diabetes mellitus with complication ([place]) 01/00/7121  . Depression 07/16/2007  .  Dyslipidemia 02/21/2007  . Essential hypertension 02/21/2007  . ASTHMA 02/21/2007  . GERD 02/21/2007  . LOW BACK PAIN 02/21/2007    Darrel Hoover  PT 11/19/2017, 11:05 AM  [facility] [address] [place], [name], [address] Phone: [phone]   Fax:  [phone]  Name: [name] MRN: [id number] Date of Birth: [birth year]  PHYSICAL THERAPY DISCHARGE SUMMARY  Visits from Start of Care: 12  Current functional level related to goals / functional outcomes: See above   Remaining deficits: See above   Education / Equipment: HEP  Plan:                                                    Patient goals  were partially met. Patient is being discharged due to not returning since the last visit.  ?????   Pearson Forster PT 01/13/2018

## 2017-11-21 ENCOUNTER — Ambulatory Visit: Payer: Medicare Other

## 2017-11-29 ENCOUNTER — Encounter (HOSPITAL_COMMUNITY): Payer: Self-pay | Admitting: Emergency Medicine

## 2017-11-29 ENCOUNTER — Ambulatory Visit (HOSPITAL_COMMUNITY)
Admission: EM | Admit: 2017-11-29 | Discharge: 2017-11-29 | Disposition: A | Discharge status: Home / Self Care | Payer: Medicare Other | Attending: Family Medicine | Admitting: Family Medicine

## 2017-11-29 ENCOUNTER — Encounter

## 2017-11-29 DIAGNOSIS — L0291 Cutaneous abscess, unspecified: Secondary | ICD-10-CM

## 2017-11-29 MED ORDER — DOXYCYCLINE HYCLATE 100 MG PO CAPS: 100.0000 mg | ORAL_CAPSULE | Freq: Two times a day (BID) | ORAL | 0 refills | Status: DC

## 2017-11-29 MED ORDER — FLUCONAZOLE 200 MG PO TABS: 200.0000 mg | ORAL_TABLET | Freq: Every day | ORAL | 1 refills | Status: AC

## 2017-11-29 NOTE — ED Provider Notes (Signed)
[name]    CSN: [id number] Arrival date & time: 11/29/17  1319     History   Chief Complaint Chief Complaint  Patient presents with  . Abscess    HPI [name] is a 72 y.o. female.   Patient has history of recurrent vaginal abscesses.  She tells me there is one forming now.  Also complains of itching and some whitish discharge when she wipes her perineum.  HPI  Past Medical History:  Diagnosis Date  . Abnormal nuclear stress test 12/29/2014  . Allergy   . Anemia, unspecified   . Arthritis   . Blood transfusion without reported diagnosis    2018  . CAD (coronary artery disease)    sees. Dr. Burt Knack. s/p cath with PCI of RCA (xience des) June 2010. Pt also with LAD and D2 dzs-NL FFR in both areas med Rx  . Carotid artery occlusion   . Cataract    sees Dr. Herbert Deaner   . Depressive disorder, not elsewhere classified   . Duodenal nodule   . Edema   . Fibromyalgia   . Gastric polyps    COLON  . GERD (gastroesophageal reflux disease)   . Glaucoma    narrow angle, sees Dr. Herbert Deaner   . Headache(784.0)   . Hearing loss    left ear  . Heart murmur    questionable  . Hemorrhoids   . HSV (herpes simplex virus) anogenital infection 05/2014  . HTN (hypertension)   . Hyperlipidemia   . Low back pain   . Obesity   . Peripheral vascular disease ([place])   . Stroke [facility])    TIA  "many yrs ago"    . TIA (transient ischemic attack)    also Hx of it.   . Type II or unspecified type diabetes mellitus without mention of complication, not stated as uncontrolled    sees Dr. Carrolyn Meiers     Patient Active Problem List   Diagnosis Date Noted  . Hyperlipidemia 07/17/2017  . History of lumbar spinal fusion   . History of fusion of lumbar spine   . Anemia due to blood loss 11/02/2016    Class: Acute  . Lethargy 10/31/2016  . AKI (acute kidney injury) ([place]) 10/31/2016  . Chronic diastolic CHF (congestive heart failure) ([place]) 10/31/2016  . Cough   . Leukocytosis    . Spondylolisthesis, lumbar region 10/30/2016    Class: Chronic  . Spinal stenosis of lumbar region 10/30/2016    Class: Chronic  . Retained orthopedic hardware 10/30/2016    Class: Chronic  . Spinal stenosis of lumbar region with neurogenic claudication 10/30/2016  . Genetic testing 09/14/2015  . Atypical ductal hyperplasia of left breast 08/24/2015  . Family history of breast cancer   . Family history of prostate cancer   . Family history of stomach cancer   . Abnormal nuclear stress test 12/29/2014  . Cervical spondylosis with myelopathy and radiculopathy 06/11/2014  . Carpal tunnel syndrome 05/14/2014  . Aftercare following surgery of the circulatory system, [place] 03/11/2014  . Pain of right lower extremity 03/11/2014  . Atypical lobular hyperplasia of left breast 02/12/2014  . Fibromyalgia 09/21/2013  . Carotid artery disease ([place]) 02/29/2012  . Back abscess 01/22/2012  . Chest pain with moderate risk of acute coronary syndrome 12/09/2009  . Shortness of breath 06/10/2009  . Orthostatic hypotension 01/31/2009  . CAD (coronary artery disease) 12/23/2008  . Normocytic anemia 07/21/2008  . Headache(784.0) 07/21/2008  . History of TIA (  transient ischemic attack) 07/21/2008  . Edema 02/27/2008  . Diabetes mellitus with complication ([place]) 83/15/1761  . Depression 07/16/2007  . Dyslipidemia 02/21/2007  . Essential hypertension 02/21/2007  . ASTHMA 02/21/2007  . GERD 02/21/2007  . LOW BACK PAIN 02/21/2007    Past Surgical History:  Procedure Laterality Date  . ABDOMINAL HYSTERECTOMY  age 72   TAH and USO  Leiomyomata  . ANTERIOR CERVICAL CORPECTOMY N/A 06/11/2014   Procedure: ANTERIOR CERVICAL CORPECTOMY CERVICAL SIX; WITH CERVICAL FIVE TO CERVICAL SEVEN ARTHRODESIS;  Surgeon: Ashok Pall, MD;  Location: [place] [facility];  Service: Neurosurgery;  Laterality: N/A;  . BACK SURGERY  2008   lumb fusion  . BREAST BIOPSY Left 01/26/2014   Procedure: EXCISION LEFT BREAST MASS;   Surgeon: Adin Hector, MD;  Location: [place];  Service: General;  Laterality: Left;  . BREAST LUMPECTOMY WITH RADIOACTIVE SEED LOCALIZATION Left 07/24/2017   Procedure: LEFT BREAST LUMPECTOMY WITH RADIOACTIVE SEED LOCALIZATION ERAS;  Surgeon: Erroll Luna, MD;  Location: [place];  Service: General;  Laterality: Left;  . CARDIAC CATHETERIZATION    . CARDIAC CATHETERIZATION N/A 12/29/2014   Procedure: Left Heart Cath and Coronary Angiography;  Surgeon: Sherren Mocha, MD;  Location: [place] CV LAB;  Service: Cardiovascular;  Laterality: N/A;  . CAROTID ENDARTERECTOMY  march 2012   per Dr. Morton Amy  . CARPAL TUNNEL RELEASE Left 05/14/2014   Procedure: Left Carpal tunnel release;  Surgeon: Ashok Pall, MD;  Location: [place] [facility];  Service: Neurosurgery;  Laterality: Left;  Left Carpal tunnel release  . CARPAL TUNNEL RELEASE Bilateral   . CHOLECYSTECTOMY    . COLONOSCOPY  05-29-11   per Dr. Henrene Pastor, benign polyps, repeat in 5 yrs    . CORONARY STENT PLACEMENT     Dr. Burt Knack  . DILATION AND CURETTAGE OF UTERUS    . ESOPHAGOGASTRODUODENOSCOPY  02-08-06   per Dr. Henrene Pastor, gastritis   . EYE SURGERY Left June 2016   Cataract  . GLAUCOMA SURGERY     with laser, per Dr. Henrene Pastor   . HARDWARE REMOVAL  10/30/2016   Procedure: HARDWARE REMOVAL;  Surgeon: Jessy Oto, MD;  Location: [place];  Service: Orthopedics;;  . LUMBAR FUSION  2008  . POLYPECTOMY    . POSTERIOR LUMBAR FUSION  10/30/2016   Removal of hardware rods and pedicle screws lumbar four, Extension of fusion L4-5 to L5-S1 with reinsertion of screws at L 4, left L5-S1 transforaminal lumbar interbody fusion, Exploration  left L5 nerve root, bilateral decompressive laminectomy L3-4/notes 10/30/2016  . ULNAR NERVE TRANSPOSITION Left 05/14/2014   Procedure: Left Ulnar nerve decompression;  Surgeon: Ashok Pall, MD;  Location: [place] [facility];  Service: Neurosurgery;  Laterality: Left;  Left Ulnar nerve  decompression    OB History    Gravida  2   Para  1   Term  1   Preterm      AB  1   Living  1     SAB  1   TAB      Ectopic      Multiple      Live Births               Home Medications    Prior to Admission medications   Medication Sig Start Date End Date Taking? Authorizing Provider  albuterol (PROVENTIL HFA;VENTOLIN HFA) 108 (90 Base) MCG/ACT inhaler Inhale 2 puffs into the lungs every 4 (four) hours as needed for wheezing  or shortness of breath. 11/05/16   Laurey Morale, MD  aspirin 81 MG tablet Take 81 mg by mouth daily.     [provider]  diclofenac sodium (VOLTAREN) 1 % GEL Apply 2 g topically 4 (four) times daily as needed (for pain).     [provider]  ergocalciferol (VITAMIN D2) 50000 UNITS capsule Take 50,000 Units by mouth every Saturday. On Saturday.    [provider]  furosemide (LASIX) 40 MG tablet TAKE ONE TABLET BY MOUTH ONCE DAILY 01/17/15   Sherren Mocha, MD  hydrALAZINE (APRESOLINE) 100 MG tablet TAKE 1 TABLET BY MOUTH THREE TIMES DAILY 11/06/17   Richardson Dopp T, PA-C  HYDROcodone-acetaminophen (NORCO/VICODIN) 5-325 MG tablet Take by mouth. 11/09/16   [provider]  HYDROcodone-acetaminophen (NORCO/VICODIN) 5-325 MG tablet Take 1-2 tablets by mouth every 6 (six) hours as needed for moderate pain. 07/24/17   Cornett, Marcello Moores, MD  insulin aspart (NOVOLOG) 100 UNIT/ML injection Inject into the skin.    [provider]  Insulin Human (INSULIN PUMP) SOLN Inject into the skin. Uses Novolog insulin for pump    [provider]  irbesartan (AVAPRO) 300 MG tablet TAKE 1 TABLET BY MOUTH ONCE DAILY 08/19/17   Sherren Mocha, MD  isosorbide mononitrate (IMDUR) 30 MG 24 hr tablet Take 1 tablet (30 mg total) by mouth daily. 07/17/17 10/15/17  Richardson Dopp T, PA-C  isosorbide mononitrate (IMDUR) 60 MG 24 hr tablet TAKE 1 TABLET BY MOUTH ONCE DAILY 11/08/17   Richardson Dopp T, PA-C  latanoprost (XALATAN) 0.005 %  ophthalmic solution Place 1 drop into both eyes at bedtime.  09/26/11   [provider]  metoprolol succinate (TOPROL-XL) 100 MG 24 hr tablet TAKE 1 TABLET BY MOUTH ONCE DAILY OR  IMMEDIATELY  FOLLOWING  A  MEAL 09/19/17   Weaver, Scott T, PA-C  montelukast (SINGULAIR) 10 MG tablet Take 10 mg by mouth daily.    [provider]  naproxen (NAPROSYN) 500 MG tablet Take 1 tablet (500 mg total) by mouth 2 (two) times daily with a meal. 07/18/17   Jessy Oto, MD  nitroGLYCERIN (NITROSTAT) 0.4 MG SL tablet Place 1 tablet (0.4 mg total) under the tongue every 5 (five) minutes as needed for chest pain. 11/24/14   Erlene Quan, PA-C  pantoprazole (PROTONIX) 40 MG tablet Take 40 mg by mouth daily.  05/19/15   [provider]  rosuvastatin (CRESTOR) 40 MG tablet Take 1 tablet (40 mg total) by mouth daily. 01/07/17 07/19/17  Sherren Mocha, MD  spironolactone (ALDACTONE) 25 MG tablet Take 1 tablet (25 mg total) by mouth daily. 11/12/17   Sherren Mocha, MD  traMADol (ULTRAM) 50 MG tablet Take 1 tablet (50 mg total) by mouth every 6 (six) hours as needed. 04/17/17   Jessy Oto, MD    Family History Family History  Problem Relation Age of Onset  . Hypertension Mother   . Hypertension Sister   . Hyperlipidemia Sister   . Hypertension Brother   . Cancer Brother        PROSTATE/LUNG  . Diabetes Brother   . Heart disease Brother        Before age 66 - Bypass  . Varicose Veins Brother   . Cancer Father        Prostate and pancreatic   . Stomach cancer Maternal Grandmother 27  . Heart disease Maternal Grandfather   . Breast cancer Maternal Aunt 64  . Cancer Maternal Aunt  Stomach  . Breast cancer Cousin 24       maternal first cousin  . Prostate cancer Cousin 22  . Breast cancer Cousin 68  . Esophageal cancer Cousin   . Breast cancer Cousin 40       maternal first cousin's daughter  . Coronary artery disease Neg Hx        premature CAD  . Colon cancer Neg Hx     . Colon polyps Neg Hx   . Rectal cancer Neg Hx     Social History Social History   Tobacco Use  . Smoking status: Former Smoker    Last attempt to quit: 01/22/1967    Years since quitting: 50.8  . Smokeless tobacco: Never Used  Substance Use Topics  . Alcohol use: No    Alcohol/week: 0.0 oz  . Drug use: No     Allergies   No known allergies   Review of Systems Review of Systems  Constitutional: Negative for chills and fever.  HENT: Negative for ear pain and sore throat.   Eyes: Negative for pain and visual disturbance.  Respiratory: Negative for cough and shortness of breath.   Cardiovascular: Negative for chest pain and palpitations.  Gastrointestinal: Negative for abdominal pain and vomiting.  Genitourinary: Positive for genital sores. Negative for dysuria and hematuria.  Musculoskeletal: Negative for arthralgias and back pain.  Skin: Negative for color change and rash.  Neurological: Negative for seizures and syncope.  All other systems reviewed and are negative.    Physical Exam Triage Vital Signs ED Triage Vitals [11/29/17 1334]  Enc Vitals Group     BP (!) 153/46     Pulse Rate 78     Resp 18     Temp 97.9 F (36.6 C)     Temp src      SpO2 100 %     Weight      Height      Head Circumference      Peak Flow      Pain Score      Pain Loc      Pain Edu?      Excl. in [place]?    No data found.  Updated Vital Signs BP (!) 153/46   Pulse 78   Temp 97.9 F (36.6 C)   Resp 18   SpO2 100%   Visual Acuity Right Eye Distance:   Left Eye Distance:   Bilateral Distance:    Right Eye Near:   Left Eye Near:    Bilateral Near:     Physical Exam  Constitutional: She appears well-developed.  Genitourinary:  Genitourinary Comments: There is a firm tender nodule right side of inner labia.  This is the specific area she points to as she describes a early abscess.     UC Treatments / Results  Labs (all labs ordered are listed, but only abnormal  results are displayed) Labs Reviewed - No data to display  EKG None  Radiology No results found.  Procedures Procedures (including critical care time)  Medications Ordered in UC Medications - No data to display  Initial Impression / Assessment and Plan / UC Course  I have reviewed the triage vital signs and the nursing notes.  Pertinent labs & imaging results that were available during my care of the patient were reviewed by me and considered in my medical decision making (see chart for details).     Labial abscess.  Will treat with sitz bath and doxycycline, which  she has had before. Also Diflucan 150 mg tablet x1 for the itching. Final Clinical Impressions(s) / UC Diagnoses   Final diagnoses:  None   Discharge Instructions   None    ED Prescriptions    None     Controlled Substance Prescriptions [place] Controlled Substance Registry consulted? No   Wardell Honour, MD 11/29/17 1407

## 2017-11-29 NOTE — ED Triage Notes (Signed)
Pt c/o vaginal abscess/boil with itching x2 days.

## 2017-12-03 ENCOUNTER — Ambulatory Visit (INDEPENDENT_AMBULATORY_CARE_PROVIDER_SITE_OTHER): Payer: Medicare Other | Admitting: Gynecology

## 2017-12-03 ENCOUNTER — Encounter: Payer: Self-pay | Admitting: Gynecology

## 2017-12-03 VITALS — BP 124/82

## 2017-12-03 DIAGNOSIS — N898 Other specified noninflammatory disorders of vagina: Secondary | ICD-10-CM

## 2017-12-03 DIAGNOSIS — N764 Abscess of vulva: Secondary | ICD-10-CM

## 2017-12-03 LAB — WET PREP FOR TRICH, YEAST, CLUE

## 2017-12-03 MED ORDER — METRONIDAZOLE 500 MG PO TABS: 500.0000 mg | ORAL_TABLET | Freq: Two times a day (BID) | ORAL | 0 refills | Status: DC

## 2017-12-03 NOTE — Progress Notes (Signed)
    [name] 1946/02/26 [id number]        72 y.o.  G2P1011 presents complaining of her right vulvar boil and vulvar irritation.  Was seen at the urgent care this past weekend and started on doxycycline and given 1 Diflucan.  Notes that the vulvar irritation has continued.  The abscess is starting to feel better.  No fevers or chills.  No significant discharge, urinary symptoms such as frequency dysuria urgency low back pain.  Past medical history,surgical history, problem list, medications, allergies, family history and social history were all reviewed and documented in the EPIC chart.  Directed ROS with pertinent positives and negatives documented in the history of present illness/assessment and plan.  Exam: [name] assistant Vitals:   12/03/17 1234  BP: 124/82   General appearance:  Normal Abdomen soft nontender without masses guarding rebound Pelvic external BUS vagina with atrophic changes.  Frothy white discharge noted.  Small pea-sized boil right lower labia majora.  No evidence of cellulitis.  No inguinal adenopathy.  Bimanual without masses or tenderness  Assessment/Plan:  72 y.o. G2P1011 with small boil right labia majora.  Recommended warm soaks and to finish her doxycycline prescription as prescribed.  Vaginal irritation/discharge  Noted with wet prep negative.  Appears as a bacterial vaginosis.  Was given Diflucan several days ago but did not seem to better the irritation.  Will cover as a low-grade bacterial vaginosis with Flagyl 500 mg twice daily x7 days.  Patient will follow-up if her symptoms persist, worsen or recur.    [name], 1:05 PM 12/03/2017

## 2017-12-03 NOTE — Patient Instructions (Signed)
Take the Flagyl medication twice daily for 7 days.  Avoid alcohol while taking.  Finish the doxycycline that was prescribed by the other doctor for the boil.

## 2017-12-05 ENCOUNTER — Ambulatory Visit: Payer: Medicare Other

## 2017-12-05 ENCOUNTER — Telehealth: Payer: Self-pay | Admitting: Physical Therapy

## 2017-12-05 NOTE — Telephone Encounter (Signed)
Message left about missed appointment and that no more visits scheduled.Asked her to call us about continuing or discharge.

## 2017-12-27 ENCOUNTER — Ambulatory Visit (INDEPENDENT_AMBULATORY_CARE_PROVIDER_SITE_OTHER): Payer: Medicare Other

## 2017-12-27 ENCOUNTER — Ambulatory Visit (INDEPENDENT_AMBULATORY_CARE_PROVIDER_SITE_OTHER): Payer: Medicare Other | Admitting: Specialist

## 2017-12-27 ENCOUNTER — Encounter (INDEPENDENT_AMBULATORY_CARE_PROVIDER_SITE_OTHER): Payer: Self-pay | Admitting: Specialist

## 2017-12-27 VITALS — BP 132/54 | HR 61 | Ht 64.0 in | Wt 191.5 lb

## 2017-12-27 DIAGNOSIS — M25551 Pain in right hip: Secondary | ICD-10-CM

## 2017-12-27 DIAGNOSIS — M4326 Fusion of spine, lumbar region: Secondary | ICD-10-CM | POA: Diagnosis not present

## 2017-12-27 DIAGNOSIS — M5136 Other intervertebral disc degeneration, lumbar region: Secondary | ICD-10-CM | POA: Diagnosis not present

## 2017-12-27 DIAGNOSIS — I2510 Atherosclerotic heart disease of native coronary artery without angina pectoris: Secondary | ICD-10-CM

## 2017-12-27 NOTE — Patient Instructions (Signed)
Avoid bending, stooping and avoid lifting weights greater than 10 lbs. Avoid prolong standing and walking. Avoid frequent bending and stooping  No lifting greater than 10 lbs. May use ice or moist heat for pain. Weight loss is of benefit. Handicap license is approved. Dr. Newton's secretary/Assistant will call to arrange for epidural steroid injection  

## 2017-12-27 NOTE — Progress Notes (Signed)
Office Visit Note   Patient: [name]           Date of Birth: 1945/07/14           MRN: [id number] Visit Date: 12/27/2017              Requested by: Laurey Morale, MD [place], [place] [id number] PCP: Laurey Morale, MD   Assessment & Plan: Visit Diagnoses:  1. Pain in right hip   2. Fusion of lumbar spine   3. Degenerative disc disease, lumbar     Plan:Avoid bending, stooping and avoid lifting weights greater than 10 lbs. Avoid prolong standing and walking. Avoid frequent bending and stooping  No lifting greater than 10 lbs. May use ice or moist heat for pain. Weight loss is of benefit. Handicap license is approved. Dr. Romona Curls secretary/Assistant will call to arrange for epidural steroid injection  Follow-Up Instructions: Return in about 3 years (around 12/27/2020).   Orders:  Orders Placed This Encounter  Procedures  . XR HIP UNILAT W OR W/O PELVIS 2-3 VIEWS RIGHT  . XR Lumbar Spine 2-3 Views   No orders of the defined types were placed in this encounter.     Procedures: No procedures performed   Clinical Data: No additional findings.   Subjective: Chief Complaint  Patient presents with  . Lower Back - Pain, Follow-up  . Right Hip - Pain, Follow-up    72 year old female with history of L4 to S1 fusion in 10/2016. She is experiencing some pain in the right hip with repetitive bending and when getting out of the car She feels stiffness in the right hip. She is able to place her shoes and socks without difficulty. No bowel or bladder difficulty. Does have pain from her shoulder blades and downwards that occurs and is straight across one or two times per week.    Review of Systems  Constitutional: Negative.   HENT: Negative.   Eyes: Negative.   Respiratory: Negative.   Cardiovascular: Negative.   Gastrointestinal: Negative.   Endocrine: Negative.   Genitourinary: Negative.   Musculoskeletal: Negative.   Skin: Negative.     Allergic/Immunologic: Negative.   Neurological: Negative.   Hematological: Negative.   Psychiatric/Behavioral: Negative.      Objective: Vital Signs: BP (!) 132/54 (BP Location: Right Arm, Patient Position: Sitting, Cuff Size: Large)   Pulse 61   Ht 5\' 4"  (1.626 m)   Wt 191 lb 8 oz (86.9 kg)   BMI 32.87 kg/m   Physical Exam  Constitutional: She is oriented to person, place, and time. She appears well-developed and well-nourished.  HENT:  Head: Normocephalic and atraumatic.  Eyes: Pupils are equal, round, and reactive to light. EOM are normal.  Neck: Normal range of motion. Neck supple.  Pulmonary/Chest: Effort normal and breath sounds normal.  Abdominal: Soft. Bowel sounds are normal.  Musculoskeletal: Normal range of motion.  Neurological: She is alert and oriented to person, place, and time.  Skin: Skin is warm and dry.  Psychiatric: She has a normal mood and affect. Her behavior is normal. Judgment and thought content normal.    Ortho Exam  Specialty Comments:  No specialty comments available.  Imaging: Xr Hip Unilat W Or W/o Pelvis 2-3 Views Right  Result Date: 12/27/2017 AP and lateral of the right hip with mild superolateral acetabular osteophyte, joint line is well maintained and no acute change noted. No fracture dislocation or subluxation noted. Minimal DJD right  hip    PMFS History: Patient Active Problem List   Diagnosis Date Noted  . Anemia due to blood loss 11/02/2016    Priority: High    Class: Acute  . Spondylolisthesis, lumbar region 10/30/2016    Priority: High    Class: Chronic  . Spinal stenosis of lumbar region 10/30/2016    Priority: High    Class: Chronic  . Retained orthopedic hardware 10/30/2016    Priority: High    Class: Chronic  . Hyperlipidemia 07/17/2017  . History of lumbar spinal fusion   . History of fusion of lumbar spine   . Lethargy 10/31/2016  . AKI (acute kidney injury) ([place]) 10/31/2016  . Chronic diastolic CHF  (congestive heart failure) ([place]) 10/31/2016  . Cough   . Leukocytosis   . Spinal stenosis of lumbar region with neurogenic claudication 10/30/2016  . Genetic testing 09/14/2015  . Atypical ductal hyperplasia of left breast 08/24/2015  . Family history of breast cancer   . Family history of prostate cancer   . Family history of stomach cancer   . Abnormal nuclear stress test 12/29/2014  . Cervical spondylosis with myelopathy and radiculopathy 06/11/2014  . Carpal tunnel syndrome 05/14/2014  . Aftercare following surgery of the circulatory system, [place] 03/11/2014  . Pain of right lower extremity 03/11/2014  . Atypical lobular hyperplasia of left breast 02/12/2014  . Fibromyalgia 09/21/2013  . Carotid artery disease ([place]) 02/29/2012  . Back abscess 01/22/2012  . Chest pain with moderate risk of acute coronary syndrome 12/09/2009  . Shortness of breath 06/10/2009  . Orthostatic hypotension 01/31/2009  . CAD (coronary artery disease) 12/23/2008  . Normocytic anemia 07/21/2008  . Headache(784.0) 07/21/2008  . History of TIA (transient ischemic attack) 07/21/2008  . Edema 02/27/2008  . Diabetes mellitus with complication ([place]) 68/05/5725  . Depression 07/16/2007  . Dyslipidemia 02/21/2007  . Essential hypertension 02/21/2007  . ASTHMA 02/21/2007  . GERD 02/21/2007  . LOW BACK PAIN 02/21/2007   Past Medical History:  Diagnosis Date  . Abnormal nuclear stress test 12/29/2014  . Allergy   . Anemia, unspecified   . Arthritis   . Blood transfusion without reported diagnosis    2018  . CAD (coronary artery disease)    sees. Dr. Burt Knack. s/p cath with PCI of RCA (xience des) June 2010. Pt also with LAD and D2 dzs-NL FFR in both areas med Rx  . Carotid artery occlusion   . Cataract    sees Dr. Herbert Deaner   . Depressive disorder, not elsewhere classified   . Duodenal nodule   . Edema   . Fibromyalgia   . Gastric polyps    COLON  . GERD (gastroesophageal reflux disease)   . Glaucoma     narrow angle, sees Dr. Herbert Deaner   . Headache(784.0)   . Hearing loss    left ear  . Heart murmur    questionable  . Hemorrhoids   . HSV (herpes simplex virus) anogenital infection 05/2014  . HTN (hypertension)   . Hyperlipidemia   . Low back pain   . Obesity   . Peripheral vascular disease ([place])   . Stroke [facility])    TIA  "many yrs ago"    . TIA (transient ischemic attack)    also Hx of it.   . Type II or unspecified type diabetes mellitus without mention of complication, not stated as uncontrolled    sees Dr. Carrolyn Meiers     Family History  Problem Relation Age of Onset  .  Hypertension Mother   . Hypertension Sister   . Hyperlipidemia Sister   . Hypertension Brother   . Cancer Brother        PROSTATE/LUNG  . Diabetes Brother   . Heart disease Brother        Before age 2 - Bypass  . Varicose Veins Brother   . Cancer Father        Prostate and pancreatic   . Stomach cancer Maternal Grandmother 36  . Heart disease Maternal Grandfather   . Breast cancer Maternal Aunt 55  . Cancer Maternal Aunt        Stomach  . Breast cancer Cousin 26       maternal first cousin  . Prostate cancer Cousin 66  . Breast cancer Cousin 34  . Esophageal cancer Cousin   . Breast cancer Cousin 40       maternal first cousin's daughter  . Coronary artery disease Neg Hx        premature CAD  . Colon cancer Neg Hx   . Colon polyps Neg Hx   . Rectal cancer Neg Hx     Past Surgical History:  Procedure Laterality Date  . ABDOMINAL HYSTERECTOMY  age 67   TAH and USO  Leiomyomata  . ANTERIOR CERVICAL CORPECTOMY N/A 06/11/2014   Procedure: ANTERIOR CERVICAL CORPECTOMY CERVICAL SIX; WITH CERVICAL FIVE TO CERVICAL SEVEN ARTHRODESIS;  Surgeon: Ashok Pall, MD;  Location: [place] [facility];  Service: Neurosurgery;  Laterality: N/A;  . BACK SURGERY  2008   lumb fusion  . BREAST BIOPSY Left 01/26/2014   Procedure: EXCISION LEFT BREAST MASS;  Surgeon: Adin Hector, MD;  Location: [facility];  Service: General;  Laterality: Left;  . BREAST LUMPECTOMY WITH RADIOACTIVE SEED LOCALIZATION Left 07/24/2017   Procedure: LEFT BREAST LUMPECTOMY WITH RADIOACTIVE SEED LOCALIZATION ERAS;  Surgeon: Erroll Luna, MD;  Location: [facility];  Service: General;  Laterality: Left;  . CARDIAC CATHETERIZATION    . CARDIAC CATHETERIZATION N/A 12/29/2014   Procedure: Left Heart Cath and Coronary Angiography;  Surgeon: Sherren Mocha, MD;  Location: [facility];  Service: Cardiovascular;  Laterality: N/A;  . CAROTID ENDARTERECTOMY  march 2012   per Dr. Morton Amy  . CARPAL TUNNEL RELEASE Left 05/14/2014   Procedure: Left Carpal tunnel release;  Surgeon: Ashok Pall, MD;  Location: [place] [facility];  Service: Neurosurgery;  Laterality: Left;  Left Carpal tunnel release  . CARPAL TUNNEL RELEASE Bilateral   . CHOLECYSTECTOMY    . COLONOSCOPY  05-29-11   per Dr. Henrene Pastor, benign polyps, repeat in 5 yrs    . CORONARY STENT PLACEMENT     Dr. Burt Knack  . DILATION AND CURETTAGE OF UTERUS    . ESOPHAGOGASTRODUODENOSCOPY  02-08-06   per Dr. Henrene Pastor, gastritis   . EYE SURGERY Left June 2016   Cataract  . GLAUCOMA SURGERY     with laser, per Dr. Henrene Pastor   . HARDWARE REMOVAL  10/30/2016   Procedure: HARDWARE REMOVAL;  Surgeon: Jessy Oto, MD;  Location: [facility];  Service: Orthopedics;;  . LUMBAR FUSION  2008  . POLYPECTOMY    . POSTERIOR LUMBAR FUSION  10/30/2016   Removal of hardware rods and pedicle screws lumbar four, Extension of fusion L4-5 to L5-S1 with reinsertion of screws at L 4, left L5-S1 transforaminal lumbar interbody fusion, Exploration  left L5 nerve root, bilateral decompressive laminectomy L3-4/notes 10/30/2016  . ULNAR NERVE TRANSPOSITION Left 05/14/2014   Procedure:  Left Ulnar nerve decompression;  Surgeon: Ashok Pall, MD;  Location: [facility];  Service: Neurosurgery;  Laterality: Left;  Left Ulnar nerve decompression   Social History   Occupational History  .  Not on file  Tobacco Use  . Smoking status: Former Smoker    Last attempt to quit: 01/22/1967    Years since quitting: 50.9  . Smokeless tobacco: Never Used  Substance and Sexual Activity  . Alcohol use: No    Alcohol/week: 0.0 oz  . Drug use: No  . Sexual activity: Never    Birth control/protection: Surgical, Post-menopausal    Comment: HYST-1st intercourse 63 yo-5 partners

## 2018-01-03 ENCOUNTER — Other Ambulatory Visit (INDEPENDENT_AMBULATORY_CARE_PROVIDER_SITE_OTHER): Payer: Self-pay | Admitting: Radiology

## 2018-01-03 ENCOUNTER — Telehealth (INDEPENDENT_AMBULATORY_CARE_PROVIDER_SITE_OTHER): Payer: Self-pay | Admitting: Specialist

## 2018-01-03 DIAGNOSIS — M4326 Fusion of spine, lumbar region: Secondary | ICD-10-CM

## 2018-01-03 DIAGNOSIS — M5136 Other intervertebral disc degeneration, lumbar region: Secondary | ICD-10-CM

## 2018-01-03 NOTE — Telephone Encounter (Signed)
Patient is aware that order has been placed for [facility]

## 2018-01-03 NOTE — Telephone Encounter (Signed)
Please call to discuss pt care   Pt called in a lot of pain requesting her epidural

## 2018-01-06 DIAGNOSIS — M8580 Other specified disorders of bone density and structure, unspecified site: Secondary | ICD-10-CM

## 2018-01-06 HISTORY — DX: Other specified disorders of bone density and structure, unspecified site: M85.80

## 2018-01-15 ENCOUNTER — Ambulatory Visit
Admission: RE | Admit: 2018-01-15 | Discharge: 2018-01-15 | Disposition: A | Discharge status: Home / Self Care | Payer: Medicare Other | Source: Ambulatory Visit | Attending: Specialist | Admitting: Specialist

## 2018-01-15 DIAGNOSIS — M545 Low back pain: Secondary | ICD-10-CM | POA: Diagnosis not present

## 2018-01-15 DIAGNOSIS — M4326 Fusion of spine, lumbar region: Secondary | ICD-10-CM

## 2018-01-15 DIAGNOSIS — M5136 Other intervertebral disc degeneration, lumbar region: Secondary | ICD-10-CM

## 2018-01-15 MED ORDER — METHYLPREDNISOLONE ACETATE 40 MG/ML INJ SUSP (RADIOLOG
120.0000 mg | Freq: Once | INTRAMUSCULAR | Status: AC
  Administered 2018-01-15: 120 mg via EPIDURAL

## 2018-01-15 MED ORDER — IOPAMIDOL (ISOVUE-M 200) INJECTION 41%
1.0000 mL | Freq: Once | INTRAMUSCULAR | Status: AC
  Administered 2018-01-15: 1 mL via EPIDURAL

## 2018-01-15 NOTE — Discharge Instructions (Signed)

## 2018-01-16 ENCOUNTER — Encounter: Payer: Self-pay | Admitting: Gynecology

## 2018-01-16 ENCOUNTER — Ambulatory Visit (INDEPENDENT_AMBULATORY_CARE_PROVIDER_SITE_OTHER): Payer: Medicare Other | Admitting: Gynecology

## 2018-01-16 VITALS — BP 124/74 | Ht 63.0 in | Wt 193.0 lb

## 2018-01-16 DIAGNOSIS — I2510 Atherosclerotic heart disease of native coronary artery without angina pectoris: Secondary | ICD-10-CM

## 2018-01-16 DIAGNOSIS — Z9189 Other specified personal risk factors, not elsewhere classified: Secondary | ICD-10-CM | POA: Diagnosis not present

## 2018-01-16 DIAGNOSIS — Z01419 Encounter for gynecological examination (general) (routine) without abnormal findings: Secondary | ICD-10-CM | POA: Diagnosis not present

## 2018-01-16 DIAGNOSIS — N898 Other specified noninflammatory disorders of vagina: Secondary | ICD-10-CM | POA: Diagnosis not present

## 2018-01-16 DIAGNOSIS — N952 Postmenopausal atrophic vaginitis: Secondary | ICD-10-CM | POA: Diagnosis not present

## 2018-01-16 LAB — WET PREP FOR TRICH, YEAST, CLUE

## 2018-01-16 MED ORDER — NYSTATIN-TRIAMCINOLONE 100000-0.1 UNIT/GM-% EX CREA: 1.0000 "application " | TOPICAL_CREAM | Freq: Two times a day (BID) | CUTANEOUS | 2 refills | Status: DC

## 2018-01-16 NOTE — Progress Notes (Signed)
    [name] [birth year] [id number]        72 y.o.  G2P1011 for breast and pelvic exam.  Patient notes vulvar itching that comes and goes.  Also has an intermittent white vaginal discharge.  Had some vaginal itching in May and was treated with Flagyl as a low-grade bacterial infection with resolution of her symptoms but now comes back intermittently.  No urinary symptoms such as frequency dysuria urgency low back pain fever or chills.  No nausea vomiting diarrhea constipation.  Has a history of recurrent vulvar boils but has no issues at this time.  Past medical history,surgical history, problem list, medications, allergies, family history and social history were all reviewed and documented as reviewed in the EPIC chart.  ROS:  Performed with pertinent positives and negatives included in the history, assessment and plan.   Additional significant findings : None   Exam: [name] assistant Vitals:   01/16/18 1028  BP: 124/74  Weight: 193 lb (87.5 kg)  Height: 5\' 3"  (1.6 m)   Body mass index is 34.19 kg/m.  General appearance:  Normal affect, orientation and appearance. Skin: Grossly normal HEENT: Without gross lesions.  No cervical or supraclavicular adenopathy. Thyroid normal.  Lungs:  Clear without wheezing, rales or rhonchi Cardiac: RR, without RMG Abdominal:  Soft, nontender, without masses, guarding, rebound, organomegaly or hernia Breasts:  Examined lying and sitting without masses, retractions, discharge or axillary adenopathy. Pelvic:  Ext, BUS, Vagina: With atrophic changes.  Scant white discharge noted.  No vulvar lesions/irritation noted.  Adnexa: Without masses or tenderness    Anus and perineum: Normal   Rectovaginal: Normal sphincter tone without palpated masses or tenderness.    Assessment/Plan:  72 y.o. G54P1011 female for breast and pelvic exam status post TAH in the past.   1. Vulvar itching.  Exam unremarkable.  Wet prep is negative.  We discussed  possible etiologies for vulvar pruritus to include infectious versus primary skin disease.  No abnormalities on exam to suggest lichen sclerosis or other chronic dermatoses.  I suspect she may have some low-grade vulvar yeast that comes and goes.  Will treat with Mycolog cream as needed.  Assuming she has a good response and she will use this intermittently as needed.  If she continues to have symptoms despite this she will represent for further evaluation. 2. Postmenopausal/atrophic genital changes.  No significant menopausal symptoms. 3. DEXA 2013 normal.  Recommend follow-up DEXA now as she has turned 43.  Patient will schedule in follow-up for this. 4. Pap smear 2011.  No Pap smear done today.  No history of abnormal Pap smears.  We both agree to stop screening based on age and hysterectomy history per current screening guidelines. 5. Mammography 03/2017.  Continue with annual mammography this fall when due.  Breast exam normal today. 6. Colonoscopy 2018.  Repeat at their recommended interval. 7. History of genital HSV.  Occasional outbreaks.  Has Valtrex at home but will call when needs more. 8. Health maintenance.  No routine lab work done as this is done elsewhere.  Follow-up 1 for bone density otherwise follow-up in 1 year for annual exam.  Additional time in excess of her breast and pelvic exam was spent in direct face to face counseling and coordination of care in regards to her recurrent vulvar itching with treatment provided.    [name], 10:43 AM 01/16/2018

## 2018-01-16 NOTE — Patient Instructions (Signed)
Apply the prescribed cream externally twice daily as needed for itching.  Follow-up if the symptoms persist despite the use of the cream.  Follow-up for the bone density as scheduled.  Follow-up in 1 year for annual exam.

## 2018-01-21 DIAGNOSIS — E7849 Other hyperlipidemia: Secondary | ICD-10-CM | POA: Diagnosis not present

## 2018-01-21 DIAGNOSIS — D6489 Other specified anemias: Secondary | ICD-10-CM | POA: Diagnosis not present

## 2018-01-21 DIAGNOSIS — I10 Essential (primary) hypertension: Secondary | ICD-10-CM | POA: Diagnosis not present

## 2018-01-21 DIAGNOSIS — I2510 Atherosclerotic heart disease of native coronary artery without angina pectoris: Secondary | ICD-10-CM | POA: Diagnosis not present

## 2018-01-21 DIAGNOSIS — Z8673 Personal history of transient ischemic attack (TIA), and cerebral infarction without residual deficits: Secondary | ICD-10-CM | POA: Diagnosis not present

## 2018-01-21 DIAGNOSIS — Z1389 Encounter for screening for other disorder: Secondary | ICD-10-CM | POA: Diagnosis not present

## 2018-01-21 DIAGNOSIS — D126 Benign neoplasm of colon, unspecified: Secondary | ICD-10-CM | POA: Diagnosis not present

## 2018-01-21 DIAGNOSIS — I6523 Occlusion and stenosis of bilateral carotid arteries: Secondary | ICD-10-CM | POA: Diagnosis not present

## 2018-01-21 DIAGNOSIS — Z6830 Body mass index (BMI) 30.0-30.9, adult: Secondary | ICD-10-CM | POA: Diagnosis not present

## 2018-01-21 DIAGNOSIS — M48061 Spinal stenosis, lumbar region without neurogenic claudication: Secondary | ICD-10-CM | POA: Diagnosis not present

## 2018-01-21 DIAGNOSIS — E559 Vitamin D deficiency, unspecified: Secondary | ICD-10-CM | POA: Diagnosis not present

## 2018-01-21 DIAGNOSIS — E1159 Type 2 diabetes mellitus with other circulatory complications: Secondary | ICD-10-CM | POA: Diagnosis not present

## 2018-01-21 DIAGNOSIS — N62 Hypertrophy of breast: Secondary | ICD-10-CM | POA: Diagnosis not present

## 2018-01-21 DIAGNOSIS — F3289 Other specified depressive episodes: Secondary | ICD-10-CM | POA: Diagnosis not present

## 2018-01-28 ENCOUNTER — Ambulatory Visit (INDEPENDENT_AMBULATORY_CARE_PROVIDER_SITE_OTHER): Payer: Medicare Other | Admitting: Physician Assistant

## 2018-01-28 ENCOUNTER — Encounter: Payer: Self-pay | Admitting: Physician Assistant

## 2018-01-28 VITALS — BP 110/50 | HR 68 | Ht 63.0 in | Wt 183.0 lb

## 2018-01-28 DIAGNOSIS — I10 Essential (primary) hypertension: Secondary | ICD-10-CM

## 2018-01-28 DIAGNOSIS — E785 Hyperlipidemia, unspecified: Secondary | ICD-10-CM | POA: Diagnosis not present

## 2018-01-28 DIAGNOSIS — I5032 Chronic diastolic (congestive) heart failure: Secondary | ICD-10-CM | POA: Diagnosis not present

## 2018-01-28 DIAGNOSIS — I2510 Atherosclerotic heart disease of native coronary artery without angina pectoris: Secondary | ICD-10-CM

## 2018-01-28 DIAGNOSIS — I739 Peripheral vascular disease, unspecified: Secondary | ICD-10-CM

## 2018-01-28 DIAGNOSIS — I779 Disorder of arteries and arterioles, unspecified: Secondary | ICD-10-CM

## 2018-01-28 NOTE — Patient Instructions (Signed)
Medication Instructions:  1. Your physician recommends that you continue on your current medications as directed. Please refer to the Current Medication list given to you today.   Labwork: NONE ORDERED TODAY  Testing/Procedures: NONE ORDERED TODAY  Follow-Up: 6 MONTHS WITH DR. Burt Knack   Any Other Special Instructions Will Be Listed Below (If Applicable).     If you need a refill on your cardiac medications before your next appointment, please call your pharmacy.

## 2018-01-28 NOTE — Progress Notes (Signed)
Cardiology Office Note:    Date:  01/28/2018   ID:  [name], DOB 1946-02-07, MRN [id number]  PCP:  Laurey Morale, MD  Cardiologist:  Sherren Mocha, MD    Referring MD: Laurey Morale, MD   Chief Complaint  Patient presents with  . Follow-up    CAD    History of Present Illness:    [name] is a 72 y.o. female with coronary disease, diabetes, hypertension, prior TIA, carotid artery disease status post right CEA.  She initially presented in 2010 with unstable angina and underwent stenting of the RCA as well as pressure wire analysis of LAD/diagonal branches.  Cardiac catheterization in June 2016 demonstrated moderate nonobstructive disease in the proximal LAD unchanged from 2010 and patent stent in the RCA.  She has had side effects to carvedilol in the past with swelling.  She was last seen in January 2019.    [name] returns for follow-up.  She is here alone.  She continues to have significant back pain and recently underwent epidural steroid injections.  She is quite limited by her back symptoms.  She denies chest pain, shortness of breath, syncope, PND or lower extremity swelling.  She did have some significant dizziness for a few days after her most recent back injection.  She is been doing a good job of watching her diet and has lost about 10 pounds.  Prior CV studies:   The following studies were reviewed today:  Carotid US 40/10 Patent R CEA; LICA 2-72  LHC 5/36/64 LM: Ostial 30 LAD: prox 50, dist 50; D1 60 LCx: lum irregs RCA: prox 30, mid stent ok Med Rx  Echo 12/30/14 EF 60% to 65%. Wall motion was normal; Grade 2 diastolic dysfunction   Myoview 12/20/14 Possible small defect in the inferolateral wall. Ischemia cannot be completely ruled out but there is significant diaphragmatic and soft tissue attenuation on RAW images noted. This is a low risk study. EF55-65%  Renal Art [place] 10/29/11 Normal renal arteries bilaterally  Past Medical  History:  Diagnosis Date  . Abnormal nuclear stress test 12/29/2014  . Allergy   . Anemia, unspecified   . Arthritis   . Blood transfusion without reported diagnosis    2018  . CAD (coronary artery disease)    sees. Dr. Burt Knack. s/p cath with PCI of RCA (xience des) June 2010. Pt also with LAD and D2 dzs-NL FFR in both areas med Rx  . Carotid artery occlusion   . Cataract    sees Dr. Herbert Deaner   . Depressive disorder, not elsewhere classified   . Duodenal nodule   . Edema   . Fibromyalgia   . Gastric polyps    COLON  . GERD (gastroesophageal reflux disease)   . Glaucoma    narrow angle, sees Dr. Herbert Deaner   . Headache(784.0)   . Hearing loss    left ear  . Heart murmur    questionable  . Hemorrhoids   . HSV (herpes simplex virus) anogenital infection 05/2014  . HTN (hypertension)   . Hyperlipidemia   . Low back pain   . Obesity   . Peripheral vascular disease ([place])   . Stroke [facility])    TIA  "many yrs ago"    . TIA (transient ischemic attack)    also Hx of it.   . Type II or unspecified type diabetes mellitus without mention of complication, not stated as uncontrolled    sees Dr. Carrolyn Meiers  Surgical Hx: The patient  has a past surgical history that includes Colonoscopy (05-29-11); Esophagogastroduodenoscopy (02-08-06); Glaucoma surgery; Carotid endarterectomy (march 2012); Abdominal hysterectomy (age 43); Cholecystectomy; Dilation and curettage of uterus; Breast biopsy (Left, 01/26/2014); Cardiac catheterization; Carpal tunnel release (Left, 05/14/2014); Ulnar nerve transposition (Left, 05/14/2014); Anterior cervical corpectomy (N/A, 06/11/2014); Cardiac catheterization (N/A, 12/29/2014); Eye surgery (Left, June 2016); Back surgery (2008); Carpal tunnel release (Bilateral); Lumbar fusion (2008); Posterior lumbar fusion (10/30/2016); Hardware Removal (10/30/2016); Polypectomy; Coronary stent placement; and Breast lumpectomy with radioactive seed localization (Left, 07/24/2017).   Current  Medications: Current Meds  Medication Sig  . albuterol (PROVENTIL HFA;VENTOLIN HFA) 108 (90 Base) MCG/ACT inhaler Inhale 2 puffs into the lungs every 4 (four) hours as needed for wheezing or shortness of breath.  Marland Kitchen aspirin 81 MG tablet Take 81 mg by mouth daily.   . diclofenac sodium (VOLTAREN) 1 % GEL Apply 2 g topically 4 (four) times daily as needed (for pain).   Marland Kitchen doxycycline (VIBRAMYCIN) 100 MG capsule Take 1 capsule (100 mg total) by mouth 2 (two) times daily.  . ergocalciferol (VITAMIN D2) 50000 UNITS capsule Take 50,000 Units by mouth every Saturday. On Saturday.  . fluocinonide (LIDEX) 0.05 % external solution   . furosemide (LASIX) 40 MG tablet TAKE ONE TABLET BY MOUTH ONCE DAILY  . HUMALOG 100 UNIT/ML injection   . hydrALAZINE (APRESOLINE) 100 MG tablet TAKE 1 TABLET BY MOUTH THREE TIMES DAILY  . HYDROcodone-acetaminophen (NORCO/VICODIN) 5-325 MG tablet Take by mouth.  Marland Kitchen HYDROcodone-acetaminophen (NORCO/VICODIN) 5-325 MG tablet Take 1-2 tablets by mouth every 6 (six) hours as needed for moderate pain.  Marland Kitchen insulin aspart (NOVOLOG) 100 UNIT/ML injection Inject into the skin.  . Insulin Disposable Pump (V-GO 40) KIT   . Insulin Human (INSULIN PUMP) SOLN Inject into the skin. Uses Novolog insulin for pump  . irbesartan (AVAPRO) 300 MG tablet TAKE 1 TABLET BY MOUTH ONCE DAILY  . isosorbide mononitrate (IMDUR) 60 MG 24 hr tablet TAKE 1 TABLET BY MOUTH ONCE DAILY  . latanoprost (XALATAN) 0.005 % ophthalmic solution Place 1 drop into both eyes at bedtime.   . metoprolol succinate (TOPROL-XL) 100 MG 24 hr tablet TAKE 1 TABLET BY MOUTH ONCE DAILY OR  IMMEDIATELY  FOLLOWING  A  MEAL  . montelukast (SINGULAIR) 10 MG tablet Take 10 mg by mouth daily.  . naproxen (NAPROSYN) 500 MG tablet Take 1 tablet (500 mg total) by mouth 2 (two) times daily with a meal.  . nitroGLYCERIN (NITROSTAT) 0.4 MG SL tablet Place 1 tablet (0.4 mg total) under the tongue every 5 (five) minutes as needed for chest pain.    Marland Kitchen nystatin-triamcinolone (MYCOLOG II) cream Apply 1 application topically 2 (two) times daily.  . pantoprazole (PROTONIX) 40 MG tablet Take 40 mg by mouth daily.   Marland Kitchen spironolactone (ALDACTONE) 25 MG tablet Take 1 tablet (25 mg total) by mouth daily.  . traMADol (ULTRAM) 50 MG tablet Take 1 tablet (50 mg total) by mouth every 6 (six) hours as needed.     Allergies:   No known allergies   Social History   Tobacco Use  . Smoking status: Former Smoker    Last attempt to quit: 01/22/1967    Years since quitting: 51.0  . Smokeless tobacco: Never Used  Substance Use Topics  . Alcohol use: No    Alcohol/week: 0.0 oz  . Drug use: No     Family Hx: [name] family history includes Breast cancer (age of onset: 34) in her  cousin; Breast cancer (age of onset: 68) in her maternal aunt; Breast cancer (age of onset: 63) in her cousin; Breast cancer (age of onset: 69) in her cousin; Cancer in her brother, father, and maternal aunt; Diabetes in her brother; Esophageal cancer in her cousin; Heart disease in her brother and maternal grandfather; Hyperlipidemia in her sister; Hypertension in her brother, mother, and sister; Prostate cancer (age of onset: 59) in her cousin; Stomach cancer (age of onset: 22) in her maternal grandmother; Varicose Veins in her brother. There is no history of Coronary artery disease, Colon cancer, Colon polyps, or Rectal cancer.  ROS:   Please see the history of present illness.    ROS All other systems reviewed and are negative.   EKGs/Labs/Other Test Reviewed:    EKG:  EKG is not ordered today.    Recent Labs: 03/26/2017: ALT 11; BUN 17; Creatinine, Ser 0.91; Hemoglobin 12.3; Platelets 184.0; Potassium 3.8; Sodium 144; TSH 1.74   From K[facility] Tool: Cholesterol, total  139.000 m  01/21/2018 HDL    45 MG/DL  01/21/2018 LDL    66.000 mg  01/21/2018 Triglycerides   138.000  01/21/2018 A1C    11.700 %  01/21/2018 Hemoglobin   14.600 g/  01/21/2018 Creatinine, Serum  1.400  mg/  01/21/2018 Potassium   3.800   03/26/2017 ALT (SGPT)   37.000 uni  01/21/2018 TSH    1.800   01/21/2018  Recent Lipid Panel Lab Results  Component Value Date/Time   CHOL 102 03/26/2017 11:22 AM   TRIG 127.0 03/26/2017 11:22 AM   HDL 39.40 03/26/2017 11:22 AM   CHOLHDL 3 03/26/2017 11:22 AM   LDLCALC 37 03/26/2017 11:22 AM    Physical Exam:    VS:  BP (!) 110/50   Pulse 68   Ht 5' 3"  (1.6 m)   Wt 183 lb (83 kg)   SpO2 99%   BMI 32.42 kg/m     Wt Readings from Last 3 Encounters:  01/28/18 183 lb (83 kg)  01/16/18 193 lb (87.5 kg)  12/27/17 191 lb 8 oz (86.9 kg)     Physical Exam  Constitutional: She is oriented to person, place, and time. She appears well-developed and well-nourished. No distress.  HENT:  Head: Normocephalic and atraumatic.  Neck: Neck supple.  Cardiovascular: Normal rate, regular rhythm, S1 normal and S2 normal.  Murmur heard.  Early systolic murmur is present with a grade of 2/6 at the upper left sternal border. Pulmonary/Chest: Effort normal. She has no rales.  Abdominal: Soft.  Musculoskeletal: She exhibits no edema.  Neurological: She is alert and oriented to person, place, and time.  Skin: Skin is warm and dry.  Psychiatric: She has a normal mood and affect.    ASSESSMENT & PLAN:    Coronary artery disease involving native coronary artery of native heart without angina pectoris History of prior stenting to the RCA.  Cardiac catheterization 2016 with patent stent and nonobstructive disease in LAD.  She denies anginal symptoms.  Continue aspirin, nitrates, beta-blocker, statin.  Bilateral carotid artery disease, unspecified type ([place]) This is followed by vascular surgery.  She plans to arrange follow-up soon.  Chronic diastolic CHF (congestive heart failure) (HCC) Volume overall stable.  Continue current therapy.  Essential hypertension [name] blood pressure is controlled on her current regimen.  Continue current therapy.    Hyperlipidemia, unspecified hyperlipidemia type LDL optimal on most recent lab work.  Continue current Rx.     Dispo:  Return in about  6 months (around 07/31/2018) for Routine Follow Up, w/ Dr. Burt Knack.   Medication Adjustments/Labs and Tests Ordered: Current medicines are reviewed at length with the patient today.  Concerns regarding medicines are outlined above.  Tests Ordered: No orders of the defined types were placed in this encounter.  Medication Changes: No orders of the defined types were placed in this encounter.   Signed, Richardson Dopp, PA-C  01/28/2018 11:25 AM    [facility] [place], [place], [place]  [address] Phone: [phone]; Fax: [phone]

## 2018-01-30 ENCOUNTER — Other Ambulatory Visit: Payer: Self-pay | Admitting: Gynecology

## 2018-01-30 ENCOUNTER — Ambulatory Visit (INDEPENDENT_AMBULATORY_CARE_PROVIDER_SITE_OTHER): Payer: Medicare Other

## 2018-01-30 DIAGNOSIS — M8589 Other specified disorders of bone density and structure, multiple sites: Secondary | ICD-10-CM

## 2018-01-30 DIAGNOSIS — Z780 Asymptomatic menopausal state: Secondary | ICD-10-CM | POA: Diagnosis not present

## 2018-01-30 DIAGNOSIS — Z01419 Encounter for gynecological examination (general) (routine) without abnormal findings: Secondary | ICD-10-CM

## 2018-01-31 ENCOUNTER — Encounter: Payer: Self-pay | Admitting: Gynecology

## 2018-02-06 ENCOUNTER — Encounter: Payer: Self-pay | Admitting: Anesthesiology

## 2018-02-07 ENCOUNTER — Ambulatory Visit (INDEPENDENT_AMBULATORY_CARE_PROVIDER_SITE_OTHER): Payer: Medicare Other | Admitting: Specialist

## 2018-02-12 ENCOUNTER — Other Ambulatory Visit: Payer: Self-pay

## 2018-02-12 DIAGNOSIS — I6523 Occlusion and stenosis of bilateral carotid arteries: Secondary | ICD-10-CM

## 2018-02-12 DIAGNOSIS — Z9889 Other specified postprocedural states: Secondary | ICD-10-CM

## 2018-02-20 ENCOUNTER — Encounter (INDEPENDENT_AMBULATORY_CARE_PROVIDER_SITE_OTHER): Payer: Self-pay | Admitting: Surgery

## 2018-02-20 ENCOUNTER — Ambulatory Visit (INDEPENDENT_AMBULATORY_CARE_PROVIDER_SITE_OTHER): Payer: Medicare Other | Admitting: Surgery

## 2018-02-20 DIAGNOSIS — G8929 Other chronic pain: Secondary | ICD-10-CM

## 2018-02-20 DIAGNOSIS — M5136 Other intervertebral disc degeneration, lumbar region: Secondary | ICD-10-CM

## 2018-02-20 DIAGNOSIS — I6523 Occlusion and stenosis of bilateral carotid arteries: Secondary | ICD-10-CM

## 2018-02-20 DIAGNOSIS — M533 Sacrococcygeal disorders, not elsewhere classified: Secondary | ICD-10-CM

## 2018-02-20 NOTE — Progress Notes (Signed)
Office Visit Note   Patient: [name]           Date of Birth: 15-Apr-1946           MRN: [id number] Visit Date: 02/20/2018              Requested by: Laurey Morale, MD [place], [place] [address] PCP: Laurey Morale, MD   Assessment & Plan: Visit Diagnoses:  1. Chronic right SI joint pain   2. Degenerative disc disease, lumbar     Plan: With patient's complaint of increased pain medially over the right SI joint I will send her for diagnostic/therapeutic SI joint Marcaine/Depo-Medrol injection.  She will follow-up me in 4 weeks for recheck.  I did ask her to pay close attention to how she feels immediately after the injection if she does have good response with the SI joint injection but pain returns we will see about doing radiofrequency ablation.  If no change in her pain we may consider repeating lumbar ESI.  Follow-Up Instructions: Return in about 4 weeks (around 03/20/2018) for with [facility] for recheck si joint pain.   Orders:  Orders Placed This Encounter  Procedures  . DL FLUORO GUIDED NEEDLE PLC ASPIRATION / INJECTTION/LOC   No orders of the defined types were placed in this encounter.     Procedures: No procedures performed   Clinical Data: No additional findings.   Subjective: Chief Complaint  Patient presents with  . Lower Back - Pain    HPI 72 year old white female returns for recheck after having right L3-4 transforaminal ESI January 15, 2018 at [place].  Patient states that she continues to have about 70 to 80% improvement after the injection.  No complaint of leg pain but she localizes most of her pain over the right SI joint.  Aggravated with ambulation, sitting.  Review of systems : no current cardiac pulmonary GI GU issues  Objective: Vital Signs: There were no vitals taken for this visit.  Physical Exam  Constitutional: She is oriented to person, place, and time. No distress.  HENT:  Head: Normocephalic and  atraumatic.  Eyes: Pupils are equal, round, and reactive to light.  Musculoskeletal:  Marked tenderness over the right SI joint and moderate on the left side.  Mild to moderately tender over the right hip greater trochanter bursa.  Negative logroll.  Negative straight leg raise.  No focal motor deficits.  Neurovascular intact.  Neurological: She is alert and oriented to person, place, and time.  Skin: Skin is warm and dry.  Psychiatric: She has a normal mood and affect.    Ortho Exam  Specialty Comments:  No specialty comments available.  Imaging: No results found.   PMFS History: Patient Active Problem List   Diagnosis Date Noted  . Hyperlipidemia 07/17/2017  . History of lumbar spinal fusion   . History of fusion of lumbar spine   . Anemia due to blood loss 11/02/2016    Class: Acute  . Lethargy 10/31/2016  . AKI (acute kidney injury) ([place]) 10/31/2016  . Chronic diastolic CHF (congestive heart failure) ([place]) 10/31/2016  . Cough   . Leukocytosis   . Spondylolisthesis, lumbar region 10/30/2016    Class: Chronic  . Spinal stenosis of lumbar region 10/30/2016    Class: Chronic  . Retained orthopedic hardware 10/30/2016    Class: Chronic  . Spinal stenosis of lumbar region with neurogenic claudication 10/30/2016  . Genetic testing 09/14/2015  . Atypical ductal  hyperplasia of left breast 08/24/2015  . Family history of breast cancer   . Family history of prostate cancer   . Family history of stomach cancer   . Abnormal nuclear stress test 12/29/2014  . Cervical spondylosis with myelopathy and radiculopathy 06/11/2014  . Carpal tunnel syndrome 05/14/2014  . Aftercare following surgery of the circulatory system, [place] 03/11/2014  . Pain of right lower extremity 03/11/2014  . Atypical lobular hyperplasia of left breast 02/12/2014  . Fibromyalgia 09/21/2013  . Carotid artery disease ([place]) 02/29/2012  . Back abscess 01/22/2012  . Chest pain with moderate risk of acute  coronary syndrome 12/09/2009  . Shortness of breath 06/10/2009  . Orthostatic hypotension 01/31/2009  . CAD (coronary artery disease) 12/23/2008  . Normocytic anemia 07/21/2008  . Headache(784.0) 07/21/2008  . History of TIA (transient ischemic attack) 07/21/2008  . Edema 02/27/2008  . Diabetes mellitus with complication ([place]) 50/27/7412  . Depression 07/16/2007  . Dyslipidemia 02/21/2007  . Essential hypertension 02/21/2007  . ASTHMA 02/21/2007  . GERD 02/21/2007  . LOW BACK PAIN 02/21/2007   Past Medical History:  Diagnosis Date  . Abnormal nuclear stress test 12/29/2014  . Allergy   . Anemia, unspecified   . Arthritis   . Blood transfusion without reported diagnosis    2018  . CAD (coronary artery disease)    sees. Dr. Burt Knack. s/p cath with PCI of RCA (xience des) June 2010. Pt also with LAD and D2 dzs-NL FFR in both areas med Rx  . Carotid artery occlusion   . Cataract    sees Dr. Herbert Deaner   . Depressive disorder, not elsewhere classified   . Duodenal nodule   . Edema   . Fibromyalgia   . Gastric polyps    COLON  . GERD (gastroesophageal reflux disease)   . Glaucoma    narrow angle, sees Dr. Herbert Deaner   . Headache(784.0)   . Hearing loss    left ear  . Heart murmur    questionable  . Hemorrhoids   . HSV (herpes simplex virus) anogenital infection 05/2014  . HTN (hypertension)   . Hyperlipidemia   . Low back pain   . Obesity   . Osteopenia 01/2018   T score -1.6 FRAX 7.1% / 1.4%  . Peripheral vascular disease ([place])   . Stroke [facility])    TIA  "many yrs ago"    . TIA (transient ischemic attack)    also Hx of it.   . Type II or unspecified type diabetes mellitus without mention of complication, not stated as uncontrolled    sees Dr. Carrolyn Meiers     Family History  Problem Relation Age of Onset  . Hypertension Mother   . Hypertension Sister   . Hyperlipidemia Sister   . Hypertension Brother   . Cancer Brother        PROSTATE/LUNG  . Diabetes Brother   .  Heart disease Brother        Before age 74 - Bypass  . Varicose Veins Brother   . Cancer Father        Prostate and pancreatic   . Stomach cancer Maternal Grandmother 58  . Heart disease Maternal Grandfather   . Breast cancer Maternal Aunt 76  . Cancer Maternal Aunt        Stomach  . Breast cancer Cousin 76       maternal first cousin  . Prostate cancer Cousin 48  . Breast cancer Cousin 69  . Esophageal cancer Cousin   .  Breast cancer Cousin 40       maternal first cousin's daughter  . Coronary artery disease Neg Hx        premature CAD  . Colon cancer Neg Hx   . Colon polyps Neg Hx   . Rectal cancer Neg Hx     Past Surgical History:  Procedure Laterality Date  . ABDOMINAL HYSTERECTOMY  age 41   TAH and USO  Leiomyomata  . ANTERIOR CERVICAL CORPECTOMY N/A 06/11/2014   Procedure: ANTERIOR CERVICAL CORPECTOMY CERVICAL SIX; WITH CERVICAL FIVE TO CERVICAL SEVEN ARTHRODESIS;  Surgeon: Ashok Pall, MD;  Location: [facility];  Service: Neurosurgery;  Laterality: N/A;  . BACK SURGERY  2008   lumb fusion  . BREAST BIOPSY Left 01/26/2014   Procedure: EXCISION LEFT BREAST MASS;  Surgeon: Adin Hector, MD;  Location: [facility];  Service: General;  Laterality: Left;  . BREAST LUMPECTOMY WITH RADIOACTIVE SEED LOCALIZATION Left 07/24/2017   Procedure: LEFT BREAST LUMPECTOMY WITH RADIOACTIVE SEED LOCALIZATION ERAS;  Surgeon: Erroll Luna, MD;  Location: [facility];  Service: General;  Laterality: Left;  . CARDIAC CATHETERIZATION    . CARDIAC CATHETERIZATION N/A 12/29/2014   Procedure: Left Heart Cath and Coronary Angiography;  Surgeon: Sherren Mocha, MD;  Location: [facility] CV LAB;  Service: Cardiovascular;  Laterality: N/A;  . CAROTID ENDARTERECTOMY  march 2012   per Dr. Morton Amy  . CARPAL TUNNEL RELEASE Left 05/14/2014   Procedure: Left Carpal tunnel release;  Surgeon: Ashok Pall, MD;  Location: [facility];  Service: Neurosurgery;   Laterality: Left;  Left Carpal tunnel release  . CARPAL TUNNEL RELEASE Bilateral   . CHOLECYSTECTOMY    . COLONOSCOPY  05-29-11   per Dr. Henrene Pastor, benign polyps, repeat in 5 yrs    . CORONARY STENT PLACEMENT     Dr. Burt Knack  . DILATION AND CURETTAGE OF UTERUS    . ESOPHAGOGASTRODUODENOSCOPY  02-08-06   per Dr. Henrene Pastor, gastritis   . EYE SURGERY Left June 2016   Cataract  . GLAUCOMA SURGERY     with laser, per Dr. Henrene Pastor   . HARDWARE REMOVAL  10/30/2016   Procedure: HARDWARE REMOVAL;  Surgeon: Jessy Oto, MD;  Location: [facility];  Service: Orthopedics;;  . LUMBAR FUSION  2008  . POLYPECTOMY    . POSTERIOR LUMBAR FUSION  10/30/2016   Removal of hardware rods and pedicle screws lumbar four, Extension of fusion L4-5 to L5-S1 with reinsertion of screws at L 4, left L5-S1 transforaminal lumbar interbody fusion, Exploration  left L5 nerve root, bilateral decompressive laminectomy L3-4/notes 10/30/2016  . ULNAR NERVE TRANSPOSITION Left 05/14/2014   Procedure: Left Ulnar nerve decompression;  Surgeon: Ashok Pall, MD;  Location: [facility];  Service: Neurosurgery;  Laterality: Left;  Left Ulnar nerve decompression   Social History   Occupational History  . Not on file  Tobacco Use  . Smoking status: Former Smoker    Last attempt to quit: 01/22/1967    Years since quitting: 51.1  . Smokeless tobacco: Never Used  Substance and Sexual Activity  . Alcohol use: No    Alcohol/week: 0.0 standard drinks  . Drug use: No  . Sexual activity: Never    Birth control/protection: Surgical, Post-menopausal    Comment: HYST-1st intercourse 89 yo-5 partners

## 2018-02-21 ENCOUNTER — Ambulatory Visit: Payer: Medicare Other | Admitting: Family

## 2018-02-21 ENCOUNTER — Encounter: Payer: Self-pay | Admitting: Family

## 2018-02-21 ENCOUNTER — Ambulatory Visit (HOSPITAL_COMMUNITY): Payer: Medicare Other | Attending: Vascular Surgery

## 2018-02-28 ENCOUNTER — Inpatient Hospital Stay: Payer: Medicare Other | Admitting: Oncology

## 2018-02-28 ENCOUNTER — Inpatient Hospital Stay: Payer: Medicare Other | Attending: Hematology | Admitting: Hematology

## 2018-03-04 ENCOUNTER — Other Ambulatory Visit (INDEPENDENT_AMBULATORY_CARE_PROVIDER_SITE_OTHER): Payer: Self-pay | Admitting: Surgery

## 2018-03-05 ENCOUNTER — Ambulatory Visit
Admission: RE | Admit: 2018-03-05 | Discharge: 2018-03-05 | Disposition: A | Discharge status: Home / Self Care | Payer: Medicare Other | Source: Ambulatory Visit | Attending: Surgery | Admitting: Surgery

## 2018-03-05 DIAGNOSIS — M533 Sacrococcygeal disorders, not elsewhere classified: Principal | ICD-10-CM

## 2018-03-05 DIAGNOSIS — G8929 Other chronic pain: Secondary | ICD-10-CM

## 2018-03-05 MED ORDER — IOPAMIDOL (ISOVUE-M 200) INJECTION 41%
1.0000 mL | Freq: Once | INTRAMUSCULAR | Status: AC
  Administered 2018-03-05: 1 mL via INTRA_ARTICULAR

## 2018-03-05 MED ORDER — METHYLPREDNISOLONE ACETATE 40 MG/ML INJ SUSP (RADIOLOG
120.0000 mg | Freq: Once | INTRAMUSCULAR | Status: AC
  Administered 2018-03-05: 120 mg via INTRA_ARTICULAR

## 2018-03-05 NOTE — Discharge Instructions (Signed)

## 2018-03-06 ENCOUNTER — Other Ambulatory Visit: Payer: Self-pay | Admitting: Cardiovascular Disease

## 2018-03-12 ENCOUNTER — Telehealth: Payer: Self-pay | Admitting: Family Medicine

## 2018-03-12 NOTE — Telephone Encounter (Unsigned)
Copied from [place] [id number]. Topic: Quick Communication - See Telephone Encounter >> Mar 12, 2018 10:19 AM Neva Seat wrote: Pt received a letter regarding her A1C.  Pt's doctor - Dr. Roque Cash checks her A1C.

## 2018-03-13 ENCOUNTER — Telehealth: Payer: Self-pay | Admitting: Hematology

## 2018-03-13 NOTE — Telephone Encounter (Signed)
Patient left vmail.  Called patient back.  She missed an appt with Dr. Burr Medico and wanted to reschedule.  Made appt for 9/13 @ 9:45.

## 2018-03-19 NOTE — Progress Notes (Signed)
[facility] NOTE 03/21/2018   Patient Care Team: Laurey Morale, MD as PCP - Cyndia Diver, MD as PCP - Cardiology (Cardiology) Reynold Bowen, MD as Consulting Physician (Endocrinology) Ashok Pall, MD as Consulting Physician (Neurosurgery) Earlie Server, MD as Consulting Physician (Orthopedic Surgery) Fanny Skates, MD (Surgeon) [place] (Gynecologist)  CHIEF COMPLAINTS:  Follow up Atypical Lobular Hyperplasia [facility]) of the left breast   HISTORY OF INITIAL PRESENTING ILLNESS:   [name] 72 y.o. female from [place], [name] is here because of recent diagnosis of Left breast Wilson which is a precursor lesion and imparts a higher future risk for developing invasive and non-invasive breast cancers in future. She also have several comorbid conditions like Diabetes, HTN, Chronic back pain, Coronary artery disease (1 stent), TIA and fibromyalgia.  She had history of a right breast biopsy on July 27,2010 consistent with benign stromal calcifications and no malignancy. She noticed a lump in left breast this year in May 2015 as she keeps her extra money in bra and she noticed a change in her breast when she was removing money. She underwent a 3D diagnostic mammogram and an ultrasound on 11/18/2013 read as benign and there was no sonographic evidence of malignancy. Patient saw Dr Dalbert Batman in surgery after being referred by Dr Phineas Real for this palpable lump with negative imaging studies.  She then had a lumpectomy done on 01/26/2014 and the pathology from that showed [place] (Atypical Lobular Hyperplasia). There was no invasive cancer or cancer in situ and margins were negative.Pathology accession no is [id number]. She is now referred to high risk breast clinic to discuss chemoprevention strategies.she is up to date with her pap smear, colonoscopy and bone density.  In terms of breast cancer risk profile:  She menarched at early age of 72 and went to  menopause at age 7 (TAH due to fibroids). She had 2 pregnancies resulting in miscarriages, no biological children, 1 adopted boy.  She did not breast-fed.  She received birth control pills for couple of years and also had IUD placed.  She was never exposed to fertility medications but took HRT for few years.  She has + family history of Breast cancer. 2 maternal cousins developed breast cancer (age 46's and 4's) and her maternal aunt had breast cancer also. 1 brother with prostate cancer. 1 sister healthy as a horse. Father had prostate cancer and stomach cancer. Adopted boy is 23 years old. Mother alive at age 71 with early dementia.No ovarian cancer in family.  PREVIOUS THERAPY: She tried anastrozole in 11/2014 and Tamoxifen in 03/2015 for about one month each, and stopped due to side effects.   CURRENT THERAPY: observation   INTERIM HISTORY:  She returns for follow up. She had left breast lumpectomy with Dr. Brantley Stage on 07/26/2017. She has a history of recurrent vaginal abscesses. She is here today alone at the clinic. She cannot hear with her left ear and she has seen ENT. She is very happy about her biopsy results. She denies breast pain or new palpable masses. She is doing well.  She sees [facility]ane Jews at [facility] to get her anemia checked, but has not seen him this year. She denies feeling tired, but complains of back pain which she relates to surgery. She can still go upstairs, but is afraid to fall and uses a cane to stabilize.  She had PT at home after her surgery and states that it took her long to recover.   MEDICAL  HISTORY:  Past Medical History:  Diagnosis Date  . Abnormal nuclear stress test 12/29/2014  . Allergy   . Anemia, unspecified   . Arthritis   . Blood transfusion without reported diagnosis    2018  . CAD (coronary artery disease)    sees. Dr. Burt Knack. s/p cath with PCI of RCA (xience des) June 2010. Pt also with LAD and D2 dzs-NL FFR in both areas med Rx  . Carotid artery  occlusion   . Cataract    sees Dr. Herbert Deaner   . Depressive disorder, not elsewhere classified   . Duodenal nodule   . Edema   . Fibromyalgia   . Gastric polyps    COLON  . GERD (gastroesophageal reflux disease)   . Glaucoma    narrow angle, sees Dr. Herbert Deaner   . Headache(784.0)   . Hearing loss    left ear  . Heart murmur    questionable  . Hemorrhoids   . HSV (herpes simplex virus) anogenital infection 05/2014  . HTN (hypertension)   . Hyperlipidemia   . Low back pain   . Obesity   . Osteopenia 01/2018   T score -1.6 FRAX 7.1% / 1.4%  . Peripheral vascular disease ([place])   . Stroke [facility])    TIA  "many yrs ago"    . TIA (transient ischemic attack)    also Hx of it.   . Type II or unspecified type diabetes mellitus without mention of complication, not stated as uncontrolled    sees Dr. Carrolyn Meiers     SURGICAL HISTORY: Past Surgical History:  Procedure Laterality Date  . ABDOMINAL HYSTERECTOMY  age 60   TAH and USO  Leiomyomata  . ANTERIOR CERVICAL CORPECTOMY N/A 06/11/2014   Procedure: ANTERIOR CERVICAL CORPECTOMY CERVICAL SIX; WITH CERVICAL FIVE TO CERVICAL SEVEN ARTHRODESIS;  Surgeon: Ashok Pall, MD;  Location: [facility] [place] [facility]URO ORS;  Service: Neurosurgery;  Laterality: N/A;  . BACK SURGERY  2008   lumb fusion  . BREAST BIOPSY Left 01/26/2014   Procedure: EXCISION LEFT BREAST MASS;  Surgeon: Adin Hector, MD;  Location: [place];  Service: General;  Laterality: Left;  . BREAST LUMPECTOMY WITH RADIOACTIVE SEED LOCALIZATION Left 07/24/2017   Procedure: LEFT BREAST LUMPECTOMY WITH RADIOACTIVE SEED LOCALIZATION ERAS;  Surgeon: Erroll Luna, MD;  Location: [place];  Service: General;  Laterality: Left;  . CARDIAC CATHETERIZATION    . CARDIAC CATHETERIZATION N/A 12/29/2014   Procedure: Left Heart Cath and Coronary Angiography;  Surgeon: Sherren Mocha, MD;  Location: [place] CV LAB;  Service: Cardiovascular;  Laterality: N/A;  . CAROTID  ENDARTERECTOMY  march 2012   per Dr. Morton Amy  . CARPAL TUNNEL RELEASE Left 05/14/2014   Procedure: Left Carpal tunnel release;  Surgeon: Ashok Pall, MD;  Location: [place] [facility];  Service: Neurosurgery;  Laterality: Left;  Left Carpal tunnel release  . CARPAL TUNNEL RELEASE Bilateral   . CHOLECYSTECTOMY    . COLONOSCOPY  05-29-11   per Dr. Henrene Pastor, benign polyps, repeat in 5 yrs    . CORONARY STENT PLACEMENT     Dr. Burt Knack  . DILATION AND CURETTAGE OF UTERUS    . ESOPHAGOGASTRODUODENOSCOPY  02-08-06   per Dr. Henrene Pastor, gastritis   . EYE SURGERY Left June 2016   Cataract  . GLAUCOMA SURGERY     with laser, per Dr. Henrene Pastor   . HARDWARE REMOVAL  10/30/2016   Procedure: HARDWARE REMOVAL;  Surgeon: Jessy Oto, MD;  Location:  [place] OR;  Service: Orthopedics;;  . LUMBAR FUSION  2008  . POLYPECTOMY    . POSTERIOR LUMBAR FUSION  10/30/2016   Removal of hardware rods and pedicle screws lumbar four, Extension of fusion L4-5 to L5-S1 with reinsertion of screws at L 4, left L5-S1 transforaminal lumbar interbody fusion, Exploration  left L5 nerve root, bilateral decompressive laminectomy L3-4/notes 10/30/2016  . ULNAR NERVE TRANSPOSITION Left 05/14/2014   Procedure: Left Ulnar nerve decompression;  Surgeon: Ashok Pall, MD;  Location: [place] NEURO ORS;  Service: Neurosurgery;  Laterality: Left;  Left Ulnar nerve decompression    SOCIAL HISTORY: Social History   Socioeconomic History  . Marital status: Married    Spouse name: Not on file  . Number of children: 1  . Years of education: Not on file  . Highest education level: Not on file  Occupational History  . Not on file  Social Needs  . Financial resource strain: Not on file  . Food insecurity:    Worry: Not on file    Inability: Not on file  . Transportation needs:    Medical: Not on file    Non-medical: Not on file  Tobacco Use  . Smoking status: Former Smoker    Last attempt to quit: 01/22/1967    Years since quitting: 51.1  .  Smokeless tobacco: Never Used  Substance and Sexual Activity  . Alcohol use: No    Alcohol/week: 0.0 standard drinks  . Drug use: No  . Sexual activity: Never    Birth control/protection: Surgical, Post-menopausal    Comment: HYST-1st intercourse 40 yo-5 partners  Lifestyle  . Physical activity:    Days per week: Not on file    Minutes per session: Not on file  . Stress: Not on file  Relationships  . Social connections:    Talks on phone: Not on file    Gets together: Not on file    Attends religious service: Not on file    Active member of club or organization: Not on file    Attends meetings of clubs or organizations: Not on file    Relationship status: Not on file  . Intimate partner violence:    Fear of current or ex partner: Not on file    Emotionally abused: Not on file    Physically abused: Not on file    Forced sexual activity: Not on file  Other Topics Concern  . Not on file  Social History Narrative  . Not on file    FAMILY HISTORY: Family History  Problem Relation Age of Onset  . Hypertension Mother   . Hypertension Sister   . Hyperlipidemia Sister   . Hypertension Brother   . Cancer Brother        PROSTATE/LUNG  . Diabetes Brother   . Heart disease Brother        Before age 65 - Bypass  . Varicose Veins Brother   . Cancer Father        Prostate and pancreatic   . Stomach cancer Maternal Grandmother 46  . Heart disease Maternal Grandfather   . Breast cancer Maternal Aunt 40  . Cancer Maternal Aunt        Stomach  . Breast cancer Cousin 45       maternal first cousin  . Prostate cancer Cousin 53  . Breast cancer Cousin 63  . Esophageal cancer Cousin   . Breast cancer Cousin 40       maternal first cousin's daughter  .  Coronary artery disease Neg Hx        premature CAD  . Colon cancer Neg Hx   . Colon polyps Neg Hx   . Rectal cancer Neg Hx     ALLERGIES:  is allergic to no known allergies.  MEDICATIONS:  Current Outpatient Medications   Medication Sig Dispense Refill  . albuterol (PROVENTIL HFA;VENTOLIN HFA) 108 (90 Base) MCG/ACT inhaler Inhale 2 puffs into the lungs every 4 (four) hours as needed for wheezing or shortness of breath. 1 Inhaler 2  . aspirin 81 MG tablet Take 81 mg by mouth daily.     . diclofenac sodium (VOLTAREN) 1 % GEL Apply 2 g topically 4 (four) times daily as needed (for pain).     Marland Kitchen doxycycline (VIBRA-TABS) 100 MG tablet TAKE 1 TABLET BY MOUTH TWICE DAILY WITH FOOD AND WATER(SUN WARNING)  1  . doxycycline (VIBRAMYCIN) 100 MG capsule Take 1 capsule (100 mg total) by mouth 2 (two) times daily. 20 capsule 0  . ergocalciferol (VITAMIN D2) 50000 UNITS capsule Take 50,000 Units by mouth every Saturday. On Saturday.    . fluocinonide (LIDEX) 0.05 % external solution     . furosemide (LASIX) 40 MG tablet TAKE ONE TABLET BY MOUTH ONCE DAILY 30 tablet 0  . HUMALOG 100 UNIT/ML injection     . hydrALAZINE (APRESOLINE) 100 MG tablet TAKE 1 TABLET BY MOUTH THREE TIMES DAILY 270 tablet 2  . HYDROcodone-acetaminophen (NORCO/VICODIN) 5-325 MG tablet Take by mouth.    Marland Kitchen HYDROcodone-acetaminophen (NORCO/VICODIN) 5-325 MG tablet Take 1-2 tablets by mouth every 6 (six) hours as needed for moderate pain. 12 tablet 0  . insulin aspart (NOVOLOG) 100 UNIT/ML injection Inject into the skin.    . Insulin Disposable Pump (V-GO 40) KIT   2  . Insulin Human (INSULIN PUMP) SOLN Inject into the skin. Uses Novolog insulin for pump    . irbesartan (AVAPRO) 300 MG tablet TAKE 1 TABLET BY MOUTH ONCE DAILY 30 tablet 12  . isosorbide mononitrate (IMDUR) 60 MG 24 hr tablet TAKE 1 TABLET BY MOUTH ONCE DAILY 90 tablet 2  . latanoprost (XALATAN) 0.005 % ophthalmic solution Place 1 drop into both eyes at bedtime.     . metoprolol succinate (TOPROL-XL) 100 MG 24 hr tablet TAKE 1 TABLET BY MOUTH ONCE DAILY OR  IMMEDIATELY  FOLLOWING  A  MEAL 90 tablet 2  . montelukast (SINGULAIR) 10 MG tablet Take 10 mg by mouth daily.    . naproxen (NAPROSYN) 500  MG tablet Take 1 tablet (500 mg total) by mouth 2 (two) times daily with a meal. 60 tablet 2  . nitroGLYCERIN (NITROSTAT) 0.4 MG SL tablet Place 1 tablet (0.4 mg total) under the tongue every 5 (five) minutes as needed for chest pain. 25 tablet 1  . nystatin-triamcinolone (MYCOLOG II) cream Apply 1 application topically 2 (two) times daily. 30 g 2  . pantoprazole (PROTONIX) 40 MG tablet Take 40 mg by mouth daily.     . rosuvastatin (CRESTOR) 40 MG tablet TAKE 1 TABLET BY MOUTH ONCE DAILY 90 tablet 3  . spironolactone (ALDACTONE) 25 MG tablet Take 1 tablet (25 mg total) by mouth daily. 90 tablet 2  . traMADol (ULTRAM) 50 MG tablet Take 1 tablet (50 mg total) by mouth every 6 (six) hours as needed. 40 tablet 0  . isosorbide mononitrate (IMDUR) 30 MG 24 hr tablet Take 1 tablet (30 mg total) by mouth daily. 90 tablet 3   No current facility-administered medications  for this visit.    REVIEW OF SYSTEMS:   Constitutional: Denies fevers, chills or abnormal night sweats Eyes: Denies blurriness of vision, double vision or watery eyes  Ears, nose, mouth, throat, and face: Denies mucositis or sore throat (+) left ear hearing loss Respiratory: Denies cough, dyspnea or wheezes Cardiovascular: Denies palpitation, chest discomfort or lower extremity swelling Gastrointestinal:  Denies nausea, heartburn or change in bowel habits Skin: Denies abnormal skin rashes Lymphatics: Denies new lymphadenopathy or easy bruising Neurological:Denies numbness, tingling or new weaknesses MSK: (+) back pain from previous surgery Behavioral/Psych: Mood is stable, no new changes  All other systems were reviewed with the patient and are negative.   PHYSICAL EXAMINATION: ECOG PERFORMANCE STATUS: 1  Vitals:   03/21/18 1012  BP: (!) 158/51  Pulse: 61  Resp: 17  Temp: 98.8 F (37.1 C)  SpO2: 100%   Filed Weights   03/21/18 1012  Weight: 190 lb 11.2 oz (86.5 kg)    GENERAL:alert, no distress and comfortable (+) on  a wheelchair today and usually uses cane to walk SKIN: skin color, texture, turgor are normal, no rashes or significant lesions EYES: normal, conjunctiva are pink and non-injected, sclera clear EARS: (+) left ear hearing loss OROPHARYNX:no exudate, no erythema and lips, buccal mucosa, and tongue normal  NECK: supple, thyroid normal size, non-tender, without nodularity LYMPH:  no palpable lymphadenopathy in the cervical, axillary or inguinal LUNGS: clear to auscultation and percussion with normal breathing effort HEART: regular rate & rhythm and no murmurs and no lower extremity edema ABDOMEN:abdomen soft, non-tender and normal bowel sounds Musculoskeletal:no cyanosis of digits and no clubbing  BREAST: (+) previous surgical scar with 5x2.5 mass under the incision at 10 clock position of left breast, like scar tissue PSYCH: alert & oriented x 3 with fluent speech NEURO: no focal motor/sensory deficits    LABORATORY DATA:  CBC Latest Ref Rng & Units 03/26/2017 11/19/2016 11/03/2016  WBC 4.0 - 10.5 K/uL 8.8 8.2 14.7(H)  Hemoglobin 12.0 - 15.0 g/dL 12.3 10.9(L) 11.0(L)  Hematocrit 36.0 - 46.0 % 38.3 34.7(L) 32.7(L)  Platelets 150.0 - 400.0 K/uL 184.0 317 165    CMP Latest Ref Rng & Units 03/26/2017 11/02/2016 11/01/2016  Glucose 70 - 99 mg/dL 210(H) 125(H) 162(H)  BUN 6 - 23 mg/dL 17 23(H) 20  Creatinine 0.40 - 1.20 mg/dL 0.91 1.19(H) 1.26(H)  Sodium 135 - 145 mEq/L 144 139 138  Potassium 3.5 - 5.1 mEq/L 3.8 3.4(L) 3.5  Chloride 96 - 112 mEq/L 106 108 108  CO2 19 - 32 mEq/L 30 24 21(L)  Calcium 8.4 - 10.5 mg/dL 9.7 8.1(L) 7.8(L)  Total Protein 6.0 - 8.3 g/dL 6.5 - 5.0(L)  Total Bilirubin 0.2 - 1.2 mg/dL 0.9 - 0.7  Alkaline Phos 39 - 117 U/L 81 - 54  AST 0 - 37 U/L 14 - 31  ALT 0 - 35 U/L 11 - 10(L)   Surgery 07/24/2017 LEFT BREAST LUMPECTOMY WITH RADIOACTIVE SEED LOCALIZATION ERAS with Erroll Luna, MD   PATHOLOGY  07/24/2017 Surgical Pathology Diagnosis Breast, lumpectomy,  Left - FIBROCYSTIC CHANGES. - HEALING BIOPSY SITE. - THERE IS NO EVIDENCE OF MALIGNANCY. - SEE COMMENT. Microscopic Comment The surgical resection margin(s) of the specimen were inked and microscopically evaluated. Specimen(s) Obtained: Breast, lumpectomy, Left Specimen Clinical Information Left breast mass (nt) Gross Specimen type: Left breast seed localization lumpectomy, received fresh. The specimen is placed in formalin at 9:35 a.m. on 07/24/17. The seed is received separate from the tissue. Size: 5.8  cm at the anterior-posterior axis, 5.7 cm at the superior-inferior axis and 2.8 cm at the lateral-medial axis. Orientation: The specimen is oriented with previously applied inks (anterior green, inferior blue, lateral orange, medial yellow, posterior black, superior red). Localized area: There is a pin inserted in the specimen identifying the biopsy clip. Cut surface: At the localized area, there is a 2.2 x 1.2 x 1.2 cm hemorrhagic nodule with surrounding fat necrosis. A discrete mass is not grossly identified. The biopsy clip is present. The surrounding breast tissue consists of soft yellow adipose tissue and dense white fibrous tissue. Margins: The hemorrhagic nodule and fat necrosis extends to the superior margin and extends to within 0.2 cm of the lateral and medial margins adjacent to the superior margin. Prognostic indicators: Obtain from paraffin blocks if needed. Block summary: Ten blocks submitted. A-H = entire hemorrhagic nodule in area of fat necrosis to include the superior, lateral and medial margins I = anterior and posterior margins J = inferior margin. (GP:ah 07/24/17) Report signed out from the following location(s) Technical component and interpretation was performed at [facility], [place], [place] [address]. CLIA #: [id number],  06/27/2017 Surgical Pathology Diagnosis 1. Breast, left, needle core biopsy, posterior upper outer  quadrant - FIBROCYSTIC CHANGES. - THERE IS NO EVIDENCE OF MALIGNANCY. - SEE COMMENT. 2. Breast, left, needle core biopsy, anterior lower inner quadrant - FIBROCYSTIC CHANGES. - THERE IS NO EVIDENCE OF MALIGNANCY. - SEE COMMENT. Microscopic Comment 1. , 2. The results were called to [facility] on 06/28/2017. (JBK:ecj 06/29/1027)nical Information Specimen Comment 1. Formalin time: 8:35 AM; extracted < 5 min; enhancing mass bow tie clip 2. Non mass linear enhancement cylinder clip Specimen(s) Obtained: 1. Breast, left, needle core biopsy, posterior upper outer quadrant 2. Breast, left, needle core biopsy, anterior lower inner quadrant Specimen Clinical Information 1. Possible IMC 2. Possible FCC or DCIS Gross 1. Received labeled as "Vanyo,Debbrah" and "Left post upper outer" (TIF 0835 CIT <5 min) are multiple cores and irregular pieces of yellow to gray white soft tissue, aggregating 3 x 1.6 x 0.3 cm. Total 2 blocks (SSW 12/20) 2. Received labeled as "Wind,Meghin" and "L ant lower inner" (TIF 0835 CIT <5 min) are multiple cores and irregular pieces of yellow to gray white soft to rubbery tissue, aggregating 2.5 x 2.2 x 0.3 cm. Total 2 blocks (SSW 12/20)   RADIOGRAPHIC STUDIES: I have personally reviewed the radiological images as listed and agreed with the findings in the report.  Mammogram 11/22/2014: (-)   MRI Brain w w/o contrast 04/07/15 IMPRESSION: 1.  No acute intracranial abnormality.  No brain mass identified. 2. Progression of chronic small vessel disease since 2009, moderate for age. Possible involvement of the right optic radiation.  ASSESSMENT:   72 y.o.  female with diagnosis of ALH (Atypical Lobular Hyperplasia) seen on a Lumpectomy of left breast. This was done for a palpable lump which was negative on mammogram and ultrasound. She had previously a right breast biopsy which was also negative for malignancy.   1. Left breast ALH  -Dr.  Lona Kettle has previously extensively discussed the risks of developing breast cancer and chemoprevention with tamoxifen versus anastrozole. The overall benefit of chemoprevention is reduce her breast cancer by 50% with absolute benefit 3.5-4% risk reduction. She agreed to take chemoprevention  - she has tried anastrozole and tamoxifen but could not tolerate due to side effects. -we discussed  alternative chemopevention with  Raloxifene, and she  decided not to try  -She has strong family history of breast cancer, but her genetic testing was negative. -continue annual screening mammogram, her last one in Sep 2017, which was normal. She is due, I'll set up for her annual mammogram at [facility] for next month. -Due to her dense breast tissue (category C), and high risk for breast cancer, I recommend her to have breast MRI every year. I will set up for next year.  Her breast MRI in 2018 showed a mass which was not seen on the screening mammogram, biopsy into surgical resection showed fibrocystic change, no malignancy. -I will order a mammogram for 2019 to be done next month -f/u in one year -I encouraged her to continue healthy diet and exercise, when she recovers well from her back surgery.   2. Iron deficient anemia -she has developed moderate microcytic anemia in the past 3-4 month, ferritin 11, low iron and sat, supports iron deficient anemia. Possible related to her prior surgery.    -continue ferrous sulfate 325 mg 2-3 times a day. -Her hemoglobin has normalized, serum iron and ferritin level also normalized, I encouraged her to continue oral iron pill. -She had repeated her colonoscopy in December 2018, which showed a 3 mm polyps in descending colon removed, diverticula, and internal hemorrhoids. -I recommend her to repeat CBC and iron study, she prefers to have it done with Dr. Sarajane Jews next months.   3. HTN, DM, CAD, history of TIA -She will continue follow-up with her primary care physician  4.  Depression  -Will consult with social worker for further management of this.   Plan -M[facility]ogram to [facility] next [facility] -breast MRI next year in March  -F/u in one year.   Dierdre Searles Dweik am acting as scribe for Dr. Truitt Merle.  I have reviewed the above documentation for accuracy and completeness, and I agree with the above.    Truitt Merle 03/21/2018

## 2018-03-20 ENCOUNTER — Encounter (INDEPENDENT_AMBULATORY_CARE_PROVIDER_SITE_OTHER): Payer: Self-pay | Admitting: Surgery

## 2018-03-20 ENCOUNTER — Ambulatory Visit (INDEPENDENT_AMBULATORY_CARE_PROVIDER_SITE_OTHER): Payer: Medicare Other | Admitting: Surgery

## 2018-03-20 VITALS — BP 160/63 | HR 46 | Ht 63.5 in | Wt 193.0 lb

## 2018-03-20 DIAGNOSIS — I6523 Occlusion and stenosis of bilateral carotid arteries: Secondary | ICD-10-CM

## 2018-03-20 DIAGNOSIS — G8929 Other chronic pain: Secondary | ICD-10-CM | POA: Diagnosis not present

## 2018-03-20 DIAGNOSIS — M533 Sacrococcygeal disorders, not elsewhere classified: Secondary | ICD-10-CM | POA: Diagnosis not present

## 2018-03-20 NOTE — Progress Notes (Signed)
Office Visit Note   Patient: [name]           Date of Birth: [birth year]           MRN: [id number] Visit Date: 03/20/2018              Requested by: Laurey Morale, MD [place], [place] [address] PCP: Laurey Morale, MD   Assessment & Plan: Visit Diagnoses:  1. Chronic right SI joint pain   2. Chronic left SI joint pain     Plan: Since patient states that she is doing reasonably well after right SI joint injection will give this more time.  I think she did have good diagnostic benefit.  May consider RF ablation in the future as we previously discussed.  She has been also having some discomfort over the left SI joint recently and so we may consider left SI joint in the future as well.  Follow-up in 4 weeks for recheck.  Again patient stated that she is not interested in having any further surgeries in her lumbar spine.  If she does describe a lumbar radicular pattern we will consider sending her back for a repeat lumbar ESI.  Follow-Up Instructions: Return in about 4 weeks (around 04/17/2018) for with Min Collymore. .   Orders:  No orders of the defined types were placed in this encounter.  No orders of the defined types were placed in this encounter.     Procedures: No procedures performed   Clinical Data: No additional findings.   Subjective: Chief Complaint  Patient presents with  . Lower Back - Pain, Follow-up    HPI 72-year-old black female returns for recheck after right SI joint injection.  States that she did get about 70% improvement of her pain at best.  She has been complaining of some discomfort over the left SI joint over the last couple weeks.  No lower extremity radicular symptoms. Review of Systems No current cardiac pulmonary GI GU issues  Objective: Vital Signs: BP (!) 160/63   Pulse (!) 46   Ht 5' 3.5" (1.613 m)   Wt 193 lb (87.5 kg)   BMI 33.65 kg/m   Physical Exam  Constitutional: She is oriented to person, place, and  time. No distress.  HENT:  Head: Normocephalic and atraumatic.  Eyes: Pupils are equal, round, and reactive to light. EOM are normal.  Pulmonary/Chest: No respiratory distress.  Musculoskeletal:  Tenderness over the bilateral SI joints.  Negative straight leg raise.  No focal motor deficits.  Neurological: She is alert and oriented to person, place, and time.  Skin: Skin is warm and dry.  Psychiatric: She has a normal mood and affect.    Ortho Exam  Specialty Comments:  No specialty comments available.  Imaging: No results found.   PMFS History: Patient Active Problem List   Diagnosis Date Noted  . Hyperlipidemia 07/17/2017  . History of lumbar spinal fusion   . History of fusion of lumbar spine   . Anemia due to blood loss 11/02/2016    Class: Acute  . Lethargy 10/31/2016  . AKI (acute kidney injury) ([place]) 10/31/2016  . Chronic diastolic CHF (congestive heart failure) ([place]) 10/31/2016  . Cough   . Leukocytosis   . Spondylolisthesis, lumbar region 10/30/2016    Class: Chronic  . Spinal stenosis of lumbar region 10/30/2016    Class: Chronic  . Retained orthopedic hardware 10/30/2016    Class: Chronic  . Spinal stenosis of lumbar  region with neurogenic claudication 10/30/2016  . Genetic testing 09/14/2015  . Atypical ductal hyperplasia of left breast 08/24/2015  . Family history of breast cancer   . Family history of prostate cancer   . Family history of stomach cancer   . Abnormal nuclear stress test 12/29/2014  . Cervical spondylosis with myelopathy and radiculopathy 06/11/2014  . Carpal tunnel syndrome 05/14/2014  . Aftercare following surgery of the circulatory system, [place] 03/11/2014  . Pain of right lower extremity 03/11/2014  . Atypical lobular hyperplasia of left breast 02/12/2014  . Fibromyalgia 09/21/2013  . Carotid artery disease ([place]) 02/29/2012  . Back abscess 01/22/2012  . Chest pain with moderate risk of acute coronary syndrome 12/09/2009  .  Shortness of breath 06/10/2009  . Orthostatic hypotension 01/31/2009  . CAD (coronary artery disease) 12/23/2008  . Normocytic anemia 07/21/2008  . Headache(784.0) 07/21/2008  . History of TIA (transient ischemic attack) 07/21/2008  . Edema 02/27/2008  . Diabetes mellitus with complication ([place]) 78/93/8101  . Depression 07/16/2007  . Dyslipidemia 02/21/2007  . Essential hypertension 02/21/2007  . ASTHMA 02/21/2007  . GERD 02/21/2007  . LOW BACK PAIN 02/21/2007   Past Medical History:  Diagnosis Date  . Abnormal nuclear stress test 12/29/2014  . Allergy   . Anemia, unspecified   . Arthritis   . Blood transfusion without reported diagnosis    2018  . CAD (coronary artery disease)    sees. Dr. Burt Knack. s/p cath with PCI of RCA (xience des) June 2010. Pt also with LAD and D2 dzs-NL FFR in both areas med Rx  . Carotid artery occlusion   . Cataract    sees Dr. Herbert Deaner   . Depressive disorder, not elsewhere classified   . Duodenal nodule   . Edema   . Fibromyalgia   . Gastric polyps    COLON  . GERD (gastroesophageal reflux disease)   . Glaucoma    narrow angle, sees Dr. Herbert Deaner   . Headache(784.0)   . Hearing loss    left ear  . Heart murmur    questionable  . Hemorrhoids   . HSV (herpes simplex virus) anogenital infection 05/2014  . HTN (hypertension)   . Hyperlipidemia   . Low back pain   . Obesity   . Osteopenia 01/2018   T score -1.6 FRAX 7.1% / 1.4%  . Peripheral vascular disease ([place])   . Stroke [facility])    TIA  "many yrs ago"    . TIA (transient ischemic attack)    also Hx of it.   . Type II or unspecified type diabetes mellitus without mention of complication, not stated as uncontrolled    sees Dr. Carrolyn Meiers     Family History  Problem Relation Age of Onset  . Hypertension Mother   . Hypertension Sister   . Hyperlipidemia Sister   . Hypertension Brother   . Cancer Brother        PROSTATE/LUNG  . Diabetes Brother   . Heart disease Brother        Before  age 59 - Bypass  . Varicose Veins Brother   . Cancer Father        Prostate and pancreatic   . Stomach cancer Maternal Grandmother 69  . Heart disease Maternal Grandfather   . Breast cancer Maternal Aunt 82  . Cancer Maternal Aunt        Stomach  . Breast cancer Cousin 20       maternal first cousin  . Prostate  cancer Cousin 63  . Breast cancer Cousin 40  . Esophageal cancer Cousin   . Breast cancer Cousin 40       maternal first cousin's daughter  . Coronary artery disease Neg Hx        premature CAD  . Colon cancer Neg Hx   . Colon polyps Neg Hx   . Rectal cancer Neg Hx     Past Surgical History:  Procedure Laterality Date  . ABDOMINAL HYSTERECTOMY  age 17   TAH and USO  Leiomyomata  . ANTERIOR CERVICAL CORPECTOMY N/A 06/11/2014   Procedure: ANTERIOR CERVICAL CORPECTOMY CERVICAL SIX; WITH CERVICAL FIVE TO CERVICAL SEVEN ARTHRODESIS;  Surgeon: Ashok Pall, MD;  Location: [place] [facility];  Service: Neurosurgery;  Laterality: N/A;  . BACK SURGERY  2008   lumb fusion  . BREAST BIOPSY Left 01/26/2014   Procedure: EXCISION LEFT BREAST MASS;  Surgeon: Adin Hector, MD;  Location: [place];  Service: General;  Laterality: Left;  . BREAST LUMPECTOMY WITH RADIOACTIVE SEED LOCALIZATION Left 07/24/2017   Procedure: LEFT BREAST LUMPECTOMY WITH RADIOACTIVE SEED LOCALIZATION ERAS;  Surgeon: Erroll Luna, MD;  Location: [place];  Service: General;  Laterality: Left;  . CARDIAC CATHETERIZATION    . CARDIAC CATHETERIZATION N/A 12/29/2014   Procedure: Left Heart Cath and Coronary Angiography;  Surgeon: Sherren Mocha, MD;  Location: [place] CV LAB;  Service: Cardiovascular;  Laterality: N/A;  . CAROTID ENDARTERECTOMY  march 2012   per Dr. Morton Amy  . CARPAL TUNNEL RELEASE Left 05/14/2014   Procedure: Left Carpal tunnel release;  Surgeon: Ashok Pall, MD;  Location: [place] [facility];  Service: Neurosurgery;  Laterality: Left;  Left Carpal tunnel  release  . CARPAL TUNNEL RELEASE Bilateral   . CHOLECYSTECTOMY    . COLONOSCOPY  05-29-11   per Dr. Henrene Pastor, benign polyps, repeat in 5 yrs    . CORONARY STENT PLACEMENT     Dr. Burt Knack  . DILATION AND CURETTAGE OF UTERUS    . ESOPHAGOGASTRODUODENOSCOPY  02-08-06   per Dr. Henrene Pastor, gastritis   . EYE SURGERY Left June 2016   Cataract  . GLAUCOMA SURGERY     with laser, per Dr. Henrene Pastor   . HARDWARE REMOVAL  10/30/2016   Procedure: HARDWARE REMOVAL;  Surgeon: Jessy Oto, MD;  Location: [place];  Service: Orthopedics;;  . LUMBAR FUSION  2008  . POLYPECTOMY    . POSTERIOR LUMBAR FUSION  10/30/2016   Removal of hardware rods and pedicle screws lumbar four, Extension of fusion L4-5 to L5-S1 with reinsertion of screws at L 4, left L5-S1 transforaminal lumbar interbody fusion, Exploration  left L5 nerve root, bilateral decompressive laminectomy L3-4/notes 10/30/2016  . ULNAR NERVE TRANSPOSITION Left 05/14/2014   Procedure: Left Ulnar nerve decompression;  Surgeon: Ashok Pall, MD;  Location: [place] [facility];  Service: Neurosurgery;  Laterality: Left;  Left Ulnar nerve decompression   Social History   Occupational History  . Not on file  Tobacco Use  . Smoking status: Former Smoker    Last attempt to quit: 01/22/1967    Years since quitting: 51.1  . Smokeless tobacco: Never Used  Substance and Sexual Activity  . Alcohol use: No    Alcohol/week: 0.0 standard drinks  . Drug use: No  . Sexual activity: Never    Birth control/protection: Surgical, Post-menopausal    Comment: HYST-1st intercourse 18 yo-5 partners

## 2018-03-21 ENCOUNTER — Inpatient Hospital Stay: Payer: Medicare Other | Attending: Hematology | Admitting: Hematology

## 2018-03-21 ENCOUNTER — Telehealth: Payer: Self-pay | Admitting: Hematology

## 2018-03-21 ENCOUNTER — Encounter: Payer: Self-pay | Admitting: Hematology

## 2018-03-21 VITALS — BP 158/51 | HR 61 | Temp 98.8°F | Resp 17 | Ht 63.5 in | Wt 190.7 lb

## 2018-03-21 DIAGNOSIS — Z1231 Encounter for screening mammogram for malignant neoplasm of breast: Secondary | ICD-10-CM

## 2018-03-21 DIAGNOSIS — I10 Essential (primary) hypertension: Secondary | ICD-10-CM | POA: Diagnosis not present

## 2018-03-21 DIAGNOSIS — Z803 Family history of malignant neoplasm of breast: Secondary | ICD-10-CM | POA: Diagnosis not present

## 2018-03-21 DIAGNOSIS — F329 Major depressive disorder, single episode, unspecified: Secondary | ICD-10-CM | POA: Insufficient documentation

## 2018-03-21 DIAGNOSIS — D509 Iron deficiency anemia, unspecified: Secondary | ICD-10-CM | POA: Insufficient documentation

## 2018-03-21 DIAGNOSIS — Z8673 Personal history of transient ischemic attack (TIA), and cerebral infarction without residual deficits: Secondary | ICD-10-CM | POA: Diagnosis not present

## 2018-03-21 DIAGNOSIS — E119 Type 2 diabetes mellitus without complications: Secondary | ICD-10-CM | POA: Insufficient documentation

## 2018-03-21 DIAGNOSIS — I2510 Atherosclerotic heart disease of native coronary artery without angina pectoris: Secondary | ICD-10-CM | POA: Diagnosis not present

## 2018-03-21 DIAGNOSIS — N6092 Unspecified benign mammary dysplasia of left breast: Secondary | ICD-10-CM | POA: Diagnosis not present

## 2018-03-21 NOTE — Telephone Encounter (Signed)
Scheduled appt per 9/13 los - gave patient AVS and calender per los.  

## 2018-03-24 DIAGNOSIS — Z87891 Personal history of nicotine dependence: Secondary | ICD-10-CM | POA: Diagnosis not present

## 2018-03-24 DIAGNOSIS — H938X1 Other specified disorders of right ear: Secondary | ICD-10-CM | POA: Diagnosis not present

## 2018-03-24 DIAGNOSIS — H9042 Sensorineural hearing loss, unilateral, left ear, with unrestricted hearing on the contralateral side: Secondary | ICD-10-CM | POA: Diagnosis not present

## 2018-04-02 DIAGNOSIS — Z23 Encounter for immunization: Secondary | ICD-10-CM | POA: Diagnosis not present

## 2018-04-03 ENCOUNTER — Other Ambulatory Visit: Payer: Self-pay

## 2018-04-03 DIAGNOSIS — I6523 Occlusion and stenosis of bilateral carotid arteries: Secondary | ICD-10-CM

## 2018-04-03 DIAGNOSIS — Z9889 Other specified postprocedural states: Secondary | ICD-10-CM

## 2018-04-04 ENCOUNTER — Encounter (HOSPITAL_COMMUNITY): Payer: Medicare Other

## 2018-04-04 ENCOUNTER — Ambulatory Visit: Payer: Medicare Other | Admitting: Family

## 2018-04-07 ENCOUNTER — Encounter (HOSPITAL_COMMUNITY): Payer: Medicare Other

## 2018-04-07 ENCOUNTER — Ambulatory Visit: Payer: Medicare Other | Admitting: Family

## 2018-04-14 ENCOUNTER — Ambulatory Visit: Payer: Medicare Other | Admitting: Family

## 2018-04-14 ENCOUNTER — Encounter (HOSPITAL_COMMUNITY): Payer: Medicare Other

## 2018-04-15 ENCOUNTER — Encounter: Payer: Self-pay | Admitting: Family Medicine

## 2018-04-15 DIAGNOSIS — Z1231 Encounter for screening mammogram for malignant neoplasm of breast: Secondary | ICD-10-CM | POA: Diagnosis not present

## 2018-04-17 ENCOUNTER — Ambulatory Visit (INDEPENDENT_AMBULATORY_CARE_PROVIDER_SITE_OTHER): Payer: Medicare Other | Admitting: Surgery

## 2018-04-17 ENCOUNTER — Encounter (INDEPENDENT_AMBULATORY_CARE_PROVIDER_SITE_OTHER): Payer: Self-pay | Admitting: Surgery

## 2018-04-17 VITALS — BP 143/65 | HR 56 | Ht 63.5 in | Wt 190.0 lb

## 2018-04-17 DIAGNOSIS — G8929 Other chronic pain: Secondary | ICD-10-CM

## 2018-04-17 DIAGNOSIS — M533 Sacrococcygeal disorders, not elsewhere classified: Secondary | ICD-10-CM

## 2018-04-17 DIAGNOSIS — I6523 Occlusion and stenosis of bilateral carotid arteries: Secondary | ICD-10-CM | POA: Diagnosis not present

## 2018-04-23 DIAGNOSIS — H26491 Other secondary cataract, right eye: Secondary | ICD-10-CM | POA: Diagnosis not present

## 2018-04-23 DIAGNOSIS — E119 Type 2 diabetes mellitus without complications: Secondary | ICD-10-CM | POA: Diagnosis not present

## 2018-04-23 DIAGNOSIS — H402213 Chronic angle-closure glaucoma, right eye, severe stage: Secondary | ICD-10-CM | POA: Diagnosis not present

## 2018-04-23 DIAGNOSIS — H402223 Chronic angle-closure glaucoma, left eye, severe stage: Secondary | ICD-10-CM | POA: Diagnosis not present

## 2018-04-23 LAB — HM DIABETES EYE EXAM

## 2018-04-24 ENCOUNTER — Telehealth (INDEPENDENT_AMBULATORY_CARE_PROVIDER_SITE_OTHER): Payer: Self-pay | Admitting: *Deleted

## 2018-04-24 NOTE — Telephone Encounter (Signed)
Pt called left vm with me wanting to know why I have not contacted [name] with [facility] on the order for her to have the injection. I called and left vm back with pt explaining in detail I am waiting on clinicals to get clarification on what kind of injection is needed. I apoliged for not getting back with her but explained have been waiting on James to get back in office to get clarification but once he gets in I will talk with him and see exactly what is needed.

## 2018-05-08 ENCOUNTER — Encounter: Payer: Self-pay | Admitting: Gynecology

## 2018-05-08 ENCOUNTER — Ambulatory Visit (HOSPITAL_COMMUNITY): Payer: Medicare Other

## 2018-05-08 ENCOUNTER — Ambulatory Visit (INDEPENDENT_AMBULATORY_CARE_PROVIDER_SITE_OTHER): Payer: Medicare Other | Admitting: Gynecology

## 2018-05-08 ENCOUNTER — Ambulatory Visit: Payer: Medicare Other | Admitting: Family

## 2018-05-08 VITALS — BP 124/82

## 2018-05-08 DIAGNOSIS — I6523 Occlusion and stenosis of bilateral carotid arteries: Secondary | ICD-10-CM | POA: Diagnosis not present

## 2018-05-08 DIAGNOSIS — N898 Other specified noninflammatory disorders of vagina: Secondary | ICD-10-CM | POA: Diagnosis not present

## 2018-05-08 LAB — WET PREP FOR TRICH, YEAST, CLUE

## 2018-05-08 MED ORDER — TERCONAZOLE 0.4 % VA CREA: 1.0000 | TOPICAL_CREAM | Freq: Every day | VAGINAL | 0 refills | Status: DC

## 2018-05-08 MED ORDER — CLOBETASOL PROPIONATE 0.05 % EX CREA: TOPICAL_CREAM | CUTANEOUS | 1 refills | Status: DC

## 2018-05-08 NOTE — Patient Instructions (Signed)
Use the Terazol vaginal cream inside the vagina at bedtime for 7 nights.  Use the clobetasol steroid cream on the outside of the vagina nightly for several weeks and then taper off.  Follow up if symptoms persist or return.

## 2018-05-08 NOTE — Progress Notes (Signed)
    [name] 30-Apr-1946 [id number]        72 y.o.  G2P1011 presents with continued vaginal itching.  Has been going on since May.  Mild discharge noted.  No odor.  No urinary symptoms such as frequency dysuria urgency.  Has been treated both for bacterial vaginosis and with antifungals in the past.  Has not noticed a dramatic relief of her itching.  Past medical history,surgical history, problem list, medications, allergies, family history and social history were all reviewed and documented in the EPIC chart.  Directed ROS with pertinent positives and negatives documented in the history of present illness/assessment and plan.  Exam: [name] assistant Vitals:   05/08/18 1433  BP: 124/82   General appearance:  Normal Abdomen soft nontender without masses guarding rebound Pelvic external BUS vagina with mild irritative changes over both labia majora.  No specific lesions.  No discolorations to suggest lichen sclerosus or pigmented changes.  Vagina with scant discharge.  Bimanual without masses or tenderness.  Assessment/Plan:  72 y.o. G2P1011 with persistent vulvar pruritus.  Exam without areas of concern such as lesions, pigmented changes, lichen sclerosis type changes.  Has a mild inflammatory appearance of both labia from scratching.  Wet prep is positive for yeast.  Will treat with Terazol 7 day cream vaginally.  Clobetasol 0.05% cream externally nightly for several weeks and then taper.  Follow-up if symptoms persist, worsen or recur.  [name], 2:48 PM 05/08/2018

## 2018-05-09 ENCOUNTER — Encounter: Payer: Self-pay | Admitting: Family

## 2018-05-15 ENCOUNTER — Ambulatory Visit
Admission: RE | Admit: 2018-05-15 | Discharge: 2018-05-15 | Disposition: A | Discharge status: Home / Self Care | Payer: Medicare Other | Source: Ambulatory Visit | Attending: Surgery | Admitting: Surgery

## 2018-05-15 DIAGNOSIS — M533 Sacrococcygeal disorders, not elsewhere classified: Secondary | ICD-10-CM | POA: Diagnosis not present

## 2018-05-15 DIAGNOSIS — G8929 Other chronic pain: Secondary | ICD-10-CM

## 2018-05-15 MED ORDER — METHYLPREDNISOLONE ACETATE 40 MG/ML INJ SUSP (RADIOLOG
120.0000 mg | Freq: Once | INTRAMUSCULAR | Status: AC
  Administered 2018-05-15: 120 mg via INTRA_ARTICULAR

## 2018-05-15 MED ORDER — IOPAMIDOL (ISOVUE-M 200) INJECTION 41%
1.0000 mL | Freq: Once | INTRAMUSCULAR | Status: AC
  Administered 2018-05-15: 1 mL via INTRA_ARTICULAR

## 2018-05-15 NOTE — Discharge Instructions (Signed)

## 2018-05-20 NOTE — Progress Notes (Signed)
Office Visit Note   Patient: [name]           Date of Birth: 04/23/1946           MRN: [id number] Visit Date: 04/17/2018              Requested by: Laurey Morale, MD [place], [place] [address] PCP: Laurey Morale, MD   Assessment & Plan: Visit Diagnoses:  1. Chronic right SI joint pain   2. Chronic left SI joint pain     Plan: At this point we will schedule patient for left SI joint diagnostic/therapeutic Marcaine/Depo-Medrol injection and right SI joint radiofrequency ablation.  Patient requested to have this done by Dr. Nelia Shi radiologist.  Follow-up with me in about 7 weeks for recheck to see how she is feeling.  Follow-Up Instructions: Return in about 7 weeks (around 06/05/2018) for with Nahia Nissan.   Orders:  Orders Placed This Encounter  Procedures  . DG Fluoro Guide Spinal/SI Jt Inj Left  . DG Fluoro Guide Spinal/SI Jt Inj Right   No orders of the defined types were placed in this encounter.     Procedures: No procedures performed   Clinical Data: No additional findings.   Subjective: Chief Complaint  Patient presents with  . Right/Left SI Joint F/U    HPI Patient states that right SI joint is doing somewhat better after previous Marcaine/Depo-Medrol SI joint injection.  We discussed possibly doing RF ablation.  Patient states that she has been having some discomfort in the left SI joint when she is walking and sitting.  Some occasional low back pain but not describing any lumbar radicular symptoms. Review of Systems No current cardiac pulmonary GI GU issues  Objective: Vital Signs: BP (!) 143/65   Pulse (!) 56   Ht 5' 3.5" (1.613 m)   Wt 190 lb (86.2 kg)   BMI 33.13 kg/m   Physical Exam  Constitutional: She is oriented to person, place, and time. She appears well-nourished. No distress.  HENT:  Head: Normocephalic and atraumatic.  Eyes: Pupils are equal, round, and reactive to light. EOM are normal.  Pulmonary/Chest: No  respiratory distress.  Musculoskeletal: She exhibits tenderness (Bilateral SI joints).  Negative logroll bilateral hips.  Negative straight leg raise.  Neurovascular intact.  Neurological: She is alert and oriented to person, place, and time.    Ortho Exam  Specialty Comments:  No specialty comments available.  Imaging: No results found.   PMFS History: Patient Active Problem List   Diagnosis Date Noted  . Hyperlipidemia 07/17/2017  . History of lumbar spinal fusion   . History of fusion of lumbar spine   . Anemia due to blood loss 11/02/2016    Class: Acute  . Lethargy 10/31/2016  . AKI (acute kidney injury) ([place]) 10/31/2016  . Chronic diastolic CHF (congestive heart failure) ([place]) 10/31/2016  . Cough   . Leukocytosis   . Spondylolisthesis, lumbar region 10/30/2016    Class: Chronic  . Spinal stenosis of lumbar region 10/30/2016    Class: Chronic  . Retained orthopedic hardware 10/30/2016    Class: Chronic  . Spinal stenosis of lumbar region with neurogenic claudication 10/30/2016  . Genetic testing 09/14/2015  . Atypical ductal hyperplasia of left breast 08/24/2015  . Family history of breast cancer   . Family history of prostate cancer   . Family history of stomach cancer   . Abnormal nuclear stress test 12/29/2014  . Cervical spondylosis with myelopathy  and radiculopathy 06/11/2014  . Carpal tunnel syndrome 05/14/2014  . Aftercare following surgery of the circulatory system, [place] 03/11/2014  . Pain of right lower extremity 03/11/2014  . Atypical lobular hyperplasia of left breast 02/12/2014  . Fibromyalgia 09/21/2013  . Carotid artery disease ([place]) 02/29/2012  . Back abscess 01/22/2012  . Chest pain with moderate risk of acute coronary syndrome 12/09/2009  . Shortness of breath 06/10/2009  . Orthostatic hypotension 01/31/2009  . CAD (coronary artery disease) 12/23/2008  . Normocytic anemia 07/21/2008  . Headache(784.0) 07/21/2008  . History of TIA (transient  ischemic attack) 07/21/2008  . Edema 02/27/2008  . Diabetes mellitus with complication ([place]) 07/62/2633  . Depression 07/16/2007  . Dyslipidemia 02/21/2007  . Essential hypertension 02/21/2007  . ASTHMA 02/21/2007  . GERD 02/21/2007  . LOW BACK PAIN 02/21/2007   Past Medical History:  Diagnosis Date  . Abnormal nuclear stress test 12/29/2014  . Allergy   . Anemia, unspecified   . Arthritis   . Blood transfusion without reported diagnosis    2018  . CAD (coronary artery disease)    sees. Dr. Burt Knack. s/p cath with PCI of RCA (xience des) June 2010. Pt also with LAD and D2 dzs-NL FFR in both areas med Rx  . Carotid artery occlusion   . Cataract    sees Dr. Herbert Deaner   . Depressive disorder, not elsewhere classified   . Duodenal nodule   . Edema   . Fibromyalgia   . Gastric polyps    COLON  . GERD (gastroesophageal reflux disease)   . Glaucoma    narrow angle, sees Dr. Herbert Deaner   . Headache(784.0)   . Hearing loss    left ear  . Heart murmur    questionable  . Hemorrhoids   . HSV (herpes simplex virus) anogenital infection 05/2014  . HTN (hypertension)   . Hyperlipidemia   . Low back pain   . Obesity   . Osteopenia 01/2018   T score -1.6 FRAX 7.1% / 1.4%  . Peripheral vascular disease ([place])   . Stroke [facility])    TIA  "many yrs ago"    . TIA (transient ischemic attack)    also Hx of it.   . Type II or unspecified type diabetes mellitus without mention of complication, not stated as uncontrolled    sees Dr. Carrolyn Meiers     Family History  Problem Relation Age of Onset  . Hypertension Mother   . Hypertension Sister   . Hyperlipidemia Sister   . Hypertension Brother   . Cancer Brother        PROSTATE/LUNG  . Diabetes Brother   . Heart disease Brother        Before age 41 - Bypass  . Varicose Veins Brother   . Cancer Father        Prostate and pancreatic   . Stomach cancer Maternal Grandmother 75  . Heart disease Maternal Grandfather   . Breast cancer Maternal Aunt  12  . Cancer Maternal Aunt        Stomach  . Breast cancer Cousin 13       maternal first cousin  . Prostate cancer Cousin 47  . Breast cancer Cousin 31  . Esophageal cancer Cousin   . Breast cancer Cousin 40       maternal first cousin's daughter  . Coronary artery disease Neg Hx        premature CAD  . Colon cancer Neg Hx   .  Colon polyps Neg Hx   . Rectal cancer Neg Hx     Past Surgical History:  Procedure Laterality Date  . ABDOMINAL HYSTERECTOMY  age 28   TAH and USO  Leiomyomata  . ANTERIOR CERVICAL CORPECTOMY N/A 06/11/2014   Procedure: ANTERIOR CERVICAL CORPECTOMY CERVICAL SIX; WITH CERVICAL FIVE TO CERVICAL SEVEN ARTHRODESIS;  Surgeon: Ashok Pall, MD;  Location: [place] [facility];  Service: Neurosurgery;  Laterality: N/A;  . BACK SURGERY  2008   lumb fusion  . BREAST BIOPSY Left 01/26/2014   Procedure: EXCISION LEFT BREAST MASS;  Surgeon: Adin Hector, MD;  Location: [place];  Service: General;  Laterality: Left;  . BREAST LUMPECTOMY WITH RADIOACTIVE SEED LOCALIZATION Left 07/24/2017   Procedure: LEFT BREAST LUMPECTOMY WITH RADIOACTIVE SEED LOCALIZATION ERAS;  Surgeon: Erroll Luna, MD;  Location: [place];  Service: General;  Laterality: Left;  . CARDIAC CATHETERIZATION    . CARDIAC CATHETERIZATION N/A 12/29/2014   Procedure: Left Heart Cath and Coronary Angiography;  Surgeon: Sherren Mocha, MD;  Location: [place] CV LAB;  Service: Cardiovascular;  Laterality: N/A;  . CAROTID ENDARTERECTOMY  march 2012   per Dr. Morton Amy  . CARPAL TUNNEL RELEASE Left 05/14/2014   Procedure: Left Carpal tunnel release;  Surgeon: Ashok Pall, MD;  Location: [place] [facility];  Service: Neurosurgery;  Laterality: Left;  Left Carpal tunnel release  . CARPAL TUNNEL RELEASE Bilateral   . CHOLECYSTECTOMY    . COLONOSCOPY  05-29-11   per Dr. Henrene Pastor, benign polyps, repeat in 5 yrs    . CORONARY STENT PLACEMENT     Dr. Burt Knack  . DILATION AND  CURETTAGE OF UTERUS    . ESOPHAGOGASTRODUODENOSCOPY  02-08-06   per Dr. Henrene Pastor, gastritis   . EYE SURGERY Left June 2016   Cataract  . GLAUCOMA SURGERY     with laser, per Dr. Henrene Pastor   . HARDWARE REMOVAL  10/30/2016   Procedure: HARDWARE REMOVAL;  Surgeon: Jessy Oto, MD;  Location: [place];  Service: Orthopedics;;  . LUMBAR FUSION  2008  . POLYPECTOMY    . POSTERIOR LUMBAR FUSION  10/30/2016   Removal of hardware rods and pedicle screws lumbar four, Extension of fusion L4-5 to L5-S1 with reinsertion of screws at L 4, left L5-S1 transforaminal lumbar interbody fusion, Exploration  left L5 nerve root, bilateral decompressive laminectomy L3-4/notes 10/30/2016  . ULNAR NERVE TRANSPOSITION Left 05/14/2014   Procedure: Left Ulnar nerve decompression;  Surgeon: Ashok Pall, MD;  Location: [place] [facility];  Service: Neurosurgery;  Laterality: Left;  Left Ulnar nerve decompression   Social History   Occupational History  . Not on file  Tobacco Use  . Smoking status: Former Smoker    Last attempt to quit: 01/22/1967    Years since quitting: 51.3  . Smokeless tobacco: Never Used  Substance and Sexual Activity  . Alcohol use: No    Alcohol/week: 0.0 standard drinks  . Drug use: No  . Sexual activity: Never    Birth control/protection: Surgical, Post-menopausal    Comment: HYST-1st intercourse 74 yo-5 partners

## 2018-06-04 ENCOUNTER — Ambulatory Visit (INDEPENDENT_AMBULATORY_CARE_PROVIDER_SITE_OTHER): Payer: Medicare Other | Admitting: Surgery

## 2018-06-04 DIAGNOSIS — L708 Other acne: Secondary | ICD-10-CM | POA: Diagnosis not present

## 2018-08-06 ENCOUNTER — Encounter (INDEPENDENT_AMBULATORY_CARE_PROVIDER_SITE_OTHER): Payer: Self-pay | Admitting: Surgery

## 2018-08-06 ENCOUNTER — Ambulatory Visit (INDEPENDENT_AMBULATORY_CARE_PROVIDER_SITE_OTHER): Payer: Medicare Other | Admitting: Surgery

## 2018-08-06 VITALS — Ht 63.5 in | Wt 192.0 lb

## 2018-08-06 DIAGNOSIS — M533 Sacrococcygeal disorders, not elsewhere classified: Secondary | ICD-10-CM

## 2018-08-06 DIAGNOSIS — G8929 Other chronic pain: Secondary | ICD-10-CM

## 2018-08-07 NOTE — Progress Notes (Signed)
Office Visit Note   Patient: [name]           Date of Birth: 1945/11/23           MRN: [id number] Visit Date: 08/06/2018              Requested by: Laurey Morale, MD [place], [place] [id number] PCP: Laurey Morale, MD   Assessment & Plan: Visit Diagnoses:  1. Chronic right SI joint pain   2. Chronic left SI joint pain   Status post L4-5 L5-S1 fusion  Plan: Patient follow-up in office in 3 months for recheck.  If right SI joint flares up she will proceed RF ablation.  She does have a current order for this.  Since she has had a good response with left SI joint diagnostic/therapeutic injection RF ablation may be a good option there as well in the future.  Patient appears to stated that she is not wanting any further surgeries on her back.  Follow-Up Instructions: Return in about 3 months (around 11/05/2018) for [place].   Orders:  No orders of the defined types were placed in this encounter.  No orders of the defined types were placed in this encounter.     Procedures: No procedures performed   Clinical Data: No additional findings.   Subjective: Chief Complaint  Patient presents with  . Spine - Follow-up    HPI 73 year old black female returns for recheck.  She was last seen by me April 17, 2018 and I sent her for left SI joint injection and right SI joint RF ablation.  She states that she only had the left SI joint injection.  This continues to give about 70 to 80% improvement of her pain there.  She does state that the right side is also doing reasonably well.  She is pleased at this point.  She does have some back pain but not as bad. Review of Systems No current cardiac pulmonary GI GU issues  Objective: Vital Signs: Ht 5' 3.5" (1.613 m)   Wt 192 lb (87.1 kg)   BMI 33.48 kg/m   Physical Exam HENT:     Head: Normocephalic.  Eyes:     Extraocular Movements: Extraocular movements intact.     Pupils: Pupils are equal, round, and  reactive to light.  Pulmonary:     Effort: Pulmonary effort is normal.  Musculoskeletal:     Comments: Patient does have some lumbar paraspinal tenderness.  Neurovas intact.  No focal motor deficits.  Skin:    General: Skin is warm and dry.  Neurological:     Mental Status: She is alert.  Psychiatric:        Mood and Affect: Mood normal.        Behavior: Behavior normal.     Ortho Exam  Specialty Comments:  No specialty comments available.  Imaging: No results found.   PMFS History: Patient Active Problem List   Diagnosis Date Noted  . Hyperlipidemia 07/17/2017  . History of lumbar spinal fusion   . History of fusion of lumbar spine   . Anemia due to blood loss 11/02/2016    Class: Acute  . Lethargy 10/31/2016  . AKI (acute kidney injury) ([place]) 10/31/2016  . Chronic diastolic CHF (congestive heart failure) ([place]) 10/31/2016  . Cough   . Leukocytosis   . Spondylolisthesis, lumbar region 10/30/2016    Class: Chronic  . Spinal stenosis of lumbar region 10/30/2016    Class: Chronic  .  Retained orthopedic hardware 10/30/2016    Class: Chronic  . Spinal stenosis of lumbar region with neurogenic claudication 10/30/2016  . Genetic testing 09/14/2015  . Atypical ductal hyperplasia of left breast 08/24/2015  . Family history of breast cancer   . Family history of prostate cancer   . Family history of stomach cancer   . Abnormal nuclear stress test 12/29/2014  . Cervical spondylosis with myelopathy and radiculopathy 06/11/2014  . Carpal tunnel syndrome 05/14/2014  . Aftercare following surgery of the circulatory system, [place] 03/11/2014  . Pain of right lower extremity 03/11/2014  . Atypical lobular hyperplasia of left breast 02/12/2014  . Fibromyalgia 09/21/2013  . Carotid artery disease ([place]) 02/29/2012  . Back abscess 01/22/2012  . Chest pain with moderate risk of acute coronary syndrome 12/09/2009  . Shortness of breath 06/10/2009  . Orthostatic hypotension  01/31/2009  . CAD (coronary artery disease) 12/23/2008  . Normocytic anemia 07/21/2008  . Headache(784.0) 07/21/2008  . History of TIA (transient ischemic attack) 07/21/2008  . Edema 02/27/2008  . Diabetes mellitus with complication ([facility]) 38/18/2993  . Depression 07/16/2007  . Dyslipidemia 02/21/2007  . Essential hypertension 02/21/2007  . ASTHMA 02/21/2007  . GERD 02/21/2007  . LOW BACK PAIN 02/21/2007   Past Medical History:  Diagnosis Date  . Abnormal nuclear stress test 12/29/2014  . Allergy   . Anemia, unspecified   . Arthritis   . Blood transfusion without reported diagnosis    2018  . CAD (coronary artery disease)    sees. Dr. Burt Knack. s/p cath with PCI of RCA (xience des) June 2010. Pt also with LAD and D2 dzs-NL FFR in both areas med Rx  . Carotid artery occlusion   . Cataract    sees Dr. Herbert Deaner   . Depressive disorder, not elsewhere classified   . Duodenal nodule   . Edema   . Fibromyalgia   . Gastric polyps    COLON  . GERD (gastroesophageal reflux disease)   . Glaucoma    narrow angle, sees Dr. Herbert Deaner   . Headache(784.0)   . Hearing loss    left ear  . Heart murmur    questionable  . Hemorrhoids   . HSV (herpes simplex virus) anogenital infection 05/2014  . HTN (hypertension)   . Hyperlipidemia   . Low back pain   . Obesity   . Osteopenia 01/2018   T score -1.6 FRAX 7.1% / 1.4%  . Peripheral vascular disease ([place])   . Stroke [facility])    TIA  "many yrs ago"    . TIA (transient ischemic attack)    also Hx of it.   . Type II or unspecified type diabetes mellitus without mention of complication, not stated as uncontrolled    sees Dr. Carrolyn Meiers     Family History  Problem Relation Age of Onset  . Hypertension Mother   . Hypertension Sister   . Hyperlipidemia Sister   . Hypertension Brother   . Cancer Brother        PROSTATE/LUNG  . Diabetes Brother   . Heart disease Brother        Before age 71 - Bypass  . Varicose Veins Brother   . Cancer  Father        Prostate and pancreatic   . Stomach cancer Maternal Grandmother 65  . Heart disease Maternal Grandfather   . Breast cancer Maternal Aunt 36  . Cancer Maternal Aunt        Stomach  . Breast  cancer Cousin 70       maternal first cousin  . Prostate cancer Cousin 58  . Breast cancer Cousin 75  . Esophageal cancer Cousin   . Breast cancer Cousin 40       maternal first cousin's daughter  . Coronary artery disease Neg Hx        premature CAD  . Colon cancer Neg Hx   . Colon polyps Neg Hx   . Rectal cancer Neg Hx     Past Surgical History:  Procedure Laterality Date  . ABDOMINAL HYSTERECTOMY  age 62   TAH and USO  Leiomyomata  . ANTERIOR CERVICAL CORPECTOMY N/A 06/11/2014   Procedure: ANTERIOR CERVICAL CORPECTOMY CERVICAL SIX; WITH CERVICAL FIVE TO CERVICAL SEVEN ARTHRODESIS;  Surgeon: Ashok Pall, MD;  Location: [place] NEURO ORS;  Service: Neurosurgery;  Laterality: N/A;  . BACK SURGERY  2008   lumb fusion  . BREAST BIOPSY Left 01/26/2014   Procedure: EXCISION LEFT BREAST MASS;  Surgeon: Adin Hector, MD;  Location: [facility];  Service: General;  Laterality: Left;  . BREAST LUMPECTOMY WITH RADIOACTIVE SEED LOCALIZATION Left 07/24/2017   Procedure: LEFT BREAST LUMPECTOMY WITH RADIOACTIVE SEED LOCALIZATION ERAS;  Surgeon: Erroll Luna, MD;  Location: [facility];  Service: General;  Laterality: Left;  . CARDIAC CATHETERIZATION    . CARDIAC CATHETERIZATION N/A 12/29/2014   Procedure: Left Heart Cath and Coronary Angiography;  Surgeon: Sherren Mocha, MD;  Location: [place] CV LAB;  Service: Cardiovascular;  Laterality: N/A;  . CAROTID ENDARTERECTOMY  march 2012   per Dr. Morton Amy  . CARPAL TUNNEL RELEASE Left 05/14/2014   Procedure: Left Carpal tunnel release;  Surgeon: Ashok Pall, MD;  Location: [place] NEURO ORS;  Service: Neurosurgery;  Laterality: Left;  Left Carpal tunnel release  . CARPAL TUNNEL RELEASE Bilateral   .  CHOLECYSTECTOMY    . COLONOSCOPY  05-29-11   per Dr. Henrene Pastor, benign polyps, repeat in 5 yrs    . CORONARY STENT PLACEMENT     Dr. Burt Knack  . DILATION AND CURETTAGE OF UTERUS    . ESOPHAGOGASTRODUODENOSCOPY  02-08-06   per Dr. Henrene Pastor, gastritis   . EYE SURGERY Left June 2016   Cataract  . GLAUCOMA SURGERY     with laser, per Dr. Henrene Pastor   . HARDWARE REMOVAL  10/30/2016   Procedure: HARDWARE REMOVAL;  Surgeon: Jessy Oto, MD;  Location: [facility];  Service: Orthopedics;;  . LUMBAR FUSION  2008  . POLYPECTOMY    . POSTERIOR LUMBAR FUSION  10/30/2016   Removal of hardware rods and pedicle screws lumbar four, Extension of fusion L4-5 to L5-S1 with reinsertion of screws at L 4, left L5-S1 transforaminal lumbar interbody fusion, Exploration  left L5 nerve root, bilateral decompressive laminectomy L3-4/notes 10/30/2016  . ULNAR NERVE TRANSPOSITION Left 05/14/2014   Procedure: Left Ulnar nerve decompression;  Surgeon: Ashok Pall, MD;  Location: [place] NEURO ORS;  Service: Neurosurgery;  Laterality: Left;  Left Ulnar nerve decompression   Social History   Occupational History  . Not on file  Tobacco Use  . Smoking status: Former Smoker    Last attempt to quit: 01/22/1967    Years since quitting: 51.5  . Smokeless tobacco: Never Used  Substance and Sexual Activity  . Alcohol use: No    Alcohol/week: 0.0 standard drinks  . Drug use: No  . Sexual activity: Never    Birth control/protection: Surgical, Post-menopausal    Comment: HYST-1st intercourse 19 yo-5  partners

## 2018-08-27 DIAGNOSIS — H9192 Unspecified hearing loss, left ear: Secondary | ICD-10-CM | POA: Diagnosis not present

## 2018-08-27 DIAGNOSIS — L299 Pruritus, unspecified: Secondary | ICD-10-CM | POA: Diagnosis not present

## 2018-08-27 DIAGNOSIS — H6121 Impacted cerumen, right ear: Secondary | ICD-10-CM | POA: Diagnosis not present

## 2018-08-27 DIAGNOSIS — H938X1 Other specified disorders of right ear: Secondary | ICD-10-CM | POA: Diagnosis not present

## 2018-09-17 ENCOUNTER — Other Ambulatory Visit: Payer: Self-pay | Admitting: Physician Assistant

## 2018-09-19 ENCOUNTER — Other Ambulatory Visit: Payer: Self-pay | Admitting: Family Medicine

## 2018-10-07 ENCOUNTER — Ambulatory Visit: Payer: Medicare Other | Admitting: Family

## 2018-10-07 ENCOUNTER — Encounter (HOSPITAL_COMMUNITY): Payer: Medicare Other

## 2018-10-18 ENCOUNTER — Other Ambulatory Visit: Payer: Self-pay | Admitting: Gynecology

## 2018-10-20 ENCOUNTER — Encounter: Payer: Self-pay | Admitting: *Deleted

## 2018-11-03 ENCOUNTER — Other Ambulatory Visit: Payer: Self-pay | Admitting: Gynecology

## 2018-11-06 ENCOUNTER — Ambulatory Visit (INDEPENDENT_AMBULATORY_CARE_PROVIDER_SITE_OTHER): Payer: Medicare Other | Admitting: Specialist

## 2018-11-12 ENCOUNTER — Other Ambulatory Visit: Payer: Self-pay | Admitting: Gynecology

## 2018-11-18 ENCOUNTER — Telehealth: Payer: Self-pay

## 2018-11-18 NOTE — Telephone Encounter (Signed)
Patient called complaining of vaginal itching. She said she saw you in Oct 2019 for same thing and you prescribed Terazol 7 cream and she would like to get that again. Doesn't want to have to come for visit since she said she knows what this is.

## 2018-11-19 MED ORDER — TERCONAZOLE 0.4 % VA CREA: 1.0000 | TOPICAL_CREAM | Freq: Every day | VAGINAL | 0 refills | Status: DC

## 2018-11-19 NOTE — Telephone Encounter (Signed)
Called patient and left message that Rx sent.

## 2018-11-19 NOTE — Telephone Encounter (Signed)
Okay for [employer] 7

## 2018-11-21 ENCOUNTER — Other Ambulatory Visit: Payer: Self-pay | Admitting: Family Medicine

## 2018-11-21 ENCOUNTER — Other Ambulatory Visit: Payer: Self-pay | Admitting: Cardiovascular Disease

## 2018-12-07 ENCOUNTER — Other Ambulatory Visit: Payer: Self-pay | Admitting: Physician Assistant

## 2018-12-26 DIAGNOSIS — E119 Type 2 diabetes mellitus without complications: Secondary | ICD-10-CM | POA: Diagnosis not present

## 2018-12-26 DIAGNOSIS — H1851 Endothelial corneal dystrophy: Secondary | ICD-10-CM | POA: Diagnosis not present

## 2018-12-26 DIAGNOSIS — H402233 Chronic angle-closure glaucoma, bilateral, severe stage: Secondary | ICD-10-CM | POA: Diagnosis not present

## 2018-12-26 DIAGNOSIS — H04123 Dry eye syndrome of bilateral lacrimal glands: Secondary | ICD-10-CM | POA: Diagnosis not present

## 2019-01-01 ENCOUNTER — Ambulatory Visit: Payer: Medicare Other | Admitting: Family

## 2019-01-01 ENCOUNTER — Encounter (HOSPITAL_COMMUNITY): Payer: Medicare Other

## 2019-01-02 ENCOUNTER — Ambulatory Visit (HOSPITAL_COMMUNITY)
Admission: RE | Admit: 2019-01-02 | Discharge: 2019-01-02 | Disposition: A | Discharge status: Home / Self Care | Payer: Medicare Other | Source: Ambulatory Visit | Attending: Vascular Surgery | Admitting: Vascular Surgery

## 2019-01-02 ENCOUNTER — Other Ambulatory Visit: Payer: Self-pay

## 2019-01-02 DIAGNOSIS — I6523 Occlusion and stenosis of bilateral carotid arteries: Secondary | ICD-10-CM | POA: Diagnosis not present

## 2019-01-02 DIAGNOSIS — Z9889 Other specified postprocedural states: Secondary | ICD-10-CM | POA: Diagnosis not present

## 2019-01-05 ENCOUNTER — Ambulatory Visit: Payer: Medicare Other | Admitting: Family

## 2019-01-14 ENCOUNTER — Telehealth: Payer: Self-pay | Admitting: Surgery

## 2019-01-14 DIAGNOSIS — H9042 Sensorineural hearing loss, unilateral, left ear, with unrestricted hearing on the contralateral side: Secondary | ICD-10-CM | POA: Diagnosis not present

## 2019-01-14 DIAGNOSIS — H6121 Impacted cerumen, right ear: Secondary | ICD-10-CM | POA: Diagnosis not present

## 2019-01-14 NOTE — Telephone Encounter (Signed)
Patient lmom requesting a call back from Benjiman Core regarding the back injection she was set up to have done.

## 2019-01-15 ENCOUNTER — Other Ambulatory Visit: Payer: Self-pay | Admitting: Physician Assistant

## 2019-01-15 DIAGNOSIS — I10 Essential (primary) hypertension: Secondary | ICD-10-CM

## 2019-01-15 NOTE — Telephone Encounter (Signed)
I called and lmom advising her to call back and let us know what injection she is meaning.  Looks like Jeneen Rinks saw her, and says he had ordered an SI joint injection and RFA, but there is no order in the chart.

## 2019-01-20 ENCOUNTER — Other Ambulatory Visit: Payer: Self-pay

## 2019-01-21 NOTE — Telephone Encounter (Signed)
Patient has appt with Jeneen Rinks on 01/22/2019

## 2019-01-22 ENCOUNTER — Other Ambulatory Visit: Payer: Self-pay

## 2019-01-22 ENCOUNTER — Ambulatory Visit: Payer: Self-pay

## 2019-01-22 ENCOUNTER — Encounter: Payer: Self-pay | Admitting: Gynecology

## 2019-01-22 ENCOUNTER — Encounter: Payer: Medicare Other | Admitting: Gynecology

## 2019-01-22 ENCOUNTER — Ambulatory Visit (INDEPENDENT_AMBULATORY_CARE_PROVIDER_SITE_OTHER): Payer: Medicare Other | Admitting: Surgery

## 2019-01-22 ENCOUNTER — Encounter: Payer: Self-pay | Admitting: Surgery

## 2019-01-22 ENCOUNTER — Ambulatory Visit (INDEPENDENT_AMBULATORY_CARE_PROVIDER_SITE_OTHER): Payer: Medicare Other | Admitting: Gynecology

## 2019-01-22 VITALS — BP 124/76 | Ht 62.5 in | Wt 184.0 lb

## 2019-01-22 VITALS — Ht 64.0 in | Wt 180.0 lb

## 2019-01-22 DIAGNOSIS — I6523 Occlusion and stenosis of bilateral carotid arteries: Secondary | ICD-10-CM | POA: Diagnosis not present

## 2019-01-22 DIAGNOSIS — Z9189 Other specified personal risk factors, not elsewhere classified: Secondary | ICD-10-CM | POA: Diagnosis not present

## 2019-01-22 DIAGNOSIS — G8929 Other chronic pain: Secondary | ICD-10-CM | POA: Diagnosis not present

## 2019-01-22 DIAGNOSIS — Z01419 Encounter for gynecological examination (general) (routine) without abnormal findings: Secondary | ICD-10-CM | POA: Diagnosis not present

## 2019-01-22 DIAGNOSIS — M8580 Other specified disorders of bone density and structure, unspecified site: Secondary | ICD-10-CM | POA: Diagnosis not present

## 2019-01-22 DIAGNOSIS — N907 Vulvar cyst: Secondary | ICD-10-CM | POA: Diagnosis not present

## 2019-01-22 DIAGNOSIS — N952 Postmenopausal atrophic vaginitis: Secondary | ICD-10-CM | POA: Diagnosis not present

## 2019-01-22 DIAGNOSIS — M533 Sacrococcygeal disorders, not elsewhere classified: Secondary | ICD-10-CM | POA: Diagnosis not present

## 2019-01-22 DIAGNOSIS — Z9289 Personal history of other medical treatment: Secondary | ICD-10-CM

## 2019-01-22 DIAGNOSIS — M4326 Fusion of spine, lumbar region: Secondary | ICD-10-CM | POA: Diagnosis not present

## 2019-01-22 MED ORDER — CLOBETASOL PROPIONATE 0.05 % EX CREA: TOPICAL_CREAM | CUTANEOUS | 1 refills | Status: DC

## 2019-01-22 NOTE — Progress Notes (Signed)
Office Visit Note   Patient: [name]           Date of Birth: March 13, 1946           MRN: [id number] Visit Date: 01/22/2019              Requested by: Laurey Morale, MD [place],  [place] [address] PCP: Laurey Morale, MD   Assessment & Plan: Visit Diagnoses:  1. Fusion of lumbar spine   2. Chronic left SI joint pain   3. Chronic right SI joint pain     Plan: Patient currently having pain at both SI joints.  Today she states that the left side is more symptomatic.  I asked Dr. Junius Roads in the clinic today to do an ultrasound-guided left SI joint [place]/Depo-Medrol injection and he agreed.  I will schedule patient for right SI joint RF ablation procedure with radiologist Dr. Fabiola Backer at her request.  I will have patient follow-up in about 6 weeks to see how she is doing after RF ablation procedure.  We may consider doing the left side as well in the future depending on her response.  Follow-Up Instructions: Return in about 6 weeks (around 03/05/2019) for Follow-up after SI joint RF ablation.   Orders:  Orders Placed This Encounter  Procedures  . XR Lumbar Spine 2-3 Views   No orders of the defined types were placed in this encounter.     Procedures: No procedures performed   Clinical Data: No additional findings.   Subjective: Chief Complaint  Patient presents with  . Lower Back - Follow-up    HPI 73 year old black female returns with complaints of bilateral SI joint pain.  Last office visit I discussed with patient that if right SI joint flared up that the next up would be to proceed with RF ablation.  She has not had this done as of yet.  States that both sides continue to be very bothersome.  Left side more painful today but says that the right may have been more painful yesterday.  Not complain of any radicular symptoms. Review of Systems No current cardiac pulmonary GI GU issues  Objective: Vital Signs: Ht 5\' 4"  (1.626 m)   Wt  180 lb (81.6 kg)   BMI 30.90 kg/m   Physical Exam HENT:     Head: Normocephalic.  Pulmonary:     Effort: No respiratory distress.  Musculoskeletal:     Comments: Gait is somewhat antalgic.  Patient is markedly tender over the bilateral SI joints.  Negative logroll bilateral hips.  Skin:    General: Skin is warm and dry.  Neurological:     General: No focal deficit present.     Mental Status: She is alert and oriented to person, place, and time.     Ortho Exam  Specialty Comments:  No specialty comments available.  Imaging: No results found.   PMFS History: Patient Active Problem List   Diagnosis Date Noted  . Hyperlipidemia 07/17/2017  . History of lumbar spinal fusion   . History of fusion of lumbar spine   . Anemia due to blood loss 11/02/2016    Class: Acute  . Lethargy 10/31/2016  . AKI (acute kidney injury) ([place]) 10/31/2016  . Chronic diastolic CHF (congestive heart failure) ([place]) 10/31/2016  . Cough   . Leukocytosis   . Spondylolisthesis, lumbar region 10/30/2016    Class: Chronic  . Spinal stenosis of lumbar region 10/30/2016    Class:  Chronic  . Retained orthopedic hardware 10/30/2016    Class: Chronic  . Spinal stenosis of lumbar region with neurogenic claudication 10/30/2016  . Genetic testing 09/14/2015  . Atypical ductal hyperplasia of left breast 08/24/2015  . Family history of breast cancer   . Family history of prostate cancer   . Family history of stomach cancer   . Abnormal nuclear stress test 12/29/2014  . Cervical spondylosis with myelopathy and radiculopathy 06/11/2014  . Carpal tunnel syndrome 05/14/2014  . Aftercare following surgery of the circulatory system, [place] 03/11/2014  . Pain of right lower extremity 03/11/2014  . Atypical lobular hyperplasia of left breast 02/12/2014  . Fibromyalgia 09/21/2013  . Carotid artery disease ([employer]) 02/29/2012  . Back abscess 01/22/2012  . Chest pain with moderate risk of acute coronary syndrome  12/09/2009  . Shortness of breath 06/10/2009  . Orthostatic hypotension 01/31/2009  . CAD (coronary artery disease) 12/23/2008  . Normocytic anemia 07/21/2008  . Headache(784.0) 07/21/2008  . History of TIA (transient ischemic attack) 07/21/2008  . Edema 02/27/2008  . Diabetes mellitus with complication ([place]) 73/53/2992  . Depression 07/16/2007  . Dyslipidemia 02/21/2007  . Essential hypertension 02/21/2007  . ASTHMA 02/21/2007  . GERD 02/21/2007  . LOW BACK PAIN 02/21/2007   Past Medical History:  Diagnosis Date  . Abnormal nuclear stress test 12/29/2014  . Allergy   . Anemia, unspecified   . Arthritis   . Blood transfusion without reported diagnosis    2018  . CAD (coronary artery disease)    sees. Dr. Burt Knack. s/p cath with PCI of RCA (xience des) June 2010. Pt also with LAD and D2 dzs-NL FFR in both areas med Rx  . Carotid artery occlusion   . Cataract    sees Dr. Herbert Deaner   . Depressive disorder, not elsewhere classified   . Duodenal nodule   . Edema   . Fibromyalgia   . Gastric polyps    COLON  . GERD (gastroesophageal reflux disease)   . Glaucoma    narrow angle, sees Dr. Herbert Deaner   . Headache(784.0)   . Hearing loss    left ear  . Heart murmur    questionable  . Hemorrhoids   . HSV (herpes simplex virus) anogenital infection 05/2014  . HTN (hypertension)   . Hyperlipidemia   . Low back pain   . Obesity   . Osteopenia 01/2018   T score -1.6 FRAX 7.1% / 1.4%  . Peripheral vascular disease ([place])   . Stroke [facility])    TIA  "many yrs ago"    . TIA (transient ischemic attack)    also Hx of it.   . Type II or unspecified type diabetes mellitus without mention of complication, not stated as uncontrolled    sees Dr. Carrolyn Meiers     Family History  Problem Relation Age of Onset  . Hypertension Mother   . Hypertension Sister   . Hyperlipidemia Sister   . Hypertension Brother   . Cancer Brother        PROSTATE/LUNG  . Diabetes Brother   . Heart disease Brother         Before age 65 - Bypass  . Varicose Veins Brother   . Cancer Father        Prostate and pancreatic   . Stomach cancer Maternal Grandmother 68  . Heart disease Maternal Grandfather   . Breast cancer Maternal Aunt 16  . Cancer Maternal Aunt        Stomach  .  Breast cancer Cousin 72       maternal first cousin  . Prostate cancer Cousin 67  . Breast cancer Cousin 70  . Esophageal cancer Cousin   . Breast cancer Cousin 40       maternal first cousin's daughter  . Coronary artery disease Neg Hx        premature CAD  . Colon cancer Neg Hx   . Colon polyps Neg Hx   . Rectal cancer Neg Hx     Past Surgical History:  Procedure Laterality Date  . ABDOMINAL HYSTERECTOMY  age 36   TAH and USO  Leiomyomata  . ANTERIOR CERVICAL CORPECTOMY N/A 06/11/2014   Procedure: ANTERIOR CERVICAL CORPECTOMY CERVICAL SIX; WITH CERVICAL FIVE TO CERVICAL SEVEN ARTHRODESIS;  Surgeon: Ashok Pall, MD;  Location: [place] NEURO ORS;  Service: Neurosurgery;  Laterality: N/A;  . BACK SURGERY  2008   lumb fusion  . BREAST BIOPSY Left 01/26/2014   Procedure: EXCISION LEFT BREAST MASS;  Surgeon: Adin Hector, MD;  Location: [place];  Service: General;  Laterality: Left;  . BREAST LUMPECTOMY WITH RADIOACTIVE SEED LOCALIZATION Left 07/24/2017   Procedure: LEFT BREAST LUMPECTOMY WITH RADIOACTIVE SEED LOCALIZATION ERAS;  Surgeon: Erroll Luna, MD;  Location: [place];  Service: General;  Laterality: Left;  . CARDIAC CATHETERIZATION    . CARDIAC CATHETERIZATION N/A 12/29/2014   Procedure: Left Heart Cath and Coronary Angiography;  Surgeon: Sherren Mocha, MD;  Location: [place] CV LAB;  Service: Cardiovascular;  Laterality: N/A;  . CAROTID ENDARTERECTOMY  march 2012   per Dr. Morton Amy  . CARPAL TUNNEL RELEASE Left 05/14/2014   Procedure: Left Carpal tunnel release;  Surgeon: Ashok Pall, MD;  Location: [place] NEURO ORS;  Service: Neurosurgery;  Laterality: Left;  Left  Carpal tunnel release  . CARPAL TUNNEL RELEASE Bilateral   . CHOLECYSTECTOMY    . COLONOSCOPY  05-29-11   per Dr. Henrene Pastor, benign polyps, repeat in 5 yrs    . CORONARY STENT PLACEMENT     Dr. Burt Knack  . DILATION AND CURETTAGE OF UTERUS    . ESOPHAGOGASTRODUODENOSCOPY  02-08-06   per Dr. Henrene Pastor, gastritis   . EYE SURGERY Left June 2016   Cataract  . GLAUCOMA SURGERY     with laser, per Dr. Henrene Pastor   . HARDWARE REMOVAL  10/30/2016   Procedure: HARDWARE REMOVAL;  Surgeon: Jessy Oto, MD;  Location: [facility];  Service: Orthopedics;;  . LUMBAR FUSION  2008  . POLYPECTOMY    . POSTERIOR LUMBAR FUSION  10/30/2016   Removal of hardware rods and pedicle screws lumbar four, Extension of fusion L4-5 to L5-S1 with reinsertion of screws at L 4, left L5-S1 transforaminal lumbar interbody fusion, Exploration  left L5 nerve root, bilateral decompressive laminectomy L3-4/notes 10/30/2016  . ULNAR NERVE TRANSPOSITION Left 05/14/2014   Procedure: Left Ulnar nerve decompression;  Surgeon: Ashok Pall, MD;  Location: [place] NEURO ORS;  Service: Neurosurgery;  Laterality: Left;  Left Ulnar nerve decompression   Social History   Occupational History  . Not on file  Tobacco Use  . Smoking status: Former Smoker    Quit date: 01/22/1967    Years since quitting: 52.0  . Smokeless tobacco: Never Used  Substance and Sexual Activity  . Alcohol use: No    Alcohol/week: 0.0 standard drinks  . Drug use: No  . Sexual activity: Never    Birth control/protection: Surgical, Post-menopausal    Comment: HYST-1st intercourse 64 yo-5 partners

## 2019-01-22 NOTE — Patient Instructions (Signed)
Follow-up if the vulvar sebaceous cysts cause problems.  Follow-up if the irritated area on the vulva continues to be a problem.  Follow-up in 1 year for annual exam

## 2019-01-22 NOTE — Progress Notes (Signed)
    [name] 1946-02-09 [id number]        73 y.o.  G2P1011 for breast and pelvic exam.  Noting some vulvar irritation.  Use clobetasol cream prescribed in October and noted she did well with this for irritation.  Also has noted to lumps.  Occasionally will swell and become uncomfortable but never drained.  Past medical history,surgical history, problem list, medications, allergies, family history and social history were all reviewed and documented as reviewed in the EPIC chart.  ROS:  Performed with pertinent positives and negatives included in the history, assessment and plan.   Additional significant findings : None   Exam: [name] assistant Vitals:   01/22/19 1414  BP: 124/76  Weight: 184 lb (83.5 kg)  Height: 5' 2.5" (1.588 m)   Body mass index is 33.12 kg/m.  General appearance:  Normal affect, orientation and appearance. Skin: Grossly normal HEENT: Without gross lesions.  No cervical or supraclavicular adenopathy. Thyroid normal.  Lungs:  Clear without wheezing, rales or rhonchi Cardiac: RR, without RMG Abdominal:  Soft, nontender, without masses, guarding, rebound, organomegaly or hernia Breasts:  Examined lying and sitting without masses, retractions, discharge or axillary adenopathy. Pelvic:  Ext, BUS, Vagina: With atrophic changes.  2 classic sebaceous cysts as diagrammed.  No overlying skin changes or inflammatory changes.  Small area of excoriation left of posterior fourchette at junction with labia majora.  No pigmentation changes or lesions.  Adnexa: Without masses or tenderness    Anus and perineum: Normal   Rectovaginal: Normal sphincter tone without palpated masses or tenderness.    Assessment/Plan:  73 y.o. G12P1011 female for breast and pelvic exam.  Status post TAH in the past  1. Area of vulvar excoriation.  No concerning changes.  Does not appear as HSV.  Will apply the clobetasol cream and I refilled her now.  She will represent if this area  persists. 2. Sebaceous cyst x2 of the vulva.  Classic in appearance.  Noninflammatory at this point.  Recommended warm soaks if they seem to flare.  No fluctuance to allow for I&D.  Do not feel at this point excision worthwhile and she agrees.  She will follow-up with a become an issue. 3. Postmenopausal.  No menopausal symptoms. 4. Osteopenia.  DEXA 01/2018 T score -1.6 FRAX 7% / 1.4%.  Plan repeat DEXA next year at 2-year interval. 5. Colonoscopy 2018.  Repeat at their recommended interval. 6. Pap smear 2011.  No Pap smear done today.  No history of abnormal Pap smears.  We both agree to stop screening per current screening guidelines based on age and hysterectomy history. 7. Mammography 04/2018.  Continue with annual mammography this coming October.  Breast exam normal today. 8. Health maintenance.  No routine lab work done as patient does this elsewhere.  Follow-up 1 year, sooner as needed.   [name], 3:15 PM 01/22/2019

## 2019-01-22 NOTE — Progress Notes (Signed)
Subjective: She is here for ultrasound-guided left sacroiliac injection.  She has had bilateral injections in the past with good results.  Objective: She is tender to palpation directly over the mid left SI joint.  Procedure: Ultrasound-guided left SI joint injection: After sterile prep with Betadine, injected 8 cc 1% lidocaine without epinephrine and 40 mg of methylprednisolone using a 22-gauge spinal needle, with ultrasound to guide needle placement into the region of the SI joint.  She had excellent pain relief during the immediate anesthetic phase.  She will follow-up as directed.

## 2019-01-23 ENCOUNTER — Other Ambulatory Visit: Payer: Self-pay | Admitting: Cardiovascular Disease

## 2019-02-05 NOTE — Addendum Note (Signed)
Addended by: Lanae Crumbly on: 02/05/2019 10:57 AM   Modules accepted: Orders

## 2019-02-10 ENCOUNTER — Inpatient Hospital Stay: Admission: RE | Admit: 2019-02-10 | Payer: Medicare Other | Source: Ambulatory Visit

## 2019-02-20 ENCOUNTER — Other Ambulatory Visit: Payer: Self-pay

## 2019-02-20 ENCOUNTER — Ambulatory Visit (INDEPENDENT_AMBULATORY_CARE_PROVIDER_SITE_OTHER): Payer: Medicare Other | Admitting: Family Medicine

## 2019-02-20 ENCOUNTER — Other Ambulatory Visit: Payer: Self-pay | Admitting: *Deleted

## 2019-02-20 ENCOUNTER — Encounter: Payer: Self-pay | Admitting: Family Medicine

## 2019-02-20 VITALS — BP 150/60 | HR 55 | Temp 98.7°F | Wt 185.8 lb

## 2019-02-20 DIAGNOSIS — I2510 Atherosclerotic heart disease of native coronary artery without angina pectoris: Secondary | ICD-10-CM | POA: Diagnosis not present

## 2019-02-20 DIAGNOSIS — K219 Gastro-esophageal reflux disease without esophagitis: Secondary | ICD-10-CM | POA: Diagnosis not present

## 2019-02-20 DIAGNOSIS — I6523 Occlusion and stenosis of bilateral carotid arteries: Secondary | ICD-10-CM

## 2019-02-20 DIAGNOSIS — E118 Type 2 diabetes mellitus with unspecified complications: Secondary | ICD-10-CM | POA: Diagnosis not present

## 2019-02-20 DIAGNOSIS — I10 Essential (primary) hypertension: Secondary | ICD-10-CM

## 2019-02-20 DIAGNOSIS — Z981 Arthrodesis status: Secondary | ICD-10-CM | POA: Diagnosis not present

## 2019-02-20 DIAGNOSIS — E785 Hyperlipidemia, unspecified: Secondary | ICD-10-CM | POA: Diagnosis not present

## 2019-02-20 DIAGNOSIS — Z20822 Contact with and (suspected) exposure to covid-19: Secondary | ICD-10-CM

## 2019-02-20 LAB — CBC WITH DIFFERENTIAL/PLATELET
Basophils Absolute: 0 10*3/uL (ref 0.0–0.1)
Basophils Relative: 0.4 % (ref 0.0–3.0)
Eosinophils Absolute: 0.1 10*3/uL (ref 0.0–0.7)
Eosinophils Relative: 1.5 % (ref 0.0–5.0)
HCT: 39.7 % (ref 36.0–46.0)
Hemoglobin: 13 g/dL (ref 12.0–15.0)
Lymphocytes Relative: 27.6 % (ref 12.0–46.0)
Lymphs Abs: 2.2 10*3/uL (ref 0.7–4.0)
MCHC: 32.6 g/dL (ref 30.0–36.0)
MCV: 86.2 fl (ref 78.0–100.0)
Monocytes Absolute: 0.7 10*3/uL (ref 0.1–1.0)
Monocytes Relative: 8.3 % (ref 3.0–12.0)
Neutro Abs: 5.1 10*3/uL (ref 1.4–7.7)
Neutrophils Relative %: 62.2 % (ref 43.0–77.0)
Platelets: 154 10*3/uL (ref 150.0–400.0)
RBC: 4.6 Mil/uL (ref 3.87–5.11)
RDW: 15.9 % — ABNORMAL HIGH (ref 11.5–15.5)
WBC: 8.2 10*3/uL (ref 4.0–10.5)

## 2019-02-20 LAB — LIPID PANEL
Cholesterol: 123 mg/dL (ref 0–200)
HDL: 39.4 mg/dL (ref >39.00)
LDL Cholesterol: 50 mg/dL (ref 0–99)
NonHDL: 83.41
Total CHOL/HDL Ratio: 3
Triglycerides: 168 mg/dL — ABNORMAL HIGH (ref 0.0–149.0)
VLDL: 33.6 mg/dL (ref 0.0–40.0)

## 2019-02-20 LAB — BASIC METABOLIC PANEL
BUN: 19 mg/dL (ref 6–23)
CO2: 29 mEq/L (ref 19–32)
Calcium: 9.3 mg/dL (ref 8.4–10.5)
Chloride: 102 mEq/L (ref 96–112)
Creatinine, Ser: 0.97 mg/dL (ref 0.40–1.20)
GFR: 68.46 mL/min (ref >60.00)
Glucose, Bld: 335 mg/dL — ABNORMAL HIGH (ref 70–99)
Potassium: 3.9 mEq/L (ref 3.5–5.1)
Sodium: 139 mEq/L (ref 135–145)

## 2019-02-20 LAB — HEPATIC FUNCTION PANEL
ALT: 27 U/L (ref 0–35)
AST: 20 U/L (ref 0–37)
Albumin: 4.1 g/dL (ref 3.5–5.2)
Alkaline Phosphatase: 91 U/L (ref 39–117)
Bilirubin, Direct: 0.1 mg/dL (ref 0.0–0.3)
Total Bilirubin: 0.7 mg/dL (ref 0.2–1.2)
Total Protein: 6.2 g/dL (ref 6.0–8.3)

## 2019-02-20 LAB — HEMOGLOBIN A1C: Hgb A1c MFr Bld: 15.2 % — ABNORMAL HIGH (ref 4.6–6.5)

## 2019-02-20 LAB — TSH: TSH: 2.6 u[IU]/mL (ref 0.35–4.50)

## 2019-02-20 NOTE — Progress Notes (Signed)
Subjective:    Patient ID: [name], female    DOB: [birth year], 73 y.o.   MRN: [id number]  HPI Here to follow up on issues. She is doing well except for chronic low back pain. She had an epidural steroid injection to the left lower spine last November, and now she is waiting to be scheduled for a right sided injection. She sees Dr. Forde Dandy for her diabetes. She gets eye exams 2-3 times a year. She is still driving. She sees Dr. Burt Knack for cardiology care.    Review of Systems  Constitutional: Negative.   HENT: Negative.   Eyes: Negative.   Respiratory: Negative.   Cardiovascular: Negative.   Gastrointestinal: Negative.   Genitourinary: Negative for decreased urine volume, difficulty urinating, dyspareunia, dysuria, enuresis, flank pain, frequency, hematuria, pelvic pain and urgency.  Musculoskeletal: Positive for back pain.  Skin: Negative.   Neurological: Negative.   Psychiatric/Behavioral: Negative.        Objective:   Physical Exam Constitutional:      General: She is not in acute distress.    Appearance: She is well-developed. She is obese.     Comments: Walks with a cane   HENT:     Head: Normocephalic and atraumatic.     Right Ear: External ear normal.     Left Ear: External ear normal.     Nose: Nose normal.     Mouth/Throat:     Pharynx: No oropharyngeal exudate.  Eyes:     General: No scleral icterus.    Conjunctiva/sclera: Conjunctivae normal.     Pupils: Pupils are equal, round, and reactive to light.  Neck:     Musculoskeletal: Normal range of motion and neck supple.     Thyroid: No thyromegaly.     Vascular: No JVD.  Cardiovascular:     Rate and Rhythm: Normal rate and regular rhythm.     Heart sounds: No friction rub. No gallop.      Comments: Has a soft 2/6 SM  Pulmonary:     Effort: Pulmonary effort is normal. No respiratory distress.     Breath sounds: Normal breath sounds. No wheezing or rales.  Chest:     Chest wall: No tenderness.   Abdominal:     General: Bowel sounds are normal. There is no distension.     Palpations: Abdomen is soft. There is no mass.     Tenderness: There is no abdominal tenderness. There is no guarding or rebound.  Musculoskeletal: Normal range of motion.        General: No tenderness.  Lymphadenopathy:     Cervical: No cervical adenopathy.  Skin:    General: Skin is warm and dry.     Findings: No erythema or rash.  Neurological:     Mental Status: She is alert and oriented to person, place, and time.     Cranial Nerves: No cranial nerve deficit.     Motor: No abnormal muscle tone.     Coordination: Coordination normal.     Deep Tendon Reflexes: Reflexes are normal and symmetric. Reflexes normal.  Psychiatric:        Behavior: Behavior normal.        Thought Content: Thought content normal.        Judgment: Judgment normal.           Assessment & Plan:  Her HTN is stable, she will follow up with Dr. Burt Knack. She will follow ip with Dr. Otho Ket office for low back  pain. We will get fasting labs today for lipids, etc.  Alysia Penna, MD

## 2019-02-22 LAB — NOVEL CORONAVIRUS, NAA: SARS-CoV-2, NAA: NOT DETECTED

## 2019-02-25 ENCOUNTER — Encounter: Payer: Self-pay | Admitting: Physical Medicine and Rehabilitation

## 2019-02-25 ENCOUNTER — Telehealth: Payer: Self-pay | Admitting: Physical Medicine and Rehabilitation

## 2019-02-25 ENCOUNTER — Other Ambulatory Visit: Payer: Self-pay

## 2019-02-25 ENCOUNTER — Ambulatory Visit (INDEPENDENT_AMBULATORY_CARE_PROVIDER_SITE_OTHER): Payer: Medicare Other | Admitting: Physical Medicine and Rehabilitation

## 2019-02-25 VITALS — BP 135/44 | HR 66

## 2019-02-25 DIAGNOSIS — G894 Chronic pain syndrome: Secondary | ICD-10-CM

## 2019-02-25 DIAGNOSIS — M461 Sacroiliitis, not elsewhere classified: Secondary | ICD-10-CM

## 2019-02-25 DIAGNOSIS — I6523 Occlusion and stenosis of bilateral carotid arteries: Secondary | ICD-10-CM

## 2019-02-25 DIAGNOSIS — M961 Postlaminectomy syndrome, not elsewhere classified: Secondary | ICD-10-CM

## 2019-02-25 NOTE — Telephone Encounter (Signed)
No PA is needed for Medicare/medicaid patients.

## 2019-02-25 NOTE — Progress Notes (Signed)
  Numeric Pain Rating Scale and Functional Assessment Average Pain 7 Pain Right Now 6 My pain is constant and aching Pain is worse with: walking Pain improves with: rest   In the last MONTH (on 0-10 scale) has pain interfered with the following?  1. General activity like being  able to carry out your everyday physical activities such as walking, climbing stairs, carrying groceries, or moving a chair?  Rating(10)  2. Relation with others like being able to carry out your usual social activities and roles such as  activities at home, at work and in your community. Rating(10)  3. Enjoyment of life such that you have  been bothered by emotional problems such as feeling anxious, depressed or irritable?  Rating(4)

## 2019-02-26 ENCOUNTER — Encounter: Payer: Self-pay | Admitting: Physical Medicine and Rehabilitation

## 2019-02-26 NOTE — Progress Notes (Signed)
[name] - 73 y.o. female MRN [id number]  Date of birth: [birth year]  Office Visit Note: Visit Date: 02/25/2019 PCP: Laurey Morale, MD Referred by: Laurey Morale, MD  Subjective: Chief Complaint  Patient presents with   Lower Back - Pain   Right Hip - Pain   Left Hip - Pain   HPI: [name] is a 73 y.o. female who comes in today At the request of Benjiman Core, PA-C and Dr. Basil Dess for evaluation management and potential for radiofrequency ablation of the bilateral sacroiliac joints.  By way of review I have not seen the patient before that she has had several injections at [facility].  This includes transforaminal type injections above prior surgery and at level of stenosis prior to her surgery.  She is undergone lumbar fusion at L4-5 and L5-S1 and is fused to the sacrum.  Most recently she has had intra-articular sacroiliac joint injections performed by Dr. Nelia Shi.  The patient reports that these were very beneficial and probably gave her more than 60% pain relief for some time.  She reports being able to walk better once they were performed.  This is very consistent status post lumbar fusion to see sacroiliac joint dysfunction and side of pain generators.  Particularly true when fused to the sacrum.  She is not endorsing any radicular pain.  Pain in the the left more than right buttock region right at the level of the sacroiliac joint.  Worse with going from sit to stand or deeper squat position to stand.  Worse with standing.  Some pain overall despite lumbar surgery.  Her medical history is complicated by diabetes and heart disease and peripheral vascular disease.  She has had prior cervical ACDF as well.  We did review several pages of notes from Dr. Louanne Skye and most recent injections at [facility].  There has not been MRI completed post surgery.  Patient has had physical therapy with strengthening and exercises and tries to remain active.  She does ambulate  with a cane.  Review of Systems  Constitutional: Negative for chills, fever, malaise/fatigue and weight loss.  HENT: Negative for hearing loss and sinus pain.   Eyes: Negative for blurred vision, double vision and photophobia.  Respiratory: Negative for cough and shortness of breath.   Cardiovascular: Negative for chest pain, palpitations and leg swelling.  Gastrointestinal: Negative for abdominal pain, nausea and vomiting.  Genitourinary: Negative for flank pain.  Musculoskeletal: Positive for back pain. Negative for myalgias.  Skin: Negative for itching and rash.  Neurological: Negative for tremors, focal weakness and weakness.  Endo/Heme/Allergies: Negative.   Psychiatric/Behavioral: Negative for depression.  All other systems reviewed and are negative.  Otherwise per HPI.  Assessment & Plan: Visit Diagnoses: No diagnosis found.  Plan: Findings:  Chronic pain syndrome and postlaminectomy syndrome with now pain in both sacroiliac joints particularly left little bit more than right.  Classic Fortin finger sign over the sacroiliac joint on the left.  Exam consistent with pain with external rotation and Patrick's testing.  She has had 2 diagnostic intra-articular injections at [facility] imaging which gave her quite a bit of relief.  She has had prior lumbar fusion to the sacrum.  She has no radicular pain.  Options include continued intermittent intra-articular injections by [facility] imaging if they are lasting for several months.  I do think she is a good candidate to try diagnostic lateral branch injections at L5-S1 and S2 and utilizing pain diary to  show a more than 50% pain reduction for possible preauthorization approval for radiofrequency ablation for more definitive pain relief.  We did talk about how this would last for up to a year at times when it works.  We talked about the pitfalls of the sacroiliac joint denervation versus facet joint denervation and anatomical considerations.   She does want to try the diagnostic blocks to see if there is relief.  I am not sure if [facility] imaging is doing radiofrequency ablation.  Mr. Ricard Dillon may want to see if Dr. Nelia Shi does that type of procedure.  Lastly I would make a case for potential for prolotherapy performed around the sacroiliac joint.  This could be combined with physical therapy again with more manual treatment as well.  Down the road spinal cord stimulator trial should be considered.    Meds & Orders: No orders of the defined types were placed in this encounter.  No orders of the defined types were placed in this encounter.   Follow-up: Return for Lateral branch blocks of the sacroiliac joints bilaterally.   Procedures: No procedures performed  No notes on file   Clinical History: 12/27/2017 Lspine Xray: AP and lateral flexion and extension radiographs show the fusion of L4-5  and L5-S1 with  Residual spondylolisthesis at both segments the interbody fusions appear  solid. The  L3-4 level with DDD and grade one anterolisthesis. Mild bilateral SI  sclerosis,no narrowing. -- MRI LUMBAR SPINE WITHOUT AND WITH CONTRAST (prior to surgery 2018) TECHNIQUE:  Multiplanar and multiecho pulse sequences of the lumbar spine were obtained without and with intravenous contrast.  CONTRAST:  49mL MULTIHANCE GADOBENATE DIMEGLUMINE 529 MG/ML IV SOLN  COMPARISON:  MRI dated 10/18/2014 and 01/19/2014 and CT scan dated 08/02/2011  FINDINGS: Segmentation:  Normal.  Alignment:  Normal.  Vertebrae: Solid fusion at L4-5. Moderate degenerative changes of the vertebral endplates at L9-3 details text next  Conus medullaris: Extends to the L2 level and appears normal.  Paraspinal and other soft tissues: Multiple small lymph nodes in the para-aortic region, unchanged since 2013. Postsurgical changes posteriorly at L4-5.  Disc levels:  T12-L1 and L1-2:  Normal.  L2-3: Tiny disc bulge just to the left and right of  midline without neural impingement, unchanged.  L3-4: Disc space narrowing with a broad based endplate osteophytes and hypertrophy of the ligamentum flavum and facet joints creating severe spinal stenosis loop in the deep loop in the the loop pump Impella mottled appearance slightly molar severe on image number 17 of series 6 as compared to the prior study, image 15 of series 6.  L4-5: Solid interbody and posterior fusion with no residual impingement. No pathologic enhancement after contrast administration.  L5-S1: PE progressive small central subligamentous disc protrusion slightly asymmetric to the left without focal neural impingement. Slight right and severe left facet arthritis, unchanged.  IMPRESSION: 1. Progressive severe spinal stenosis at L4. 2. Slight progression of small central subligamentous disc protrusion at L5-S1 without neural impingement. 3. Stable severe left facet arthritis at L5-S1.   Electronically Signed   By: Lorriane Shire M.D.   On: 02/13/2016 14:28   She reports that she quit smoking about 52 years ago. She has never used smokeless tobacco.  Recent Labs    02/20/19 1022  HGBA1C 15.2*    Objective:  VS:  HT:     WT:    BMI:      BP:(!) 135/44   HR:66bpm   TEMP: ( )   RESP:  Physical  Exam Vitals signs and nursing note reviewed.  Constitutional:      General: She is not in acute distress.    Appearance: Normal appearance. She is well-developed.  HENT:     Head: Normocephalic and atraumatic.     Nose: Nose normal.     Mouth/Throat:     Mouth: Mucous membranes are moist.     Pharynx: Oropharynx is clear.  Eyes:     Conjunctiva/sclera: Conjunctivae normal.     Pupils: Pupils are equal, round, and reactive to light.  Neck:     Musculoskeletal: Normal range of motion and neck supple.  Cardiovascular:     Rate and Rhythm: Regular rhythm.  Pulmonary:     Effort: Pulmonary effort is normal. No respiratory distress.  Abdominal:      General: There is no distension.     Palpations: Abdomen is soft.     Tenderness: There is no guarding.  Musculoskeletal:     Right lower leg: No edema.     Left lower leg: No edema.     Comments: Patient ambulates with a cane with forward flexed lumbar spine.  She is stiff throughout the lumbar spine with extension.  Mild pain with facet loading.  She has positive Fortin finger sign over the left equivocal of the right.  She has pain on both sides with external rotation.  She has no pain with internal rotation.  She has good distal strength without clonus.  Skin:    General: Skin is warm and dry.     Findings: No erythema or rash.  Neurological:     General: No focal deficit present.     Mental Status: She is alert and oriented to person, place, and time.     Motor: No abnormal muscle tone.     Coordination: Coordination normal.     Gait: Gait normal.  Psychiatric:        Mood and Affect: Mood normal.        Behavior: Behavior normal.        Thought Content: Thought content normal.     Ortho Exam Imaging: No results found.  Past Medical/Family/Surgical/Social History: Medications & Allergies reviewed per EMR, new medications updated. Patient Active Problem List   Diagnosis Date Noted   Hyperlipidemia 07/17/2017   History of lumbar spinal fusion    History of fusion of lumbar spine    Anemia due to blood loss 11/02/2016    Class: Acute   Lethargy 10/31/2016   AKI (acute kidney injury) ([place]) 10/31/2016   Chronic diastolic CHF (congestive heart failure) ([place]) 10/31/2016   Cough    Leukocytosis    Spondylolisthesis, lumbar region 10/30/2016    Class: Chronic   Spinal stenosis of lumbar region 10/30/2016    Class: Chronic   Retained orthopedic hardware 10/30/2016    Class: Chronic   Spinal stenosis of lumbar region with neurogenic claudication 10/30/2016   Genetic testing 09/14/2015   Atypical ductal hyperplasia of left breast 08/24/2015   Family history  of breast cancer    Family history of prostate cancer    Family history of stomach cancer    Abnormal nuclear stress test 12/29/2014   Cervical spondylosis with myelopathy and radiculopathy 06/11/2014   Carpal tunnel syndrome 05/14/2014   Aftercare following surgery of the circulatory system, NEC 03/11/2014   Pain of right lower extremity 03/11/2014   Atypical lobular hyperplasia of left breast 02/12/2014   Fibromyalgia 09/21/2013   Carotid artery disease ([place]) 02/29/2012  Back abscess 01/22/2012   Chest pain with moderate risk of acute coronary syndrome 12/09/2009   Shortness of breath 06/10/2009   Orthostatic hypotension 01/31/2009   CAD (coronary artery disease) 12/23/2008   Normocytic anemia 07/21/2008   Headache(784.0) 07/21/2008   History of TIA (transient ischemic attack) 07/21/2008   Edema 02/27/2008   Diabetes mellitus with complication ([place]) 07/37/1062   Depression 07/16/2007   Dyslipidemia 02/21/2007   Essential hypertension 02/21/2007   ASTHMA 02/21/2007   GERD 02/21/2007   LOW BACK PAIN 02/21/2007   Past Medical History:  Diagnosis Date   Abnormal nuclear stress test 12/29/2014   Allergy    Anemia, unspecified    Arthritis    Blood transfusion without reported diagnosis    2018   CAD (coronary artery disease)    sees. Dr. Burt Knack. s/p cath with PCI of RCA (xience des) June 2010. Pt also with LAD and D2 dzs-NL FFR in both areas med Rx   Carotid artery occlusion    Cataract    sees Dr. Herbert Deaner    Depressive disorder, not elsewhere classified    Duodenal nodule    Edema    Fibromyalgia    Gastric polyps    COLON   GERD (gastroesophageal reflux disease)    Glaucoma    narrow angle, sees Dr. Shelly Flatten)    Hearing loss    left ear   Heart murmur    questionable   Hemorrhoids    HSV (herpes simplex virus) anogenital infection 05/2014   HTN (hypertension)    Hyperlipidemia    Low back pain      she sees Dr. Louanne Skye, also Dr. Ernestina Patches for pain medications and Benjiman Core PA for injections    Obesity    Osteopenia 01/2018   T score -1.6 FRAX 7.1% / 1.4%   Peripheral vascular disease ([place])    Stroke ([place])    TIA  "many yrs ago"     TIA (transient ischemic attack)    also Hx of it.    Type II or unspecified type diabetes mellitus without mention of complication, not stated as uncontrolled    sees Dr. Carrolyn Meiers    Family History  Problem Relation Age of Onset   Hypertension Mother    Hypertension Sister    Hyperlipidemia Sister    Hypertension Brother    Cancer Brother        PROSTATE/LUNG   Diabetes Brother    Heart disease Brother        Before age 54 - Bypass   Varicose Veins Brother    Cancer Father        Prostate and pancreatic    Stomach cancer Maternal Grandmother 65   Heart disease Maternal Grandfather    Breast cancer Maternal Aunt 55   Cancer Maternal Aunt        Stomach   Breast cancer Cousin 29       maternal first cousin   Prostate cancer Cousin 17   Breast cancer Cousin 60   Esophageal cancer Cousin    Breast cancer Cousin 33       maternal first cousin's daughter   Coronary artery disease Neg Hx        premature CAD   Colon cancer Neg Hx    Colon polyps Neg Hx    Rectal cancer Neg Hx    Past Surgical History:  Procedure Laterality Date   ABDOMINAL HYSTERECTOMY  age 23   TAH and  USO  Leiomyomata   ANTERIOR CERVICAL CORPECTOMY N/A 06/11/2014   Procedure: ANTERIOR CERVICAL CORPECTOMY CERVICAL SIX; WITH CERVICAL FIVE TO CERVICAL SEVEN ARTHRODESIS;  Surgeon: Ashok Pall, MD;  Location: [place];  Service: Neurosurgery;  Laterality: N/A;   BACK SURGERY  2008   lumb fusion   BREAST BIOPSY Left 01/26/2014   Procedure: EXCISION LEFT BREAST MASS;  Surgeon: Adin Hector, MD;  Location: [facility];  Service: General;  Laterality: Left;   BREAST LUMPECTOMY WITH RADIOACTIVE SEED LOCALIZATION Left  07/24/2017   Procedure: LEFT BREAST LUMPECTOMY WITH RADIOACTIVE SEED LOCALIZATION ERAS;  Surgeon: Erroll Luna, MD;  Location: [facility];  Service: General;  Laterality: Left;   CARDIAC CATHETERIZATION     CARDIAC CATHETERIZATION N/A 12/29/2014   Procedure: Left Heart Cath and Coronary Angiography;  Surgeon: Sherren Mocha, MD;  Location: [facility];  Service: Cardiovascular;  Laterality: N/A;   CAROTID ENDARTERECTOMY  march 2012   per Dr. Morton Amy   CARPAL TUNNEL RELEASE Left 05/14/2014   Procedure: Left Carpal tunnel release;  Surgeon: Ashok Pall, MD;  Location: [place] [facility];  Service: Neurosurgery;  Laterality: Left;  Left Carpal tunnel release   CARPAL TUNNEL RELEASE Bilateral    CHOLECYSTECTOMY     COLONOSCOPY  06/26/2017   per Dr. Henrene Pastor, adenomatous polyp, repeat in 5 yrs     CORONARY STENT PLACEMENT     Dr. Burt Knack   DILATION AND CURETTAGE OF UTERUS     ESOPHAGOGASTRODUODENOSCOPY  02-08-06   per Dr. Henrene Pastor, gastritis    EYE SURGERY Left June 2016   Cataract   GLAUCOMA SURGERY     with laser, per Dr. Henrene Pastor    HARDWARE REMOVAL  10/30/2016   Procedure: HARDWARE REMOVAL;  Surgeon: Jessy Oto, MD;  Location: [facility];  Service: Orthopedics;;   LUMBAR FUSION  2008   POLYPECTOMY     POSTERIOR LUMBAR FUSION  10/30/2016   Removal of hardware rods and pedicle screws lumbar four, Extension of fusion L4-5 to L5-S1 with reinsertion of screws at L 4, left L5-S1 transforaminal lumbar interbody fusion, Exploration  left L5 nerve root, bilateral decompressive laminectomy L3-4/notes 10/30/2016   ULNAR NERVE TRANSPOSITION Left 05/14/2014   Procedure: Left Ulnar nerve decompression;  Surgeon: Ashok Pall, MD;  Location: [facility];  Service: Neurosurgery;  Laterality: Left;  Left Ulnar nerve decompression   Social History   Occupational History   Not on file  Tobacco Use   Smoking status: Former Smoker    Quit date: 01/22/1967    Years since  quitting: 52.1   Smokeless tobacco: Never Used  Substance and Sexual Activity   Alcohol use: No    Alcohol/week: 0.0 standard drinks   Drug use: No   Sexual activity: Never    Birth control/protection: Surgical, Post-menopausal    Comment: HYST-1st intercourse 62 yo-5 partners

## 2019-02-27 ENCOUNTER — Other Ambulatory Visit: Payer: Self-pay | Admitting: Physician Assistant

## 2019-03-05 ENCOUNTER — Ambulatory Visit: Payer: Medicare Other | Admitting: Surgery

## 2019-03-05 ENCOUNTER — Ambulatory Visit: Payer: Self-pay

## 2019-03-05 ENCOUNTER — Ambulatory Visit (INDEPENDENT_AMBULATORY_CARE_PROVIDER_SITE_OTHER): Payer: Medicare Other | Admitting: Physical Medicine and Rehabilitation

## 2019-03-05 VITALS — BP 175/62 | HR 63

## 2019-03-05 DIAGNOSIS — M461 Sacroiliitis, not elsewhere classified: Secondary | ICD-10-CM

## 2019-03-05 MED ORDER — BUPIVACAINE HCL 0.5 % IJ SOLN
3.0000 mL | Freq: Once | INTRAMUSCULAR | Status: AC
  Administered 2019-03-05: 3 mL

## 2019-03-05 MED ORDER — METHYLPREDNISOLONE ACETATE 80 MG/ML IJ SUSP
80.0000 mg | Freq: Once | INTRAMUSCULAR | Status: AC
  Administered 2019-03-05: 80 mg

## 2019-03-05 NOTE — Progress Notes (Signed)
.  Numeric Pain Rating Scale and Functional Assessment Average Pain 6   In the last MONTH (on 0-10 scale) has pain interfered with the following?  1. General activity like being  able to carry out your everyday physical activities such as walking, climbing stairs, carrying groceries, or moving a chair?  Rating(5)   +Driver, -BT, -Dye Allergies.  

## 2019-03-10 NOTE — Progress Notes (Signed)
[name] - 73 y.o. female MRN [id number]  Date of birth: 04-16-1946  Office Visit Note: Visit Date: 03/05/2019 PCP: Laurey Morale, MD Referred by: Laurey Morale, MD  Subjective: Chief Complaint  Patient presents with  . Lower Back - Pain   HPI:  [name] is a 72 y.o. female who comes in today For planned diagnostic sacroiliac joint nerve blocks of the L5 lateral branches as well as S1 and S2.  Please see our prior evaluation and management note for further details and justification.  ROS Otherwise per HPI.  Assessment & Plan: Visit Diagnoses:  1. Sacroiliitis ([place])     Plan: No additional findings.   Meds & Orders:  Meds ordered this encounter  Medications  . bupivacaine (MARCAINE) 0.5 % (with pres) injection 3 mL  . methylPREDNISolone acetate (DEPO-MEDROL) injection 80 mg    Orders Placed This Encounter  Procedures  . Nerve Block  . XR C-ARM NO REPORT    Follow-up: Return if symptoms worsen or fail to improve, for Review Pain Diary.   Procedures: No procedures performed  Sacroiliac Diagnostic Lateral Branch Nerve Block with Fluoroscopic Guidance   Patient: [name]      Date of Birth: 1946-06-27 MRN: [id number] PCP: Laurey Morale, MD      Visit Date: 03/05/2019   Universal Protocol:    Date/Time: 03/10/2011:29 PM  Consent Given By: the patient  Position: PRONE  Additional Comments: Vital signs were monitored before and after the procedure. Patient was prepped and draped in the usual sterile fashion. The correct patient, procedure, and site was verified.   Injection Procedure Details:  Procedure Site One Meds Administered:  Meds ordered this encounter  Medications  . bupivacaine (MARCAINE) 0.5 % (with pres) injection 3 mL  . methylPREDNISolone acetate (DEPO-MEDROL) injection 80 mg     Laterality: Bilateral  Location/Site:  Lateral branches at L5, S1 and S2  Needle size: 22 ga.  Needle type:spinal  Needle  Placement: Juncture of sacral ala and inferior articulating process for L5 and just last to foramen for S1 and S2  Findings:   -Comments: There was excellent flow of contrast along the articular pillars without intravascular flow.  Procedure Details: The fluoroscope beam is vertically oriented in AP and then obliqued 15 to 20 degrees to the ipsilateral side of the desired nerve to achieve the "Scotty dog" appearance.  The skin over the target area of the junction of the superior articulating process and the sacral ala if blocking the L5 dorsal rami was locally anesthetized with a 1 ml volume of 1% Lidocaine without Epinephrine.  The spinal needle was inserted and advanced in a trajectory view down to the target. Similar approach for S1 and S1 with target area just lateral to respective foramen.  After contact with periosteum and negative aspirate for blood and CSF, correct placement without intravascular or epidural spread was confirmed by injecting 0.5 ml. of Isovue-250.  A spot radiograph was obtained of this image.    Next, a 0.5 ml. volume of the injectate described above was injected at each target location.  Prior to the procedure, the patient was given a Pain Diary which was completed for baseline measurements.  After the procedure, the patient rated their pain every 30 minutes and will continue rating at this frequency for a total of 5 hours.  The patient has been asked to complete the Diary and return to [place] by mail, fax or hand delivered as  soon as possible.   Additional Comments:  The patient tolerated the procedure well Dressing: 2 x 2 sterile gauze and Band-Aid    Post-procedure details: Patient was observed during the procedure. Post-procedure instructions were reviewed.  Patient left the clinic in stable condition.    Clinical History: 12/27/2017 Lspine Xray: AP and lateral flexion and extension radiographs show the fusion of L4-5  and L5-S1 with  Residual  spondylolisthesis at both segments the interbody fusions appear  solid. The  L3-4 level with DDD and grade one anterolisthesis. Mild bilateral SI  sclerosis,no narrowing. -- MRI LUMBAR SPINE WITHOUT AND WITH CONTRAST (prior to surgery 2018) TECHNIQUE:  Multiplanar and multiecho pulse sequences of the lumbar spine were obtained without and with intravenous contrast.  CONTRAST:  46mL MULTIHANCE GADOBENATE DIMEGLUMINE 529 MG/ML IV SOLN  COMPARISON:  MRI dated 10/18/2014 and 01/19/2014 and CT scan dated 08/02/2011  FINDINGS: Segmentation:  Normal.  Alignment:  Normal.  Vertebrae: Solid fusion at L4-5. Moderate degenerative changes of the vertebral endplates at [id number] details text next  Conus medullaris: Extends to the L2 level and appears normal.  Paraspinal and other soft tissues: Multiple small lymph nodes in the para-aortic region, unchanged since 2013. Postsurgical changes posteriorly at L4-5.  Disc levels:  T12-L1 and L1-2:  Normal.  L2-3: Tiny disc bulge just to the left and right of midline without neural impingement, unchanged.  L3-4: Disc space narrowing with a broad based endplate osteophytes and hypertrophy of the ligamentum flavum and facet joints creating severe spinal stenosis loop in the deep loop in the the loop pump Impella mottled appearance slightly molar severe on image number 17 of series 6 as compared to the prior study, image 15 of series 6.  L4-5: Solid interbody and posterior fusion with no residual impingement. No pathologic enhancement after contrast administration.  L5-S1: PE progressive small central subligamentous disc protrusion slightly asymmetric to the left without focal neural impingement. Slight right and severe left facet arthritis, unchanged.  IMPRESSION: 1. Progressive severe spinal stenosis at L4. 2. Slight progression of small central subligamentous disc protrusion at L5-S1 without neural impingement. 3. Stable  severe left facet arthritis at L5-S1.   Electronically Signed   By: Lorriane Shire M.D.   On: 02/13/2016 14:28     Objective:  VS:  HT:    WT:   BMI:     BP:(!) 175/62  HR:63bpm  TEMP: ( )  RESP:  Physical Exam  Ortho Exam Imaging: No results found.

## 2019-03-10 NOTE — Procedures (Signed)
Sacroiliac Diagnostic Lateral Branch Nerve Block with Fluoroscopic Guidance   Patient: [name]      Date of Birth: [birth year] MRN: [id number] PCP: Laurey Morale, MD      Visit Date: 03/05/2019   Universal Protocol:    Date/Time: 03/10/2011:29 PM  Consent Given By: the patient  Position: PRONE  Additional Comments: Vital signs were monitored before and after the procedure. Patient was prepped and draped in the usual sterile fashion. The correct patient, procedure, and site was verified.   Injection Procedure Details:  Procedure Site One Meds Administered:  Meds ordered this encounter  Medications  . bupivacaine (MARCAINE) 0.5 % (with pres) injection 3 mL  . methylPREDNISolone acetate (DEPO-MEDROL) injection 80 mg     Laterality: Bilateral  Location/Site:  Lateral branches at L5, S1 and S2  Needle size: 22 ga.  Needle type:spinal  Needle Placement: Juncture of sacral ala and inferior articulating process for L5 and just last to foramen for S1 and S2  Findings:   -Comments: There was excellent flow of contrast along the articular pillars without intravascular flow.  Procedure Details: The fluoroscope beam is vertically oriented in AP and then obliqued 15 to 20 degrees to the ipsilateral side of the desired nerve to achieve the "Scotty dog" appearance.  The skin over the target area of the junction of the superior articulating process and the sacral ala if blocking the L5 dorsal rami was locally anesthetized with a 1 ml volume of 1% Lidocaine without Epinephrine.  The spinal needle was inserted and advanced in a trajectory view down to the target. Similar approach for S1 and S1 with target area just lateral to respective foramen.  After contact with periosteum and negative aspirate for blood and CSF, correct placement without intravascular or epidural spread was confirmed by injecting 0.5 ml. of Isovue-250.  A spot radiograph was obtained of this image.    Next,  a 0.5 ml. volume of the injectate described above was injected at each target location.  Prior to the procedure, the patient was given a Pain Diary which was completed for baseline measurements.  After the procedure, the patient rated their pain every 30 minutes and will continue rating at this frequency for a total of 5 hours.  The patient has been asked to complete the Diary and return to [place] by mail, fax or hand delivered as soon as possible.   Additional Comments:  The patient tolerated the procedure well Dressing: 2 x 2 sterile gauze and Band-Aid    Post-procedure details: Patient was observed during the procedure. Post-procedure instructions were reviewed.  Patient left the clinic in stable condition.

## 2019-03-12 DIAGNOSIS — D126 Benign neoplasm of colon, unspecified: Secondary | ICD-10-CM | POA: Diagnosis not present

## 2019-03-12 DIAGNOSIS — I6523 Occlusion and stenosis of bilateral carotid arteries: Secondary | ICD-10-CM | POA: Diagnosis not present

## 2019-03-12 DIAGNOSIS — I2510 Atherosclerotic heart disease of native coronary artery without angina pectoris: Secondary | ICD-10-CM | POA: Diagnosis not present

## 2019-03-12 DIAGNOSIS — M2560 Stiffness of unspecified joint, not elsewhere classified: Secondary | ICD-10-CM | POA: Diagnosis not present

## 2019-03-12 DIAGNOSIS — I639 Cerebral infarction, unspecified: Secondary | ICD-10-CM | POA: Diagnosis not present

## 2019-03-12 DIAGNOSIS — M8580 Other specified disorders of bone density and structure, unspecified site: Secondary | ICD-10-CM | POA: Diagnosis not present

## 2019-03-12 DIAGNOSIS — E1159 Type 2 diabetes mellitus with other circulatory complications: Secondary | ICD-10-CM | POA: Diagnosis not present

## 2019-03-12 DIAGNOSIS — F329 Major depressive disorder, single episode, unspecified: Secondary | ICD-10-CM | POA: Diagnosis not present

## 2019-03-12 DIAGNOSIS — E785 Hyperlipidemia, unspecified: Secondary | ICD-10-CM | POA: Diagnosis not present

## 2019-03-12 DIAGNOSIS — N62 Hypertrophy of breast: Secondary | ICD-10-CM | POA: Diagnosis not present

## 2019-03-12 DIAGNOSIS — I5189 Other ill-defined heart diseases: Secondary | ICD-10-CM | POA: Diagnosis not present

## 2019-03-12 DIAGNOSIS — H409 Unspecified glaucoma: Secondary | ICD-10-CM | POA: Diagnosis not present

## 2019-03-13 ENCOUNTER — Ambulatory Visit
Admission: RE | Admit: 2019-03-13 | Discharge: 2019-03-13 | Disposition: A | Discharge status: Home / Self Care | Payer: Medicare Other | Source: Ambulatory Visit | Attending: Hematology | Admitting: Hematology

## 2019-03-13 ENCOUNTER — Other Ambulatory Visit: Payer: Self-pay

## 2019-03-13 DIAGNOSIS — Z1231 Encounter for screening mammogram for malignant neoplasm of breast: Secondary | ICD-10-CM

## 2019-03-18 ENCOUNTER — Other Ambulatory Visit: Payer: Medicare Other

## 2019-03-23 ENCOUNTER — Ambulatory Visit: Payer: Medicare Other | Admitting: Hematology

## 2019-03-24 ENCOUNTER — Telehealth: Payer: Self-pay | Admitting: *Deleted

## 2019-03-25 ENCOUNTER — Telehealth: Payer: Self-pay | Admitting: Hematology

## 2019-03-25 NOTE — Telephone Encounter (Signed)
Returned patient's phone call regarding rescheduling 09/17 appointments, patient had to reschedule MRI in October so 09/17 appointment has been moved to 10/21 per patient's request.

## 2019-03-25 NOTE — Telephone Encounter (Signed)
Called pt lvm #1.  

## 2019-03-25 NOTE — Telephone Encounter (Signed)
She should follow with Jeneen Rinks and Dr. Louanne Skye, SI ablation not going to help and may not get approved. If prior intra-articualr injections at [facility] helped for a few months those could be continued - if no new MRI since fusion could think about that but they will eval accordingly.

## 2019-03-26 ENCOUNTER — Inpatient Hospital Stay: Payer: Medicare Other | Admitting: Hematology

## 2019-03-26 ENCOUNTER — Ambulatory Visit (INDEPENDENT_AMBULATORY_CARE_PROVIDER_SITE_OTHER): Payer: Medicare Other | Admitting: Surgery

## 2019-03-26 DIAGNOSIS — G8929 Other chronic pain: Secondary | ICD-10-CM

## 2019-03-26 DIAGNOSIS — I6523 Occlusion and stenosis of bilateral carotid arteries: Secondary | ICD-10-CM

## 2019-03-26 DIAGNOSIS — M533 Sacrococcygeal disorders, not elsewhere classified: Secondary | ICD-10-CM

## 2019-04-04 ENCOUNTER — Other Ambulatory Visit: Payer: Self-pay | Admitting: Cardiovascular Disease

## 2019-04-08 ENCOUNTER — Ambulatory Visit (INDEPENDENT_AMBULATORY_CARE_PROVIDER_SITE_OTHER): Payer: Medicare Other | Admitting: Specialist

## 2019-04-08 ENCOUNTER — Ambulatory Visit (INDEPENDENT_AMBULATORY_CARE_PROVIDER_SITE_OTHER): Payer: Medicare Other

## 2019-04-08 ENCOUNTER — Encounter: Payer: Self-pay | Admitting: Specialist

## 2019-04-08 VITALS — BP 228/88 | HR 50 | Ht 62.5 in | Wt 186.0 lb

## 2019-04-08 DIAGNOSIS — I6523 Occlusion and stenosis of bilateral carotid arteries: Secondary | ICD-10-CM | POA: Diagnosis not present

## 2019-04-08 DIAGNOSIS — M4326 Fusion of spine, lumbar region: Secondary | ICD-10-CM

## 2019-04-08 DIAGNOSIS — Z4889 Encounter for other specified surgical aftercare: Secondary | ICD-10-CM

## 2019-04-08 DIAGNOSIS — M4807 Spinal stenosis, lumbosacral region: Secondary | ICD-10-CM

## 2019-04-08 MED ORDER — DICLOFENAC SODIUM 50 MG PO TBEC: 50.0000 mg | DELAYED_RELEASE_TABLET | Freq: Every day | ORAL | 0 refills | Status: DC

## 2019-04-08 MED ORDER — TRAMADOL HCL 50 MG PO TABS: 100.0000 mg | ORAL_TABLET | Freq: Four times a day (QID) | ORAL | 0 refills | Status: DC | PRN

## 2019-04-08 NOTE — Patient Instructions (Signed)
Plan: Avoid bending, stooping and avoid lifting weights greater than 10 lbs. Avoid prolong standing and walking. Avoid frequent bending and stooping  No lifting greater than 10 lbs. May use ice or moist heat for pain. Weight loss is of benefit. Handicap license is approved. MRI of the lumbar spine with and without contrast to assess for spinal stenosis above the lumbar fusion  Tramadol  For pain Diclofenac for DDD.  Call Dr. Baldwin Crown office and let them know your blood pressure today is 228/88, it needs treatment ASAP.

## 2019-04-08 NOTE — Progress Notes (Signed)
Office Visit Note   Patient: [name]           Date of Birth: [birth year]           MRN: [id number] Visit Date: 04/08/2019              Requested by: Laurey Morale, MD [place],  [place] [address] PCP: Laurey Morale, MD   Assessment & Plan: Visit Diagnoses:  1. Fusion of lumbar spine   2. Encounter for other specified surgical aftercare   3. Spinal stenosis of lumbosacral region     Plan: Avoid bending, stooping and avoid lifting weights greater than 10 lbs. Avoid prolong standing and walking. Avoid frequent bending and stooping  No lifting greater than 10 lbs. May use ice or moist heat for pain. Weight loss is of benefit. Handicap license is approved. MRI of the lumbar spine with and without contrast to assess for spinal stenosis above the lumbar fusion  Tramadol  For pain Diclofenac for DDD.  Call Dr. Baldwin Crown office and let them know your blood pressure today is 228/88, it needs treatment ASAP.  Follow-Up Instructions: Return in about 3 weeks (around 04/29/2019).   Orders:  Orders Placed This Encounter  Procedures  . XR Lumbar Spine 2-3 Views   No orders of the defined types were placed in this encounter.     Procedures: No procedures performed   Clinical Data: No additional findings.   Subjective: Chief Complaint  Patient presents with  . Lower Back - Pain    73 year old female with history of diabetes, HTN and back pain post Lumbar fusion surgery 10/2016 surgery was removal of hardware L4-5 with extension of fusion to S1 with instrumentation L4 to S1 and decompression bilateral L3-4 for lateral recess stenosis. Pain is in the buttocks and radiates into right buttock and now into the right leg for the first time right side over the past one week. She doesn't walk without her cane. She has had numerous SI joint injections without relief of pain. She is able to walk in from the parking lot but can only walk the length of the  parking lot without a cane and does tend to stoop to relieve pain and with a can she go further. 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 No bowel or bladder difficulty. Does have improvement in pain with sitting but with sitting pain in the right buttocks is present. The pain is also better with lying down compared with sitting. Weight is decreased some and she is not trying. Has lost about 8 lbs.    Review of Systems  Constitutional: Positive for activity change and unexpected weight change. Negative for appetite change, chills, diaphoresis, fatigue and fever.  HENT: Negative.  Negative for congestion, dental problem, drooling, ear discharge, ear pain, facial swelling, hearing loss, mouth sores, nosebleeds, postnasal drip, rhinorrhea, sinus pressure, sinus pain, sneezing, sore throat, tinnitus, trouble swallowing and voice change.   Eyes: Negative.  Negative for photophobia, pain, discharge, redness, itching and visual disturbance (lase eye surgery 2 years ago Dr Herbert Deaner).  Respiratory: Negative.  Negative for apnea, cough, choking, chest tightness, shortness of breath, wheezing and stridor.   Cardiovascular: Negative.  Negative for chest pain, palpitations and leg swelling.  Gastrointestinal: Negative.  Negative for abdominal distention, abdominal pain, anal bleeding, blood in stool, constipation, diarrhea, nausea, rectal pain and vomiting.  Endocrine: Negative.  Negative for cold intolerance, heat intolerance,  polydipsia, polyphagia and polyuria.  Genitourinary: Negative.  Negative for difficulty urinating, dyspareunia, dysuria, enuresis, flank pain, frequency, hematuria and pelvic pain.  Musculoskeletal: Positive for back pain and gait problem. Negative for arthralgias, joint swelling, myalgias, neck pain and neck stiffness.  Skin: Negative.  Negative for color change, pallor, rash and wound.  Allergic/Immunologic: Negative.  Negative for environmental allergies, food  allergies and immunocompromised state.  Neurological: Negative for dizziness, tremors, seizures, syncope, facial asymmetry, speech difficulty, weakness, light-headedness, numbness and headaches.  Hematological: Negative.  Negative for adenopathy. Does not bruise/bleed easily.  Psychiatric/Behavioral: Negative.  Negative for agitation, behavioral problems, confusion, decreased concentration, dysphoric mood, hallucinations, self-injury, sleep disturbance and suicidal ideas. The patient is not nervous/anxious and is not hyperactive.      Objective: Vital Signs: BP (!) 228/88 (BP Location: Left Arm, Patient Position: Sitting)   Pulse (!) 50   Ht 5' 2.5" (1.588 m)   Wt 186 lb (84.4 kg)   BMI 33.48 kg/m   Physical Exam Constitutional:      Appearance: She is well-developed.  HENT:     Head: Normocephalic and atraumatic.  Eyes:     Pupils: Pupils are equal, round, and reactive to light.  Neck:     Musculoskeletal: Normal range of motion and neck supple.  Pulmonary:     Effort: Pulmonary effort is normal.     Breath sounds: Normal breath sounds.  Abdominal:     General: Bowel sounds are normal.     Palpations: Abdomen is soft.  Skin:    General: Skin is warm and dry.  Neurological:     Mental Status: She is alert and oriented to person, place, and time.  Psychiatric:        Behavior: Behavior normal.        Thought Content: Thought content normal.        Judgment: Judgment normal.     Back Exam   Tenderness  The patient is experiencing tenderness in the lumbar.  Range of Motion  Extension: abnormal  Flexion: normal  Lateral bend right: normal  Lateral bend left: normal  Rotation right: normal  Rotation left: normal   Muscle Strength  Right Quadriceps:  5/5  Left Quadriceps:  5/5  Right Hamstrings:  5/5  Left Hamstrings:  5/5   Tests  Straight leg raise right: negative Straight leg raise left: negative  Reflexes  Patellar: 0/4 Achilles: 0/4 Babinski's sign:  normal   Other  Toe walk: normal Heel walk: normal Sensation: normal Gait: normal  Erythema: no back redness Scars: present  Comments:  Pain is sharp and quick and radiates in the right buttock down the side of the right leg and back of the right leg to the right knee right where you bend the knee.       Specialty Comments:  No specialty comments available.  Imaging: Xr Lumbar Spine 2-3 Views  Result Date: 04/08/2019 AP and lateral flexion and extension of the lumbar spine shows worsening degenerative disc disease at L3-4, fusion L4-5 and L5-S1 with interbody fusion. The L5-S1 cage is retropulsed but this is unchanged compared with radiographs from 12/2017. SI joints are with mild DJD. No listhesis, no acute changes but worsening DDD and left lumbar scoliosis of 10-15 degrees. Hips do not show significant DJD.     PMFS History: Patient Active Problem List   Diagnosis Date Noted  . Anemia due to blood loss 11/02/2016    Priority: High    Class: Acute  .  Spondylolisthesis, lumbar region 10/30/2016    Priority: High    Class: Chronic  . Spinal stenosis of lumbar region 10/30/2016    Priority: High    Class: Chronic  . Retained orthopedic hardware 10/30/2016    Priority: High    Class: Chronic  . Hyperlipidemia 07/17/2017  . History of lumbar spinal fusion   . History of fusion of lumbar spine   . Lethargy 10/31/2016  . AKI (acute kidney injury) ([place]) 10/31/2016  . Chronic diastolic CHF (congestive heart failure) ([place]) 10/31/2016  . Cough   . Leukocytosis   . Spinal stenosis of lumbar region with neurogenic claudication 10/30/2016  . Genetic testing 09/14/2015  . Atypical ductal hyperplasia of left breast 08/24/2015  . Family history of breast cancer   . Family history of prostate cancer   . Family history of stomach cancer   . Abnormal nuclear stress test 12/29/2014  . Cervical spondylosis with myelopathy and radiculopathy 06/11/2014  . Carpal tunnel syndrome  05/14/2014  . Aftercare following surgery of the circulatory system, [facility] 03/11/2014  . Pain of right lower extremity 03/11/2014  . Atypical lobular hyperplasia of left breast 02/12/2014  . Fibromyalgia 09/21/2013  . Carotid artery disease ([place]) 02/29/2012  . Back abscess 01/22/2012  . Chest pain with moderate risk of acute coronary syndrome 12/09/2009  . Shortness of breath 06/10/2009  . Orthostatic hypotension 01/31/2009  . CAD (coronary artery disease) 12/23/2008  . Normocytic anemia 07/21/2008  . Headache(784.0) 07/21/2008  . History of TIA (transient ischemic attack) 07/21/2008  . Edema 02/27/2008  . Diabetes mellitus with complication ([place]) 99991111  . Depression 07/16/2007  . Dyslipidemia 02/21/2007  . Essential hypertension 02/21/2007  . ASTHMA 02/21/2007  . GERD 02/21/2007  . LOW BACK PAIN 02/21/2007   Past Medical History:  Diagnosis Date  . Abnormal nuclear stress test 12/29/2014  . Allergy   . Anemia, unspecified   . Arthritis   . Blood transfusion without reported diagnosis    2018  . CAD (coronary artery disease)    sees. Dr. Burt Knack. s/p cath with PCI of RCA (xience des) June 2010. Pt also with LAD and D2 dzs-NL FFR in both areas med Rx  . Carotid artery occlusion   . Cataract    sees Dr. Herbert Deaner   . Depressive disorder, not elsewhere classified   . Duodenal nodule   . Edema   . Fibromyalgia   . Gastric polyps    COLON  . GERD (gastroesophageal reflux disease)   . Glaucoma    narrow angle, sees Dr. Herbert Deaner   . Headache(784.0)   . Hearing loss    left ear  . Heart murmur    questionable  . Hemorrhoids   . HSV (herpes simplex virus) anogenital infection 05/2014  . HTN (hypertension)   . Hyperlipidemia   . Low back pain    she sees Dr. Louanne Skye, also Dr. Ernestina Patches for pain medications and Benjiman Core PA for injections   . Obesity   . Osteopenia 01/2018   T score -1.6 FRAX 7.1% / 1.4%  . Peripheral vascular disease ([place])   . Stroke [facility])    TIA  "many  yrs ago"    . TIA (transient ischemic attack)    also Hx of it.   . Type II or unspecified type diabetes mellitus without mention of complication, not stated as uncontrolled    sees Dr. Carrolyn Meiers     Family History  Problem Relation Age of Onset  . Hypertension  Mother   . Hypertension Sister   . Hyperlipidemia Sister   . Hypertension Brother   . Cancer Brother        PROSTATE/LUNG  . Diabetes Brother   . Heart disease Brother        Before age 41 - Bypass  . Varicose Veins Brother   . Cancer Father        Prostate and pancreatic   . Stomach cancer Maternal Grandmother 96  . Heart disease Maternal Grandfather   . Breast cancer Maternal Aunt 88  . Cancer Maternal Aunt        Stomach  . Breast cancer Cousin 55       maternal first cousin  . Prostate cancer Cousin 57  . Breast cancer Cousin 59  . Esophageal cancer Cousin   . Breast cancer Cousin 40       maternal first cousin's daughter  . Coronary artery disease Neg Hx        premature CAD  . Colon cancer Neg Hx   . Colon polyps Neg Hx   . Rectal cancer Neg Hx     Past Surgical History:  Procedure Laterality Date  . ABDOMINAL HYSTERECTOMY  age 23   TAH and USO  Leiomyomata  . ANTERIOR CERVICAL CORPECTOMY N/A 06/11/2014   Procedure: ANTERIOR CERVICAL CORPECTOMY CERVICAL SIX; WITH CERVICAL FIVE TO CERVICAL SEVEN ARTHRODESIS;  Surgeon: Ashok Pall, MD;  Location: [facility];  Service: Neurosurgery;  Laterality: N/A;  . BACK SURGERY  2008   lumb fusion  . BREAST BIOPSY Left 01/26/2014   Procedure: EXCISION LEFT BREAST MASS;  Surgeon: Adin Hector, MD;  Location: [facility];  Service: General;  Laterality: Left;  . BREAST LUMPECTOMY WITH RADIOACTIVE SEED LOCALIZATION Left 07/24/2017   Procedure: LEFT BREAST LUMPECTOMY WITH RADIOACTIVE SEED LOCALIZATION ERAS;  Surgeon: Erroll Luna, MD;  Location: [facility];  Service: General;  Laterality: Left;  . CARDIAC CATHETERIZATION    . CARDIAC  CATHETERIZATION N/A 12/29/2014   Procedure: Left Heart Cath and Coronary Angiography;  Surgeon: Sherren Mocha, MD;  Location: [facility];  Service: Cardiovascular;  Laterality: N/A;  . CAROTID ENDARTERECTOMY  march 2012   per Dr. Morton Amy  . CARPAL TUNNEL RELEASE Left 05/14/2014   Procedure: Left Carpal tunnel release;  Surgeon: Ashok Pall, MD;  Location: [facility];  Service: Neurosurgery;  Laterality: Left;  Left Carpal tunnel release  . CARPAL TUNNEL RELEASE Bilateral   . CHOLECYSTECTOMY    . COLONOSCOPY  06/26/2017   per Dr. Henrene Pastor, adenomatous polyp, repeat in 5 yrs    . CORONARY STENT PLACEMENT     Dr. Burt Knack  . DILATION AND CURETTAGE OF UTERUS    . ESOPHAGOGASTRODUODENOSCOPY  02-08-06   per Dr. Henrene Pastor, gastritis   . EYE SURGERY Left June 2016   Cataract  . GLAUCOMA SURGERY     with laser, per Dr. Henrene Pastor   . HARDWARE REMOVAL  10/30/2016   Procedure: HARDWARE REMOVAL;  Surgeon: Jessy Oto, MD;  Location: [facility];  Service: Orthopedics;;  . LUMBAR FUSION  2008  . POLYPECTOMY    . POSTERIOR LUMBAR FUSION  10/30/2016   Removal of hardware rods and pedicle screws lumbar four, Extension of fusion L4-5 to L5-S1 with reinsertion of screws at L 4, left L5-S1 transforaminal lumbar interbody fusion, Exploration  left L5 nerve root, bilateral decompressive laminectomy L3-4/notes 10/30/2016  . ULNAR NERVE TRANSPOSITION Left 05/14/2014   Procedure: Left  Ulnar nerve decompression;  Surgeon: Ashok Pall, MD;  Location: [facility];  Service: Neurosurgery;  Laterality: Left;  Left Ulnar nerve decompression   Social History   Occupational History  . Not on file  Tobacco Use  . Smoking status: Former Smoker    Quit date: 01/22/1967    Years since quitting: 52.2  . Smokeless tobacco: Never Used  Substance and Sexual Activity  . Alcohol use: No    Alcohol/week: 0.0 standard drinks  . Drug use: No  . Sexual activity: Never    Birth control/protection: Surgical, Post-menopausal     Comment: HYST-1st intercourse 48 yo-5 partners

## 2019-04-09 DIAGNOSIS — I10 Essential (primary) hypertension: Secondary | ICD-10-CM | POA: Diagnosis not present

## 2019-04-09 DIAGNOSIS — Z23 Encounter for immunization: Secondary | ICD-10-CM | POA: Diagnosis not present

## 2019-04-09 DIAGNOSIS — Z9114 Patient's other noncompliance with medication regimen: Secondary | ICD-10-CM | POA: Diagnosis not present

## 2019-04-09 DIAGNOSIS — Z794 Long term (current) use of insulin: Secondary | ICD-10-CM | POA: Diagnosis not present

## 2019-04-09 DIAGNOSIS — E1159 Type 2 diabetes mellitus with other circulatory complications: Secondary | ICD-10-CM | POA: Diagnosis not present

## 2019-04-15 ENCOUNTER — Encounter: Payer: Self-pay | Admitting: Gynecology

## 2019-04-21 ENCOUNTER — Other Ambulatory Visit: Payer: Medicare Other

## 2019-04-22 ENCOUNTER — Ambulatory Visit: Payer: Medicare Other | Admitting: Cardiovascular Disease

## 2019-04-22 NOTE — Progress Notes (Deleted)
Hypertension Clinic Initial Assessment:    Date:  04/22/2019   ID:  [name], DOB 1946/01/09, MRN [id number]  PCP:  Laurey Morale, MD  Cardiologist:  Sherren Mocha, MD  Nephrologist:  Referring MD: Laurey Morale, MD   CC: Hypertension  History of Present Illness:    [name] is a 73 y.o. female with a hx of CAD s/p RCA DES, carotid artery disease s/p R CEA, hypertension, orthostatic hypotension, diabetes, spinal stenosis, TIA/stroke,  here to establish care in the hypertension clinic.   In 2010 she had unstable angina and underwent PCI of the RCA.  She had a repeat cardiac catheterization 12/2014 that showed moderate, non-obstructive disease in the proximal LAD that was unchanged from prior.  Her RCA stent was patent.  Echo 12/2014 revealed LVEF 60 to 65% with grade 2 diastolic dysfunction.  She last saw Richardson Dopp, PA-C on 01/28/18.  At that time her BP was well-controlled on Lasix 40 mg daily, hydralazine 100 mg 3 times daily, irbesartan 300 mg daily, isosorbide mononitrate 60 mg daily, metoprolol succinate 100 mg daily, and spironolactone 25 mg daily.  More recently she was seen in orthopedics clinic on 04/08/2019 and her blood pressure was 228/88.  Previous antihypertensives:  Carvedilol- edema   Past Medical History:  Diagnosis Date  . Abnormal nuclear stress test 12/29/2014  . Allergy   . Anemia, unspecified   . Arthritis   . Blood transfusion without reported diagnosis    2018  . CAD (coronary artery disease)    sees. Dr. Burt Knack. s/p cath with PCI of RCA (xience des) June 2010. Pt also with LAD and D2 dzs-NL FFR in both areas med Rx  . Carotid artery occlusion   . Cataract    sees Dr. Herbert Deaner   . Depressive disorder, not elsewhere classified   . Duodenal nodule   . Edema   . Fibromyalgia   . Gastric polyps    COLON  . GERD (gastroesophageal reflux disease)   . Glaucoma    narrow angle, sees Dr. Herbert Deaner   . Headache(784.0)   . Hearing loss    left ear  . Heart murmur    questionable  . Hemorrhoids   . HSV (herpes simplex virus) anogenital infection 05/2014  . HTN (hypertension)   . Hyperlipidemia   . Low back pain    she sees Dr. Louanne Skye, also Dr. Ernestina Patches for pain medications and Benjiman Core PA for injections   . Obesity   . Osteopenia 01/2018   T score -1.6 FRAX 7.1% / 1.4%  . Peripheral vascular disease ([place])   . Stroke [facility])    TIA  "many yrs ago"    . TIA (transient ischemic attack)    also Hx of it.   . Type II or unspecified type diabetes mellitus without mention of complication, not stated as uncontrolled    sees Dr. Carrolyn Meiers     Past Surgical History:  Procedure Laterality Date  . ABDOMINAL HYSTERECTOMY  age 32   TAH and USO  Leiomyomata  . ANTERIOR CERVICAL CORPECTOMY N/A 06/11/2014   Procedure: ANTERIOR CERVICAL CORPECTOMY CERVICAL SIX; WITH CERVICAL FIVE TO CERVICAL SEVEN ARTHRODESIS;  Surgeon: Ashok Pall, MD;  Location: [place] [facility];  Service: Neurosurgery;  Laterality: N/A;  . BACK SURGERY  2008   lumb fusion  . BREAST BIOPSY Left 01/26/2014   Procedure: EXCISION LEFT BREAST MASS;  Surgeon: Adin Hector, MD;  Location: [place];  Service: General;  Laterality: Left;  . BREAST LUMPECTOMY WITH RADIOACTIVE SEED LOCALIZATION Left 07/24/2017   Procedure: LEFT BREAST LUMPECTOMY WITH RADIOACTIVE SEED LOCALIZATION ERAS;  Surgeon: Erroll Luna, MD;  Location: [place];  Service: General;  Laterality: Left;  . CARDIAC CATHETERIZATION    . CARDIAC CATHETERIZATION N/A 12/29/2014   Procedure: Left Heart Cath and Coronary Angiography;  Surgeon: Sherren Mocha, MD;  Location: [place] [facility];  Service: Cardiovascular;  Laterality: N/A;  . CAROTID ENDARTERECTOMY  march 2012   per Dr. Morton Amy  . CARPAL TUNNEL RELEASE Left 05/14/2014   Procedure: Left Carpal tunnel release;  Surgeon: Ashok Pall, MD;  Location: [place] [facility];  Service: Neurosurgery;  Laterality: Left;   Left Carpal tunnel release  . CARPAL TUNNEL RELEASE Bilateral   . CHOLECYSTECTOMY    . COLONOSCOPY  06/26/2017   per Dr. Henrene Pastor, adenomatous polyp, repeat in 5 yrs    . CORONARY STENT PLACEMENT     Dr. Burt Knack  . DILATION AND CURETTAGE OF UTERUS    . ESOPHAGOGASTRODUODENOSCOPY  02-08-06   per Dr. Henrene Pastor, gastritis   . EYE SURGERY Left June 2016   Cataract  . GLAUCOMA SURGERY     with laser, per Dr. Henrene Pastor   . HARDWARE REMOVAL  10/30/2016   Procedure: HARDWARE REMOVAL;  Surgeon: Jessy Oto, MD;  Location: [place];  Service: Orthopedics;;  . LUMBAR FUSION  2008  . POLYPECTOMY    . POSTERIOR LUMBAR FUSION  10/30/2016   Removal of hardware rods and pedicle screws lumbar four, Extension of fusion L4-5 to L5-S1 with reinsertion of screws at L 4, left L5-S1 transforaminal lumbar interbody fusion, Exploration  left L5 nerve root, bilateral decompressive laminectomy L3-4/notes 10/30/2016  . ULNAR NERVE TRANSPOSITION Left 05/14/2014   Procedure: Left Ulnar nerve decompression;  Surgeon: Ashok Pall, MD;  Location: [place] [facility];  Service: Neurosurgery;  Laterality: Left;  Left Ulnar nerve decompression    Current Medications: No outpatient medications have been marked as taking for the 04/22/19 encounter (Appointment) with Skeet Latch, MD.     Allergies:   Patient has no known allergies.   Social History   Socioeconomic History  . Marital status: Married    Spouse name: Not on file  . Number of children: 1  . Years of education: Not on file  . Highest education level: Not on file  Occupational History  . Not on file  Social Needs  . Financial resource strain: Not on file  . Food insecurity    Worry: Not on file    Inability: Not on file  . Transportation needs    Medical: Not on file    Non-medical: Not on file  Tobacco Use  . Smoking status: Former Smoker    Quit date: 01/22/1967    Years since quitting: 52.2  . Smokeless tobacco: Never Used  Substance and Sexual Activity  .  Alcohol use: No    Alcohol/week: 0.0 standard drinks  . Drug use: No  . Sexual activity: Never    Birth control/protection: Surgical, Post-menopausal    Comment: HYST-1st intercourse 89 yo-5 partners  Lifestyle  . Physical activity    Days per week: Not on file    Minutes per session: Not on file  . Stress: Not on file  Relationships  . Social Herbalist on phone: Not on file    Gets together: Not on file    Attends religious service: Not on file  Active member of club or organization: Not on file    Attends meetings of clubs or organizations: Not on file    Relationship status: Not on file  Other Topics Concern  . Not on file  Social History Narrative  . Not on file     Family History: [name] ***family history includes Breast cancer (age of onset: 33) in her cousin; Breast cancer (age of onset: 64) in her maternal aunt; Breast cancer (age of onset: 41) in her cousin; Breast cancer (age of onset: 36) in her cousin; Cancer in her brother, father, and maternal aunt; Diabetes in her brother; Esophageal cancer in her cousin; Heart disease in her brother and maternal grandfather; Hyperlipidemia in her sister; Hypertension in her brother, mother, and sister; Prostate cancer (age of onset: 85) in her cousin; Stomach cancer (age of onset: 46) in her maternal grandmother; Varicose Veins in her brother. There is no history of Coronary artery disease, Colon cancer, Colon polyps, or Rectal cancer.  ROS:   Please see the history of present illness.    *** All other systems reviewed and are negative.  EKGs/Labs/Other Studies Reviewed:    EKG:  EKG is *** ordered today.  The ekg ordered today demonstrates ***  Carotid Doppler 01/02/19: R patent CEA without restenosis.  L 1-39% stenosis in the ICA.  Myoview 12/20/14 Possible small defect in the inferolateral wall. Ischemia cannot be completely ruled out but there is significant diaphragmatic and soft tissue attenuation on RAW  images noted. This is a low risk study. EF55-65%  Renal artery Doppler 10/29/11: Normal bilaterally  LHC 12/29/14: Diagnostic Dominance: Right      Recent Labs: 02/20/2019: ALT 27; BUN 19; Creatinine, Ser 0.97; Hemoglobin 13.0; Platelets 154.0; Potassium 3.9; Sodium 139; TSH 2.60   Recent Lipid Panel    Component Value Date/Time   CHOL 123 02/20/2019 1022   TRIG 168.0 (H) 02/20/2019 1022   HDL 39.40 02/20/2019 1022   CHOLHDL 3 02/20/2019 1022   VLDL 33.6 02/20/2019 1022   LDLCALC 50 02/20/2019 1022    Physical Exam:    VS:  There were no vitals taken for this visit.    Wt Readings from Last 3 Encounters:  04/08/19 186 lb (84.4 kg)  02/20/19 185 lb 12.8 oz (84.3 kg)  01/22/19 184 lb (83.5 kg)     GEN: *** Well nourished, well developed in no acute distress HEENT: Normal NECK: No JVD; No carotid bruits LYMPHATICS: No lymphadenopathy CARDIAC: ***RRR, no murmurs, rubs, gallops RESPIRATORY:  Clear to auscultation without rales, wheezing or rhonchi  ABDOMEN: Soft, non-tender, non-distended MUSCULOSKELETAL:  No edema; No deformity  SKIN: Warm and dry NEUROLOGIC:  Alert and oriented x 3 PSYCHIATRIC:  Normal affect   ASSESSMENT:    No diagnosis found.  PLAN:    1. ***   Disposition:    FU with MD/PharmD in {gen number [id number] {Days to years:10300} {virtual or in-office}   Medication Adjustments/Labs and Tests Ordered: Current medicines are reviewed at length with the patient today.  Concerns regarding medicines are outlined above.  No orders of the defined types were placed in this encounter.  No orders of the defined types were placed in this encounter.    Signed, Skeet Latch, MD  04/22/2019 9:42 AM    [place]

## 2019-04-22 NOTE — Telephone Encounter (Signed)
Called pt and lvm #2 

## 2019-04-23 NOTE — Telephone Encounter (Signed)
Left message advising patient and asking her to call the front desk to schedule a follow up with Dr. Louanne Skye or Jeneen Rinks.

## 2019-04-24 NOTE — Progress Notes (Signed)
[place]   [phone] [phone]   Clinic Follow up Note   Patient Care Team: Laurey Morale, MD as PCP - Cyndia Diver, MD as PCP - Cardiology (Cardiology) Reynold Bowen, MD as Consulting Physician (Endocrinology) Ashok Pall, MD as Consulting Physician (Neurosurgery) Earlie Server, MD as Consulting Physician (Orthopedic Surgery)  Date of Service:  04/29/2019  CHIEF COMPLAINT: Follow up Atypical Lobular Hyperplasia [facility]) of the left breast   PREVIOUS THERAPY: She tried anastrozole in 11/2014 and Tamoxifen in 03/2015 for about one month each, and stopped due to side effects.   CURRENT THERAPY: observation   INTERVAL HISTORY:  [name] is here for a follow up of [place]. She presents to the clinic alone. She was last seen by me 1 year ago. She notes she is doing well except daily lower back pain which intermittently radiates to her b/l buttock. She notes she had back surgery with Dr Louanne Skye 2 years ago and her back pain did not get better afterwards, she feels it has gotten worse. She plans to have Lumbar MRI with him next week. She notes since her lumpectomy her left breast is smaller so her bras does not fit that area. She finds this manageable. She notes she sees her Gyn Dr Phineas Real but he is about to retire soon. She also sees her PCP, who does not do breast exams.    REVIEW OF SYSTEMS:   Constitutional: Denies fevers, chills or abnormal weight loss Eyes: Denies blurriness of vision Ears, nose, mouth, throat, and face: Denies mucositis or sore throat Respiratory: Denies cough, dyspnea or wheezes Cardiovascular: Denies palpitation, chest discomfort or lower extremity swelling Gastrointestinal:  Denies nausea, heartburn or change in bowel habits Skin: Denies abnormal skin rashes MSK: (+) Lower back pain radiating to b/l buttocks and posterior legs Lymphatics: Denies new lymphadenopathy or easy bruising Neurological:Denies  numbness, tingling or new weaknesses Behavioral/Psych: Mood is stable, no new changes  All other systems were reviewed with the patient and are negative.  MEDICAL HISTORY:  Past Medical History:  Diagnosis Date  . Abnormal nuclear stress test 12/29/2014  . Allergy   . Anemia, unspecified   . Arthritis   . Blood transfusion without reported diagnosis    2018  . CAD (coronary artery disease)    sees. Dr. Burt Knack. s/p cath with PCI of RCA (xience des) June 2010. Pt also with LAD and D2 dzs-NL FFR in both areas med Rx  . Carotid artery occlusion   . Cataract    sees Dr. Herbert Deaner   . Depressive disorder, not elsewhere classified   . Duodenal nodule   . Edema   . Fibromyalgia   . Gastric polyps    COLON  . GERD (gastroesophageal reflux disease)   . Glaucoma    narrow angle, sees Dr. Herbert Deaner   . Headache(784.0)   . Hearing loss    left ear  . Heart murmur    questionable  . Hemorrhoids   . HSV (herpes simplex virus) anogenital infection 05/2014  . HTN (hypertension)   . Hyperlipidemia   . Low back pain    she sees Dr. Louanne Skye, also Dr. Ernestina Patches for pain medications and Benjiman Core PA for injections   . Obesity   . Osteopenia 01/2018   T score -1.6 FRAX 7.1% / 1.4%  . Peripheral vascular disease ([place])   . Stroke [facility])    TIA  "many yrs ago"    . TIA (transient ischemic attack)  also Hx of it.   . Type II or unspecified type diabetes mellitus without mention of complication, not stated as uncontrolled    sees Dr. Carrolyn Meiers     SURGICAL HISTORY: Past Surgical History:  Procedure Laterality Date  . ABDOMINAL HYSTERECTOMY  age 46   TAH and USO  Leiomyomata  . ANTERIOR CERVICAL CORPECTOMY N/A 06/11/2014   Procedure: ANTERIOR CERVICAL CORPECTOMY CERVICAL SIX; WITH CERVICAL FIVE TO CERVICAL SEVEN ARTHRODESIS;  Surgeon: Ashok Pall, MD;  Location: [place] NEURO ORS;  Service: Neurosurgery;  Laterality: N/A;  . BACK SURGERY  2008   lumb fusion  . BREAST BIOPSY Left 01/26/2014    Procedure: EXCISION LEFT BREAST MASS;  Surgeon: Adin Hector, MD;  Location: [place];  Service: General;  Laterality: Left;  . BREAST LUMPECTOMY WITH RADIOACTIVE SEED LOCALIZATION Left 07/24/2017   Procedure: LEFT BREAST LUMPECTOMY WITH RADIOACTIVE SEED LOCALIZATION ERAS;  Surgeon: Erroll Luna, MD;  Location: [place];  Service: General;  Laterality: Left;  . CARDIAC CATHETERIZATION    . CARDIAC CATHETERIZATION N/A 12/29/2014   Procedure: Left Heart Cath and Coronary Angiography;  Surgeon: Sherren Mocha, MD;  Location: [place] CV LAB;  Service: Cardiovascular;  Laterality: N/A;  . CAROTID ENDARTERECTOMY  march 2012   per Dr. Morton Amy  . CARPAL TUNNEL RELEASE Left 05/14/2014   Procedure: Left Carpal tunnel release;  Surgeon: Ashok Pall, MD;  Location: [place] NEURO ORS;  Service: Neurosurgery;  Laterality: Left;  Left Carpal tunnel release  . CARPAL TUNNEL RELEASE Bilateral   . CHOLECYSTECTOMY    . COLONOSCOPY  06/26/2017   per Dr. Henrene Pastor, adenomatous polyp, repeat in 5 yrs    . CORONARY STENT PLACEMENT     Dr. Burt Knack  . DILATION AND CURETTAGE OF UTERUS    . ESOPHAGOGASTRODUODENOSCOPY  02-08-06   per Dr. Henrene Pastor, gastritis   . EYE SURGERY Left June 2016   Cataract  . GLAUCOMA SURGERY     with laser, per Dr. Henrene Pastor   . HARDWARE REMOVAL  10/30/2016   Procedure: HARDWARE REMOVAL;  Surgeon: Jessy Oto, MD;  Location: [place];  Service: Orthopedics;;  . LUMBAR FUSION  2008  . POLYPECTOMY    . POSTERIOR LUMBAR FUSION  10/30/2016   Removal of hardware rods and pedicle screws lumbar four, Extension of fusion L4-5 to L5-S1 with reinsertion of screws at L 4, left L5-S1 transforaminal lumbar interbody fusion, Exploration  left L5 nerve root, bilateral decompressive laminectomy L3-4/notes 10/30/2016  . ULNAR NERVE TRANSPOSITION Left 05/14/2014   Procedure: Left Ulnar nerve decompression;  Surgeon: Ashok Pall, MD;  Location: [place] NEURO ORS;  Service:  Neurosurgery;  Laterality: Left;  Left Ulnar nerve decompression    I have reviewed the social history and family history with the patient and they are unchanged from previous note.  ALLERGIES:  has No Known Allergies.  MEDICATIONS:  Current Outpatient Medications  Medication Sig Dispense Refill  . albuterol (PROVENTIL HFA;VENTOLIN HFA) 108 (90 Base) MCG/ACT inhaler Inhale 2 puffs into the lungs every 4 (four) hours as needed for wheezing or shortness of breath. 1 Inhaler 2  . aspirin 81 MG tablet Take 81 mg by mouth daily.     . clobetasol cream (TEMOVATE) 0.05 % Apply to the outside of the vagina once daily 30 g 1  . diclofenac sodium (VOLTAREN) 1 % GEL Apply 2 g topically 4 (four) times daily as needed (for pain).     Marland Kitchen ergocalciferol (VITAMIN D2) 50000  UNITS capsule Take 50,000 Units by mouth every Saturday. On Saturday.    . furosemide (LASIX) 40 MG tablet TAKE ONE TABLET BY MOUTH ONCE DAILY 30 tablet 0  . HUMALOG 100 UNIT/ML injection     . hydrALAZINE (APRESOLINE) 100 MG tablet TAKE 1 TABLET BY MOUTH THREE TIMES DAILY 270 tablet 0  . Insulin Disposable Pump (V-GO 40) KIT   2  . Insulin Human (INSULIN PUMP) SOLN Inject into the skin. Uses Novolog insulin for pump    . irbesartan (AVAPRO) 300 MG tablet Take 1 tablet (300 mg total) by mouth daily. Please schedule an appt for further refills. 1st attempt 90 tablet 0  . isosorbide mononitrate (IMDUR) 30 MG 24 hr tablet TAKE 1 TABLET BY MOUTH ONCE DAILY(THIS IS TO BE TAKEN WITH THE 60MG TAB OF IMDUR TO=90MG DAILY) 90 tablet 1  . isosorbide mononitrate (IMDUR) 60 MG 24 hr tablet Take 1 tablet (60 mg total) by mouth daily. Pt needs to call and make appt for further refills - 1st attempt 30 tablet 0  . latanoprost (XALATAN) 0.005 % ophthalmic solution Place 1 drop into both eyes at bedtime.     . metoprolol succinate (TOPROL-XL) 100 MG 24 hr tablet TAKE 1 TABLET BY MOUTH ONCE DAILY OR  IMMEDIATELY  FOLLOWING  A  MEAL 90 tablet 0  . montelukast  (SINGULAIR) 10 MG tablet Take 10 mg by mouth daily.    . naproxen (NAPROSYN) 500 MG tablet Take 1 tablet (500 mg total) by mouth 2 (two) times daily with a meal. (Patient taking differently: Take 500 mg by mouth daily as needed for mild pain. ) 60 tablet 2  . nitroGLYCERIN (NITROSTAT) 0.4 MG SL tablet Place 1 tablet (0.4 mg total) under the tongue every 5 (five) minutes as needed for chest pain. 25 tablet 1  . nystatin-triamcinolone (MYCOLOG II) cream Apply 1 application topically 2 (two) times daily. 30 g 2  . pantoprazole (PROTONIX) 40 MG tablet Take 40 mg by mouth daily.     . rosuvastatin (CRESTOR) 40 MG tablet TAKE 1 TABLET BY MOUTH ONCE DAILY 90 tablet 3  . spironolactone (ALDACTONE) 50 MG tablet Take 1 tablet (50 mg total) by mouth daily. 90 tablet 3  . terconazole (TERAZOL 7) 0.4 % vaginal cream Place 1 applicator vaginally at bedtime. 45 g 0  . traMADol (ULTRAM) 50 MG tablet Take 2 tablets (100 mg total) by mouth every 6 (six) hours as needed. 30 tablet 0   No current facility-administered medications for this visit.     PHYSICAL EXAMINATION: ECOG PERFORMANCE STATUS: 3 - Symptomatic, >50% confined to bed  Vitals:   04/29/19 0909  BP: (!) 145/82  Pulse: 80  Resp: 18  Temp: 98.2 F (36.8 C)  SpO2: 100%   Filed Weights   04/29/19 0909  Weight: 189 lb 9.6 oz (86 kg)    GENERAL:alert, no distress and comfortable SKIN: skin color, texture, turgor are normal, no rashes or significant lesions EYES: normal, Conjunctiva are pink and non-injected, sclera clear  NECK: supple, thyroid normal size, non-tender, without nodularity LYMPH:  no palpable lymphadenopathy in the cervical, axillary  LUNGS: clear to auscultation and percussion with normal breathing effort HEART: regular rate & rhythm and no murmurs and no lower extremity edema ABDOMEN:abdomen soft, non-tender and normal bowel sounds Musculoskeletal:no cyanosis of digits and no clubbing  NEURO: alert & oriented x 3 with fluent  speech, no focal motor/sensory deficits BREAST: S/p left lumpectomy: surgical incision healed well with  Moderate scar tissue at incision line. No palpable mass, nodules or adenopathy bilaterally. Breast exam benign.   LABORATORY DATA:  I have reviewed the data as listed CBC Latest Ref Rng & Units 02/20/2019 03/26/2017 11/19/2016  WBC 4.0 - 10.5 K/uL 8.2 8.8 8.2  Hemoglobin 12.0 - 15.0 g/dL 13.0 12.3 10.9(L)  Hematocrit 36.0 - 46.0 % 39.7 38.3 34.7(L)  Platelets 150.0 - 400.0 K/uL 154.0 184.0 317     CMP Latest Ref Rng & Units 02/20/2019 03/26/2017 11/02/2016  Glucose 70 - 99 mg/dL 335(H) 210(H) 125(H)  BUN 6 - 23 mg/dL 19 17 23(H)  Creatinine 0.40 - 1.20 mg/dL 0.97 0.91 1.19(H)  Sodium 135 - 145 mEq/L 139 144 139  Potassium 3.5 - 5.1 mEq/L 3.9 3.8 3.4(L)  Chloride 96 - 112 mEq/L 102 106 108  CO2 19 - 32 mEq/L _0 Calcium 8.4 - 10.5 mg/dL 9.3 9.7 8.1(L)  Total Protein 6.0 - 8.3 g/dL 6.2 6.5 -  Total Bilirubin 0.2 - 1.2 mg/dL 0.7 0.9 -  Alkaline Phos 39 - 117 U/L 91 81 -  AST 0 - 37 U/L 20 14 -  ALT 0 - 35 U/L 27 11 -      RADIOGRAPHIC STUDIES: I have personally reviewed the radiological images as listed and agreed with the findings in the report. No results found.   ASSESSMENT & PLAN:  [name] is a 73 y.o. female with   1. Left breast ALH  -She is s/p left breast lumpectomy as of 01/26/14, which showed [facility] -Dr. Lona Kettle has previously extensively discussed the risks of developing breast cancer and chemoprevention with tamoxifen versus anastrozole. The overall benefit of chemoprevention is reduce her breast cancer by 50% with absolute benefit 3.5-4% risk reduction. She agreed to take chemoprevention  -She has tried anastrozole and tamoxifen but could not tolerate due to side effects. -We previously discussed alternative chemoprevention with  Raloxifene, and she decided not to try  -She has strong family history of breast cancer, but her genetic testing was negative.  -Her prior left breast mass found on 06/2018 biopsy was removed by lumpectomy in 07/2017 and found to be benign Fibrocystic changes.  -Due to her dense breast tissue (category C), and high risk for breast cancer, I recommend her to have breast MRI every year along with yearly mammogram. She agreed. She tried a few months ago but could not do it due to poor iv access  -She has significant back pain and poorly controlled DM which is being managed by her PCP and Dr. Louanne Skye. From a breast cancer standpoint she is clinically doing well . Her physical exam unremarkable, no spinal tenderness. There is no clinical concern for breast cancer -Continue surveillance. She is due for Mammogram, will set up within a month. Will set up MRI afterward in April or May of 2021. She is agreeable.  -She opted to continue f/u and breast cancer surveillance with out clinic. F/u in 1 year. She will continue labs from her PCP in interim.    2. Iron deficient anemia -she has developed moderate microcytic anemia in the past 3-4 month, ferritin 11, low iron and sat, supports iron deficient anemia. Possible related to her prior surgery.    -She had repeated her colonoscopy in December 2018, which showed a 3 mm polyps in descending colon removed, diverticula, and internal hemorrhoids. -Anemia resolved in 02/2019. Continue ferrous sulfate 325 mg 2-3 times a day.   3. HTN, DM, CAD, history of  TIA -She will continue follow-up with her primary care physician -Her 02/2019 A1c was 15.2. She started her monitoring kit, but malfunctioned. She is waiting on new kit. She is currently on Insulin.   4. Depression  -Will consult with social worker for further management of this.   5. Chronic Lower back pain  -She notes she has had 2 back surgeries, last in 2018. She notes her pain has only gotten worse since and now radiates down the back of her buttocks and legs.  -She ambulated without assistance at home but otherwise will use cane  outside of home.  -She has lumbar MRI set for 05/04/19 with Dr. Louanne Skye.   Plan -Screening Mammogram in 2-4 weeks at [facility], ordered today  -Breast MRI in April or May 2021, she will schedule with GI  -f/u in 1 year with NP [name]    No problem-specific Assessment & Plan notes found for this encounter.   Orders Placed This Encounter  Procedures  . MM Digital Screening    Standing Status:   Future    Standing Expiration Date:   04/28/2020    Scheduling Instructions:     She is due, please schedule as soon as possible    Order Specific Question:   Reason for Exam (SYMPTOM  OR DIAGNOSIS REQUIRED)    Answer:   screening    Order Specific Question:   Preferred imaging location?    Answer:   External   All questions were answered. The patient knows to call the clinic with any problems, questions or concerns. No barriers to learning was detected. I spent 15 minutes counseling the patient face to face. The total time spent in the appointment was 20 minutes and more than 50% was on counseling and review of test results     Truitt Merle, MD 04/29/2019   I, Joslyn Devon, am acting as scribe for Truitt Merle, MD.   I have reviewed the above documentation for accuracy and completeness, and I agree with the above.

## 2019-04-27 NOTE — Progress Notes (Signed)
Cardiology Office Note:    Date:  04/28/2019   ID:  [name], DOB 09/15/1945, MRN [id number]  PCP:  Karen Morale, MD  Cardiologist:  Karen Mocha, MD   Electrophysiologist:  None   Referring MD: Karen Morale, MD   Chief Complaint  Patient presents with  . Follow-up    CAD     History of Present Illness:    [name] is a 73 y.o. female with:   Coronary artery disease   [place] in 2010 >> PCI: DES to RCA  Cath 12/2014: mod non-obs dz in LAD, RCA stent patent  Chronic diastolic CHF  Echo 3/35: EF 60-65, Gr 2 DD  Diabetes mellitus 2  Hypertension   Intol of Carvedilol (swelling)  Hyperlipidemia   S/p Prior TIA  Carotid artery dz  S/p R CEA  GERD  Lumbar spine disease  S/p prior fusion   [name] was last seen in July 2019.  She returns for follow-up.  She is here alone.  She is mainly bothered by chronic back pain from multiple surgeries in the past.  She is limited in her activity.  She has not had chest pain, significant shortness of breath, paroxysmal nocturnal dyspnea, syncope, leg swelling.  Prior CV studies:   The following studies were reviewed today:  Carotid US 01/02/2019 Summary: Right Carotid: Patent right carotid endarterectomy site with no evidence of                restenosis. Mild hyperplasia noted in the distal patch. Left Carotid: Velocities in the left ICA are consistent with a 1-39% stenosis.               Non-hemodynamically significant plaque noted in the CCA. The ECA               appears <50% stenosed. Acoustic shadowing may obscure higher               velocities. Vertebrals:  Bilateral vertebral arteries demonstrate antegrade flow. Subclavians: Normal flow hemodynamics were seen in bilateral subclavian              arteries.   Carotid US 45/62 Patent R CEA; LICA 5-63  LHC 8/93/73 LM: Ostial 30 LAD: prox 50, dist 50; D1 60 LCx: lum irregs RCA: prox 30, mid stent ok Med Rx  Echo 12/30/14 EF  60% to 65%. Wall motion was normal; Grade 2 diastolic dysfunction   Myoview 12/20/14 Possible small defect in the inferolateral wall. Ischemia cannot be completely ruled out but there is significant diaphragmatic and soft tissue attenuation on RAW images noted. This is a low risk study. EF55-65%  Renal Art [place] 10/29/11 Normal renal arteries bilaterally  Past Medical History:  Diagnosis Date  . Abnormal nuclear stress test 12/29/2014  . Allergy   . Anemia, unspecified   . Arthritis   . Blood transfusion without reported diagnosis    2018  . CAD (coronary artery disease)    sees. Dr. Burt [name]. s/p cath with PCI of RCA (xience des) June 2010. Pt also with LAD and D2 dzs-NL FFR in both areas med Rx  . Carotid artery occlusion   . Cataract    sees Dr. Herbert [name]   . Depressive disorder, not elsewhere classified   . Duodenal nodule   . Edema   . Fibromyalgia   . Gastric polyps    COLON  . GERD (gastroesophageal reflux disease)   . Glaucoma    narrow angle, sees  Dr. Herbert [name]   . Headache(784.0)   . Hearing loss    left ear  . Heart murmur    questionable  . Hemorrhoids   . HSV (herpes simplex virus) anogenital infection 05/2014  . HTN (hypertension)   . Hyperlipidemia   . Low back pain    she sees Dr. Louanne [name], also Dr. Ernestina [name] for pain medications and Karen Core PA for injections   . Obesity   . Osteopenia 01/2018   T score -1.6 FRAX 7.1% / 1.4%  . Peripheral vascular disease ([place])   . Stroke [facility])    TIA  "many yrs ago"    . TIA (transient ischemic attack)    also Hx of it.   . Type II or unspecified type diabetes mellitus without mention of complication, not stated as uncontrolled    sees Dr. Carrolyn [name]    Surgical Hx: The patient  has a past surgical history that includes Colonoscopy (06/26/2017); Esophagogastroduodenoscopy (02-08-06); Glaucoma surgery; Carotid endarterectomy (march 2012); Abdominal hysterectomy (age 34); Cholecystectomy; Dilation and curettage of uterus; Breast  biopsy (Left, 01/26/2014); Cardiac catheterization; Carpal tunnel release (Left, 05/14/2014); Ulnar nerve transposition (Left, 05/14/2014); Anterior cervical corpectomy (N/A, 06/11/2014); Cardiac catheterization (N/A, 12/29/2014); Eye surgery (Left, June 2016); Back surgery (2008); Carpal tunnel release (Bilateral); Lumbar fusion (2008); Posterior lumbar fusion (10/30/2016); Hardware Removal (10/30/2016); Polypectomy; Coronary stent placement; and Breast lumpectomy with radioactive seed localization (Left, 07/24/2017).   Current Medications: Current Meds  Medication Sig  . albuterol (PROVENTIL HFA;VENTOLIN HFA) 108 (90 Base) MCG/ACT inhaler Inhale 2 puffs into the lungs every 4 (four) hours as needed for wheezing or shortness of breath.  Marland Kitchen aspirin 81 MG tablet Take 81 mg by mouth daily.   . clobetasol cream (TEMOVATE) 0.05 % Apply to the outside of the vagina once daily  . diclofenac sodium (VOLTAREN) 1 % GEL Apply 2 g topically 4 (four) times daily as needed (for pain).   Marland Kitchen ergocalciferol (VITAMIN D2) 50000 UNITS capsule Take 50,000 Units by mouth every Saturday. On Saturday.  . furosemide (LASIX) 40 MG tablet TAKE ONE TABLET BY MOUTH ONCE DAILY  . HUMALOG 100 UNIT/ML injection   . hydrALAZINE (APRESOLINE) 100 MG tablet TAKE 1 TABLET BY MOUTH THREE TIMES DAILY  . Insulin Disposable Pump (V-GO 40) KIT   . Insulin Human (INSULIN PUMP) SOLN Inject into the skin. Uses Novolog insulin for pump  . irbesartan (AVAPRO) 300 MG tablet Take 1 tablet (300 mg total) by mouth daily. Please schedule an appt for further refills. 1st attempt  . isosorbide mononitrate (IMDUR) 30 MG 24 hr tablet TAKE 1 TABLET BY MOUTH ONCE DAILY(THIS IS TO BE TAKEN WITH THE 60MG TAB OF IMDUR TO=90MG DAILY)  . isosorbide mononitrate (IMDUR) 60 MG 24 hr tablet Take 1 tablet (60 mg total) by mouth daily. Pt needs to call and make appt for further refills - 1st attempt  . latanoprost (XALATAN) 0.005 % ophthalmic solution Place 1 drop into both  eyes at bedtime.   . metoprolol succinate (TOPROL-XL) 100 MG 24 hr tablet TAKE 1 TABLET BY MOUTH ONCE DAILY OR  IMMEDIATELY  FOLLOWING  A  MEAL  . montelukast (SINGULAIR) 10 MG tablet Take 10 mg by mouth daily.  . naproxen (NAPROSYN) 500 MG tablet Take 1 tablet (500 mg total) by mouth 2 (two) times daily with a meal. (Patient taking differently: Take 500 mg by mouth daily as needed for mild pain. )  . nitroGLYCERIN (NITROSTAT) 0.4 MG SL tablet Place 1 tablet (  0.4 mg total) under the tongue every 5 (five) minutes as needed for chest pain.  Marland Kitchen nystatin-triamcinolone (MYCOLOG II) cream Apply 1 application topically 2 (two) times daily.  . pantoprazole (PROTONIX) 40 MG tablet Take 40 mg by mouth daily.   . rosuvastatin (CRESTOR) 40 MG tablet TAKE 1 TABLET BY MOUTH ONCE DAILY  . terconazole (TERAZOL 7) 0.4 % vaginal cream Place 1 applicator vaginally at bedtime.  . traMADol (ULTRAM) 50 MG tablet Take 2 tablets (100 mg total) by mouth every 6 (six) hours as needed.  . [DISCONTINUED] spironolactone (ALDACTONE) 25 MG tablet Take 1 tablet (25 mg total) by mouth daily. Please schedule an appt further refills, 1st attempt     Allergies:   Patient has no known allergies.   Social History   Tobacco Use  . Smoking status: Former Smoker    Quit date: 01/22/1967    Years since quitting: 52.2  . Smokeless tobacco: Never Used  Substance Use Topics  . Alcohol use: No    Alcohol/week: 0.0 standard drinks  . Drug use: No     Family Hx: [name] family history includes Breast cancer (age of onset: 19) in her cousin; Breast cancer (age of onset: 53) in her maternal aunt; Breast cancer (age of onset: 31) in her cousin; Breast cancer (age of onset: 9) in her cousin; Cancer in her brother, father, and maternal aunt; Diabetes in her brother; Esophageal cancer in her cousin; Heart disease in her brother and maternal grandfather; Hyperlipidemia in her sister; Hypertension in her brother, mother, and sister;  Prostate cancer (age of onset: 39) in her cousin; Stomach cancer (age of onset: 33) in her maternal grandmother; Varicose Veins in her brother. There is no history of Coronary artery disease, Colon cancer, Colon polyps, or Rectal cancer.  ROS:   Please see the history of present illness.    Review of Systems  Gastrointestinal: Negative for hematochezia and melena.  Genitourinary: Negative for hematuria.   All other systems reviewed and are negative.   EKGs/Labs/Other Test Reviewed:    EKG:  EKG is   ordered today.  The ekg ordered today demonstrates sinus bradycardia, heart rate 59, left ward axis, QTC 437, no change from prior tracing  Recent Labs: 02/20/2019: ALT 27; BUN 19; Creatinine, Ser 0.97; Hemoglobin 13.0; Platelets 154.0; Potassium 3.9; Sodium 139; TSH 2.60   Recent Lipid Panel Lab Results  Component Value Date/Time   CHOL 123 02/20/2019 10:22 AM   TRIG 168.0 (H) 02/20/2019 10:22 AM   HDL 39.40 02/20/2019 10:22 AM   CHOLHDL 3 02/20/2019 10:22 AM   LDLCALC 50 02/20/2019 10:22 AM    Physical Exam:    VS:  BP (!) 160/84   Pulse 60   Ht 5' 3"  (1.6 m)   Wt 191 lb 6.4 oz (86.8 kg)   SpO2 98%   BMI 33.90 kg/m     Wt Readings from Last 3 Encounters:  04/28/19 191 lb 6.4 oz (86.8 kg)  04/08/19 186 lb (84.4 kg)  02/20/19 185 lb 12.8 oz (84.3 kg)     Physical Exam  Constitutional: She is oriented to person, place, and time. She appears well-developed and well-nourished. No distress.  HENT:  Head: Normocephalic and atraumatic.  Eyes: No scleral icterus.  Neck: No thyromegaly present.  Cardiovascular: Normal rate, regular rhythm and normal heart sounds.  No murmur heard. Pulmonary/Chest: Effort normal and breath sounds normal. She has no rales.  Abdominal: Soft.  Musculoskeletal:  General: No edema.  Lymphadenopathy:    She has no cervical adenopathy.  Neurological: She is alert and oriented to person, place, and time.  Skin: Skin is warm and dry.   Psychiatric: She has a normal mood and affect.    ASSESSMENT & PLAN:    1. Coronary artery disease involving native coronary artery of native heart without angina pectoris History of drug-eluting stent to the RCA in 2010.  Cardiac catheterization in 2016 demonstrated patent RCA stent and moderate nonobstructive disease in the LAD.  She is doing well without anginal symptoms.  Continue aspirin, rosuvastatin.  FU in 1 year.   2. Chronic diastolic CHF (congestive heart failure) (HCC) Overall, volume status appears stable.  Continue current therapy.  3. Bilateral carotid artery disease, unspecified type (HCC) Continue aspirin, statin.  Follow-up with vascular surgery as planned.  4. Diabetes mellitus with complication [facility]) Continue follow-up with endocrinology.  Empagliflozin could be considered given the results of the EMPA-REG OUTCOME trial.  5. Essential hypertension Blood pressure is uncontrolled.  She is intolerant to carvedilol.  She also notes intolerance to amlodipine.  She is on max dose hydralazine.  I cannot increase her metoprolol any further secondary to bradycardia.  I am not certain that increasing her Imdur would help any further.  I will increase her Spironolactone to 50 mg once daily.  Obtain BMET in 1 week.  I will see if she can follow up with Dr. Oval Linsey in the [facility]N Clinic. She is certain that someone from [facility] contacted her recently to arrange an appointment.  If not, we will have her FU in our HTN clinic at [facility].    6. Hyperlipidemia, unspecified hyperlipidemia type LDL optimal on most recent lab work.  Continue current Rx.     Dispo:  Return in about 1 year (around 04/27/2020) for Routine Follow Up, w/ Dr. Burt [name], or Richardson Dopp, PA-C, in person.   Medication Adjustments/Labs and Tests Ordered: Current medicines are reviewed at length with the patient today.  Concerns regarding medicines are outlined above.  Tests Ordered: Orders Placed This Encounter   Procedures  . Basic metabolic panel  . EKG 12-Lead   Medication Changes: Meds ordered this encounter  Medications  . spironolactone (ALDACTONE) 50 MG tablet    Sig: Take 1 tablet (50 mg total) by mouth daily.    Dispense:  90 tablet    Refill:  3    Signed, Richardson Dopp, PA-C  04/28/2019 1:37 PM    [facility] [address], [place], [place]  [address] Phone: [phone]; Fax: [phone]

## 2019-04-28 ENCOUNTER — Ambulatory Visit (INDEPENDENT_AMBULATORY_CARE_PROVIDER_SITE_OTHER): Payer: Medicare Other | Admitting: Physician Assistant

## 2019-04-28 ENCOUNTER — Encounter: Payer: Self-pay | Admitting: Physician Assistant

## 2019-04-28 ENCOUNTER — Other Ambulatory Visit: Payer: Self-pay

## 2019-04-28 VITALS — BP 160/84 | HR 60 | Ht 63.0 in | Wt 191.4 lb

## 2019-04-28 DIAGNOSIS — I779 Disorder of arteries and arterioles, unspecified: Secondary | ICD-10-CM | POA: Diagnosis not present

## 2019-04-28 DIAGNOSIS — I5032 Chronic diastolic (congestive) heart failure: Secondary | ICD-10-CM

## 2019-04-28 DIAGNOSIS — E118 Type 2 diabetes mellitus with unspecified complications: Secondary | ICD-10-CM

## 2019-04-28 DIAGNOSIS — I6523 Occlusion and stenosis of bilateral carotid arteries: Secondary | ICD-10-CM

## 2019-04-28 DIAGNOSIS — I2510 Atherosclerotic heart disease of native coronary artery without angina pectoris: Secondary | ICD-10-CM

## 2019-04-28 DIAGNOSIS — E785 Hyperlipidemia, unspecified: Secondary | ICD-10-CM | POA: Diagnosis not present

## 2019-04-28 DIAGNOSIS — I10 Essential (primary) hypertension: Secondary | ICD-10-CM | POA: Diagnosis not present

## 2019-04-28 MED ORDER — SPIRONOLACTONE 50 MG PO TABS: 50.0000 mg | ORAL_TABLET | Freq: Every day | ORAL | 3 refills | Status: DC

## 2019-04-28 NOTE — Patient Instructions (Addendum)
Medication Instructions:  Your physician has recommended you make the following change in your medication:  1.  INCREASE the Spironolactone to 50 mg daily.  You may take 2 of the 25 mg tablets to use them up  *If you need a refill on your cardiac medications before your next appointment, please call your pharmacy*  Lab Work: 1 WEEK:  05/05/2019 COME TO THE OFFICE FOR BMET  If you have labs (blood work) drawn today and your tests are completely normal, you will receive your results only by: Marland Kitchen MyChart Message (if you have MyChart) OR . A paper copy in the mail If you have any lab test that is abnormal or we need to change your treatment, we will call you to review the results.  Testing/Procedures: None ordered  Follow-Up: At [facility], you and your health needs are our priority.  As part of our continuing mission to provide you with exceptional heart care, we have created designated Provider Care Teams.  These Care Teams include your primary Cardiologist (physician) and Advanced Practice Providers (APPs -  Physician Assistants and Nurse Practitioners) who all work together to provide you with the care you need, when you need it.  Your physician recommends that you  follow-up with Dr. Oval Linsey for blood pressure.   Your next appointment:   12 months  The format for your next appointment:   Either In Person or Virtual  Provider:   You may see Sherren Mocha, MD or one of the following Advanced Practice Providers on your designated Care Team:    Richardson Dopp, PA-C  [place], [place]  Daune Perch, [place]

## 2019-04-29 ENCOUNTER — Inpatient Hospital Stay: Payer: Medicare Other | Attending: Hematology | Admitting: Hematology

## 2019-04-29 ENCOUNTER — Other Ambulatory Visit: Payer: Self-pay

## 2019-04-29 ENCOUNTER — Encounter: Payer: Self-pay | Admitting: Hematology

## 2019-04-29 VITALS — BP 145/82 | HR 80 | Temp 98.2°F | Resp 18 | Ht 63.0 in | Wt 189.6 lb

## 2019-04-29 DIAGNOSIS — I739 Peripheral vascular disease, unspecified: Secondary | ICD-10-CM | POA: Insufficient documentation

## 2019-04-29 DIAGNOSIS — Z803 Family history of malignant neoplasm of breast: Secondary | ICD-10-CM | POA: Insufficient documentation

## 2019-04-29 DIAGNOSIS — E1151 Type 2 diabetes mellitus with diabetic peripheral angiopathy without gangrene: Secondary | ICD-10-CM | POA: Insufficient documentation

## 2019-04-29 DIAGNOSIS — I10 Essential (primary) hypertension: Secondary | ICD-10-CM | POA: Diagnosis not present

## 2019-04-29 DIAGNOSIS — Z794 Long term (current) use of insulin: Secondary | ICD-10-CM | POA: Diagnosis not present

## 2019-04-29 DIAGNOSIS — N6092 Unspecified benign mammary dysplasia of left breast: Secondary | ICD-10-CM | POA: Diagnosis not present

## 2019-04-29 DIAGNOSIS — B009 Herpesviral infection, unspecified: Secondary | ICD-10-CM | POA: Insufficient documentation

## 2019-04-29 DIAGNOSIS — Z791 Long term (current) use of non-steroidal anti-inflammatories (NSAID): Secondary | ICD-10-CM | POA: Diagnosis not present

## 2019-04-29 DIAGNOSIS — G8929 Other chronic pain: Secondary | ICD-10-CM | POA: Diagnosis not present

## 2019-04-29 DIAGNOSIS — Z8673 Personal history of transient ischemic attack (TIA), and cerebral infarction without residual deficits: Secondary | ICD-10-CM | POA: Insufficient documentation

## 2019-04-29 DIAGNOSIS — Z7982 Long term (current) use of aspirin: Secondary | ICD-10-CM | POA: Diagnosis not present

## 2019-04-29 DIAGNOSIS — M1990 Unspecified osteoarthritis, unspecified site: Secondary | ICD-10-CM | POA: Insufficient documentation

## 2019-04-29 DIAGNOSIS — E1136 Type 2 diabetes mellitus with diabetic cataract: Secondary | ICD-10-CM | POA: Insufficient documentation

## 2019-04-29 DIAGNOSIS — I2510 Atherosclerotic heart disease of native coronary artery without angina pectoris: Secondary | ICD-10-CM

## 2019-04-29 DIAGNOSIS — E785 Hyperlipidemia, unspecified: Secondary | ICD-10-CM | POA: Diagnosis not present

## 2019-04-29 DIAGNOSIS — M797 Fibromyalgia: Secondary | ICD-10-CM | POA: Insufficient documentation

## 2019-04-29 DIAGNOSIS — Z1231 Encounter for screening mammogram for malignant neoplasm of breast: Secondary | ICD-10-CM

## 2019-04-29 DIAGNOSIS — F329 Major depressive disorder, single episode, unspecified: Secondary | ICD-10-CM | POA: Diagnosis not present

## 2019-04-29 DIAGNOSIS — Z79899 Other long term (current) drug therapy: Secondary | ICD-10-CM | POA: Diagnosis not present

## 2019-04-29 DIAGNOSIS — E118 Type 2 diabetes mellitus with unspecified complications: Secondary | ICD-10-CM | POA: Diagnosis not present

## 2019-04-29 DIAGNOSIS — R011 Cardiac murmur, unspecified: Secondary | ICD-10-CM | POA: Diagnosis not present

## 2019-04-29 DIAGNOSIS — N62 Hypertrophy of breast: Secondary | ICD-10-CM | POA: Insufficient documentation

## 2019-04-29 DIAGNOSIS — M8580 Other specified disorders of bone density and structure, unspecified site: Secondary | ICD-10-CM | POA: Insufficient documentation

## 2019-04-29 DIAGNOSIS — I6523 Occlusion and stenosis of bilateral carotid arteries: Secondary | ICD-10-CM | POA: Diagnosis not present

## 2019-04-29 DIAGNOSIS — M545 Low back pain: Secondary | ICD-10-CM | POA: Insufficient documentation

## 2019-04-29 DIAGNOSIS — K219 Gastro-esophageal reflux disease without esophagitis: Secondary | ICD-10-CM | POA: Diagnosis not present

## 2019-04-30 ENCOUNTER — Ambulatory Visit: Payer: Medicare Other | Admitting: Surgery

## 2019-04-30 ENCOUNTER — Telehealth: Payer: Self-pay | Admitting: Hematology

## 2019-04-30 NOTE — Telephone Encounter (Signed)
Scheduled appt per 10/21 los.  Left a VM of the appt date and time.

## 2019-05-04 ENCOUNTER — Ambulatory Visit
Admission: RE | Admit: 2019-05-04 | Discharge: 2019-05-04 | Disposition: A | Discharge status: Home / Self Care | Payer: Medicare Other | Source: Ambulatory Visit | Attending: Specialist | Admitting: Specialist

## 2019-05-04 ENCOUNTER — Other Ambulatory Visit: Payer: Self-pay

## 2019-05-04 DIAGNOSIS — M4807 Spinal stenosis, lumbosacral region: Secondary | ICD-10-CM

## 2019-05-04 DIAGNOSIS — M5116 Intervertebral disc disorders with radiculopathy, lumbar region: Secondary | ICD-10-CM | POA: Diagnosis not present

## 2019-05-04 DIAGNOSIS — M48061 Spinal stenosis, lumbar region without neurogenic claudication: Secondary | ICD-10-CM | POA: Diagnosis not present

## 2019-05-04 DIAGNOSIS — Z4889 Encounter for other specified surgical aftercare: Secondary | ICD-10-CM

## 2019-05-04 MED ORDER — GADOBENATE DIMEGLUMINE 529 MG/ML IV SOLN
17.0000 mL | Freq: Once | INTRAVENOUS | Status: AC | PRN
  Administered 2019-05-04: 12:00:00 17 mL via INTRAVENOUS

## 2019-05-05 ENCOUNTER — Other Ambulatory Visit: Payer: Medicare Other

## 2019-05-05 DIAGNOSIS — E785 Hyperlipidemia, unspecified: Secondary | ICD-10-CM | POA: Diagnosis not present

## 2019-05-05 DIAGNOSIS — E118 Type 2 diabetes mellitus with unspecified complications: Secondary | ICD-10-CM | POA: Diagnosis not present

## 2019-05-05 DIAGNOSIS — I5032 Chronic diastolic (congestive) heart failure: Secondary | ICD-10-CM

## 2019-05-05 DIAGNOSIS — I779 Disorder of arteries and arterioles, unspecified: Secondary | ICD-10-CM | POA: Diagnosis not present

## 2019-05-05 DIAGNOSIS — I10 Essential (primary) hypertension: Secondary | ICD-10-CM

## 2019-05-05 DIAGNOSIS — I2510 Atherosclerotic heart disease of native coronary artery without angina pectoris: Secondary | ICD-10-CM | POA: Diagnosis not present

## 2019-05-05 LAB — BASIC METABOLIC PANEL
BUN/Creatinine Ratio: 15 (ref 12–28)
BUN: 13 mg/dL (ref 8–27)
CO2: 26 mmol/L (ref 20–29)
Calcium: 9.3 mg/dL (ref 8.7–10.3)
Chloride: 103 mmol/L (ref 96–106)
Creatinine, Ser: 0.89 mg/dL (ref 0.57–1.00)
GFR calc Af Amer: 75 mL/min/{1.73_m2} (ref >59)
GFR calc non Af Amer: 65 mL/min/{1.73_m2} (ref >59)
Glucose: 155 mg/dL — ABNORMAL HIGH (ref 65–99)
Potassium: 3.6 mmol/L (ref 3.5–5.2)
Sodium: 142 mmol/L (ref 134–144)

## 2019-05-06 ENCOUNTER — Ambulatory Visit: Payer: Medicare Other | Admitting: Specialist

## 2019-05-06 ENCOUNTER — Telehealth: Payer: Self-pay

## 2019-05-06 NOTE — Telephone Encounter (Signed)
Patient returned call from earlier and was informed that of her lab results. Patient verbalized understanding.

## 2019-05-07 ENCOUNTER — Encounter: Payer: Self-pay | Admitting: Cardiovascular Disease

## 2019-05-07 ENCOUNTER — Ambulatory Visit (INDEPENDENT_AMBULATORY_CARE_PROVIDER_SITE_OTHER): Payer: Medicare Other | Admitting: Cardiovascular Disease

## 2019-05-07 ENCOUNTER — Other Ambulatory Visit: Payer: Self-pay

## 2019-05-07 VITALS — BP 166/76 | Ht 62.0 in | Wt 192.2 lb

## 2019-05-07 DIAGNOSIS — I10 Essential (primary) hypertension: Secondary | ICD-10-CM

## 2019-05-07 MED ORDER — DOXAZOSIN MESYLATE 4 MG PO TABS: ORAL_TABLET | ORAL | 1 refills | Status: DC

## 2019-05-07 NOTE — Progress Notes (Signed)
Hypertension Clinic Initial Assessment:    Date:  05/07/2019   ID:  [name], DOB [birth year], MRN [id number]  PCP:  Laurey Morale, MD  Cardiologist:  Sherren Mocha, MD  Nephrologist:  Referring MD: Laurey Morale, MD   CC: Hypertension  History of Present Illness:    [name] is a 73 y.o. female with a hx of CAD s/p RCA PCI, mild carotid stenosis, stroke, hypertension, hyperlipidemia, GERD, diabetes, here to establish care in the hypertension clinic.  She had an RCA PCI in 2010.  Repeat heart catheterization in 2016 showed that her stent was patent.  There was 50% LAD stenosis and 60% D1.   She reports having high blood pressure for 20-30 years.  It has been intermittently controlled.  She did cardiac rehab after her PCI and noted that it was better controlled when in cardiac rehab.  It was also better controlled when she was exercising more.  Two years ago she had surgery on her back.  Prior to that she was more active.  Lately she has been unable to walk due to back pain and sciatica.  She has no exertional chest pain or shortness of breath.  She has a difficult time lying down because of her back but has no orthopnea or PND.  She had renal artery Dopplers that were normal bilaterally in 2013.  Her blood pressure has always been labile.  She does not check it regularly at home because she does not have a machine.  She saw Richardson Dopp, PA-C on 04/28/19.  Her BP was poorly controlled.  Spironolactone was increased.  She takes all her morning medicines first thing in the morning when she wakes up.  Hydralazine she takes 3 times daily with her meals.  She has never had problems with blood pressure being too low.  Of note her hemoglobin A1c was recently 15%.  She states that this was higher than she has ever had it before.  She has stopped eating ice cream and other sweets.  She struggles with salt intake and does not like food and less and has a lot of seasoning.  Current BP  Regimen: Hydralazine 100 mg tid laisx 35m daily Irbesartan 300 mg daily Imdur 940mdaily Metoprolol succinate 10080maily Spironolactone 9m60mily  Previous antihypertensives: amlodipine Carvedilol- swelling Clonidine- doesn't remember the side effect   Past Medical History:  Diagnosis Date  . Abnormal nuclear stress test 12/29/2014  . Allergy   . Anemia, unspecified   . Arthritis   . Blood transfusion without reported diagnosis    2018  . CAD (coronary artery disease)    sees. Dr. CoopBurt Knackp cath with PCI of RCA (xience des) June 2010. Pt also with LAD and D2 dzs-NL FFR in both areas med Rx  . Carotid artery occlusion   . Cataract    sees Dr. HeckHerbert Deaner Depressive disorder, not elsewhere classified   . Duodenal nodule   . Edema   . Fibromyalgia   . Gastric polyps    COLON  . GERD (gastroesophageal reflux disease)   . Glaucoma    narrow angle, sees Dr. HeckHerbert Deaner Headache(784.0)   . Hearing loss    left ear  . Heart murmur    questionable  . Hemorrhoids   . HSV (herpes simplex virus) anogenital infection 05/2014  . HTN (hypertension)   . Hyperlipidemia   . Low back pain    she sees Dr. NitkLouanne Skye  also Dr. Ernestina Patches for pain medications and Benjiman Core PA for injections   . Obesity   . Osteopenia 01/2018   T score -1.6 FRAX 7.1% / 1.4%  . Peripheral vascular disease ([place])   . Stroke [facility])    TIA  "many yrs ago"    . TIA (transient ischemic attack)    also Hx of it.   . Type II or unspecified type diabetes mellitus without mention of complication, not stated as uncontrolled    sees Dr. Carrolyn Meiers     Past Surgical History:  Procedure Laterality Date  . ABDOMINAL HYSTERECTOMY  age 39   TAH and USO  Leiomyomata  . ANTERIOR CERVICAL CORPECTOMY N/A 06/11/2014   Procedure: ANTERIOR CERVICAL CORPECTOMY CERVICAL SIX; WITH CERVICAL FIVE TO CERVICAL SEVEN ARTHRODESIS;  Surgeon: Ashok Pall, MD;  Location: [place] [facility];  Service: Neurosurgery;  Laterality: N/A;  .  BACK SURGERY  2008   lumb fusion  . BREAST BIOPSY Left 01/26/2014   Procedure: EXCISION LEFT BREAST MASS;  Surgeon: Adin Hector, MD;  Location: [place];  Service: General;  Laterality: Left;  . BREAST LUMPECTOMY WITH RADIOACTIVE SEED LOCALIZATION Left 07/24/2017   Procedure: LEFT BREAST LUMPECTOMY WITH RADIOACTIVE SEED LOCALIZATION ERAS;  Surgeon: Erroll Luna, MD;  Location: [place];  Service: General;  Laterality: Left;  . CARDIAC CATHETERIZATION    . CARDIAC CATHETERIZATION N/A 12/29/2014   Procedure: Left Heart Cath and Coronary Angiography;  Surgeon: Sherren Mocha, MD;  Location: [place] [facility];  Service: Cardiovascular;  Laterality: N/A;  . CAROTID ENDARTERECTOMY  march 2012   per Dr. Morton Amy  . CARPAL TUNNEL RELEASE Left 05/14/2014   Procedure: Left Carpal tunnel release;  Surgeon: Ashok Pall, MD;  Location: [place] [facility];  Service: Neurosurgery;  Laterality: Left;  Left Carpal tunnel release  . CARPAL TUNNEL RELEASE Bilateral   . CHOLECYSTECTOMY    . COLONOSCOPY  06/26/2017   per Dr. Henrene Pastor, adenomatous polyp, repeat in 5 yrs    . CORONARY STENT PLACEMENT     Dr. Burt Knack  . DILATION AND CURETTAGE OF UTERUS    . ESOPHAGOGASTRODUODENOSCOPY  02-08-06   per Dr. Henrene Pastor, gastritis   . EYE SURGERY Left June 2016   Cataract  . GLAUCOMA SURGERY     with laser, per Dr. Henrene Pastor   . HARDWARE REMOVAL  10/30/2016   Procedure: HARDWARE REMOVAL;  Surgeon: Jessy Oto, MD;  Location: [facility];  Service: Orthopedics;;  . LUMBAR FUSION  2008  . POLYPECTOMY    . POSTERIOR LUMBAR FUSION  10/30/2016   Removal of hardware rods and pedicle screws lumbar four, Extension of fusion L4-5 to L5-S1 with reinsertion of screws at L 4, left L5-S1 transforaminal lumbar interbody fusion, Exploration  left L5 nerve root, bilateral decompressive laminectomy L3-4/notes 10/30/2016  . ULNAR NERVE TRANSPOSITION Left 05/14/2014   Procedure: Left Ulnar nerve decompression;   Surgeon: Ashok Pall, MD;  Location: [place] [facility];  Service: Neurosurgery;  Laterality: Left;  Left Ulnar nerve decompression    Current Medications: Current Meds  Medication Sig  . albuterol (PROVENTIL HFA;VENTOLIN HFA) 108 (90 Base) MCG/ACT inhaler Inhale 2 puffs into the lungs every 4 (four) hours as needed for wheezing or shortness of breath.  Marland Kitchen aspirin 81 MG tablet Take 81 mg by mouth daily.   . clobetasol cream (TEMOVATE) 0.05 % Apply to the outside of the vagina once daily  . diclofenac sodium (VOLTAREN) 1 % GEL Apply  2 g topically 4 (four) times daily as needed (for pain).   Marland Kitchen ergocalciferol (VITAMIN D2) 50000 UNITS capsule Take 50,000 Units by mouth every Saturday. On Saturday.  . furosemide (LASIX) 40 MG tablet TAKE ONE TABLET BY MOUTH ONCE DAILY  . HUMALOG 100 UNIT/ML injection   . hydrALAZINE (APRESOLINE) 100 MG tablet TAKE 1 TABLET BY MOUTH THREE TIMES DAILY  . Insulin Disposable Pump (V-GO 40) KIT   . Insulin Human (INSULIN PUMP) SOLN Inject into the skin. Uses Novolog insulin for pump  . irbesartan (AVAPRO) 300 MG tablet Take 1 tablet (300 mg total) by mouth daily. Please schedule an appt for further refills. 1st attempt  . isosorbide mononitrate (IMDUR) 30 MG 24 hr tablet TAKE 1 TABLET BY MOUTH ONCE DAILY(THIS IS TO BE TAKEN WITH THE 60MG TAB OF IMDUR TO=90MG DAILY)  . isosorbide mononitrate (IMDUR) 60 MG 24 hr tablet Take 1 tablet (60 mg total) by mouth daily. Pt needs to call and make appt for further refills - 1st attempt  . latanoprost (XALATAN) 0.005 % ophthalmic solution Place 1 drop into both eyes at bedtime.   . metoprolol succinate (TOPROL-XL) 100 MG 24 hr tablet TAKE 1 TABLET BY MOUTH ONCE DAILY OR  IMMEDIATELY  FOLLOWING  A  MEAL  . montelukast (SINGULAIR) 10 MG tablet Take 10 mg by mouth daily.  . naproxen (NAPROSYN) 500 MG tablet Take 1 tablet (500 mg total) by mouth 2 (two) times daily with a meal. (Patient taking differently: Take 500 mg by mouth daily as needed  for mild pain. )  . nitroGLYCERIN (NITROSTAT) 0.4 MG SL tablet Place 1 tablet (0.4 mg total) under the tongue every 5 (five) minutes as needed for chest pain.  Marland Kitchen nystatin-triamcinolone (MYCOLOG II) cream Apply 1 application topically 2 (two) times daily.  . pantoprazole (PROTONIX) 40 MG tablet Take 40 mg by mouth daily.   . rosuvastatin (CRESTOR) 40 MG tablet TAKE 1 TABLET BY MOUTH ONCE DAILY  . spironolactone (ALDACTONE) 50 MG tablet Take 1 tablet (50 mg total) by mouth daily.  Marland Kitchen terconazole (TERAZOL 7) 0.4 % vaginal cream Place 1 applicator vaginally at bedtime.  . traMADol (ULTRAM) 50 MG tablet Take 2 tablets (100 mg total) by mouth every 6 (six) hours as needed.     Allergies:   Patient has no known allergies.   Social History   Socioeconomic History  . Marital status: Married    Spouse name: Not on file  . Number of children: 1  . Years of education: 16  . Highest education level: Bachelor's degree (e.g., BA, AB, BS)  Occupational History  . Not on file  Social Needs  . Financial resource strain: Hard  . Food insecurity    Worry: Never true    Inability: Never true  . Transportation needs    Medical: No    Non-medical: No  Tobacco Use  . Smoking status: Former Smoker    Quit date: 01/22/1967    Years since quitting: 52.3  . Smokeless tobacco: Never Used  Substance and Sexual Activity  . Alcohol use: No    Alcohol/week: 0.0 standard drinks  . Drug use: No  . Sexual activity: Never    Birth control/protection: Surgical, Post-menopausal    Comment: HYST-1st intercourse 43 yo-5 partners  Lifestyle  . Physical activity    Days per week: 1 day    Minutes per session: 10 min  . Stress: Not at all  Relationships  . Social connections  Talks on phone: Not on file    Gets together: Not on file    Attends religious service: Not on file    Active member of club or organization: Not on file    Attends meetings of clubs or organizations: Not on file    Relationship  status: Not on file  Other Topics Concern  . Not on file  Social History Narrative  . Not on file     Family History: [name] family history includes Breast cancer (age of onset: 52) in her cousin; Breast cancer (age of onset: 67) in her maternal aunt; Breast cancer (age of onset: 36) in her cousin; Breast cancer (age of onset: 74) in her cousin; Cancer in her brother, father, and maternal aunt; Diabetes in her brother; Esophageal cancer in her cousin; Heart disease in her brother and maternal grandfather; Hyperlipidemia in her sister; Hypertension in her brother, mother, and sister; Prostate cancer (age of onset: 27) in her cousin; Stomach cancer (age of onset: 21) in her maternal grandmother; Varicose Veins in her brother. There is no history of Coronary artery disease, Colon cancer, Colon polyps, or Rectal cancer.  ROS:   Please see the history of present illness.     All other systems reviewed and are negative.  EKGs/Labs/Other Studies Reviewed:    EKG:  EKG is not ordered today.  The ekg ordered 04/28/19 demonstrates sinus bradycardia.  Rate 59 bpm.  LHC 12/29/14 LM: Ostial 30 LAD: prox 50, dist 50; D1 60 LCx: lum irregs RCA: prox 30, mid stent ok Med Rx  Echo 12/30/14 EF 60% to 65%. Wall motion was normal; Grade 2 diastolic dysfunction    Recent Labs: 02/20/2019: ALT 27; Hemoglobin 13.0; Platelets 154.0; TSH 2.60 05/05/2019: BUN 13; Creatinine, Ser 0.89; Potassium 3.6; Sodium 142   Recent Lipid Panel    Component Value Date/Time   CHOL 123 02/20/2019 1022   TRIG 168.0 (H) 02/20/2019 1022   HDL 39.40 02/20/2019 1022   CHOLHDL 3 02/20/2019 1022   VLDL 33.6 02/20/2019 1022   LDLCALC 50 02/20/2019 1022    Physical Exam:    VS:  BP (!) 166/76   Ht _0  (1.575 m)   Wt 192 lb 3.2 oz (87.2 kg)   BMI 35.15 kg/m     Wt Readings from Last 3 Encounters:  05/07/19 192 lb 3.2 oz (87.2 kg)  04/29/19 189 lb 9.6 oz (86 kg)  04/28/19 191 lb 6.4 oz (86.8 kg)      VS:  BP (!) 166/76   Ht _1  (1.575 m)   Wt 192 lb 3.2 oz (87.2 kg)   BMI 35.15 kg/m  , BMI Body mass index is 35.15 kg/m. GENERAL:  Well appearing HEENT: Pupils equal round and reactive, fundi not visualized, oral mucosa unremarkable NECK:  No jugular venous distention, waveform within normal limits, carotid upstroke brisk and symmetric, +R carotid bruits, no thyromegaly LYMPHATICS:  No cervical adenopathy LUNGS:  Clear to auscultation bilaterally HEART:  RRR.  PMI not displaced or sustained,S1 and S2 within normal limits, no S3, no S4, no clicks, no rubs, no murmurs ABD:  Flat, positive bowel sounds normal in frequency in pitch, no bruits, no rebound, no guarding, no midline pulsatile mass, no hepatomegaly, no splenomegaly EXT:  2 plus pulses throughout, no edema, no cyanosis no clubbing SKIN:  No rashes no nodules NEURO:  Cranial nerves II through XII grossly intact, motor grossly intact throughout PSYCH:  Cognitively intact, oriented to person place and  time   ASSESSMENT:    No diagnosis found.  PLAN:    # Essential hypertension: BP was elevated both initially and on repeat.  In fact, on repeat her systolic blood pressure was over 200.  We discussed the fact that it would likely take adjustments to her medication as well as an increase in her exercise regimen to get her blood pressure under better control.  She is also going to work on limiting her salt intake.  She will have a goal of less than 2000 mg of sodium daily.  She was given information on the DASH diet as well as an hypertension booklet with information to read.  She is very excited to start with the prep program.  Her exercise has been limited by back pain.  However she does think she can use an exercise bike and some of the equipment.  She will continue her current antihypertensive regimen of furosemide, hydralazine, irbesartan, isosorbide mononitrate, metoprolol, and spironolactone.  We will also add doxazosin 4 mg  nightly.  She will track her blood pressure and bring it to follow-up with our pharmacist.  # DM: A1c 15.2%.  She is working with her PCP on this and working on her diet.    Disposition:    FU with MD/PharmD in 1 month virtually.  F/u with Dareon Nunziato C. Oval Linsey, MD, [facility] in 3 months.    Medication Adjustments/Labs and Tests Ordered: Current medicines are reviewed at length with the patient today.  Concerns regarding medicines are outlined above.  No orders of the defined types were placed in this encounter.  No orders of the defined types were placed in this encounter.    Signed, Skeet Latch, MD  05/07/2019 2:02 PM    [facility]

## 2019-05-07 NOTE — Patient Instructions (Addendum)
Medication Instructions:  START DOXAZOSIN 4 MG IN THE EVENING   Labwork: NONE   Testing/Procedures: NONE   Follow-Up: Your physician recommends that you schedule a follow-up appointment in: [place] 10:30 ON 06/09/19 WILL GIVE YOU YOUR FOLLOW UP DATE WITH DR Franklinton AT THAT TIME    You will receive a phone call from the PREP exercise and nutrition program to schedule an initial assessment.   Special Instructions:   MONITOR AND LOG YOUR BLOOD PRESSURE AT HOME TWICE A DAY   If you have questions regarding your blood pressure or any medication changes from Monday through Friday, 8 AM through 5 PM please call Vanita Ingles, RN [phone]) or Winifred Olive, RN [phone]).  If you are in need of emergency care after hours, please call 911 or be seen in the emergency room.   Care Guide: Neil Crouch  Mychart or Email: [email or web address]   DASH Eating Plan DASH stands for "Dietary Approaches to Stop Hypertension." The DASH eating plan is a healthy eating plan that has been shown to reduce high blood pressure (hypertension). It may also reduce your risk for type 2 diabetes, heart disease, and stroke. The DASH eating plan may also help with weight loss. What are tips for following this plan?  General guidelines  Avoid eating more than 2,300 mg (milligrams) of salt (sodium) a day. If you have hypertension, you may need to reduce your sodium intake to 1,500 mg a day.  Limit alcohol intake to no more than 1 drink a day for nonpregnant women and 2 drinks a day for men. One drink equals 12 oz of beer, 5 oz of wine, or 1 oz of hard liquor.  Work with your health care provider to maintain a healthy body weight or to lose weight. Ask what an ideal weight is for you.  Get at least 30 minutes of exercise that causes your heart to beat faster (aerobic exercise) most days of the week. Activities may include walking, swimming, or biking.  Work with your health care  provider or diet and nutrition specialist (dietitian) to adjust your eating plan to your individual calorie needs. Reading food labels   Check food labels for the amount of sodium per serving. Choose foods with less than 5 percent of the Daily Value of sodium. Generally, foods with less than 300 mg of sodium per serving fit into this eating plan.  To find whole grains, look for the word "whole" as the first word in the ingredient list. Shopping  Buy products labeled as "low-sodium" or "no salt added."  Buy fresh foods. Avoid canned foods and premade or frozen meals. Cooking  Avoid adding salt when cooking. Use salt-free seasonings or herbs instead of table salt or sea salt. Check with your health care provider or pharmacist before using salt substitutes.  Do not fry foods. Cook foods using healthy methods such as baking, boiling, grilling, and broiling instead.  Cook with heart-healthy oils, such as olive, canola, soybean, or sunflower oil. Meal planning  Eat a balanced diet that includes: ? 5 or more servings of fruits and vegetables each day. At each meal, try to fill half of your plate with fruits and vegetables. ? Up to 6-8 servings of whole grains each day. ? Less than 6 oz of lean meat, poultry, or fish each day. A 3-oz serving of meat is about the same size as a deck of cards. One egg equals 1 oz. ? 2 servings of low-fat dairy each day. ?  A serving of nuts, seeds, or beans 5 times each week. ? Heart-healthy fats. Healthy fats called Omega-3 fatty acids are found in foods such as flaxseeds and coldwater fish, like sardines, salmon, and mackerel.  Limit how much you eat of the following: ? Canned or prepackaged foods. ? Food that is high in trans fat, such as fried foods. ? Food that is high in saturated fat, such as fatty meat. ? Sweets, desserts, sugary drinks, and other foods with added sugar. ? Full-fat dairy products.  Do not salt foods before eating.  Try to eat at  least 2 vegetarian meals each week.  Eat more home-cooked food and less restaurant, buffet, and fast food.  When eating at a restaurant, ask that your food be prepared with less salt or no salt, if possible. What foods are recommended? The items listed may not be a complete list. Talk with your dietitian about what dietary choices are best for you. Grains Whole-grain or whole-wheat bread. Whole-grain or whole-wheat pasta. Brown rice. [place]. Bulgur. Whole-grain and low-sodium cereals. Pita bread. Low-fat, low-sodium crackers. Whole-wheat flour tortillas. Vegetables Fresh or frozen vegetables (raw, steamed, roasted, or grilled). Low-sodium or reduced-sodium tomato and vegetable juice. Low-sodium or reduced-sodium tomato sauce and tomato paste. Low-sodium or reduced-sodium canned vegetables. Fruits All fresh, dried, or frozen fruit. Canned fruit in natural juice (without added sugar). Meat and other protein foods Skinless chicken or [place]. Ground chicken or [place]. Pork with fat trimmed off. Fish and seafood. Egg whites. Dried beans, peas, or lentils. Unsalted nuts, nut butters, and seeds. Unsalted canned beans. Lean cuts of beef with fat trimmed off. Low-sodium, lean deli meat. Dairy Low-fat (1%) or fat-free (skim) milk. Fat-free, low-fat, or reduced-fat cheeses. Nonfat, low-sodium ricotta or cottage cheese. Low-fat or nonfat yogurt. Low-fat, low-sodium cheese. Fats and oils Soft margarine without trans fats. Vegetable oil. Low-fat, reduced-fat, or light mayonnaise and salad dressings (reduced-sodium). Canola, safflower, olive, soybean, and sunflower oils. Avocado. Seasoning and other foods Herbs. Spices. Seasoning mixes without salt. Unsalted popcorn and pretzels. Fat-free sweets. What foods are not recommended? The items listed may not be a complete list. Talk with your dietitian about what dietary choices are best for you. Grains Baked goods made with fat, such as croissants,  muffins, or some breads. Dry pasta or rice meal packs. Vegetables Creamed or fried vegetables. Vegetables in a cheese sauce. Regular canned vegetables (not low-sodium or reduced-sodium). Regular canned tomato sauce and paste (not low-sodium or reduced-sodium). Regular tomato and vegetable juice (not low-sodium or reduced-sodium). Angie Fava. Olives. Fruits Canned fruit in a light or heavy syrup. Fried fruit. Fruit in cream or butter sauce. Meat and other protein foods Fatty cuts of meat. Ribs. Fried meat. Berniece Salines. Sausage. Bologna and other processed lunch meats. Salami. Fatback. Hotdogs. Bratwurst. Salted nuts and seeds. Canned beans with added salt. Canned or smoked fish. Whole eggs or egg yolks. Chicken or [place] with skin. Dairy Whole or 2% milk, cream, and half-and-half. Whole or full-fat cream cheese. Whole-fat or sweetened yogurt. Full-fat cheese. Nondairy creamers. Whipped toppings. Processed cheese and cheese spreads. Fats and oils Butter. Stick margarine. Lard. Shortening. Ghee. Bacon fat. Tropical oils, such as coconut, palm kernel, or palm oil. Seasoning and other foods Salted popcorn and pretzels. Onion salt, garlic salt, seasoned salt, table salt, and sea salt. Worcestershire sauce. Tartar sauce. Barbecue sauce. Teriyaki sauce. Soy sauce, including reduced-sodium. Steak sauce. Canned and packaged gravies. Fish sauce. Oyster sauce. Cocktail sauce. Horseradish that you find on the shelf. Ketchup.  Mustard. Meat flavorings and tenderizers. Bouillon cubes. Hot sauce and Tabasco sauce. Premade or packaged marinades. Premade or packaged taco seasonings. Relishes. Regular salad dressings. Where to find more information:  National Heart, Lung, and [place]: https://wilson-eaton.com/  American Heart Association: www.heart.org Summary  The DASH eating plan is a healthy eating plan that has been shown to reduce high blood pressure (hypertension). It may also reduce your risk for type 2 diabetes,  heart disease, and stroke.  With the DASH eating plan, you should limit salt (sodium) intake to 2,300 mg a day. If you have hypertension, you may need to reduce your sodium intake to 1,500 mg a day.  When on the DASH eating plan, aim to eat more fresh fruits and vegetables, whole grains, lean proteins, low-fat dairy, and heart-healthy fats.  Work with your health care provider or diet and nutrition specialist (dietitian) to adjust your eating plan to your individual calorie needs. This information is not intended to replace advice given to you by your health care provider. Make sure you discuss any questions you have with your health care provider. Document Released: 06/14/2011 Document Revised: 06/07/2017 Document Reviewed: 06/18/2016 Elsevier Patient Education  2020 [employer].

## 2019-05-07 NOTE — Progress Notes (Signed)
Hypertension Clinic Care Guide Assessment:    Date:  05/07/2019   ID:  CARAL WHAN, DOB 06-17-1946, MRN [id number]  PCP:  Laurey Morale, MD  Cardiologist:  Sherren Mocha, MD   Referring MD: Laurey Morale, MD    Patient Identification:   [name] is a 73 y.o. female with a history of HTN here for initial HTN Clinic visit.  History of Present Illness/Progress Notes:   Ms Slight reports her blood pressure fluctuates "up and down". She shared that she had back surgery over 2 years ago and has not had much improvement in mobility. She states that she "loves salt" and would be interested in some new ways to season her food with low sodium options.   Current Medications: Current Meds  Medication Sig  . albuterol (PROVENTIL HFA;VENTOLIN HFA) 108 (90 Base) MCG/ACT inhaler Inhale 2 puffs into the lungs every 4 (four) hours as needed for wheezing or shortness of breath.  Marland Kitchen aspirin 81 MG tablet Take 81 mg by mouth daily.   . clobetasol cream (TEMOVATE) 0.05 % Apply to the outside of the vagina once daily  . diclofenac sodium (VOLTAREN) 1 % GEL Apply 2 g topically 4 (four) times daily as needed (for pain).   Marland Kitchen ergocalciferol (VITAMIN D2) 50000 UNITS capsule Take 50,000 Units by mouth every Saturday. On Saturday.  . furosemide (LASIX) 40 MG tablet TAKE ONE TABLET BY MOUTH ONCE DAILY  . HUMALOG 100 UNIT/ML injection   . hydrALAZINE (APRESOLINE) 100 MG tablet TAKE 1 TABLET BY MOUTH THREE TIMES DAILY  . Insulin Disposable Pump (V-GO 40) KIT   . Insulin Human (INSULIN PUMP) SOLN Inject into the skin. Uses Novolog insulin for pump  . irbesartan (AVAPRO) 300 MG tablet Take 1 tablet (300 mg total) by mouth daily. Please schedule an appt for further refills. 1st attempt  . isosorbide mononitrate (IMDUR) 30 MG 24 hr tablet TAKE 1 TABLET BY MOUTH ONCE DAILY(THIS IS TO BE TAKEN WITH THE 60MG TAB OF IMDUR TO=90MG DAILY)  . isosorbide mononitrate (IMDUR) 60 MG 24 hr tablet Take 1 tablet  (60 mg total) by mouth daily. Pt needs to call and make appt for further refills - 1st attempt  . latanoprost (XALATAN) 0.005 % ophthalmic solution Place 1 drop into both eyes at bedtime.   . metoprolol succinate (TOPROL-XL) 100 MG 24 hr tablet TAKE 1 TABLET BY MOUTH ONCE DAILY OR  IMMEDIATELY  FOLLOWING  A  MEAL  . montelukast (SINGULAIR) 10 MG tablet Take 10 mg by mouth daily.  . naproxen (NAPROSYN) 500 MG tablet Take 1 tablet (500 mg total) by mouth 2 (two) times daily with a meal. (Patient taking differently: Take 500 mg by mouth daily as needed for mild pain. )  . nitroGLYCERIN (NITROSTAT) 0.4 MG SL tablet Place 1 tablet (0.4 mg total) under the tongue every 5 (five) minutes as needed for chest pain.  Marland Kitchen nystatin-triamcinolone (MYCOLOG II) cream Apply 1 application topically 2 (two) times daily.  . pantoprazole (PROTONIX) 40 MG tablet Take 40 mg by mouth daily.   . rosuvastatin (CRESTOR) 40 MG tablet TAKE 1 TABLET BY MOUTH ONCE DAILY  . spironolactone (ALDACTONE) 50 MG tablet Take 1 tablet (50 mg total) by mouth daily.  Marland Kitchen terconazole (TERAZOL 7) 0.4 % vaginal cream Place 1 applicator vaginally at bedtime.  . traMADol (ULTRAM) 50 MG tablet Take 2 tablets (100 mg total) by mouth every 6 (six) hours as needed.     Social  History: Social History   Socioeconomic History  . Marital status: Married    Spouse name: Not on file  . Number of children: 1  . Years of education: 46  . Highest education level: Bachelor's degree (e.g., BA, AB, BS)  Occupational History  . Not on file  Social Needs  . Financial resource strain: Hard  . Food insecurity    Worry: Never true    Inability: Never true  . Transportation needs    Medical: No    Non-medical: No  Tobacco Use  . Smoking status: Former Smoker    Quit date: 01/22/1967    Years since quitting: 52.3  . Smokeless tobacco: Never Used  Substance and Sexual Activity  . Alcohol use: No    Alcohol/week: 0.0 standard drinks  . Drug use: No   . Sexual activity: Never    Birth control/protection: Surgical, Post-menopausal    Comment: HYST-1st intercourse 79 yo-5 partners  Lifestyle  . Physical activity    Days per week: 1 day    Minutes per session: 10 min  . Stress: Not at all  Relationships  . Social Herbalist on phone: Not on file    Gets together: Not on file    Attends religious service: Not on file    Active member of club or organization: Not on file    Attends meetings of clubs or organizations: Not on file    Relationship status: Not on file  Other Topics Concern  . Not on file  Social History Narrative  . Not on file      Challenges:  Patient noted that she has some challenges with financial means obtaining her DM medication and supplies. Patient shared back pain which impedes her ability to exercise.   Opportunities:  Patient is agreeable to the PREP program and increasing her mobility and exploring nutritional opportunities for lower sodium intake.  Patient Commitment/Agreement for Next Session:   Patient would like to set goal of increased mobility to ultimately walk in her neighborhood.  Care Guide/Social Work Interventions  Care Guide will follow up nutritional opportunities for low sodium intake. Social Work available as needed.  Follow up:    FU with Care Guide in 2-3 weeks FU with Social Work  available as needed.    Signed, Melba Coon  05/07/2019 1:59 PM    [facility]

## 2019-05-09 ENCOUNTER — Other Ambulatory Visit: Payer: Self-pay | Admitting: Physician Assistant

## 2019-05-09 DIAGNOSIS — I10 Essential (primary) hypertension: Secondary | ICD-10-CM

## 2019-05-14 ENCOUNTER — Ambulatory Visit (INDEPENDENT_AMBULATORY_CARE_PROVIDER_SITE_OTHER): Payer: Medicare Other | Admitting: Surgery

## 2019-05-14 ENCOUNTER — Encounter: Payer: Self-pay | Admitting: Surgery

## 2019-05-14 ENCOUNTER — Other Ambulatory Visit: Payer: Self-pay

## 2019-05-14 ENCOUNTER — Telehealth: Payer: Self-pay | Admitting: Radiology

## 2019-05-14 DIAGNOSIS — M5126 Other intervertebral disc displacement, lumbar region: Secondary | ICD-10-CM

## 2019-05-14 NOTE — Telephone Encounter (Signed)
Patient needs early morning appointment with Dr. Louanne Skye, MRI review.   Patient was seen by Jeneen Rinks this morning, she needs a surgical consult with Dr. Louanne Skye. Due to Dr. Arvil Persons schedule she could not be worked in this morning. Patient did not want to leave and come back, requesting next week.  Patient states it is ok to leave voicemail with appointment date and time, so she is on schedule. If date and appointment time does not work she will call back. Only able to hear out of one ear, she tends to miss calls. So please leave message.

## 2019-05-14 NOTE — Progress Notes (Signed)
73 year old black female returns for review of lumbar MRI that was performed May 04, 2019.  Dr. Louanne Skye ordered study last office visit with him.  Patient needs to have ongoing pain in the low back with radiation to the left buttock and upper leg.  Also has pain into the right side as well but not as bad as the left.  Scan showed:  CLINICAL DATA:  Lumbar radiculopathy. Right leg pain. History of lumbar fusion  Creatinine was obtained on site at [facility] at [address]  Results: Creatinine 0.8 mg/dL.  EXAM: MRI LUMBAR SPINE WITHOUT AND WITH CONTRAST  TECHNIQUE: Multiplanar and multiecho pulse sequences of the lumbar spine were obtained without and with intravenous contrast.  CONTRAST:  75mL MULTIHANCE GADOBENATE DIMEGLUMINE 529 MG/ML IV SOLN  COMPARISON:  Lumbar MRI 02/13/2016  FINDINGS: Segmentation:  Normal  Alignment:  5 mm anterolisthesis L5-S1 unchanged.  Vertebrae:  Negative for fracture or mass.  Conus medullaris and cauda equina: Conus extends to the L2 level. Conus and cauda equina appear normal.  Paraspinal and other soft tissues: Negative for paraspinous mass or fluid collection.  Disc levels:  L1-2: Mild disc degeneration and spurring on the left. No significant stenosis  L2-3: Moderately large extruded disc fragment in the midline is new since the prior study. This indents the thecal sac is causing moderate to severe spinal stenosis left greater than right. Disc protrusion extends into the left foramen and there is significant left subarticular and foraminal stenosis. There is also significant subarticular and foraminal stenosis on the right.  L3-4: Advanced disc degeneration. Interval laminectomy. Mild spinal stenosis. Moderate subarticular and foraminal stenosis bilaterally due to spurring.  L4-5: Pedicle screw and interbody fusion which appears solid. Posterior laminectomy. No significant spinal or foraminal  stenosis  L5-S1: Pedicle screw and interbody fusion. 5 mm anterolisthesis. Interbody spacer appears unilateral on the left and appears to project posteriorly into the ventral epidural space, unchanged from CT of 04/03/2017. There is moderate to severe subarticular and foraminal stenosis on the left due to spurring and mild subarticular and foraminal stenosis on the right. No definite arthrodesis at this level.  IMPRESSION: Interval development of moderate to large extruded disc fragment at L2-3. This is causing moderate to severe spinal stenosis. Disc protrusion extends into the foramen on the left. There is significant foraminal and subarticular stenosis bilaterally left greater than right  Laminectomy L3-4 with mild spinal stenosis and moderate subarticular stenosis bilaterally  Solid fusion L4-5  PLIF L5-S1 without arthrodesis identified. Interbody spacer on the left projects into the ventral epidural space unchanged from prior studies. Moderate to severe subarticular stenosis on the left.   Electronically Signed   By: Franchot Gallo M.D.   On: 05/04/2019 14:04  Plan I reviewed images with patient today.  Discussed that it may come down to her needing surgical intervention with L2-3 laminectomy/microdiscectomy but this needs to be discussed with Dr. Louanne Skye.  We will see if she can get worked into the schedule this afternoon.  All questions answered.

## 2019-05-15 ENCOUNTER — Telehealth: Payer: Self-pay | Admitting: Specialist

## 2019-05-15 NOTE — Telephone Encounter (Signed)
I called and lmom for her to come in on Monday 05/18/2019 @ 9

## 2019-05-15 NOTE — Telephone Encounter (Signed)
Returned call to patient left message  Advising her appointment is scheduled with Dr Louanne Skye 05/18/2019 at 9:00am

## 2019-05-18 ENCOUNTER — Ambulatory Visit (INDEPENDENT_AMBULATORY_CARE_PROVIDER_SITE_OTHER): Payer: Medicare Other | Admitting: Gynecology

## 2019-05-18 ENCOUNTER — Encounter: Payer: Self-pay | Admitting: Gynecology

## 2019-05-18 ENCOUNTER — Encounter: Payer: Self-pay | Admitting: Specialist

## 2019-05-18 ENCOUNTER — Other Ambulatory Visit: Payer: Self-pay

## 2019-05-18 ENCOUNTER — Ambulatory Visit (INDEPENDENT_AMBULATORY_CARE_PROVIDER_SITE_OTHER): Payer: Medicare Other | Admitting: Specialist

## 2019-05-18 VITALS — BP 124/80

## 2019-05-18 VITALS — BP 124/51 | HR 64 | Ht 62.0 in | Wt 192.0 lb

## 2019-05-18 DIAGNOSIS — M4326 Fusion of spine, lumbar region: Secondary | ICD-10-CM | POA: Diagnosis not present

## 2019-05-18 DIAGNOSIS — M5126 Other intervertebral disc displacement, lumbar region: Secondary | ICD-10-CM | POA: Diagnosis not present

## 2019-05-18 DIAGNOSIS — M5136 Other intervertebral disc degeneration, lumbar region: Secondary | ICD-10-CM | POA: Diagnosis not present

## 2019-05-18 DIAGNOSIS — N764 Abscess of vulva: Secondary | ICD-10-CM | POA: Diagnosis not present

## 2019-05-18 DIAGNOSIS — M48062 Spinal stenosis, lumbar region with neurogenic claudication: Secondary | ICD-10-CM | POA: Diagnosis not present

## 2019-05-18 DIAGNOSIS — I6523 Occlusion and stenosis of bilateral carotid arteries: Secondary | ICD-10-CM | POA: Diagnosis not present

## 2019-05-18 MED ORDER — DOXYCYCLINE HYCLATE 100 MG PO CAPS: 100.0000 mg | ORAL_CAPSULE | Freq: Two times a day (BID) | ORAL | 0 refills | Status: DC

## 2019-05-18 NOTE — Addendum Note (Signed)
Addended by: Nelva Nay on: 05/18/2019 03:39 PM   Modules accepted: Orders

## 2019-05-18 NOTE — Patient Instructions (Signed)
Started on the antibiotics twice daily for 10 days.  Sits baths 2-3 times daily.  Follow-up if this area persists.

## 2019-05-18 NOTE — Progress Notes (Signed)
    [name] 29-Jun-1946 [id number]        73 y.o.  G2P1011 presents with a weeks worth of worsening boil.  She has been using sitz bath's but is finding it difficult to sit and walk.  No fever or chills.  History of vulvar boils/abscesses in the past.  Past medical history,surgical history, problem list, medications, allergies, family history and social history were all reviewed and documented in the EPIC chart.  Directed ROS with pertinent positives and negatives documented in the history of present illness/assessment and plan.  Exam: [name] assistant Vitals:   05/18/19 1440  BP: 124/80   General appearance:  Normal External BUS vagina with fluctuant pointing 2 to 3 cm abscess left lateral mid labia majora.  Surrounding tissue induration.  No significant cellulitis.  No inguinal adenopathy.  Physical Exam  Genitourinary:        Procedure: The skin overlying the most pointing point was cleansed with Betadine and subsequently infiltrated with 1% lidocaine.  A stab incision was made with a scalpel with return of purulent material.  Culture taken.  The cavity was probed with a hemostat to break up any loculations.  The abscess was completely drained.  The patient tolerated well.  Assessment/Plan:  73 y.o. G2P1011 I&D of left labial abscess.  Post drainage instructions given.  She will continue with sitz bath several times daily.  Will start on doxycycline 100 mg twice daily x10 days.  Follow-up if this does not completely resolve.    [name], 3:01 PM 05/18/2019

## 2019-05-18 NOTE — Progress Notes (Signed)
Office Visit Note   Patient: [name]           Date of Birth: [birth year]           MRN: [id number] Visit Date: 05/18/2019              Requested by: Laurey Morale, MD [place],  [place] [address] PCP: Laurey Morale, MD   Assessment & Plan: Visit Diagnoses:  1. Lumbar disc herniation   2. Degenerative disc disease, lumbar   3. Spinal stenosis of lumbar region with neurogenic claudication   4. Fusion of spine of lumbar region     Plan: Avoid bending, stooping and avoid lifting weights greater than 10 lbs. Avoid prolong standing and walking. Order for a new walker with wheels. Surgery scheduling secretary Kandice Hams, will call you in the next week to schedule for surgery.  Surgery recommended is a two level lumbar discectomy left L2-3 and right L3-4 this would be done with the OR microscopeTake hydrocodone for for pain. Risk of surgery includes risk of infection 1 in 200 patients, bleeding 07/998% chance you would need a transfusion.   Risk to the nerves is one in 10,000.  Expect improved walking and standing tolerance. Expect relief of leg pain but numbness may persist depending on the length and degree of pressure that has been present.  Follow-Up Instructions: No follow-ups on file.   Orders:  No orders of the defined types were placed in this encounter.  No orders of the defined types were placed in this encounter.     Procedures: No procedures performed   Clinical Data: Findings:  CLINICAL DATA:  Lumbar radiculopathy. Right leg pain. History of lumbar fusion  Creatinine was obtained on site at [facility] at [address]  Results: Creatinine 0.8 mg/dL.  EXAM: MRI LUMBAR SPINE WITHOUT AND WITH CONTRAST  TECHNIQUE: Multiplanar and multiecho pulse sequences of the lumbar spine were obtained without and with intravenous contrast.  CONTRAST:  72mL MULTIHANCE GADOBENATE DIMEGLUMINE 529 MG/ML IV SOLN   COMPARISON:  Lumbar MRI 02/13/2016  FINDINGS: Segmentation:  Normal  Alignment:  5 mm anterolisthesis L5-S1 unchanged.  Vertebrae:  Negative for fracture or mass.  Conus medullaris and cauda equina: Conus extends to the L2 level. Conus and cauda equina appear normal.  Paraspinal and other soft tissues: Negative for paraspinous mass or fluid collection.  Disc levels:  L1-2: Mild disc degeneration and spurring on the left. No significant stenosis  L2-3: Moderately large extruded disc fragment in the midline is new since the prior study. This indents the thecal sac is causing moderate to severe spinal stenosis left greater than right. Disc protrusion extends into the left foramen and there is significant left subarticular and foraminal stenosis. There is also significant subarticular and foraminal stenosis on the right.  L3-4: Advanced disc degeneration. Interval laminectomy. Mild spinal stenosis. Moderate subarticular and foraminal stenosis bilaterally due to spurring.  L4-5: Pedicle screw and interbody fusion which appears solid. Posterior laminectomy. No significant spinal or foraminal stenosis  L5-S1: Pedicle screw and interbody fusion. 5 mm anterolisthesis. Interbody spacer appears unilateral on the left and appears to project posteriorly into the ventral epidural space, unchanged from CT of 04/03/2017. There is moderate to severe subarticular and foraminal stenosis on the left due to spurring and mild subarticular and foraminal stenosis on the right. No definite arthrodesis at this level.  IMPRESSION: Interval development of moderate to large extruded disc fragment at L2-3.  This is causing moderate to severe spinal stenosis. Disc protrusion extends into the foramen on the left. There is significant foraminal and subarticular stenosis bilaterally left greater than right  Laminectomy L3-4 with mild spinal stenosis and moderate subarticular stenosis  bilaterally  Solid fusion L4-5  PLIF L5-S1 without arthrodesis identified. Interbody spacer on the left projects into the ventral epidural space unchanged from prior studies. Moderate to severe subarticular stenosis on the left.   Electronically Signed   By: Franchot Gallo M.D.   On: 05/04/2019 14:04    Subjective: Chief Complaint  Patient presents with  . Lower Back - Pain    73 year old female with back pain and radiation in to the left buttock and down the left thigh. She previously has had right thigh pain. No bowel or bladder difficulty. She is stooped over and leaning on furniture at home to get around. She is able to grocery shop with the use of the electric cart and with assistance packing in to the car her groceries. She can walk only about 100 feet without assistance and uses a cane. She has no numbness or tingling mainly pain in the left buttock in to the left thigh. Some pain in the back. She feels better sitting and has no pain when she sits. She tends to stoop when standing.    Review of Systems  Constitutional: Negative.   HENT: Negative.   Eyes: Negative.   Respiratory: Negative.   Cardiovascular: Negative.   Gastrointestinal: Negative.   Endocrine: Negative.   Genitourinary: Negative.   Musculoskeletal: Positive for back pain and gait problem.  Skin: Negative.   Allergic/Immunologic: Negative.   Neurological: Positive for weakness. Negative for dizziness, seizures, syncope, facial asymmetry, speech difficulty, light-headedness, numbness and headaches.  Hematological: Negative.   Psychiatric/Behavioral: Negative.  Negative for agitation, behavioral problems, confusion, decreased concentration, dysphoric mood, hallucinations, self-injury, sleep disturbance and suicidal ideas. The patient is not nervous/anxious and is not hyperactive.      Objective: Vital Signs: BP (!) 124/51 (BP Location: Left Arm, Patient Position: Sitting)   Pulse 64   Ht 5\' 2"   (1.575 m)   Wt 192 lb (87.1 kg)   BMI 35.12 kg/m   Physical Exam  Ortho Exam  Specialty Comments:  No specialty comments available.  Imaging: No results found.   PMFS History: Patient Active Problem List   Diagnosis Date Noted  . Anemia due to blood loss 11/02/2016    Priority: High    Class: Acute  . Spondylolisthesis, lumbar region 10/30/2016    Priority: High    Class: Chronic  . Spinal stenosis of lumbar region 10/30/2016    Priority: High    Class: Chronic  . Retained orthopedic hardware 10/30/2016    Priority: High    Class: Chronic  . Hyperlipidemia 07/17/2017  . History of lumbar spinal fusion   . History of fusion of lumbar spine   . Lethargy 10/31/2016  . AKI (acute kidney injury) ([place]) 10/31/2016  . Chronic diastolic CHF (congestive heart failure) ([place]) 10/31/2016  . Cough   . Leukocytosis   . Spinal stenosis of lumbar region with neurogenic claudication 10/30/2016  . Genetic testing 09/14/2015  . Atypical ductal hyperplasia of left breast 08/24/2015  . Family history of breast cancer   . Family history of prostate cancer   . Family history of stomach cancer   . Abnormal nuclear stress test 12/29/2014  . Cervical spondylosis with myelopathy and radiculopathy 06/11/2014  . Carpal tunnel syndrome  05/14/2014  . Aftercare following surgery of the circulatory system, [place] 03/11/2014  . Pain of right lower extremity 03/11/2014  . Atypical lobular hyperplasia of left breast 02/12/2014  . Fibromyalgia 09/21/2013  . Carotid artery disease ([place]) 02/29/2012  . Back abscess 01/22/2012  . Chest pain with moderate risk of acute coronary syndrome 12/09/2009  . Shortness of breath 06/10/2009  . Orthostatic hypotension 01/31/2009  . CAD (coronary artery disease) 12/23/2008  . Normocytic anemia 07/21/2008  . Headache(784.0) 07/21/2008  . History of TIA (transient ischemic attack) 07/21/2008  . Edema 02/27/2008  . Diabetes mellitus with complication ([place])  99991111  . Depression 07/16/2007  . Dyslipidemia 02/21/2007  . Essential hypertension 02/21/2007  . ASTHMA 02/21/2007  . GERD 02/21/2007  . LOW BACK PAIN 02/21/2007   Past Medical History:  Diagnosis Date  . Abnormal nuclear stress test 12/29/2014  . Allergy   . Anemia, unspecified   . Arthritis   . Blood transfusion without reported diagnosis    2018  . CAD (coronary artery disease)    sees. Dr. Burt Knack. s/p cath with PCI of RCA (xience des) June 2010. Pt also with LAD and D2 dzs-NL FFR in both areas med Rx  . Carotid artery occlusion   . Cataract    sees Dr. Herbert Deaner   . Depressive disorder, not elsewhere classified   . Duodenal nodule   . Edema   . Fibromyalgia   . Gastric polyps    COLON  . GERD (gastroesophageal reflux disease)   . Glaucoma    narrow angle, sees Dr. Herbert Deaner   . Headache(784.0)   . Hearing loss    left ear  . Heart murmur    questionable  . Hemorrhoids   . HSV (herpes simplex virus) anogenital infection 05/2014  . HTN (hypertension)   . Hyperlipidemia   . Low back pain    she sees Dr. Louanne Skye, also Dr. Ernestina Patches for pain medications and Benjiman Core PA for injections   . Obesity   . Osteopenia 01/2018   T score -1.6 FRAX 7.1% / 1.4%  . Peripheral vascular disease ([place])   . Stroke [facility])    TIA  "many yrs ago"    . TIA (transient ischemic attack)    also Hx of it.   . Type II or unspecified type diabetes mellitus without mention of complication, not stated as uncontrolled    sees Dr. Carrolyn Meiers     Family History  Problem Relation Age of Onset  . Hypertension Mother   . Hypertension Sister   . Hyperlipidemia Sister   . Hypertension Brother   . Cancer Brother        PROSTATE/LUNG  . Diabetes Brother   . Heart disease Brother        Before age 32 - Bypass  . Varicose Veins Brother   . Cancer Father        Prostate and pancreatic   . Stomach cancer Maternal Grandmother 49  . Heart disease Maternal Grandfather   . Breast cancer Maternal Aunt  5  . Cancer Maternal Aunt        Stomach  . Breast cancer Cousin 85       maternal first cousin  . Prostate cancer Cousin 42  . Breast cancer Cousin 49  . Esophageal cancer Cousin   . Breast cancer Cousin 40       maternal first cousin's daughter  . Coronary artery disease Neg Hx  premature CAD  . Colon cancer Neg Hx   . Colon polyps Neg Hx   . Rectal cancer Neg Hx     Past Surgical History:  Procedure Laterality Date  . ABDOMINAL HYSTERECTOMY  age 89   TAH and USO  Leiomyomata  . ANTERIOR CERVICAL CORPECTOMY N/A 06/11/2014   Procedure: ANTERIOR CERVICAL CORPECTOMY CERVICAL SIX; WITH CERVICAL FIVE TO CERVICAL SEVEN ARTHRODESIS;  Surgeon: Ashok Pall, MD;  Location: [place] [facility];  Service: Neurosurgery;  Laterality: N/A;  . BACK SURGERY  2008   lumb fusion  . BREAST BIOPSY Left 01/26/2014   Procedure: EXCISION LEFT BREAST MASS;  Surgeon: Adin Hector, MD;  Location: [facility];  Service: General;  Laterality: Left;  . BREAST LUMPECTOMY WITH RADIOACTIVE SEED LOCALIZATION Left 07/24/2017   Procedure: LEFT BREAST LUMPECTOMY WITH RADIOACTIVE SEED LOCALIZATION ERAS;  Surgeon: Erroll Luna, MD;  Location: [facility];  Service: General;  Laterality: Left;  . CARDIAC CATHETERIZATION    . CARDIAC CATHETERIZATION N/A 12/29/2014   Procedure: Left Heart Cath and Coronary Angiography;  Surgeon: Sherren Mocha, MD;  Location: [facility] CV LAB;  Service: Cardiovascular;  Laterality: N/A;  . CAROTID ENDARTERECTOMY  march 2012   per Dr. Morton Amy  . CARPAL TUNNEL RELEASE Left 05/14/2014   Procedure: Left Carpal tunnel release;  Surgeon: Ashok Pall, MD;  Location: [facility];  Service: Neurosurgery;  Laterality: Left;  Left Carpal tunnel release  . CARPAL TUNNEL RELEASE Bilateral   . CHOLECYSTECTOMY    . COLONOSCOPY  06/26/2017   per Dr. Henrene Pastor, adenomatous polyp, repeat in 5 yrs    . CORONARY STENT PLACEMENT     Dr. Burt Knack  . DILATION AND  CURETTAGE OF UTERUS    . ESOPHAGOGASTRODUODENOSCOPY  02-08-06   per Dr. Henrene Pastor, gastritis   . EYE SURGERY Left June 2016   Cataract  . GLAUCOMA SURGERY     with laser, per Dr. Henrene Pastor   . HARDWARE REMOVAL  10/30/2016   Procedure: HARDWARE REMOVAL;  Surgeon: Jessy Oto, MD;  Location: [facility];  Service: Orthopedics;;  . LUMBAR FUSION  2008  . POLYPECTOMY    . POSTERIOR LUMBAR FUSION  10/30/2016   Removal of hardware rods and pedicle screws lumbar four, Extension of fusion L4-5 to L5-S1 with reinsertion of screws at L 4, left L5-S1 transforaminal lumbar interbody fusion, Exploration  left L5 nerve root, bilateral decompressive laminectomy L3-4/notes 10/30/2016  . ULNAR NERVE TRANSPOSITION Left 05/14/2014   Procedure: Left Ulnar nerve decompression;  Surgeon: Ashok Pall, MD;  Location: [facility];  Service: Neurosurgery;  Laterality: Left;  Left Ulnar nerve decompression   Social History   Occupational History  . Not on file  Tobacco Use  . Smoking status: Former Smoker    Quit date: 01/22/1967    Years since quitting: 52.3  . Smokeless tobacco: Never Used  Substance and Sexual Activity  . Alcohol use: No    Alcohol/week: 0.0 standard drinks  . Drug use: No  . Sexual activity: Never    Birth control/protection: Surgical, Post-menopausal    Comment: HYST-1st intercourse 53 yo-5 partners

## 2019-05-18 NOTE — Patient Instructions (Signed)
Avoid bending, stooping and avoid lifting weights greater than 10 lbs. Avoid prolong standing and walking. Order for a new walker with wheels. Surgery scheduling secretary Kandice Hams, will call you in the next week to schedule for surgery.  Surgery recommended is a two level lumbar discectomy left L2-3 and right L3-4 this would be done with the OR microscopeTake hydrocodone for for pain. Risk of surgery includes risk of infection 1 in 200 patients, bleeding 07/998% chance you would need a transfusion.   Risk to the nerves is one in 10,000.  Expect improved walking and standing tolerance. Expect relief of leg pain but numbness may persist depending on the length and degree of pressure that has been present.

## 2019-05-19 ENCOUNTER — Other Ambulatory Visit: Payer: Medicare Other

## 2019-05-21 DIAGNOSIS — Z9114 Patient's other noncompliance with medication regimen: Secondary | ICD-10-CM | POA: Diagnosis not present

## 2019-05-21 DIAGNOSIS — E1159 Type 2 diabetes mellitus with other circulatory complications: Secondary | ICD-10-CM | POA: Diagnosis not present

## 2019-05-21 DIAGNOSIS — Z794 Long term (current) use of insulin: Secondary | ICD-10-CM | POA: Diagnosis not present

## 2019-05-21 DIAGNOSIS — I10 Essential (primary) hypertension: Secondary | ICD-10-CM | POA: Diagnosis not present

## 2019-05-21 DIAGNOSIS — I5189 Other ill-defined heart diseases: Secondary | ICD-10-CM | POA: Diagnosis not present

## 2019-05-22 LAB — WOUND CULTURE
MICRO NUMBER:: 1079157
SPECIMEN QUALITY:: ADEQUATE

## 2019-05-26 ENCOUNTER — Telehealth: Payer: Self-pay

## 2019-05-26 NOTE — Telephone Encounter (Signed)
Received referral from [facility]N clinic for PREP. Left message for patient on 11/13 reference next class start on 11/30. No CB as of today will retry. Previously stated she was not interested in exercise.

## 2019-05-27 ENCOUNTER — Telehealth: Payer: Self-pay

## 2019-05-27 NOTE — Telephone Encounter (Signed)
PREP Referral Patient agreeable to beginning PREP 11/30 M/W 1-215pm Intake scheduled for 11/23 at 1130pm

## 2019-06-01 ENCOUNTER — Other Ambulatory Visit: Payer: Self-pay

## 2019-06-03 NOTE — Progress Notes (Signed)
[place] Report   Patient Details  Name: [name] MRN: [id number] Date of Birth: 02-Dec-1945 Age: 73 y.o. PCP: Laurey Morale, MD  Vitals:   06/01/19 1200  BP: 132/60  Pulse: (!) 56  Resp: 16  SpO2: 97%  Weight: 188 lb 3.2 oz (85.4 kg)  Height: 5\' 3"  (1.6 m)     [employer] Eval - 06/03/19 1700      Referral    Referring Provider  --   [facility]    Reason for referral  Hypertension      Measurement   Waist Circumference  43 inches    Hip Circumference  47 inches    Body fat  43 percent      Information for Trainer   Goals  Lose 20 lbs, walk better longer distances , increase strength in back    Current Exercise  none    Orthopedic Concerns  Back surgery, ? needs repeat? Hip discrepancy one leg is longer than the other     Pertinent Medical History  HTN, IDDM2, TIA, hearing loss in L ear    Current Barriers  too far from home     Restrictions/Precautions  Fall risk;Assistive device    Medications that affect exercise  Beta blocker;Medication causing dizziness/drowsiness      Timed Up and Go (TUGS)   Timed Up and Go  High risk >13 seconds      Mobility and Daily Activities   I find it easy to walk up or down two or more flights of stairs.  1    I have no trouble taking out the trash.  1    I do housework such as vacuuming and dusting on my own without difficulty.  4    I can easily lift a gallon of milk (8lbs).  1    I can easily walk a mile.  1    I have no trouble reaching into high cupboards or reaching down to pick up something from the floor.  1    I do not have trouble doing out-door work such as Armed forces logistics/support/administrative officer, raking leaves, or gardening.  1      Mobility and Daily Activities   I feel younger than my age.  2    I feel independent.  3    I feel energetic.  2    I live an active life.   2    I feel strong.  2    I feel healthy.  2    I feel active as other people my age.  2      How fit and strong are you.   Fit and Strong  Total Score  25      Past Medical History:  Diagnosis Date  . Abnormal nuclear stress test 12/29/2014  . Allergy   . Anemia, unspecified   . Arthritis   . Blood transfusion without reported diagnosis    2018  . CAD (coronary artery disease)    sees. Dr. Burt Knack. s/p cath with PCI of RCA (xience des) June 2010. Pt also with LAD and D2 dzs-NL FFR in both areas med Rx  . Carotid artery occlusion   . Cataract    sees Dr. Herbert Deaner   . Depressive disorder, not elsewhere classified   . Duodenal nodule   . Edema   . Fibromyalgia   . Gastric polyps    COLON  . GERD (gastroesophageal reflux disease)   .  Glaucoma    narrow angle, sees Dr. Herbert Deaner   . Headache(784.0)   . Hearing loss    left ear  . Heart murmur    questionable  . Hemorrhoids   . HSV (herpes simplex virus) anogenital infection 05/2014  . HTN (hypertension)   . Hyperlipidemia   . Low back pain    she sees Dr. Louanne Skye, also Dr. Ernestina Patches for pain medications and Benjiman Core PA for injections   . Obesity   . Osteopenia 01/2018   T score -1.6 FRAX 7.1% / 1.4%  . Peripheral vascular disease ([place])   . Stroke [facility])    TIA  "many yrs ago"    . TIA (transient ischemic attack)    also Hx of it.   . Type II or unspecified type diabetes mellitus without mention of complication, not stated as uncontrolled    sees Dr. Carrolyn Meiers    Past Surgical History:  Procedure Laterality Date  . ABDOMINAL HYSTERECTOMY  age 50   TAH and USO  Leiomyomata  . ANTERIOR CERVICAL CORPECTOMY N/A 06/11/2014   Procedure: ANTERIOR CERVICAL CORPECTOMY CERVICAL SIX; WITH CERVICAL FIVE TO CERVICAL SEVEN ARTHRODESIS;  Surgeon: Ashok Pall, MD;  Location: [place] NEUR[facility] ORS;  Service: Neurosurgery;  Laterality: N/A;  . BACK SURGERY  2008   lumb fusion  . BREAST BIOPSY Left 01/26/2014   Procedure: EXCISION LEFT BREAST MASS;  Surgeon: Adin Hector, MD;  Location: [place];  Service: General;  Laterality: Left;  . BREAST LUMPECTOMY WITH  RADIOACTIVE SEED LOCALIZATION Left 07/24/2017   Procedure: LEFT BREAST LUMPECTOMY WITH RADIOACTIVE SEED LOCALIZATION ERAS;  Surgeon: Erroll Luna, MD;  Location: [place];  Service: General;  Laterality: Left;  . CARDIAC CATHETERIZATION    . CARDIAC CATHETERIZATION N/A 12/29/2014   Procedure: Left Heart Cath and Coronary Angiography;  Surgeon: Sherren Mocha, MD;  Location: [place] CV LAB;  Service: Cardiovascular;  Laterality: N/A;  . CAROTID ENDARTERECTOMY  march 2012   per Dr. Morton Amy  . CARPAL TUNNEL RELEASE Left 05/14/2014   Procedure: Left Carpal tunnel release;  Surgeon: Ashok Pall, MD;  Location: [place] [facility];  Service: Neurosurgery;  Laterality: Left;  Left Carpal tunnel release  . CARPAL TUNNEL RELEASE Bilateral   . CHOLECYSTECTOMY    . COLONOSCOPY  06/26/2017   per Dr. Henrene Pastor, adenomatous polyp, repeat in 5 yrs    . CORONARY STENT PLACEMENT     Dr. Burt Knack  . DILATION AND CURETTAGE OF UTERUS    . ESOPHAGOGASTRODUODENOSCOPY  02-08-06   per Dr. Henrene Pastor, gastritis   . EYE SURGERY Left June 2016   Cataract  . GLAUCOMA SURGERY     with laser, per Dr. Henrene Pastor   . HARDWARE REMOVAL  10/30/2016   Procedure: HARDWARE REMOVAL;  Surgeon: Jessy Oto, MD;  Location: [facility];  Service: Orthopedics;;  . LUMBAR FUSION  2008  . POLYPECTOMY    . POSTERIOR LUMBAR FUSION  10/30/2016   Removal of hardware rods and pedicle screws lumbar four, Extension of fusion L4-5 to L5-S1 with reinsertion of screws at L 4, left L5-S1 transforaminal lumbar interbody fusion, Exploration  left L5 nerve root, bilateral decompressive laminectomy L3-4/notes 10/30/2016  . ULNAR NERVE TRANSPOSITION Left 05/14/2014   Procedure: Left Ulnar nerve decompression;  Surgeon: Ashok Pall, MD;  Location: [place] [facility];  Service: Neurosurgery;  Laterality: Left;  Left Ulnar nerve decompression   Social History   Tobacco Use  Smoking Status Former Smoker  .  Quit date: 01/22/1967  . Years since  quitting: 52.3  Smokeless Tobacco Never Used     PREP start data 06/08/2019 Schedule M/W Time 1-215pm  Barrier: May required another back surgery. Pt will keep [place] updated if in fact is scheduled.      Barnett Hatter 06/03/2019, 5:30 PM

## 2019-06-09 ENCOUNTER — Encounter: Payer: Self-pay | Admitting: Pharmacist Clinician (PhC)/ Clinical Pharmacy Specialist

## 2019-06-09 ENCOUNTER — Ambulatory Visit: Payer: Self-pay

## 2019-06-09 NOTE — Progress Notes (Signed)
06/09/2019 [name] 02/26/1946 [id number]   HPI:  [name] is a 73 y.o. female patient of [name], with a PMH below who presents today for hypertension follow up.     Past Medical History: ASCVD S/p RCA PCI in 2010, patent in 2016 with 50% LAD and 60% D1 stenosis  Stroke/TIA "many years ago"  hyperlipidemia (02/2019) TC 123, TG 168, HDL 39.4, LDL 50 (on rosuvastatin 40)  DM2 A1c 11.1- making dietary adjustments, on humalog V-GO,      Blood Pressure Goal:  130/80  Current Medications:   Irbesartan 300 mg  Furosemide 40 mg qd  Hydralazine 100 mg tid  Metoprolol succ 100 mg qd  sprionolactone 50 mg qd  Doxazosin 4 mg qd  Family Hx:  Social Hx:  Diet:  Exercise:  Home BP readings:  Intolerances:   Labs: 04/2019:  Na 142, K 3.6, Glu 155, BUN 13, SCr 0.89  (wt 87.1 = CrCl 79.7)  Wt Readings from Last 3 Encounters:  06/01/19 188 lb 3.2 oz (85.4 kg)  05/18/19 192 lb (87.1 kg)  05/07/19 192 lb 3.2 oz (87.2 kg)   BP Readings from Last 3 Encounters:  06/01/19 132/60  05/18/19 124/80  05/18/19 (!) 124/51   Pulse Readings from Last 3 Encounters:  06/01/19 (!) 56  05/18/19 64  04/29/19 80    Current Outpatient Medications  Medication Sig Dispense Refill  . albuterol (PROVENTIL HFA;VENTOLIN HFA) 108 (90 Base) MCG/ACT inhaler Inhale 2 puffs into the lungs every 4 (four) hours as needed for wheezing or shortness of breath. 1 Inhaler 2  . aspirin 81 MG tablet Take 81 mg by mouth daily.     . clobetasol cream (TEMOVATE) 0.05 % Apply to the outside of the vagina once daily 30 g 1  . diclofenac sodium (VOLTAREN) 1 % GEL Apply 2 g topically 4 (four) times daily as needed (for pain).     Marland Kitchen doxazosin (CARDURA) 4 MG tablet DAILY IN THE EVENING 90 tablet 1  . doxycycline (VIBRAMYCIN) 100 MG capsule Take 1 capsule (100 mg total) by mouth 2 (two) times daily. 20 capsule 0  . ergocalciferol (VITAMIN D2) 50000 UNITS capsule Take 50,000 Units by mouth every  Saturday. On Saturday.    . furosemide (LASIX) 40 MG tablet TAKE ONE TABLET BY MOUTH ONCE DAILY 30 tablet 0  . HUMALOG 100 UNIT/ML injection     . hydrALAZINE (APRESOLINE) 100 MG tablet TAKE 1 TABLET BY MOUTH THREE TIMES DAILY 270 tablet 0  . Insulin Disposable Pump (V-GO 40) KIT   2  . Insulin Human (INSULIN PUMP) SOLN Inject into the skin. Uses Novolog insulin for pump    . irbesartan (AVAPRO) 300 MG tablet Take 1 tablet (300 mg total) by mouth daily. Please schedule an appt for further refills. 1st attempt 90 tablet 0  . isosorbide mononitrate (IMDUR) 60 MG 24 hr tablet Take 1 tablet by mouth once daily 90 tablet 3  . latanoprost (XALATAN) 0.005 % ophthalmic solution Place 1 drop into both eyes at bedtime.     . metoprolol succinate (TOPROL-XL) 100 MG 24 hr tablet TAKE 1 TABLET BY MOUTH ONCE DAILY OR  IMMEDIATELY  FOLLOWING  A  MEAL 90 tablet 3  . montelukast (SINGULAIR) 10 MG tablet Take 10 mg by mouth daily.    . naproxen (NAPROSYN) 500 MG tablet Take 1 tablet (500 mg total) by mouth 2 (two) times daily with a meal. (Patient taking differently: Take  500 mg by mouth daily as needed for mild pain. ) 60 tablet 2  . nitroGLYCERIN (NITROSTAT) 0.4 MG SL tablet Place 1 tablet (0.4 mg total) under the tongue every 5 (five) minutes as needed for chest pain. 25 tablet 1  . nystatin-triamcinolone (MYCOLOG II) cream Apply 1 application topically 2 (two) times daily. 30 g 2  . pantoprazole (PROTONIX) 40 MG tablet Take 40 mg by mouth daily.     . rosuvastatin (CRESTOR) 40 MG tablet TAKE 1 TABLET BY MOUTH ONCE DAILY 90 tablet 3  . spironolactone (ALDACTONE) 50 MG tablet Take 1 tablet (50 mg total) by mouth daily. 90 tablet 3  . traMADol (ULTRAM) 50 MG tablet Take 2 tablets (100 mg total) by mouth every 6 (six) hours as needed. 30 tablet 0   No current facility-administered medications for this visit.     No Known Allergies  Past Medical History:  Diagnosis Date  . Abnormal nuclear stress test  12/29/2014  . Allergy   . Anemia, unspecified   . Arthritis   . Blood transfusion without reported diagnosis    2018  . CAD (coronary artery disease)    sees. Karen. Burt Knack. s/p cath with PCI of RCA (xience des) June 2010. Pt also with LAD and D2 dzs-NL FFR in both areas med Rx  . Carotid artery occlusion   . Cataract    sees Karen. Herbert Deaner   . Depressive disorder, not elsewhere classified   . Duodenal nodule   . Edema   . Fibromyalgia   . Gastric polyps    COLON  . GERD (gastroesophageal reflux disease)   . Glaucoma    narrow angle, sees Karen. Herbert Deaner   . Headache(784.0)   . Hearing loss    left ear  . Heart murmur    questionable  . Hemorrhoids   . HSV (herpes simplex virus) anogenital infection 05/2014  . HTN (hypertension)   . Hyperlipidemia   . Low back pain    she sees Karen. Louanne Skye, also Karen. Ernestina Patches for pain medications and Benjiman Core PA for injections   . Obesity   . Osteopenia 01/2018   T score -1.6 FRAX 7.1% / 1.4%  . Peripheral vascular disease ([place])   . Stroke [facility])    TIA  "many yrs ago"    . TIA (transient ischemic attack)    also Hx of it.   . Type II or unspecified type diabetes mellitus without mention of complication, not stated as uncontrolled    sees Karen. Carrolyn Meiers     There were no vitals taken for this visit.  No problem-specific Assessment & Plan notes found for this encounter.   [name] PharmD CPP [facility] [address] [place],  [address] [phone] This encounter was created in error - please disregard.

## 2019-06-10 NOTE — Progress Notes (Signed)
Call from patient today. Unable to come to PREP class today due to back pain. Will try for Monday but unsure if she will be able to participate until she has surgery. Not scheduled currently.  Asked about next class which would be 07/06/2019.  Will f/u on Monday

## 2019-06-11 ENCOUNTER — Ambulatory Visit (INDEPENDENT_AMBULATORY_CARE_PROVIDER_SITE_OTHER): Payer: Medicare Other | Admitting: Pharmacist Clinician (PhC)/ Clinical Pharmacy Specialist

## 2019-06-11 ENCOUNTER — Other Ambulatory Visit: Payer: Self-pay

## 2019-06-11 DIAGNOSIS — I10 Essential (primary) hypertension: Secondary | ICD-10-CM

## 2019-06-11 NOTE — Progress Notes (Signed)
06/12/2019 [name] Co 1946-03-27 [id number]   HPI:  [name] is a 73 y.o. female patient of [name], with a [place] below who presents today for hypertension clinic evaluation.  Patient is part of our hypertension project.  She was seen by Karen. Oval [name] about a month ago and was found to have a blood pressure of 166/76.  Despite taking multiple medications (see below) she was not reaching BP goals. In addition to her listed medications, Karen. Oval [name] started her on doxazosin 4 mg.  Patient notes she took the medication for 3 days, but had to discontinue as her feet swelled to the point where she couldn't fit into shoes.      Past Medical History: CAD S/p PCI to RCA,  50% LAD, 60% D1; mild carotid stenosis, stroke  Hyperlipidemia 02/2019:  TC 123, TG 168, HDL 39.4, LDL 50 - on rosuvastatin 40  GERD Pantoprazole 40 mg daily  DM2 A1c at 15.2 (02/2019), now down to 11.1 - using V-Go insulin delivery system, seeing S. [place] MD     Blood Pressure Goal:  130/80  Current Medications:  Irbesartan 300 mg qd  Hydralazine 100 mg tid (admits to missing night dose at times"  Metoprolol succ 100 mg qd  spironolactone 50 mg qd   Furosemide 40 mg qd  Imdur 90 mg qd  Family Hx: notes diabetes and heart disease in her brother, hypertension in both parents and brother  Social Hx: no tobacco or alcohol, states drinks 1 (16-20 oz) bottle of diet pepsi per day, as well as iced sweet) tea  Diet: likes to bring in take out food.  Breakfast consists of egg, bacon and toast most days.  Otherwise likes to get meals from [employer] or other area restaurants.  States eats lot of chicken and fish - fish is often fried.  Likes vegetables.  Since her A1c was at 15.2, she has cut out going to [employer] on a weekly basis for strawberry ice cream sundaes.  Although she does admit that when driving back from [facility] program, she had to drive past the [employer], so stopped and had her first sundae in about 2 months    Exercise: has gone to 1 PREP program day so far.  States it's too far from her house and not sure if she'll go back.  Also has back problems and is trying to arrange a third back surgery for early 2021.    Home BP readings: thinks her home cuff (given to her by project) has problems as it will read "error" at times.  Home morning average 162/74 with evening average 183/83.    Intolerances:  Amlodipine and doxazosin both caused edema  Labs: 05/05/2019:  Na 142, K 3.6, Glu 155, BUN 13, SCr 0.89, GFR AA 75  Wt Readings from Last 3 Encounters:  06/11/19 187 lb 4.8 oz (85 kg)  06/01/19 188 lb 3.2 oz (85.4 kg)  05/18/19 192 lb (87.1 kg)   BP Readings from Last 3 Encounters:  06/11/19 (!) 140/58  06/01/19 132/60  05/18/19 124/80   Pulse Readings from Last 3 Encounters:  06/11/19 65  06/01/19 (!) 56  05/18/19 64    Current Outpatient Medications  Medication Sig Dispense Refill  . aspirin 81 MG tablet Take 81 mg by mouth daily.     . clobetasol cream (TEMOVATE) 0.05 % Apply to the outside of the vagina once daily 30 g 1  . diclofenac sodium (VOLTAREN) 1 % GEL  Apply 2 g topically 4 (four) times daily as needed (for pain).     Marland Kitchen doxycycline (VIBRAMYCIN) 100 MG capsule Take 1 capsule (100 mg total) by mouth 2 (two) times daily. 20 capsule 0  . ergocalciferol (VITAMIN D2) 50000 UNITS capsule Take 50,000 Units by mouth every Saturday. On Saturday.    . furosemide (LASIX) 40 MG tablet TAKE ONE TABLET BY MOUTH ONCE DAILY 30 tablet 0  . HUMALOG 100 UNIT/ML injection     . hydrALAZINE (APRESOLINE) 100 MG tablet TAKE 1 TABLET BY MOUTH THREE TIMES DAILY 270 tablet 0  . Insulin Disposable Pump (V-GO 40) KIT   2  . Insulin Human (INSULIN PUMP) SOLN Inject into the skin. Uses Novolog insulin for pump    . irbesartan (AVAPRO) 300 MG tablet Take 1 tablet (300 mg total) by mouth daily. Please schedule an appt for further refills. 1st attempt 90 tablet 0  . isosorbide mononitrate (IMDUR) 60 MG 24 hr  tablet Take 1 tablet by mouth once daily 90 tablet 3  . latanoprost (XALATAN) 0.005 % ophthalmic solution Place 1 drop into both eyes at bedtime.     . metoprolol succinate (TOPROL-XL) 100 MG 24 hr tablet TAKE 1 TABLET BY MOUTH ONCE DAILY OR  IMMEDIATELY  FOLLOWING  A  MEAL 90 tablet 3  . montelukast (SINGULAIR) 10 MG tablet Take 10 mg by mouth daily.    . naproxen (NAPROSYN) 500 MG tablet Take 1 tablet (500 mg total) by mouth 2 (two) times daily with a meal. (Patient taking differently: Take 500 mg by mouth daily as needed for mild pain. ) 60 tablet 2  . nitroGLYCERIN (NITROSTAT) 0.4 MG SL tablet Place 1 tablet (0.4 mg total) under the tongue every 5 (five) minutes as needed for chest pain. 25 tablet 1  . nystatin-triamcinolone (MYCOLOG II) cream Apply 1 application topically 2 (two) times daily. 30 g 2  . pantoprazole (PROTONIX) 40 MG tablet Take 40 mg by mouth daily.     . rosuvastatin (CRESTOR) 40 MG tablet TAKE 1 TABLET BY MOUTH ONCE DAILY 90 tablet 3  . spironolactone (ALDACTONE) 50 MG tablet Take 1 tablet (50 mg total) by mouth daily. 90 tablet 3  . traMADol (ULTRAM) 50 MG tablet Take 2 tablets (100 mg total) by mouth every 6 (six) hours as needed. 30 tablet 0  . albuterol (PROVENTIL HFA;VENTOLIN HFA) 108 (90 Base) MCG/ACT inhaler Inhale 2 puffs into the lungs every 4 (four) hours as needed for wheezing or shortness of breath. (Patient not taking: Reported on 06/11/2019) 1 Inhaler 2  . doxazosin (CARDURA) 4 MG tablet DAILY IN THE EVENING (Patient not taking: Reported on 06/11/2019) 90 tablet 1   No current facility-administered medications for this visit.     No Known Allergies  Past Medical History:  Diagnosis Date  . Abnormal nuclear stress test 12/29/2014  . Allergy   . Anemia, unspecified   . Arthritis   . Blood transfusion without reported diagnosis    2018  . CAD (coronary artery disease)    sees. Karen. Burt Knack. s/p cath with PCI of RCA (xience des) June 2010. Pt also with LAD and  D2 dzs-NL FFR in both areas med Rx  . Carotid artery occlusion   . Cataract    sees Karen. Herbert Deaner   . Depressive disorder, not elsewhere classified   . Duodenal nodule   . Edema   . Fibromyalgia   . Gastric polyps    COLON  . GERD (gastroesophageal  reflux disease)   . Glaucoma    narrow angle, sees Karen. Herbert Deaner   . Headache(784.0)   . Hearing loss    left ear  . Heart murmur    questionable  . Hemorrhoids   . HSV (herpes simplex virus) anogenital infection 05/2014  . HTN (hypertension)   . Hyperlipidemia   . Low back pain    she sees Karen. Louanne Skye, also Karen. Ernestina Patches for pain medications and Benjiman Core PA for injections   . Obesity   . Osteopenia 01/2018   T score -1.6 FRAX 7.1% / 1.4%  . Peripheral vascular disease ([place])   . Stroke [facility])    TIA  "many yrs ago"    . TIA (transient ischemic attack)    also Hx of it.   . Type II or unspecified type diabetes mellitus without mention of complication, not stated as uncontrolled    sees Karen. Carrolyn Meiers     Blood pressure (!) 140/58, pulse 65, resp. rate 15, height _0  (1.575 m), weight 187 lb 4.8 oz (85 kg), SpO2 98 %.  Essential hypertension Patient with uncontrolled hypertension and some issues with compliance of single afternoon and evening medications (hydralazine).  We had a long discussion on the need for compliance versus taking more medications.  She currently takes all her medications in the morning, then one dose of hydralazine at noon and in the evenings.  We discussed her dinner and night time routines.  It was finally decided that she will take her metoprolol and irbesartan each night at 11 pm with the last hydralazine dose.  She will get another 7 day pill minder and move these medications to 11. This time was chosen as she always likes to watch the late night talk shows.  It was also suggested that she set an alarm on her smart phone for that time as another reminder.  She was asked to continue with home BP monitoring and  return in one month for follow up.  She should bring her home cuff into the office at that time so we can determine if there are issues with it.  Patient states that she does not like to use computers and would prefer to come in person.  Will schedule accordingly.     Tommy Medal PharmD CPP [facility] [address] [place], [place] [address] [phone]

## 2019-06-11 NOTE — Patient Instructions (Signed)
Return for a a follow up appointment in 1 month  Your blood pressure today is 142/60  Check your blood pressure at home daily and keep record of the readings.  Take your BP meds as follows:  AM:  Hydralazine 100 mg, furosemide 40 mg, isosorbide mono 90 mg, spironolactone 50 mg  NOON:  Hydralazine 100 mg  11 PM:  Hydralazine 100 mg, irbesartan 300 mg, metoprolol 100 mg  Bring all of your meds, your BP cuff and your record of home blood pressures to your next appointment.  Exercise as you're able, try to walk approximately 30 minutes per day.  Keep salt intake to a minimum, especially watch canned and prepared boxed foods.  Eat more fresh fruits and vegetables and fewer canned items.  Avoid eating in fast food restaurants.    HOW TO TAKE YOUR BLOOD PRESSURE: . Rest 5 minutes before taking your blood pressure. .  Don't smoke or drink caffeinated beverages for at least 30 minutes before. . Take your blood pressure before (not after) you eat. . Sit comfortably with your back supported and both feet on the floor (don't cross your legs). . Elevate your arm to heart level on a table or a desk. . Use the proper sized cuff. It should fit smoothly and snugly around your bare upper arm. There should be enough room to slip a fingertip under the cuff. The bottom edge of the cuff should be 1 inch above the crease of the elbow. . Ideally, take 3 measurements at one sitting and record the average.

## 2019-06-12 ENCOUNTER — Encounter: Payer: Self-pay | Admitting: Pharmacist Clinician (PhC)/ Clinical Pharmacy Specialist

## 2019-06-12 NOTE — Assessment & Plan Note (Addendum)
Patient with uncontrolled hypertension and some issues with compliance of single afternoon and evening medications (hydralazine).  We had a long discussion on the need for compliance versus taking more medications.  She currently takes all her medications in the morning, then one dose of hydralazine at noon and in the evenings.  We discussed her dinner and night time routines.  It was finally decided that she will take her metoprolol and irbesartan each night at 11 pm with the last hydralazine dose.  She will get another 7 day pill minder and move these medications to 11. This time was chosen as she always likes to watch the late night talk shows.  It was also suggested that she set an alarm on her smart phone for that time as another reminder.  She was asked to continue with home BP monitoring and return in one month for follow up.  She should bring her home cuff into the office at that time so we can determine if there are issues with it.  Patient states that she does not like to use computers and would prefer to come in person.  Will schedule accordingly.

## 2019-06-15 ENCOUNTER — Other Ambulatory Visit: Payer: Self-pay

## 2019-06-15 NOTE — Progress Notes (Signed)
Received call from patient reference continued back pain. Doesn't feel she is able to participate until after her back surgery. Surgery date is delayed to non-healing wound. She will call when she is ready to participate when she is recovered enough from surgery.

## 2019-06-16 ENCOUNTER — Encounter: Payer: Self-pay | Admitting: Gynecology

## 2019-06-16 ENCOUNTER — Ambulatory Visit (INDEPENDENT_AMBULATORY_CARE_PROVIDER_SITE_OTHER): Payer: Medicare Other | Admitting: Gynecology

## 2019-06-16 ENCOUNTER — Other Ambulatory Visit: Payer: Self-pay | Admitting: Cardiovascular Disease

## 2019-06-16 VITALS — BP 124/82

## 2019-06-16 DIAGNOSIS — I6523 Occlusion and stenosis of bilateral carotid arteries: Secondary | ICD-10-CM

## 2019-06-16 DIAGNOSIS — L723 Sebaceous cyst: Secondary | ICD-10-CM

## 2019-06-16 MED ORDER — SULFAMETHOXAZOLE-TRIMETHOPRIM 800-160 MG PO TABS: 1.0000 | ORAL_TABLET | Freq: Two times a day (BID) | ORAL | 0 refills | Status: DC

## 2019-06-16 NOTE — Patient Instructions (Signed)
Take the antibiotic twice daily for 10 days.  Follow-up with the scheduled general surgery appointment as arranged.

## 2019-06-16 NOTE — Progress Notes (Signed)
    [name] 11-22-1945 [id number]        73 y.o.  G2P1011 with history of left vulvar abscess that was drained last month.  Treated with 1 week of doxycycline.  Notes this area has not completely resolved but continues to drain on and off.  Also noted a separate area on the right that had a little bit of drainage several days ago.  Past medical history,surgical history, problem list, medications, allergies, family history and social history were all reviewed and documented in the EPIC chart.  Directed ROS with pertinent positives and negatives documented in the history of present illness/assessment and plan.  Exam: [name] assistant Vitals:   06/16/19 1158  BP: 124/82   General appearance:  Normal Pelvic external BUS vagina with draining 2 cm fluctuant area upper outer left labia majora as diagrammed.  A firmer but fluctuant 3 cm irregular area crease of right groin as diagrammed.  No drainage from this area or pointing.  No overlying cellulitis  Physical Exam  Genitourinary:      Procedure: The skin overlying the left fluctuant draining area was cleansed with Betadine and using an 18-gauge needle the most pointing draining area was lanced and the area was completely drained of sebaceous material.  Scant bleeding afterwards.  Assessment/Plan:  73 y.o. G2P1011 with draining sebaceous cyst left upper outer labia emptied completely.  Larger fluctuant area right groin nondraining.  Am going to cover her with Septra DS 1 p.o. twice daily x10 days.  Recommend general surgical follow-up as if this area needs to be opened and drained prefer general surgeon given its location in the groin.  She will follow-up with them for ongoing management.    [name], 12:55 PM 06/16/2019

## 2019-06-17 ENCOUNTER — Telehealth: Payer: Self-pay | Admitting: *Deleted

## 2019-06-17 NOTE — Telephone Encounter (Signed)
Patient scheduled on 06/25/19 with Dr.Puja Gosai patient declined sooner appointment.

## 2019-06-17 NOTE — Telephone Encounter (Signed)
-----   Message from Anastasio Auerbach, MD sent at 06/16/2019  1:01 PM EST ----- Schedule appointment with general surgery reference right groin fluctuant area may need I&D.

## 2019-06-17 NOTE — Telephone Encounter (Signed)
Referral placed at [facility] they will call to schedule.

## 2019-06-19 ENCOUNTER — Other Ambulatory Visit: Payer: Self-pay | Admitting: Cardiovascular Disease

## 2019-06-25 ENCOUNTER — Ambulatory Visit (INDEPENDENT_AMBULATORY_CARE_PROVIDER_SITE_OTHER): Payer: Medicare Other

## 2019-06-25 ENCOUNTER — Other Ambulatory Visit: Payer: Self-pay

## 2019-06-25 ENCOUNTER — Encounter: Payer: Self-pay | Admitting: Specialist

## 2019-06-25 ENCOUNTER — Ambulatory Visit (INDEPENDENT_AMBULATORY_CARE_PROVIDER_SITE_OTHER): Payer: Medicare Other | Admitting: Specialist

## 2019-06-25 VITALS — BP 138/71 | HR 74 | Ht 62.0 in | Wt 188.0 lb

## 2019-06-25 DIAGNOSIS — M546 Pain in thoracic spine: Secondary | ICD-10-CM | POA: Diagnosis not present

## 2019-06-25 DIAGNOSIS — I6523 Occlusion and stenosis of bilateral carotid arteries: Secondary | ICD-10-CM

## 2019-06-25 DIAGNOSIS — H6121 Impacted cerumen, right ear: Secondary | ICD-10-CM | POA: Diagnosis not present

## 2019-06-25 DIAGNOSIS — M25511 Pain in right shoulder: Secondary | ICD-10-CM

## 2019-06-25 DIAGNOSIS — M5414 Radiculopathy, thoracic region: Secondary | ICD-10-CM | POA: Diagnosis not present

## 2019-06-25 MED ORDER — TRAMADOL HCL 50 MG PO TABS: 50.0000 mg | ORAL_TABLET | Freq: Four times a day (QID) | ORAL | 0 refills | Status: AC | PRN

## 2019-06-25 MED ORDER — GABAPENTIN 300 MG PO CAPS: 300.0000 mg | ORAL_CAPSULE | Freq: Every day | ORAL | 1 refills | Status: DC

## 2019-06-25 NOTE — Patient Instructions (Signed)
Avoid overhead lifting and overhead use of the arms. Do not lift greater than 5 lbs. Adjust head rest in vehicle to prevent hyperextension if rear ended. Take extra precautions to avoid falling. Scapula retraction exercises. MRI of the thoracic spine to assess for right upper or mid thoracic nerve compression. Start gabapentin for nerve pain.

## 2019-06-25 NOTE — Progress Notes (Signed)
Office Visit Note   Patient: [name]           Date of Birth: August 03, 1945           MRN: [id number] Visit Date: 06/25/2019              Requested by: Laurey Morale, MD [place],  [place] [id number] PCP: Laurey Morale, MD   Assessment & Plan: Visit Diagnoses:  1. Pain in thoracic spine   2. Thoracic radiculopathy   3. Acute pain of right shoulder     Plan: Avoid overhead lifting and overhead use of the arms. Do not lift greater than 5 lbs. Adjust head rest in vehicle to prevent hyperextension if rear ended. Take extra precautions to avoid falling. Scapula retraction exercises. MRI of the thoracic spine to assess for right upper or mid thoracic nerve compression. Start gabapentin for nerve pain.  Follow-Up Instructions: Return in about 3 weeks (around 07/16/2019).   Orders:  Orders Placed This Encounter  Procedures  . MR Thoracic Spine w/o contrast  . XR Thoracic Spine 2 View  . XR Scapula Right   Meds ordered this encounter  Medications  . gabapentin (NEURONTIN) 300 MG capsule    Sig: Take 1 capsule (300 mg total) by mouth at bedtime.    Dispense:  30 capsule    Refill:  1  . traMADol (ULTRAM) 50 MG tablet    Sig: Take 1 tablet (50 mg total) by mouth every 6 (six) hours as needed for up to 7 days.    Dispense:  40 tablet    Refill:  0      Procedures: No procedures performed   Clinical Data: No additional findings.   Subjective: Chief Complaint  Patient presents with  . Middle Back - Pain    73 year old right hand dominant with right subscapula pain that is now radiation into the right right lateral and anterior rib cage under the right breast. No masses or lumps in the right axilla or right breast noted. Dr. Colbert Ewing performed a right breast lumpectomy one year ago to remove a benign tumor in the right breast.  She has also recently began having loss of hearing right ear  3 days ago. She reports she lost her left ear hearing  and is to see Dr. Warren Danes today at 1:30PM For evaluation of the right ear.     Review of Systems  Constitutional: Negative.   HENT: Negative.   Eyes: Negative.   Respiratory: Negative.   Cardiovascular: Negative.   Gastrointestinal: Negative.   Endocrine: Negative.   Genitourinary: Negative.   Musculoskeletal: Negative.   Skin: Negative.   Allergic/Immunologic: Negative.   Neurological: Negative.   Hematological: Negative.   Psychiatric/Behavioral: Negative.      Objective: Vital Signs: BP 138/71 (BP Location: Left Arm, Patient Position: Sitting, Cuff Size: Normal)   Pulse 74   Ht 5\' 2"  (1.575 m)   Wt 188 lb (85.3 kg)   BMI 34.39 kg/m   Physical Exam Constitutional:      Appearance: She is well-developed.  HENT:     Head: Normocephalic and atraumatic.  Eyes:     Pupils: Pupils are equal, round, and reactive to light.  Pulmonary:     Effort: Pulmonary effort is normal.     Breath sounds: Normal breath sounds.  Abdominal:     General: Bowel sounds are normal.     Palpations: Abdomen is soft.  Musculoskeletal:  General: Normal range of motion.     Cervical back: Normal range of motion and neck supple.  Skin:    General: Skin is warm and dry.  Neurological:     Mental Status: She is alert and oriented to person, place, and time.  Psychiatric:        Behavior: Behavior normal.        Thought Content: Thought content normal.        Judgment: Judgment normal.     Back Exam   Tenderness  The patient is experiencing tenderness in the cervical and thoracic.  Range of Motion  Extension: normal  Flexion: normal  Lateral bend right: normal  Lateral bend left: normal  Rotation right: normal  Rotation left: normal   Muscle Strength  Right Quadriceps:  5/5  Left Quadriceps:  5/5  Right Hamstrings:  5/5  Left Hamstrings:  5/5       Specialty Comments:  No specialty comments available.  Imaging: XR Thoracic Spine 2 View  Result Date:  06/25/2019 AP and lateral thoracic spine shows increased upper thoracic kyphosis with anterior spur or osteophyte formation over the anterior disc spaces D4 through D8. These findings are consistent with [place] disease or DISH.   XR Scapula Right  Result Date: 06/25/2019 Right scapula AP and tangential outlet show minimal prominence of the upper margin of the right scapula and its approximation of the chest wall in mildly narrowed but no definite impingement. No mass no acute fracture or bone change noted.    PMFS History: Patient Active Problem List   Diagnosis Date Noted  . Anemia due to blood loss 11/02/2016    Priority: High    Class: Acute  . Spondylolisthesis, lumbar region 10/30/2016    Priority: High    Class: Chronic  . Spinal stenosis of lumbar region 10/30/2016    Priority: High    Class: Chronic  . Retained orthopedic hardware 10/30/2016    Priority: High    Class: Chronic  . Hyperlipidemia 07/17/2017  . History of lumbar spinal fusion   . History of fusion of lumbar spine   . Lethargy 10/31/2016  . AKI (acute kidney injury) ([place]) 10/31/2016  . Chronic diastolic CHF (congestive heart failure) ([place]) 10/31/2016  . Cough   . Leukocytosis   . Spinal stenosis of lumbar region with neurogenic claudication 10/30/2016  . Genetic testing 09/14/2015  . Atypical ductal hyperplasia of left breast 08/24/2015  . Family history of breast cancer   . Family history of prostate cancer   . Family history of stomach cancer   . Abnormal nuclear stress test 12/29/2014  . Cervical spondylosis with myelopathy and radiculopathy 06/11/2014  . Carpal tunnel syndrome 05/14/2014  . Aftercare following surgery of the circulatory system, [facility] 03/11/2014  . Pain of right lower extremity 03/11/2014  . Atypical lobular hyperplasia of left breast 02/12/2014  . Fibromyalgia 09/21/2013  . Carotid artery disease ([place]) 02/29/2012  . Back abscess 01/22/2012  . Chest pain with moderate risk of  acute coronary syndrome 12/09/2009  . Shortness of breath 06/10/2009  . Orthostatic hypotension 01/31/2009  . CAD (coronary artery disease) 12/23/2008  . Normocytic anemia 07/21/2008  . Headache(784.0) 07/21/2008  . History of TIA (transient ischemic attack) 07/21/2008  . Edema 02/27/2008  . Diabetes mellitus with complication ([facility]) 99991111  . Depression 07/16/2007  . Dyslipidemia 02/21/2007  . Essential hypertension 02/21/2007  . ASTHMA 02/21/2007  . GERD 02/21/2007  . LOW BACK PAIN 02/21/2007   Past Medical  History:  Diagnosis Date  . Abnormal nuclear stress test 12/29/2014  . Allergy   . Anemia, unspecified   . Arthritis   . Blood transfusion without reported diagnosis    2018  . CAD (coronary artery disease)    sees. Dr. Burt Knack. s/p cath with PCI of RCA (xience des) June 2010. Pt also with LAD and D2 dzs-NL FFR in both areas med Rx  . Carotid artery occlusion   . Cataract    sees Dr. Herbert Deaner   . Depressive disorder, not elsewhere classified   . Duodenal nodule   . Edema   . Fibromyalgia   . Gastric polyps    COLON  . GERD (gastroesophageal reflux disease)   . Glaucoma    narrow angle, sees Dr. Herbert Deaner   . Headache(784.0)   . Hearing loss    left ear  . Heart murmur    questionable  . Hemorrhoids   . HSV (herpes simplex virus) anogenital infection 05/2014  . HTN (hypertension)   . Hyperlipidemia   . Low back pain    she sees Dr. Louanne Skye, also Dr. Ernestina Patches for pain medications and Benjiman Core PA for injections   . Obesity   . Osteopenia 01/2018   T score -1.6 FRAX 7.1% / 1.4%  . Peripheral vascular disease ([place])   . Stroke [facility])    TIA  "many yrs ago"    . TIA (transient ischemic attack)    also Hx of it.   . Type II or unspecified type diabetes mellitus without mention of complication, not stated as uncontrolled    sees Dr. Carrolyn Meiers     Family History  Problem Relation Age of Onset  . Hypertension Mother   . Hypertension Sister   . Hyperlipidemia  Sister   . Hypertension Brother   . Cancer Brother        PROSTATE/LUNG  . Diabetes Brother   . Heart disease Brother        Before age 61 - Bypass  . Varicose Veins Brother   . Cancer Father        Prostate and pancreatic   . Stomach cancer Maternal Grandmother 77  . Heart disease Maternal Grandfather   . Breast cancer Maternal Aunt 68  . Cancer Maternal Aunt        Stomach  . Breast cancer Cousin 36       maternal first cousin  . Prostate cancer Cousin 30  . Breast cancer Cousin 38  . Esophageal cancer Cousin   . Breast cancer Cousin 40       maternal first cousin's daughter  . Coronary artery disease Neg Hx        premature CAD  . Colon cancer Neg Hx   . Colon polyps Neg Hx   . Rectal cancer Neg Hx     Past Surgical History:  Procedure Laterality Date  . ABDOMINAL HYSTERECTOMY  age 54   TAH and USO  Leiomyomata  . ANTERIOR CERVICAL CORPECTOMY N/A 06/11/2014   Procedure: ANTERIOR CERVICAL CORPECTOMY CERVICAL SIX; WITH CERVICAL FIVE TO CERVICAL SEVEN ARTHRODESIS;  Surgeon: Ashok Pall, MD;  Location: [facility];  Service: Neurosurgery;  Laterality: N/A;  . BACK SURGERY  2008   lumb fusion  . BREAST BIOPSY Left 01/26/2014   Procedure: EXCISION LEFT BREAST MASS;  Surgeon: Adin Hector, MD;  Location: [facility];  Service: General;  Laterality: Left;  . BREAST LUMPECTOMY WITH RADIOACTIVE SEED LOCALIZATION Left 07/24/2017  Procedure: LEFT BREAST LUMPECTOMY WITH RADIOACTIVE SEED LOCALIZATION ERAS;  Surgeon: Erroll Luna, MD;  Location: [facility];  Service: General;  Laterality: Left;  . CARDIAC CATHETERIZATION    . CARDIAC CATHETERIZATION N/A 12/29/2014   Procedure: Left Heart Cath and Coronary Angiography;  Surgeon: Sherren Mocha, MD;  Location: [facility];  Service: Cardiovascular;  Laterality: N/A;  . CAROTID ENDARTERECTOMY  march 2012   per Dr. Morton Amy  . CARPAL TUNNEL RELEASE Left 05/14/2014   Procedure: Left  Carpal tunnel release;  Surgeon: Ashok Pall, MD;  Location: [facility];  Service: Neurosurgery;  Laterality: Left;  Left Carpal tunnel release  . CARPAL TUNNEL RELEASE Bilateral   . CHOLECYSTECTOMY    . COLONOSCOPY  06/26/2017   per Dr. Henrene Pastor, adenomatous polyp, repeat in 5 yrs    . CORONARY STENT PLACEMENT     Dr. Burt Knack  . DILATION AND CURETTAGE OF UTERUS    . ESOPHAGOGASTRODUODENOSCOPY  02-08-06   per Dr. Henrene Pastor, gastritis   . EYE SURGERY Left June 2016   Cataract  . GLAUCOMA SURGERY     with laser, per Dr. Henrene Pastor   . HARDWARE REMOVAL  10/30/2016   Procedure: HARDWARE REMOVAL;  Surgeon: Jessy Oto, MD;  Location: [facility];  Service: Orthopedics;;  . LUMBAR FUSION  2008  . POLYPECTOMY    . POSTERIOR LUMBAR FUSION  10/30/2016   Removal of hardware rods and pedicle screws lumbar four, Extension of fusion L4-5 to L5-S1 with reinsertion of screws at L 4, left L5-S1 transforaminal lumbar interbody fusion, Exploration  left L5 nerve root, bilateral decompressive laminectomy L3-4/notes 10/30/2016  . ULNAR NERVE TRANSPOSITION Left 05/14/2014   Procedure: Left Ulnar nerve decompression;  Surgeon: Ashok Pall, MD;  Location: [facility];  Service: Neurosurgery;  Laterality: Left;  Left Ulnar nerve decompression   Social History   Occupational History  . Not on file  Tobacco Use  . Smoking status: Former Smoker    Quit date: 01/22/1967    Years since quitting: 52.4  . Smokeless tobacco: Never Used  Substance and Sexual Activity  . Alcohol use: No    Alcohol/week: 0.0 standard drinks  . Drug use: No  . Sexual activity: Never    Birth control/protection: Surgical, Post-menopausal    Comment: HYST-1st intercourse 84 yo-5 partners

## 2019-07-01 ENCOUNTER — Other Ambulatory Visit: Payer: Self-pay

## 2019-07-01 ENCOUNTER — Ambulatory Visit (INDEPENDENT_AMBULATORY_CARE_PROVIDER_SITE_OTHER): Payer: Medicare Other | Admitting: Family Medicine

## 2019-07-01 ENCOUNTER — Encounter: Payer: Self-pay | Admitting: Family Medicine

## 2019-07-01 VITALS — BP 160/60 | HR 60 | Temp 97.6°F | Wt 181.4 lb

## 2019-07-01 DIAGNOSIS — M546 Pain in thoracic spine: Secondary | ICD-10-CM | POA: Diagnosis not present

## 2019-07-01 DIAGNOSIS — I6523 Occlusion and stenosis of bilateral carotid arteries: Secondary | ICD-10-CM

## 2019-07-01 MED ORDER — METHYLPREDNISOLONE ACETATE 80 MG/ML IJ SUSP
80.0000 mg | Freq: Once | INTRAMUSCULAR | Status: AC
  Administered 2019-07-01: 80 mg via INTRAMUSCULAR

## 2019-07-01 MED ORDER — METHYLPREDNISOLONE ACETATE 40 MG/ML IJ SUSP
40.0000 mg | Freq: Once | INTRAMUSCULAR | Status: AC
  Administered 2019-07-01: 10:00:00 40 mg via INTRAMUSCULAR

## 2019-07-01 NOTE — Progress Notes (Signed)
   Subjective:    Patient ID: [name], female    DOB: 10-31-1945, 73 y.o.   MRN: [id number]  HPI Here for a sharp pain in the right middle back that radiates around the right side under the shoulder blade and over to the right breast. No recent trauma. She saw Dr. Louanne Skye on 06-25-19, and I reviewed these records today.  He obtained Xrays of the right scapula and the thoracic spine. The scapula was normal. The thoracic spine showed increased kyphosis with numerous bone spurs from T4 to T8 consistent with DISH. He ordered an MRI of the thoracic spine and this is scheduled for 07-29-19. He gave her Gabapentin to take at bedtime along with Tramadol. This helps a little. No SOB.    Review of Systems  Constitutional: Negative.   Respiratory: Negative.   Cardiovascular: Positive for chest pain. Negative for palpitations and leg swelling.  Musculoskeletal: Positive for back pain.       Objective:   Physical Exam Constitutional:      Comments: In mild pain, walks with a cane   Cardiovascular:     Rate and Rhythm: Normal rate and regular rhythm.     Pulses: Normal pulses.     Heart sounds: Normal heart sounds.  Pulmonary:     Effort: Pulmonary effort is normal.     Breath sounds: Normal breath sounds.  Musculoskeletal:     Comments: She is tender just to the right of the mid portion of the thoracic spine. ROM is reduced.   Neurological:     General: No focal deficit present.     Mental Status: She is alert and oriented to person, place, and time.           Assessment & Plan:  She has thoracic pain with radiculopathy, likely from impingement by a spur. She will get the MRI as above. She is given a shot of DepoMedrol today and I advised her to start taking Naproxyn BID. Recheck as needed.  Alysia Penna, MD

## 2019-07-07 LAB — HM DIABETES EYE EXAM

## 2019-07-16 ENCOUNTER — Ambulatory Visit: Payer: Medicare Other

## 2019-07-22 DIAGNOSIS — H9042 Sensorineural hearing loss, unilateral, left ear, with unrestricted hearing on the contralateral side: Secondary | ICD-10-CM | POA: Diagnosis not present

## 2019-07-22 DIAGNOSIS — H9120 Sudden idiopathic hearing loss, unspecified ear: Secondary | ICD-10-CM | POA: Diagnosis not present

## 2019-07-22 DIAGNOSIS — H903 Sensorineural hearing loss, bilateral: Secondary | ICD-10-CM | POA: Diagnosis not present

## 2019-07-24 ENCOUNTER — Ambulatory Visit: Payer: Medicare Other | Admitting: Specialist

## 2019-07-29 ENCOUNTER — Ambulatory Visit
Admission: RE | Admit: 2019-07-29 | Discharge: 2019-07-29 | Disposition: A | Discharge status: Home / Self Care | Payer: Medicare Other | Source: Ambulatory Visit | Attending: Specialist | Admitting: Specialist

## 2019-07-29 ENCOUNTER — Ambulatory Visit: Payer: Self-pay | Admitting: Family Medicine

## 2019-07-29 ENCOUNTER — Other Ambulatory Visit: Payer: Self-pay

## 2019-07-29 DIAGNOSIS — M546 Pain in thoracic spine: Secondary | ICD-10-CM

## 2019-07-29 DIAGNOSIS — M5414 Radiculopathy, thoracic region: Secondary | ICD-10-CM

## 2019-07-29 NOTE — Telephone Encounter (Signed)
Husband tested positive for covid and the wife is concerned.  She wants to know what to do. He has a productive cough and some fatigue. She has been cleaning. She is not having symptoms and advised to get tested at a [facility] unit. Her Internet is not working to schedule. She voiced understanding  Reason for Disposition . General information question, no triage required and triager able to answer question  Protocols used: [place]

## 2019-07-30 ENCOUNTER — Ambulatory Visit (INDEPENDENT_AMBULATORY_CARE_PROVIDER_SITE_OTHER): Payer: Medicare Other | Admitting: Specialist

## 2019-07-30 ENCOUNTER — Other Ambulatory Visit: Payer: Self-pay

## 2019-07-30 ENCOUNTER — Encounter: Payer: Self-pay | Admitting: Specialist

## 2019-07-30 VITALS — BP 146/64 | HR 50 | Ht 64.0 in | Wt 180.0 lb

## 2019-07-30 DIAGNOSIS — M48062 Spinal stenosis, lumbar region with neurogenic claudication: Secondary | ICD-10-CM

## 2019-07-30 DIAGNOSIS — M5126 Other intervertebral disc displacement, lumbar region: Secondary | ICD-10-CM

## 2019-07-30 DIAGNOSIS — M4326 Fusion of spine, lumbar region: Secondary | ICD-10-CM | POA: Diagnosis not present

## 2019-07-30 DIAGNOSIS — M5136 Other intervertebral disc degeneration, lumbar region: Secondary | ICD-10-CM | POA: Diagnosis not present

## 2019-07-30 NOTE — Telephone Encounter (Signed)
Tell her to stay away from her husband as much as possible. Do not eat or sleep together. She should go by an urgent care to be tested

## 2019-07-30 NOTE — Patient Instructions (Signed)
Plan: Avoid bending, stooping and avoid lifting weights greater than 10 lbs. Avoid prolong standing and walking. Avoid frequent bending and stooping  No lifting greater than 10 lbs. May use ice or moist heat for pain. Weight loss is of benefit. Handicap license is approved. Dr. Newton's secretary/Assistant will call to arrange for epidural steroid injection  

## 2019-07-30 NOTE — Telephone Encounter (Signed)
ATC, unable to leave a voicemail. CRM created.

## 2019-07-30 NOTE — Progress Notes (Signed)
Office Visit Note   Patient: [name]           Date of Birth: [birth year]           MRN: [id number] Visit Date: 07/30/2019              Requested by: Laurey Morale, MD [place],  [place] [id number] PCP: Laurey Morale, MD   Assessment & Plan: Visit Diagnoses:  1. Fusion of lumbar spine   2. HNP (herniated nucleus pulposus), lumbar   3. Spinal stenosis of lumbar region with neurogenic claudication   4. Degenerative disc disease, lumbar     Plan: Avoid bending, stooping and avoid lifting weights greater than 10 lbs. Avoid prolong standing and walking. Avoid frequent bending and stooping  No lifting greater than 10 lbs. May use ice or moist heat for pain. Weight loss is of benefit. Handicap license is approved. Dr. Romona Curls secretary/Assistant will call to arrange for epidural steroid injection   Follow-Up Instructions: Return in about 4 weeks (around 08/27/2019).   Orders:  No orders of the defined types were placed in this encounter.  No orders of the defined types were placed in this encounter.     Procedures: No procedures performed   Clinical Data: No additional findings.   Subjective: Chief Complaint  Patient presents with  . Middle Back - Pain, Follow-up    MRI Tspine Review    74 year old female with history of L3-4 to 5-S1 fusion now with increasing neurogenic claudication symptoms likely secondary to  DDD, disc herniation and stenosis of the right L3 and L2 neuroforamen. She has  Previous fusion done 10/2016. She had right pain deep to the scapula on the right side. She underwent MRI of thoracic spine and this has shown changes at the T6-T8 right     Review of Systems  Constitutional: Negative.   HENT: Negative.   Eyes: Negative.   Respiratory: Negative.   Cardiovascular: Negative.   Gastrointestinal: Negative.   Endocrine: Negative.   Genitourinary: Negative.   Musculoskeletal: Positive for back pain and gait  problem. Negative for neck pain and neck stiffness.  Skin: Negative.   Neurological: Positive for weakness and numbness. Negative for dizziness, tremors, seizures, syncope, facial asymmetry, speech difficulty, light-headedness and headaches.  Hematological: Negative.   Psychiatric/Behavioral: Negative.  Negative for agitation, behavioral problems, confusion, decreased concentration, dysphoric mood, hallucinations, self-injury, sleep disturbance and suicidal ideas. The patient is not nervous/anxious and is not hyperactive.      Objective: Vital Signs: BP (!) 146/64 (BP Location: Left Arm, Patient Position: Sitting)   Pulse (!) 50   Ht 5\' 4"  (1.626 m)   Wt 180 lb (81.6 kg)   BMI 30.90 kg/m   Physical Exam Constitutional:      Appearance: She is well-developed.  HENT:     Head: Normocephalic and atraumatic.  Eyes:     Pupils: Pupils are equal, round, and reactive to light.  Pulmonary:     Effort: Pulmonary effort is normal.     Breath sounds: Normal breath sounds.  Abdominal:     General: Bowel sounds are normal.     Palpations: Abdomen is soft.  Musculoskeletal:     Cervical back: Normal range of motion and neck supple.     Lumbar back: Positive right straight leg raise test and positive left straight leg raise test.  Skin:    General: Skin is warm and dry.  Neurological:  Mental Status: She is alert and oriented to person, place, and time.  Psychiatric:        Behavior: Behavior normal.        Thought Content: Thought content normal.        Judgment: Judgment normal.     Back Exam   Tenderness  The patient is experiencing tenderness in the lumbar.  Range of Motion  Extension: abnormal  Flexion: abnormal  Lateral bend right: abnormal  Lateral bend left: abnormal  Rotation right: abnormal  Rotation left: abnormal   Muscle Strength  Right Quadriceps:  5/5  Left Quadriceps:  5/5  Right Hamstrings:  5/5  Left Hamstrings:  5/5   Tests  Straight leg raise  right: positive Straight leg raise left: positive  Reflexes  Patellar: 0/4 Achilles: 0/4  Other  Toe walk: normal Heel walk: normal Erythema: no back redness Scars: present  Comments:  Avoid frequent bending and stooping  No lifting greater than 10 lbs. May use ice or moist heat for pain. Weight loss is of benefit. Best medication for lumbar disc disease is arthritis medications like motrin, celebrex and naprosyn. Exercise is important to improve your indurance and does allow people to function better inspite of back pain.  Avoid bending, stooping and avoid lifting weights greater than 10 lbs. Avoid prolong standing and walking. Avoid frequent bending and stooping  No lifting greater than 10 lbs. May use ice or moist heat for pain. Weight loss is of benefit. Handicap license is approved. Dr. Romona Curls secretary/Assistant will call to arrange for epidural steroid injection       Specialty Comments:  No specialty comments available.  Imaging: No results found.   PMFS History: Patient Active Problem List   Diagnosis Date Noted  . Anemia due to blood loss 11/02/2016    Priority: High    Class: Acute  . Spondylolisthesis, lumbar region 10/30/2016    Priority: High    Class: Chronic  . Spinal stenosis of lumbar region 10/30/2016    Priority: High    Class: Chronic  . Retained orthopedic hardware 10/30/2016    Priority: High    Class: Chronic  . Right-sided thoracic back pain 07/01/2019  . Hyperlipidemia 07/17/2017  . History of lumbar spinal fusion   . History of fusion of lumbar spine   . Lethargy 10/31/2016  . AKI (acute kidney injury) ([place]) 10/31/2016  . Chronic diastolic CHF (congestive heart failure) ([place]) 10/31/2016  . Cough   . Leukocytosis   . Spinal stenosis of lumbar region with neurogenic claudication 10/30/2016  . Genetic testing 09/14/2015  . Atypical ductal hyperplasia of left breast 08/24/2015  . Family history of breast cancer   . Family  history of prostate cancer   . Family history of stomach cancer   . Abnormal nuclear stress test 12/29/2014  . Cervical spondylosis with myelopathy and radiculopathy 06/11/2014  . Carpal tunnel syndrome 05/14/2014  . Aftercare following surgery of the circulatory system, [place] 03/11/2014  . Pain of right lower extremity 03/11/2014  . Atypical lobular hyperplasia of left breast 02/12/2014  . Fibromyalgia 09/21/2013  . Carotid artery disease ([place]) 02/29/2012  . Back abscess 01/22/2012  . Chest pain with moderate risk of acute coronary syndrome 12/09/2009  . Shortness of breath 06/10/2009  . Orthostatic hypotension 01/31/2009  . CAD (coronary artery disease) 12/23/2008  . Normocytic anemia 07/21/2008  . Headache(784.0) 07/21/2008  . History of TIA (transient ischemic attack) 07/21/2008  . Edema 02/27/2008  . Diabetes mellitus with  complication ([place]) 99991111  . Depression 07/16/2007  . Dyslipidemia 02/21/2007  . Essential hypertension 02/21/2007  . ASTHMA 02/21/2007  . GERD 02/21/2007  . LOW BACK PAIN 02/21/2007   Past Medical History:  Diagnosis Date  . Abnormal nuclear stress test 12/29/2014  . Allergy   . Anemia, unspecified   . Arthritis   . Blood transfusion without reported diagnosis    2018  . CAD (coronary artery disease)    sees. Dr. Burt Knack. s/p cath with PCI of RCA (xience des) June 2010. Pt also with LAD and D2 dzs-NL FFR in both areas med Rx  . Carotid artery occlusion   . Cataract    sees Dr. Herbert Deaner   . Depressive disorder, not elsewhere classified   . Duodenal nodule   . Edema   . Fibromyalgia   . Gastric polyps    COLON  . GERD (gastroesophageal reflux disease)   . Glaucoma    narrow angle, sees Dr. Herbert Deaner   . Headache(784.0)   . Hearing loss    left ear  . Heart murmur    questionable  . Hemorrhoids   . HSV (herpes simplex virus) anogenital infection 05/2014  . HTN (hypertension)   . Hyperlipidemia   . Low back pain    she sees Dr. Louanne Skye, also  Dr. Ernestina Patches for pain medications and Benjiman Core PA for injections   . Obesity   . Osteopenia 01/2018   T score -1.6 FRAX 7.1% / 1.4%  . Peripheral vascular disease ([place])   . Stroke [facility])    TIA  "many yrs ago"    . TIA (transient ischemic attack)    also Hx of it.   . Type II or unspecified type diabetes mellitus without mention of complication, not stated as uncontrolled    sees Dr. Carrolyn Meiers     Family History  Problem Relation Age of Onset  . Hypertension Mother   . Hypertension Sister   . Hyperlipidemia Sister   . Hypertension Brother   . Cancer Brother        PROSTATE/LUNG  . Diabetes Brother   . Heart disease Brother        Before age 36 - Bypass  . Varicose Veins Brother   . Cancer Father        Prostate and pancreatic   . Stomach cancer Maternal Grandmother 80  . Heart disease Maternal Grandfather   . Breast cancer Maternal Aunt 1  . Cancer Maternal Aunt        Stomach  . Breast cancer Cousin 2       maternal first cousin  . Prostate cancer Cousin 11  . Breast cancer Cousin 21  . Esophageal cancer Cousin   . Breast cancer Cousin 40       maternal first cousin's daughter  . Coronary artery disease Neg Hx        premature CAD  . Colon cancer Neg Hx   . Colon polyps Neg Hx   . Rectal cancer Neg Hx     Past Surgical History:  Procedure Laterality Date  . ABDOMINAL HYSTERECTOMY  age 18   TAH and USO  Leiomyomata  . ANTERIOR CERVICAL CORPECTOMY N/A 06/11/2014   Procedure: ANTERIOR CERVICAL CORPECTOMY CERVICAL SIX; WITH CERVICAL FIVE TO CERVICAL SEVEN ARTHRODESIS;  Surgeon: Ashok Pall, MD;  Location: [facility];  Service: Neurosurgery;  Laterality: N/A;  . BACK SURGERY  2008   lumb fusion  . BREAST BIOPSY Left 01/26/2014  Procedure: EXCISION LEFT BREAST MASS;  Surgeon: Adin Hector, MD;  Location: [facility];  Service: General;  Laterality: Left;  . BREAST LUMPECTOMY WITH RADIOACTIVE SEED LOCALIZATION Left 07/24/2017   Procedure: LEFT  BREAST LUMPECTOMY WITH RADIOACTIVE SEED LOCALIZATION ERAS;  Surgeon: Erroll Luna, MD;  Location: [facility];  Service: General;  Laterality: Left;  . CARDIAC CATHETERIZATION    . CARDIAC CATHETERIZATION N/A 12/29/2014   Procedure: Left Heart Cath and Coronary Angiography;  Surgeon: Sherren Mocha, MD;  Location: [facility];  Service: Cardiovascular;  Laterality: N/A;  . CAROTID ENDARTERECTOMY  march 2012   per Dr. Morton Amy  . CARPAL TUNNEL RELEASE Left 05/14/2014   Procedure: Left Carpal tunnel release;  Surgeon: Ashok Pall, MD;  Location: [facility];  Service: Neurosurgery;  Laterality: Left;  Left Carpal tunnel release  . CARPAL TUNNEL RELEASE Bilateral   . CHOLECYSTECTOMY    . COLONOSCOPY  06/26/2017   per Dr. Henrene Pastor, adenomatous polyp, repeat in 5 yrs    . CORONARY STENT PLACEMENT     Dr. Burt Knack  . DILATION AND CURETTAGE OF UTERUS    . ESOPHAGOGASTRODUODENOSCOPY  02-08-06   per Dr. Henrene Pastor, gastritis   . EYE SURGERY Left June 2016   Cataract  . GLAUCOMA SURGERY     with laser, per Dr. Henrene Pastor   . HARDWARE REMOVAL  10/30/2016   Procedure: HARDWARE REMOVAL;  Surgeon: Jessy Oto, MD;  Location: [facility];  Service: Orthopedics;;  . LUMBAR FUSION  2008  . POLYPECTOMY    . POSTERIOR LUMBAR FUSION  10/30/2016   Removal of hardware rods and pedicle screws lumbar four, Extension of fusion L4-5 to L5-S1 with reinsertion of screws at L 4, left L5-S1 transforaminal lumbar interbody fusion, Exploration  left L5 nerve root, bilateral decompressive laminectomy L3-4/notes 10/30/2016  . ULNAR NERVE TRANSPOSITION Left 05/14/2014   Procedure: Left Ulnar nerve decompression;  Surgeon: Ashok Pall, MD;  Location: [facility];  Service: Neurosurgery;  Laterality: Left;  Left Ulnar nerve decompression   Social History   Occupational History  . Not on file  Tobacco Use  . Smoking status: Former Smoker    Quit date: 01/22/1967    Years since quitting: 52.5  . Smokeless  tobacco: Never Used  Substance and Sexual Activity  . Alcohol use: No    Alcohol/week: 0.0 standard drinks  . Drug use: No  . Sexual activity: Never    Birth control/protection: Surgical, Post-menopausal    Comment: HYST-1st intercourse 38 yo-5 partners

## 2019-08-03 NOTE — Telephone Encounter (Signed)
Tried calling patient to follow-up and give advice per Dr. Sarajane Jews. Left message to return call to office.

## 2019-08-07 NOTE — Telephone Encounter (Signed)
ATC 

## 2019-08-10 NOTE — Telephone Encounter (Signed)
Unable to reach the patient. Message will be closed.  

## 2019-08-11 ENCOUNTER — Other Ambulatory Visit: Payer: Self-pay | Admitting: Physician Assistant

## 2019-08-13 ENCOUNTER — Other Ambulatory Visit: Payer: Self-pay

## 2019-08-13 ENCOUNTER — Ambulatory Visit: Payer: Self-pay

## 2019-08-13 ENCOUNTER — Ambulatory Visit (INDEPENDENT_AMBULATORY_CARE_PROVIDER_SITE_OTHER): Payer: Medicare Other | Admitting: Physical Medicine and Rehabilitation

## 2019-08-13 ENCOUNTER — Encounter: Payer: Self-pay | Admitting: Physical Medicine and Rehabilitation

## 2019-08-13 VITALS — BP 196/86 | HR 52

## 2019-08-13 DIAGNOSIS — M961 Postlaminectomy syndrome, not elsewhere classified: Secondary | ICD-10-CM

## 2019-08-13 DIAGNOSIS — M5416 Radiculopathy, lumbar region: Secondary | ICD-10-CM

## 2019-08-13 DIAGNOSIS — M5116 Intervertebral disc disorders with radiculopathy, lumbar region: Secondary | ICD-10-CM

## 2019-08-13 MED ORDER — DEXAMETHASONE SODIUM PHOSPHATE 10 MG/ML IJ SOLN
15.0000 mg | Freq: Once | INTRAMUSCULAR | Status: AC
  Administered 2019-08-13: 12:00:00 15 mg

## 2019-08-13 NOTE — Progress Notes (Signed)
JAKITA WIRE - 74 y.o. female MRN [id number]  Date of birth: Feb 13, 1946  Office Visit Note: Visit Date: 08/13/2019 PCP: Laurey Morale, MD Referred by: Laurey Morale, MD  Subjective: Chief Complaint  Patient presents with  . Lower Back - Pain   HPI: CHASITTY GUILBERT is a 74 y.o. female who comes in today At the request of Dr. Basil Dess for diagnostic and for therapeutic right L1 and L2 transforaminal epidural steroid injection.  Patient however comes in today with really bilateral pain that can go from one side to the other with no side predominant.  She is getting back and upper hip pain.  She has prior lumbar fusion of L4-5 and L5-S1.  Prior laminectomy at L3-4.  More recent MRI shows disc extrusion at L2-3 with narrowing on both sides.  We are going to complete bilateral L2 transforaminal injection and she will follow up with Dr. Louanne Skye.  The patient has failed conservative care including home exercise, medications, time and activity modification.  This injection will be diagnostic and hopefully therapeutic.  Please see requesting physician notes for further details and justification.   ROS Otherwise per HPI.  Assessment & Plan: Visit Diagnoses:  1. Radiculopathy, lumbar region   2. Radiculopathy due to lumbar intervertebral disc disorder   3. Post laminectomy syndrome     Plan: No additional findings.   Meds & Orders:  Meds ordered this encounter  Medications  . dexamethasone (DECADRON) injection 15 mg    Orders Placed This Encounter  Procedures  . XR C-ARM NO REPORT  . Epidural Steroid injection    Follow-up: Return in about 2 weeks (around 08/27/2019) for Basil Dess, M.D..   Procedures: No procedures performed  Lumbosacral Transforaminal Epidural Steroid Injection - Sub-Pedicular Approach with Fluoroscopic Guidance  Patient: ADINE MACEK      Date of Birth: May 12, 1946 MRN: [id number] PCP: Laurey Morale, MD      Visit Date: 08/13/2019   Universal  Protocol:    Date/Time: 08/13/2019  Consent Given By: the patient  Position: PRONE  Additional Comments: Vital signs were monitored before and after the procedure. Patient was prepped and draped in the usual sterile fashion. The correct patient, procedure, and site was verified.   Injection Procedure Details:  Procedure Site One Meds Administered:  Meds ordered this encounter  Medications  . dexamethasone (DECADRON) injection 15 mg    Laterality: Bilateral  Location/Site:  L2-L3  Needle size: 22 G  Needle type: Spinal  Needle Placement: Transforaminal  Findings:    -Comments: Excellent flow of contrast along the nerve and into the epidural space.  Right-sided injection showed the foramen to be fairly stenotic.  Procedure Details: After squaring off the end-plates to get a true AP view, the C-arm was positioned so that an oblique view of the foramen as noted above was visualized. The target area is just inferior to the "nose of the scotty dog" or sub pedicular. The soft tissues overlying this structure were infiltrated with 2-3 ml. of 1% Lidocaine without Epinephrine.  The spinal needle was inserted toward the target using a "trajectory" view along the fluoroscope beam.  Under AP and lateral visualization, the needle was advanced so it did not puncture dura and was located close the 6 O'Clock position of the pedical in AP tracterory. Biplanar projections were used to confirm position. Aspiration was confirmed to be negative for CSF and/or blood. A 1-2 ml. volume of Isovue-250 was injected and flow of  contrast was noted at each level. Radiographs were obtained for documentation purposes.   After attaining the desired flow of contrast documented above, a 0.5 to 1.0 ml test dose of 0.25% Marcaine was injected into each respective transforaminal space.  The patient was observed for 90 seconds post injection.  After no sensory deficits were reported, and normal lower extremity  motor function was noted,   the above injectate was administered so that equal amounts of the injectate were placed at each foramen (level) into the transforaminal epidural space.   Additional Comments:  The patient tolerated the procedure well Dressing: 2 x 2 sterile gauze and Band-Aid    Post-procedure details: Patient was observed during the procedure. Post-procedure instructions were reviewed.  Patient left the clinic in stable condition.      Clinical History: MRI LUMBAR SPINE WITHOUT AND WITH CONTRAST  TECHNIQUE: Multiplanar and multiecho pulse sequences of the lumbar spine were obtained without and with intravenous contrast.  CONTRAST:  66mL MULTIHANCE GADOBENATE DIMEGLUMINE 529 MG/ML IV SOLN  COMPARISON:  Lumbar MRI 02/13/2016  FINDINGS: Segmentation:  Normal  Alignment:  5 mm anterolisthesis L5-S1 unchanged.  Vertebrae:  Negative for fracture or mass.  Conus medullaris and cauda equina: Conus extends to the L2 level. Conus and cauda equina appear normal.  Paraspinal and other soft tissues: Negative for paraspinous mass or fluid collection.  Disc levels:  L1-2: Mild disc degeneration and spurring on the left. No significant stenosis  L2-3: Moderately large extruded disc fragment in the midline is new since the prior study. This indents the thecal sac is causing moderate to severe spinal stenosis left greater than right. Disc protrusion extends into the left foramen and there is significant left subarticular and foraminal stenosis. There is also significant subarticular and foraminal stenosis on the right.  L3-4: Advanced disc degeneration. Interval laminectomy. Mild spinal stenosis. Moderate subarticular and foraminal stenosis bilaterally due to spurring.  L4-5: Pedicle screw and interbody fusion which appears solid. Posterior laminectomy. No significant spinal or foraminal stenosis  L5-S1: Pedicle screw and interbody fusion. 5 mm  anterolisthesis. Interbody spacer appears unilateral on the left and appears to project posteriorly into the ventral epidural space, unchanged from CT of 04/03/2017. There is moderate to severe subarticular and foraminal stenosis on the left due to spurring and mild subarticular and foraminal stenosis on the right. No definite arthrodesis at this level.  IMPRESSION: Interval development of moderate to large extruded disc fragment at L2-3. This is causing moderate to severe spinal stenosis. Disc protrusion extends into the foramen on the left. There is significant foraminal and subarticular stenosis bilaterally left greater than right  Laminectomy L3-4 with mild spinal stenosis and moderate subarticular stenosis bilaterally  Solid fusion L4-5  PLIF L5-S1 without arthrodesis identified. Interbody spacer on the left projects into the ventral epidural space unchanged from prior studies. Moderate to severe subarticular stenosis on the left.   Electronically Signed   By: Franchot Gallo M.D.   On: 05/04/2019 14:04   She reports that she quit smoking about 52 years ago. She has never used smokeless tobacco.  Recent Labs    02/20/19 1022  HGBA1C 15.2*    Objective:  VS:  HT:    WT:   BMI:     BP:(!) 196/86  HR:(!) 52bpm  TEMP: ( )  RESP:  Physical Exam  Ortho Exam Imaging: XR C-ARM NO REPORT  Result Date: 08/13/2019 Please see Notes tab for imaging impression.   Past Medical/Family/Surgical/Social History: Medications &  Allergies reviewed per EMR, new medications updated. Patient Active Problem List   Diagnosis Date Noted  . Right-sided thoracic back pain 07/01/2019  . Hyperlipidemia 07/17/2017  . History of lumbar spinal fusion   . History of fusion of lumbar spine   . Anemia due to blood loss 11/02/2016    Class: Acute  . Lethargy 10/31/2016  . AKI (acute kidney injury) ([place]) 10/31/2016  . Chronic diastolic CHF (congestive heart failure) ([place]) 10/31/2016    . Cough   . Leukocytosis   . Spondylolisthesis, lumbar region 10/30/2016    Class: Chronic  . Spinal stenosis of lumbar region 10/30/2016    Class: Chronic  . Retained orthopedic hardware 10/30/2016    Class: Chronic  . Spinal stenosis of lumbar region with neurogenic claudication 10/30/2016  . Genetic testing 09/14/2015  . Atypical ductal hyperplasia of left breast 08/24/2015  . Family history of breast cancer   . Family history of prostate cancer   . Family history of stomach cancer   . Abnormal nuclear stress test 12/29/2014  . Cervical spondylosis with myelopathy and radiculopathy 06/11/2014  . Carpal tunnel syndrome 05/14/2014  . Aftercare following surgery of the circulatory system, [place] 03/11/2014  . Pain of right lower extremity 03/11/2014  . Atypical lobular hyperplasia of left breast 02/12/2014  . Fibromyalgia 09/21/2013  . Carotid artery disease ([place]) 02/29/2012  . Back abscess 01/22/2012  . Chest pain with moderate risk of acute coronary syndrome 12/09/2009  . Shortness of breath 06/10/2009  . Orthostatic hypotension 01/31/2009  . CAD (coronary artery disease) 12/23/2008  . Normocytic anemia 07/21/2008  . Headache(784.0) 07/21/2008  . History of TIA (transient ischemic attack) 07/21/2008  . Edema 02/27/2008  . Diabetes mellitus with complication ([place]) 99991111  . Depression 07/16/2007  . Dyslipidemia 02/21/2007  . Essential hypertension 02/21/2007  . ASTHMA 02/21/2007  . GERD 02/21/2007  . LOW BACK PAIN 02/21/2007   Past Medical History:  Diagnosis Date  . Abnormal nuclear stress test 12/29/2014  . Allergy   . Anemia, unspecified   . Arthritis   . Blood transfusion without reported diagnosis    2018  . CAD (coronary artery disease)    sees. Dr. Burt Knack. s/p cath with PCI of RCA (xience des) June 2010. Pt also with LAD and D2 dzs-NL FFR in both areas med Rx  . Carotid artery occlusion   . Cataract    sees Dr. Herbert Deaner   . Depressive disorder, not  elsewhere classified   . Duodenal nodule   . Edema   . Fibromyalgia   . Gastric polyps    COLON  . GERD (gastroesophageal reflux disease)   . Glaucoma    narrow angle, sees Dr. Herbert Deaner   . Headache(784.0)   . Hearing loss    left ear  . Heart murmur    questionable  . Hemorrhoids   . HSV (herpes simplex virus) anogenital infection 05/2014  . HTN (hypertension)   . Hyperlipidemia   . Low back pain    she sees Dr. Louanne Skye, also Dr. Ernestina Patches for pain medications and Benjiman Core PA for injections   . Obesity   . Osteopenia 01/2018   T score -1.6 FRAX 7.1% / 1.4%  . Peripheral vascular disease ([place])   . Stroke [facility])    TIA  "many yrs ago"    . TIA (transient ischemic attack)    also Hx of it.   . Type II or unspecified type diabetes mellitus without mention of complication, not stated  as uncontrolled    sees Dr. Carrolyn Meiers    Family History  Problem Relation Age of Onset  . Hypertension Mother   . Hypertension Sister   . Hyperlipidemia Sister   . Hypertension Brother   . Cancer Brother        PROSTATE/LUNG  . Diabetes Brother   . Heart disease Brother        Before age 19 - Bypass  . Varicose Veins Brother   . Cancer Father        Prostate and pancreatic   . Stomach cancer Maternal Grandmother 31  . Heart disease Maternal Grandfather   . Breast cancer Maternal Aunt 51  . Cancer Maternal Aunt        Stomach  . Breast cancer Cousin 48       maternal first cousin  . Prostate cancer Cousin 52  . Breast cancer Cousin 76  . Esophageal cancer Cousin   . Breast cancer Cousin 40       maternal first cousin's daughter  . Coronary artery disease Neg Hx        premature CAD  . Colon cancer Neg Hx   . Colon polyps Neg Hx   . Rectal cancer Neg Hx    Past Surgical History:  Procedure Laterality Date  . ABDOMINAL HYSTERECTOMY  age 65   TAH and USO  Leiomyomata  . ANTERIOR CERVICAL CORPECTOMY N/A 06/11/2014   Procedure: ANTERIOR CERVICAL CORPECTOMY CERVICAL SIX; WITH CERVICAL  FIVE TO CERVICAL SEVEN ARTHRODESIS;  Surgeon: Ashok Pall, MD;  Location: [place] [facility];  Service: Neurosurgery;  Laterality: N/A;  . BACK SURGERY  2008   lumb fusion  . BREAST BIOPSY Left 01/26/2014   Procedure: EXCISION LEFT BREAST MASS;  Surgeon: Adin Hector, MD;  Location: [place];  Service: General;  Laterality: Left;  . BREAST LUMPECTOMY WITH RADIOACTIVE SEED LOCALIZATION Left 07/24/2017   Procedure: LEFT BREAST LUMPECTOMY WITH RADIOACTIVE SEED LOCALIZATION ERAS;  Surgeon: Erroll Luna, MD;  Location: [place];  Service: General;  Laterality: Left;  . CARDIAC CATHETERIZATION    . CARDIAC CATHETERIZATION N/A 12/29/2014   Procedure: Left Heart Cath and Coronary Angiography;  Surgeon: Sherren Mocha, MD;  Location: [place] [facility];  Service: Cardiovascular;  Laterality: N/A;  . CAROTID ENDARTERECTOMY  march 2012   per Dr. Morton Amy  . CARPAL TUNNEL RELEASE Left 05/14/2014   Procedure: Left Carpal tunnel release;  Surgeon: Ashok Pall, MD;  Location: [place] [facility];  Service: Neurosurgery;  Laterality: Left;  Left Carpal tunnel release  . CARPAL TUNNEL RELEASE Bilateral   . CHOLECYSTECTOMY    . COLONOSCOPY  06/26/2017   per Dr. Henrene Pastor, adenomatous polyp, repeat in 5 yrs    . CORONARY STENT PLACEMENT     Dr. Burt Knack  . DILATION AND CURETTAGE OF UTERUS    . ESOPHAGOGASTRODUODENOSCOPY  02-08-06   per Dr. Henrene Pastor, gastritis   . EYE SURGERY Left June 2016   Cataract  . GLAUCOMA SURGERY     with laser, per Dr. Henrene Pastor   . HARDWARE REMOVAL  10/30/2016   Procedure: HARDWARE REMOVAL;  Surgeon: Jessy Oto, MD;  Location: [place];  Service: Orthopedics;;  . LUMBAR FUSION  2008  . POLYPECTOMY    . POSTERIOR LUMBAR FUSION  10/30/2016   Removal of hardware rods and pedicle screws lumbar four, Extension of fusion L4-5 to L5-S1 with reinsertion of screws at L 4, left L5-S1 transforaminal lumbar interbody fusion,  Exploration  left L5 nerve root, bilateral  decompressive laminectomy L3-4/notes 10/30/2016  . ULNAR NERVE TRANSPOSITION Left 05/14/2014   Procedure: Left Ulnar nerve decompression;  Surgeon: Ashok Pall, MD;  Location: [place] [facility];  Service: Neurosurgery;  Laterality: Left;  Left Ulnar nerve decompression   Social History   Occupational History  . Not on file  Tobacco Use  . Smoking status: Former Smoker    Quit date: 01/22/1967    Years since quitting: 52.5  . Smokeless tobacco: Never Used  Substance and Sexual Activity  . Alcohol use: No    Alcohol/week: 0.0 standard drinks  . Drug use: No  . Sexual activity: Never    Birth control/protection: Surgical, Post-menopausal    Comment: HYST-1st intercourse 17 yo-5 partners

## 2019-08-13 NOTE — Procedures (Signed)
Lumbosacral Transforaminal Epidural Steroid Injection - Sub-Pedicular Approach with Fluoroscopic Guidance  Patient: [name]      Date of Birth: 1945/08/11 MRN: [id number] PCP: Laurey Morale, MD      Visit Date: 08/13/2019   Universal Protocol:    Date/Time: 08/13/2019  Consent Given By: the patient  Position: PRONE  Additional Comments: Vital signs were monitored before and after the procedure. Patient was prepped and draped in the usual sterile fashion. The correct patient, procedure, and site was verified.   Injection Procedure Details:  Procedure Site One Meds Administered:  Meds ordered this encounter  Medications  . dexamethasone (DECADRON) injection 15 mg    Laterality: Bilateral  Location/Site:  L2-L3  Needle size: 22 G  Needle type: Spinal  Needle Placement: Transforaminal  Findings:    -Comments: Excellent flow of contrast along the nerve and into the epidural space.  Right-sided injection showed the foramen to be fairly stenotic.  Procedure Details: After squaring off the end-plates to get a true AP view, the C-arm was positioned so that an oblique view of the foramen as noted above was visualized. The target area is just inferior to the "nose of the scotty dog" or sub pedicular. The soft tissues overlying this structure were infiltrated with 2-3 ml. of 1% Lidocaine without Epinephrine.  The spinal needle was inserted toward the target using a "trajectory" view along the fluoroscope beam.  Under AP and lateral visualization, the needle was advanced so it did not puncture dura and was located close the 6 O'Clock position of the pedical in AP tracterory. Biplanar projections were used to confirm position. Aspiration was confirmed to be negative for CSF and/or blood. A 1-2 ml. volume of Isovue-250 was injected and flow of contrast was noted at each level. Radiographs were obtained for documentation purposes.   After attaining the desired flow of  contrast documented above, a 0.5 to 1.0 ml test dose of 0.25% Marcaine was injected into each respective transforaminal space.  The patient was observed for 90 seconds post injection.  After no sensory deficits were reported, and normal lower extremity motor function was noted,   the above injectate was administered so that equal amounts of the injectate were placed at each foramen (level) into the transforaminal epidural space.   Additional Comments:  The patient tolerated the procedure well Dressing: 2 x 2 sterile gauze and Band-Aid    Post-procedure details: Patient was observed during the procedure. Post-procedure instructions were reviewed.  Patient left the clinic in stable condition.

## 2019-08-13 NOTE — Progress Notes (Signed)
 .  Numeric Pain Rating Scale and Functional Assessment Average Pain 5   In the last MONTH (on 0-10 scale) has pain interfered with the following?  1. General activity like being  able to carry out your everyday physical activities such as walking, climbing stairs, carrying groceries, or moving a chair?  Rating(6)   +Driver, -BT, -Dye Allergies.  

## 2019-08-19 ENCOUNTER — Other Ambulatory Visit: Payer: Self-pay

## 2019-08-19 ENCOUNTER — Ambulatory Visit (INDEPENDENT_AMBULATORY_CARE_PROVIDER_SITE_OTHER): Payer: Medicare Other | Admitting: Specialist

## 2019-08-19 ENCOUNTER — Encounter: Payer: Self-pay | Admitting: Specialist

## 2019-08-19 VITALS — BP 142/76 | HR 54 | Ht 64.0 in | Wt 180.0 lb

## 2019-08-19 DIAGNOSIS — M5136 Other intervertebral disc degeneration, lumbar region: Secondary | ICD-10-CM

## 2019-08-19 DIAGNOSIS — M5126 Other intervertebral disc displacement, lumbar region: Secondary | ICD-10-CM

## 2019-08-19 DIAGNOSIS — M4326 Fusion of spine, lumbar region: Secondary | ICD-10-CM

## 2019-08-19 DIAGNOSIS — M25511 Pain in right shoulder: Secondary | ICD-10-CM | POA: Diagnosis not present

## 2019-08-19 DIAGNOSIS — M48062 Spinal stenosis, lumbar region with neurogenic claudication: Secondary | ICD-10-CM

## 2019-08-19 DIAGNOSIS — M546 Pain in thoracic spine: Secondary | ICD-10-CM

## 2019-08-19 NOTE — Progress Notes (Signed)
Office Visit Note   Patient: [name]           Date of Birth: 08/25/1945           MRN: [id number] Visit Date: 08/19/2019              Requested by: Laurey Morale, MD [place],  [place] [id number] PCP: Laurey Morale, MD   Assessment & Plan: Visit Diagnoses:  1. Fusion of lumbar spine   2. HNP (herniated nucleus pulposus), lumbar   3. Spinal stenosis of lumbar region with neurogenic claudication   4. Pain in thoracic spine   5. Degenerative disc disease, lumbar   6. Acute pain of right shoulder   7. Lumbar disc herniation   8. Fusion of spine of lumbar region     Plan:Avoid bending, stooping and avoid lifting weights greater than 10 lbs. Avoid prolong standing and walking. Avoid frequent bending and stooping  No lifting greater than 10 lbs. May use ice or moist heat for pain. Weight loss is of benefit. Handicap license is approved. Call us if the pain recurrs and we would haveDr. Newton's secretary/Assistant call to arrange for further epidural steroid injection  If there is increased numbness or weakness or any bowel or bladder changes please call us as disc herniations can worsen 5% of the time.  Recommend continue to be followed to assess and determine if any changes in nerve function are occurring.   Follow-Up Instructions: No follow-ups on file.   Orders:  No orders of the defined types were placed in this encounter.  No orders of the defined types were placed in this encounter.     Procedures: No procedures performed   Clinical Data: No additional findings.   Subjective: Chief Complaint  Patient presents with  . Lower Back - Follow-up    She had bilat L2-3 TF injections and she got 55-60% relief    74 year old female with history of TIAs and Lumbar fusion L4-5 and L5-S1 with nearly autofused L3-4. Has central disc protrusion L2-3. Complaint of mid thoracic pain. She had bilateral L2 transforamenal ESI s with good  improvement in her pain pattern with decreased pain by as much as 55-65%. She is pleased with the improvement. She is standing and walking better. The leg pain is not that bad and she never had much leg pain. Once had right anterior thigh pain and she is better. No bowel or bladder difficulties.    Review of Systems  Constitutional: Negative.   HENT: Negative.   Eyes: Negative.   Respiratory: Negative.   Cardiovascular: Negative.   Gastrointestinal: Negative.   Endocrine: Negative.   Genitourinary: Negative.   Musculoskeletal: Positive for back pain and gait problem. Negative for arthralgias, joint swelling, myalgias, neck pain and neck stiffness.  Skin: Negative.   Allergic/Immunologic: Negative.   Hematological: Negative.   Psychiatric/Behavioral: Negative.      Objective: Vital Signs: BP (!) 142/76   Pulse (!) 54   Ht 5\' 4"  (1.626 m)   Wt 180 lb (81.6 kg)   BMI 30.90 kg/m   Physical Exam  Ortho Exam  Specialty Comments:  No specialty comments available.  Imaging: No results found.   PMFS History: Patient Active Problem List   Diagnosis Date Noted  . Anemia due to blood loss 11/02/2016    Priority: High    Class: Acute  . Spondylolisthesis, lumbar region 10/30/2016    Priority: High  Class: Chronic  . Spinal stenosis of lumbar region 10/30/2016    Priority: High    Class: Chronic  . Retained orthopedic hardware 10/30/2016    Priority: High    Class: Chronic  . Right-sided thoracic back pain 07/01/2019  . Hyperlipidemia 07/17/2017  . History of lumbar spinal fusion   . History of fusion of lumbar spine   . Lethargy 10/31/2016  . AKI (acute kidney injury) ([employer]) 10/31/2016  . Chronic diastolic CHF (congestive heart failure) ([place]) 10/31/2016  . Cough   . Leukocytosis   . Spinal stenosis of lumbar region with neurogenic claudication 10/30/2016  . Genetic testing 09/14/2015  . Atypical ductal hyperplasia of left breast 08/24/2015  . Family history of  breast cancer   . Family history of prostate cancer   . Family history of stomach cancer   . Abnormal nuclear stress test 12/29/2014  . Cervical spondylosis with myelopathy and radiculopathy 06/11/2014  . Carpal tunnel syndrome 05/14/2014  . Aftercare following surgery of the circulatory system, [place] 03/11/2014  . Pain of right lower extremity 03/11/2014  . Atypical lobular hyperplasia of left breast 02/12/2014  . Fibromyalgia 09/21/2013  . Carotid artery disease ([place]) 02/29/2012  . Back abscess 01/22/2012  . Chest pain with moderate risk of acute coronary syndrome 12/09/2009  . Shortness of breath 06/10/2009  . Orthostatic hypotension 01/31/2009  . CAD (coronary artery disease) 12/23/2008  . Normocytic anemia 07/21/2008  . Headache(784.0) 07/21/2008  . History of TIA (transient ischemic attack) 07/21/2008  . Edema 02/27/2008  . Diabetes mellitus with complication ([place]) 99991111  . Depression 07/16/2007  . Dyslipidemia 02/21/2007  . Essential hypertension 02/21/2007  . ASTHMA 02/21/2007  . GERD 02/21/2007  . LOW BACK PAIN 02/21/2007   Past Medical History:  Diagnosis Date  . Abnormal nuclear stress test 12/29/2014  . Allergy   . Anemia, unspecified   . Arthritis   . Blood transfusion without reported diagnosis    2018  . CAD (coronary artery disease)    sees. Dr. Burt Knack. s/p cath with PCI of RCA (xience des) June 2010. Pt also with LAD and D2 dzs-NL FFR in both areas med Rx  . Carotid artery occlusion   . Cataract    sees Dr. Herbert Deaner   . Depressive disorder, not elsewhere classified   . Duodenal nodule   . Edema   . Fibromyalgia   . Gastric polyps    COLON  . GERD (gastroesophageal reflux disease)   . Glaucoma    narrow angle, sees Dr. Herbert Deaner   . Headache(784.0)   . Hearing loss    left ear  . Heart murmur    questionable  . Hemorrhoids   . HSV (herpes simplex virus) anogenital infection 05/2014  . HTN (hypertension)   . Hyperlipidemia   . Low back pain     she sees Dr. Louanne Skye, also Dr. Ernestina Patches for pain medications and Benjiman Core PA for injections   . Obesity   . Osteopenia 01/2018   T score -1.6 FRAX 7.1% / 1.4%  . Peripheral vascular disease ([place])   . Stroke [facility])    TIA  "many yrs ago"    . TIA (transient ischemic attack)    also Hx of it.   . Type II or unspecified type diabetes mellitus without mention of complication, not stated as uncontrolled    sees Dr. Carrolyn Meiers     Family History  Problem Relation Age of Onset  . Hypertension Mother   . Hypertension  Sister   . Hyperlipidemia Sister   . Hypertension Brother   . Cancer Brother        PROSTATE/LUNG  . Diabetes Brother   . Heart disease Brother        Before age 44 - Bypass  . Varicose Veins Brother   . Cancer Father        Prostate and pancreatic   . Stomach cancer Maternal Grandmother 25  . Heart disease Maternal Grandfather   . Breast cancer Maternal Aunt 58  . Cancer Maternal Aunt        Stomach  . Breast cancer Cousin 46       maternal first cousin  . Prostate cancer Cousin 11  . Breast cancer Cousin 32  . Esophageal cancer Cousin   . Breast cancer Cousin 40       maternal first cousin's daughter  . Coronary artery disease Neg Hx        premature CAD  . Colon cancer Neg Hx   . Colon polyps Neg Hx   . Rectal cancer Neg Hx     Past Surgical History:  Procedure Laterality Date  . ABDOMINAL HYSTERECTOMY  age 89   TAH and USO  Leiomyomata  . ANTERIOR CERVICAL CORPECTOMY N/A 06/11/2014   Procedure: ANTERIOR CERVICAL CORPECTOMY CERVICAL SIX; WITH CERVICAL FIVE TO CERVICAL SEVEN ARTHRODESIS;  Surgeon: Ashok Pall, MD;  Location: [place] [facility]URO [facility]S;  Service: Neurosurgery;  Laterality: N/A;  . BACK SURGERY  2008   lumb fusion  . BREAST BIOPSY Left 01/26/2014   Procedure: EXCISION LEFT BREAST MASS;  Surgeon: Adin Hector, MD;  Location: [place];  Service: General;  Laterality: Left;  . BREAST LUMPECTOMY WITH RADIOACTIVE SEED LOCALIZATION Left  07/24/2017   Procedure: LEFT BREAST LUMPECTOMY WITH RADIOACTIVE SEED LOCALIZATION ERAS;  Surgeon: Erroll Luna, MD;  Location: [place];  Service: General;  Laterality: Left;  . CARDIAC CATHETERIZATION    . CARDIAC CATHETERIZATION N/A 12/29/2014   Procedure: Left Heart Cath and Coronary Angiography;  Surgeon: Sherren Mocha, MD;  Location: [place] CV LAB;  Service: Cardiovascular;  Laterality: N/A;  . CAROTID ENDARTERECTOMY  march 2012   per Dr. Morton Amy  . CARPAL TUNNEL RELEASE Left 05/14/2014   Procedure: Left Carpal tunnel release;  Surgeon: Ashok Pall, MD;  Location: [place] [facility];  Service: Neurosurgery;  Laterality: Left;  Left Carpal tunnel release  . CARPAL TUNNEL RELEASE Bilateral   . CHOLECYSTECTOMY    . COLONOSCOPY  06/26/2017   per Dr. Henrene Pastor, adenomatous polyp, repeat in 5 yrs    . CORONARY STENT PLACEMENT     Dr. Burt Knack  . DILATION AND CURETTAGE OF UTERUS    . ESOPHAGOGASTRODUODENOSCOPY  02-08-06   per Dr. Henrene Pastor, gastritis   . EYE SURGERY Left June 2016   Cataract  . GLAUCOMA SURGERY     with laser, per Dr. Henrene Pastor   . HARDWARE REMOVAL  10/30/2016   Procedure: HARDWARE REMOVAL;  Surgeon: Jessy Oto, MD;  Location: [place];  Service: Orthopedics;;  . LUMBAR FUSION  2008  . POLYPECTOMY    . POSTERIOR LUMBAR FUSION  10/30/2016   Removal of hardware rods and pedicle screws lumbar four, Extension of fusion L4-5 to L5-S1 with reinsertion of screws at L 4, left L5-S1 transforaminal lumbar interbody fusion, Exploration  left L5 nerve root, bilateral decompressive laminectomy L3-4/notes 10/30/2016  . ULNAR NERVE TRANSPOSITION Left 05/14/2014   Procedure: Left Ulnar nerve decompression;  Surgeon:  Ashok Pall, MD;  Location: [place] NEURO ORS;  Service: Neurosurgery;  Laterality: Left;  Left Ulnar nerve decompression   Social History   Occupational History  . Not on file  Tobacco Use  . Smoking status: Former Smoker    Quit date: 01/22/1967    Years since  quitting: 52.6  . Smokeless tobacco: Never Used  Substance and Sexual Activity  . Alcohol use: No    Alcohol/week: 0.0 standard drinks  . Drug use: No  . Sexual activity: Never    Birth control/protection: Surgical, Post-menopausal    Comment: HYST-1st intercourse 36 yo-5 partners

## 2019-08-19 NOTE — Patient Instructions (Signed)
Avoid bending, stooping and avoid lifting weights greater than 10 lbs. Avoid prolong standing and walking. Avoid frequent bending and stooping  No lifting greater than 10 lbs. May use ice or moist heat for pain. Weight loss is of benefit. Handicap license is approved. Call us if the pain recurrs and we would haveDr. Newton's secretary/Assistant call to arrange for further epidural steroid injection  If there is increased numbness or weakness or any bowel or bladder changes please call us as disc herniations can worsen 5% of the time.  Recommend continue to be followed to assess and determine if any changes in nerve function are occurring.

## 2019-08-20 DIAGNOSIS — Z1231 Encounter for screening mammogram for malignant neoplasm of breast: Secondary | ICD-10-CM | POA: Diagnosis not present

## 2019-08-20 LAB — HM MAMMOGRAPHY

## 2019-08-25 DIAGNOSIS — I10 Essential (primary) hypertension: Secondary | ICD-10-CM | POA: Diagnosis not present

## 2019-08-25 DIAGNOSIS — E1159 Type 2 diabetes mellitus with other circulatory complications: Secondary | ICD-10-CM | POA: Diagnosis not present

## 2019-08-25 DIAGNOSIS — N1831 Chronic kidney disease, stage 3a: Secondary | ICD-10-CM | POA: Diagnosis not present

## 2019-08-25 DIAGNOSIS — Z794 Long term (current) use of insulin: Secondary | ICD-10-CM | POA: Diagnosis not present

## 2019-09-10 ENCOUNTER — Encounter: Payer: Self-pay | Admitting: Family Medicine

## 2019-09-11 DIAGNOSIS — N1831 Chronic kidney disease, stage 3a: Secondary | ICD-10-CM | POA: Diagnosis not present

## 2019-09-11 DIAGNOSIS — E669 Obesity, unspecified: Secondary | ICD-10-CM | POA: Diagnosis not present

## 2019-09-11 DIAGNOSIS — M859 Disorder of bone density and structure, unspecified: Secondary | ICD-10-CM | POA: Diagnosis not present

## 2019-09-11 DIAGNOSIS — D126 Benign neoplasm of colon, unspecified: Secondary | ICD-10-CM | POA: Diagnosis not present

## 2019-09-11 DIAGNOSIS — I2510 Atherosclerotic heart disease of native coronary artery without angina pectoris: Secondary | ICD-10-CM | POA: Diagnosis not present

## 2019-09-11 DIAGNOSIS — I5189 Other ill-defined heart diseases: Secondary | ICD-10-CM | POA: Diagnosis not present

## 2019-09-11 DIAGNOSIS — Z1331 Encounter for screening for depression: Secondary | ICD-10-CM | POA: Diagnosis not present

## 2019-09-11 DIAGNOSIS — N62 Hypertrophy of breast: Secondary | ICD-10-CM | POA: Diagnosis not present

## 2019-09-11 DIAGNOSIS — H905 Unspecified sensorineural hearing loss: Secondary | ICD-10-CM | POA: Diagnosis not present

## 2019-09-11 DIAGNOSIS — E1159 Type 2 diabetes mellitus with other circulatory complications: Secondary | ICD-10-CM | POA: Diagnosis not present

## 2019-09-11 DIAGNOSIS — E7849 Other hyperlipidemia: Secondary | ICD-10-CM | POA: Diagnosis not present

## 2019-09-11 DIAGNOSIS — Z794 Long term (current) use of insulin: Secondary | ICD-10-CM | POA: Diagnosis not present

## 2019-09-30 ENCOUNTER — Other Ambulatory Visit: Payer: Self-pay

## 2019-09-30 ENCOUNTER — Ambulatory Visit (INDEPENDENT_AMBULATORY_CARE_PROVIDER_SITE_OTHER): Payer: Medicare Other | Admitting: Specialist

## 2019-09-30 ENCOUNTER — Encounter: Payer: Self-pay | Admitting: Specialist

## 2019-09-30 VITALS — BP 148/73 | HR 63 | Ht 64.0 in | Wt 180.0 lb

## 2019-09-30 DIAGNOSIS — M4807 Spinal stenosis, lumbosacral region: Secondary | ICD-10-CM

## 2019-09-30 DIAGNOSIS — M5126 Other intervertebral disc displacement, lumbar region: Secondary | ICD-10-CM

## 2019-09-30 DIAGNOSIS — M4326 Fusion of spine, lumbar region: Secondary | ICD-10-CM

## 2019-09-30 DIAGNOSIS — M5414 Radiculopathy, thoracic region: Secondary | ICD-10-CM | POA: Diagnosis not present

## 2019-09-30 NOTE — Patient Instructions (Signed)
Avoid bending, stooping and avoid lifting weights greater than 10 lbs. Avoid prolong standing and walking. Avoid frequent bending and stooping  No lifting greater than 10 lbs. May use ice or moist heat for pain. Weight loss is of benefit. Handicap license is approved. Dr. Newton's secretary/Assistant will call to arrange for epidural steroid injection  

## 2019-09-30 NOTE — Progress Notes (Signed)
Office Visit Note   Patient: [name]           Date of Birth: [birth year]           MRN: [id number] Visit Date: 09/30/2019              Requested by: Laurey Morale, MD [place],  [place] [address] PCP: Laurey Morale, MD   Assessment & Plan: Visit Diagnoses:  1. Lumbar disc herniation   2. Fusion of spine of lumbar region   3. Spinal stenosis of lumbosacral region   4. Thoracic radiculopathy     Plan: Avoid bending, stooping and avoid lifting weights greater than 10 lbs. Avoid prolong standing and walking. Avoid frequent bending and stooping  No lifting greater than 10 lbs. May use ice or moist heat for pain. Weight loss is of benefit. Handicap license is approved. Dr. Romona Curls secretary/Assistant will call to arrange for epidural steroid injection  Follow-Up Instructions: Return in about 6 weeks (around 11/11/2019).   Orders:  Orders Placed This Encounter  Procedures  . Ambulatory referral to Physical Medicine Rehab   No orders of the defined types were placed in this encounter.     Procedures: No procedures performed   Clinical Data: No additional findings.   Subjective: Chief Complaint  Patient presents with  . Lower Back - Follow-up    74 year old female with history of L4-S1 TLIFs 3 years ago with pain with standing and walking. She has MRI with signs of DDD and mild stenosis at the L3-4 level but a disc protrusion With central and left sided lumbar spinal stenosis at L2-3, the disc extends inferiorly over the body of L3. She has had ESI TF bilateral L2-3 with good relief of pain as much as 75%. No bowel or bladder difficulties. Is seeing Dr. Forde Dandy and tried on trulicity with some sideeffects, diarrhea, nausea and she stopped the meds.    Review of Systems  Constitutional: Negative.  Negative for activity change, appetite change, chills, diaphoresis, fatigue, fever and unexpected weight change.  HENT: Negative.  Negative for  congestion, dental problem, drooling, ear discharge, ear pain, facial swelling, hearing loss, mouth sores, nosebleeds, postnasal drip, rhinorrhea, sinus pressure, sinus pain, sneezing, sore throat, tinnitus, trouble swallowing and voice change.   Eyes: Negative.  Negative for photophobia, pain, discharge, redness, itching and visual disturbance.  Respiratory: Negative.  Negative for apnea, cough, choking, chest tightness, shortness of breath, wheezing and stridor.   Cardiovascular: Negative.  Negative for chest pain, palpitations and leg swelling.  Gastrointestinal: Positive for diarrhea, nausea (ith trulicity) and vomiting. Negative for abdominal distention, abdominal pain, anal bleeding, blood in stool, constipation and rectal pain.  Endocrine: Negative.  Negative for cold intolerance, heat intolerance, polydipsia, polyphagia and polyuria.  Genitourinary: Negative.  Negative for difficulty urinating, dyspareunia, dysuria, enuresis, flank pain, frequency, genital sores, hematuria and urgency.  Musculoskeletal: Positive for back pain. Negative for arthralgias, gait problem, joint swelling, myalgias, neck pain and neck stiffness.  Skin: Negative.  Negative for color change, pallor, rash and wound.  Allergic/Immunologic: Negative.  Negative for environmental allergies, food allergies and immunocompromised state.  Neurological: Positive for weakness and numbness. Negative for dizziness, tremors, seizures, syncope, facial asymmetry, speech difficulty, light-headedness and headaches.  Hematological: Negative.   Psychiatric/Behavioral: Negative.      Objective: Vital Signs: BP (!) 148/73 (BP Location: Left Arm, Patient Position: Sitting)   Pulse 63   Ht 5\' 4"  (1.626 m)  Wt 180 lb (81.6 kg)   BMI 30.90 kg/m   Physical Exam Constitutional:      Appearance: She is well-developed.  HENT:     Head: Normocephalic and atraumatic.  Eyes:     Pupils: Pupils are equal, round, and reactive to light.    Pulmonary:     Effort: Pulmonary effort is normal.     Breath sounds: Normal breath sounds.  Abdominal:     General: Bowel sounds are normal.     Palpations: Abdomen is soft.  Musculoskeletal:     Cervical back: Normal range of motion and neck supple.     Lumbar back: Negative right straight leg raise test and negative left straight leg raise test.  Skin:    General: Skin is warm and dry.  Neurological:     Mental Status: She is alert and oriented to person, place, and time.  Psychiatric:        Behavior: Behavior normal.        Thought Content: Thought content normal.        Judgment: Judgment normal.     Back Exam   Tenderness  The patient is experiencing tenderness in the lumbar.  Range of Motion  Extension: abnormal  Flexion: abnormal  Lateral bend right: abnormal  Lateral bend left: abnormal  Rotation right: abnormal  Rotation left: abnormal   Muscle Strength  Right Quadriceps:  5/5  Left Quadriceps:  5/5  Right Hamstrings:  5/5  Left Hamstrings:  5/5   Tests  Straight leg raise right: negative Straight leg raise left: negative  Reflexes  Patellar: 0/4 Achilles: 0/4 Babinski's sign: normal   Other  Toe walk: normal Heel walk: normal Erythema: no back redness Scars: present      Specialty Comments:  No specialty comments available.  Imaging: No results found.   PMFS History: Patient Active Problem List   Diagnosis Date Noted  . Anemia due to blood loss 11/02/2016    Priority: High    Class: Acute  . Spondylolisthesis, lumbar region 10/30/2016    Priority: High    Class: Chronic  . Spinal stenosis of lumbar region 10/30/2016    Priority: High    Class: Chronic  . Retained orthopedic hardware 10/30/2016    Priority: High    Class: Chronic  . Right-sided thoracic back pain 07/01/2019  . Hyperlipidemia 07/17/2017  . History of lumbar spinal fusion   . History of fusion of lumbar spine   . Lethargy 10/31/2016  . AKI (acute kidney  injury) ([place]) 10/31/2016  . Chronic diastolic CHF (congestive heart failure) ([place]) 10/31/2016  . Cough   . Leukocytosis   . Spinal stenosis of lumbar region with neurogenic claudication 10/30/2016  . Genetic testing 09/14/2015  . Atypical ductal hyperplasia of left breast 08/24/2015  . Family history of breast cancer   . Family history of prostate cancer   . Family history of stomach cancer   . Abnormal nuclear stress test 12/29/2014  . Cervical spondylosis with myelopathy and radiculopathy 06/11/2014  . Carpal tunnel syndrome 05/14/2014  . Aftercare following surgery of the circulatory system, [place] 03/11/2014  . Pain of right lower extremity 03/11/2014  . Atypical lobular hyperplasia of left breast 02/12/2014  . Fibromyalgia 09/21/2013  . Carotid artery disease ([place]) 02/29/2012  . Back abscess 01/22/2012  . Chest pain with moderate risk of acute coronary syndrome 12/09/2009  . Shortness of breath 06/10/2009  . Orthostatic hypotension 01/31/2009  . CAD (coronary artery disease) 12/23/2008  .  Normocytic anemia 07/21/2008  . Headache(784.0) 07/21/2008  . History of TIA (transient ischemic attack) 07/21/2008  . Edema 02/27/2008  . Diabetes mellitus with complication ([place]) 99991111  . Depression 07/16/2007  . Dyslipidemia 02/21/2007  . Essential hypertension 02/21/2007  . ASTHMA 02/21/2007  . GERD 02/21/2007  . LOW BACK PAIN 02/21/2007   Past Medical History:  Diagnosis Date  . Abnormal nuclear stress test 12/29/2014  . Allergy   . Anemia, unspecified   . Arthritis   . Blood transfusion without reported diagnosis    2018  . CAD (coronary artery disease)    sees. Dr. Burt Knack. s/p cath with PCI of RCA (xience des) June 2010. Pt also with LAD and D2 dzs-NL FFR in both areas med Rx  . Carotid artery occlusion   . Cataract    sees Dr. Herbert Deaner   . Depressive disorder, not elsewhere classified   . Duodenal nodule   . Edema   . Fibromyalgia   . Gastric polyps    COLON  . GERD  (gastroesophageal reflux disease)   . Glaucoma    narrow angle, sees Dr. Herbert Deaner   . Headache(784.0)   . Hearing loss    left ear  . Heart murmur    questionable  . Hemorrhoids   . HSV (herpes simplex virus) anogenital infection 05/2014  . HTN (hypertension)   . Hyperlipidemia   . Low back pain    she sees Dr. Louanne Skye, also Dr. Ernestina Patches for pain medications and Benjiman Core PA for injections   . Obesity   . Osteopenia 01/2018   T score -1.6 FRAX 7.1% / 1.4%  . Peripheral vascular disease ([place])   . Stroke [facility])    TIA  "many yrs ago"    . TIA (transient ischemic attack)    also Hx of it.   . Type II or unspecified type diabetes mellitus without mention of complication, not stated as uncontrolled    sees Dr. Carrolyn Meiers     Family History  Problem Relation Age of Onset  . Hypertension Mother   . Hypertension Sister   . Hyperlipidemia Sister   . Hypertension Brother   . Cancer Brother        PROSTATE/LUNG  . Diabetes Brother   . Heart disease Brother        Before age 55 - Bypass  . Varicose Veins Brother   . Cancer Father        Prostate and pancreatic   . Stomach cancer Maternal Grandmother 48  . Heart disease Maternal Grandfather   . Breast cancer Maternal Aunt 14  . Cancer Maternal Aunt        Stomach  . Breast cancer Cousin 53       maternal first cousin  . Prostate cancer Cousin 29  . Breast cancer Cousin 5  . Esophageal cancer Cousin   . Breast cancer Cousin 40       maternal first cousin's daughter  . Coronary artery disease Neg Hx        premature CAD  . Colon cancer Neg Hx   . Colon polyps Neg Hx   . Rectal cancer Neg Hx     Past Surgical History:  Procedure Laterality Date  . ABDOMINAL HYSTERECTOMY  age 19   TAH and USO  Leiomyomata  . ANTERIOR CERVICAL CORPECTOMY N/A 06/11/2014   Procedure: ANTERIOR CERVICAL CORPECTOMY CERVICAL SIX; WITH CERVICAL FIVE TO CERVICAL SEVEN ARTHRODESIS;  Surgeon: Ashok Pall, MD;  Location: [place]  ORS;  Service:  Neurosurgery;  Laterality: N/A;  . BACK SURGERY  2008   lumb fusion  . BREAST BIOPSY Left 01/26/2014   Procedure: EXCISION LEFT BREAST MASS;  Surgeon: Adin Hector, MD;  Location: [facility];  Service: General;  Laterality: Left;  . BREAST LUMPECTOMY WITH RADIOACTIVE SEED LOCALIZATION Left 07/24/2017   Procedure: LEFT BREAST LUMPECTOMY WITH RADIOACTIVE SEED LOCALIZATION ERAS;  Surgeon: Erroll Luna, MD;  Location: [facility];  Service: General;  Laterality: Left;  . CARDIAC CATHETERIZATION    . CARDIAC CATHETERIZATION N/A 12/29/2014   Procedure: Left Heart Cath and Coronary Angiography;  Surgeon: Sherren Mocha, MD;  Location: [facility] CV LAB;  Service: Cardiovascular;  Laterality: N/A;  . CAROTID ENDARTERECTOMY  march 2012   per Dr. Morton Amy  . CARPAL TUNNEL RELEASE Left 05/14/2014   Procedure: Left Carpal tunnel release;  Surgeon: Ashok Pall, MD;  Location: [place] NEURO ORS;  Service: Neurosurgery;  Laterality: Left;  Left Carpal tunnel release  . CARPAL TUNNEL RELEASE Bilateral   . CHOLECYSTECTOMY    . COLONOSCOPY  06/26/2017   per Dr. Henrene Pastor, adenomatous polyp, repeat in 5 yrs    . CORONARY STENT PLACEMENT     Dr. Burt Knack  . DILATION AND CURETTAGE OF UTERUS    . ESOPHAGOGASTRODUODENOSCOPY  02-08-06   per Dr. Henrene Pastor, gastritis   . EYE SURGERY Left June 2016   Cataract  . GLAUCOMA SURGERY     with laser, per Dr. Henrene Pastor   . HARDWARE REMOVAL  10/30/2016   Procedure: HARDWARE REMOVAL;  Surgeon: Jessy Oto, MD;  Location: [facility];  Service: Orthopedics;;  . LUMBAR FUSION  2008  . POLYPECTOMY    . POSTERIOR LUMBAR FUSION  10/30/2016   Removal of hardware rods and pedicle screws lumbar four, Extension of fusion L4-5 to L5-S1 with reinsertion of screws at L 4, left L5-S1 transforaminal lumbar interbody fusion, Exploration  left L5 nerve root, bilateral decompressive laminectomy L3-4/notes 10/30/2016  . ULNAR NERVE TRANSPOSITION Left 05/14/2014    Procedure: Left Ulnar nerve decompression;  Surgeon: Ashok Pall, MD;  Location: [place] NEURO ORS;  Service: Neurosurgery;  Laterality: Left;  Left Ulnar nerve decompression   Social History   Occupational History  . Not on file  Tobacco Use  . Smoking status: Former Smoker    Quit date: 01/22/1967    Years since quitting: 52.7  . Smokeless tobacco: Never Used  Substance and Sexual Activity  . Alcohol use: No    Alcohol/week: 0.0 standard drinks  . Drug use: No  . Sexual activity: Never    Birth control/protection: Surgical, Post-menopausal    Comment: HYST-1st intercourse 51 yo-5 partners

## 2019-10-19 ENCOUNTER — Other Ambulatory Visit: Payer: Self-pay

## 2019-10-19 DIAGNOSIS — Z1231 Encounter for screening mammogram for malignant neoplasm of breast: Secondary | ICD-10-CM

## 2019-10-26 ENCOUNTER — Ambulatory Visit (INDEPENDENT_AMBULATORY_CARE_PROVIDER_SITE_OTHER): Payer: Medicare Other | Admitting: Physical Medicine and Rehabilitation

## 2019-10-26 ENCOUNTER — Encounter: Payer: Self-pay | Admitting: Physical Medicine and Rehabilitation

## 2019-10-26 ENCOUNTER — Other Ambulatory Visit: Payer: Self-pay

## 2019-10-26 ENCOUNTER — Ambulatory Visit: Payer: Self-pay

## 2019-10-26 VITALS — BP 170/66 | HR 59

## 2019-10-26 DIAGNOSIS — M5116 Intervertebral disc disorders with radiculopathy, lumbar region: Secondary | ICD-10-CM

## 2019-10-26 DIAGNOSIS — M5416 Radiculopathy, lumbar region: Secondary | ICD-10-CM | POA: Diagnosis not present

## 2019-10-26 MED ORDER — DEXAMETHASONE SODIUM PHOSPHATE 10 MG/ML IJ SOLN
15.0000 mg | Freq: Once | INTRAMUSCULAR | Status: AC
  Administered 2019-10-26: 15 mg

## 2019-10-26 NOTE — Progress Notes (Signed)
[name] - 74 y.o. female MRN [id number]  Date of birth: 1946-07-02  Office Visit Note: Visit Date: 10/26/2019 PCP: Laurey Morale, MD Referred by: Laurey Morale, MD  Subjective: Chief Complaint  Patient presents with  . Lower Back - Pain   HPI:  [name] is a 74 y.o. female who comes in today For planned bilateral L2-3 transforaminal epidural steroid injection at the request of Dr. Basil Dess.  This is a repeat injection.  Prior injection in February gave her relief up until just recently.She is getting back and upper hip pain.  She has prior lumbar fusion of L4-5 and L5-S1.  Prior laminectomy at L3-4.  More recent MRI shows disc extrusion at L2-3 with narrowing on both sides.   The patient has failed conservative care including home exercise, medications, time and activity modification.  This injection will be diagnostic and hopefully therapeutic.  Please see requesting physician notes for further details and justification.   ROS Otherwise per HPI.  Assessment & Plan: Visit Diagnoses:  1. Lumbar radiculopathy   2. Radiculopathy due to lumbar intervertebral disc disorder     Plan: No additional findings.   Meds & Orders:  Meds ordered this encounter  Medications  . dexamethasone (DECADRON) injection 15 mg    Orders Placed This Encounter  Procedures  . XR C-ARM NO REPORT  . Epidural Steroid injection    Follow-up: Return for Basil Dess, MD as scheduled.   Procedures: No procedures performed  Lumbosacral Transforaminal Epidural Steroid Injection - Sub-Pedicular Approach with Fluoroscopic Guidance  Patient: [name]      Date of Birth: [birth year] MRN: [id number] PCP: Laurey Morale, MD      Visit Date: 10/26/2019   Universal Protocol:    Date/Time: 10/26/2019  Consent Given By: the patient  Position: PRONE  Additional Comments: Vital signs were monitored before and after the procedure. Patient was prepped and draped in the usual  sterile fashion. The correct patient, procedure, and site was verified.   Injection Procedure Details:  Procedure Site One Meds Administered:  Meds ordered this encounter  Medications  . dexamethasone (DECADRON) injection 15 mg    Laterality: Bilateral  Location/Site:  L2-L3  Needle size: 22 G  Needle type: Spinal  Needle Placement: Transforaminal  Findings:    -Comments: Excellent flow of contrast along the nerve and into the epidural space.  Procedure Details: After squaring off the end-plates to get a true AP view, the C-arm was positioned so that an oblique view of the foramen as noted above was visualized. The target area is just inferior to the "nose of the scotty dog" or sub pedicular. The soft tissues overlying this structure were infiltrated with 2-3 ml. of 1% Lidocaine without Epinephrine.  The spinal needle was inserted toward the target using a "trajectory" view along the fluoroscope beam.  Under AP and lateral visualization, the needle was advanced so it did not puncture dura and was located close the 6 O'Clock position of the pedical in AP tracterory. Biplanar projections were used to confirm position. Aspiration was confirmed to be negative for CSF and/or blood. A 1-2 ml. volume of Isovue-250 was injected and flow of contrast was noted at each level. Radiographs were obtained for documentation purposes.   After attaining the desired flow of contrast documented above, a 0.5 to 1.0 ml test dose of 0.25% Marcaine was injected into each respective transforaminal space.  The patient was observed for 90 seconds post  injection.  After no sensory deficits were reported, and normal lower extremity motor function was noted,   the above injectate was administered so that equal amounts of the injectate were placed at each foramen (level) into the transforaminal epidural space.   Additional Comments:  The patient tolerated the procedure well Dressing: 2 x 2 sterile gauze and  Band-Aid    Post-procedure details: Patient was observed during the procedure. Post-procedure instructions were reviewed.  Patient left the clinic in stable condition.      Clinical History: MRI LUMBAR SPINE WITHOUT AND WITH CONTRAST  TECHNIQUE: Multiplanar and multiecho pulse sequences of the lumbar spine were obtained without and with intravenous contrast.  CONTRAST:  1mL MULTIHANCE GADOBENATE DIMEGLUMINE 529 MG/ML IV SOLN  COMPARISON:  Lumbar MRI 02/13/2016  FINDINGS: Segmentation:  Normal  Alignment:  5 mm anterolisthesis L5-S1 unchanged.  Vertebrae:  Negative for fracture or mass.  Conus medullaris and cauda equina: Conus extends to the L2 level. Conus and cauda equina appear normal.  Paraspinal and other soft tissues: Negative for paraspinous mass or fluid collection.  Disc levels:  L1-2: Mild disc degeneration and spurring on the left. No significant stenosis  L2-3: Moderately large extruded disc fragment in the midline is new since the prior study. This indents the thecal sac is causing moderate to severe spinal stenosis left greater than right. Disc protrusion extends into the left foramen and there is significant left subarticular and foraminal stenosis. There is also significant subarticular and foraminal stenosis on the right.  L3-4: Advanced disc degeneration. Interval laminectomy. Mild spinal stenosis. Moderate subarticular and foraminal stenosis bilaterally due to spurring.  L4-5: Pedicle screw and interbody fusion which appears solid. Posterior laminectomy. No significant spinal or foraminal stenosis  L5-S1: Pedicle screw and interbody fusion. 5 mm anterolisthesis. Interbody spacer appears unilateral on the left and appears to project posteriorly into the ventral epidural space, unchanged from CT of 04/03/2017. There is moderate to severe subarticular and foraminal stenosis on the left due to spurring and mild subarticular and  foraminal stenosis on the right. No definite arthrodesis at this level.  IMPRESSION: Interval development of moderate to large extruded disc fragment at L2-3. This is causing moderate to severe spinal stenosis. Disc protrusion extends into the foramen on the left. There is significant foraminal and subarticular stenosis bilaterally left greater than right  Laminectomy L3-4 with mild spinal stenosis and moderate subarticular stenosis bilaterally  Solid fusion L4-5  PLIF L5-S1 without arthrodesis identified. Interbody spacer on the left projects into the ventral epidural space unchanged from prior studies. Moderate to severe subarticular stenosis on the left.   Electronically Signed   By: Franchot Gallo M.D.   On: 05/04/2019 14:04     Objective:  VS:  HT:    WT:   BMI:     BP:(!) 170/66  HR:(!) 59bpm  TEMP: ( )  RESP:  Physical Exam  Ortho Exam Imaging: XR C-ARM NO REPORT  Result Date: 10/26/2019 Please see Notes tab for imaging impression.

## 2019-10-26 NOTE — Progress Notes (Signed)
 .  Numeric Pain Rating Scale and Functional Assessment Average Pain 4   In the last MONTH (on 0-10 scale) has pain interfered with the following?  1. General activity like being  able to carry out your everyday physical activities such as walking, climbing stairs, carrying groceries, or moving a chair?  Rating(5)   +Driver, -BT, -Dye Allergies.

## 2019-10-26 NOTE — Procedures (Signed)
Lumbosacral Transforaminal Epidural Steroid Injection - Sub-Pedicular Approach with Fluoroscopic Guidance  Patient: [name]      Date of Birth: [birth year] MRN: [id number] PCP: Laurey Morale, MD      Visit Date: 10/26/2019   Universal Protocol:    Date/Time: 10/26/2019  Consent Given By: the patient  Position: PRONE  Additional Comments: Vital signs were monitored before and after the procedure. Patient was prepped and draped in the usual sterile fashion. The correct patient, procedure, and site was verified.   Injection Procedure Details:  Procedure Site One Meds Administered:  Meds ordered this encounter  Medications  . dexamethasone (DECADRON) injection 15 mg    Laterality: Bilateral  Location/Site:  L2-L3  Needle size: 22 G  Needle type: Spinal  Needle Placement: Transforaminal  Findings:    -Comments: Excellent flow of contrast along the nerve and into the epidural space.  Procedure Details: After squaring off the end-plates to get a true AP view, the C-arm was positioned so that an oblique view of the foramen as noted above was visualized. The target area is just inferior to the "nose of the scotty dog" or sub pedicular. The soft tissues overlying this structure were infiltrated with 2-3 ml. of 1% Lidocaine without Epinephrine.  The spinal needle was inserted toward the target using a "trajectory" view along the fluoroscope beam.  Under AP and lateral visualization, the needle was advanced so it did not puncture dura and was located close the 6 O'Clock position of the pedical in AP tracterory. Biplanar projections were used to confirm position. Aspiration was confirmed to be negative for CSF and/or blood. A 1-2 ml. volume of Isovue-250 was injected and flow of contrast was noted at each level. Radiographs were obtained for documentation purposes.   After attaining the desired flow of contrast documented above, a 0.5 to 1.0 ml test dose of 0.25% Marcaine  was injected into each respective transforaminal space.  The patient was observed for 90 seconds post injection.  After no sensory deficits were reported, and normal lower extremity motor function was noted,   the above injectate was administered so that equal amounts of the injectate were placed at each foramen (level) into the transforaminal epidural space.   Additional Comments:  The patient tolerated the procedure well Dressing: 2 x 2 sterile gauze and Band-Aid    Post-procedure details: Patient was observed during the procedure. Post-procedure instructions were reviewed.  Patient left the clinic in stable condition.

## 2019-11-02 DIAGNOSIS — L723 Sebaceous cyst: Secondary | ICD-10-CM | POA: Diagnosis not present

## 2019-11-02 DIAGNOSIS — L850 Acquired ichthyosis: Secondary | ICD-10-CM | POA: Diagnosis not present

## 2019-11-02 DIAGNOSIS — L720 Epidermal cyst: Secondary | ICD-10-CM | POA: Diagnosis not present

## 2019-11-02 DIAGNOSIS — D2362 Other benign neoplasm of skin of left upper limb, including shoulder: Secondary | ICD-10-CM | POA: Diagnosis not present

## 2019-11-09 ENCOUNTER — Telehealth: Payer: Self-pay | Admitting: Family Medicine

## 2019-11-09 NOTE — Chronic Care Management (AMB) (Signed)
  Chronic Care Management   Note  11/09/2019 Name: [name] MRN: [id number] DOB: April 10, 1946  [name] is a 74 y.o. year old female who is a primary care patient of Laurey Morale, MD. I reached out to [name] by phone today in response to a referral sent by [name]'s PCP, Laurey Morale, MD.   [name] was given information about Chronic Care Management services today including:  1. CCM service includes personalized support from designated clinical staff supervised by her physician, including individualized plan of care and coordination with other care providers 2. 24/7 contact phone numbers for assistance for urgent and routine care needs. 3. Service will only be billed when office clinical staff spend 20 minutes or more in a month to coordinate care. 4. Only one practitioner may furnish and bill the service in a calendar month. 5. The patient may stop CCM services at any time (effective at the end of the month) by phone call to the office staff.   Patient agreed to services and verbal consent obtained.   Follow up plan:   [place]

## 2019-11-09 NOTE — Chronic Care Management (AMB) (Signed)
  Chronic Care Management   Note  11/09/2019 Name: [name] MRN: [id number] DOB: 1945/08/06  [name] is a 74 y.o. year old female who is a primary care patient of Laurey Morale, MD. I reached out to [name] by phone today in response to a referral sent by [name]'s PCP, Laurey Morale, MD.   Ms. Flitter was given information about Chronic Care Management services today including:  1. CCM service includes personalized support from designated clinical staff supervised by her physician, including individualized plan of care and coordination with other care providers 2. 24/7 contact phone numbers for assistance for urgent and routine care needs. 3. Service will only be billed when office clinical staff spend 20 minutes or more in a month to coordinate care. 4. Only one practitioner may furnish and bill the service in a calendar month. 5. The patient may stop CCM services at any time (effective at the end of the month) by phone call to the office staff.   Patient agreed to services and verbal consent obtained.   Follow up plan:   [place]

## 2019-11-12 ENCOUNTER — Other Ambulatory Visit: Payer: Self-pay

## 2019-11-12 ENCOUNTER — Encounter: Payer: Self-pay | Admitting: Specialist

## 2019-11-12 ENCOUNTER — Ambulatory Visit (INDEPENDENT_AMBULATORY_CARE_PROVIDER_SITE_OTHER): Payer: Medicare Other | Admitting: Specialist

## 2019-11-12 VITALS — BP 181/83 | HR 64 | Ht 64.0 in | Wt 180.0 lb

## 2019-11-12 DIAGNOSIS — Z4889 Encounter for other specified surgical aftercare: Secondary | ICD-10-CM | POA: Diagnosis not present

## 2019-11-12 DIAGNOSIS — M533 Sacrococcygeal disorders, not elsewhere classified: Secondary | ICD-10-CM

## 2019-11-12 DIAGNOSIS — M960 Pseudarthrosis after fusion or arthrodesis: Secondary | ICD-10-CM | POA: Diagnosis not present

## 2019-11-12 DIAGNOSIS — M48062 Spinal stenosis, lumbar region with neurogenic claudication: Secondary | ICD-10-CM

## 2019-11-12 DIAGNOSIS — M5136 Other intervertebral disc degeneration, lumbar region: Secondary | ICD-10-CM | POA: Diagnosis not present

## 2019-11-12 DIAGNOSIS — M5126 Other intervertebral disc displacement, lumbar region: Secondary | ICD-10-CM | POA: Diagnosis not present

## 2019-11-12 NOTE — Progress Notes (Signed)
Office Visit Note   Patient: [name]           Date of Birth: 1946-04-16           MRN: [id number] Visit Date: 11/12/2019              Requested by: Laurey Morale, MD [place],  [place] [address] PCP: Laurey Morale, MD   Assessment & Plan: Visit Diagnoses:  1. HNP (herniated nucleus pulposus), lumbar   2. Encounter for other specified surgical aftercare   3. Spinal stenosis of lumbar region with neurogenic claudication   4. Degenerative disc disease, lumbar   5. Pseudarthrosis after fusion or arthrodesis   6. Sacroiliac joint dysfunction of right side     Plan: Avoid bending, stooping and avoid lifting weights greater than 10 lbs. Avoid prolong standing and walking. Avoid frequent bending and stooping  No lifting greater than 10 lbs. May use ice or moist heat for pain. Weight loss is of benefit. Handicap license is approved. Will obtain CT scan of the lumbar spine as the study done in 2019 suggested delayed healing of the L5-S1 fusion site. We will decide based on the CT Scan if we will consider having Dr. Romona Curls secretary/Assistant will call to arrange for right SI joint injection steroid . Marland Kitchen  Injection with steroid may be of benefit. Hemp CBD capsules, [email or web address] 5,000-7,000 mg per bottle, 60 capsules per bottle, take one capsule twice a day.  Follow-Up Instructions: Return in about 3 weeks (around 12/03/2019).   Orders:  No orders of the defined types were placed in this encounter.  No orders of the defined types were placed in this encounter.     Procedures: No procedures performed   Clinical Data: Findings:  CLINICAL DATA:  74 year old female with lumbar back pain, right greater than left buttock pain since the most recent spine surgery in April 2018.  EXAM: CT LUMBAR SPINE WITHOUT CONTRAST  TECHNIQUE: Multidetector CT imaging of the lumbar spine was performed without intravenous contrast administration. Multiplanar CT  image reconstructions were also generated.  COMPARISON:  Lumbar radiographs 03/21/2017. Intraoperative images [id number]. Lumbar MRI 02/13/2016, and earlier.  FINDINGS: Segmentation: Normal, which is the same numbering system on the 2017 MRI.  Alignment: Stable vertebral height and alignment since 2017. Mild levoconvex lumbar curvature.  Vertebrae: Osteopenia. No acute osseous abnormality identified. Intact visible sacrum and SI joints. Postoperative changes are detailed below. No superimposed acute osseous abnormality identified.  Paraspinal and other soft tissues: Bilateral nephrolithiasis. Gonadal vein phleboliths and iliac artery calcifications in the visible pelvis. Surgically absent gallbladder. postoperative changes to the posterior paraspinal soft tissues common no definite postoperative fluid collection.  Disc levels:  T12-L1:  Minor disc bulge.  No stenosis.  L1-L2:  Mild facet hypertrophy.  No significant stenosis.  L2-L3: Mild circumferential disc bulge (series 4, image 29). Mild to moderate facet hypertrophy. Borderline to mild spinal and bilateral L2 foraminal stenosis.  L3-L4: Severe disc space loss with vacuum disc. Subtle anterolisthesis. Circumferential disc osteophyte complex with broad-based posterior component. Severe facet hypertrophy. There is trace vacuum facet on the left (series 5, image 38). However, there does appear to be developing posterior element arthrodesis at this level (E.g. Sagittal image 41 on the left). Interval partial laminectomy at this level. Evidence of improved thecal sac patency. Residual moderate bilateral L3 neural foraminal stenosis.  L4-L5: Chronic decompression and fusion with solid interbody and posterior element arthrodesis. Bilateral L4 an unilateral right L5  pedicle screws appear intact without loosening. Interbody implant in place. No stenosis.  L5-S1: Interval decompression and fusion with left of  midline interbody implant which may be partially retropulsed (series 5, image 56 and sagittal image 35). Note also what appears to be new bulky anterior dystrophic calcification at the L5-S1 disc space and anterior L5 vertebra (sagittal image 31). Vacuum phenomena along the S1 superior endplate. Bilateral S1 pedicle screws are in place but demonstrates surrounding lucency worse on the left (coronal image 32). Moderate to severe residual facet hypertrophy. No posterior element arthrodesis identified. Possible left lateral recess and left L5 neural foraminal stenosis.  IMPRESSION: 1. Chronic L4-L5 decompression and fusion with solid arthrodesis. 2. Interval L5-S1 decompression and fusion. No arthrodesis identified with vacuum phenomena in the interbody space. Both S1 pedicle screws show evidence of loosening. Some retropulsion of the interbody implant toward the left lateral recess. And new anterior dystrophic calcification/ossification which might reflect anterior extrusion of some of the interbody material. 3. Chronic adjacent segment disease at L3-L4 with severe disc space loss but evidence of bilateral posterior element arthrodesis, and interval laminectomy with suspected improved thecal sac patency. 4. Up to mild multifactorial spinal and foraminal stenosis at L2-L3. 5. Nephrolithiasis.   Electronically Signed   By: Genevie Ann M.D.   On: 04/03/2017 13:07    Subjective: Chief Complaint  Patient presents with  . Lower Back - Follow-up    She had a Bilateral L2-3 TF injection with Dr. Ernestina Patches on 10/26/2019, she states that she is 75% better and she had no pain on the left side, and she still has some pain on the right side at the hip    74 year old female with history of spinal fusion about 2 years ago L4-S1 with new disc changes and protrusion at the L2-3 level. She is better following ESI's and reports that the left back pain is much better. She has some persistent pain right  lumbar area about the lumbosacral junction. There is less pain by about 75% and she is not really having left sided pain. Mainly on the right side.     Review of Systems  Constitutional: Negative.  Negative for activity change, appetite change, chills, diaphoresis, fatigue, fever and unexpected weight change.  HENT: Negative.  Negative for congestion, dental problem, drooling, ear discharge, ear pain, facial swelling, hearing loss, mouth sores, nosebleeds, postnasal drip, rhinorrhea, sinus pressure, sinus pain, sneezing, sore throat, tinnitus, trouble swallowing and voice change.   Eyes: Negative.  Negative for photophobia, pain, discharge, redness, itching and visual disturbance.  Respiratory: Negative.  Negative for apnea, cough, choking, chest tightness, shortness of breath, wheezing and stridor.   Cardiovascular: Negative.  Negative for chest pain, palpitations and leg swelling.  Gastrointestinal: Positive for nausea and vomiting (Trulicity with nausea for one week and still able to take.). Negative for abdominal distention, abdominal pain, anal bleeding, blood in stool, constipation, diarrhea and rectal pain.  Endocrine: Negative.  Negative for cold intolerance, heat intolerance, polydipsia, polyphagia and polyuria.  Genitourinary: Negative for difficulty urinating, dyspareunia, dysuria and enuresis.  Musculoskeletal: Positive for back pain. Negative for arthralgias, gait problem, joint swelling, myalgias, neck pain and neck stiffness.  Skin: Negative.  Negative for color change, pallor, rash and wound.  Neurological: Negative for dizziness, tremors, seizures, syncope, facial asymmetry, speech difficulty, weakness, light-headedness, numbness and headaches.  Hematological: Negative for adenopathy.  Psychiatric/Behavioral: Negative.      Objective: Vital Signs: BP (!) 181/83 (BP Location: Left Arm, Patient Position:  Sitting)   Pulse 64   Ht 5\' 4"  (1.626 m)   Wt 180 lb (81.6 kg)   BMI  30.90 kg/m   Physical Exam Constitutional:      Appearance: She is well-developed.  HENT:     Head: Normocephalic and atraumatic.  Eyes:     Pupils: Pupils are equal, round, and reactive to light.  Pulmonary:     Effort: Pulmonary effort is normal.     Breath sounds: Normal breath sounds.  Abdominal:     General: Bowel sounds are normal.     Palpations: Abdomen is soft.  Musculoskeletal:     Cervical back: Normal range of motion and neck supple.     Lumbar back: Negative right straight leg raise test and negative left straight leg raise test.  Skin:    General: Skin is warm and dry.  Neurological:     Mental Status: She is alert and oriented to person, place, and time.  Psychiatric:        Behavior: Behavior normal.        Thought Content: Thought content normal.        Judgment: Judgment normal.     Back Exam   Tenderness  The patient is experiencing tenderness in the lumbar.  Range of Motion  Extension: normal  Flexion: abnormal  Lateral bend right: normal  Lateral bend left: normal  Rotation right: normal  Rotation left: normal   Muscle Strength  Right Quadriceps:  5/5  Left Quadriceps:  5/5  Right Hamstrings:  5/5  Left Hamstrings:  5/5   Tests  Straight leg raise right: negative Straight leg raise left: negative  Reflexes  Patellar: normal Achilles: normal Biceps: normal Babinski's sign: normal   Other  Toe walk: normal Heel walk: normal Sensation: normal Gait: normal  Erythema: no back redness Scars: absent      Specialty Comments:  No specialty comments available.  Imaging: No results found.   PMFS History: Patient Active Problem List   Diagnosis Date Noted  . Anemia due to blood loss 11/02/2016    Priority: High    Class: Acute  . Spondylolisthesis, lumbar region 10/30/2016    Priority: High    Class: Chronic  . Spinal stenosis of lumbar region 10/30/2016    Priority: High    Class: Chronic  . Retained orthopedic  hardware 10/30/2016    Priority: High    Class: Chronic  . Right-sided thoracic back pain 07/01/2019  . Hyperlipidemia 07/17/2017  . History of lumbar spinal fusion   . History of fusion of lumbar spine   . Lethargy 10/31/2016  . AKI (acute kidney injury) ([place]) 10/31/2016  . Chronic diastolic CHF (congestive heart failure) ([place]) 10/31/2016  . Cough   . Leukocytosis   . Spinal stenosis of lumbar region with neurogenic claudication 10/30/2016  . Genetic testing 09/14/2015  . Atypical ductal hyperplasia of left breast 08/24/2015  . Family history of breast cancer   . Family history of prostate cancer   . Family history of stomach cancer   . Abnormal nuclear stress test 12/29/2014  . Cervical spondylosis with myelopathy and radiculopathy 06/11/2014  . Carpal tunnel syndrome 05/14/2014  . Aftercare following surgery of the circulatory system, [place] 03/11/2014  . Pain of right lower extremity 03/11/2014  . Atypical lobular hyperplasia of left breast 02/12/2014  . Fibromyalgia 09/21/2013  . Carotid artery disease ([place]) 02/29/2012  . Back abscess 01/22/2012  . Chest pain with moderate risk of acute coronary syndrome  12/09/2009  . Shortness of breath 06/10/2009  . Orthostatic hypotension 01/31/2009  . CAD (coronary artery disease) 12/23/2008  . Normocytic anemia 07/21/2008  . Headache(784.0) 07/21/2008  . History of TIA (transient ischemic attack) 07/21/2008  . Edema 02/27/2008  . Diabetes mellitus with complication ([place]) 99991111  . Depression 07/16/2007  . Dyslipidemia 02/21/2007  . Essential hypertension 02/21/2007  . ASTHMA 02/21/2007  . GERD 02/21/2007  . LOW BACK PAIN 02/21/2007   Past Medical History:  Diagnosis Date  . Abnormal nuclear stress test 12/29/2014  . Allergy   . Anemia, unspecified   . Arthritis   . Blood transfusion without reported diagnosis    2018  . CAD (coronary artery disease)    sees. Dr. Burt Knack. s/p cath with PCI of RCA (xience des) June 2010. Pt  also with LAD and D2 dzs-NL FFR in both areas med Rx  . Carotid artery occlusion   . Cataract    sees Dr. Herbert Deaner   . Depressive disorder, not elsewhere classified   . Duodenal nodule   . Edema   . Fibromyalgia   . Gastric polyps    COLON  . GERD (gastroesophageal reflux disease)   . Glaucoma    narrow angle, sees Dr. Herbert Deaner   . Headache(784.0)   . Hearing loss    left ear  . Heart murmur    questionable  . Hemorrhoids   . HSV (herpes simplex virus) anogenital infection 05/2014  . HTN (hypertension)   . Hyperlipidemia   . Low back pain    she sees Dr. Louanne Skye, also Dr. Ernestina Patches for pain medications and Benjiman Core PA for injections   . Obesity   . Osteopenia 01/2018   T score -1.6 FRAX 7.1% / 1.4%  . Peripheral vascular disease ([place])   . Stroke [facility])    TIA  "many yrs ago"    . TIA (transient ischemic attack)    also Hx of it.   . Type II or unspecified type diabetes mellitus without mention of complication, not stated as uncontrolled    sees Dr. Carrolyn Meiers     Family History  Problem Relation Age of Onset  . Hypertension Mother   . Hypertension Sister   . Hyperlipidemia Sister   . Hypertension Brother   . Cancer Brother        PROSTATE/LUNG  . Diabetes Brother   . Heart disease Brother        Before age 63 - Bypass  . Varicose Veins Brother   . Cancer Father        Prostate and pancreatic   . Stomach cancer Maternal Grandmother 73  . Heart disease Maternal Grandfather   . Breast cancer Maternal Aunt 21  . Cancer Maternal Aunt        Stomach  . Breast cancer Cousin 78       maternal first cousin  . Prostate cancer Cousin 29  . Breast cancer Cousin 73  . Esophageal cancer Cousin   . Breast cancer Cousin 40       maternal first cousin's daughter  . Coronary artery disease Neg Hx        premature CAD  . Colon cancer Neg Hx   . Colon polyps Neg Hx   . Rectal cancer Neg Hx     Past Surgical History:  Procedure Laterality Date  . ABDOMINAL HYSTERECTOMY  age  40   TAH and USO  Leiomyomata  . ANTERIOR CERVICAL CORPECTOMY N/A 06/11/2014  Procedure: ANTERIOR CERVICAL CORPECTOMY CERVICAL SIX; WITH CERVICAL FIVE TO CERVICAL SEVEN ARTHRODESIS;  Surgeon: Ashok Pall, MD;  Location: [facility];  Service: Neurosurgery;  Laterality: N/A;  . BACK SURGERY  2008   lumb fusion  . BREAST BIOPSY Left 01/26/2014   Procedure: EXCISION LEFT BREAST MASS;  Surgeon: Adin Hector, MD;  Location: [facility];  Service: General;  Laterality: Left;  . BREAST LUMPECTOMY WITH RADIOACTIVE SEED LOCALIZATION Left 07/24/2017   Procedure: LEFT BREAST LUMPECTOMY WITH RADIOACTIVE SEED LOCALIZATION ERAS;  Surgeon: Erroll Luna, MD;  Location: [facility];  Service: General;  Laterality: Left;  . CARDIAC CATHETERIZATION    . CARDIAC CATHETERIZATION N/A 12/29/2014   Procedure: Left Heart Cath and Coronary Angiography;  Surgeon: Sherren Mocha, MD;  Location: [facility];  Service: Cardiovascular;  Laterality: N/A;  . CAROTID ENDARTERECTOMY  march 2012   per Dr. Morton Amy  . CARPAL TUNNEL RELEASE Left 05/14/2014   Procedure: Left Carpal tunnel release;  Surgeon: Ashok Pall, MD;  Location: [facility];  Service: Neurosurgery;  Laterality: Left;  Left Carpal tunnel release  . CARPAL TUNNEL RELEASE Bilateral   . CHOLECYSTECTOMY    . COLONOSCOPY  06/26/2017   per Dr. Henrene Pastor, adenomatous polyp, repeat in 5 yrs    . CORONARY STENT PLACEMENT     Dr. Burt Knack  . DILATION AND CURETTAGE OF UTERUS    . ESOPHAGOGASTRODUODENOSCOPY  02-08-06   per Dr. Henrene Pastor, gastritis   . EYE SURGERY Left June 2016   Cataract  . GLAUCOMA SURGERY     with laser, per Dr. Henrene Pastor   . HARDWARE REMOVAL  10/30/2016   Procedure: HARDWARE REMOVAL;  Surgeon: Jessy Oto, MD;  Location: [facility];  Service: Orthopedics;;  . LUMBAR FUSION  2008  . POLYPECTOMY    . POSTERIOR LUMBAR FUSION  10/30/2016   Removal of hardware rods and pedicle screws lumbar four, Extension of  fusion L4-5 to L5-S1 with reinsertion of screws at L 4, left L5-S1 transforaminal lumbar interbody fusion, Exploration  left L5 nerve root, bilateral decompressive laminectomy L3-4/notes 10/30/2016  . ULNAR NERVE TRANSPOSITION Left 05/14/2014   Procedure: Left Ulnar nerve decompression;  Surgeon: Ashok Pall, MD;  Location: [facility];  Service: Neurosurgery;  Laterality: Left;  Left Ulnar nerve decompression   Social History   Occupational History  . Not on file  Tobacco Use  . Smoking status: Former Smoker    Quit date: 01/22/1967    Years since quitting: 52.8  . Smokeless tobacco: Never Used  Substance and Sexual Activity  . Alcohol use: No    Alcohol/week: 0.0 standard drinks  . Drug use: No  . Sexual activity: Never    Birth control/protection: Surgical, Post-menopausal    Comment: HYST-1st intercourse 38 yo-5 partners

## 2019-11-12 NOTE — Patient Instructions (Addendum)
Avoid bending, stooping and avoid lifting weights greater than 10 lbs. Avoid prolong standing and walking. Avoid frequent bending and stooping  No lifting greater than 10 lbs. May use ice or moist heat for pain. Weight loss is of benefit. Handicap license is approved. Will obtain CT scan of the lumbar spine as the study done in 2019 suggested delayed healing of the L5-S1 fusion site. We will decide based on the CT Scan if we will consider having Dr. Romona Curls secretary/Assistant will call to arrange for right SI joint injection steroid . Marland Kitchen  Injection with steroid may be of benefit. Hemp CBD capsules, [email or web address] 5,000-7,000 mg per bottle, 60 capsules per bottle, take one capsule twice a day.   Follow-Up Instructions: No follow-ups on file.

## 2019-11-24 DIAGNOSIS — E1159 Type 2 diabetes mellitus with other circulatory complications: Secondary | ICD-10-CM | POA: Diagnosis not present

## 2019-11-24 DIAGNOSIS — I10 Essential (primary) hypertension: Secondary | ICD-10-CM | POA: Diagnosis not present

## 2019-11-24 DIAGNOSIS — Z794 Long term (current) use of insulin: Secondary | ICD-10-CM | POA: Diagnosis not present

## 2019-11-24 DIAGNOSIS — E7849 Other hyperlipidemia: Secondary | ICD-10-CM | POA: Diagnosis not present

## 2019-11-24 DIAGNOSIS — N1831 Chronic kidney disease, stage 3a: Secondary | ICD-10-CM | POA: Diagnosis not present

## 2019-11-27 ENCOUNTER — Ambulatory Visit
Admission: RE | Admit: 2019-11-27 | Discharge: 2019-11-27 | Disposition: A | Discharge status: Home / Self Care | Payer: Medicare Other | Source: Ambulatory Visit | Attending: Hematology | Admitting: Hematology

## 2019-11-27 ENCOUNTER — Other Ambulatory Visit: Payer: Self-pay

## 2019-11-27 DIAGNOSIS — R928 Other abnormal and inconclusive findings on diagnostic imaging of breast: Secondary | ICD-10-CM

## 2019-11-27 DIAGNOSIS — N6310 Unspecified lump in the right breast, unspecified quadrant: Secondary | ICD-10-CM

## 2019-11-27 DIAGNOSIS — Z1231 Encounter for screening mammogram for malignant neoplasm of breast: Secondary | ICD-10-CM

## 2019-11-27 DIAGNOSIS — N6489 Other specified disorders of breast: Secondary | ICD-10-CM | POA: Diagnosis not present

## 2019-11-27 DIAGNOSIS — N6092 Unspecified benign mammary dysplasia of left breast: Secondary | ICD-10-CM

## 2019-11-27 MED ORDER — GADOBUTROL 1 MMOL/ML IV SOLN
8.0000 mL | Freq: Once | INTRAVENOUS | Status: AC | PRN
  Administered 2019-11-27: 8 mL via INTRAVENOUS

## 2019-11-28 ENCOUNTER — Other Ambulatory Visit: Payer: Self-pay | Admitting: Physician Assistant

## 2019-11-30 ENCOUNTER — Telehealth: Payer: Self-pay

## 2019-11-30 NOTE — Telephone Encounter (Signed)
[name] called wanting to speak with Dr. Burr Medico regarding her MRI results that she viewed on [place].  I did tell her that Dr. Burr Medico has ordered biopsies of those areas.  I told her Dr. Burr Medico would call her on 12/01/2019

## 2019-11-30 NOTE — Telephone Encounter (Signed)
Pt sees Dr. Oval Linsey now. Please address

## 2019-12-01 ENCOUNTER — Other Ambulatory Visit: Payer: Self-pay

## 2019-12-01 ENCOUNTER — Ambulatory Visit
Admission: RE | Admit: 2019-12-01 | Discharge: 2019-12-01 | Disposition: A | Discharge status: Home / Self Care | Payer: Medicare Other | Source: Ambulatory Visit | Attending: Specialist | Admitting: Specialist

## 2019-12-01 DIAGNOSIS — M533 Sacrococcygeal disorders, not elsewhere classified: Secondary | ICD-10-CM | POA: Diagnosis not present

## 2019-12-01 DIAGNOSIS — Z4889 Encounter for other specified surgical aftercare: Secondary | ICD-10-CM

## 2019-12-01 DIAGNOSIS — M48061 Spinal stenosis, lumbar region without neurogenic claudication: Secondary | ICD-10-CM | POA: Diagnosis not present

## 2019-12-01 NOTE — Telephone Encounter (Signed)
I have called pt, explained her recent MRI results, and recommended both right and left breast lesion biopsy. She agrees, please let breast cancer call her and schedule it. Thanks   Truitt Merle MD

## 2019-12-04 ENCOUNTER — Other Ambulatory Visit: Payer: Self-pay | Admitting: Hematology

## 2019-12-04 DIAGNOSIS — R928 Other abnormal and inconclusive findings on diagnostic imaging of breast: Secondary | ICD-10-CM

## 2019-12-04 DIAGNOSIS — N6310 Unspecified lump in the right breast, unspecified quadrant: Secondary | ICD-10-CM

## 2019-12-04 DIAGNOSIS — N6092 Unspecified benign mammary dysplasia of left breast: Secondary | ICD-10-CM

## 2019-12-16 ENCOUNTER — Ambulatory Visit
Admission: RE | Admit: 2019-12-16 | Discharge: 2019-12-16 | Disposition: A | Discharge status: Home / Self Care | Payer: Medicare Other | Source: Ambulatory Visit | Attending: Hematology | Admitting: Hematology

## 2019-12-16 ENCOUNTER — Other Ambulatory Visit: Payer: Self-pay | Admitting: Body Imaging

## 2019-12-16 ENCOUNTER — Other Ambulatory Visit: Payer: Self-pay

## 2019-12-16 DIAGNOSIS — N6312 Unspecified lump in the right breast, upper inner quadrant: Secondary | ICD-10-CM | POA: Diagnosis not present

## 2019-12-16 DIAGNOSIS — N6311 Unspecified lump in the right breast, upper outer quadrant: Secondary | ICD-10-CM | POA: Diagnosis not present

## 2019-12-16 DIAGNOSIS — R928 Other abnormal and inconclusive findings on diagnostic imaging of breast: Secondary | ICD-10-CM

## 2019-12-16 DIAGNOSIS — N6321 Unspecified lump in the left breast, upper outer quadrant: Secondary | ICD-10-CM | POA: Diagnosis not present

## 2019-12-16 DIAGNOSIS — N6092 Unspecified benign mammary dysplasia of left breast: Secondary | ICD-10-CM

## 2019-12-16 DIAGNOSIS — N6310 Unspecified lump in the right breast, unspecified quadrant: Secondary | ICD-10-CM

## 2019-12-16 DIAGNOSIS — N6320 Unspecified lump in the left breast, unspecified quadrant: Secondary | ICD-10-CM | POA: Diagnosis not present

## 2019-12-16 DIAGNOSIS — D0512 Intraductal carcinoma in situ of left breast: Secondary | ICD-10-CM | POA: Diagnosis not present

## 2019-12-16 DIAGNOSIS — N6325 Unspecified lump in the left breast, overlapping quadrants: Secondary | ICD-10-CM | POA: Diagnosis not present

## 2019-12-16 DIAGNOSIS — N6011 Diffuse cystic mastopathy of right breast: Secondary | ICD-10-CM | POA: Diagnosis not present

## 2019-12-16 MED ORDER — GADOBUTROL 1 MMOL/ML IV SOLN
8.0000 mL | Freq: Once | INTRAVENOUS | Status: AC | PRN
  Administered 2019-12-16: 8 mL via INTRAVENOUS

## 2019-12-17 ENCOUNTER — Other Ambulatory Visit: Payer: Self-pay | Admitting: Physician Assistant

## 2019-12-17 MED ORDER — ISOSORBIDE MONONITRATE ER 60 MG PO TB24: 60.0000 mg | ORAL_TABLET | Freq: Every day | ORAL | 0 refills | Status: DC

## 2019-12-17 NOTE — Telephone Encounter (Signed)
*  STAT* If patient is at the pharmacy, call can be transferred to refill team.   1. Which medications need to be refilled? (please list name of each medication and dose if known)  Irbesartan2. Which pharmacy/location (including street and city if local pharmacy) is medication to be sent to? [address] [place]  3. Do they need a 30 day or 90 day supply? need enough until her appt on 01-26-20

## 2019-12-17 NOTE — Telephone Encounter (Signed)
Pt's medication was sent to pt's pharmacy as requested. Confirmation received.  °

## 2019-12-21 ENCOUNTER — Telehealth: Payer: Self-pay | Admitting: Hematology

## 2019-12-21 ENCOUNTER — Ambulatory Visit: Payer: Self-pay | Admitting: Surgery

## 2019-12-21 DIAGNOSIS — D0512 Intraductal carcinoma in situ of left breast: Secondary | ICD-10-CM

## 2019-12-21 NOTE — Telephone Encounter (Signed)
Scheduled appt per 6/11 sch message - pt is aware of appt added.   

## 2019-12-21 NOTE — H&P (View-Only) (Signed)
[name] Appointment: 12/21/2019 9:20 AM Location: [facility] Patient #: [id number] DOB: May 24, 1946 Married / Language: English / Race: Black or African American Female  History of Present Illness [name]; 12/21/2019 3:55 PM) Patient words: Diagnosis 1. Breast, right, needle core biopsy, upper inner - FIBROCYSTIC CHANGES. - NO MALIGNANCY IDENTIFIED. 2. Breast, right, needle core biopsy, lateral - FIBROCYSTIC CHANGES WITH FOCAL ADENOSIS. - NO MALIGNANCY IDENTIFIED. 3. Breast, left, needle core biopsy, lateral - DUCTAL CARCINOMA IN SITU, INTERMEDIATE GRADE. - SEE MICROSCOPIC DESCRIPTION    Patient sent for evaluation of abnormal mammogram. She is found to have left breast microcalcifications. Core biopsy of a 3 cm area showed intermediate grade DCIS. Patient denies any history of breast pain, nipple discharge changes in her breast.  The patient is a 74 year old female.   Past Surgical History (Chanel Teressa Senter, [place]; 12/21/2019 9:50 AM) Foot Surgery Bilateral. Gallbladder Surgery - Laparoscopic Hysterectomy (not due to cancer) - Partial  Diagnostic Studies History (Chanel Teressa Senter, CMA; 12/21/2019 9:50 AM) Colonoscopy 1-5 years ago within last year Mammogram within last year Pap Smear 1-5 years ago  Allergies ([place], [place]; 12/21/2019 9:50 AM) No Known Drug Allergies [07/15/2017]: Allergies Reconciled  Medication History (Chanel Teressa Senter, CMA; 12/21/2019 9:50 AM) ProAir HFA (108 (90 Base)MCG/ACT Aerosol Soln, Inhalation) Active. Aspirin (81MG  Tablet, Oral) Active. Diclofenac Sodium (1% Gel, External) Active. Vitamin D2 (Oral) Specific strength unknown - Active. Furosemide (40MG  Tablet, Oral) Active. HydrALAZINE HCl (100MG  Tablet, Oral) Active. HumaLOG KwikPen (100UNIT/ML Soln Pen-inj, Subcutaneous) Active. HumaLOG (100UNIT/ML Solution, Subcutaneous) Active. Irbesartan (300MG  Tablet, Oral) Active. Isosorbide Mononitrate ER  (60MG  Tablet ER 24HR, Oral) Active. Latanoprost (0.005% Solution, Ophthalmic) Active. Methyldopa (500MG  Tablet, Oral) Active. Metoprolol Succinate ER (100MG  Tablet ER 24HR, Oral) Active. Montelukast Sodium (10MG  Tablet, Oral) Active. Nitrostat (0.4MG  Tab Sublingual, Sublingual) Active. Pantoprazole Sodium (40MG  Tablet DR, Oral) Active. Rosuvastatin Calcium (40MG  Tablet, Oral) Active. Spironolactone (25MG  Tablet, Oral) Active. TraMADol HCl (50MG  Tablet, Oral) Active. Medications Reconciled  Social History (Chanel Teressa Senter, CMA; 12/21/2019 9:50 AM) No alcohol use No caffeine use No drug use Tobacco use Former smoker.  Family History Antonietta Jewel, [place]; 12/21/2019 9:50 AM) Arthritis Mother. Diabetes Mellitus Brother, Family Members In General. Hypertension Brother, Family Members In [place], Mother, Sister. Malignant Neoplasm Of Pancreas Brother. Prostate Cancer Brother. Thyroid problems Sister.  Pregnancy / Birth History Antonietta Jewel, [place]; 12/21/2019 9:50 AM) Age at menarche 18 years.  Other Problems (Chanel Teressa Senter, [place]; 12/21/2019 9:50 AM) Back Pain Breast Cancer Cerebrovascular Accident Diabetes Mellitus Gastroesophageal Reflux Disease Heart murmur High blood pressure Lump In Breast Transfusion history     Review of Systems (Chanel Nolan CMA; 12/21/2019 9:50 AM) General Not Present- Appetite Loss, Chills, Fatigue, Fever, Night Sweats, Weight Gain and Weight Loss. Skin Present- Dryness. Not Present- Change in Wart/Mole, Hives, Jaundice, New Lesions, Non-Healing Wounds, Rash and Ulcer. HEENT Present- Hearing Loss and Nose Bleed. Not Present- Earache, Hoarseness, Oral Ulcers, Ringing in the Ears, Seasonal Allergies, Sinus Pain, Sore Throat, Visual Disturbances, Wears glasses/contact lenses and Yellow Eyes. Respiratory Not Present- Bloody sputum, Chronic Cough, Difficulty Breathing, Snoring and Wheezing. Breast Present- Breast Mass. Not Present-  Breast Pain, Nipple Discharge and Skin Changes. Cardiovascular Present- Leg Cramps and Swelling of Extremities. Not Present- Chest Pain, Difficulty Breathing Lying Down, Palpitations, Rapid Heart Rate and Shortness of Breath. Gastrointestinal Not Present- Abdominal Pain, Bloating, Bloody Stool, Change in Bowel Habits, Chronic diarrhea, Constipation, Difficulty Swallowing, Excessive gas, Gets full quickly at meals, Hemorrhoids, Indigestion, Nausea, Rectal  Pain and Vomiting. Female Genitourinary Not Present- Frequency, Nocturia, Painful Urination, Pelvic Pain and Urgency. Musculoskeletal Present- Back Pain and Joint Stiffness. Not Present- Joint Pain, Muscle Pain, Muscle Weakness and Swelling of Extremities. Neurological Present- Trouble walking. Not Present- Decreased Memory, Fainting, Headaches, Numbness, Seizures, Tingling, Tremor and Weakness. Psychiatric Present- Depression. Not Present- Anxiety, Bipolar, Change in Sleep Pattern, Fearful and Frequent crying. Endocrine Present- Hair Changes. Not Present- Cold Intolerance, Excessive Hunger, Heat Intolerance, Hot flashes and New Diabetes. Hematology Not Present- Blood Thinners, Easy Bruising, Excessive bleeding, Gland problems, HIV and Persistent Infections.  Vitals (Chanel Nolan CMA; 12/21/2019 9:50 AM) 12/21/2019 9:50 AM Weight: 181.38 lb Height: 64in Body Surface Area: 1.88 m Body Mass Index: 31.13 kg/m  Temp.: 97.33F  Pulse: 67 (Regular)         Physical Exam (Hildur Bayer A. Deitrich Steve [name]; 12/21/2019 3:55 PM)  General Mental Status-Alert. General Appearance-Consistent with stated age. Hydration-Well hydrated. Voice-Normal.  Head and Neck Head-normocephalic, atraumatic with no lesions or palpable masses. Trachea-midline. Thyroid Gland Characteristics - normal size and consistency.  Eye Eyeball - Bilateral-Extraocular movements intact. Sclera/Conjunctiva - Bilateral-No scleral icterus.  Chest and Lung  Exam Chest and lung exam reveals -quiet, even and easy respiratory effort with no use of accessory muscles and on auscultation, normal breath sounds, no adventitious sounds and normal vocal resonance. Inspection Chest Wall - Normal. Back - normal.  Breast Breast - Left-Symmetric, Non Tender, No Biopsy scars, no Dimpling - Left, No Inflammation, No Lumpectomy scars, No Mastectomy scars, No Peau d' Orange. Breast - Right-Symmetric, Non Tender, No Biopsy scars, no Dimpling - Right, No Inflammation, No Lumpectomy scars, No Mastectomy scars, No Peau d' Orange. Breast Lump-No Palpable Breast Mass.  Cardiovascular Cardiovascular examination reveals -normal heart sounds, regular rate and rhythm with no murmurs and normal pedal pulses bilaterally.  Abdomen Inspection Inspection of the abdomen reveals - No Hernias. Skin - Scar - no surgical scars. Palpation/Percussion Palpation and Percussion of the abdomen reveal - Soft, Non Tender, No Rebound tenderness, No Rigidity (guarding) and No hepatosplenomegaly. Auscultation Auscultation of the abdomen reveals - Bowel sounds normal.  Neurologic Neurologic evaluation reveals -alert and oriented x 3 with no impairment of recent or remote memory. Mental Status-Normal.  Musculoskeletal Normal Exam - Left-Upper Extremity Strength Normal and Lower Extremity Strength Normal. Normal Exam - Right-Upper Extremity Strength Normal and Lower Extremity Strength Normal.  Lymphatic Head & Neck  General Head & Neck Lymphatics: Bilateral - Description - Normal. Axillary  General Axillary Region: Bilateral - Description - Normal. Tenderness - Non Tender. Femoral & Inguinal  Generalized Femoral & Inguinal Lymphatics: Bilateral - Description - Normal. Tenderness - Non Tender.    Assessment & Plan (Betsy Rosello A. Redina Zeller [name]; 12/21/2019 3:57 PM)  BREAST NEOPLASM, TIS (DCIS), LEFT (D05.12) Impression: Discuss lumpectomy versus COMET trial. She had a  lumpectomy 2 years ago which showed atypical ductal hyperplasia and was on trial of tamoxifen which she did not tolerate and has no interest in pursuing medical management of that. She has opted for lumpectomy with possible postoperative radiation therapy. Risk of lumpectomy include bleeding, infection, seroma, more surgery, use of seed/wire, wound care, cosmetic deformity and the need for other treatments, death , blood clots, death. Pt agrees to proceed. Total time 45 minutes  Current Plans Pt Education - [facility] Breast Cancer Information Given - Alight "Breast Journey" Package You are being scheduled for surgery- Our schedulers will call you.  You should hear from our office's scheduling department within 5 working days about  the location, date, and time of surgery. We try to make accommodations for patient's preferences in scheduling surgery, but sometimes the OR schedule or the surgeon's schedule prevents [place] from making those accommodations.  If you have not heard from our office [phone]) in 5 working days, call the office and ask for your surgeon's nurse.  If you have other questions about your diagnosis, plan, or surgery, call the office and ask for your surgeon's nurse.  Pt Education - [facility] Breast Biopsy HCI: discussed with patient and provided information. We discussed the staging and pathophysiology of breast cancer. We discussed all of the different options for treatment for breast cancer including surgery, chemotherapy, radiation therapy, Herceptin, and antiestrogen therapy. We discussed a sentinel lymph node biopsy as she does not appear to having lymph node involvement right now. We discussed the performance of that with injection of radioactive tracer and blue dye. We discussed that she would have an incision underneath her axillary hairline. We discussed that there is a bout a 10-20% chance of having a positive node with a sentinel lymph node biopsy and we will await the permanent  pathology to make any other first further decisions in terms of her treatment. One of these options might be to return to the operating room to perform an axillary lymph node dissection. We discussed about a 1-2% risk lifetime of chronic shoulder pain as well as lymphedema associated with a sentinel lymph node biopsy. We discussed the options for treatment of the breast cancer which included lumpectomy versus a mastectomy. We discussed the performance of the lumpectomy with a wire placement. We discussed a 10-20% chance of a positive margin requiring reexcision in the operating room. We also discussed that she may need radiation therapy or antiestrogen therapy or both if she undergoes lumpectomy. We discussed the mastectomy and the postoperative care for that as well. We discussed that there is no difference in her survival whether she undergoes lumpectomy with radiation therapy or antiestrogen therapy versus a mastectomy. There is a slight difference in the local recurrence rate being 3-5% with lumpectomy and about 1% with a mastectomy. We discussed the risks of operation including bleeding, infection, possible reoperation. She understands her further therapy will be based on what her stages at the time of her operation.

## 2019-12-21 NOTE — H&P (Signed)
[name] Appointment: 12/21/2019 9:20 AM Location: [facility] Patient #: [id number] DOB: 02-01-1946 Married / Language: English / Race: Black or African American Female  History of Present Illness Karen Moores A. Karen Meller MD; 12/21/2019 3:55 PM) Patient words: Diagnosis 1. Breast, right, needle core biopsy, upper inner - FIBROCYSTIC CHANGES. - NO MALIGNANCY IDENTIFIED. 2. Breast, right, needle core biopsy, lateral - FIBROCYSTIC CHANGES WITH FOCAL ADENOSIS. - NO MALIGNANCY IDENTIFIED. 3. Breast, left, needle core biopsy, lateral - DUCTAL CARCINOMA IN SITU, INTERMEDIATE GRADE. - SEE MICROSCOPIC DESCRIPTION    Patient sent for evaluation of abnormal mammogram. She is found to have left breast microcalcifications. Core biopsy of a 3 cm area showed intermediate grade DCIS. Patient denies any history of breast pain, nipple discharge changes in her breast.  The patient is a 74 year old female.   Past Surgical History (Chanel Teressa Senter, [place]; 12/21/2019 9:50 AM) Foot Surgery Bilateral. Gallbladder Surgery - Laparoscopic Hysterectomy (not due to cancer) - Partial  Diagnostic Studies History (Chanel Teressa Senter, CMA; 12/21/2019 9:50 AM) Colonoscopy 1-5 years ago within last year Mammogram within last year Pap Smear 1-5 years ago  Allergies ([place], [place]; 12/21/2019 9:50 AM) No Known Drug Allergies [07/15/2017]: Allergies Reconciled  Medication History (Chanel Teressa Senter, CMA; 12/21/2019 9:50 AM) ProAir HFA (108 (90 Base)MCG/ACT Aerosol Soln, Inhalation) Active. Aspirin (81MG  Tablet, Oral) Active. Diclofenac Sodium (1% Gel, External) Active. Vitamin D2 (Oral) Specific strength unknown - Active. Furosemide (40MG  Tablet, Oral) Active. HydrALAZINE HCl (100MG  Tablet, Oral) Active. HumaLOG KwikPen (100UNIT/ML Soln Pen-inj, Subcutaneous) Active. HumaLOG (100UNIT/ML Solution, Subcutaneous) Active. Irbesartan (300MG  Tablet, Oral) Active. Isosorbide Mononitrate ER  (60MG  Tablet ER 24HR, Oral) Active. Latanoprost (0.005% Solution, Ophthalmic) Active. Methyldopa (500MG  Tablet, Oral) Active. Metoprolol Succinate ER (100MG  Tablet ER 24HR, Oral) Active. Montelukast Sodium (10MG  Tablet, Oral) Active. Nitrostat (0.4MG  Tab Sublingual, Sublingual) Active. Pantoprazole Sodium (40MG  Tablet DR, Oral) Active. Rosuvastatin Calcium (40MG  Tablet, Oral) Active. Spironolactone (25MG  Tablet, Oral) Active. TraMADol HCl (50MG  Tablet, Oral) Active. Medications Reconciled  Social History (Chanel Teressa Senter, CMA; 12/21/2019 9:50 AM) No alcohol use No caffeine use No drug use Tobacco use Former smoker.  Family History Antonietta Jewel, [place]; 12/21/2019 9:50 AM) Arthritis Mother. Diabetes Mellitus Brother, Family Members In General. Hypertension Brother, Family Members In [place], Mother, Sister. Malignant Neoplasm Of Pancreas Brother. Prostate Cancer Brother. Thyroid problems Sister.  Pregnancy / Birth History Antonietta Jewel, [place]; 12/21/2019 9:50 AM) Age at menarche 35 years.  Other Problems (Chanel Teressa Senter, [place]; 12/21/2019 9:50 AM) Back Pain Breast Cancer Cerebrovascular Accident Diabetes Mellitus Gastroesophageal Reflux Disease Heart murmur High blood pressure Lump In Breast Transfusion history     Review of Systems (Chanel Nolan CMA; 12/21/2019 9:50 AM) General Not Present- Appetite Loss, Chills, Fatigue, Fever, Night Sweats, Weight Gain and Weight Loss. Skin Present- Dryness. Not Present- Change in Wart/Mole, Hives, Jaundice, New Lesions, Non-Healing Wounds, Rash and Ulcer. HEENT Present- Hearing Loss and Nose Bleed. Not Present- Earache, Hoarseness, Oral Ulcers, Ringing in the Ears, Seasonal Allergies, Sinus Pain, Sore Throat, Visual Disturbances, Wears glasses/contact lenses and Yellow Eyes. Respiratory Not Present- Bloody sputum, Chronic Cough, Difficulty Breathing, Snoring and Wheezing. Breast Present- Breast Mass. Not Present-  Breast Pain, Nipple Discharge and Skin Changes. Cardiovascular Present- Leg Cramps and Swelling of Extremities. Not Present- Chest Pain, Difficulty Breathing Lying Down, Palpitations, Rapid Heart Rate and Shortness of Breath. Gastrointestinal Not Present- Abdominal Pain, Bloating, Bloody Stool, Change in Bowel Habits, Chronic diarrhea, Constipation, Difficulty Swallowing, Excessive gas, Gets full quickly at meals, Hemorrhoids, Indigestion, Nausea, Rectal  Pain and Vomiting. Female Genitourinary Not Present- Frequency, Nocturia, Painful Urination, Pelvic Pain and Urgency. Musculoskeletal Present- Back Pain and Joint Stiffness. Not Present- Joint Pain, Muscle Pain, Muscle Weakness and Swelling of Extremities. Neurological Present- Trouble walking. Not Present- Decreased Memory, Fainting, Headaches, Numbness, Seizures, Tingling, Tremor and Weakness. Psychiatric Present- Depression. Not Present- Anxiety, Bipolar, Change in Sleep Pattern, Fearful and Frequent crying. Endocrine Present- Hair Changes. Not Present- Cold Intolerance, Excessive Hunger, Heat Intolerance, Hot flashes and New Diabetes. Hematology Not Present- Blood Thinners, Easy Bruising, Excessive bleeding, Gland problems, HIV and Persistent Infections.  Vitals (Chanel Nolan CMA; 12/21/2019 9:50 AM) 12/21/2019 9:50 AM Weight: 181.38 lb Height: 64in Body Surface Area: 1.88 m Body Mass Index: 31.13 kg/m  Temp.: 97.105F  Pulse: 67 (Regular)         Physical Exam (Darvin Dials A. Rusell Meneely MD; 12/21/2019 3:55 PM)  General Mental Status-Alert. General Appearance-Consistent with stated age. Hydration-Well hydrated. Voice-Normal.  Head and Neck Head-normocephalic, atraumatic with no lesions or palpable masses. Trachea-midline. Thyroid Gland Characteristics - normal size and consistency.  Eye Eyeball - Bilateral-Extraocular movements intact. Sclera/Conjunctiva - Bilateral-No scleral icterus.  Chest and Lung  Exam Chest and lung exam reveals -quiet, even and easy respiratory effort with no use of accessory muscles and on auscultation, normal breath sounds, no adventitious sounds and normal vocal resonance. Inspection Chest Wall - Normal. Back - normal.  Breast Breast - Left-Symmetric, Non Tender, No Biopsy scars, no Dimpling - Left, No Inflammation, No Lumpectomy scars, No Mastectomy scars, No Peau d' Orange. Breast - Right-Symmetric, Non Tender, No Biopsy scars, no Dimpling - Right, No Inflammation, No Lumpectomy scars, No Mastectomy scars, No Peau d' Orange. Breast Lump-No Palpable Breast Mass.  Cardiovascular Cardiovascular examination reveals -normal heart sounds, regular rate and rhythm with no murmurs and normal pedal pulses bilaterally.  Abdomen Inspection Inspection of the abdomen reveals - No Hernias. Skin - Scar - no surgical scars. Palpation/Percussion Palpation and Percussion of the abdomen reveal - Soft, Non Tender, No Rebound tenderness, No Rigidity (guarding) and No hepatosplenomegaly. Auscultation Auscultation of the abdomen reveals - Bowel sounds normal.  Neurologic Neurologic evaluation reveals -alert and oriented x 3 with no impairment of recent or remote memory. Mental Status-Normal.  Musculoskeletal Normal Exam - Left-Upper Extremity Strength Normal and Lower Extremity Strength Normal. Normal Exam - Right-Upper Extremity Strength Normal and Lower Extremity Strength Normal.  Lymphatic Head & Neck  General Head & Neck Lymphatics: Bilateral - Description - Normal. Axillary  General Axillary Region: Bilateral - Description - Normal. Tenderness - Non Tender. Femoral & Inguinal  Generalized Femoral & Inguinal Lymphatics: Bilateral - Description - Normal. Tenderness - Non Tender.    Assessment & Plan (Muzammil Bruins A. Mashonda Broski MD; 12/21/2019 3:57 PM)  BREAST NEOPLASM, TIS (DCIS), LEFT (D05.12) Impression: Discuss lumpectomy versus COMET trial. She had a  lumpectomy 2 years ago which showed atypical ductal hyperplasia and was on trial of tamoxifen which she did not tolerate and has no interest in pursuing medical management of that. She has opted for lumpectomy with possible postoperative radiation therapy. Risk of lumpectomy include bleeding, infection, seroma, more surgery, use of seed/wire, wound care, cosmetic deformity and the need for other treatments, death , blood clots, death. Pt agrees to proceed. Total time 45 minutes  Current Plans Pt Education - [facility] Breast Cancer Information Given - [facility]ight "Breast Journey" Package You are being scheduled for surgery- Our schedulers will call you.  You should hear from our office's scheduling department within 5 working days about  the location, date, and time of surgery. We try to make accommodations for patient's preferences in scheduling surgery, but sometimes the OR schedule or the surgeon's schedule prevents [place] from making those accommodations.  If you have not heard from our office [phone]) in 5 working days, call the office and ask for your surgeon's nurse.  If you have other questions about your diagnosis, plan, or surgery, call the office and ask for your surgeon's nurse.  Pt Education - [facility] Breast Biopsy HCI: discussed with patient and provided information. We discussed the staging and pathophysiology of breast cancer. We discussed all of the different options for treatment for breast cancer including surgery, chemotherapy, radiation therapy, Herceptin, and antiestrogen therapy. We discussed a sentinel lymph node biopsy as she does not appear to having lymph node involvement right now. We discussed the performance of that with injection of radioactive tracer and blue dye. We discussed that she would have an incision underneath her axillary hairline. We discussed that there is a bout a 10-20% chance of having a positive node with a sentinel lymph node biopsy and we will await the permanent  pathology to make any other first further decisions in terms of her treatment. One of these options might be to return to the operating room to perform an axillary lymph node dissection. We discussed about a 1-2% risk lifetime of chronic shoulder pain as well as lymphedema associated with a sentinel lymph node biopsy. We discussed the options for treatment of the breast cancer which included lumpectomy versus a mastectomy. We discussed the performance of the lumpectomy with a wire placement. We discussed a 10-20% chance of a positive margin requiring reexcision in the operating room. We also discussed that she may need radiation therapy or antiestrogen therapy or both if she undergoes lumpectomy. We discussed the mastectomy and the postoperative care for that as well. We discussed that there is no difference in her survival whether she undergoes lumpectomy with radiation therapy or antiestrogen therapy versus a mastectomy. There is a slight difference in the local recurrence rate being 3-5% with lumpectomy and about 1% with a mastectomy. We discussed the risks of operation including bleeding, infection, possible reoperation. She understands her further therapy will be based on what her stages at the time of her operation.

## 2019-12-22 ENCOUNTER — Other Ambulatory Visit: Payer: Self-pay

## 2019-12-22 ENCOUNTER — Encounter: Payer: Self-pay | Admitting: Nurse Practitioner

## 2019-12-22 ENCOUNTER — Inpatient Hospital Stay: Payer: Medicare Other | Attending: Nurse Practitioner | Admitting: Nurse Practitioner

## 2019-12-22 VITALS — BP 123/52 | HR 79 | Temp 97.3°F | Resp 14 | Wt 178.2 lb

## 2019-12-22 DIAGNOSIS — D0511 Intraductal carcinoma in situ of right breast: Secondary | ICD-10-CM | POA: Diagnosis present

## 2019-12-22 DIAGNOSIS — Z170 Estrogen receptor positive status [ER+]: Secondary | ICD-10-CM | POA: Insufficient documentation

## 2019-12-22 DIAGNOSIS — D0512 Intraductal carcinoma in situ of left breast: Secondary | ICD-10-CM | POA: Diagnosis not present

## 2019-12-22 NOTE — Progress Notes (Addendum)
[place]   [phone] [phone]) [phone]   Clinic Follow up Note   Patient Care Team: Laurey Morale, MD as PCP - Cyndia Diver, MD as PCP - Cardiology (Cardiology) Reynold Bowen, MD as Consulting Physician (Endocrinology) Ashok Pall, MD as Consulting Physician (Neurosurgery) Earlie Server, MD as Consulting Physician (Orthopedic Surgery) Eli Hose, [facility] as Pharmacist (Pharmacist) 12/22/2019  CHIEF COMPLAINT: F/u history of ALH left breast and recent left breast biopsy   CURRENT THERAPY: Observation for [facility], did not tolerate anti-estrogen (anastrozole, tamoxifen) in 2016  INTERVAL HISTORY: [name] returns for f/u as scheduled. She was last seen for routine surveillance f/u on 04/29/19. She underwent routine annual mammogram on 08/20/19 that showed benign calcifications in both breasts, a biopsy clip in the right breast, and post-op findings and biopsy clip in the left breast, overall negative/benign. She had screening MRI on 11/27/19 that showed 5 mm round enhancing mass in the upper outer right breast and 2 bands of linear non mass enhancement measuring 3 cm in the right medial breast. Additionally within the lateral aspect of the left breast there is a 7x8 mm lobular enhancing mass. She underwent biopsy on 12/16/19 which showed intermediate grade DCIS ER/PR positive. She presents for results and further discussion.   In general, she feels well. Her chronic back pain is at baseline. Uses cane. Denies new pain. Denies change in appetite, weight, bowel habits, new bleeding, n/v/c/d, fever, chills, cough, chest pain, dyspnea, bleeding, or new issues.    MEDICAL HISTORY:  Past Medical History:  Diagnosis Date  . Abnormal nuclear stress test 12/29/2014  . Allergy   . Anemia, unspecified   . Arthritis   . Blood transfusion without reported diagnosis    2018  . CAD (coronary artery disease)    sees. Dr. Burt Knack. s/p cath with PCI of RCA  (xience des) June 2010. Pt also with LAD and D2 dzs-NL FFR in both areas med Rx  . Carotid artery occlusion   . Cataract    sees Dr. Herbert Deaner   . Depressive disorder, not elsewhere classified   . Duodenal nodule   . Edema   . Fibromyalgia   . Gastric polyps    COLON  . GERD (gastroesophageal reflux disease)   . Glaucoma    narrow angle, sees Dr. Herbert Deaner   . Headache(784.0)   . Hearing loss    left ear  . Heart murmur    questionable  . Hemorrhoids   . HSV (herpes simplex virus) anogenital infection 05/2014  . HTN (hypertension)   . Hyperlipidemia   . Low back pain    she sees Dr. Louanne Skye, also Dr. Ernestina Patches for pain medications and Benjiman Core PA for injections   . Obesity   . Osteopenia 01/2018   T score -1.6 FRAX 7.1% / 1.4%  . Peripheral vascular disease ([place])   . Stroke [facility])    TIA  "many yrs ago"    . TIA (transient ischemic attack)    also Hx of it.   . Type II or unspecified type diabetes mellitus without mention of complication, not stated as uncontrolled    sees Dr. Carrolyn Meiers     SURGICAL HISTORY: Past Surgical History:  Procedure Laterality Date  . ABDOMINAL HYSTERECTOMY  age 23   TAH and USO  Leiomyomata  . ANTERIOR CERVICAL CORPECTOMY N/A 06/11/2014   Procedure: ANTERIOR CERVICAL CORPECTOMY CERVICAL SIX; WITH CERVICAL FIVE TO CERVICAL SEVEN ARTHRODESIS;  Surgeon: Ashok Pall, MD;  Location: [place] NEURO ORS;  Service: Neurosurgery;  Laterality: N/A;  . BACK SURGERY  2008   lumb fusion  . BREAST BIOPSY Left 01/26/2014   Procedure: EXCISION LEFT BREAST MASS;  Surgeon: Adin Hector, MD;  Location: [place];  Service: General;  Laterality: Left;  . BREAST LUMPECTOMY WITH RADIOACTIVE SEED LOCALIZATION Left 07/24/2017   Procedure: LEFT BREAST LUMPECTOMY WITH RADIOACTIVE SEED LOCALIZATION ERAS;  Surgeon: Erroll Luna, MD;  Location: [facility];  Service: General;  Laterality: Left;  . CARDIAC CATHETERIZATION    . CARDIAC  CATHETERIZATION N/A 12/29/2014   Procedure: Left Heart Cath and Coronary Angiography;  Surgeon: Sherren Mocha, MD;  Location: [place] CV LAB;  Service: Cardiovascular;  Laterality: N/A;  . CAROTID ENDARTERECTOMY  march 2012   per Dr. Morton Amy  . CARPAL TUNNEL RELEASE Left 05/14/2014   Procedure: Left Carpal tunnel release;  Surgeon: Ashok Pall, MD;  Location: [place] NEURO ORS;  Service: Neurosurgery;  Laterality: Left;  Left Carpal tunnel release  . CARPAL TUNNEL RELEASE Bilateral   . CHOLECYSTECTOMY    . COLONOSCOPY  06/26/2017   per Dr. Henrene Pastor, adenomatous polyp, repeat in 5 yrs    . CORONARY STENT PLACEMENT     Dr. Burt Knack  . DILATION AND CURETTAGE OF UTERUS    . ESOPHAGOGASTRODUODENOSCOPY  02-08-06   per Dr. Henrene Pastor, gastritis   . EYE SURGERY Left June 2016   Cataract  . GLAUCOMA SURGERY     with laser, per Dr. Henrene Pastor   . HARDWARE REMOVAL  10/30/2016   Procedure: HARDWARE REMOVAL;  Surgeon: Jessy Oto, MD;  Location: [place];  Service: Orthopedics;;  . LUMBAR FUSION  2008  . POLYPECTOMY    . POSTERIOR LUMBAR FUSION  10/30/2016   Removal of hardware rods and pedicle screws lumbar four, Extension of fusion L4-5 to L5-S1 with reinsertion of screws at L 4, left L5-S1 transforaminal lumbar interbody fusion, Exploration  left L5 nerve root, bilateral decompressive laminectomy L3-4/notes 10/30/2016  . ULNAR NERVE TRANSPOSITION Left 05/14/2014   Procedure: Left Ulnar nerve decompression;  Surgeon: Ashok Pall, MD;  Location: [place] NEURO ORS;  Service: Neurosurgery;  Laterality: Left;  Left Ulnar nerve decompression    I have reviewed the social history and family history with the patient and they are unchanged from previous note.  ALLERGIES:  has No Known Allergies.  MEDICATIONS:  Current Outpatient Medications  Medication Sig Dispense Refill  . albuterol (PROVENTIL HFA;VENTOLIN HFA) 108 (90 Base) MCG/ACT inhaler Inhale 2 puffs into the lungs every 4 (four) hours as needed for wheezing  or shortness of breath. 1 Inhaler 2  . aspirin 81 MG tablet Take 81 mg by mouth daily.     . clobetasol cream (TEMOVATE) 0.05 % Apply to the outside of the vagina once daily 30 g 1  . diclofenac sodium (VOLTAREN) 1 % GEL Apply 2 g topically 4 (four) times daily as needed (for pain).     Marland Kitchen ergocalciferol (VITAMIN D2) 50000 UNITS capsule Take 50,000 Units by mouth every Saturday. On Saturday.    . furosemide (LASIX) 40 MG tablet Take 1 tablet (40 mg total) by mouth daily. 90 tablet 3  . gabapentin (NEURONTIN) 300 MG capsule Take 1 capsule (300 mg total) by mouth at bedtime. 30 capsule 1  . HUMALOG 100 UNIT/ML injection     . hydrALAZINE (APRESOLINE) 100 MG tablet TAKE 1 TABLET BY MOUTH THREE TIMES DAILY 270 tablet 2  . Insulin Disposable Pump (  V-GO 40) KIT   2  . Insulin Human (INSULIN PUMP) SOLN Inject into the skin. Uses Novolog insulin for pump    . irbesartan (AVAPRO) 300 MG tablet TAKE 1 TABLET BY MOUTH ONCE DAILY . APPOINTMENT REQUIRED FOR FUTURE REFILLS 90 tablet 3  . isosorbide mononitrate (IMDUR) 60 MG 24 hr tablet Take 1 tablet (60 mg total) by mouth daily. Please keep upcoming appt in July before anymore refills. Thank you 90 tablet 0  . latanoprost (XALATAN) 0.005 % ophthalmic solution Place 1 drop into both eyes at bedtime.     . metoprolol succinate (TOPROL-XL) 100 MG 24 hr tablet TAKE 1 TABLET BY MOUTH ONCE DAILY OR  IMMEDIATELY  FOLLOWING  A  MEAL 90 tablet 3  . montelukast (SINGULAIR) 10 MG tablet Take 10 mg by mouth daily.    . naproxen (NAPROSYN) 500 MG tablet Take 1 tablet (500 mg total) by mouth 2 (two) times daily with a meal. (Patient taking differently: Take 500 mg by mouth daily as needed for mild pain. ) 60 tablet 2  . nitroGLYCERIN (NITROSTAT) 0.4 MG SL tablet Place 1 tablet (0.4 mg total) under the tongue every 5 (five) minutes as needed for chest pain. 25 tablet 1  . nystatin-triamcinolone (MYCOLOG II) cream Apply 1 application topically 2 (two) times daily. 30 g 2  .  pantoprazole (PROTONIX) 40 MG tablet Take 40 mg by mouth daily.     . rosuvastatin (CRESTOR) 40 MG tablet Take 1 tablet by mouth once daily 90 tablet 3  . spironolactone (ALDACTONE) 50 MG tablet Take 1 tablet (50 mg total) by mouth daily. 90 tablet 3  . sulfamethoxazole-trimethoprim (BACTRIM DS) 800-160 MG tablet Take 1 tablet by mouth 2 (two) times daily. 20 tablet 0  . traMADol (ULTRAM) 50 MG tablet Take 2 tablets (100 mg total) by mouth every 6 (six) hours as needed. 30 tablet 0  . doxazosin (CARDURA) 4 MG tablet DAILY IN THE EVENING 90 tablet 1  . doxycycline (VIBRAMYCIN) 100 MG capsule Take 1 capsule (100 mg total) by mouth 2 (two) times daily. 20 capsule 0   No current facility-administered medications for this visit.    PHYSICAL EXAMINATION:  Vitals:   12/22/19 1214  BP: (!) 123/52  Pulse: 79  Resp: 14  Temp: (!) 97.3 F (36.3 C)  SpO2: 97%   Filed Weights   12/22/19 1214  Weight: 178 lb 3.2 oz (80.8 kg)    GENERAL:alert, no distress and comfortable SKIN: no rash  EYES: sclera clear NECK: without mass LYMPH:  no palpable cervical, supraclavicular, or axillary lymphadenopathy  LUNGS:  normal breathing effort HEART:  no lower extremity edema NEURO: alert & oriented x 3 with fluent speech, normal gait Breast: pendulous breasts that are symmetrical without nipple discharge or inversion. Right lateral breast with moderate ecchymoses overlying a 1.5x2 cm density in the right lateral. In the left upper outer quadrant there is a 3 cm area of soft tissue density beneath the bruise. No other palpable mass in either breast or axilla   LABORATORY DATA:  I have reviewed the data as listed No lab data for this visit.     RADIOGRAPHIC STUDIES: I have personally reviewed the radiological images as listed and agreed with the findings in the report. No results found.   ASSESSMENT & PLAN: [name] is a 74 y.o. female with   1. Left breast DCIS, intermediate grade, ER  95% strong + PR >95% strong + -Due to her  h/o ALH, breast density category C, and high risk for breast cancer she gets annual mammogram and screening breast MRI 6 months apart  -routine mammogram on 08/20/19 was negative. MRI on 11/27/19 showed 5 mm round enhancing mass in the upper outer right breast and 2 bands of linear non mass enhancement measuring 3 cm in the right medial breast. Additionally within the lateral aspect of the left breast there is a 7x8 mm lobular enhancing mass.  -Biopsy of left breast on 12/16/19 showed intermediate grade DCIS ER/PR positive and right breast fibrocystic changes -she is scheduled to undergo left lumpectomy with seed localization on 01/13/20 with Dr. Brantley Stage.  -We reviewed her DCIS recurrence risk after surgery using the MSK DCIS recurrence risk tool; if she does not proceed with adjuvant radiation or anti-estrogen, she has a 9% recurrence risk at 5 years and 15% at 10 years; this risk is reduced to 4% and 6%, respectively. If she proceeds with adjuvant radiation. If she proceeds with both adjuvant RT and anti-estrogen therapy, that risk is reduced to 2% at 5 years and 3% at 10 years. This information was given to her in writing.  -She is interested in adjuvant radiation, I have referred her to rad onc.  -Due to her previous poor tolerance to tamoxifen and anastrozole with severe joint pain in her knees, she is reluctant to try again but is willing to consider it.  -we will see her back after radiation to discuss anti-estrogen therapy. If she decides not to try, we will discuss surveillance in the future. Given her history, we will be recommending to continue annual mammogram and MRI q6 months apart. She understands. -F/u after radiation    2. Left breast ALH  -She is s/p left breast lumpectomy as of 01/26/14, which showed [place]; seen initially by Dr. Lona Kettle who discussed developing breast cancer and chemoprevention with tamoxifen versus anastrozole. The overall benefit of  chemoprevention is reduce her breast cancer by 50% with absolute benefit 3.5-4% risk reduction.  -She has tried anastrozole and tamoxifen but could not tolerate due to side effects, mostly joint pain in knees with limited mobility . -she decided not to tryRaloxifene -She has strong family history of breast cancer, genetic testing was negative. -Her prior left breast mass found on 06/2018 biopsy was removed by lumpectomy in 07/2017 and found to be benign Fibrocystic changes.  -her recent right breast biopsies showed fibrocystic changes, but a new DCIS in left breast. She is planning to undergo lumpectomy by Dr. Brantley Stage on 01/13/20 -Continue surveillance  3. Iron deficient anemia -she had mild iron deficiency anemia few years ago, took oral iron temporarily -She hadrepeated her colonoscopy in December 2018, which showed a 3 mm polyps in descending colon removed, diverticula,and internal hemorrhoids. -resolved. Last CBC on 02/20/19 showed normal Hgb  4. HTN, DM, CAD, history of TIA -She will continue follow-up with her primary care physician -Her 02/2019 A1c was 15.2, on insulin  5. Chronic Lower back pain  -s/p multiple back surgeries, chronic pain is stable.  -followed by Dr. Louanne Skye  PLAN: -Pathology and recurrence risk tool reviewed -Proceed with left lumpectomy and seed localization by Dr. Brantley Stage on 01/13/20 -Referral to rad onc to discuss adjuvant radiation   -Return after RT to discuss adjuvant AI vs surveillance only  -Continue mammogram/MRI 6 months apart regardless of AI  -The case was discussed with Dr. Burr Medico    Orders Placed This Encounter  Procedures  . Ambulatory referral to Radiation Oncology  Referral Priority:   Routine    Referral Type:   Consultation    Referral Reason:   Specialty Services Required    Requested Specialty:   Radiation Oncology    Number of Visits Requested:   1   All questions were answered. The patient knows to call the clinic with any  problems, questions or concerns. No barriers to learning were detected. Total encounter time was 30 minutes.      Alla Feeling, NP 12/22/19

## 2019-12-23 ENCOUNTER — Encounter: Payer: Self-pay | Admitting: *Deleted

## 2019-12-24 ENCOUNTER — Telehealth: Payer: Self-pay | Admitting: Nurse Practitioner

## 2019-12-24 NOTE — Telephone Encounter (Signed)
Scheduled appt per 6/15 los.  Spoke with pt and she is aware of the appt date and time.

## 2019-12-25 ENCOUNTER — Other Ambulatory Visit: Payer: Self-pay

## 2019-12-25 ENCOUNTER — Ambulatory Visit (INDEPENDENT_AMBULATORY_CARE_PROVIDER_SITE_OTHER): Payer: Medicare Other | Admitting: Specialist

## 2019-12-25 ENCOUNTER — Encounter: Payer: Self-pay | Admitting: Specialist

## 2019-12-25 VITALS — BP 116/64 | HR 71 | Ht 64.0 in | Wt 179.0 lb

## 2019-12-25 DIAGNOSIS — M7061 Trochanteric bursitis, right hip: Secondary | ICD-10-CM | POA: Diagnosis not present

## 2019-12-25 DIAGNOSIS — M4326 Fusion of spine, lumbar region: Secondary | ICD-10-CM

## 2019-12-25 DIAGNOSIS — M5126 Other intervertebral disc displacement, lumbar region: Secondary | ICD-10-CM | POA: Diagnosis not present

## 2019-12-25 NOTE — Progress Notes (Signed)
Office Visit Note   Patient: [name]           Date of Birth: 1946/04/28           MRN: [id number] Visit Date: 12/25/2019              Requested by: Laurey Morale, MD [place],  [place] [address] PCP: Laurey Morale, MD   Assessment & Plan: Visit Diagnoses:  1. HNP (herniated nucleus pulposus), lumbar   2. Lumbar disc herniation   3. Fusion of spine of lumbar region   4. Greater trochanteric bursitis, right     Plan: Avoid bending, stooping and avoid lifting weights greater than 10 lbs. Avoid prolong standing and walking. Avoid frequent bending and stooping  No lifting greater than 10 lbs. May use ice or moist heat for pain. Weight loss is of benefit. Handicap license is approved. Dr. Romona Curls secretary/Assistant will call to arrange for epidural steroid injection  Also you have bursitis in the right greater trochanter.  Due to the plan to undergo breast cancer treatment I recommend that you wait until the breast surgery is Completed and the pathology and testing is completed before having steroid injection of the right hip or the back. Local voltaren gel for the bursitis, heat or ice and exercises are recommended for the right hip.  Follow-Up Instructions: Return in about 6 weeks (around 02/05/2020).   Orders:  No orders of the defined types were placed in this encounter.  No orders of the defined types were placed in this encounter.     Procedures: No procedures performed   Clinical Data: No additional findings.   Subjective: Chief Complaint  Patient presents with  . Lower Back - Follow-up    CT review    74 year old female with bilateral foramenal stenosis L2-3, she is post injections 10/2019 with good relief, now with recurrent right buttock and right lateral thigh pain. Pain with direct pressure over the right lateral trochanter.    Review of Systems  Constitutional: Negative.   HENT: Negative.   Eyes: Negative.     Respiratory: Negative.   Cardiovascular: Negative.   Gastrointestinal: Negative.   Endocrine: Negative.   Genitourinary: Negative.   Musculoskeletal: Negative.   Skin: Negative.   Allergic/Immunologic: Negative.   Neurological: Negative.   Hematological: Negative.   Psychiatric/Behavioral: Negative.      Objective: Vital Signs: BP 116/64 (BP Location: Left Arm, Patient Position: Sitting)   Pulse 71   Ht 5\' 4"  (1.626 m)   Wt 179 lb (81.2 kg)   BMI 30.73 kg/m   Physical Exam Constitutional:      Appearance: She is well-developed.  HENT:     Head: Normocephalic and atraumatic.  Eyes:     Pupils: Pupils are equal, round, and reactive to light.  Pulmonary:     Effort: Pulmonary effort is normal.     Breath sounds: Normal breath sounds.  Abdominal:     General: Bowel sounds are normal.     Palpations: Abdomen is soft.  Musculoskeletal:     Cervical back: Normal range of motion and neck supple.     Lumbar back: Negative right straight leg raise test and negative left straight leg raise test.  Skin:    General: Skin is warm and dry.  Neurological:     Mental Status: She is alert and oriented to person, place, and time.  Psychiatric:        Behavior: Behavior  normal.        Thought Content: Thought content normal.        Judgment: Judgment normal.     Back Exam   Tenderness  The patient is experiencing tenderness in the lumbar.  Range of Motion  Extension: abnormal  Flexion: abnormal  Lateral bend right: abnormal  Lateral bend left: abnormal  Rotation right: abnormal  Rotation left: abnormal   Muscle Strength  Right Quadriceps:  5/5  Left Quadriceps:  5/5  Right Hamstrings:  5/5  Left Hamstrings:  5/5   Tests  Straight leg raise right: negative Straight leg raise left: negative  Reflexes  Achilles: 2/4  Other  Toe walk: normal Heel walk: normal Sensation: normal      Specialty Comments:  No specialty comments available.  Imaging: No  results found.   PMFS History: Patient Active Problem List   Diagnosis Date Noted  . Anemia due to blood loss 11/02/2016    Priority: High    Class: Acute  . Spondylolisthesis, lumbar region 10/30/2016    Priority: High    Class: Chronic  . Spinal stenosis of lumbar region 10/30/2016    Priority: High    Class: Chronic  . Retained orthopedic hardware 10/30/2016    Priority: High    Class: Chronic  . Right-sided thoracic back pain 07/01/2019  . Hyperlipidemia 07/17/2017  . History of lumbar spinal fusion   . History of fusion of lumbar spine   . Lethargy 10/31/2016  . AKI (acute kidney injury) ([place]) 10/31/2016  . Chronic diastolic CHF (congestive heart failure) ([place]) 10/31/2016  . Cough   . Leukocytosis   . Spinal stenosis of lumbar region with neurogenic claudication 10/30/2016  . Genetic testing 09/14/2015  . Atypical ductal hyperplasia of left breast 08/24/2015  . Family history of breast cancer   . Family history of prostate cancer   . Family history of stomach cancer   . Abnormal nuclear stress test 12/29/2014  . Cervical spondylosis with myelopathy and radiculopathy 06/11/2014  . Carpal tunnel syndrome 05/14/2014  . Aftercare following surgery of the circulatory system, [place] 03/11/2014  . Pain of right lower extremity 03/11/2014  . Atypical lobular hyperplasia of left breast 02/12/2014  . Fibromyalgia 09/21/2013  . Carotid artery disease ([place]) 02/29/2012  . Back abscess 01/22/2012  . Chest pain with moderate risk of acute coronary syndrome 12/09/2009  . Shortness of breath 06/10/2009  . Orthostatic hypotension 01/31/2009  . CAD (coronary artery disease) 12/23/2008  . Normocytic anemia 07/21/2008  . Headache(784.0) 07/21/2008  . History of TIA (transient ischemic attack) 07/21/2008  . Edema 02/27/2008  . Diabetes mellitus with complication ([place]) 12/75/1700  . Depression 07/16/2007  . Dyslipidemia 02/21/2007  . Essential hypertension 02/21/2007  . ASTHMA  02/21/2007  . GERD 02/21/2007  . LOW BACK PAIN 02/21/2007   Past Medical History:  Diagnosis Date  . Abnormal nuclear stress test 12/29/2014  . Allergy   . Anemia, unspecified   . Arthritis   . Blood transfusion without reported diagnosis    2018  . CAD (coronary artery disease)    sees. Dr. Burt Knack. s/p cath with PCI of RCA (xience des) June 2010. Pt also with LAD and D2 dzs-NL FFR in both areas med Rx  . Carotid artery occlusion   . Cataract    sees Dr. Herbert Deaner   . Depressive disorder, not elsewhere classified   . Duodenal nodule   . Edema   . Fibromyalgia   . Gastric polyps  COLON  . GERD (gastroesophageal reflux disease)   . Glaucoma    narrow angle, sees Dr. Herbert Deaner   . Headache(784.0)   . Hearing loss    left ear  . Heart murmur    questionable  . Hemorrhoids   . HSV (herpes simplex virus) anogenital infection 05/2014  . HTN (hypertension)   . Hyperlipidemia   . Low back pain    she sees Dr. Louanne Skye, also Dr. Ernestina Patches for pain medications and Benjiman Core PA for injections   . Obesity   . Osteopenia 01/2018   T score -1.6 FRAX 7.1% / 1.4%  . Peripheral vascular disease ([place])   . Stroke [facility])    TIA  "many yrs ago"    . TIA (transient ischemic attack)    also Hx of it.   . Type II or unspecified type diabetes mellitus without mention of complication, not stated as uncontrolled    sees Dr. Carrolyn Meiers     Family History  Problem Relation Age of Onset  . Hypertension Mother   . Hypertension Sister   . Hyperlipidemia Sister   . Hypertension Brother   . Cancer Brother        PROSTATE/LUNG  . Diabetes Brother   . Heart disease Brother        Before age 72 - Bypass  . Varicose Veins Brother   . Cancer Father        Prostate and pancreatic   . Stomach cancer Maternal Grandmother 32  . Heart disease Maternal Grandfather   . Breast cancer Maternal Aunt 80  . Cancer Maternal Aunt        Stomach  . Breast cancer Cousin 42       maternal first cousin  . Prostate  cancer Cousin 69  . Breast cancer Cousin 27  . Esophageal cancer Cousin   . Breast cancer Cousin 40       maternal first cousin's daughter  . Coronary artery disease Neg Hx        premature CAD  . Colon cancer Neg Hx   . Colon polyps Neg Hx   . Rectal cancer Neg Hx     Past Surgical History:  Procedure Laterality Date  . ABDOMINAL HYSTERECTOMY  age 48   TAH and USO  Leiomyomata  . ANTERIOR CERVICAL CORPECTOMY N/A 06/11/2014   Procedure: ANTERIOR CERVICAL CORPECTOMY CERVICAL SIX; WITH CERVICAL FIVE TO CERVICAL SEVEN ARTHRODESIS;  Surgeon: Ashok Pall, MD;  Location: [facility];  Service: Neurosurgery;  Laterality: N/A;  . BACK SURGERY  2008   lumb fusion  . BREAST BIOPSY Left 01/26/2014   Procedure: EXCISION LEFT BREAST MASS;  Surgeon: Adin Hector, MD;  Location: [facility];  Service: General;  Laterality: Left;  . BREAST LUMPECTOMY WITH RADIOACTIVE SEED LOCALIZATION Left 07/24/2017   Procedure: LEFT BREAST LUMPECTOMY WITH RADIOACTIVE SEED LOCALIZATION ERAS;  Surgeon: Erroll Luna, MD;  Location: [facility];  Service: General;  Laterality: Left;  . CARDIAC CATHETERIZATION    . CARDIAC CATHETERIZATION N/A 12/29/2014   Procedure: Left Heart Cath and Coronary Angiography;  Surgeon: Sherren Mocha, MD;  Location: [facility];  Service: Cardiovascular;  Laterality: N/A;  . CAROTID ENDARTERECTOMY  march 2012   per Dr. Morton Amy  . CARPAL TUNNEL RELEASE Left 05/14/2014   Procedure: Left Carpal tunnel release;  Surgeon: Ashok Pall, MD;  Location: [place];  Service: Neurosurgery;  Laterality: Left;  Left Carpal tunnel release  .  CARPAL TUNNEL RELEASE Bilateral   . CHOLECYSTECTOMY    . COLONOSCOPY  06/26/2017   per Dr. Henrene Pastor, adenomatous polyp, repeat in 5 yrs    . CORONARY STENT PLACEMENT     Dr. Burt Knack  . DILATION AND CURETTAGE OF UTERUS    . ESOPHAGOGASTRODUODENOSCOPY  02-08-06   per Dr. Henrene Pastor, gastritis   . EYE SURGERY Left June  2016   Cataract  . GLAUCOMA SURGERY     with laser, per Dr. Henrene Pastor   . HARDWARE REMOVAL  10/30/2016   Procedure: HARDWARE REMOVAL;  Surgeon: Jessy Oto, MD;  Location: [facility];  Service: Orthopedics;;  . LUMBAR FUSION  2008  . POLYPECTOMY    . POSTERIOR LUMBAR FUSION  10/30/2016   Removal of hardware rods and pedicle screws lumbar four, Extension of fusion L4-5 to L5-S1 with reinsertion of screws at L 4, left L5-S1 transforaminal lumbar interbody fusion, Exploration  left L5 nerve root, bilateral decompressive laminectomy L3-4/notes 10/30/2016  . ULNAR NERVE TRANSPOSITION Left 05/14/2014   Procedure: Left Ulnar nerve decompression;  Surgeon: Ashok Pall, MD;  Location: [facility];  Service: Neurosurgery;  Laterality: Left;  Left Ulnar nerve decompression   Social History   Occupational History  . Not on file  Tobacco Use  . Smoking status: Former Smoker    Quit date: 01/22/1967    Years since quitting: 52.9  . Smokeless tobacco: Never Used  Vaping Use  . Vaping Use: Never used  Substance and Sexual Activity  . Alcohol use: No    Alcohol/week: 0.0 standard drinks  . Drug use: No  . Sexual activity: Never    Birth control/protection: Surgical, Post-menopausal    Comment: HYST-1st intercourse 25 yo-5 partners

## 2019-12-25 NOTE — Patient Instructions (Signed)
Plan: Avoid bending, stooping and avoid lifting weights greater than 10 lbs. Avoid prolong standing and walking. Avoid frequent bending and stooping  No lifting greater than 10 lbs. May use ice or moist heat for pain. Weight loss is of benefit. Handicap license is approved. Dr. Romona Curls secretary/Assistant will call to arrange for epidural steroid injection  Also you have bursitis in the right greater trochanter.  Due to the plan to undergo breast cancer treatment I recommend that you wait until the breast surgery is Completed and the pathology and testing is completed before having steroid injection of the right hip or the back. Local voltaren gel for the bursitis, heat or ice and exercises are recommended for the right hip.

## 2019-12-28 ENCOUNTER — Encounter: Payer: Self-pay | Admitting: *Deleted

## 2019-12-31 ENCOUNTER — Encounter: Payer: Self-pay | Admitting: *Deleted

## 2020-01-04 NOTE — Progress Notes (Signed)
Radiation Oncology         [phone] ________________________________  Initial outpatient Consultation  Name: [name] MRN: [id number]  Date: 01/05/2020  DOB: Oct 22, 1945  CC:Fry, Ishmael Holter, MD  Alla Feeling, NP   REFERRING PHYSICIAN: Alla Feeling, NP  DIAGNOSIS:    ICD-10-CM   1. Ductal carcinoma in situ (DCIS) of left breast  D05.12    Stage 0 Left Breast DCIS, ER+ / PR+, Grade 2  CHIEF COMPLAINT: Here to discuss management of left breast DCIS  HISTORY OF PRESENT ILLNESS::[name] is a 74 y.o. femalewho has a history of left breast ALH in 01/2014. More recently, she presented with breast abnormality on the following imaging: breast MRI on the date of 11/27/2019.  This demonstrated indeterminant enhancing mass in the outer right breast, indeterminate linear non-mass-like enhancement in the medial right breast, and indeterminate enhancing mass in the outer left breast (7 x 8 mm).  Symptoms, if any, at that time, were: none.  Biopsy on date of 12/16/2019 showed: left breast DCIS; right breast showed fibrocystic changes.  ER status: 95%; PR status 95%; Grade 2.  She anticipates lumpectomy with Dr. Brantley Stage on July 7.   It should be noted that she has a history of taking tamoxifen and anastrozole in the past for prophylactic purposes.  She reports poor tolerance of these medications and recalls significant joint pain.  She has been undergoing mammography and MRI for high risk screening.  She has met with medical oncology.  She will reconsider adjuvant aromatase inhibitor after radiotherapy but she states that she has reservations about resuming antiestrogens.  She reports occasional shooting pains to both breasts that are transient.  She has chronic lower back pain.  She reports that she has received her Covid vaccines.  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 found on the Breast/Ovarian cancer panel   PREVIOUS RADIATION THERAPY: No  PAST MEDICAL  HISTORY:  has a past medical history of Abnormal nuclear stress test (12/29/2014), Allergy, Anemia, unspecified, Arthritis, Blood transfusion without reported diagnosis, CAD (coronary artery disease), Carotid artery occlusion, Cataract, Depressive disorder, not elsewhere classified, Duodenal nodule, Edema, Fibromyalgia, Gastric polyps, GERD (gastroesophageal reflux disease), Glaucoma, Headache(784.0), Hearing loss, Heart murmur, Hemorrhoids, HSV (herpes simplex virus) anogenital infection (05/2014), HTN (hypertension), Hyperlipidemia, Low back pain, Obesity, Osteopenia (01/2018), Peripheral vascular disease ([place]), Stroke ([place]), TIA (transient ischemic attack), and Type II or unspecified type diabetes mellitus without mention of complication, not stated as uncontrolled.    PAST SURGICAL HISTORY: Past Surgical History:  Procedure Laterality Date   ABDOMINAL HYSTERECTOMY  age 12   TAH and USO  Leiomyomata   ANTERIOR CERVICAL CORPECTOMY N/A 06/11/2014   Procedure: ANTERIOR CERVICAL CORPECTOMY CERVICAL SIX; WITH CERVICAL FIVE TO CERVICAL SEVEN ARTHRODESIS;  Surgeon: Ashok Pall, MD;  Location: [place] [facility];  Service: Neurosurgery;  Laterality: N/A;   BACK SURGERY  2008   lumb fusion   BREAST BIOPSY Left 01/26/2014   Procedure: EXCISION LEFT BREAST MASS;  Surgeon: Adin Hector, MD;  Location: [place];  Service: General;  Laterality: Left;   BREAST LUMPECTOMY WITH RADIOACTIVE SEED LOCALIZATION Left 07/24/2017   Procedure: LEFT BREAST LUMPECTOMY WITH RADIOACTIVE SEED LOCALIZATION ERAS;  Surgeon: Erroll Luna, MD;  Location: [place];  Service: General;  Laterality: Left;   CARDIAC CATHETERIZATION     CARDIAC CATHETERIZATION N/A 12/29/2014   Procedure: Left Heart Cath and Coronary Angiography;  Surgeon: Sherren Mocha, MD;  Location: [facility]  INVASIVE CV LAB;  Service: Cardiovascular;  Laterality: N/A;   CAROTID ENDARTERECTOMY  march 2012   per Dr. Morton Amy     CARPAL TUNNEL RELEASE Left 05/14/2014   Procedure: Left Carpal tunnel release;  Surgeon: Ashok Pall, MD;  Location: [place] [facility];  Service: Neurosurgery;  Laterality: Left;  Left Carpal tunnel release   CARPAL TUNNEL RELEASE Bilateral    CHOLECYSTECTOMY     COLONOSCOPY  06/26/2017   per Dr. Henrene Pastor, adenomatous polyp, repeat in 5 yrs     CORONARY STENT PLACEMENT     Dr. Burt Knack   DILATION AND CURETTAGE OF UTERUS     ESOPHAGOGASTRODUODENOSCOPY  02-08-06   per Dr. Henrene Pastor, gastritis    EYE SURGERY Left June 2016   Cataract   GLAUCOMA SURGERY     with laser, per Dr. Henrene Pastor    HARDWARE REMOVAL  10/30/2016   Procedure: HARDWARE REMOVAL;  Surgeon: Jessy Oto, MD;  Location: [place];  Service: Orthopedics;;   LUMBAR FUSION  2008   POLYPECTOMY     POSTERIOR LUMBAR FUSION  10/30/2016   Removal of hardware rods and pedicle screws lumbar four, Extension of fusion L4-5 to L5-S1 with reinsertion of screws at L 4, left L5-S1 transforaminal lumbar interbody fusion, Exploration  left L5 nerve root, bilateral decompressive laminectomy L3-4/notes 10/30/2016   ULNAR NERVE TRANSPOSITION Left 05/14/2014   Procedure: Left Ulnar nerve decompression;  Surgeon: Ashok Pall, MD;  Location: [place] [facility];  Service: Neurosurgery;  Laterality: Left;  Left Ulnar nerve decompression    FAMILY HISTORY: family history includes Breast cancer (age of onset: 16) in her cousin; Breast cancer (age of onset: 13) in her maternal aunt; Breast cancer (age of onset: 8) in her cousin; Breast cancer (age of onset: 85) in her cousin; Cancer in her brother, father, and maternal aunt; Diabetes in her brother; Esophageal cancer in her cousin; Heart disease in her brother and maternal grandfather; Hyperlipidemia in her sister; Hypertension in her brother, mother, and sister; Prostate cancer (age of onset: 34) in her cousin; Stomach cancer (age of onset: 20) in her maternal grandmother; Varicose Veins in her brother.  SOCIAL  HISTORY:  reports that she quit smoking about 52 years ago. She has never used smokeless tobacco. She reports that she does not drink alcohol and does not use drugs.  ALLERGIES: Patient has no known allergies.  MEDICATIONS:  Current Outpatient Medications  Medication Sig Dispense Refill   ACCU-CHEK GUIDE test strip 1 each by Other route in the morning and at bedtime.     albuterol (PROVENTIL HFA;VENTOLIN HFA) 108 (90 Base) MCG/ACT inhaler Inhale 2 puffs into the lungs every 4 (four) hours as needed for wheezing or shortness of breath. 1 Inhaler 2   aspirin 81 MG tablet Take 81 mg by mouth daily.      clobetasol cream (TEMOVATE) 0.05 % Apply to the outside of the vagina once daily 30 g 1   diclofenac sodium (VOLTAREN) 1 % GEL Apply 2 g topically 4 (four) times daily as needed (for pain).      ergocalciferol (VITAMIN D2) 50000 UNITS capsule Take 50,000 Units by mouth every Saturday. On Saturday.     FARXIGA 10 MG TABS tablet Take 10 mg by mouth every morning.     furosemide (LASIX) 40 MG tablet Take 1 tablet (40 mg total) by mouth daily. 90 tablet 3   gabapentin (NEURONTIN) 300 MG capsule Take 1 capsule (300 mg total) by mouth at bedtime. 30 capsule  1   HUMALOG 100 UNIT/ML injection      hydrALAZINE (APRESOLINE) 100 MG tablet TAKE 1 TABLET BY MOUTH THREE TIMES DAILY 270 tablet 2   Insulin Human (INSULIN PUMP) SOLN Inject into the skin. Uses Novolog insulin for pump     irbesartan (AVAPRO) 300 MG tablet TAKE 1 TABLET BY MOUTH ONCE DAILY . APPOINTMENT REQUIRED FOR FUTURE REFILLS 90 tablet 3   isosorbide mononitrate (IMDUR) 60 MG 24 hr tablet Take 1 tablet (60 mg total) by mouth daily. Please keep upcoming appt in July before anymore refills. Thank you 90 tablet 0   latanoprost (XALATAN) 0.005 % ophthalmic solution Place 1 drop into both eyes at bedtime.      metoprolol succinate (TOPROL-XL) 100 MG 24 hr tablet TAKE 1 TABLET BY MOUTH ONCE DAILY OR  IMMEDIATELY  FOLLOWING  A  MEAL 90  tablet 3   montelukast (SINGULAIR) 10 MG tablet Take 10 mg by mouth daily.     naproxen (NAPROSYN) 500 MG tablet Take 1 tablet (500 mg total) by mouth 2 (two) times daily with a meal. (Patient taking differently: Take 500 mg by mouth daily as needed for mild pain. ) 60 tablet 2   nystatin-triamcinolone (MYCOLOG II) cream Apply 1 application topically 2 (two) times daily. 30 g 2   OZEMPIC, 1 MG/DOSE, 4 MG/3ML SOPN Inject 1 mg into the skin once a week.     pantoprazole (PROTONIX) 40 MG tablet Take 40 mg by mouth daily.      rosuvastatin (CRESTOR) 40 MG tablet Take 1 tablet by mouth once daily 90 tablet 3   spironolactone (ALDACTONE) 50 MG tablet Take 1 tablet (50 mg total) by mouth daily. 90 tablet 3   traMADol (ULTRAM) 50 MG tablet Take 2 tablets (100 mg total) by mouth every 6 (six) hours as needed. 30 tablet 0   nitroGLYCERIN (NITROSTAT) 0.4 MG SL tablet Place 1 tablet (0.4 mg total) under the tongue every 5 (five) minutes as needed for chest pain. (Patient not taking: Reported on 01/05/2020) 25 tablet 1   No current facility-administered medications for this encounter.    REVIEW OF SYSTEMS: As above  PHYSICAL EXAM:  height is 5' 4"  (1.626 m). Her temperature is 97.8 F (36.6 C). Her blood pressure is 134/63 and her pulse is 72. Her respiration is 20 and oxygen saturation is 99%.   General: Alert and oriented, in no acute distress Breasts: She has bilateral palpable swelling at the biopsy sites in both breasts;  no palpable masses in the axillae bilaterally.  Neck: No palpable adenopathy in the cervical or supraclavicular regions MSK: She uses a cane to ambulate  ECOG = 1  0 - Asymptomatic (Fully active, able to carry on all predisease activities without restriction)  1 - Symptomatic but completely ambulatory (Restricted in physically strenuous activity but ambulatory and able to carry out work of a light or sedentary nature. For example, light housework, office work)  2 -  Symptomatic, <50% in bed during the day (Ambulatory and capable of all self care but unable to carry out any work activities. Up and about more than 50% of waking hours)  3 - Symptomatic, >50% in bed, but not bedbound (Capable of only limited self-care, confined to bed or chair 50% or more of waking hours)  4 - Bedbound (Completely disabled. Cannot carry on any self-care. Totally confined to bed or chair)  5 - Death   Eustace Pen MM, Creech RH, Tormey DC, et al. [phone]). "Toxicity  and response criteria of the [facility] Group". [place] Oncol. 5 (6): 649-55   LABORATORY DATA:  Lab Results  Component Value Date   WBC 8.2 02/20/2019   HGB 13.0 02/20/2019   HCT 39.7 02/20/2019   MCV 86.2 02/20/2019   PLT 154.0 02/20/2019   CMP     Component Value Date/Time   NA 142 05/05/2019 0938   NA 144 02/21/2016 0922   K 3.6 05/05/2019 0938   K 3.3 (L) 02/21/2016 0922   CL 103 05/05/2019 0938   CO2 26 05/05/2019 0938   CO2 28 02/21/2016 0922   GLUCOSE 155 (H) 05/05/2019 0938   GLUCOSE 335 (H) 02/20/2019 1022   GLUCOSE 112 02/21/2016 0922   BUN 13 05/05/2019 0938   BUN 12.0 02/21/2016 0922   CREATININE 0.89 05/05/2019 0938   CREATININE 0.9 02/21/2016 0922   CALCIUM 9.3 05/05/2019 0938   CALCIUM 9.7 02/21/2016 0922   PROT 6.2 02/20/2019 1022   PROT 6.8 02/21/2016 0922   ALBUMIN 4.1 02/20/2019 1022   ALBUMIN 3.7 02/21/2016 0922   AST 20 02/20/2019 1022   AST 18 02/21/2016 0922   ALT 27 02/20/2019 1022   ALT 19 02/21/2016 0922   ALKPHOS 91 02/20/2019 1022   ALKPHOS 72 02/21/2016 0922   BILITOT 0.7 02/20/2019 1022   BILITOT 0.56 02/21/2016 0922   GFRNONAA 65 05/05/2019 0938   GFRAA 75 05/05/2019 0938         RADIOGRAPHY: MM CLIP PLACEMENT LEFT  Result Date: 12/16/2019 CLINICAL DATA:  Bilateral post biopsy mammogram for clip placement. EXAM: DIAGNOSTIC BILATERAL MAMMOGRAM POST MRI BIOPSY COMPARISON:  Previous exam(s). FINDINGS: Mammographic images were obtained  following MRI guided biopsy of the bilateral breasts. The biopsy marking clips are in expected position at the site of biopsy. IMPRESSION: 1. Appropriate positioning of the dumbbell shaped biopsy marking clip at the site of biopsy in the upper inner right breast. 2. Appropriate positioning of the cylinder shaped biopsy marking clip at the site of biopsy in the upper-outer right breast. 3. Appropriate positioning of the dumbbell shaped biopsy marking clip at the site of biopsy in the upper-outer left breast at the patient's lumpectomy site. Final Assessment: Post Procedure Mammograms for Marker Placement Electronically Signed   By: Ammie Ferrier M.D.   On: 12/16/2019 10:13   MM CLIP PLACEMENT RIGHT  Result Date: 12/16/2019 CLINICAL DATA:  Bilateral post biopsy mammogram for clip placement. EXAM: DIAGNOSTIC BILATERAL MAMMOGRAM POST MRI BIOPSY COMPARISON:  Previous exam(s). FINDINGS: Mammographic images were obtained following MRI guided biopsy of the bilateral breasts. The biopsy marking clips are in expected position at the site of biopsy. IMPRESSION: 1. Appropriate positioning of the dumbbell shaped biopsy marking clip at the site of biopsy in the upper inner right breast. 2. Appropriate positioning of the cylinder shaped biopsy marking clip at the site of biopsy in the upper-outer right breast. 3. Appropriate positioning of the dumbbell shaped biopsy marking clip at the site of biopsy in the upper-outer left breast at the patient's lumpectomy site. Final Assessment: Post Procedure Mammograms for Marker Placement Electronically Signed   By: Ammie Ferrier M.D.   On: 12/16/2019 10:13   MR LT BREAST BX W LOC DEV 1ST LESION IMAGE BX SPEC MR GUIDE  Addendum Date: 12/21/2019   ADDENDUM REPORT: 12/21/2019 07:59 ADDENDUM: Pathology revealed FIBROCYSTIC CHANGES of the RIGHT breast, upper inner. This was found to be concordant by Dr. Ammie Ferrier. Pathology revealed FIBROCYSTIC CHANGES WITH FOCAL ADENOSIS  of the RIGHT breast, lateral. This was found to be concordant by Dr. Ammie Ferrier. Pathology revealed INTERMEDIATE GRADE DUCTAL CARCINOMA IN SITU of the LEFT breast, lateral. This was found to be concordant by Dr. Ammie Ferrier. Pathology results were discussed with the patient by telephone. The patient reported doing well after the biopsies with tenderness at the sites. Post biopsy instructions and care were reviewed and questions were answered. The patient was encouraged to call [facility] for any additional concerns. My direct phone number was provided for the patient. Surgical consultation has been arranged with Dr. Erroll Luna at [facility] on December 21, 2019. The patient is scheduled to see Dr. Truitt Merle at [facility] on December 22, 2019. The patient was instructed to return for a BILATERAL breast MRI in 6 months, per protocol. Pathology results reported by Terie Purser, RN on 12/21/2019. Electronically Signed   By: Ammie Ferrier M.D.   On: 12/21/2019 07:59   Result Date: 12/21/2019 CLINICAL DATA:  74 year old female presenting for MRI guided biopsy of the bilateral breasts. EXAM: MRI GUIDED CORE NEEDLE BIOPSY OF THE BILATERAL BREAST TECHNIQUE: Multiplanar, multisequence MR imaging of the bilateral breast was performed both before and after administration of intravenous contrast. CONTRAST:  51m GADAVIST GADOBUTROL 1 MMOL/ML IV SOLN COMPARISON:  Previous exams. FINDINGS: I met with the patient, and we discussed the procedure of MRI guided biopsy, including risks, benefits, and alternatives. Specifically, we discussed the risks of infection, bleeding, tissue injury, clip migration, and inadequate sampling. Informed, written consent was given. The usual time out protocol was performed immediately prior to the procedure. #1 Using sterile technique, 1% Lidocaine, MRI guidance, and a 9 gauge vacuum assisted device, biopsy was performed of linear non  mass enhancement in the upper inner right breast using a lateral approach. At the conclusion of the procedure, a dumbbell tissue marker clip was deployed into the biopsy cavity. #2 Using sterile technique, 1% Lidocaine, MRI guidance, and a 9 gauge vacuum assisted device, biopsy was performed of a mass in the lateral right breast using a lateral approach. At the conclusion of the procedure, a cylinder tissue marker clip was deployed into the biopsy cavity. #3 Using sterile technique, 1% Lidocaine, MRI guidance, and a 9 gauge vacuum assisted device, biopsy was performed of a mass in the lateral left breast using a lateral approach. At the conclusion of the procedure, a dumbbell tissue marker clip was deployed into the biopsy cavity. Follow-up 2-view mammogram was performed and dictated separately. IMPRESSION: 1. MRI guided biopsy of linear non mass enhancement in the upper inner right breast. No apparent complications. 2. MRI guided biopsy of an enhancing mass in the lateral right breast. No apparent complications. 3. MRI guided biopsy of an enhancing mass in the lateral left breast. No apparent complications. Electronically Signed: By: MAmmie FerrierM.D. On: 12/16/2019 09:29   MR RT BREAST BX W LOC DEV 1ST LESION IMAGE BX SPEC MR GUIDE  Addendum Date: 12/21/2019   ADDENDUM REPORT: 12/21/2019 07:59 ADDENDUM: Pathology revealed FIBROCYSTIC CHANGES of the RIGHT breast, upper inner. This was found to be concordant by Dr. MAmmie Ferrier Pathology revealed FIBROCYSTIC CHANGES WITH FOCAL ADENOSIS of the RIGHT breast, lateral. This was found to be concordant by Dr. MAmmie Ferrier Pathology revealed INTERMEDIATE GRADE DUCTAL CARCINOMA IN SITU of the LEFT breast, lateral. This was found to be concordant by Dr. MAmmie Ferrier Pathology results were discussed with the patient by telephone. The patient  reported doing well after the biopsies with tenderness at the sites. Post biopsy instructions and care were  reviewed and questions were answered. The patient was encouraged to call [facility] for any additional concerns. My direct phone number was provided for the patient. Surgical consultation has been arranged with Dr. Erroll Luna at [facility] on December 21, 2019. The patient is scheduled to see Dr. Truitt Merle at [facility] on December 22, 2019. The patient was instructed to return for a BILATERAL breast MRI in 6 months, per protocol. Pathology results reported by Terie Purser, RN on 12/21/2019. Electronically Signed   By: Ammie Ferrier M.D.   On: 12/21/2019 07:59   Result Date: 12/21/2019 CLINICAL DATA:  74 year old female presenting for MRI guided biopsy of the bilateral breasts. EXAM: MRI GUIDED CORE NEEDLE BIOPSY OF THE BILATERAL BREAST TECHNIQUE: Multiplanar, multisequence MR imaging of the bilateral breast was performed both before and after administration of intravenous contrast. CONTRAST:  13m GADAVIST GADOBUTROL 1 MMOL/ML IV SOLN COMPARISON:  Previous exams. FINDINGS: I met with the patient, and we discussed the procedure of MRI guided biopsy, including risks, benefits, and alternatives. Specifically, we discussed the risks of infection, bleeding, tissue injury, clip migration, and inadequate sampling. Informed, written consent was given. The usual time out protocol was performed immediately prior to the procedure. #1 Using sterile technique, 1% Lidocaine, MRI guidance, and a 9 gauge vacuum assisted device, biopsy was performed of linear non mass enhancement in the upper inner right breast using a lateral approach. At the conclusion of the procedure, a dumbbell tissue marker clip was deployed into the biopsy cavity. #2 Using sterile technique, 1% Lidocaine, MRI guidance, and a 9 gauge vacuum assisted device, biopsy was performed of a mass in the lateral right breast using a lateral approach. At the conclusion of the procedure, a cylinder tissue marker  clip was deployed into the biopsy cavity. #3 Using sterile technique, 1% Lidocaine, MRI guidance, and a 9 gauge vacuum assisted device, biopsy was performed of a mass in the lateral left breast using a lateral approach. At the conclusion of the procedure, a dumbbell tissue marker clip was deployed into the biopsy cavity. Follow-up 2-view mammogram was performed and dictated separately. IMPRESSION: 1. MRI guided biopsy of linear non mass enhancement in the upper inner right breast. No apparent complications. 2. MRI guided biopsy of an enhancing mass in the lateral right breast. No apparent complications. 3. MRI guided biopsy of an enhancing mass in the lateral left breast. No apparent complications. Electronically Signed: By: MAmmie FerrierM.D. On: 12/16/2019 09:29   MR RT BREAST BX W LOC DEV EA ADD LESION IMAGE BX SPEC MR GUIDE  Addendum Date: 12/21/2019   ADDENDUM REPORT: 12/21/2019 07:59 ADDENDUM: Pathology revealed FIBROCYSTIC CHANGES of the RIGHT breast, upper inner. This was found to be concordant by Dr. MAmmie Ferrier Pathology revealed FIBROCYSTIC CHANGES WITH FOCAL ADENOSIS of the RIGHT breast, lateral. This was found to be concordant by Dr. MAmmie Ferrier Pathology revealed INTERMEDIATE GRADE DUCTAL CARCINOMA IN SITU of the LEFT breast, lateral. This was found to be concordant by Dr. MAmmie Ferrier Pathology results were discussed with the patient by telephone. The patient reported doing well after the biopsies with tenderness at the sites. Post biopsy instructions and care were reviewed and questions were answered. [facility] My direct phone number was provided for the patient.  Surgical consultation has been arranged with Dr. Erroll Luna at [facility] on December 21, 2019. The patient is scheduled to see Dr. Truitt Merle at [facility] on December 22, 2019. The patient was  instructed to return for a BILATERAL breast MRI in 6 months, per protocol. Pathology results reported by Terie Purser, RN on 12/21/2019. Electronically Signed   By: Ammie Ferrier M.D.   On: 12/21/2019 07:59   Result Date: 12/21/2019 CLINICAL DATA:  74 year old female presenting for MRI guided biopsy of the bilateral breasts. EXAM: MRI GUIDED CORE NEEDLE BIOPSY OF THE BILATERAL BREAST TECHNIQUE: Multiplanar, multisequence MR imaging of the bilateral breast was performed both before and after administration of intravenous contrast. CONTRAST:  46m GADAVIST GADOBUTROL 1 MMOL/ML IV SOLN COMPARISON:  Previous exams. FINDINGS: I met with the patient, and we discussed the procedure of MRI guided biopsy, including risks, benefits, and alternatives. Specifically, we discussed the risks of infection, bleeding, tissue injury, clip migration, and inadequate sampling. Informed, written consent was given. The usual time out protocol was performed immediately prior to the procedure. #1 Using sterile technique, 1% Lidocaine, MRI guidance, and a 9 gauge vacuum assisted device, biopsy was performed of linear non mass enhancement in the upper inner right breast using a lateral approach. At the conclusion of the procedure, a dumbbell tissue marker clip was deployed into the biopsy cavity. #2 Using sterile technique, 1% Lidocaine, MRI guidance, and a 9 gauge vacuum assisted device, biopsy was performed of a mass in the lateral right breast using a lateral approach. At the conclusion of the procedure, a cylinder tissue marker clip was deployed into the biopsy cavity. #3 Using sterile technique, 1% Lidocaine, MRI guidance, and a 9 gauge vacuum assisted device, biopsy was performed of a mass in the lateral left breast using a lateral approach. At the conclusion of the procedure, a dumbbell tissue marker clip was deployed into the biopsy cavity. Follow-up 2-view mammogram was performed and dictated separately. IMPRESSION: 1. MRI guided  biopsy of linear non mass enhancement in the upper inner right breast. No apparent complications. 2. MRI guided biopsy of an enhancing mass in the lateral right breast. No apparent complications. 3. MRI guided biopsy of an enhancing mass in the lateral left breast. No apparent complications. Electronically Signed: By: MAmmie FerrierM.D. On: 12/16/2019 09:29      IMPRESSION/PLAN: This is a very pleasant 74year old woman with left Breast DCIS   She anticipates lumpectomy.  This is scheduled for July 7.  It was a pleasure meeting the patient today. We discussed the risks, benefits, and side effects of radiotherapy. I recommend radiotherapy to the left breast to reduce her risk of locoregional recurrence by 1/2.  We discussed that radiation would take approximately 3-4 weeks to complete and that I would give the patient a few weeks to heal following surgery before starting treatment planning. We spoke about acute effects including changes in breast size, skin irritation and fatigue as well as much less common late effects including internal organ injury or irritation. We spoke about the latest technology that is used to minimize the risk of late effects for patients undergoing radiotherapy to the breast or chest wall. No guarantees of treatment were given. The patient is enthusiastic about proceeding with treatment. I look forward to participating in the patient's care.  I will await her referral back to me for postoperative follow-up and eventual CT simulation/treatment planning.  We discussed measures to reduce the risk of infection  during the COVID-19 pandemic.  She reports that she has been vaccinated with 2 shots.  On date of service, in total, I spent 40 minutes on this encounter. Patient was seen in person.  __________________________________________   Eppie Gibson, MD   This document serves as a record of services personally performed by Eppie Gibson, MD. It was created on her behalf by  Wilburn Mylar, a trained medical scribe. The creation of this record is based on the scribe's personal observations and the provider's statements to them. This document has been checked and approved by the attending provider.

## 2020-01-04 NOTE — Progress Notes (Signed)
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 Breast, right, needle core biopsy, upper inner - FIBROCYSTIC CHANGES. - NO MALIGNANCY IDENTIFIED. 2. Breast, right, needle core biopsy, lateral - FIBROCYSTIC CHANGES WITH FOCAL ADENOSIS. - NO MALIGNANCY IDENTIFIED. 1 of 3 FINAL for [name], [name] [phone]) Diagnosis(continued) 3. Breast, left, needle core biopsy, lateral - DUCTAL CARCINOMA IN SITU, INTERMEDIATE GRADE. - SEE MICROSCOPIC DESCRIPTION  Receptor Status: ER(95%), PR (95%),   Did patient present with symptoms (if so, please note symptoms) or was this found on screening mammography?:  History of atypical hyperplasia to the left breast and underwent a lumpectomy on 01/26/2014. She tried tamoxifen and anastrozole but was not able to tolerate either due to side effects (joint pain/mobility). Another mass was found on 06/18/2017 and removed via lumpectomy on 07/24/2017. She gets annual mammograms and screening breast MRI 6 months apart. MRI on 11/27/19 showed 5 mm round enhancing mass in the upper outer right breast and 2 bands of linear non mass enhancement measuring 3 cm in the right medial breast. Additionally within the lateral aspect of the left breast there is a 7x8 mm lobular enhancing mass.   Past/Anticipated interventions by surgeon, if any: Scheduled for left breast lumpectomy by Dr. Erroll Luna on 01/13/2020  Past/Anticipated interventions by medical oncology, if any:  Under care of Dr. Truitt Merle 12/22/2019 -Pathology and recurrence risk tool reviewed -Proceed with left lumpectomy and seed localization by Dr. Brantley Stage on 01/13/20 -Referral to rad onc to discuss adjuvant radiation  -Return after RT to discuss adjuvant AI vs surveillance only  -Continue mammogram/MRI 6 months apart regardless of AI    Lymphedema issues, if any:  None    Pain issues, if any:  Occasional sharp/shooting pains to both breasts, resolves on own. Patient also deals with chronic lower back pain   SAFETY ISSUES:  Prior radiation? No  Pacemaker/ICD? No  Possible current pregnancy? No--abdominal hysterectomy at age 73  Is the patient on methotrexate? No  Current Complaints / other details:  Nothing of note

## 2020-01-05 ENCOUNTER — Encounter: Payer: Self-pay | Admitting: Radiation Oncology

## 2020-01-05 ENCOUNTER — Other Ambulatory Visit: Payer: Self-pay

## 2020-01-05 ENCOUNTER — Ambulatory Visit
Admission: RE | Admit: 2020-01-05 | Discharge: 2020-01-05 | Disposition: A | Discharge status: Home / Self Care | Payer: Medicare Other | Source: Ambulatory Visit | Attending: Radiation Oncology | Admitting: Radiation Oncology

## 2020-01-05 DIAGNOSIS — Z170 Estrogen receptor positive status [ER+]: Secondary | ICD-10-CM | POA: Insufficient documentation

## 2020-01-05 DIAGNOSIS — Z90722 Acquired absence of ovaries, bilateral: Secondary | ICD-10-CM | POA: Diagnosis not present

## 2020-01-05 DIAGNOSIS — D649 Anemia, unspecified: Secondary | ICD-10-CM | POA: Insufficient documentation

## 2020-01-05 DIAGNOSIS — F329 Major depressive disorder, single episode, unspecified: Secondary | ICD-10-CM | POA: Diagnosis not present

## 2020-01-05 DIAGNOSIS — M8580 Other specified disorders of bone density and structure, unspecified site: Secondary | ICD-10-CM | POA: Insufficient documentation

## 2020-01-05 DIAGNOSIS — D0512 Intraductal carcinoma in situ of left breast: Secondary | ICD-10-CM | POA: Insufficient documentation

## 2020-01-05 DIAGNOSIS — M545 Low back pain: Secondary | ICD-10-CM | POA: Diagnosis not present

## 2020-01-05 DIAGNOSIS — Z79899 Other long term (current) drug therapy: Secondary | ICD-10-CM | POA: Diagnosis not present

## 2020-01-05 DIAGNOSIS — Z90710 Acquired absence of both cervix and uterus: Secondary | ICD-10-CM | POA: Insufficient documentation

## 2020-01-05 DIAGNOSIS — Z7982 Long term (current) use of aspirin: Secondary | ICD-10-CM | POA: Diagnosis not present

## 2020-01-05 DIAGNOSIS — R011 Cardiac murmur, unspecified: Secondary | ICD-10-CM | POA: Insufficient documentation

## 2020-01-05 DIAGNOSIS — I10 Essential (primary) hypertension: Secondary | ICD-10-CM | POA: Insufficient documentation

## 2020-01-05 DIAGNOSIS — E785 Hyperlipidemia, unspecified: Secondary | ICD-10-CM | POA: Diagnosis not present

## 2020-01-05 DIAGNOSIS — E119 Type 2 diabetes mellitus without complications: Secondary | ICD-10-CM | POA: Diagnosis not present

## 2020-01-05 DIAGNOSIS — E669 Obesity, unspecified: Secondary | ICD-10-CM | POA: Diagnosis not present

## 2020-01-05 DIAGNOSIS — Z86010 Personal history of colonic polyps: Secondary | ICD-10-CM | POA: Insufficient documentation

## 2020-01-05 DIAGNOSIS — M797 Fibromyalgia: Secondary | ICD-10-CM | POA: Diagnosis not present

## 2020-01-05 DIAGNOSIS — Z87891 Personal history of nicotine dependence: Secondary | ICD-10-CM | POA: Diagnosis not present

## 2020-01-05 DIAGNOSIS — I2510 Atherosclerotic heart disease of native coronary artery without angina pectoris: Secondary | ICD-10-CM | POA: Diagnosis not present

## 2020-01-05 DIAGNOSIS — M129 Arthropathy, unspecified: Secondary | ICD-10-CM | POA: Insufficient documentation

## 2020-01-05 DIAGNOSIS — K219 Gastro-esophageal reflux disease without esophagitis: Secondary | ICD-10-CM | POA: Diagnosis not present

## 2020-01-05 DIAGNOSIS — I739 Peripheral vascular disease, unspecified: Secondary | ICD-10-CM | POA: Diagnosis not present

## 2020-01-05 DIAGNOSIS — Z803 Family history of malignant neoplasm of breast: Secondary | ICD-10-CM | POA: Insufficient documentation

## 2020-01-05 NOTE — Progress Notes (Signed)
Chart reviewed with Dr Rodman Comp. Patient has hx CAD with stent placement to RCA 2010. She saw Dr Oval Linsey 05-07-19 and has had no chest pain or SOB. Per Dr Ola Spurr she does not need further cards clearance. The patient also has uncontrolled diabetes and is on an insulin pump. She will need to get her sugars under better control or her surgery may be cancelled.

## 2020-01-06 ENCOUNTER — Encounter: Payer: Self-pay | Admitting: Radiation Oncology

## 2020-01-06 DIAGNOSIS — D0512 Intraductal carcinoma in situ of left breast: Secondary | ICD-10-CM | POA: Insufficient documentation

## 2020-01-07 ENCOUNTER — Other Ambulatory Visit: Payer: Self-pay

## 2020-01-07 ENCOUNTER — Encounter (HOSPITAL_BASED_OUTPATIENT_CLINIC_OR_DEPARTMENT_OTHER): Payer: Self-pay | Admitting: Surgery

## 2020-01-07 MED ORDER — TRAMADOL HCL 50 MG PO TABS: 100.0000 mg | ORAL_TABLET | Freq: Four times a day (QID) | ORAL | 0 refills | Status: DC | PRN

## 2020-01-07 NOTE — Telephone Encounter (Signed)
Patient called in wanting to get voltaren prescribed or anything else for hip pain.

## 2020-01-09 ENCOUNTER — Other Ambulatory Visit (HOSPITAL_COMMUNITY)
Admission: RE | Admit: 2020-01-09 | Discharge: 2020-01-09 | Disposition: A | Discharge status: Home / Self Care | Payer: Medicare Other | Source: Ambulatory Visit | Attending: Surgery | Admitting: Surgery

## 2020-01-09 ENCOUNTER — Other Ambulatory Visit (HOSPITAL_COMMUNITY): Payer: Medicare Other

## 2020-01-09 DIAGNOSIS — Z20822 Contact with and (suspected) exposure to covid-19: Secondary | ICD-10-CM | POA: Insufficient documentation

## 2020-01-09 DIAGNOSIS — Z01812 Encounter for preprocedural laboratory examination: Secondary | ICD-10-CM | POA: Diagnosis present

## 2020-01-09 LAB — SARS CORONAVIRUS 2 (TAT 6-24 HRS): SARS Coronavirus 2: NEGATIVE

## 2020-01-12 ENCOUNTER — Other Ambulatory Visit: Payer: Self-pay

## 2020-01-12 ENCOUNTER — Encounter (HOSPITAL_BASED_OUTPATIENT_CLINIC_OR_DEPARTMENT_OTHER)
Admission: RE | Admit: 2020-01-12 | Discharge: 2020-01-12 | Disposition: A | Discharge status: Home / Self Care | Payer: Medicare Other | Source: Ambulatory Visit | Attending: Surgery | Admitting: Surgery

## 2020-01-12 ENCOUNTER — Other Ambulatory Visit (HOSPITAL_COMMUNITY): Payer: Medicare Other

## 2020-01-12 DIAGNOSIS — K219 Gastro-esophageal reflux disease without esophagitis: Secondary | ICD-10-CM | POA: Diagnosis not present

## 2020-01-12 DIAGNOSIS — E785 Hyperlipidemia, unspecified: Secondary | ICD-10-CM | POA: Diagnosis not present

## 2020-01-12 DIAGNOSIS — Z794 Long term (current) use of insulin: Secondary | ICD-10-CM | POA: Diagnosis not present

## 2020-01-12 DIAGNOSIS — Z8673 Personal history of transient ischemic attack (TIA), and cerebral infarction without residual deficits: Secondary | ICD-10-CM | POA: Diagnosis not present

## 2020-01-12 DIAGNOSIS — I110 Hypertensive heart disease with heart failure: Secondary | ICD-10-CM | POA: Diagnosis not present

## 2020-01-12 DIAGNOSIS — I739 Peripheral vascular disease, unspecified: Secondary | ICD-10-CM | POA: Diagnosis not present

## 2020-01-12 DIAGNOSIS — Z6830 Body mass index (BMI) 30.0-30.9, adult: Secondary | ICD-10-CM | POA: Diagnosis not present

## 2020-01-12 DIAGNOSIS — Z79899 Other long term (current) drug therapy: Secondary | ICD-10-CM | POA: Diagnosis not present

## 2020-01-12 DIAGNOSIS — D0512 Intraductal carcinoma in situ of left breast: Secondary | ICD-10-CM | POA: Diagnosis not present

## 2020-01-12 DIAGNOSIS — I2510 Atherosclerotic heart disease of native coronary artery without angina pectoris: Secondary | ICD-10-CM | POA: Diagnosis not present

## 2020-01-12 DIAGNOSIS — M1990 Unspecified osteoarthritis, unspecified site: Secondary | ICD-10-CM | POA: Diagnosis not present

## 2020-01-12 DIAGNOSIS — M797 Fibromyalgia: Secondary | ICD-10-CM | POA: Diagnosis not present

## 2020-01-12 DIAGNOSIS — D649 Anemia, unspecified: Secondary | ICD-10-CM | POA: Diagnosis not present

## 2020-01-12 DIAGNOSIS — E669 Obesity, unspecified: Secondary | ICD-10-CM | POA: Diagnosis not present

## 2020-01-12 DIAGNOSIS — E119 Type 2 diabetes mellitus without complications: Secondary | ICD-10-CM | POA: Diagnosis not present

## 2020-01-12 DIAGNOSIS — Z87891 Personal history of nicotine dependence: Secondary | ICD-10-CM | POA: Diagnosis not present

## 2020-01-12 LAB — CBC WITH DIFFERENTIAL/PLATELET
Abs Immature Granulocytes: 0.03 10*3/uL (ref 0.00–0.07)
Basophils Absolute: 0 10*3/uL (ref 0.0–0.1)
Basophils Relative: 0 %
Eosinophils Absolute: 0.1 10*3/uL (ref 0.0–0.5)
Eosinophils Relative: 1 %
HCT: 40.2 % (ref 36.0–46.0)
Hemoglobin: 12.4 g/dL (ref 12.0–15.0)
Immature Granulocytes: 0 %
Lymphocytes Relative: 22 %
Lymphs Abs: 2.3 10*3/uL (ref 0.7–4.0)
MCH: 27.5 pg (ref 26.0–34.0)
MCHC: 30.8 g/dL (ref 30.0–36.0)
MCV: 89.1 fL (ref 80.0–100.0)
Monocytes Absolute: 0.7 10*3/uL (ref 0.1–1.0)
Monocytes Relative: 7 %
Neutro Abs: 7.2 10*3/uL (ref 1.7–7.7)
Neutrophils Relative %: 70 %
Platelets: 182 10*3/uL (ref 150–400)
RBC: 4.51 MIL/uL (ref 3.87–5.11)
RDW: 14.9 % (ref 11.5–15.5)
WBC: 10.3 10*3/uL (ref 4.0–10.5)
nRBC: 0 % (ref 0.0–0.2)

## 2020-01-12 LAB — COMPREHENSIVE METABOLIC PANEL
ALT: 19 U/L (ref 0–44)
AST: 22 U/L (ref 15–41)
Albumin: 4 g/dL (ref 3.5–5.0)
Alkaline Phosphatase: 64 U/L (ref 38–126)
Anion gap: 10 (ref 5–15)
BUN: 16 mg/dL (ref 8–23)
CO2: 27 mmol/L (ref 22–32)
Calcium: 9.5 mg/dL (ref 8.9–10.3)
Chloride: 106 mmol/L (ref 98–111)
Creatinine, Ser: 1.21 mg/dL — ABNORMAL HIGH (ref 0.44–1.00)
GFR calc Af Amer: 52 mL/min — ABNORMAL LOW (ref >60)
GFR calc non Af Amer: 45 mL/min — ABNORMAL LOW (ref >60)
Glucose, Bld: 121 mg/dL — ABNORMAL HIGH (ref 70–99)
Potassium: 4.1 mmol/L (ref 3.5–5.1)
Sodium: 143 mmol/L (ref 135–145)
Total Bilirubin: 0.6 mg/dL (ref 0.3–1.2)
Total Protein: 7.1 g/dL (ref 6.5–8.1)

## 2020-01-13 ENCOUNTER — Ambulatory Visit (HOSPITAL_BASED_OUTPATIENT_CLINIC_OR_DEPARTMENT_OTHER): Payer: Medicare Other | Admitting: Anesthesiology

## 2020-01-13 ENCOUNTER — Encounter (HOSPITAL_BASED_OUTPATIENT_CLINIC_OR_DEPARTMENT_OTHER)
Admission: RE | Disposition: A | Discharge status: Home / Self Care | Payer: Self-pay | Source: Home / Self Care | Attending: Surgery

## 2020-01-13 ENCOUNTER — Other Ambulatory Visit: Payer: Self-pay

## 2020-01-13 ENCOUNTER — Encounter (HOSPITAL_BASED_OUTPATIENT_CLINIC_OR_DEPARTMENT_OTHER): Payer: Self-pay | Admitting: Surgery

## 2020-01-13 ENCOUNTER — Ambulatory Visit (HOSPITAL_BASED_OUTPATIENT_CLINIC_OR_DEPARTMENT_OTHER)
Admission: RE | Admit: 2020-01-13 | Discharge: 2020-01-13 | Disposition: A | Discharge status: Home / Self Care | Payer: Medicare Other | Attending: Surgery | Admitting: Surgery

## 2020-01-13 DIAGNOSIS — E119 Type 2 diabetes mellitus without complications: Secondary | ICD-10-CM | POA: Diagnosis not present

## 2020-01-13 DIAGNOSIS — D0512 Intraductal carcinoma in situ of left breast: Secondary | ICD-10-CM | POA: Insufficient documentation

## 2020-01-13 DIAGNOSIS — I110 Hypertensive heart disease with heart failure: Secondary | ICD-10-CM | POA: Diagnosis not present

## 2020-01-13 DIAGNOSIS — I2510 Atherosclerotic heart disease of native coronary artery without angina pectoris: Secondary | ICD-10-CM | POA: Diagnosis not present

## 2020-01-13 DIAGNOSIS — M1990 Unspecified osteoarthritis, unspecified site: Secondary | ICD-10-CM | POA: Insufficient documentation

## 2020-01-13 DIAGNOSIS — I739 Peripheral vascular disease, unspecified: Secondary | ICD-10-CM | POA: Insufficient documentation

## 2020-01-13 DIAGNOSIS — Z87891 Personal history of nicotine dependence: Secondary | ICD-10-CM | POA: Diagnosis not present

## 2020-01-13 DIAGNOSIS — Z7984 Long term (current) use of oral hypoglycemic drugs: Secondary | ICD-10-CM | POA: Diagnosis not present

## 2020-01-13 DIAGNOSIS — D649 Anemia, unspecified: Secondary | ICD-10-CM | POA: Insufficient documentation

## 2020-01-13 DIAGNOSIS — Z79899 Other long term (current) drug therapy: Secondary | ICD-10-CM | POA: Insufficient documentation

## 2020-01-13 DIAGNOSIS — K219 Gastro-esophageal reflux disease without esophagitis: Secondary | ICD-10-CM | POA: Diagnosis not present

## 2020-01-13 DIAGNOSIS — E785 Hyperlipidemia, unspecified: Secondary | ICD-10-CM | POA: Insufficient documentation

## 2020-01-13 DIAGNOSIS — Z794 Long term (current) use of insulin: Secondary | ICD-10-CM | POA: Insufficient documentation

## 2020-01-13 DIAGNOSIS — Z6830 Body mass index (BMI) 30.0-30.9, adult: Secondary | ICD-10-CM | POA: Insufficient documentation

## 2020-01-13 DIAGNOSIS — E669 Obesity, unspecified: Secondary | ICD-10-CM | POA: Insufficient documentation

## 2020-01-13 DIAGNOSIS — I5032 Chronic diastolic (congestive) heart failure: Secondary | ICD-10-CM | POA: Diagnosis not present

## 2020-01-13 DIAGNOSIS — M797 Fibromyalgia: Secondary | ICD-10-CM | POA: Insufficient documentation

## 2020-01-13 DIAGNOSIS — Z8673 Personal history of transient ischemic attack (TIA), and cerebral infarction without residual deficits: Secondary | ICD-10-CM | POA: Diagnosis not present

## 2020-01-13 HISTORY — PX: BREAST LUMPECTOMY WITH RADIOACTIVE SEED LOCALIZATION: SHX6424

## 2020-01-13 LAB — GLUCOSE, CAPILLARY
Glucose-Capillary: 70 mg/dL (ref 70–99)
Glucose-Capillary: 78 mg/dL (ref 70–99)

## 2020-01-13 SURGERY — BREAST LUMPECTOMY WITH RADIOACTIVE SEED LOCALIZATION
Anesthesia: General | Site: Breast | Laterality: Left

## 2020-01-13 MED ORDER — OXYCODONE HCL 5 MG PO TABS
5.0000 mg | ORAL_TABLET | Freq: Once | ORAL | Status: AC | PRN
  Administered 2020-01-13: 5 mg via ORAL

## 2020-01-13 MED ORDER — EPHEDRINE SULFATE 50 MG/ML IJ SOLN
INTRAMUSCULAR | Status: DC | PRN
  Administered 2020-01-13: 10 mg via INTRAVENOUS

## 2020-01-13 MED ORDER — PROPOFOL 10 MG/ML IV BOLUS
INTRAVENOUS | Status: AC
  Filled 2020-01-13: qty 20

## 2020-01-13 MED ORDER — CHLORHEXIDINE GLUCONATE CLOTH 2 % EX PADS: 6.0000 | MEDICATED_PAD | Freq: Once | CUTANEOUS | Status: DC

## 2020-01-13 MED ORDER — OXYCODONE HCL 5 MG PO TABS
ORAL_TABLET | ORAL | Status: AC
  Filled 2020-01-13: qty 1

## 2020-01-13 MED ORDER — LACTATED RINGERS IV SOLN: INTRAVENOUS | Status: DC

## 2020-01-13 MED ORDER — CELECOXIB 200 MG PO CAPS
200.0000 mg | ORAL_CAPSULE | ORAL | Status: AC
  Administered 2020-01-13: 200 mg via ORAL

## 2020-01-13 MED ORDER — OXYCODONE HCL 5 MG/5ML PO SOLN: 5.0000 mg | Freq: Once | ORAL | Status: AC | PRN

## 2020-01-13 MED ORDER — HYDROCODONE-ACETAMINOPHEN 5-325 MG PO TABS
1.0000 | ORAL_TABLET | Freq: Four times a day (QID) | ORAL | 0 refills | Status: DC | PRN
Start: 2020-01-13 — End: 2020-03-23

## 2020-01-13 MED ORDER — ONDANSETRON HCL 4 MG/2ML IJ SOLN
INTRAMUSCULAR | Status: AC
  Filled 2020-01-13: qty 2

## 2020-01-13 MED ORDER — DEXAMETHASONE SODIUM PHOSPHATE 4 MG/ML IJ SOLN
INTRAMUSCULAR | Status: DC | PRN
  Administered 2020-01-13: 5 mg via INTRAVENOUS

## 2020-01-13 MED ORDER — CELECOXIB 200 MG PO CAPS
ORAL_CAPSULE | ORAL | Status: AC
  Filled 2020-01-13: qty 1

## 2020-01-13 MED ORDER — FENTANYL CITRATE (PF) 100 MCG/2ML IJ SOLN
INTRAMUSCULAR | Status: DC | PRN
  Administered 2020-01-13: 50 ug via INTRAVENOUS

## 2020-01-13 MED ORDER — LIDOCAINE 2% (20 MG/ML) 5 ML SYRINGE
INTRAMUSCULAR | Status: AC
  Filled 2020-01-13: qty 5

## 2020-01-13 MED ORDER — DEXAMETHASONE SODIUM PHOSPHATE 10 MG/ML IJ SOLN
INTRAMUSCULAR | Status: AC
  Filled 2020-01-13: qty 1

## 2020-01-13 MED ORDER — PROPOFOL 10 MG/ML IV BOLUS
INTRAVENOUS | Status: DC | PRN
  Administered 2020-01-13: 120 mg via INTRAVENOUS

## 2020-01-13 MED ORDER — LABETALOL HCL 5 MG/ML IV SOLN
INTRAVENOUS | Status: AC
  Filled 2020-01-13: qty 4

## 2020-01-13 MED ORDER — DEXTROSE 5 % IV SOLN: 3.0000 g | INTRAVENOUS | Status: DC

## 2020-01-13 MED ORDER — CEFAZOLIN SODIUM-DEXTROSE 2-4 GM/100ML-% IV SOLN
INTRAVENOUS | Status: AC
  Filled 2020-01-13: qty 100

## 2020-01-13 MED ORDER — ACETAMINOPHEN 500 MG PO TABS
1000.0000 mg | ORAL_TABLET | ORAL | Status: AC
  Administered 2020-01-13: 1000 mg via ORAL

## 2020-01-13 MED ORDER — ONDANSETRON HCL 4 MG/2ML IJ SOLN
INTRAMUSCULAR | Status: DC | PRN
  Administered 2020-01-13: 4 mg via INTRAVENOUS

## 2020-01-13 MED ORDER — CEFAZOLIN SODIUM-DEXTROSE 2-4 GM/100ML-% IV SOLN
2.0000 g | INTRAVENOUS | Status: AC
  Administered 2020-01-13: 2 g via INTRAVENOUS

## 2020-01-13 MED ORDER — LIDOCAINE HCL (CARDIAC) PF 100 MG/5ML IV SOSY
PREFILLED_SYRINGE | INTRAVENOUS | Status: DC | PRN
  Administered 2020-01-13: 40 mg via INTRAVENOUS

## 2020-01-13 MED ORDER — ONDANSETRON HCL 4 MG/2ML IJ SOLN: 4.0000 mg | Freq: Once | INTRAMUSCULAR | Status: DC | PRN

## 2020-01-13 MED ORDER — LABETALOL HCL 5 MG/ML IV SOLN
10.0000 mg | Freq: Once | INTRAVENOUS | Status: AC
  Administered 2020-01-13: 5 mg via INTRAVENOUS

## 2020-01-13 MED ORDER — FENTANYL CITRATE (PF) 100 MCG/2ML IJ SOLN
INTRAMUSCULAR | Status: AC
  Filled 2020-01-13: qty 2

## 2020-01-13 MED ORDER — FENTANYL CITRATE (PF) 100 MCG/2ML IJ SOLN: 25.0000 ug | INTRAMUSCULAR | Status: DC | PRN

## 2020-01-13 MED ORDER — BUPIVACAINE HCL (PF) 0.25 % IJ SOLN
INTRAMUSCULAR | Status: DC | PRN
  Administered 2020-01-13: 20 mL

## 2020-01-13 MED ORDER — GABAPENTIN 300 MG PO CAPS: 300.0000 mg | ORAL_CAPSULE | ORAL | Status: DC

## 2020-01-13 MED ORDER — ACETAMINOPHEN 500 MG PO TABS
ORAL_TABLET | ORAL | Status: AC
  Filled 2020-01-13: qty 2

## 2020-01-13 SURGICAL SUPPLY — 36 items
APPLIER CLIP 9.375 MED OPEN (MISCELLANEOUS) ×3
BINDER BREAST XLRG (GAUZE/BANDAGES/DRESSINGS) ×3 IMPLANT
BINDER BREAST XXLRG (GAUZE/BANDAGES/DRESSINGS) IMPLANT
BLADE SURG 15 STRL LF DISP TIS (BLADE) ×1 IMPLANT
BLADE SURG 15 STRL SS (BLADE) ×3
CHLORAPREP W/TINT 26 (MISCELLANEOUS) ×3 IMPLANT
CLIP APPLIE 9.375 MED OPEN (MISCELLANEOUS) ×1 IMPLANT
COVER BACK TABLE 60X90IN (DRAPES) ×3 IMPLANT
COVER MAYO STAND STRL (DRAPES) ×3 IMPLANT
COVER PROBE W GEL 5X96 (DRAPES) ×3 IMPLANT
DERMABOND ADVANCED (GAUZE/BANDAGES/DRESSINGS) ×2
DERMABOND ADVANCED .7 DNX12 (GAUZE/BANDAGES/DRESSINGS) ×1 IMPLANT
DRAPE LAPAROTOMY 100X72 PEDS (DRAPES) ×3 IMPLANT
DRAPE UTILITY XL STRL (DRAPES) ×3 IMPLANT
ELECT COATED BLADE 2.86 ST (ELECTRODE) ×3 IMPLANT
ELECT REM PT RETURN 9FT ADLT (ELECTROSURGICAL) ×3
ELECTRODE REM PT RTRN 9FT ADLT (ELECTROSURGICAL) ×1 IMPLANT
GLOVE BIO SURGEON STRL SZ 6.5 (GLOVE) ×2 IMPLANT
GLOVE BIO SURGEONS STRL SZ 6.5 (GLOVE) ×1
GLOVE BIOGEL PI IND STRL 8 (GLOVE) ×1 IMPLANT
GLOVE BIOGEL PI INDICATOR 8 (GLOVE) ×2
GLOVE ECLIPSE 8.0 STRL XLNG CF (GLOVE) ×3 IMPLANT
GOWN STRL REUS W/ TWL LRG LVL3 (GOWN DISPOSABLE) ×2 IMPLANT
GOWN STRL REUS W/TWL LRG LVL3 (GOWN DISPOSABLE) ×6
KIT MARKER MARGIN INK (KITS) ×3 IMPLANT
NEEDLE HYPO 25X1 1.5 SAFETY (NEEDLE) ×3 IMPLANT
NS IRRIG 1000ML POUR BTL (IV SOLUTION) ×3 IMPLANT
PACK BASIN DAY SURGERY FS (CUSTOM PROCEDURE TRAY) ×3 IMPLANT
PENCIL SMOKE EVACUATOR (MISCELLANEOUS) ×3 IMPLANT
SLEEVE SCD COMPRESS KNEE MED (MISCELLANEOUS) ×3 IMPLANT
SPONGE LAP 4X18 RFD (DISPOSABLE) ×3 IMPLANT
SUT MNCRL AB 4-0 PS2 18 (SUTURE) ×3 IMPLANT
SUT VICRYL 3-0 CR8 SH (SUTURE) ×3 IMPLANT
SYR CONTROL 10ML LL (SYRINGE) ×3 IMPLANT
TOWEL GREEN STERILE FF (TOWEL DISPOSABLE) ×3 IMPLANT
TRAY FAXITRON CT DISP (TRAY / TRAY PROCEDURE) ×3 IMPLANT

## 2020-01-13 NOTE — Interval H&P Note (Signed)
History and Physical Interval Note:  01/13/2020 10:04 AM  [name]  has presented today for surgery, with the diagnosis of LEFT BREAST DUCTAL CARCINOMA IN SITU.  The various methods of treatment have been discussed with the patient and family. After consideration of risks, benefits and other options for treatment, the patient has consented to  Procedure(s): LEFT BREAST LUMPECTOMY WITH RADIOACTIVE SEED LOCALIZATION (Left) as a surgical intervention.  [name] history has been reviewed, patient examined, no change in status, stable for surgery.  I have reviewed the patient's chart and labs.  Questions were answered to the patient's satisfaction.     Turner Daniels MD

## 2020-01-13 NOTE — Anesthesia Procedure Notes (Signed)
Procedure Name: LMA Insertion Date/Time: 01/13/2020 10:41 AM Performed by: Maryella Shivers, CRNA Pre-anesthesia Checklist: Patient identified, Emergency Drugs available, Suction available and Patient being monitored Patient Re-evaluated:Patient Re-evaluated prior to induction Oxygen Delivery Method: Circle system utilized Preoxygenation: Pre-oxygenation with 100% oxygen Induction Type: IV induction Ventilation: Mask ventilation without difficulty LMA: LMA inserted LMA Size: 4.0 Number of attempts: 1 Airway Equipment and Method: Bite block Placement Confirmation: positive ETCO2 Tube secured with: Tape Dental Injury: Teeth and Oropharynx as per pre-operative assessment

## 2020-01-13 NOTE — Discharge Instructions (Signed)
[place] Office Phone Number [phone]  BREAST BIOPSY/ PARTIAL MASTECTOMY: POST OP INSTRUCTIONS  Always review your discharge instruction sheet given to you by the facility where your surgery was performed.  IF YOU HAVE DISABILITY OR FAMILY LEAVE FORMS, YOU MUST BRING THEM TO THE OFFICE FOR PROCESSING.  DO NOT GIVE THEM TO YOUR DOCTOR.  1. A prescription for pain medication may be given to you upon discharge.  Take your pain medication as prescribed, if needed.  If narcotic pain medicine is not needed, then you may take acetaminophen (Tylenol) or ibuprofen (Advil) as needed. 2. Take your usually prescribed medications unless otherwise directed 3. If you need a refill on your pain medication, please contact your pharmacy.  They will contact our office to request authorization.  Prescriptions will not be filled after 5pm or on week-ends. 4. You should eat very light the first 24 hours after surgery, such as soup, crackers, pudding, etc.  Resume your normal diet the day after surgery. 5. Most patients will experience some swelling and bruising in the breast.  Ice packs and a good support bra will help.  Swelling and bruising can take several days to resolve.  6. It is common to experience some constipation if taking pain medication after surgery.  Increasing fluid intake and taking a stool softener will usually help or prevent this problem from occurring.  A mild laxative (Milk of Magnesia or Miralax) should be taken according to package directions if there are no bowel movements after 48 hours. 7. Unless discharge instructions indicate otherwise, you may remove your bandages 24-48 hours after surgery, and you may shower at that time.  You may have steri-strips (small skin tapes) in place directly over the incision.  These strips should be left on the skin for 7-10 days.  If your surgeon used skin glue on the incision, you may shower in 24 hours.  The glue will flake off over the  next 2-3 weeks.  Any sutures or staples will be removed at the office during your follow-up visit. 8. ACTIVITIES:  You may resume regular daily activities (gradually increasing) beginning the next day.  Wearing a good support bra or sports bra minimizes pain and swelling.  You may have sexual intercourse when it is comfortable. a. You may drive when you no longer are taking prescription pain medication, you can comfortably wear a seatbelt, and you can safely maneuver your car and apply brakes. b. RETURN TO WORK:  ______________________________________________________________________________________ 9. You should see your doctor in the office for a follow-up appointment approximately two weeks after your surgery.  Your doctor's nurse will typically make your follow-up appointment when she calls you with your pathology report.  Expect your pathology report 2-3 business days after your surgery.  You may call to check if you do not hear from [place] after three days. 10. OTHER INSTRUCTIONS: _______________________________________________________________________________________________ _____________________________________________________________________________________________________________________________________ _____________________________________________________________________________________________________________________________________ _____________________________________________________________________________________________________________________________________  WHEN TO CALL YOUR DOCTOR: 1. Fever over 101.0 2. Nausea and/or vomiting. 3. Extreme swelling or bruising. 4. Continued bleeding from incision. 5. Increased pain, redness, or drainage from the incision.  The clinic staff is available to answer your questions during regular business hours.  Please don't hesitate to call and ask to speak to one of the nurses for clinical concerns.  If you have a medical emergency, go to the nearest  emergency room or call 911.  A surgeon from [facility] is always on call at the hospital.  For further questions, please visit [email or web address]  Post Anesthesia Home Care Instructions  Activity: Get plenty of rest for the remainder of the day. A responsible individual must stay with you for 24 hours following the procedure.  For the next 24 hours, DO NOT: -Drive a car -Paediatric nurse -Drink alcoholic beverages -Take any medication unless instructed by your physician -Make any legal decisions or sign important papers.  Meals: Start with liquid foods such as gelatin or soup. Progress to regular foods as tolerated. Avoid greasy, spicy, heavy foods. If nausea and/or vomiting occur, drink only clear liquids until the nausea and/or vomiting subsides. Call your physician if vomiting continues.  Special Instructions/Symptoms: Your throat may feel dry or sore from the anesthesia or the breathing tube placed in your throat during surgery. If this causes discomfort, gargle with warm salt water. The discomfort should disappear within 24 hours.  If you had a scopolamine patch placed behind your ear for the management of post- operative nausea and/or vomiting:  1. The medication in the patch is effective for 72 hours, after which it should be removed.  Wrap patch in a tissue and discard in the trash. Wash hands thoroughly with soap and water. 2. You may remove the patch earlier than 72 hours if you experience unpleasant side effects which may include dry mouth, dizziness or visual disturbances. 3. Avoid touching the patch. Wash your hands with soap and water after contact with the patch.     NO TYLENOL PRODUCTS UNTIL 3:30    OXYCODONE 5 MG GIVEN AT 1:45 PM

## 2020-01-13 NOTE — Op Note (Signed)
Preoperative diagnosis: Left breast DCIS  Postoperative diagnosis: Same  Procedure: Left breast seed localized lumpectomy  Surgeon: Erroll Luna, MD  Anesthesia: LMA with local anesthetic consisting of 0.25% Marcaine plain  EBL: Minimal  Specimen: Left breast tissue with seed and clip verified by Faxitron  Drains: None  IV fluids: Per anesthesia record  Indications for procedure: The patient presents for left breast lumpectomy after diagnosis of DCIS by mammography and core biopsy.  We discussed breast conserving surgery and mastectomy.  She desired breast conserving surgery.The procedure has been discussed with the patient. Alternatives to surgery have been discussed with the patient.  Risks of surgery include bleeding,  Infection,  Seroma formation, death,  and the need for further surgery.   The patient understands and wishes to proceed.   Description of procedure: The patient was met in the holding area.  Neoprobe used to verify seed location left breast upper outer quadrant.  Questions were answered.  Left side marked as correct.  The patient was then brought back to the operating.  She is placed supine upon the OR table.  After induction of general esthesia left breast was prepped and draped sterile fashion timeout performed.  Proper patient, site and procedure were verified.  Neoprobe used to identify the seed left breast upper outer quadrant.  Curvilinear incision was made over this after infiltration of the skin with local anesthetic.  Dissection was carried down all tissue and the seed and clip were excised with a grossly negative margin.  The Faxitron image revealed the seed and clip as well as some previous clips from previous lumpectomy to be in the specimen with a grossly negative margin.  This was oriented with ink and sent to pathology.  The cavity was irrigated.  Local anesthetic infiltrated.  Hemostasis achieved.  Cavity closed with 3-0 Vicryl and 4 Monocryl.  Dermabond  applied.  Breast binder placed.  All counts were correct.  The patient was awoke extubated taken to recovery in satisfactory condition.

## 2020-01-13 NOTE — Anesthesia Postprocedure Evaluation (Signed)
Anesthesia Post Note  Patient: Annelle Behrendt  Procedure(s) Performed: LEFT BREAST LUMPECTOMY WITH RADIOACTIVE SEED LOCALIZATION (Left Breast)     Patient location during evaluation: PACU Anesthesia Type: General Level of consciousness: awake and alert and oriented Pain management: pain level controlled Vital Signs Assessment: post-procedure vital signs reviewed and stable Respiratory status: spontaneous breathing, nonlabored ventilation and respiratory function stable Cardiovascular status: blood pressure returned to baseline and stable Postop Assessment: no apparent nausea or vomiting Anesthetic complications: no   No complications documented.  Last Vitals:  Vitals:   01/13/20 1215 01/13/20 1222  BP: (!) 197/83   Pulse: 72 73  Resp: 15 15  Temp:    SpO2: 100% 100%    Last Pain:  Vitals:   01/13/20 0932  TempSrc: Oral  PainSc: 0-No pain                 Sally Reimers A.

## 2020-01-13 NOTE — Transfer of Care (Signed)
Immediate Anesthesia Transfer of Care Note  Patient: Christne Platts  Procedure(s) Performed: LEFT BREAST LUMPECTOMY WITH RADIOACTIVE SEED LOCALIZATION (Left Breast)  Patient Location: PACU  Anesthesia Type:General  Level of Consciousness: sedated  Airway & Oxygen Therapy: Patient Spontanous Breathing and Patient connected to face mask oxygen  Post-op Assessment: Report given to RN and Post -op Vital signs reviewed and stable  Post vital signs: Reviewed and stable  Last Vitals:  Vitals Value Taken Time  BP 164/65 01/13/20 1153  Temp    Pulse 74 01/13/20 1155  Resp 13 01/13/20 1155  SpO2 100 % 01/13/20 1155  Vitals shown include unvalidated device data.  Last Pain:  Vitals:   01/13/20 0932  TempSrc: Oral  PainSc: 0-No pain         Complications: No complications documented.

## 2020-01-13 NOTE — Anesthesia Preprocedure Evaluation (Addendum)
Anesthesia Evaluation  Patient identified by MRN, date of birth, ID band Patient awake    Reviewed: Allergy & Precautions, NPO status , Patient's Chart, lab work & pertinent test results, reviewed documented beta blocker date and time   Airway Mallampati: II  TM Distance: >3 FB Neck ROM: Full    Dental no notable dental hx. (+) Teeth Intact   Pulmonary shortness of breath and with exertion, former smoker,    Pulmonary exam normal breath sounds clear to auscultation       Cardiovascular hypertension, Pt. on medications and Pt. on home beta blockers + CAD, + Cardiac Stents, + Peripheral Vascular Disease and +CHF  Normal cardiovascular exam+ Valvular Problems/Murmurs  Rhythm:Regular Rate:Normal  Echo 12/30/14 Left ventricle: The cavity size was normal. Wall thickness was normal. Systolic function was normal. The estimated ejection fraction was in the range of 60% to 65%. Wall motion was normal; there were no regional wall motion abnormalities. Features are consistent with a pseudonormal left ventricular filling pattern, with concomitant abnormal relaxation and increased filling  pressure (grade 2 diastolic dysfunction).   EKG 10/20 SB, otherwise normal   Neuro/Psych  Headaches, PSYCHIATRIC DISORDERS Depression TIA Neuromuscular disease CVA    GI/Hepatic GERD  Medicated and Controlled,  Endo/Other  diabetes, Well Controlled, Type 2, Oral Hypoglycemic Agents, Insulin DependentObesity Left breast Ca Hyperlipidemia  Renal/GU Renal InsufficiencyRenal diseaseHx/o AKI  negative genitourinary   Musculoskeletal  (+) Arthritis , Osteoarthritis,  Fibromyalgia -  Abdominal (+) + obese,   Peds  Hematology  (+) anemia ,   Anesthesia Other Findings   Reproductive/Obstetrics                            Anesthesia Physical  Anesthesia Plan  ASA: III  Anesthesia Plan: General   Post-op Pain  Management:    Induction: Intravenous  PONV Risk Score and Plan: 3 and Ondansetron and Dexamethasone  Airway Management Planned: LMA  Additional Equipment:   Intra-op Plan:   Post-operative Plan: Extubation in OR  Informed Consent: I have reviewed the patients History and Physical, chart, labs and discussed the procedure including the risks, benefits and alternatives for the proposed anesthesia with the patient or authorized representative who has indicated his/her understanding and acceptance.     Dental advisory given  Plan Discussed with: CRNA and Surgeon  Anesthesia Plan Comments:         Anesthesia Quick Evaluation

## 2020-01-14 ENCOUNTER — Encounter (HOSPITAL_BASED_OUTPATIENT_CLINIC_OR_DEPARTMENT_OTHER): Payer: Self-pay | Admitting: Surgery

## 2020-01-15 LAB — SURGICAL PATHOLOGY

## 2020-01-18 ENCOUNTER — Encounter: Payer: Self-pay | Admitting: *Deleted

## 2020-01-23 ENCOUNTER — Other Ambulatory Visit (HOSPITAL_COMMUNITY): Payer: Medicare Other

## 2020-01-25 NOTE — Progress Notes (Signed)
Cardiology Office Note:    Date:  01/26/2020   ID:  Caidance, Sybert Mar 16, 1946, MRN [id number]  PCP:  Laurey Morale, MD  Cardiologist:  Sherren Mocha, MD   Electrophysiologist:  None   Referring MD: Laurey Morale, MD   Chief Complaint:  Follow-up (CAD)    Patient Profile:    [name] is a 74 y.o. female with:   Coronary artery disease  ? [place] in 2010 >> PCI: DES to RCA ? Cath 12/2014: mod non-obs dz in LAD, RCA stent patent  Chronic diastolic CHF ? Echo 6/16: EF 60-65, Gr 2 DD  Diabetes mellitus 2  Hypertension  ? Intol of Carvedilol (swelling)  Hyperlipidemia   S/p Prior TIA  Carotid artery dz ? S/p R CEA  GERD  Lumbar spine disease ? S/p prior fusion  Breast CA (DCIS)  S/p L lumpectomy   Prior CV studies: Carotid US 01/02/2019 R CEA site patent; L 1-39  LHC 12/29/14 LM: Ostial 30 LAD: prox 50, dist 50; D1 60 LCx: lum irregs RCA: prox 30, mid stent ok Med Rx  Echo 12/30/14 EF 60% to 65%. Wall motion was normal; Grade 2 diastolic dysfunction   Myoview 12/20/14 Possible small defect in the inferolateral wall. Ischemia cannot be completely ruled out but there is significant diaphragmatic and soft tissue attenuation on RAW images noted. This is a low risk study. EF55-65%  Renal Art [place] 10/29/11 Normal renal arteries bilaterally  History of Present Illness:    [name] was last seen in 04/2019.  She has since seen Dr. Oval Linsey and Tommy Medal, PharmD in [facility].  She returns for follow up.   She is here alone.  She continues to have issues with her back.  She was recently diagnosed with breast cancer and had a left breast lumpectomy.  She is supposed to start radiation.  She has not had chest discomfort, significant shortness of breath, syncope, leg swelling.  She brings in a list of her blood pressures.  Her pressures ranged from 120 up to 409 (systolic).  She does admit to a diet high in salt.  She was not  able to tolerate doxazosin due to leg swelling.  Past Medical History:  Diagnosis Date  . Abnormal nuclear stress test 12/29/2014  . Allergy   . Anemia, unspecified   . Arthritis   . Blood transfusion without reported diagnosis    2018  . CAD (coronary artery disease)    sees. Dr. Burt Knack. s/p cath with PCI of RCA (xience des) June 2010. Pt also with LAD and D2 dzs-NL FFR in both areas med Rx  . Carotid artery occlusion   . Cataract    sees Dr. Herbert Deaner   . Depressive disorder, not elsewhere classified   . Duodenal nodule   . Edema   . Fibromyalgia   . Gastric polyps    COLON  . GERD (gastroesophageal reflux disease)   . Glaucoma    narrow angle, sees Dr. Herbert Deaner   . Headache(784.0)   . Hearing loss    left ear  . Heart murmur    questionable  . Hemorrhoids   . HSV (herpes simplex virus) anogenital infection 05/2014  . HTN (hypertension)   . Hyperlipidemia   . Low back pain    she sees Dr. Louanne Skye, also Dr. Ernestina Patches for pain medications and Benjiman Core PA for injections   . Obesity   . Osteopenia 01/2018   T score -1.6 FRAX  7.1% / 1.4%  . Peripheral vascular disease ([place])   . Stroke [facility])    TIA  "many yrs ago"    . TIA (transient ischemic attack)    also Hx of it.   . Type II or unspecified type diabetes mellitus without mention of complication, not stated as uncontrolled    sees Dr. Carrolyn Meiers     Current Medications: Current Meds  Medication Sig  . ACCU-CHEK GUIDE test strip 1 each by Other route in the morning and at bedtime.  . diclofenac sodium (VOLTAREN) 1 % GEL Apply 2 g topically 4 (four) times daily as needed (for pain).   Marland Kitchen ergocalciferol (VITAMIN D2) 50000 UNITS capsule Take 50,000 Units by mouth every Saturday. On Saturday.  Marland Kitchen FARXIGA 10 MG TABS tablet Take 10 mg by mouth every morning.  . furosemide (LASIX) 40 MG tablet Take 1 tablet (40 mg total) by mouth daily.  Marland Kitchen HUMALOG 100 UNIT/ML injection   . hydrALAZINE (APRESOLINE) 100 MG tablet TAKE 1 TABLET BY  MOUTH THREE TIMES DAILY  . HYDROcodone-acetaminophen (NORCO/VICODIN) 5-325 MG tablet Take 1 tablet by mouth every 6 (six) hours as needed for moderate pain.  . Insulin Human (INSULIN PUMP) SOLN Inject into the skin. Uses Novolog insulin for pump  . irbesartan (AVAPRO) 300 MG tablet TAKE 1 TABLET BY MOUTH ONCE DAILY . APPOINTMENT REQUIRED FOR FUTURE REFILLS  . isosorbide mononitrate (IMDUR) 60 MG 24 hr tablet Take 1.5 tablets (90 mg total) by mouth daily.  Marland Kitchen latanoprost (XALATAN) 0.005 % ophthalmic solution Place 1 drop into both eyes at bedtime.   . metoprolol succinate (TOPROL-XL) 100 MG 24 hr tablet TAKE 1 TABLET BY MOUTH ONCE DAILY OR  IMMEDIATELY  FOLLOWING  A  MEAL  . montelukast (SINGULAIR) 10 MG tablet Take 10 mg by mouth daily.  . nitroGLYCERIN (NITROSTAT) 0.4 MG SL tablet Place 1 tablet (0.4 mg total) under the tongue every 5 (five) minutes as needed for chest pain.  Marland Kitchen nystatin-triamcinolone (MYCOLOG II) cream Apply 1 application topically 2 (two) times daily.  Marland Kitchen OZEMPIC, 1 MG/DOSE, 4 MG/3ML SOPN Inject 1 mg into the skin once a week.  . pantoprazole (PROTONIX) 40 MG tablet Take 40 mg by mouth daily.   . rosuvastatin (CRESTOR) 40 MG tablet Take 1 tablet by mouth once daily  . spironolactone (ALDACTONE) 50 MG tablet Take 1 tablet (50 mg total) by mouth daily.  . [DISCONTINUED] isosorbide mononitrate (IMDUR) 60 MG 24 hr tablet Take 1 tablet (60 mg total) by mouth daily. Please keep upcoming appt in July before anymore refills. Thank you     Allergies:   Patient has no known allergies.   Social History   Tobacco Use  . Smoking status: Former Smoker    Quit date: 01/22/1967    Years since quitting: 53.0  . Smokeless tobacco: Never Used  Vaping Use  . Vaping Use: Never used  Substance Use Topics  . Alcohol use: No    Alcohol/week: 0.0 standard drinks  . Drug use: No     Family Hx: [name] family history includes Breast cancer (age of onset: 50) in her cousin; Breast cancer  (age of onset: 56) in her maternal aunt; Breast cancer (age of onset: 36) in her cousin; Breast cancer (age of onset: 55) in her cousin; Cancer in her brother, father, and maternal aunt; Diabetes in her brother; Esophageal cancer in her cousin; Heart disease in her brother and maternal grandfather; Hyperlipidemia in her sister; Hypertension in  her brother, mother, and sister; Prostate cancer (age of onset: 26) in her cousin; Stomach cancer (age of onset: 64) in her maternal grandmother; Varicose Veins in her brother. There is no history of Coronary artery disease, Colon cancer, Colon polyps, or Rectal cancer.  ROS   EKGs/Labs/Other Test Reviewed:    EKG:  EKG is  ordered today.  The ekg ordered today demonstrates normal sinus rhythm, heart rate 68, normal axis, poor R wave progression, QTC 442, no ST-T wave changes  Recent Labs: 02/20/2019: TSH 2.60 01/12/2020: ALT 19; BUN 16; Creatinine, Ser 1.21; Hemoglobin 12.4; Platelets 182; Potassium 4.1; Sodium 143   Recent Lipid Panel Lab Results  Component Value Date/Time   CHOL 123 02/20/2019 10:22 AM   TRIG 168.0 (H) 02/20/2019 10:22 AM   HDL 39.40 02/20/2019 10:22 AM   CHOLHDL 3 02/20/2019 10:22 AM   LDLCALC 50 02/20/2019 10:22 AM    Physical Exam:    VS:  BP 130/68   Pulse 68   Ht 5\' 4"  (1.626 m)   Wt 183 lb (83 kg)   SpO2 97%   BMI 31.41 kg/m     Wt Readings from Last 3 Encounters:  01/26/20 183 lb (83 kg)  01/13/20 179 lb 7.3 oz (81.4 kg)  12/25/19 179 lb (81.2 kg)     Constitutional:      Appearance: Healthy appearance. Not in distress.  Neck:     Vascular: JVD normal.  Pulmonary:     Effort: Pulmonary effort is normal.     Breath sounds: No wheezing. No rales.  Cardiovascular:     Normal rate. Regular rhythm. Normal S1. Normal S2.     Murmurs: There is no murmur.  Pulses:    Intact distal pulses.  Edema:    Peripheral edema absent.  Abdominal:     Palpations: Abdomen is soft.  Skin:    General: Skin is warm and  dry.  Neurological:     Mental Status: Alert and oriented to person, place and time.     Cranial Nerves: Cranial nerves are intact.      ASSESSMENT & PLAN:    1. Coronary artery disease involving native coronary artery of native heart without angina pectoris History of drug-eluting stent to the RCA in 2010.  Cardiac catheterization in 2016 demonstrated patent RCA stent and moderate nonobstructive disease in the LAD.  She is doing well without angina.  She is taking a back pain pill that has ASA in it.  It looks like it is high dose ASA.  I have cautioned her about bleeding risk with this.  Continue statin, beta-blocker.   2. Chronic diastolic CHF (congestive heart failure) (HCC) Overall, volume appears to be stable.  Continue current dose of furosemide.  3. Essential hypertension Blood pressure is improved but still above target.  I have suggested that she increase her isosorbide to 90 mg daily.  I have also counseled her on reducing sodium intake.  She does admit that she likes salt.  She cannot tolerate carvedilol, clonidine, amlodipine or doxazosin.  There really are no other options at this time.  -Increase Isosorbide to 90 mg once daily  -Continue current dose of hydralazine, irbesartan, metoprolol succinate  4. Mixed hyperlipidemia Continue statin therapy.  Arrange fasting CMET, lipids prior to next visit in 6 months.    Dispo:  Return in about 6 months (around 07/28/2020) for Routine Follow Up, w/ Dr. Burt Knack, or Richardson Dopp, PA-C, in person.   Medication Adjustments/Labs and Tests  Ordered: Current medicines are reviewed at length with the patient today.  Concerns regarding medicines are outlined above.  Tests Ordered: Orders Placed This Encounter  Procedures  . EKG 12-Lead   Medication Changes: Meds ordered this encounter  Medications  . isosorbide mononitrate (IMDUR) 60 MG 24 hr tablet    Sig: Take 1.5 tablets (90 mg total) by mouth daily.    Dispense:  135 tablet     Refill:  2    Signed, Richardson Dopp, PA-C  01/26/2020 4:50 PM    [facility] [address], [place], [place]  [address] Phone: [phone]; Fax: [phone]

## 2020-01-26 ENCOUNTER — Other Ambulatory Visit: Payer: Self-pay

## 2020-01-26 ENCOUNTER — Ambulatory Visit (INDEPENDENT_AMBULATORY_CARE_PROVIDER_SITE_OTHER): Payer: Medicare Other | Admitting: Physician Assistant

## 2020-01-26 ENCOUNTER — Encounter: Payer: Self-pay | Admitting: Physician Assistant

## 2020-01-26 VITALS — BP 130/68 | HR 68 | Ht 64.0 in | Wt 183.0 lb

## 2020-01-26 DIAGNOSIS — I10 Essential (primary) hypertension: Secondary | ICD-10-CM

## 2020-01-26 DIAGNOSIS — I2510 Atherosclerotic heart disease of native coronary artery without angina pectoris: Secondary | ICD-10-CM | POA: Diagnosis not present

## 2020-01-26 DIAGNOSIS — I5032 Chronic diastolic (congestive) heart failure: Secondary | ICD-10-CM | POA: Diagnosis not present

## 2020-01-26 DIAGNOSIS — E782 Mixed hyperlipidemia: Secondary | ICD-10-CM | POA: Diagnosis not present

## 2020-01-26 MED ORDER — ISOSORBIDE MONONITRATE ER 60 MG PO TB24: 90.0000 mg | ORAL_TABLET | Freq: Every day | ORAL | 2 refills | Status: DC

## 2020-01-26 NOTE — Patient Instructions (Signed)
Medication Instructions:  Your physician has recommended you make the following change in your medication:   1) Increase Isosorbide to 90 mg, 1.5 tablets by mouth once a day  *If you need a refill on your cardiac medications before your next appointment, please call your pharmacy*  Lab Work: None ordered today  If you have labs (blood work) drawn today and your tests are completely normal, you will receive your results only by: Marland Kitchen MyChart Message (if you have MyChart) OR . A paper copy in the mail If you have any lab test that is abnormal or we need to change your treatment, we will call you to review the results.  Testing/Procedures: None ordered today  Follow-Up: At [facility], you and your health needs are our priority.  As part of our continuing mission to provide you with exceptional heart care, we have created designated Provider Care Teams.  These Care Teams include your primary Cardiologist (physician) and Advanced Practice Providers (APPs -  Physician Assistants and Nurse Practitioners) who all work together to provide you with the care you need, when you need it.  We recommend signing up for the patient portal called "MyChart".  Sign up information is provided on this After Visit Summary.  MyChart is used to connect with patients for Virtual Visits (Telemedicine).  Patients are able to view lab/test results, encounter notes, upcoming appointments, etc.  Non-urgent messages can be sent to your provider as well.   To learn more about what you can do with MyChart, go to [email or web address].    Your next appointment:   6 month(s)  The format for your next appointment:   In Person  Provider:   You may see Sherren Mocha, MD or Richardson Dopp, PA-C

## 2020-01-28 ENCOUNTER — Other Ambulatory Visit: Payer: Self-pay | Admitting: Family Medicine

## 2020-01-29 ENCOUNTER — Telehealth: Payer: Self-pay | Admitting: Nurse Practitioner

## 2020-01-29 NOTE — Telephone Encounter (Signed)
Rescheduled appointment per 7/22 provider message. Left message on patient's voicemail with updated appointment date and time.  

## 2020-02-03 NOTE — Progress Notes (Signed)
Location of Breast Cancer: Ductal carcinoma in situ (DCIS) LEFT breast, intermediate grade  Histology per Pathology Report: 01/13/2020 FINAL MICROSCOPIC DIAGNOSIS:  A. BREAST, LEFT, LUMPECTOMY:  - Intermediate grade ductal carcinoma in situ with necrosis, spanning 2  cm.  - Resection margins are negative.  - Biopsy site and cavity.  - See oncology table.   Receptor Status: ER(95%), PR (95%),   Did patient present with symptoms (if so, please note symptoms) or was this found on screening mammography?:  History of atypical hyperplasia to the left breast and underwent a lumpectomy on 01/26/2014. She tried tamoxifen and anastrozole but was not able to tolerate either due to side effects (joint pain/mobility). Another mass was found on 06/18/2017 and removed via lumpectomy on 07/24/2017. She gets annual mammograms and screening breast MRI 6 months apart. MRI on 11/27/19 showed5 mm round enhancing mass in the upper outer right breast and 2 bands of linear non mass enhancement measuring 3 cm in the right medial breast. Additionally within the lateral aspect of the left breast there is a 7x8 mm lobular enhancing mass.  Past/Anticipated interventions by surgeon, if any: 01/13/2020 Dr. Marcello Moores Cornett Left breast seed localized lumpectomy  Past/Anticipated interventions by medical oncology, if any:  Under care of Dr. Truitt Merle 12/22/2019 -Pathology and recurrence risk tool reviewed -Proceed with left lumpectomy and seed localization by Dr. Brantley Stage on 01/13/20 -Referral to rad onc to discuss adjuvant radiation  -Return after RT to discuss adjuvant AI vs surveillance only  -Continue mammogram/MRI 6 months apart regardless of AI   Lymphedema issues, if any:  None, but patient is still experiencing swelling to the breast since lumpectomy. Had follow-up with Dr. Brantley Stage on 02/01/20    Pain issues, if any:  Occasional sharp/shooting pains to both breasts, resolves on own. Patient also deals  with chronic lower back pain (slightly improved do to recent steroid injections)  SAFETY ISSUES:  Prior radiation? No  Pacemaker/ICD? No  Possible current pregnancy? No--abdominal hysterectomy at age 23  Is the patient on methotrexate? No  Current Complaints / other details:  Nothing of note

## 2020-02-04 ENCOUNTER — Ambulatory Visit (INDEPENDENT_AMBULATORY_CARE_PROVIDER_SITE_OTHER): Payer: Medicare Other | Admitting: Physical Medicine and Rehabilitation

## 2020-02-04 ENCOUNTER — Ambulatory Visit: Payer: Self-pay

## 2020-02-04 ENCOUNTER — Encounter: Payer: Self-pay | Admitting: Physical Medicine and Rehabilitation

## 2020-02-04 ENCOUNTER — Other Ambulatory Visit: Payer: Self-pay

## 2020-02-04 VITALS — BP 172/72 | HR 67

## 2020-02-04 DIAGNOSIS — M961 Postlaminectomy syndrome, not elsewhere classified: Secondary | ICD-10-CM | POA: Diagnosis not present

## 2020-02-04 DIAGNOSIS — M5116 Intervertebral disc disorders with radiculopathy, lumbar region: Secondary | ICD-10-CM

## 2020-02-04 DIAGNOSIS — M5416 Radiculopathy, lumbar region: Secondary | ICD-10-CM | POA: Diagnosis not present

## 2020-02-04 MED ORDER — DEXAMETHASONE SODIUM PHOSPHATE 10 MG/ML IJ SOLN
15.0000 mg | Freq: Once | INTRAMUSCULAR | Status: AC
  Administered 2020-02-04: 15 mg

## 2020-02-04 NOTE — Progress Notes (Signed)
Pt states right hip pain the travels down her posterior right leg. Pt states walking makes the pain worse. Pt states sitting or laying down hekps with the pain. Pt has hx of inj on 10/26/19 was good.  Numeric Pain Rating Scale and Functional Assessment Average Pain 6   In the last MONTH (on 0-10 scale) has pain interfered with the following?  1. General activity like being  able to carry out your everyday physical activities such as walking, climbing stairs, carrying groceries, or moving a chair?  Rating(8)   +Driver, -BT, -Dye Allergies.

## 2020-02-04 NOTE — Progress Notes (Addendum)
Radiation Oncology         [phone] ________________________________  Name: Asiya Cutbirth MRN: [id number]  Date: 02/05/2020  DOB: [birth year]  Follow-Up Visit Note  Outpatient  CC: Laurey Morale, MD  Alla Feeling, NP  Diagnosis:      ICD-10-CM   1. Ductal carcinoma in situ (DCIS) of left breast  D05.12       Stage 0 Left Breast DCIS, ER+ / PR+, Grade 2  CHIEF COMPLAINT: Here to discuss management of left breast DCIS  Narrative:  The patient returns today for follow-up to discuss radiation treatment options. She was seen in consultation on 01/05/2020.    She opted to proceed with left lumpectomy on date of 01/13/2020 with pathology report revealing: tumor size of 2 cm; histology of ductal carcinoma; margin status to in situ disease of >10 mm; Grade 2.    Histology per Pathology Report: 01/13/2020 FINAL MICROSCOPIC DIAGNOSIS:  A. BREAST, LEFT, LUMPECTOMY:  - Intermediate grade ductal carcinoma in situ with necrosis, spanning 2  cm.  - Resection margins are negative.  - Biopsy site and cavity.  - See oncology table.   Receptor Status: ER(95%), PR (95%),   Past/Anticipated interventions by medical oncology, if any:  Under care of Dr. Truitt Merle 12/22/2019 -Pathology and recurrence risk tool reviewed -Proceed with left lumpectomy and seed localization by Dr. Brantley Stage on 01/13/20 -Referral to rad onc to discuss adjuvant radiation  -Return after RT to discuss adjuvant AI vs surveillance only  -Continue mammogram/MRI 6 months apart regardless of AI   Lymphedema issues, if any:  None, but patient is still experiencing swelling to the breast since lumpectomy. Had follow-up with Dr. Brantley Stage on 02/01/20    Pain issues, if any:  Occasional sharp/shooting pains to both breasts, resolves on own. Patient also deals with chronic lower back pain (slightly improved do to recent steroid injections)  SAFETY ISSUES:  Prior radiation? No  Pacemaker/ICD? No  Possible  current pregnancy? No--abdominal hysterectomy at age 74  Is the patient on methotrexate? No  Current Complaints / other details:  She reports chronically dry skin related to her diabetes, she reports that she has been vaccinated for Covid.           ALLERGIES:  has No Known Allergies.  Meds: Current Outpatient Medications  Medication Sig Dispense Refill  . ACCU-CHEK GUIDE test strip 1 each by Other route in the morning and at bedtime.    . diclofenac sodium (VOLTAREN) 1 % GEL Apply 2 g topically 4 (four) times daily as needed (for pain).     Marland Kitchen ergocalciferol (VITAMIN D2) 50000 UNITS capsule Take 50,000 Units by mouth every Saturday. On Saturday.    Marland Kitchen FARXIGA 10 MG TABS tablet Take 10 mg by mouth every morning.    . furosemide (LASIX) 40 MG tablet Take 1 tablet (40 mg total) by mouth daily. 90 tablet 3  . HUMALOG 100 UNIT/ML injection     . hydrALAZINE (APRESOLINE) 100 MG tablet TAKE 1 TABLET BY MOUTH THREE TIMES DAILY 270 tablet 2  . HYDROcodone-acetaminophen (NORCO/VICODIN) 5-325 MG tablet Take 1 tablet by mouth every 6 (six) hours as needed for moderate pain. 6 tablet 0  . Insulin Human (INSULIN PUMP) SOLN Inject into the skin. Uses Novolog insulin for pump    . irbesartan (AVAPRO) 300 MG tablet TAKE 1 TABLET BY MOUTH ONCE DAILY . APPOINTMENT REQUIRED FOR FUTURE REFILLS 90 tablet 3  . isosorbide mononitrate (IMDUR) 60 MG 24 hr  tablet Take 1.5 tablets (90 mg total) by mouth daily. 135 tablet 2  . latanoprost (XALATAN) 0.005 % ophthalmic solution Place 1 drop into both eyes at bedtime.     . metoprolol succinate (TOPROL-XL) 100 MG 24 hr tablet TAKE 1 TABLET BY MOUTH ONCE DAILY OR  IMMEDIATELY  FOLLOWING  A  MEAL 90 tablet 3  . montelukast (SINGULAIR) 10 MG tablet Take 10 mg by mouth daily.    . nitroGLYCERIN (NITROSTAT) 0.4 MG SL tablet Place 1 tablet (0.4 mg total) under the tongue every 5 (five) minutes as needed for chest pain. 25 tablet 1  . nystatin-triamcinolone (MYCOLOG II) cream  Apply 1 application topically 2 (two) times daily. 30 g 2  . OZEMPIC, 1 MG/DOSE, 4 MG/3ML SOPN Inject 1 mg into the skin once a week.    . pantoprazole (PROTONIX) 40 MG tablet Take 1 tablet by mouth once daily 90 tablet 0  . rosuvastatin (CRESTOR) 40 MG tablet Take 1 tablet by mouth once daily 90 tablet 3  . spironolactone (ALDACTONE) 50 MG tablet Take 1 tablet (50 mg total) by mouth daily. 90 tablet 3   Current Facility-Administered Medications  Medication Dose Route Frequency Provider Last Rate Last Admin  . dexamethasone (DECADRON) injection 15 mg  15 mg Other Once Magnus Sinning, MD        Physical Findings:  height is 5\' 3"  (1.6 m) and weight is 183 lb 12.8 oz (83.4 kg). Her temperature is 98.5 F (36.9 C). Her blood pressure is 150/56 (abnormal) and her pulse is 71. Her respiration is 20 and oxygen saturation is 99%. .     General: Alert and oriented, in no acute distress Neurologic: No obvious focalities. Speech is fluent.  Sitting in a wheelchair Psychiatric: Judgment and insight are intact. Affect is appropriate. Breast exam reveals postoperative seroma, left breast, scar has healed well. Skin shows diffuse dryness with mild dry desquamation over her legs.   Lab Findings: Lab Results  Component Value Date   WBC 10.3 01/12/2020   HGB 12.4 01/12/2020   HCT 40.2 01/12/2020   MCV 89.1 01/12/2020   PLT 182 01/12/2020    Radiographic Findings: XR C-ARM NO REPORT  Result Date: 02/04/2020 Please see Notes tab for imaging impression.   Impression/Plan: Left Breast DCIS  We discussed adjuvant radiotherapy today.  I recommend 3.5 weeks directed at the left breast in order to reduce the risk of locoregional recurrence by 1/2.  The risks, benefits and side effects of this treatment were discussed in detail.  She understands that radiotherapy is associated with skin irritation and fatigue in the acute setting. Late effects can include cosmetic changes and rare injury to internal  organs.  She is enthusiastic about proceeding with treatment. A consent form has been signed and placed in her chart.  Simulation will take place today and we will start treatment next week.  I called the patient's son and left a message for him as she stated that he had questions about her care and she would appreciate me reaching out to speak with him.  On date of service, in total, I spent 30 minutes on this encounter. Patient was seen in person.  _____________________________________   Eppie Gibson, MD   This document serves as a record of services personally performed by Eppie Gibson, MD. It was created on her behalf by Wilburn Mylar, a trained medical scribe. The creation of this record is based on the scribe's personal observations and the provider's statements  to them. This document has been checked and approved by the attending provider.  

## 2020-02-05 ENCOUNTER — Ambulatory Visit
Admission: RE | Admit: 2020-02-05 | Discharge: 2020-02-05 | Disposition: A | Discharge status: Home / Self Care | Payer: Medicare Other | Source: Ambulatory Visit | Attending: Radiation Oncology | Admitting: Radiation Oncology

## 2020-02-05 ENCOUNTER — Encounter: Payer: Self-pay | Admitting: Radiation Oncology

## 2020-02-05 ENCOUNTER — Other Ambulatory Visit: Payer: Self-pay

## 2020-02-05 VITALS — BP 150/56 | HR 71 | Temp 98.5°F | Resp 20 | Ht 63.0 in | Wt 183.8 lb

## 2020-02-05 DIAGNOSIS — Z510 Encounter for antineoplastic radiation therapy: Secondary | ICD-10-CM | POA: Insufficient documentation

## 2020-02-05 DIAGNOSIS — D0512 Intraductal carcinoma in situ of left breast: Secondary | ICD-10-CM | POA: Diagnosis not present

## 2020-02-05 DIAGNOSIS — Z170 Estrogen receptor positive status [ER+]: Secondary | ICD-10-CM | POA: Insufficient documentation

## 2020-02-05 DIAGNOSIS — Z9889 Other specified postprocedural states: Secondary | ICD-10-CM | POA: Diagnosis not present

## 2020-02-08 DIAGNOSIS — Z510 Encounter for antineoplastic radiation therapy: Secondary | ICD-10-CM | POA: Insufficient documentation

## 2020-02-08 DIAGNOSIS — D0512 Intraductal carcinoma in situ of left breast: Secondary | ICD-10-CM | POA: Insufficient documentation

## 2020-02-11 ENCOUNTER — Encounter: Payer: Self-pay | Admitting: *Deleted

## 2020-02-11 ENCOUNTER — Other Ambulatory Visit: Payer: Self-pay

## 2020-02-11 ENCOUNTER — Ambulatory Visit (INDEPENDENT_AMBULATORY_CARE_PROVIDER_SITE_OTHER): Payer: Medicare Other | Admitting: Surgery

## 2020-02-11 ENCOUNTER — Ambulatory Visit
Admission: RE | Admit: 2020-02-11 | Discharge: 2020-02-11 | Disposition: A | Discharge status: Home / Self Care | Payer: Medicare Other | Source: Ambulatory Visit | Attending: Radiation Oncology | Admitting: Radiation Oncology

## 2020-02-11 DIAGNOSIS — I2510 Atherosclerotic heart disease of native coronary artery without angina pectoris: Secondary | ICD-10-CM | POA: Diagnosis not present

## 2020-02-11 DIAGNOSIS — M25551 Pain in right hip: Secondary | ICD-10-CM

## 2020-02-11 DIAGNOSIS — Z510 Encounter for antineoplastic radiation therapy: Secondary | ICD-10-CM | POA: Diagnosis not present

## 2020-02-11 DIAGNOSIS — D0512 Intraductal carcinoma in situ of left breast: Secondary | ICD-10-CM | POA: Diagnosis not present

## 2020-02-12 ENCOUNTER — Ambulatory Visit: Payer: Medicare Other

## 2020-02-12 ENCOUNTER — Ambulatory Visit
Admission: RE | Admit: 2020-02-12 | Discharge: 2020-02-12 | Disposition: A | Discharge status: Home / Self Care | Payer: Medicare Other | Source: Ambulatory Visit | Attending: Radiation Oncology | Admitting: Radiation Oncology

## 2020-02-12 DIAGNOSIS — Z510 Encounter for antineoplastic radiation therapy: Secondary | ICD-10-CM | POA: Diagnosis not present

## 2020-02-12 DIAGNOSIS — D0512 Intraductal carcinoma in situ of left breast: Secondary | ICD-10-CM | POA: Diagnosis not present

## 2020-02-15 ENCOUNTER — Ambulatory Visit: Admission: RE | Admit: 2020-02-15 | Payer: Medicare Other | Source: Ambulatory Visit

## 2020-02-15 ENCOUNTER — Other Ambulatory Visit: Payer: Self-pay

## 2020-02-16 ENCOUNTER — Other Ambulatory Visit: Payer: Self-pay

## 2020-02-16 ENCOUNTER — Ambulatory Visit
Admission: RE | Admit: 2020-02-16 | Discharge: 2020-02-16 | Disposition: A | Discharge status: Home / Self Care | Payer: Medicare Other | Source: Ambulatory Visit | Attending: Radiation Oncology | Admitting: Radiation Oncology

## 2020-02-16 DIAGNOSIS — Z510 Encounter for antineoplastic radiation therapy: Secondary | ICD-10-CM | POA: Diagnosis not present

## 2020-02-16 DIAGNOSIS — D0512 Intraductal carcinoma in situ of left breast: Secondary | ICD-10-CM | POA: Diagnosis not present

## 2020-02-16 MED ORDER — RADIAPLEXRX EX GEL: Freq: Two times a day (BID) | CUTANEOUS | Status: DC

## 2020-02-16 MED ORDER — ALRA NON-METALLIC DEODORANT (RAD-ONC)
1.0000 "application " | Freq: Once | TOPICAL | Status: AC
  Administered 2020-02-16: 1 via TOPICAL

## 2020-02-16 NOTE — Progress Notes (Signed)
Pt here for patient teaching.  Pt given Radiation and You booklet, skin care instructions, Alra deodorant and Radiaplex gel.  Reviewed areas of pertinence such as fatigue, skin changes, breast tenderness and breast swelling . Pt able to give teach back of to pat skin and use unscented/gentle soap,apply Radiaplex bid, avoid applying anything to skin within 4 hours of treatment, avoid wearing an under wire bra and to use an electric razor if they must shave. Pt verbalizes understanding of information given and will contact nursing with any questions or concerns.     [email or web address]       

## 2020-02-17 ENCOUNTER — Ambulatory Visit
Admission: RE | Admit: 2020-02-17 | Discharge: 2020-02-17 | Disposition: A | Discharge status: Home / Self Care | Payer: Medicare Other | Source: Ambulatory Visit | Attending: Radiation Oncology | Admitting: Radiation Oncology

## 2020-02-17 ENCOUNTER — Other Ambulatory Visit: Payer: Self-pay

## 2020-02-17 DIAGNOSIS — D0512 Intraductal carcinoma in situ of left breast: Secondary | ICD-10-CM | POA: Diagnosis not present

## 2020-02-17 DIAGNOSIS — Z510 Encounter for antineoplastic radiation therapy: Secondary | ICD-10-CM | POA: Diagnosis not present

## 2020-02-18 ENCOUNTER — Ambulatory Visit
Admission: RE | Admit: 2020-02-18 | Discharge: 2020-02-18 | Disposition: A | Discharge status: Home / Self Care | Payer: Medicare Other | Source: Ambulatory Visit | Attending: Radiation Oncology | Admitting: Radiation Oncology

## 2020-02-18 ENCOUNTER — Other Ambulatory Visit: Payer: Self-pay

## 2020-02-18 DIAGNOSIS — Z510 Encounter for antineoplastic radiation therapy: Secondary | ICD-10-CM | POA: Diagnosis not present

## 2020-02-18 DIAGNOSIS — D0512 Intraductal carcinoma in situ of left breast: Secondary | ICD-10-CM | POA: Diagnosis not present

## 2020-02-19 ENCOUNTER — Other Ambulatory Visit: Payer: Self-pay

## 2020-02-19 ENCOUNTER — Ambulatory Visit
Admission: RE | Admit: 2020-02-19 | Discharge: 2020-02-19 | Disposition: A | Discharge status: Home / Self Care | Payer: Medicare Other | Source: Ambulatory Visit | Attending: Radiation Oncology | Admitting: Radiation Oncology

## 2020-02-19 DIAGNOSIS — D0512 Intraductal carcinoma in situ of left breast: Secondary | ICD-10-CM | POA: Diagnosis not present

## 2020-02-19 DIAGNOSIS — Z510 Encounter for antineoplastic radiation therapy: Secondary | ICD-10-CM | POA: Diagnosis not present

## 2020-02-22 ENCOUNTER — Ambulatory Visit
Admission: RE | Admit: 2020-02-22 | Discharge: 2020-02-22 | Disposition: A | Discharge status: Home / Self Care | Payer: Medicare Other | Source: Ambulatory Visit | Attending: Radiation Oncology | Admitting: Radiation Oncology

## 2020-02-22 ENCOUNTER — Other Ambulatory Visit: Payer: Self-pay

## 2020-02-22 DIAGNOSIS — Z510 Encounter for antineoplastic radiation therapy: Secondary | ICD-10-CM | POA: Diagnosis not present

## 2020-02-22 DIAGNOSIS — D0512 Intraductal carcinoma in situ of left breast: Secondary | ICD-10-CM | POA: Diagnosis not present

## 2020-02-23 ENCOUNTER — Other Ambulatory Visit: Payer: Self-pay

## 2020-02-23 ENCOUNTER — Ambulatory Visit
Admission: RE | Admit: 2020-02-23 | Discharge: 2020-02-23 | Disposition: A | Discharge status: Home / Self Care | Payer: Medicare Other | Source: Ambulatory Visit | Attending: Radiation Oncology | Admitting: Radiation Oncology

## 2020-02-23 DIAGNOSIS — Z794 Long term (current) use of insulin: Secondary | ICD-10-CM | POA: Diagnosis not present

## 2020-02-23 DIAGNOSIS — I129 Hypertensive chronic kidney disease with stage 1 through stage 4 chronic kidney disease, or unspecified chronic kidney disease: Secondary | ICD-10-CM | POA: Diagnosis not present

## 2020-02-23 DIAGNOSIS — D126 Benign neoplasm of colon, unspecified: Secondary | ICD-10-CM | POA: Diagnosis not present

## 2020-02-23 DIAGNOSIS — D0512 Intraductal carcinoma in situ of left breast: Secondary | ICD-10-CM | POA: Diagnosis not present

## 2020-02-23 DIAGNOSIS — N1831 Chronic kidney disease, stage 3a: Secondary | ICD-10-CM | POA: Diagnosis not present

## 2020-02-23 DIAGNOSIS — Z510 Encounter for antineoplastic radiation therapy: Secondary | ICD-10-CM | POA: Diagnosis not present

## 2020-02-23 DIAGNOSIS — E1159 Type 2 diabetes mellitus with other circulatory complications: Secondary | ICD-10-CM | POA: Diagnosis not present

## 2020-02-24 ENCOUNTER — Other Ambulatory Visit: Payer: Self-pay

## 2020-02-24 ENCOUNTER — Ambulatory Visit
Admission: RE | Admit: 2020-02-24 | Discharge: 2020-02-24 | Disposition: A | Discharge status: Home / Self Care | Payer: Medicare Other | Source: Ambulatory Visit | Attending: Radiation Oncology | Admitting: Radiation Oncology

## 2020-02-24 DIAGNOSIS — D0512 Intraductal carcinoma in situ of left breast: Secondary | ICD-10-CM | POA: Diagnosis not present

## 2020-02-24 DIAGNOSIS — Z510 Encounter for antineoplastic radiation therapy: Secondary | ICD-10-CM | POA: Diagnosis not present

## 2020-02-25 ENCOUNTER — Ambulatory Visit
Admission: RE | Admit: 2020-02-25 | Discharge: 2020-02-25 | Disposition: A | Discharge status: Home / Self Care | Payer: Medicare Other | Source: Ambulatory Visit | Attending: Radiation Oncology | Admitting: Radiation Oncology

## 2020-02-25 ENCOUNTER — Other Ambulatory Visit: Payer: Self-pay

## 2020-02-25 DIAGNOSIS — Z510 Encounter for antineoplastic radiation therapy: Secondary | ICD-10-CM | POA: Diagnosis not present

## 2020-02-25 DIAGNOSIS — D0512 Intraductal carcinoma in situ of left breast: Secondary | ICD-10-CM | POA: Diagnosis not present

## 2020-02-26 ENCOUNTER — Other Ambulatory Visit: Payer: Self-pay

## 2020-02-26 ENCOUNTER — Ambulatory Visit
Admission: RE | Admit: 2020-02-26 | Discharge: 2020-02-26 | Disposition: A | Discharge status: Home / Self Care | Payer: Medicare Other | Source: Ambulatory Visit | Attending: Radiation Oncology | Admitting: Radiation Oncology

## 2020-02-26 DIAGNOSIS — D0512 Intraductal carcinoma in situ of left breast: Secondary | ICD-10-CM | POA: Diagnosis not present

## 2020-02-26 DIAGNOSIS — Z510 Encounter for antineoplastic radiation therapy: Secondary | ICD-10-CM | POA: Diagnosis not present

## 2020-02-29 ENCOUNTER — Other Ambulatory Visit: Payer: Self-pay

## 2020-02-29 ENCOUNTER — Ambulatory Visit
Admission: RE | Admit: 2020-02-29 | Discharge: 2020-02-29 | Disposition: A | Discharge status: Home / Self Care | Payer: Medicare Other | Source: Ambulatory Visit | Attending: Radiation Oncology | Admitting: Radiation Oncology

## 2020-02-29 DIAGNOSIS — Z510 Encounter for antineoplastic radiation therapy: Secondary | ICD-10-CM | POA: Diagnosis not present

## 2020-02-29 DIAGNOSIS — D0512 Intraductal carcinoma in situ of left breast: Secondary | ICD-10-CM | POA: Diagnosis not present

## 2020-03-01 ENCOUNTER — Ambulatory Visit
Admission: RE | Admit: 2020-03-01 | Discharge: 2020-03-01 | Disposition: A | Discharge status: Home / Self Care | Payer: Medicare Other | Source: Ambulatory Visit | Attending: Radiation Oncology | Admitting: Radiation Oncology

## 2020-03-01 ENCOUNTER — Other Ambulatory Visit: Payer: Self-pay

## 2020-03-01 DIAGNOSIS — Z510 Encounter for antineoplastic radiation therapy: Secondary | ICD-10-CM | POA: Diagnosis not present

## 2020-03-01 DIAGNOSIS — D0512 Intraductal carcinoma in situ of left breast: Secondary | ICD-10-CM | POA: Diagnosis not present

## 2020-03-01 NOTE — Progress Notes (Signed)
Office Visit Note   Patient: [name]           Date of Birth: [birth year]           MRN: [id number] Visit Date: 02/11/2020              Requested by: Laurey Morale, MD [place],  [place] [address] PCP: Laurey Morale, MD   Assessment & Plan: Visit Diagnoses:  1. Pain in right hip     Plan: Advised patient that I cannot do an injection into the right buttock area where she is more symptomatic.  Recommend that she come back in a couple weeks for recheck to see how she does.  If the lateral hip is more painful I will plan to do a greater trochanter bursa Marcaine/Depo-Medrol injection at that time.    Follow-Up Instructions: Return in about 2 weeks (around 02/25/2020).   Orders:  No orders of the defined types were placed in this encounter.  No orders of the defined types were placed in this encounter.     Procedures: No procedures performed   Clinical Data: No additional findings.   Subjective: Chief Complaint  Patient presents with   Right Leg - Pain    HPI 74 year old female comes in today with complaints of right buttock pain and lateral hip pain.  Patient has documented history of lumbar fusion.  She describes having pain more into the right buttock.  Occasionally has some pain radiating further down her leg.  No symptoms on the left side.  Pain when she is ambulating and sitting.  She is asking about having an injection. Review of Systems No current cardiac pulmonary GI GU issues  Objective: Vital Signs: There were no vitals taken for this visit.  Physical Exam HENT:     Head: Normocephalic and atraumatic.  Eyes:     Extraocular Movements: Extraocular movements intact.     Pupils: Pupils are equal, round, and reactive to light.  Pulmonary:     Effort: No respiratory distress.  Musculoskeletal:     Comments: Gait is antalgic.  She is more tender over the right sciatic notch than the right lateral hip.  Negative logroll.   Negative straight leg raise.  No focal motor deficits.  Neurological:     General: No focal deficit present.     Mental Status: She is alert and oriented to person, place, and time.     Ortho Exam  Specialty Comments:  No specialty comments available.  Imaging: No results found.   PMFS History: Patient Active Problem List   Diagnosis Date Noted   Ductal carcinoma in situ (DCIS) of left breast 01/06/2020   Right-sided thoracic back pain 07/01/2019   Hyperlipidemia 07/17/2017   History of lumbar spinal fusion    History of fusion of lumbar spine    Anemia due to blood loss 11/02/2016    Class: Acute   Lethargy 10/31/2016   AKI (acute kidney injury) ([place]) 10/31/2016   Chronic diastolic CHF (congestive heart failure) ([place]) 10/31/2016   Cough    Leukocytosis    Spondylolisthesis, lumbar region 10/30/2016    Class: Chronic   Spinal stenosis of lumbar region 10/30/2016    Class: Chronic   Retained orthopedic hardware 10/30/2016    Class: Chronic   Spinal stenosis of lumbar region with neurogenic claudication 10/30/2016   Genetic testing 09/14/2015   Atypical ductal hyperplasia of left breast 08/24/2015   Family history of breast cancer  Family history of prostate cancer    Family history of stomach cancer    Abnormal nuclear stress test 12/29/2014   Cervical spondylosis with myelopathy and radiculopathy 06/11/2014   Carpal tunnel syndrome 05/14/2014   Aftercare following surgery of the circulatory system, NEC 03/11/2014   Pain of right lower extremity 03/11/2014   Atypical lobular hyperplasia of left breast 02/12/2014   Fibromyalgia 09/21/2013   Carotid artery disease ([place]) 02/29/2012   Back abscess 01/22/2012   Chest pain with moderate risk of acute coronary syndrome 12/09/2009   Shortness of breath 06/10/2009   Orthostatic hypotension 01/31/2009   CAD (coronary artery disease) 12/23/2008   Normocytic anemia 07/21/2008    Headache(784.0) 07/21/2008   History of TIA (transient ischemic attack) 07/21/2008   Edema 02/27/2008   Diabetes mellitus with complication ([place]) 24/40/1027   Depression 07/16/2007   Dyslipidemia 02/21/2007   Essential hypertension 02/21/2007   Asthma 02/21/2007   GERD 02/21/2007   LOW BACK PAIN 02/21/2007   Past Medical History:  Diagnosis Date   Abnormal nuclear stress test 12/29/2014   Allergy    Anemia, unspecified    Arthritis    Blood transfusion without reported diagnosis    2018   CAD (coronary artery disease)    sees. Dr. Burt Knack. s/p cath with PCI of RCA (xience des) June 2010. Pt also with LAD and D2 dzs-NL FFR in both areas med Rx   Carotid artery occlusion    Cataract    sees Dr. Herbert Deaner    Depressive disorder, not elsewhere classified    Duodenal nodule    Edema    Fibromyalgia    Gastric polyps    COLON   GERD (gastroesophageal reflux disease)    Glaucoma    narrow angle, sees Dr. Shelly Flatten)    Hearing loss    left ear   Heart murmur    questionable   Hemorrhoids    HSV (herpes simplex virus) anogenital infection 05/2014   HTN (hypertension)    Hyperlipidemia    Low back pain    she sees Dr. Louanne Skye, also Dr. Ernestina Patches for pain medications and Benjiman Core PA for injections    Obesity    Osteopenia 01/2018   T score -1.6 FRAX 7.1% / 1.4%   Peripheral vascular disease ([place])    Stroke ([place])    TIA  "many yrs ago"     TIA (transient ischemic attack)    also Hx of it.    Type II or unspecified type diabetes mellitus without mention of complication, not stated as uncontrolled    sees Dr. Carrolyn Meiers     Family History  Problem Relation Age of Onset   Hypertension Mother    Hypertension Sister    Hyperlipidemia Sister    Hypertension Brother    Cancer Brother        PROSTATE/LUNG   Diabetes Brother    Heart disease Brother        Before age 70 - Bypass   Varicose Veins Brother    Cancer  Father        Prostate and pancreatic    Stomach cancer Maternal Grandmother 65   Heart disease Maternal Grandfather    Breast cancer Maternal Aunt 55   Cancer Maternal Aunt        Stomach   Breast cancer Cousin 92       maternal first cousin   Prostate cancer Cousin 65   Breast cancer Cousin 20  Esophageal cancer Cousin    Breast cancer Cousin 71       maternal first cousin's daughter   Coronary artery disease Neg Hx        premature CAD   Colon cancer Neg Hx    Colon polyps Neg Hx    Rectal cancer Neg Hx     Past Surgical History:  Procedure Laterality Date   ABDOMINAL HYSTERECTOMY  age 43   TAH and USO  Leiomyomata   ANTERIOR CERVICAL CORPECTOMY N/A 06/11/2014   Procedure: ANTERIOR CERVICAL CORPECTOMY CERVICAL SIX; WITH CERVICAL FIVE TO CERVICAL SEVEN ARTHRODESIS;  Surgeon: Ashok Pall, MD;  Location: [facility];  Service: Neurosurgery;  Laterality: N/A;   BACK SURGERY  2008   lumb fusion   BREAST BIOPSY Left 01/26/2014   Procedure: EXCISION LEFT BREAST MASS;  Surgeon: Adin Hector, MD;  Location: [facility];  Service: General;  Laterality: Left;   BREAST LUMPECTOMY WITH RADIOACTIVE SEED LOCALIZATION Left 07/24/2017   Procedure: LEFT BREAST LUMPECTOMY WITH RADIOACTIVE SEED LOCALIZATION ERAS;  Surgeon: Erroll Luna, MD;  Location: [facility];  Service: General;  Laterality: Left;   BREAST LUMPECTOMY WITH RADIOACTIVE SEED LOCALIZATION Left 01/13/2020   Procedure: LEFT BREAST LUMPECTOMY WITH RADIOACTIVE SEED LOCALIZATION;  Surgeon: Erroll Luna, MD;  Location: [facility];  Service: General;  Laterality: Left;   CARDIAC CATHETERIZATION     CARDIAC CATHETERIZATION N/A 12/29/2014   Procedure: Left Heart Cath and Coronary Angiography;  Surgeon: Sherren Mocha, MD;  Location: [facility];  Service: Cardiovascular;  Laterality: N/A;   CAROTID ENDARTERECTOMY  march 2012   per Dr. Morton Amy    CARPAL TUNNEL RELEASE Left 05/14/2014   Procedure: Left Carpal tunnel release;  Surgeon: Ashok Pall, MD;  Location: [facility];  Service: Neurosurgery;  Laterality: Left;  Left Carpal tunnel release   CARPAL TUNNEL RELEASE Bilateral    CHOLECYSTECTOMY     COLONOSCOPY  06/26/2017   per Dr. Henrene Pastor, adenomatous polyp, repeat in 5 yrs     CORONARY STENT PLACEMENT     Dr. Burt Knack   DILATION AND CURETTAGE OF UTERUS     ESOPHAGOGASTRODUODENOSCOPY  02-08-06   per Dr. Henrene Pastor, gastritis    EYE SURGERY Left June 2016   Cataract   GLAUCOMA SURGERY     with laser, per Dr. Henrene Pastor    HARDWARE REMOVAL  10/30/2016   Procedure: HARDWARE REMOVAL;  Surgeon: Jessy Oto, MD;  Location: [facility];  Service: Orthopedics;;   LUMBAR FUSION  2008   POLYPECTOMY     POSTERIOR LUMBAR FUSION  10/30/2016   Removal of hardware rods and pedicle screws lumbar four, Extension of fusion L4-5 to L5-S1 with reinsertion of screws at L 4, left L5-S1 transforaminal lumbar interbody fusion, Exploration  left L5 nerve root, bilateral decompressive laminectomy L3-4/notes 10/30/2016   ULNAR NERVE TRANSPOSITION Left 05/14/2014   Procedure: Left Ulnar nerve decompression;  Surgeon: Ashok Pall, MD;  Location: [facility];  Service: Neurosurgery;  Laterality: Left;  Left Ulnar nerve decompression   Social History   Occupational History   Not on file  Tobacco Use   Smoking status: Former Smoker    Quit date: 01/22/1967    Years since quitting: 53.2   Smokeless tobacco: Never Used  Vaping Use   Vaping Use: Never used  Substance and Sexual Activity   Alcohol use: No    Alcohol/week: 0.0 standard drinks   Drug use: No  Sexual activity: Not Currently    Birth control/protection: Surgical, Post-menopausal    Comment: HYST-1st intercourse 74 yo-5 partners

## 2020-03-02 ENCOUNTER — Encounter: Payer: Self-pay | Admitting: Surgery

## 2020-03-02 ENCOUNTER — Ambulatory Visit (INDEPENDENT_AMBULATORY_CARE_PROVIDER_SITE_OTHER): Payer: Medicare Other | Admitting: Surgery

## 2020-03-02 ENCOUNTER — Ambulatory Visit: Payer: Medicare Other

## 2020-03-02 ENCOUNTER — Other Ambulatory Visit: Payer: Self-pay

## 2020-03-02 ENCOUNTER — Ambulatory Visit
Admission: RE | Admit: 2020-03-02 | Discharge: 2020-03-02 | Disposition: A | Discharge status: Home / Self Care | Payer: Medicare Other | Source: Ambulatory Visit | Attending: Radiation Oncology | Admitting: Radiation Oncology

## 2020-03-02 VITALS — BP 110/58 | HR 75 | Ht 63.0 in | Wt 183.8 lb

## 2020-03-02 DIAGNOSIS — I2510 Atherosclerotic heart disease of native coronary artery without angina pectoris: Secondary | ICD-10-CM

## 2020-03-02 DIAGNOSIS — D0512 Intraductal carcinoma in situ of left breast: Secondary | ICD-10-CM | POA: Diagnosis not present

## 2020-03-02 DIAGNOSIS — M7061 Trochanteric bursitis, right hip: Secondary | ICD-10-CM

## 2020-03-02 DIAGNOSIS — Z510 Encounter for antineoplastic radiation therapy: Secondary | ICD-10-CM | POA: Diagnosis not present

## 2020-03-02 MED ORDER — BUPIVACAINE HCL 0.25 % IJ SOLN
6.0000 mL | INTRAMUSCULAR | Status: AC | PRN
  Administered 2020-03-02: 6 mL via INTRA_ARTICULAR

## 2020-03-02 MED ORDER — LIDOCAINE HCL 1 % IJ SOLN
3.0000 mL | INTRAMUSCULAR | Status: AC | PRN
  Administered 2020-03-02: 3 mL

## 2020-03-02 MED ORDER — METHYLPREDNISOLONE ACETATE 40 MG/ML IJ SUSP
40.0000 mg | INTRAMUSCULAR | Status: AC | PRN
Start: 2020-03-02 — End: 2020-03-02
  Administered 2020-03-02: 40 mg via INTRA_ARTICULAR

## 2020-03-02 NOTE — Progress Notes (Signed)
Office Visit Note   Patient: [name]           Date of Birth: 1945/07/15           MRN: [id number] Visit Date: 03/02/2020              Requested by: Laurey Morale, MD [place],  [place] [address] PCP: Laurey Morale, MD   Assessment & Plan: Visit Diagnoses:  1. Greater trochanteric bursitis, right     Plan: In office given patient relief of her right lateral hip pain offered injection.  CBG in clinic today 122.  After patient consent right lateral hip was prepped with Betadine and greater trochanter bursa Marcaine/Depo-Medrol injection performed.  After sitting for a few minutes patient reported complete relief of her lateral hip pain with anesthetic in place.  Follow-up with [place] in 6 weeks for recheck.  Return sooner if needed.  Follow-Up Instructions: Return in about 6 weeks (around 04/13/2020).   Orders:  No orders of the defined types were placed in this encounter.  No orders of the defined types were placed in this encounter.     Procedures: Large Joint Inj: R greater trochanter on 03/02/2020 10:05 AM Details: 22 G 3.5 in needle, lateral approach Medications: 3 mL lidocaine 1 %; 6 mL bupivacaine 0.25 %; 40 mg methylPREDNISolone acetate 40 MG/ML Outcome: tolerated well, no immediate complications Consent was given by the patient. Patient was prepped and draped in the usual sterile fashion.       Clinical Data: No additional findings.   Subjective: Chief Complaint  Patient presents with   Right Hip - Follow-up    HPI 74 year old black female returns today with complaints of right hip pain.  Today she is localizing more of a pain around the greater trochanter bursa.  Pain when she is laying on her right side.  Also aggravated with ambulation.  States that this has worsened since last visit.  Patient does have history of diabetes.   Objective: Vital Signs: BP (!) 110/58    Pulse 75    Ht 5\' 3"  (1.6 m)    Wt 183 lb 12.8 oz (83.4  kg)    BMI 32.56 kg/m   Physical Exam Pleasant female alert and oriented in no acute distress.  Gait is antalgic.  Negative straight leg raise.  Negative logroll right hip.  Patient has marked tenderness over the right hip greater trochanter bursa and this extends midway down the IT band. Ortho Exam  Specialty Comments:  No specialty comments available.  Imaging: No results found.   PMFS History: Patient Active Problem List   Diagnosis Date Noted   Ductal carcinoma in situ (DCIS) of left breast 01/06/2020   Right-sided thoracic back pain 07/01/2019   Hyperlipidemia 07/17/2017   History of lumbar spinal fusion    History of fusion of lumbar spine    Anemia due to blood loss 11/02/2016    Class: Acute   Lethargy 10/31/2016   AKI (acute kidney injury) ([facility]) 10/31/2016   Chronic diastolic CHF (congestive heart failure) ([facility]) 10/31/2016   Cough    Leukocytosis    Spondylolisthesis, lumbar region 10/30/2016    Class: Chronic   Spinal stenosis of lumbar region 10/30/2016    Class: Chronic   Retained orthopedic hardware 10/30/2016    Class: Chronic   Spinal stenosis of lumbar region with neurogenic claudication 10/30/2016   Genetic testing 09/14/2015   Atypical ductal hyperplasia of left breast 08/24/2015  Family history of breast cancer    Family history of prostate cancer    Family history of stomach cancer    Abnormal nuclear stress test 12/29/2014   Cervical spondylosis with myelopathy and radiculopathy 06/11/2014   Carpal tunnel syndrome 05/14/2014   Aftercare following surgery of the circulatory system, NEC 03/11/2014   Pain of right lower extremity 03/11/2014   Atypical lobular hyperplasia of left breast 02/12/2014   Fibromyalgia 09/21/2013   Carotid artery disease ([facility]) 02/29/2012   Back abscess 01/22/2012   Chest pain with moderate risk of acute coronary syndrome 12/09/2009   Shortness of breath 06/10/2009   Orthostatic hypotension  01/31/2009   CAD (coronary artery disease) 12/23/2008   Normocytic anemia 07/21/2008   Headache(784.0) 07/21/2008   History of TIA (transient ischemic attack) 07/21/2008   Edema 02/27/2008   Diabetes mellitus with complication ([facility]) 79/15/0569   Depression 07/16/2007   Dyslipidemia 02/21/2007   Essential hypertension 02/21/2007   ASTHMA 02/21/2007   GERD 02/21/2007   LOW BACK PAIN 02/21/2007   Past Medical History:  Diagnosis Date   Abnormal nuclear stress test 12/29/2014   Allergy    Anemia, unspecified    Arthritis    Blood transfusion without reported diagnosis    2018   CAD (coronary artery disease)    sees. Dr. Burt Knack. s/p cath with PCI of RCA (xience des) June 2010. Pt also with LAD and D2 dzs-NL FFR in both areas med Rx   Carotid artery occlusion    Cataract    sees Dr. Herbert Deaner    Depressive disorder, not elsewhere classified    Duodenal nodule    Edema    Fibromyalgia    Gastric polyps    COLON   GERD (gastroesophageal reflux disease)    Glaucoma    narrow angle, sees Dr. Shelly Flatten)    Hearing loss    left ear   Heart murmur    questionable   Hemorrhoids    HSV (herpes simplex virus) anogenital infection 05/2014   HTN (hypertension)    Hyperlipidemia    Low back pain    she sees Dr. Louanne Skye, also Dr. Ernestina Patches for pain medications and Benjiman Core PA for injections    Obesity    Osteopenia 01/2018   T score -1.6 FRAX 7.1% / 1.4%   Peripheral vascular disease ([facility])    Stroke ([place])    TIA  "many yrs ago"     TIA (transient ischemic attack)    also Hx of it.    Type II or unspecified type diabetes mellitus without mention of complication, not stated as uncontrolled    sees Dr. Carrolyn Meiers     Family History  Problem Relation Age of Onset   Hypertension Mother    Hypertension Sister    Hyperlipidemia Sister    Hypertension Brother    Cancer Brother        PROSTATE/LUNG   Diabetes Brother     Heart disease Brother        Before age 76 - Bypass   Varicose Veins Brother    Cancer Father        Prostate and pancreatic    Stomach cancer Maternal Grandmother 65   Heart disease Maternal Grandfather    Breast cancer Maternal Aunt 55   Cancer Maternal Aunt        Stomach   Breast cancer Cousin 104       maternal first cousin   Prostate cancer Cousin  73   Breast cancer Cousin 50   Esophageal cancer Cousin    Breast cancer Cousin 68       maternal first cousin's daughter   Coronary artery disease Neg Hx        premature CAD   Colon cancer Neg Hx    Colon polyps Neg Hx    Rectal cancer Neg Hx     Past Surgical History:  Procedure Laterality Date   ABDOMINAL HYSTERECTOMY  age 30   TAH and USO  Leiomyomata   ANTERIOR CERVICAL CORPECTOMY N/A 06/11/2014   Procedure: ANTERIOR CERVICAL CORPECTOMY CERVICAL SIX; WITH CERVICAL FIVE TO CERVICAL SEVEN ARTHRODESIS;  Surgeon: Ashok Pall, MD;  Location: [facility];  Service: Neurosurgery;  Laterality: N/A;   BACK SURGERY  2008   lumb fusion   BREAST BIOPSY Left 01/26/2014   Procedure: EXCISION LEFT BREAST MASS;  Surgeon: Adin Hector, MD;  Location: [facility];  Service: General;  Laterality: Left;   BREAST LUMPECTOMY WITH RADIOACTIVE SEED LOCALIZATION Left 07/24/2017   Procedure: LEFT BREAST LUMPECTOMY WITH RADIOACTIVE SEED LOCALIZATION ERAS;  Surgeon: Erroll Luna, MD;  Location: [facility];  Service: General;  Laterality: Left;   BREAST LUMPECTOMY WITH RADIOACTIVE SEED LOCALIZATION Left 01/13/2020   Procedure: LEFT BREAST LUMPECTOMY WITH RADIOACTIVE SEED LOCALIZATION;  Surgeon: Erroll Luna, MD;  Location: [facility];  Service: General;  Laterality: Left;   CARDIAC CATHETERIZATION     CARDIAC CATHETERIZATION N/A 12/29/2014   Procedure: Left Heart Cath and Coronary Angiography;  Surgeon: Sherren Mocha, MD;  Location: [facility];  Service: Cardiovascular;   Laterality: N/A;   CAROTID ENDARTERECTOMY  march 2012   per Dr. Morton Amy   CARPAL TUNNEL RELEASE Left 05/14/2014   Procedure: Left Carpal tunnel release;  Surgeon: Ashok Pall, MD;  Location: [facility];  Service: Neurosurgery;  Laterality: Left;  Left Carpal tunnel release   CARPAL TUNNEL RELEASE Bilateral    CHOLECYSTECTOMY     COLONOSCOPY  06/26/2017   per Dr. Henrene Pastor, adenomatous polyp, repeat in 5 yrs     CORONARY STENT PLACEMENT     Dr. Burt Knack   DILATION AND CURETTAGE OF UTERUS     ESOPHAGOGASTRODUODENOSCOPY  02-08-06   per Dr. Henrene Pastor, gastritis    EYE SURGERY Left June 2016   Cataract   GLAUCOMA SURGERY     with laser, per Dr. Henrene Pastor    HARDWARE REMOVAL  10/30/2016   Procedure: HARDWARE REMOVAL;  Surgeon: Jessy Oto, MD;  Location: [facility];  Service: Orthopedics;;   LUMBAR FUSION  2008   POLYPECTOMY     POSTERIOR LUMBAR FUSION  10/30/2016   Removal of hardware rods and pedicle screws lumbar four, Extension of fusion L4-5 to L5-S1 with reinsertion of screws at L 4, left L5-S1 transforaminal lumbar interbody fusion, Exploration  left L5 nerve root, bilateral decompressive laminectomy L3-4/notes 10/30/2016   ULNAR NERVE TRANSPOSITION Left 05/14/2014   Procedure: Left Ulnar nerve decompression;  Surgeon: Ashok Pall, MD;  Location: [facility];  Service: Neurosurgery;  Laterality: Left;  Left Ulnar nerve decompression   Social History   Occupational History   Not on file  Tobacco Use   Smoking status: Former Smoker    Quit date: 01/22/1967    Years since quitting: 53.1   Smokeless tobacco: Never Used  Vaping Use   Vaping Use: Never used  Substance and Sexual Activity   Alcohol use: No    Alcohol/week: 0.0  standard drinks   Drug use: No   Sexual activity: Not Currently    Birth control/protection: Surgical, Post-menopausal    Comment: HYST-1st intercourse 26 yo-5 partners

## 2020-03-03 ENCOUNTER — Ambulatory Visit: Payer: Medicare Other

## 2020-03-03 ENCOUNTER — Other Ambulatory Visit: Payer: Self-pay

## 2020-03-03 ENCOUNTER — Encounter: Payer: Self-pay | Admitting: *Deleted

## 2020-03-03 ENCOUNTER — Ambulatory Visit
Admission: RE | Admit: 2020-03-03 | Discharge: 2020-03-03 | Disposition: A | Discharge status: Home / Self Care | Payer: Medicare Other | Source: Ambulatory Visit | Attending: Radiation Oncology | Admitting: Radiation Oncology

## 2020-03-03 DIAGNOSIS — D0512 Intraductal carcinoma in situ of left breast: Secondary | ICD-10-CM | POA: Diagnosis not present

## 2020-03-03 DIAGNOSIS — Z510 Encounter for antineoplastic radiation therapy: Secondary | ICD-10-CM | POA: Diagnosis not present

## 2020-03-04 ENCOUNTER — Encounter: Payer: Self-pay | Admitting: Radiation Oncology

## 2020-03-04 ENCOUNTER — Other Ambulatory Visit: Payer: Self-pay

## 2020-03-04 ENCOUNTER — Ambulatory Visit
Admission: RE | Admit: 2020-03-04 | Discharge: 2020-03-04 | Disposition: A | Discharge status: Home / Self Care | Payer: Medicare Other | Source: Ambulatory Visit | Attending: Radiation Oncology | Admitting: Radiation Oncology

## 2020-03-04 ENCOUNTER — Ambulatory Visit: Payer: Medicare Other

## 2020-03-04 DIAGNOSIS — D0512 Intraductal carcinoma in situ of left breast: Secondary | ICD-10-CM | POA: Diagnosis not present

## 2020-03-04 DIAGNOSIS — Z510 Encounter for antineoplastic radiation therapy: Secondary | ICD-10-CM | POA: Diagnosis not present

## 2020-03-08 NOTE — Procedures (Signed)
Lumbosacral Transforaminal Epidural Steroid Injection - Sub-Pedicular Approach with Fluoroscopic Guidance  Patient: [name]      Date of Birth: 02/17/1946 MRN: [id number] PCP: Laurey Morale, MD      Visit Date: 02/04/2020   Universal Protocol:    Date/Time: 02/04/2020  Consent Given By: the patient  Position: PRONE  Additional Comments: Vital signs were monitored before and after the procedure. Patient was prepped and draped in the usual sterile fashion. The correct patient, procedure, and site was verified.   Injection Procedure Details:  Procedure Site One Meds Administered:  Meds ordered this encounter  Medications  . dexamethasone (DECADRON) injection 15 mg    Laterality: Bilateral  Location/Site:  L2-L3  Needle size: 6 G  Needle type: Spinal  Needle Placement: Transforaminal  Findings:    -Comments: Excellent flow of contrast along the nerve, nerve root and into the epidural space.  Procedure Details: After squaring off the end-plates to get a true AP view, the C-arm was positioned so that an oblique view of the foramen as noted above was visualized. The target area is just inferior to the "nose of the scotty dog" or sub pedicular. The soft tissues overlying this structure were infiltrated with 2-3 ml. of 1% Lidocaine without Epinephrine.  The spinal needle was inserted toward the target using a "trajectory" view along the fluoroscope beam.  Under AP and lateral visualization, the needle was advanced so it did not puncture dura and was located close the 6 O'Clock position of the pedical in AP tracterory. Biplanar projections were used to confirm position. Aspiration was confirmed to be negative for CSF and/or blood. A 1-2 ml. volume of Isovue-250 was injected and flow of contrast was noted at each level. Radiographs were obtained for documentation purposes.   After attaining the desired flow of contrast documented above, a 0.5 to 1.0 ml test dose of  0.25% Marcaine was injected into each respective transforaminal space.  The patient was observed for 90 seconds post injection.  After no sensory deficits were reported, and normal lower extremity motor function was noted,   the above injectate was administered so that equal amounts of the injectate were placed at each foramen (level) into the transforaminal epidural space.   Additional Comments:  The patient tolerated the procedure well Dressing: 2 x 2 sterile gauze and Band-Aid    Post-procedure details: Patient was observed during the procedure. Post-procedure instructions were reviewed.  Patient left the clinic in stable condition.

## 2020-03-08 NOTE — Progress Notes (Signed)
[name] - 74 y.o. female MRN [id number]  Date of birth: 07/29/1945  Office Visit Note: Visit Date: 02/04/2020 PCP: Laurey Morale, MD Referred by: Laurey Morale, MD  Subjective: Chief Complaint  Patient presents with  . Lower Back - Pain  . Right Hip - Pain  . Left Hip - Pain   HPI:  [name] is a 74 y.o. female who comes in today at the request of Dr. Basil Dess for planned Bilateral L2-L3 Lumbar epidural steroid injection with fluoroscopic guidance.  The patient has failed conservative care including home exercise, medications, time and activity modification.  This injection will be diagnostic and hopefully therapeutic.  Please see requesting physician notes for further details and justification.   ROS Otherwise per HPI.  Assessment & Plan: Visit Diagnoses:  1. Lumbar radiculopathy   2. Post laminectomy syndrome   3. Radiculopathy due to lumbar intervertebral disc disorder     Plan: No additional findings.   Meds & Orders:  Meds ordered this encounter  Medications  . dexamethasone (DECADRON) injection 15 mg    Orders Placed This Encounter  Procedures  . XR C-ARM NO REPORT  . Epidural Steroid injection    Follow-up: Return for visit to requesting physician as needed.   Procedures: No procedures performed  Lumbosacral Transforaminal Epidural Steroid Injection - Sub-Pedicular Approach with Fluoroscopic Guidance  Patient: [name]      Date of Birth: 19-Jun-1946 MRN: [id number] PCP: Laurey Morale, MD      Visit Date: 02/04/2020   Universal Protocol:    Date/Time: 02/04/2020  Consent Given By: the patient  Position: PRONE  Additional Comments: Vital signs were monitored before and after the procedure. Patient was prepped and draped in the usual sterile fashion. The correct patient, procedure, and site was verified.   Injection Procedure Details:  Procedure Site One Meds Administered:  Meds ordered this encounter    Medications  . dexamethasone (DECADRON) injection 15 mg    Laterality: Bilateral  Location/Site:  L2-L3  Needle size: 15 G  Needle type: Spinal  Needle Placement: Transforaminal  Findings:    -Comments: Excellent flow of contrast along the nerve, nerve root and into the epidural space.  Procedure Details: After squaring off the end-plates to get a true AP view, the C-arm was positioned so that an oblique view of the foramen as noted above was visualized. The target area is just inferior to the "nose of the scotty dog" or sub pedicular. The soft tissues overlying this structure were infiltrated with 2-3 ml. of 1% Lidocaine without Epinephrine.  The spinal needle was inserted toward the target using a "trajectory" view along the fluoroscope beam.  Under AP and lateral visualization, the needle was advanced so it did not puncture dura and was located close the 6 O'Clock position of the pedical in AP tracterory. Biplanar projections were used to confirm position. Aspiration was confirmed to be negative for CSF and/or blood. A 1-2 ml. volume of Isovue-250 was injected and flow of contrast was noted at each level. Radiographs were obtained for documentation purposes.   After attaining the desired flow of contrast documented above, a 0.5 to 1.0 ml test dose of 0.25% Marcaine was injected into each respective transforaminal space.  The patient was observed for 90 seconds post injection.  After no sensory deficits were reported, and normal lower extremity motor function was noted,   the above injectate was administered so that equal amounts of the injectate  were placed at each foramen (level) into the transforaminal epidural space.   Additional Comments:  The patient tolerated the procedure well Dressing: 2 x 2 sterile gauze and Band-Aid    Post-procedure details: Patient was observed during the procedure. Post-procedure instructions were reviewed.  Patient left the clinic in stable  condition.      Clinical History: MRI LUMBAR SPINE WITHOUT AND WITH CONTRAST  TECHNIQUE: Multiplanar and multiecho pulse sequences of the lumbar spine were obtained without and with intravenous contrast.  CONTRAST:  65mL MULTIHANCE GADOBENATE DIMEGLUMINE 529 MG/ML IV SOLN  COMPARISON:  Lumbar MRI 02/13/2016  FINDINGS: Segmentation:  Normal  Alignment:  5 mm anterolisthesis L5-S1 unchanged.  Vertebrae:  Negative for fracture or mass.  Conus medullaris and cauda equina: Conus extends to the L2 level. Conus and cauda equina appear normal.  Paraspinal and other soft tissues: Negative for paraspinous mass or fluid collection.  Disc levels:  L1-2: Mild disc degeneration and spurring on the left. No significant stenosis  L2-3: Moderately large extruded disc fragment in the midline is new since the prior study. This indents the thecal sac is causing moderate to severe spinal stenosis left greater than right. Disc protrusion extends into the left foramen and there is significant left subarticular and foraminal stenosis. There is also significant subarticular and foraminal stenosis on the right.  L3-4: Advanced disc degeneration. Interval laminectomy. Mild spinal stenosis. Moderate subarticular and foraminal stenosis bilaterally due to spurring.  L4-5: Pedicle screw and interbody fusion which appears solid. Posterior laminectomy. No significant spinal or foraminal stenosis  L5-S1: Pedicle screw and interbody fusion. 5 mm anterolisthesis. Interbody spacer appears unilateral on the left and appears to project posteriorly into the ventral epidural space, unchanged from CT of 04/03/2017. There is moderate to severe subarticular and foraminal stenosis on the left due to spurring and mild subarticular and foraminal stenosis on the right. No definite arthrodesis at this level.  IMPRESSION: Interval development of moderate to large extruded disc fragment  at L2-3. This is causing moderate to severe spinal stenosis. Disc protrusion extends into the foramen on the left. There is significant foraminal and subarticular stenosis bilaterally left greater than right  Laminectomy L3-4 with mild spinal stenosis and moderate subarticular stenosis bilaterally  Solid fusion L4-5  PLIF L5-S1 without arthrodesis identified. Interbody spacer on the left projects into the ventral epidural space unchanged from prior studies. Moderate to severe subarticular stenosis on the left.   Electronically Signed   By: Franchot Gallo M.D.   On: 05/04/2019 14:04     Objective:  VS:  HT:    WT:   BMI:     BP:(!) 172/72  HR:67bpm  TEMP: ( )  RESP:  Physical Exam Constitutional:      General: She is not in acute distress.    Appearance: Normal appearance. She is not ill-appearing.  HENT:     Head: Normocephalic and atraumatic.     Right Ear: External ear normal.     Left Ear: External ear normal.  Eyes:     Extraocular Movements: Extraocular movements intact.  Cardiovascular:     Rate and Rhythm: Normal rate.     Pulses: Normal pulses.  Musculoskeletal:     Right lower leg: No edema.     Left lower leg: No edema.     Comments: Patient has good distal strength with no pain over the greater trochanters.  No clonus or focal weakness.  Skin:    Findings: No erythema, lesion or rash.  Neurological:     General: No focal deficit present.     Mental Status: She is alert and oriented to person, place, and time.     Sensory: No sensory deficit.     Motor: No weakness or abnormal muscle tone.     Coordination: Coordination normal.  Psychiatric:        Mood and Affect: Mood normal.        Behavior: Behavior normal.      Imaging: No results found.

## 2020-03-09 ENCOUNTER — Encounter: Payer: Self-pay | Admitting: Family Medicine

## 2020-03-09 ENCOUNTER — Ambulatory Visit (INDEPENDENT_AMBULATORY_CARE_PROVIDER_SITE_OTHER): Payer: Medicare Other | Admitting: Family Medicine

## 2020-03-09 ENCOUNTER — Other Ambulatory Visit: Payer: Self-pay

## 2020-03-09 VITALS — BP 120/50 | HR 75 | Temp 99.2°F | Ht 63.0 in | Wt 176.6 lb

## 2020-03-09 DIAGNOSIS — J4520 Mild intermittent asthma, uncomplicated: Secondary | ICD-10-CM | POA: Diagnosis not present

## 2020-03-09 DIAGNOSIS — E118 Type 2 diabetes mellitus with unspecified complications: Secondary | ICD-10-CM | POA: Diagnosis not present

## 2020-03-09 DIAGNOSIS — M48062 Spinal stenosis, lumbar region with neurogenic claudication: Secondary | ICD-10-CM

## 2020-03-09 DIAGNOSIS — I10 Essential (primary) hypertension: Secondary | ICD-10-CM

## 2020-03-09 DIAGNOSIS — I2510 Atherosclerotic heart disease of native coronary artery without angina pectoris: Secondary | ICD-10-CM

## 2020-03-09 DIAGNOSIS — Z23 Encounter for immunization: Secondary | ICD-10-CM

## 2020-03-09 DIAGNOSIS — N6092 Unspecified benign mammary dysplasia of left breast: Secondary | ICD-10-CM

## 2020-03-09 DIAGNOSIS — I5032 Chronic diastolic (congestive) heart failure: Secondary | ICD-10-CM | POA: Diagnosis not present

## 2020-03-09 DIAGNOSIS — K219 Gastro-esophageal reflux disease without esophagitis: Secondary | ICD-10-CM | POA: Diagnosis not present

## 2020-03-09 DIAGNOSIS — E785 Hyperlipidemia, unspecified: Secondary | ICD-10-CM | POA: Diagnosis not present

## 2020-03-09 NOTE — Progress Notes (Signed)
Subjective:    Patient ID: [name], female    DOB: 1945-09-30, 74 y.o.   MRN: [id number]  HPI Here to follow up on issues. She feels fairly well except for her chronic low back pain. She just had her final radiation treatment for her breast cancer per Dr. Eppie [name]. Her oncologist is Dr. Truitt [name]. She sees Dr. Forde [name] for diabetes, and this has been very well controlled. Her A1c 2 weeks ago was 6.3.    Review of Systems  Constitutional: Negative.   HENT: Negative.   Eyes: Negative.   Respiratory: Negative.   Cardiovascular: Negative.   Gastrointestinal: Negative.   Genitourinary: Negative for decreased urine volume, difficulty urinating, dyspareunia, dysuria, enuresis, flank pain, frequency, hematuria, pelvic pain and urgency.  Musculoskeletal: Positive for back pain.  Skin: Negative.   Neurological: Negative.   Psychiatric/Behavioral: Negative.        Objective:   Physical Exam Constitutional:      General: She is not in acute distress.    Appearance: She is well-developed.     Comments: In a wheelchair   HENT:     Head: Normocephalic and atraumatic.     Right Ear: External ear normal.     Left Ear: External ear normal.     Nose: Nose normal.     Mouth/Throat:     Pharynx: No oropharyngeal exudate.  Eyes:     General: No scleral icterus.    Conjunctiva/sclera: Conjunctivae normal.     Pupils: Pupils are equal, round, and reactive to light.  Neck:     Thyroid: No thyromegaly.     Vascular: No JVD.  Cardiovascular:     Rate and Rhythm: Normal rate and regular rhythm.     Heart sounds: Normal heart sounds. No murmur heard.  No friction rub. No gallop.   Pulmonary:     Effort: Pulmonary effort is normal. No respiratory distress.     Breath sounds: Normal breath sounds. No wheezing or rales.  Chest:     Chest wall: No tenderness.  Abdominal:     General: Bowel sounds are normal. There is no distension.     Palpations: Abdomen is soft. There is no  mass.     Tenderness: There is no abdominal tenderness. There is no guarding or rebound.  Musculoskeletal:        General: No tenderness. Normal range of motion.     Cervical back: Normal range of motion and neck supple.  Lymphadenopathy:     Cervical: No cervical adenopathy.  Skin:    General: Skin is warm and dry.     Findings: No erythema or rash.  Neurological:     Mental Status: She is alert and oriented to person, place, and time.     Cranial Nerves: No cranial nerve deficit.     Motor: No abnormal muscle tone.     Coordination: Coordination normal.     Deep Tendon Reflexes: Reflexes are normal and symmetric. Reflexes normal.  Psychiatric:        Behavior: Behavior normal.        Thought Content: Thought content normal.        Judgment: Judgment normal.           Assessment & Plan:  She is doing well as far as the breast cancer, diabetes, HTN and CHF go. We will get fasting labs today to check lipids, etc. Given a pneumococcal 23 vaccine. She wil get a flu vaccine soon. Karen Main  Sarajane Jews, MD

## 2020-03-10 LAB — HEPATIC FUNCTION PANEL
AG Ratio: 1.8 (calc) (ref 1.0–2.5)
ALT: 13 U/L (ref 6–29)
AST: 16 U/L (ref 10–35)
Albumin: 4.2 g/dL (ref 3.6–5.1)
Alkaline phosphatase (APISO): 61 U/L (ref 37–153)
Bilirubin, Direct: 0.1 mg/dL (ref 0.0–0.2)
Globulin: 2.3 g/dL (calc) (ref 1.9–3.7)
Indirect Bilirubin: 0.4 mg/dL (calc) (ref 0.2–1.2)
Total Bilirubin: 0.5 mg/dL (ref 0.2–1.2)
Total Protein: 6.5 g/dL (ref 6.1–8.1)

## 2020-03-10 LAB — CBC WITH DIFFERENTIAL/PLATELET
Absolute Monocytes: 681 cells/uL (ref 200–950)
Basophils Absolute: 28 cells/uL (ref 0–200)
Basophils Relative: 0.3 %
Eosinophils Absolute: 64 cells/uL (ref 15–500)
Eosinophils Relative: 0.7 %
HCT: 40 % (ref 35.0–45.0)
Hemoglobin: 13.1 g/dL (ref 11.7–15.5)
Lymphs Abs: 1159 cells/uL (ref 850–3900)
MCH: 28.4 pg (ref 27.0–33.0)
MCHC: 32.8 g/dL (ref 32.0–36.0)
MCV: 86.8 fL (ref 80.0–100.0)
MPV: 13.4 fL — ABNORMAL HIGH (ref 7.5–12.5)
Monocytes Relative: 7.4 %
Neutro Abs: 7268 cells/uL (ref 1500–7800)
Neutrophils Relative %: 79 %
Platelets: 212 10*3/uL (ref 140–400)
RBC: 4.61 10*6/uL (ref 3.80–5.10)
RDW: 14.5 % (ref 11.0–15.0)
Total Lymphocyte: 12.6 %
WBC: 9.2 10*3/uL (ref 3.8–10.8)

## 2020-03-10 LAB — LIPID PANEL
Cholesterol: 103 mg/dL (ref <200)
HDL: 38 mg/dL — ABNORMAL LOW (ref >=50)
LDL Cholesterol (Calc): 50 mg/dL (calc)
Non-HDL Cholesterol (Calc): 65 mg/dL (calc) (ref <130)
Total CHOL/HDL Ratio: 2.7 (calc) (ref <5.0)
Triglycerides: 71 mg/dL (ref <150)

## 2020-03-10 LAB — BASIC METABOLIC PANEL
BUN: 17 mg/dL (ref 7–25)
CO2: 29 mmol/L (ref 20–32)
Calcium: 9.8 mg/dL (ref 8.6–10.4)
Chloride: 105 mmol/L (ref 98–110)
Creat: 0.92 mg/dL (ref 0.60–0.93)
Glucose, Bld: 84 mg/dL (ref 65–99)
Potassium: 3.7 mmol/L (ref 3.5–5.3)
Sodium: 142 mmol/L (ref 135–146)

## 2020-03-10 LAB — TSH: TSH: 2.6 mIU/L (ref 0.40–4.50)

## 2020-03-15 ENCOUNTER — Telehealth: Payer: Self-pay | Admitting: Family Medicine

## 2020-03-15 DIAGNOSIS — I779 Disorder of arteries and arterioles, unspecified: Secondary | ICD-10-CM

## 2020-03-15 NOTE — Telephone Encounter (Signed)
Pt wants to continue monitoring her carotic artery with Dr. Carlynn Spry and she needs Dr. Sarajane Jews to send over an order for it  Pt did not know the name of the practice or the phone number. She did say it was a vein specialist.  Please advise

## 2020-03-16 ENCOUNTER — Other Ambulatory Visit: Payer: Self-pay | Admitting: Otolaryngology

## 2020-03-16 DIAGNOSIS — H903 Sensorineural hearing loss, bilateral: Secondary | ICD-10-CM | POA: Diagnosis not present

## 2020-03-16 DIAGNOSIS — H6121 Impacted cerumen, right ear: Secondary | ICD-10-CM | POA: Diagnosis not present

## 2020-03-16 DIAGNOSIS — H918X9 Other specified hearing loss, unspecified ear: Secondary | ICD-10-CM

## 2020-03-16 DIAGNOSIS — H9122 Sudden idiopathic hearing loss, left ear: Secondary | ICD-10-CM | POA: Diagnosis not present

## 2020-03-16 DIAGNOSIS — H838X3 Other specified diseases of inner ear, bilateral: Secondary | ICD-10-CM | POA: Diagnosis not present

## 2020-03-17 NOTE — Addendum Note (Signed)
Addended by: Alysia Penna A on: 03/17/2020 07:50 AM   Modules accepted: Orders

## 2020-03-17 NOTE — Telephone Encounter (Signed)
I ordered another carotid doppler study

## 2020-03-21 NOTE — Progress Notes (Signed)
[place]   [phone] Fax:(336) [phone]   Clinic Follow up Note   Patient Care Team: Laurey Morale, MD as PCP - Cyndia Diver, MD as PCP - Cardiology (Cardiology) Reynold Bowen, MD as Consulting Physician (Endocrinology) Ashok Pall, MD as Consulting Physician (Neurosurgery) Earlie Server, MD as Consulting Physician (Orthopedic Surgery) Eli Hose, [facility] (Inactive) as Pharmacist (Pharmacist) Mauro Kaufmann, RN as Oncology Nurse Navigator Rockwell Germany, RN as Oncology Nurse Navigator  Date of Service:  03/23/2020  CHIEF COMPLAINT: Follow up left breast DCIS  Oncology History Overview Note  Cancer Staging No matching staging information was found for the patient.    Atypical lobular hyperplasia of left breast  01/26/2014 Surgery   Left Lumpectomy   Diagnosis Breast, excision, Palpable left - LOBULAR NEOPLASIA (ATYPICAL LOBULAR HYPERPLASIA) SEE COMMENT - MICROCALCIFICATIONS IDENTIFIED. - SURGICAL MARGIN, NEGATIVE FOR ATYPIA OR MALIGNANCY. Microscopic Comment The surgical resection margin(s) of the specimen were inked and microscopically evaluated. Multiple representative sections demonstrate foci of atypical lobular hyperplasia. Additional non-neoplastic findings to include benign radial scar, fibrocystic change, usual ductal hyperplasia, columnar cell hyperplasia/change without atypia, benign adenosis, patchy mild periductal chronic inflammation, and solitary hyalinizing calcified fibroadenomatoid nodule. There are abundant microcalcifications present within benign lobules. (CRR:ecj 01/27/2014)   02/12/2014 Initial Diagnosis   Atypical lobular hyperplasia of left breast   11/2014 - 04/2015 Anti-estrogen oral therapy   She tried anastrozole in 11/2014 and Tamoxifen in 03/2015 for about one month each, and stopped due to side effects.    06/27/2017 Pathology Results   Diagnosis 1. Breast, left, needle core biopsy, posterior  upper outer quadrant - FIBROCYSTIC CHANGES. - THERE IS NO EVIDENCE OF MALIGNANCY. - SEE COMMENT. 2. Breast, left, needle core biopsy, anterior lower inner quadrant - FIBROCYSTIC CHANGES. - THERE IS NO EVIDENCE OF MALIGNANCY. - SEE COMMENT. Microscopic Comment 1. , 2. The results were called to [facility] on 06/28/2017. (JBK:ecj 06/29/1027)   07/24/2017 Surgery   Left Lumpectomy  Diagnosis Breast, lumpectomy, Left - FIBROCYSTIC CHANGES. - HEALING BIOPSY SITE. - THERE IS NO EVIDENCE OF MALIGNANCY. - SEE COMMENT. Microscopic Comment The surgical resection margin(s) of the specimen were inked and microscopically evaluated.   Ductal carcinoma in situ (DCIS) of left breast  12/16/2019 Initial Biopsy   Diagnosis 1. Breast, right, needle core biopsy, upper inner - FIBROCYSTIC CHANGES. - NO MALIGNANCY IDENTIFIED. 2. Breast, right, needle core biopsy, lateral - FIBROCYSTIC CHANGES WITH FOCAL ADENOSIS. - NO MALIGNANCY IDENTIFIED. 3. Breast, left, needle core biopsy, lateral - DUCTAL CARCINOMA IN SITU, INTERMEDIATE GRADE. - SEE MICROSCOPIC DESCRIPTION Microscopic Comment 3. Immunohistochemistry for p63, calponin and smooth muscle myosin and E-Cadherin supports the diagnosis. Estrogen and progesterone receptors will be performed. Dr. Vic Ripper has reviewed part 3 and agrees.   12/16/2019 Receptors her2   3. PROGNOSTIC INDICATORS Results: IMMUNOHISTOCHEMICAL AND MORPHOMETRIC ANALYSIS PERFORMED MANUALLY Estrogen Receptor: 95%, POSITIVE, STRONG-MODERATE STAINING INTENSITY Progesterone Receptor: >95%, POSITIVE, STRONG STAINING INTENSITY   01/06/2020 Initial Diagnosis   Ductal carcinoma in situ (DCIS) of left breast   01/13/2020 Surgery   LEFT BREAST LUMPECTOMY WITH RADIOACTIVE SEED LOCALIZATION by Dr Brantley Stage   01/13/2020 Pathology Results   FINAL MICROSCOPIC DIAGNOSIS:   A. BREAST, LEFT, LUMPECTOMY:  - Intermediate grade ductal carcinoma in situ with necrosis,  spanning 2  cm.  - Resection margins are negative.  - Biopsy site and cavity.  - See oncology table.    02/11/2020 - 03/04/2020 Radiation Therapy  Adjuvant Radiation with Dr Isidore Moos     Anti-estrogen oral therapy   She declined as she did not tolerate before      CURRENT THERAPY:  Surveillance   INTERVAL HISTORY:  [name] is here for a follow up of [name] and DCIS. She was last seen by me in 11 months ago and seen by NP [name] 3 months ago in interim. She presents to the clinic alone. She notes she has tolerated DCIS treatment well. She does not want to proceed with antiestrogen therapy. She is fine to continue Mammograms and MRIs.     REVIEW OF SYSTEMS:   Constitutional: Denies fevers, chills or abnormal weight loss Eyes: Denies blurriness of vision Ears, nose, mouth, throat, and face: Denies mucositis or sore throat Respiratory: Denies cough, dyspnea or wheezes Cardiovascular: Denies palpitation, chest discomfort or lower extremity swelling Gastrointestinal:  Denies nausea, heartburn or change in bowel habits Skin: Denies abnormal skin rashes Lymphatics: Denies new lymphadenopathy or easy bruising Neurological:Denies numbness, tingling or new weaknesses Behavioral/Psych: Mood is stable, no new changes  All other systems were reviewed with the patient and are negative.  MEDICAL HISTORY:  Past Medical History:  Diagnosis Date  . Abnormal nuclear stress test 12/29/2014  . Allergy   . Anemia, unspecified   . Arthritis   . Blood transfusion without reported diagnosis    2018  . CAD (coronary artery disease)    sees. Dr. Burt Knack. s/p cath with PCI of RCA (xience des) June 2010. Pt also with LAD and D2 dzs-NL FFR in both areas med Rx  . Carotid artery occlusion   . Cataract    sees Dr. Herbert Deaner   . Depressive disorder, not elsewhere classified   . Duodenal nodule   . Edema   . Fibromyalgia   . Gastric polyps    COLON  . GERD (gastroesophageal reflux disease)     . Glaucoma    narrow angle, sees Dr. Herbert Deaner   . Headache(784.0)   . Hearing loss    left ear  . Heart murmur    questionable  . Hemorrhoids   . HSV (herpes simplex virus) anogenital infection 05/2014  . HTN (hypertension)   . Hyperlipidemia   . Low back pain    she sees Dr. Louanne Skye, also Dr. Ernestina Patches for pain medications and Benjiman Core PA for injections   . Obesity   . Osteopenia 01/2018   T score -1.6 FRAX 7.1% / 1.4%  . Peripheral vascular disease ([place])   . Stroke [facility])    TIA  "many yrs ago"    . TIA (transient ischemic attack)    also Hx of it.   . Type II or unspecified type diabetes mellitus without mention of complication, not stated as uncontrolled    sees Dr. Carrolyn Meiers     SURGICAL HISTORY: Past Surgical History:  Procedure Laterality Date  . ABDOMINAL HYSTERECTOMY  age 37   TAH and USO  Leiomyomata  . ANTERIOR CERVICAL CORPECTOMY N/A 06/11/2014   Procedure: ANTERIOR CERVICAL CORPECTOMY CERVICAL SIX; WITH CERVICAL FIVE TO CERVICAL SEVEN ARTHRODESIS;  Surgeon: Ashok Pall, MD;  Location: [place] NEURO ORS;  Service: Neurosurgery;  Laterality: N/A;  . BACK SURGERY  2008   lumb fusion  . BREAST BIOPSY Left 01/26/2014   Procedure: EXCISION LEFT BREAST MASS;  Surgeon: Adin Hector, MD;  Location: [place];  Service: General;  Laterality: Left;  . BREAST LUMPECTOMY WITH RADIOACTIVE SEED LOCALIZATION Left 07/24/2017   Procedure:  LEFT BREAST LUMPECTOMY WITH RADIOACTIVE SEED LOCALIZATION ERAS;  Surgeon: Erroll Luna, MD;  Location: [place];  Service: General;  Laterality: Left;  . BREAST LUMPECTOMY WITH RADIOACTIVE SEED LOCALIZATION Left 01/13/2020   Procedure: LEFT BREAST LUMPECTOMY WITH RADIOACTIVE SEED LOCALIZATION;  Surgeon: Erroll Luna, MD;  Location: [place];  Service: General;  Laterality: Left;  . CARDIAC CATHETERIZATION    . CARDIAC CATHETERIZATION N/A 12/29/2014   Procedure: Left Heart Cath and Coronary  Angiography;  Surgeon: Sherren Mocha, MD;  Location: [place] CV LAB;  Service: Cardiovascular;  Laterality: N/A;  . CAROTID ENDARTERECTOMY  march 2012   per Dr. Morton Amy  . CARPAL TUNNEL RELEASE Left 05/14/2014   Procedure: Left Carpal tunnel release;  Surgeon: Ashok Pall, MD;  Location: [place] NEURO ORS;  Service: Neurosurgery;  Laterality: Left;  Left Carpal tunnel release  . CARPAL TUNNEL RELEASE Bilateral   . CHOLECYSTECTOMY    . COLONOSCOPY  06/26/2017   per Dr. Henrene Pastor, adenomatous polyp, repeat in 5 yrs    . CORONARY STENT PLACEMENT     Dr. Burt Knack  . DILATION AND CURETTAGE OF UTERUS    . ESOPHAGOGASTRODUODENOSCOPY  02-08-06   per Dr. Henrene Pastor, gastritis   . EYE SURGERY Left June 2016   Cataract  . GLAUCOMA SURGERY     with laser, per Dr. Henrene Pastor   . HARDWARE REMOVAL  10/30/2016   Procedure: HARDWARE REMOVAL;  Surgeon: Jessy Oto, MD;  Location: [place];  Service: Orthopedics;;  . LUMBAR FUSION  2008  . POLYPECTOMY    . POSTERIOR LUMBAR FUSION  10/30/2016   Removal of hardware rods and pedicle screws lumbar four, Extension of fusion L4-5 to L5-S1 with reinsertion of screws at L 4, left L5-S1 transforaminal lumbar interbody fusion, Exploration  left L5 nerve root, bilateral decompressive laminectomy L3-4/notes 10/30/2016  . ULNAR NERVE TRANSPOSITION Left 05/14/2014   Procedure: Left Ulnar nerve decompression;  Surgeon: Ashok Pall, MD;  Location: [place] NEURO ORS;  Service: Neurosurgery;  Laterality: Left;  Left Ulnar nerve decompression    I have reviewed the social history and family history with the patient and they are unchanged from previous note.  ALLERGIES:  has No Known Allergies.  MEDICATIONS:  Current Outpatient Medications  Medication Sig Dispense Refill  . ACCU-CHEK GUIDE test strip 1 each by Other route in the morning and at bedtime.    . diclofenac sodium (VOLTAREN) 1 % GEL Apply 2 g topically 4 (four) times daily as needed (for pain).     Marland Kitchen ergocalciferol  (VITAMIN D2) 50000 UNITS capsule Take 50,000 Units by mouth every Saturday. On Saturday.    Marland Kitchen FARXIGA 10 MG TABS tablet Take 10 mg by mouth every morning.    . furosemide (LASIX) 40 MG tablet Take 1 tablet (40 mg total) by mouth daily. 90 tablet 3  . HUMALOG 100 UNIT/ML injection     . hydrALAZINE (APRESOLINE) 100 MG tablet TAKE 1 TABLET BY MOUTH THREE TIMES DAILY 270 tablet 2  . Insulin Human (INSULIN PUMP) SOLN Inject into the skin. Uses Novolog insulin for pump    . irbesartan (AVAPRO) 300 MG tablet TAKE 1 TABLET BY MOUTH ONCE DAILY . APPOINTMENT REQUIRED FOR FUTURE REFILLS 90 tablet 3  . isosorbide mononitrate (IMDUR) 60 MG 24 hr tablet Take 1.5 tablets (90 mg total) by mouth daily. 135 tablet 2  . latanoprost (XALATAN) 0.005 % ophthalmic solution Place 1 drop into both eyes at bedtime.     Marland Kitchen  metoprolol succinate (TOPROL-XL) 100 MG 24 hr tablet TAKE 1 TABLET BY MOUTH ONCE DAILY OR  IMMEDIATELY  FOLLOWING  A  MEAL 90 tablet 3  . montelukast (SINGULAIR) 10 MG tablet Take 10 mg by mouth daily.    . nitroGLYCERIN (NITROSTAT) 0.4 MG SL tablet Place 1 tablet (0.4 mg total) under the tongue every 5 (five) minutes as needed for chest pain. 25 tablet 1  . nystatin-triamcinolone (MYCOLOG II) cream Apply 1 application topically 2 (two) times daily. 30 g 2  . OZEMPIC, 1 MG/DOSE, 4 MG/3ML SOPN Inject 1 mg into the skin once a week.    . pantoprazole (PROTONIX) 40 MG tablet Take 1 tablet by mouth once daily 90 tablet 0  . rosuvastatin (CRESTOR) 40 MG tablet Take 1 tablet by mouth once daily 90 tablet 3  . spironolactone (ALDACTONE) 50 MG tablet Take 1 tablet (50 mg total) by mouth daily. 90 tablet 3   No current facility-administered medications for this visit.    PHYSICAL EXAMINATION: ECOG PERFORMANCE STATUS: 2 - Symptomatic, <50% confined to bed  Vitals:   03/23/20 1042  BP: (!) 139/55  Pulse: 73  Resp: 20  Temp: 98.2 F (36.8 C)  SpO2: 100%   Filed Weights   03/23/20 1042  Weight: 175 lb  12.8 oz (79.7 kg)    GENERAL:alert, no distress and comfortable SKIN: skin color, texture, turgor are normal, no rashes or significant lesions EYES: normal, Conjunctiva are pink and non-injected, sclera clear  NECK: supple, thyroid normal size, non-tender, without nodularity LYMPH:  no palpable lymphadenopathy in the cervical, axillary  LUNGS: clear to auscultation and percussion with normal breathing effort HEART: regular rate & rhythm and no murmurs and no lower extremity edema ABDOMEN:abdomen soft, non-tender and normal bowel sounds Musculoskeletal:no cyanosis of digits and no clubbing  NEURO: alert & oriented x 3 with fluent speech, no focal motor/sensory deficits BREAST: s/p left lumpectomy: Surgical incision healed well with scar tissue. (+) Left breast tenderness. Right Breast exam benign.   LABORATORY DATA:  I have reviewed the data as listed CBC Latest Ref Rng & Units 03/23/2020 03/09/2020 01/12/2020  WBC 4.0 - 10.5 K/uL 8.0 9.2 10.3  Hemoglobin 12.0 - 15.0 g/dL 13.1 13.1 12.4  Hematocrit 36 - 46 % 41.5 40.0 40.2  Platelets 150 - 400 K/uL 177 212 182     CMP Latest Ref Rng & Units 03/23/2020 03/09/2020 01/12/2020  Glucose 70 - 99 mg/dL 87 84 121(H)  BUN 8 - 23 mg/dL 16 17 16   Creatinine 0.44 - 1.00 mg/dL 1.08(H) 0.92 1.21(H)  Sodium 135 - 145 mmol/L 142 142 143  Potassium 3.5 - 5.1 mmol/L 4.3 3.7 4.1  Chloride 98 - 111 mmol/L 108 105 106  CO2 22 - 32 mmol/L 29 29 27   Calcium 8.9 - 10.3 mg/dL 9.8 9.8 9.5  Total Protein 6.5 - 8.1 g/dL 6.9 6.5 7.1  Total Bilirubin 0.3 - 1.2 mg/dL 0.6 0.5 0.6  Alkaline Phos 38 - 126 U/L 69 - 64  AST 15 - 41 U/L 22 16 22   ALT 0 - 44 U/L 21 13 19       RADIOGRAPHIC STUDIES: I have personally reviewed the radiological images as listed and agreed with the findings in the report. No results found.   ASSESSMENT & PLAN:  [name] is a 74 y.o. female with    1. Left Breast DCIS, Intermediate Grade, ER+/PR+ -She was discovered on  screening MRI, diagnosed in 12/2019. She underwent Left lumpectomy  with Dr Brantley Stage on 01/13/20 and recently completed Adjuvant Radiation with Dr Isidore Moos 02/11/20-03/04/20.  -I discussed given her h/o of left [facility] and recent left DCIS, she is at higher risk of developing future breast cancer.  -I discussed given her ER/PR positive disease she would benefit from antiestrogen therapy to reduce her risk of future breast cancer. She understands her risk, she previously tried anastrozole and tamoxifen, given she tolerated poorly before, she declined trying again.  -I reviewed breast cancer surveillance. She will continue annual screening mammogram, self exams, and a routine office visit with lab and exam with [place]. She will also continue breast MRI's for additional screening.  -Next Mammogram in 08/2020 and MRI breast in 02/2021.  -F/u in 6 months with NP [name]     2. H/o Left breast ALH, Genetics Negative  -She is s/p left breast lumpectomy as of 01/26/14, which showed [place].  She tried chemo prevention with anastrozole and tamoxifen but could not tolerate due to side effects. She declined for treatment with Raloxifene  -She has strong family history of breast cancer, but her genetic testing was negative.   3. HTN, DM, CAD, history of TIA -She will continue follow-up with her primary care physician and continues medications.   4. Chronic Lower back pain, Right hip pain  -She ambulates with cane.  -She with continue to manage with orthopedic surgeon.    Plan -f/u with NP [name] in 6 months  -Mammogram at [facility] in 08/2020 -continue breast cancer surveillance    No problem-specific Assessment & Plan notes found for this encounter.   Orders Placed This Encounter  Procedures  . MM DIAG BREAST TOMO BILATERAL    Standing Status:   Future    Standing Expiration Date:   03/23/2021    Scheduling Instructions:     [place]    Order Specific Question:   Reason for Exam (SYMPTOM  OR DIAGNOSIS REQUIRED)    Answer:    screening    Order Specific Question:   Preferred imaging location?    Answer:   External   All questions were answered. The patient knows to call the clinic with any problems, questions or concerns. No barriers to learning was detected. The total time spent in the appointment was 30 minutes.     Truitt Merle, MD 03/23/2020   I, Joslyn Devon, am acting as scribe for Truitt Merle, MD.   I have reviewed the above documentation for accuracy and completeness, and I agree with the above.

## 2020-03-23 ENCOUNTER — Other Ambulatory Visit: Payer: Self-pay

## 2020-03-23 ENCOUNTER — Telehealth: Payer: Self-pay | Admitting: Hematology

## 2020-03-23 ENCOUNTER — Inpatient Hospital Stay: Payer: Medicare Other | Attending: Hematology | Admitting: Hematology

## 2020-03-23 ENCOUNTER — Inpatient Hospital Stay: Payer: Medicare Other

## 2020-03-23 ENCOUNTER — Encounter: Payer: Self-pay | Admitting: Hematology

## 2020-03-23 VITALS — BP 139/55 | HR 73 | Temp 98.2°F | Resp 20 | Ht 63.0 in | Wt 175.8 lb

## 2020-03-23 DIAGNOSIS — D500 Iron deficiency anemia secondary to blood loss (chronic): Secondary | ICD-10-CM

## 2020-03-23 DIAGNOSIS — Z170 Estrogen receptor positive status [ER+]: Secondary | ICD-10-CM | POA: Insufficient documentation

## 2020-03-23 DIAGNOSIS — G8929 Other chronic pain: Secondary | ICD-10-CM | POA: Insufficient documentation

## 2020-03-23 DIAGNOSIS — Z923 Personal history of irradiation: Secondary | ICD-10-CM | POA: Diagnosis not present

## 2020-03-23 DIAGNOSIS — E785 Hyperlipidemia, unspecified: Secondary | ICD-10-CM | POA: Insufficient documentation

## 2020-03-23 DIAGNOSIS — K219 Gastro-esophageal reflux disease without esophagitis: Secondary | ICD-10-CM | POA: Insufficient documentation

## 2020-03-23 DIAGNOSIS — N6092 Unspecified benign mammary dysplasia of left breast: Secondary | ICD-10-CM

## 2020-03-23 DIAGNOSIS — Z8673 Personal history of transient ischemic attack (TIA), and cerebral infarction without residual deficits: Secondary | ICD-10-CM | POA: Diagnosis not present

## 2020-03-23 DIAGNOSIS — M8580 Other specified disorders of bone density and structure, unspecified site: Secondary | ICD-10-CM | POA: Insufficient documentation

## 2020-03-23 DIAGNOSIS — M545 Low back pain: Secondary | ICD-10-CM | POA: Diagnosis not present

## 2020-03-23 DIAGNOSIS — I2510 Atherosclerotic heart disease of native coronary artery without angina pectoris: Secondary | ICD-10-CM | POA: Insufficient documentation

## 2020-03-23 DIAGNOSIS — D0512 Intraductal carcinoma in situ of left breast: Secondary | ICD-10-CM | POA: Diagnosis not present

## 2020-03-23 DIAGNOSIS — E119 Type 2 diabetes mellitus without complications: Secondary | ICD-10-CM | POA: Diagnosis not present

## 2020-03-23 DIAGNOSIS — I10 Essential (primary) hypertension: Secondary | ICD-10-CM | POA: Diagnosis not present

## 2020-03-23 LAB — COMPREHENSIVE METABOLIC PANEL
ALT: 21 U/L (ref 0–44)
AST: 22 U/L (ref 15–41)
Albumin: 3.7 g/dL (ref 3.5–5.0)
Alkaline Phosphatase: 69 U/L (ref 38–126)
Anion gap: 5 (ref 5–15)
BUN: 16 mg/dL (ref 8–23)
CO2: 29 mmol/L (ref 22–32)
Calcium: 9.8 mg/dL (ref 8.9–10.3)
Chloride: 108 mmol/L (ref 98–111)
Creatinine, Ser: 1.08 mg/dL — ABNORMAL HIGH (ref 0.44–1.00)
GFR calc Af Amer: 59 mL/min — ABNORMAL LOW (ref >60)
GFR calc non Af Amer: 51 mL/min — ABNORMAL LOW (ref >60)
Glucose, Bld: 87 mg/dL (ref 70–99)
Potassium: 4.3 mmol/L (ref 3.5–5.1)
Sodium: 142 mmol/L (ref 135–145)
Total Bilirubin: 0.6 mg/dL (ref 0.3–1.2)
Total Protein: 6.9 g/dL (ref 6.5–8.1)

## 2020-03-23 LAB — CBC WITH DIFFERENTIAL/PLATELET
Abs Immature Granulocytes: 0.01 10*3/uL (ref 0.00–0.07)
Basophils Absolute: 0 10*3/uL (ref 0.0–0.1)
Basophils Relative: 1 %
Eosinophils Absolute: 0.1 10*3/uL (ref 0.0–0.5)
Eosinophils Relative: 2 %
HCT: 41.5 % (ref 36.0–46.0)
Hemoglobin: 13.1 g/dL (ref 12.0–15.0)
Immature Granulocytes: 0 %
Lymphocytes Relative: 15 %
Lymphs Abs: 1.2 10*3/uL (ref 0.7–4.0)
MCH: 27.9 pg (ref 26.0–34.0)
MCHC: 31.6 g/dL (ref 30.0–36.0)
MCV: 88.5 fL (ref 80.0–100.0)
Monocytes Absolute: 0.5 10*3/uL (ref 0.1–1.0)
Monocytes Relative: 7 %
Neutro Abs: 6.1 10*3/uL (ref 1.7–7.7)
Neutrophils Relative %: 75 %
Platelets: 177 10*3/uL (ref 150–400)
RBC: 4.69 MIL/uL (ref 3.87–5.11)
RDW: 14.9 % (ref 11.5–15.5)
WBC: 8 10*3/uL (ref 4.0–10.5)
nRBC: 0 % (ref 0.0–0.2)

## 2020-03-23 LAB — RETICULOCYTES
Immature Retic Fract: 7.3 % (ref 2.3–15.9)
RBC.: 4.63 MIL/uL (ref 3.87–5.11)
Retic Count, Absolute: 56.5 10*3/uL (ref 19.0–186.0)
Retic Ct Pct: 1.2 % (ref 0.4–3.1)

## 2020-03-23 NOTE — Telephone Encounter (Signed)
Scheduled per 9/15 los. Printed avs and calendar for pt.

## 2020-03-29 ENCOUNTER — Encounter: Payer: Self-pay | Admitting: Surgery

## 2020-03-30 ENCOUNTER — Other Ambulatory Visit: Payer: Self-pay

## 2020-03-30 ENCOUNTER — Ambulatory Visit (HOSPITAL_COMMUNITY)
Admission: RE | Admit: 2020-03-30 | Discharge: 2020-03-30 | Disposition: A | Discharge status: Home / Self Care | Payer: Medicare Other | Source: Ambulatory Visit | Attending: Family Medicine | Admitting: Family Medicine

## 2020-03-30 DIAGNOSIS — I779 Disorder of arteries and arterioles, unspecified: Secondary | ICD-10-CM | POA: Diagnosis not present

## 2020-03-31 NOTE — Progress Notes (Signed)
[name] presents today for follow-up after completing radiation to her left breast on 03/04/2020  Skin: Overall looks well healed, with small area of hyperpigmentation around areola. There are some small patches of peeling under breast where skin is staying warm/moist. Pain: Reports a throbbing pain in the left upper quadrant of her left breast. On palpation patient does have a firm area of tissue in location of discomfort. Patient also has ongoing right hip/back pain Swelling: None noticed on assessment ROM: Patient denies any issues/concerns Med-Onc F/U: Last saw Dr. Truitt Merle on 03/23/2020:  "-f/u with NP [name] in 6 months  -Mammogram at [facility] in 08/2020 -continue breast cancer surveillance"  Other concerns: Nothing of note.  Vitals:   04/01/20 1125  BP: (!) 116/53  Pulse: 72  Resp: 18  Temp: 98.5 F (36.9 C)  SpO2: 100%   Wt Readings from Last 3 Encounters:  04/01/20 175 lb 4 oz (79.5 kg)  03/23/20 175 lb 12.8 oz (79.7 kg)  03/09/20 176 lb 9.6 oz (80.1 kg)

## 2020-04-01 ENCOUNTER — Inpatient Hospital Stay: Payer: Medicare Other

## 2020-04-01 ENCOUNTER — Ambulatory Visit
Admission: RE | Admit: 2020-04-01 | Discharge: 2020-04-01 | Disposition: A | Discharge status: Home / Self Care | Payer: Medicare Other | Source: Ambulatory Visit | Attending: Radiation Oncology | Admitting: Radiation Oncology

## 2020-04-01 ENCOUNTER — Telehealth: Payer: Self-pay

## 2020-04-01 ENCOUNTER — Other Ambulatory Visit: Payer: Self-pay

## 2020-04-01 VITALS — BP 116/53 | HR 72 | Temp 98.5°F | Resp 18 | Ht 63.0 in | Wt 175.2 lb

## 2020-04-01 DIAGNOSIS — M549 Dorsalgia, unspecified: Secondary | ICD-10-CM | POA: Insufficient documentation

## 2020-04-01 DIAGNOSIS — D0512 Intraductal carcinoma in situ of left breast: Secondary | ICD-10-CM | POA: Insufficient documentation

## 2020-04-01 DIAGNOSIS — N644 Mastodynia: Secondary | ICD-10-CM | POA: Insufficient documentation

## 2020-04-01 DIAGNOSIS — Z79899 Other long term (current) drug therapy: Secondary | ICD-10-CM | POA: Insufficient documentation

## 2020-04-01 NOTE — Telephone Encounter (Signed)
Reviewed patient's chart and saw that COVID booster appointment was canceled due to lack of proof of 2nd vaccine. Called patient and left a VM to see if she would like help getting rescheduled for another appointment here at [facility], or if she would prefer to receive the booster at a local pharmacy. Direct callback number provided and requested a call back with update on preference and needs.

## 2020-04-04 ENCOUNTER — Encounter: Payer: Self-pay | Admitting: Radiation Oncology

## 2020-04-04 NOTE — Progress Notes (Signed)
Radiation Oncology         [phone] ________________________________  Name: [name] MRN: [id number]  Date: 04/01/2020  DOB: 1946/02/18  Follow-Up Visit Note  Outpatient  CC: Karen Morale, MD  Karen Feeling, NP  Diagnosis and Prior Radiotherapy:    ICD-10-CM   1. Ductal carcinoma in situ (DCIS) of left breast  D05.12     CHIEF COMPLAINT: Here for follow-up and surveillance of breast cancer  Narrative:   [name] presents today for follow-up after completing radiation to her left breast on 03/04/2020  Skin: Overall looks well healed, with small area of hyperpigmentation around areola. There are some small patches of peeling under breast where skin is staying warm/moist. Pain: Reports a throbbing pain in the left upper quadrant of her left breast. On palpation patient does have a firm area of tissue in location of discomfort. Patient also has ongoing right hip/back pain Swelling: None noticed on assessment ROM: Patient denies any issues/concerns Med-Onc F/U: Last saw Dr. Truitt Merle on 03/23/2020:  "-f/u with NP [name] in 6 months  -Mammogram [facility] in 08/2020 -continue breast cancer surveillance"  Other concerns: Nothing of note.  Vitals:   04/01/20 1125  BP: (!) 116/53  Pulse: 72  Resp: 18  Temp: 98.5 F (36.9 C)  SpO2: 100%   Wt Readings from Last 3 Encounters:  04/01/20 175 lb 4 oz (79.5 kg)  03/23/20 175 lb 12.8 oz (79.7 kg)  03/09/20 176 lb 9.6 oz (80.1 kg)                                 ALLERGIES:  has No Known Allergies.  Meds: Current Outpatient Medications  Medication Sig Dispense Refill  . ACCU-CHEK GUIDE test strip 1 each by Other route in the morning and at bedtime.    . diclofenac sodium (VOLTAREN) 1 % GEL Apply 2 g topically 4 (four) times daily as needed (for pain).     Marland Kitchen ergocalciferol (VITAMIN D2) 50000 UNITS capsule Take 50,000 Units by mouth every Saturday. On Saturday.    Marland Kitchen FARXIGA 10 MG TABS tablet Take 10 mg by  mouth every morning.    . furosemide (LASIX) 40 MG tablet Take 1 tablet (40 mg total) by mouth daily. 90 tablet 3  . HUMALOG 100 UNIT/ML injection     . hydrALAZINE (APRESOLINE) 100 MG tablet TAKE 1 TABLET BY MOUTH THREE TIMES DAILY 270 tablet 2  . Insulin Disposable Pump (V-GO 40) KIT Inject 1 application into the skin See admin instructions.    . Insulin Human (INSULIN PUMP) SOLN Inject into the skin. Uses Novolog insulin for pump    . irbesartan (AVAPRO) 300 MG tablet TAKE 1 TABLET BY MOUTH ONCE DAILY . APPOINTMENT REQUIRED FOR FUTURE REFILLS 90 tablet 3  . isosorbide mononitrate (IMDUR) 60 MG 24 hr tablet Take 1.5 tablets (90 mg total) by mouth daily. 135 tablet 2  . latanoprost (XALATAN) 0.005 % ophthalmic solution Place 1 drop into both eyes at bedtime.     . metoprolol succinate (TOPROL-XL) 100 MG 24 hr tablet TAKE 1 TABLET BY MOUTH ONCE DAILY OR  IMMEDIATELY  FOLLOWING  A  MEAL 90 tablet 3  . montelukast (SINGULAIR) 10 MG tablet Take 10 mg by mouth daily.    . nitroGLYCERIN (NITROSTAT) 0.4 MG SL tablet Place 1 tablet (0.4 mg total) under the tongue every 5 (five) minutes as needed  for chest pain. 25 tablet 1  . nystatin-triamcinolone (MYCOLOG II) cream Apply 1 application topically 2 (two) times daily. 30 g 2  . OZEMPIC, 1 MG/DOSE, 4 MG/3ML SOPN Inject 1 mg into the skin once a week.    . pantoprazole (PROTONIX) 40 MG tablet Take 1 tablet by mouth once daily 90 tablet 0  . rosuvastatin (CRESTOR) 40 MG tablet Take 1 tablet by mouth once daily 90 tablet 3  . spironolactone (ALDACTONE) 50 MG tablet Take 1 tablet (50 mg total) by mouth daily. 90 tablet 3   No current facility-administered medications for this encounter.    Physical Findings: The patient is in no acute distress. Patient is alert and oriented.  height is 5' 3"  (1.6 m) and weight is 175 lb 4 oz (79.5 kg). Her oral temperature is 98.5 F (36.9 C). Her blood pressure is 116/53 (abnormal) and her pulse is 72. Her respiration is  18 and oxygen saturation is 100%. .    Satisfactory skin healing in radiotherapy fields.  There is a small area of hyperpigmentation around Left areola. There are some small patches of peeling under Left breast at IM fold. On palpation patient does have scar tissue in the lumpectomy region, not a  clinical concern.   Lab Findings: Lab Results  Component Value Date   WBC 8.0 03/23/2020   HGB 13.1 03/23/2020   HCT 41.5 03/23/2020   MCV 88.5 03/23/2020   PLT 177 03/23/2020    Radiographic Findings: VAS US CAROTID  Result Date: 03/30/2020 Carotid Arterial Duplex Study Indications:       Right endarterectomy. Risk Factors:      Hypertension, hyperlipidemia, coronary artery disease. Comparison Study:  01/02/19: Bilateral 1-39% stenosis. Right CEA patent. Performing Technologist: Ralene Cork RVT  Examination Guidelines: A complete evaluation includes B-mode imaging, spectral Doppler, color Doppler, and power Doppler as needed of all accessible portions of each vessel. Bilateral testing is considered an integral part of a complete examination. Limited examinations for reoccurring indications may be performed as noted.  Right Carotid Findings: +----------+--------+--------+--------+------------------+--------+           PSV cm/sEDV cm/sStenosisPlaque DescriptionComments +----------+--------+--------+--------+------------------+--------+ CCA Prox  91      11                                         +----------+--------+--------+--------+------------------+--------+ CCA Mid   79      11                                         +----------+--------+--------+--------+------------------+--------+ CCA Distal80      10                                         +----------+--------+--------+--------+------------------+--------+ ICA Prox  69      15      1-39%                              +----------+--------+--------+--------+------------------+--------+ ICA Mid   68      21                                          +----------+--------+--------+--------+------------------+--------+  ICA Distal75      23                                         +----------+--------+--------+--------+------------------+--------+ ECA       141     0                                          +----------+--------+--------+--------+------------------+--------+ +----------+--------+-------+----------------+-------------------+           PSV cm/sEDV cmsDescribe        Arm Pressure (mmHG) +----------+--------+-------+----------------+-------------------+ [id number]            Multiphasic, WNL                    +----------+--------+-------+----------------+-------------------+ +---------+--------+--+--------+--+---------+ VertebralPSV cm/s65EDV cm/s12Antegrade +---------+--------+--+--------+--+---------+  Left Carotid Findings: +----------+--------+--------+--------+------------------+--------+           PSV cm/sEDV cm/sStenosisPlaque DescriptionComments +----------+--------+--------+--------+------------------+--------+ CCA Prox  141     14                                         +----------+--------+--------+--------+------------------+--------+ CCA Mid   103     13                                         +----------+--------+--------+--------+------------------+--------+ CCA Distal93      12                                         +----------+--------+--------+--------+------------------+--------+ ICA Prox  87      26      1-39%   calcific                   +----------+--------+--------+--------+------------------+--------+ ICA Mid   78      22                                         +----------+--------+--------+--------+------------------+--------+ ICA Distal81      31                                         +----------+--------+--------+--------+------------------+--------+ ECA       116     0               heterogenous                +----------+--------+--------+--------+------------------+--------+ +----------+--------+--------+----------------+-------------------+           PSV cm/sEDV cm/sDescribe        Arm Pressure (mmHG) +----------+--------+--------+----------------+-------------------+ [id number]             Multiphasic, WNL                    +----------+--------+--------+----------------+-------------------+ +---------+--------+--+--------+-+---------+ VertebralPSV cm/s49EDV cm/s0Antegrade +---------+--------+--+--------+-+---------+   Summary: Right Carotid: Velocities in the right ICA are consistent with a  1-39% stenosis.                CEA patent. Left Carotid: Velocities in the left ICA are consistent with a 1-39% stenosis. Vertebrals:  Bilateral vertebral arteries demonstrate antegrade flow. Subclavians: Normal flow hemodynamics were seen in bilateral subclavian              arteries. *See table(s) above for measurements and observations.  Electronically signed by Deitra Mayo MD on 03/30/2020 at 12:50:29 PM.    Final     Impression/Plan: Healing well from radiotherapy to the breast tissue.  Continue skin care with topical Vitamin E Oil and / or lotion for at least 2 more months for further healing.  I encouraged her to continue with yearly mammography as appropriate (for intact breast tissue) and followup with medical oncology. I will see her back on an as-needed basis. I have encouraged her to call if she has any issues or concerns in the future. I wished her the very best.  We discussed measures to reduce the risk of infection during the COVID-19 pandemic.  She is interested in receiving her booster.  I think this is a good idea.  We arrange for the patient to receive her booster in the cancer center today, but unfortunately this was not given to her as she was informed that she needed her vaccination cards readily available. Katelin RN has reached out to her to see if we can  reschedule this.  On date of service, in total, I spent 15 minutes on this encounter. Patient was seen in person.  _____________________________________   Eppie Gibson, MD

## 2020-04-05 ENCOUNTER — Telehealth: Payer: Self-pay | Admitting: Family Medicine

## 2020-04-05 NOTE — Telephone Encounter (Signed)
Patient is returning call to the office for lab or test results.

## 2020-04-05 NOTE — Telephone Encounter (Addendum)
The patient is calling for results of her carotid ultrasound done by Dr. Nona Dell office on 03/30/20. She states his office said she had to get results from you.    Patient stated she had a difficult time getting in touch with [place] and to leave her a VM, which is also noted in her DPR. She has an active MyChart account, but stated she didn't know how to use it.

## 2020-04-08 ENCOUNTER — Telehealth: Payer: Self-pay

## 2020-04-08 NOTE — Telephone Encounter (Signed)
I'm sorry, but I do not see a result from 04/04/20, nor do I see an order in

## 2020-04-08 NOTE — Telephone Encounter (Signed)
See my Result Note from 04-04-20

## 2020-04-08 NOTE — Telephone Encounter (Signed)
Patient called and wants to go to [facility]  With 8 other people she is vaccinated I advised her to ask about vaccination status of those that will be in the vehicle with her and to wear a mask and ask that others in the car wear a mask. States she could not get her third vaccine because she had no proof of having the second vaccine. She went to [facility] and they did not sign her card the second dose.

## 2020-04-08 NOTE — Telephone Encounter (Signed)
Called patient with results and recommendations.

## 2020-04-12 ENCOUNTER — Ambulatory Visit
Admission: RE | Admit: 2020-04-12 | Discharge: 2020-04-12 | Disposition: A | Discharge status: Home / Self Care | Payer: Medicare Other | Source: Ambulatory Visit | Attending: Otolaryngology | Admitting: Otolaryngology

## 2020-04-12 ENCOUNTER — Other Ambulatory Visit: Payer: Self-pay

## 2020-04-12 DIAGNOSIS — H9190 Unspecified hearing loss, unspecified ear: Secondary | ICD-10-CM | POA: Diagnosis not present

## 2020-04-12 DIAGNOSIS — G9389 Other specified disorders of brain: Secondary | ICD-10-CM | POA: Diagnosis not present

## 2020-04-12 DIAGNOSIS — I6782 Cerebral ischemia: Secondary | ICD-10-CM | POA: Diagnosis not present

## 2020-04-12 DIAGNOSIS — H918X9 Other specified hearing loss, unspecified ear: Secondary | ICD-10-CM

## 2020-04-12 DIAGNOSIS — I6389 Other cerebral infarction: Secondary | ICD-10-CM | POA: Diagnosis not present

## 2020-04-12 MED ORDER — GADOBENATE DIMEGLUMINE 529 MG/ML IV SOLN
16.0000 mL | Freq: Once | INTRAVENOUS | Status: AC | PRN
  Administered 2020-04-12: 16 mL via INTRAVENOUS

## 2020-04-12 NOTE — Progress Notes (Signed)
  Patient Name: [name] MRN: [id number] DOB: [birth year] Referring Physician: Cira Rue Date of Service: 03/04/2020 [facility], [place]                                                        End Of Treatment Note  Diagnoses: D05.12-Intraductal carcinoma in situ of left breast  Cancer Staging:  Stage 0 Left Breast DCIS, ER+ / PR+, Grade 2  Intent: Curative  Radiation Treatment Dates: 02/11/2020 through 03/04/2020 Site Technique Total Dose (Gy) Dose per Fx (Gy) Completed Fx Beam Energies  Breast, Left: Breast_Lt 3D 42.56/42.56 2.66 16/16 10X   Narrative: The patient tolerated radiation therapy relatively well.   Plan: The patient will follow-up with radiation oncology in 16mo .  -----------------------------------  Eppie Gibson, MD

## 2020-04-13 ENCOUNTER — Telehealth: Payer: Self-pay | Admitting: Family Medicine

## 2020-04-13 NOTE — Telephone Encounter (Signed)
Pt called back and stated that she declined the [facility] with the Nurse Health Advisor.  Pt stated that she saw Dr. Sarajane Jews and he said that everything is okay so she declined.

## 2020-04-13 NOTE — Telephone Encounter (Signed)
Left message for patient to schedule Annual Wellness Visit.  Please schedule with Nurse Health Advisor Shannon Crews, RN at [facility]  

## 2020-04-15 ENCOUNTER — Telehealth: Payer: Self-pay

## 2020-04-15 NOTE — Telephone Encounter (Signed)
Received VM from patient that she was able to receive COVID booster shot at [facility] today 04/15/20. Will document under patient's immunization history and make Dr. Isidore Moos aware.

## 2020-04-21 ENCOUNTER — Ambulatory Visit: Payer: Medicare Other | Admitting: Surgery

## 2020-04-21 DIAGNOSIS — M545 Low back pain, unspecified: Secondary | ICD-10-CM | POA: Diagnosis not present

## 2020-04-21 DIAGNOSIS — M7062 Trochanteric bursitis, left hip: Secondary | ICD-10-CM | POA: Diagnosis not present

## 2020-04-21 DIAGNOSIS — M7061 Trochanteric bursitis, right hip: Secondary | ICD-10-CM | POA: Diagnosis not present

## 2020-04-28 ENCOUNTER — Ambulatory Visit: Payer: Medicare Other | Admitting: Nurse Practitioner

## 2020-04-28 DIAGNOSIS — H903 Sensorineural hearing loss, bilateral: Secondary | ICD-10-CM | POA: Diagnosis not present

## 2020-04-28 DIAGNOSIS — H838X3 Other specified diseases of inner ear, bilateral: Secondary | ICD-10-CM | POA: Diagnosis not present

## 2020-05-02 ENCOUNTER — Ambulatory Visit: Payer: Medicare Other | Admitting: Nurse Practitioner

## 2020-05-23 ENCOUNTER — Other Ambulatory Visit: Payer: Self-pay | Admitting: Physician Assistant

## 2020-05-23 ENCOUNTER — Other Ambulatory Visit: Payer: Self-pay | Admitting: Family Medicine

## 2020-05-26 DIAGNOSIS — E1152 Type 2 diabetes mellitus with diabetic peripheral angiopathy with gangrene: Secondary | ICD-10-CM | POA: Diagnosis not present

## 2020-05-26 DIAGNOSIS — Z794 Long term (current) use of insulin: Secondary | ICD-10-CM | POA: Diagnosis not present

## 2020-05-26 DIAGNOSIS — I10 Essential (primary) hypertension: Secondary | ICD-10-CM | POA: Diagnosis not present

## 2020-05-26 DIAGNOSIS — Z23 Encounter for immunization: Secondary | ICD-10-CM | POA: Diagnosis not present

## 2020-05-26 DIAGNOSIS — E1159 Type 2 diabetes mellitus with other circulatory complications: Secondary | ICD-10-CM | POA: Diagnosis not present

## 2020-05-26 DIAGNOSIS — N1831 Chronic kidney disease, stage 3a: Secondary | ICD-10-CM | POA: Diagnosis not present

## 2020-07-07 DIAGNOSIS — M5442 Lumbago with sciatica, left side: Secondary | ICD-10-CM | POA: Diagnosis not present

## 2020-07-07 DIAGNOSIS — M7061 Trochanteric bursitis, right hip: Secondary | ICD-10-CM | POA: Diagnosis not present

## 2020-07-07 DIAGNOSIS — M545 Low back pain, unspecified: Secondary | ICD-10-CM | POA: Diagnosis not present

## 2020-07-07 DIAGNOSIS — M25551 Pain in right hip: Secondary | ICD-10-CM | POA: Diagnosis not present

## 2020-07-12 ENCOUNTER — Other Ambulatory Visit: Payer: Self-pay | Admitting: Physician Assistant

## 2020-07-12 DIAGNOSIS — I10 Essential (primary) hypertension: Secondary | ICD-10-CM

## 2020-07-14 ENCOUNTER — Other Ambulatory Visit: Payer: Self-pay

## 2020-07-14 ENCOUNTER — Ambulatory Visit
Admission: EM | Admit: 2020-07-14 | Discharge: 2020-07-14 | Disposition: A | Discharge status: Home / Self Care | Payer: Medicare Other | Attending: Internal Medicine | Admitting: Internal Medicine

## 2020-07-14 DIAGNOSIS — M546 Pain in thoracic spine: Secondary | ICD-10-CM

## 2020-07-14 MED ORDER — IBUPROFEN 600 MG PO TABS: 600.0000 mg | ORAL_TABLET | Freq: Four times a day (QID) | ORAL | 0 refills | Status: DC | PRN
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
## 2020-07-14 NOTE — Discharge Instructions (Signed)
Please take medications as directed Heating pad use will be beneficial Gentle range of motion exercises If symptoms worsen please return to the urgent care to be reevaluated. No signs or symptoms of concussion.

## 2020-07-15 ENCOUNTER — Other Ambulatory Visit: Payer: Self-pay

## 2020-07-15 ENCOUNTER — Ambulatory Visit (INDEPENDENT_AMBULATORY_CARE_PROVIDER_SITE_OTHER): Payer: Medicare Other | Admitting: Family Medicine

## 2020-07-15 ENCOUNTER — Encounter: Payer: Self-pay | Admitting: Family Medicine

## 2020-07-15 VITALS — BP 130/70 | Temp 98.1°F | Wt 172.3 lb

## 2020-07-15 DIAGNOSIS — S29019D Strain of muscle and tendon of unspecified wall of thorax, subsequent encounter: Secondary | ICD-10-CM

## 2020-07-15 DIAGNOSIS — S161XXD Strain of muscle, fascia and tendon at neck level, subsequent encounter: Secondary | ICD-10-CM | POA: Diagnosis not present

## 2020-07-15 MED ORDER — CYCLOBENZAPRINE HCL 10 MG PO TABS: 10.0000 mg | ORAL_TABLET | Freq: Three times a day (TID) | ORAL | 2 refills | Status: DC | PRN
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
## 2020-07-21 ENCOUNTER — Other Ambulatory Visit: Payer: Self-pay

## 2020-07-21 DIAGNOSIS — M545 Low back pain, unspecified: Secondary | ICD-10-CM | POA: Diagnosis not present

## 2020-07-21 DIAGNOSIS — M5441 Lumbago with sciatica, right side: Secondary | ICD-10-CM | POA: Diagnosis not present

## 2020-07-21 DIAGNOSIS — M25551 Pain in right hip: Secondary | ICD-10-CM | POA: Diagnosis not present

## 2020-07-23 ENCOUNTER — Other Ambulatory Visit: Payer: Self-pay | Admitting: Orthopedic Surgery

## 2020-07-23 DIAGNOSIS — M25551 Pain in right hip: Secondary | ICD-10-CM

## 2020-07-23 DIAGNOSIS — M545 Low back pain, unspecified: Secondary | ICD-10-CM

## 2020-07-25 ENCOUNTER — Ambulatory Visit: Payer: Medicare Other | Admitting: Physician Assistant

## 2020-07-26 ENCOUNTER — Telehealth: Payer: Self-pay | Admitting: Family Medicine

## 2020-07-26 DIAGNOSIS — S29019D Strain of muscle and tendon of unspecified wall of thorax, subsequent encounter: Secondary | ICD-10-CM

## 2020-07-26 DIAGNOSIS — S161XXD Strain of muscle, fascia and tendon at neck level, subsequent encounter: Secondary | ICD-10-CM

## 2020-07-26 NOTE — Telephone Encounter (Signed)
I ordered the PT. They will be calling her

## 2020-07-26 NOTE — Telephone Encounter (Signed)
Patient is calling and wanted to let the provider know that she does want to do therapy for neck, shoulder and back pain, please advise. CB is [phone]

## 2020-07-27 NOTE — Telephone Encounter (Signed)
Called patient lVM

## 2020-08-11 ENCOUNTER — Other Ambulatory Visit: Payer: Self-pay | Admitting: Family Medicine

## 2020-08-12 ENCOUNTER — Other Ambulatory Visit: Payer: Self-pay

## 2020-08-12 ENCOUNTER — Ambulatory Visit: Payer: Medicare Other | Attending: Family Medicine

## 2020-08-12 DIAGNOSIS — S161XXD Strain of muscle, fascia and tendon at neck level, subsequent encounter: Secondary | ICD-10-CM | POA: Diagnosis not present

## 2020-08-12 DIAGNOSIS — R293 Abnormal posture: Secondary | ICD-10-CM | POA: Diagnosis not present

## 2020-08-12 DIAGNOSIS — S29019D Strain of muscle and tendon of unspecified wall of thorax, subsequent encounter: Secondary | ICD-10-CM | POA: Diagnosis not present

## 2020-08-12 DIAGNOSIS — R262 Difficulty in walking, not elsewhere classified: Secondary | ICD-10-CM | POA: Diagnosis not present

## 2020-08-12 NOTE — Patient Instructions (Signed)
Access Code: [email or web address] URL: [email or web address] Date: 08/12/2020 Prepared by: Kathreen Cornfield  Exercises Seated Assisted Cervical Rotation with Towel - 3 x daily - 7 x weekly - 1-2 sets - 10 reps Seated Cervical Traction - 3 x daily - 7 x weekly - 2 sets - 10 reps

## 2020-08-12 NOTE — Therapy (Signed)
[place], [name], [address] Phone: [phone]   Fax:  [phone]  Physical Therapy Evaluation  Patient Details  Name: [name] MRN: [id number] Date of Birth: 06/29/1946 Referring Provider (PT): Laurey Morale, MD   Encounter Date: 08/12/2020   PT End of Session - 08/12/20 1132    Visit Number 1    Number of Visits 16    Date for PT Re-Evaluation 10/07/20    Authorization Type Medicare    Authorization Time Period FOTO visit #6, #10    Progress Note Due on Visit 10    PT Start Time 1112   Pt arrived late   PT Stop Time 1145    PT Time Calculation (min) 33 min    Equipment Utilized During Treatment --   [facility]   Activity Tolerance Patient tolerated treatment well    Behavior During Therapy [facility] for tasks assessed/performed           Past Medical History:  Diagnosis Date  . Abnormal nuclear stress test 12/29/2014  . Allergy   . Anemia, unspecified   . Arthritis   . Blood transfusion without reported diagnosis    2018  . CAD (coronary artery disease)    sees. Dr. Burt Knack. s/p cath with PCI of RCA (xience des) June 2010. Pt also with LAD and D2 dzs-NL FFR in both areas med Rx  . Carotid artery occlusion   . Cataract    sees Dr. Herbert Deaner   . Depressive disorder, not elsewhere classified   . Duodenal nodule   . Edema   . Fibromyalgia   . Gastric polyps    COLON  . GERD (gastroesophageal reflux disease)   . Glaucoma    narrow angle, sees Dr. Herbert Deaner   . Headache(784.0)   . Hearing loss    left ear  . Heart murmur    questionable  . Hemorrhoids   . HSV (herpes simplex virus) anogenital infection 05/2014  . HTN (hypertension)   . Hyperlipidemia   . Low back pain    she sees Dr. Louanne Skye, also Dr. Ernestina Patches for pain medications and Benjiman Core PA for injections   . Obesity   . Osteopenia 01/2018   T score -1.6 FRAX 7.1% / 1.4%  . Peripheral vascular disease ([place])   . Stroke [facility])    TIA   "many yrs ago"    . TIA (transient ischemic attack)    also Hx of it.   . Type II or unspecified type diabetes mellitus without mention of complication, not stated as uncontrolled    sees Dr. Carrolyn Meiers     Past Surgical History:  Procedure Laterality Date  . ABDOMINAL HYSTERECTOMY  age 85   TAH and USO  Leiomyomata  . ANTERIOR CERVICAL CORPECTOMY N/A 06/11/2014   Procedure: ANTERIOR CERVICAL CORPECTOMY CERVICAL SIX; WITH CERVICAL FIVE TO CERVICAL SEVEN ARTHRODESIS;  Surgeon: Ashok Pall, MD;  Location: [place] [facility];  Service: Neurosurgery;  Laterality: N/A;  . BACK SURGERY  2008   lumb fusion  . BREAST BIOPSY Left 01/26/2014   Procedure: EXCISION LEFT BREAST MASS;  Surgeon: Adin Hector, MD;  Location: [place];  Service: General;  Laterality: Left;  . BREAST LUMPECTOMY WITH RADIOACTIVE SEED LOCALIZATION Left 07/24/2017   Procedure: LEFT BREAST LUMPECTOMY WITH RADIOACTIVE SEED LOCALIZATION ERAS;  Surgeon: Erroll Luna, MD;  Location: [place];  Service: General;  Laterality: Left;  . BREAST LUMPECTOMY WITH RADIOACTIVE  SEED LOCALIZATION Left 01/13/2020   Procedure: LEFT BREAST LUMPECTOMY WITH RADIOACTIVE SEED LOCALIZATION;  Surgeon: Erroll Luna, MD;  Location: [place];  Service: General;  Laterality: Left;  . CARDIAC CATHETERIZATION    . CARDIAC CATHETERIZATION N/A 12/29/2014   Procedure: Left Heart Cath and Coronary Angiography;  Surgeon: Sherren Mocha, MD;  Location: [place] CV LAB;  Service: Cardiovascular;  Laterality: N/A;  . CAROTID ENDARTERECTOMY  march 2012   per Dr. Morton Amy  . CARPAL TUNNEL RELEASE Left 05/14/2014   Procedure: Left Carpal tunnel release;  Surgeon: Ashok Pall, MD;  Location: [place] [facility];  Service: Neurosurgery;  Laterality: Left;  Left Carpal tunnel release  . CARPAL TUNNEL RELEASE Bilateral   . CHOLECYSTECTOMY    . COLONOSCOPY  06/26/2017   per Dr. Henrene Pastor, adenomatous polyp, repeat in  5 yrs    . CORONARY STENT PLACEMENT     Dr. Burt Knack  . DILATION AND CURETTAGE OF UTERUS    . ESOPHAGOGASTRODUODENOSCOPY  02-08-06   per Dr. Henrene Pastor, gastritis   . EYE SURGERY Left June 2016   Cataract  . GLAUCOMA SURGERY     with laser, per Dr. Henrene Pastor   . HARDWARE REMOVAL  10/30/2016   Procedure: HARDWARE REMOVAL;  Surgeon: Jessy Oto, MD;  Location: [place];  Service: Orthopedics;;  . LUMBAR FUSION  2008  . POLYPECTOMY    . POSTERIOR LUMBAR FUSION  10/30/2016   Removal of hardware rods and pedicle screws lumbar four, Extension of fusion L4-5 to L5-S1 with reinsertion of screws at L 4, left L5-S1 transforaminal lumbar interbody fusion, Exploration  left L5 nerve root, bilateral decompressive laminectomy L3-4/notes 10/30/2016  . ULNAR NERVE TRANSPOSITION Left 05/14/2014   Procedure: Left Ulnar nerve decompression;  Surgeon: Ashok Pall, MD;  Location: [place] [facility];  Service: Neurosurgery;  Laterality: Left;  Left Ulnar nerve decompression    There were no vitals filed for this visit.    Subjective Assessment - 08/12/20 1124    Subjective Pt reports neck and upper back pain starting around the first of January. Her neck and upper back have been hurting since her back surgery a few years ago, especially R upper trap region going down back. She has a very hard time lifting and cannot lift 35# grandson onto her lap. She has her exercises from 2019 and some days she can do them.    Pertinent History Can't hear out of L ear, back surgery ~3 years ago    Limitations House hold activities;Writing;Lifting   turning head to L and looking up   How long can you walk comfortably? 2-3 min ~75 feet    Patient Stated Goals to try to get better than what she is    Currently in Pain? Yes    Pain Score 5     Pain Location Neck    Pain Orientation Right    Pain Descriptors / Indicators Radiating    Pain Type Chronic pain    Pain Radiating Towards subocciput to right shoulder blade    Pain Onset More than a  month ago    Pain Frequency Intermittent    Aggravating Factors  turning head, looking up, lifting    Pain Relieving Factors heat    Effect of Pain on Daily Activities difficulty lifting weight, writing in calendar on wall, looking up, turning head    Multiple Pain Sites Yes    Pain Score 7    Pain Location Thoracic    Pain  Orientation Mid    Pain Descriptors / Indicators Aching    Pain Type Chronic pain    Pain Radiating Towards across shoulder blades, worse on right    Pain Onset More than a month ago    Pain Frequency Intermittent    Aggravating Factors  walking    Pain Relieving Factors heat    Effect of Pain on Daily Activities cannot lift grandson              [facility] PT Assessment - 08/12/20 0001      Assessment   Medical Diagnosis Neck Strain, Thoracic Myofascial Strain    Referring Provider (PT) Laurey Morale, MD    Onset Date/Surgical Date 07/09/20    Hand Dominance Right    Next MD Visit after done with PT    Prior Therapy Yes      Precautions   Precautions None      Restrictions   Weight Bearing Restrictions No      Balance Screen   Has the patient fallen in the past 6 months No    Has the patient had a decrease in activity level because of a fear of falling?  Yes    Is the patient reluctant to leave their home because of a fear of falling?  No      Prior Function   Vocation Retired    Leisure playing with grandson      Observation/Other Assessments   Focus on Therapeutic Outcomes (FOTO)  Lumbar  39%, Neck 52%      Posture/Postural Control   Posture/Postural Control Postural limitations    Postural Limitations Rounded Shoulders;Forward head;Increased thoracic kyphosis;Flexed trunk      ROM / Strength   AROM / PROM / Strength AROM      AROM   AROM Assessment Site Cervical    Cervical Flexion 27    Cervical Extension 8    Cervical - Right Side Bend 15    Cervical - Left Side Bend 11    Cervical - Right Rotation 41    Cervical - Left Rotation 21    pain in R     Ambulation/Gait   Ambulation/Gait Yes    Ambulation/Gait Assistance 6: Modified independent (Device/Increase time)    Ambulation Distance (Feet) 75 Feet    Assistive device Straight cane                      Objective measurements completed on examination: See above findings.               PT Education - 08/12/20 1540    Education Details FOTO, HEP, POC, diagnosis, prognosis    Person(s) Educated Patient    Methods Explanation;Demonstration;Tactile cues;Verbal cues;Handout    Comprehension Verbalized understanding;Returned demonstration;Verbal cues required;Tactile cues required            PT Short Term Goals - 08/12/20 1125      PT SHORT TERM GOAL #1   Title Pt will be independent and compliant with initial HEP.    Time 2    Period Weeks    Status New    Target Date 08/26/20             PT Long Term Goals - 08/12/20 1126      PT LONG TERM GOAL #1   Title Pt will be independent and compliant with long term HEP for maintenance.    Time 8    Period Weeks  Status New    Target Date 10/07/20      PT LONG TERM GOAL #2   Title Pt will report decrease in neck/thoracic pain to </=3/10 on a regular basis for improved tolerance to daily activity.    Baseline 5/10 neck, 7/10 thoracic    Status New      PT LONG TERM GOAL #3   Title Pt will increase lumbar FOTO ability to 58% and neck to 62%, in order to demonstrate improvement in perceived level of functional ability.    Baseline 39% lumbar, 52% neck    Time 8    Period Weeks    Status New    Target Date 10/07/20      PT LONG TERM GOAL #4   Title Pt will lift 15# from floor to waist and waist to OH pain free for improved functional lifting ability.    Baseline pain with any    Time 8    Period Weeks    Status New    Target Date 10/07/20      PT LONG TERM GOAL #5   Title Pt will ambulate 1644 ft for 6MWT, per age related norm, with LRAD.    Baseline needs to be completed     Time 8    Period Weeks    Status New    Target Date 10/07/20                  Plan - 08/12/20 1538    Clinical Impression Statement Pt is a 75 yo female who presents to clinic with neck and thoracic pain for the last 3 years since latest back surgery. Pt is unable to walk more than 75 feet without shortness of breath with SPC. Pt demonstrates impairments in posture, neck ROM, CV and muscular endurance, balance, and pain. She was edu on FOTO, HEP, POC, diagnosis, and prognosis. She would benefit from skilled PT to address deficits and maximize functional mobility for increased participation in daily activities and grandchild at 1-2x/weeek for 8 weeks.    Personal Factors and Comorbidities Comorbidity 3+;Time since onset of injury/illness/exacerbation;Past/Current Experience    Comorbidities Arthritis, DM, Glaucoma, heart murmur, HTN, HLD, stroke, cataracts    Examination-Activity Limitations Bend;Locomotion Level;Reach Overhead;Lift    Examination-Participation Restrictions Community Activity;Interpersonal Relationship;Cleaning;Laundry;Shop    Stability/Clinical Decision Making Evolving/Moderate complexity    Clinical Decision Making Moderate    Rehab Potential Fair    PT Frequency --   1-2x/week   PT Duration 8 weeks    PT Treatment/Interventions ADLs/Self Care Home Management;Cryotherapy;Electrical Stimulation;Iontophoresis 4mg /ml Dexamethasone;Moist Heat;Gait training;Stair training;Functional mobility training;Therapeutic activities;Therapeutic exercise;Balance training;Neuromuscular re-education;Patient/family education;Passive range of motion;Dry needling;Spinal Manipulations    PT Next Visit Plan [id number], assess response to HEP and add to it    PT Home Exercise Plan [id number]    Consulted and Agree with Plan of Care Patient           Patient will benefit from skilled therapeutic intervention in order to improve the following deficits and impairments:  Difficulty  walking,Decreased balance,Decreased endurance,Decreased mobility,Postural dysfunction,Pain,Impaired flexibility,Increased fascial restricitons,Decreased strength,Decreased activity tolerance,Decreased range of motion,Impaired perceived functional ability,Improper body mechanics  Visit Diagnosis: Strain of neck muscle, subsequent encounter - Plan: PT plan of care cert/re-cert  Thoracic myofascial strain, subsequent encounter - Plan: PT plan of care cert/re-cert  Poor posture - Plan: PT plan of care cert/re-cert  Difficulty in walking, not elsewhere classified - Plan: PT plan of care cert/re-cert     Problem List Patient Active  Problem List   Diagnosis Date Noted  . Ductal carcinoma in situ (DCIS) of left breast 01/06/2020  . Right-sided thoracic back pain 07/01/2019  . Hyperlipidemia 07/17/2017  . History of lumbar spinal fusion   . History of fusion of lumbar spine   . Anemia due to blood loss 11/02/2016    Class: Acute  . Lethargy 10/31/2016  . AKI (acute kidney injury) ([place]) 10/31/2016  . Chronic diastolic CHF (congestive heart failure) ([place]) 10/31/2016  . Cough   . Leukocytosis   . Spondylolisthesis, lumbar region 10/30/2016    Class: Chronic  . Spinal stenosis of lumbar region 10/30/2016    Class: Chronic  . Retained orthopedic hardware 10/30/2016    Class: Chronic  . Spinal stenosis of lumbar region with neurogenic claudication 10/30/2016  . Genetic testing 09/14/2015  . Atypical ductal hyperplasia of left breast 08/24/2015  . Family history of breast cancer   . Family history of prostate cancer   . Family history of stomach cancer   . Abnormal nuclear stress test 12/29/2014  . Cervical spondylosis with myelopathy and radiculopathy 06/11/2014  . Carpal tunnel syndrome 05/14/2014  . Aftercare following surgery of the circulatory system, [place] 03/11/2014  . Pain of right lower extremity 03/11/2014  . Atypical lobular hyperplasia of left breast 02/12/2014  .  Fibromyalgia 09/21/2013  . Carotid artery disease ([place]) 02/29/2012  . Back abscess 01/22/2012  . Chest pain with moderate risk of acute coronary syndrome 12/09/2009  . Shortness of breath 06/10/2009  . Orthostatic hypotension 01/31/2009  . CAD (coronary artery disease) 12/23/2008  . Normocytic anemia 07/21/2008  . Headache(784.0) 07/21/2008  . History of TIA (transient ischemic attack) 07/21/2008  . Edema 02/27/2008  . Diabetes mellitus with complication ([place]) 70/26/3785  . Depression 07/16/2007  . Dyslipidemia 02/21/2007  . Essential hypertension 02/21/2007  . Asthma 02/21/2007  . GERD 02/21/2007  . LOW BACK PAIN 02/21/2007    Izell Macdoel, PT, DPT 08/12/2020, 3:54 PM  [facility] [address] [place], [name], [address] Phone: [phone]   Fax:  [phone]  Name: [name] MRN: [id number] Date of Birth: May 04, 1946

## 2020-08-15 DIAGNOSIS — D0512 Intraductal carcinoma in situ of left breast: Secondary | ICD-10-CM | POA: Diagnosis not present

## 2020-08-17 ENCOUNTER — Telehealth: Payer: Self-pay

## 2020-08-17 NOTE — Telephone Encounter (Signed)
Prior auth approved for Cyclobenzaprine 10mg  from Jul 09 2020 to Nov 15 2020. Called [facility] pharmacy to notify of approval.

## 2020-08-30 ENCOUNTER — Telehealth: Payer: Self-pay | Admitting: Nurse Practitioner

## 2020-08-30 NOTE — Telephone Encounter (Signed)
Left message with moved upcoming appointment due to provider's template. Gave option to call back to reschedule if needed. 

## 2020-08-31 DIAGNOSIS — Z853 Personal history of malignant neoplasm of breast: Secondary | ICD-10-CM | POA: Diagnosis not present

## 2020-08-31 LAB — HM MAMMOGRAPHY

## 2020-09-01 ENCOUNTER — Encounter: Payer: Self-pay | Admitting: Physical Therapy

## 2020-09-01 ENCOUNTER — Ambulatory Visit: Payer: Medicare Other | Admitting: Physical Therapy

## 2020-09-01 ENCOUNTER — Other Ambulatory Visit: Payer: Self-pay

## 2020-09-01 DIAGNOSIS — S161XXD Strain of muscle, fascia and tendon at neck level, subsequent encounter: Secondary | ICD-10-CM

## 2020-09-01 DIAGNOSIS — R262 Difficulty in walking, not elsewhere classified: Secondary | ICD-10-CM | POA: Diagnosis not present

## 2020-09-01 DIAGNOSIS — R293 Abnormal posture: Secondary | ICD-10-CM

## 2020-09-01 DIAGNOSIS — S29019D Strain of muscle and tendon of unspecified wall of thorax, subsequent encounter: Secondary | ICD-10-CM | POA: Diagnosis not present

## 2020-09-01 NOTE — Patient Instructions (Signed)

## 2020-09-01 NOTE — Therapy (Signed)
[place], [name], [address] Phone: [phone]   Fax:  [phone]  Physical Therapy Treatment  Patient Details  Name: [name] MRN: [id number] Date of Birth: May 19, 1946 Referring Provider (PT): Laurey Morale, MD   Encounter Date: 09/01/2020   PT End of Session - 09/01/20 0941    Visit Number 2    Number of Visits 16    Date for PT Re-Evaluation 10/07/20    Authorization Type Medicare    Authorization Time Period FOTO visit #6, #10    Progress Note Due on Visit 10    PT Start Time 0940   pt. arrived late   PT Stop Time 1015    PT Time Calculation (min) 35 min    Activity Tolerance Patient tolerated treatment well    Behavior During Therapy [facility] for tasks assessed/performed           Past Medical History:  Diagnosis Date  . Abnormal nuclear stress test 12/29/2014  . Allergy   . Anemia, unspecified   . Arthritis   . Blood transfusion without reported diagnosis    2018  . CAD (coronary artery disease)    sees. Dr. Burt Knack. s/p cath with PCI of RCA (xience des) June 2010. Pt also with LAD and D2 dzs-NL FFR in both areas med Rx  . Carotid artery occlusion   . Cataract    sees Dr. Herbert Deaner   . Depressive disorder, not elsewhere classified   . Duodenal nodule   . Edema   . Fibromyalgia   . Gastric polyps    COLON  . GERD (gastroesophageal reflux disease)   . Glaucoma    narrow angle, sees Dr. Herbert Deaner   . Headache(784.0)   . Hearing loss    left ear  . Heart murmur    questionable  . Hemorrhoids   . HSV (herpes simplex virus) anogenital infection 05/2014  . HTN (hypertension)   . Hyperlipidemia   . Low back pain    she sees Dr. Louanne Skye, also Dr. Ernestina Patches for pain medications and Benjiman Core PA for injections   . Obesity   . Osteopenia 01/2018   T score -1.6 FRAX 7.1% / 1.4%  . Peripheral vascular disease ([place])   . Stroke [facility])    TIA  "many yrs ago"    . TIA (transient ischemic  attack)    also Hx of it.   . Type II or unspecified type diabetes mellitus without mention of complication, not stated as uncontrolled    sees Dr. Carrolyn Meiers     Past Surgical History:  Procedure Laterality Date  . ABDOMINAL HYSTERECTOMY  age 35   TAH and USO  Leiomyomata  . ANTERIOR CERVICAL CORPECTOMY N/A 06/11/2014   Procedure: ANTERIOR CERVICAL CORPECTOMY CERVICAL SIX; WITH CERVICAL FIVE TO CERVICAL SEVEN ARTHRODESIS;  Surgeon: Ashok Pall, MD;  Location: [place] [facility];  Service: Neurosurgery;  Laterality: N/A;  . BACK SURGERY  2008   lumb fusion  . BREAST BIOPSY Left 01/26/2014   Procedure: EXCISION LEFT BREAST MASS;  Surgeon: Adin Hector, MD;  Location: [place];  Service: General;  Laterality: Left;  . BREAST LUMPECTOMY WITH RADIOACTIVE SEED LOCALIZATION Left 07/24/2017   Procedure: LEFT BREAST LUMPECTOMY WITH RADIOACTIVE SEED LOCALIZATION ERAS;  Surgeon: Erroll Luna, MD;  Location: [place];  Service: General;  Laterality: Left;  . BREAST LUMPECTOMY WITH RADIOACTIVE SEED LOCALIZATION Left 01/13/2020   Procedure: LEFT BREAST LUMPECTOMY  WITH RADIOACTIVE SEED LOCALIZATION;  Surgeon: Erroll Luna, MD;  Location: [place];  Service: General;  Laterality: Left;  . CARDIAC CATHETERIZATION    . CARDIAC CATHETERIZATION N/A 12/29/2014   Procedure: Left Heart Cath and Coronary Angiography;  Surgeon: Sherren Mocha, MD;  Location: [place] [facility];  Service: Cardiovascular;  Laterality: N/A;  . CAROTID ENDARTERECTOMY  march 2012   per Dr. Morton Amy  . CARPAL TUNNEL RELEASE Left 05/14/2014   Procedure: Left Carpal tunnel release;  Surgeon: Ashok Pall, MD;  Location: [place] [facility];  Service: Neurosurgery;  Laterality: Left;  Left Carpal tunnel release  . CARPAL TUNNEL RELEASE Bilateral   . CHOLECYSTECTOMY    . COLONOSCOPY  06/26/2017   per Dr. Henrene Pastor, adenomatous polyp, repeat in 5 yrs    . CORONARY STENT PLACEMENT     Dr.  Burt Knack  . DILATION AND CURETTAGE OF UTERUS    . ESOPHAGOGASTRODUODENOSCOPY  02-08-06   per Dr. Henrene Pastor, gastritis   . EYE SURGERY Left June 2016   Cataract  . GLAUCOMA SURGERY     with laser, per Dr. Henrene Pastor   . HARDWARE REMOVAL  10/30/2016   Procedure: HARDWARE REMOVAL;  Surgeon: Jessy Oto, MD;  Location: [place];  Service: Orthopedics;;  . LUMBAR FUSION  2008  . POLYPECTOMY    . POSTERIOR LUMBAR FUSION  10/30/2016   Removal of hardware rods and pedicle screws lumbar four, Extension of fusion L4-5 to L5-S1 with reinsertion of screws at L 4, left L5-S1 transforaminal lumbar interbody fusion, Exploration  left L5 nerve root, bilateral decompressive laminectomy L3-4/notes 10/30/2016  . ULNAR NERVE TRANSPOSITION Left 05/14/2014   Procedure: Left Ulnar nerve decompression;  Surgeon: Ashok Pall, MD;  Location: [place] [facility];  Service: Neurosurgery;  Laterality: Left;  Left Ulnar nerve decompression    There were no vitals filed for this visit.   Subjective Assessment - 09/01/20 0943    Subjective Pt. reports status about the same or a little worse since initial eval with delay getting in for follow up tx. Pain 5-6/10 this AM in same regions right upper trapezius into thoracic region and also pain caudally to right lumbar, gluteal and lateral hip region.    Pertinent History Can't hear out of L ear, back surgery ~3 years ago    Currently in Pain? Yes    Pain Score --   5-6/10   Pain Location Neck    Pain Orientation Right    Pain Descriptors / Indicators Radiating    Pain Type Chronic pain    Pain Radiating Towards right upper trapezius, thoracic, lumbar region and posterolateral hip    Pain Onset More than a month ago    Pain Frequency Intermittent    Aggravating Factors  turning head, looking up, lifting    Pain Relieving Factors heat    Effect of Pain on Daily Activities difficulty lifting weight, writing in calnedar on wall, looking up, turning head                              [facility] Adult PT Treatment/Exercise - 09/01/20 0001      Exercises   Exercises Neck;Lumbar      Lumbar Exercises: Stretches   Lower Trunk Rotation 5 reps;10 seconds    Other Lumbar Stretch Exercise supine manual right glut stretch 3x20 sec      Manual Therapy   Manual Therapy Soft tissue mobilization  Soft tissue mobilization STM/IASDM with roller use right thoracic paraspinals and foam roll erector spinae caudally to gluteal region on right      Neck Exercises: Stretches   Upper Trapezius Stretch Right;3 reps;20 seconds    Upper Trapezius Stretch Limitations also added to HEP/reviewed HEP handout            Trigger Point Dry Needling - 09/01/20 0001    Consent Given? Yes    Education Handout Provided Yes    Muscles Treated Head and Neck Upper trapezius    Muscles Treated Back/Hip Erector spinae   thoracic iliocostalis on right   Dry Needling Comments needling in left sidelying with pillow between knees with 32 gauge 30 mm needles    Upper Trapezius Response Twitch reponse elicited                PT Education - 09/01/20 1025    Education Details dry needling, HEP update    Person(s) Educated Patient    Methods Explanation;Demonstration;Verbal cues;Handout    Comprehension Returned demonstration;Verbalized understanding            PT Short Term Goals - 08/12/20 1125      PT SHORT TERM GOAL #1   Title Pt will be independent and compliant with initial HEP.    Time 2    Period Weeks    Status New    Target Date 08/26/20             PT Long Term Goals - 08/12/20 1126      PT LONG TERM GOAL #1   Title Pt will be independent and compliant with long term HEP for maintenance.    Time 8    Period Weeks    Status New    Target Date 10/07/20      PT LONG TERM GOAL #2   Title Pt will report decrease in neck/thoracic pain to </=3/10 on a regular basis for improved tolerance to daily activity.    Baseline 5/10 neck, 7/10  thoracic    Status New      PT LONG TERM GOAL #3   Title Pt will increase lumbar FOTO ability to 58% and neck to 62%, in order to demonstrate improvement in perceived level of functional ability.    Baseline 39% lumbar, 52% neck    Time 8    Period Weeks    Status New    Target Date 10/07/20      PT LONG TERM GOAL #4   Title Pt will lift 15# from floor to waist and waist to OH pain free for improved functional lifting ability.    Baseline pain with any    Time 8    Period Weeks    Status New    Target Date 10/07/20      PT LONG TERM GOAL #5   Title Pt will ambulate 1644 ft for 6MWT, per age related norm, with LRAD.    Baseline needs to be completed    Time 8    Period Weeks    Status New    Target Date 10/07/20                 Plan - 09/01/20 1026    Clinical Impression Statement Limited progess since eval but status impacted by delay in return to therapy after eval. Trial dry needling today as noted per flowsheet with good tolerance-expect may take 1-2 days to note results. Progress with therapy goals ongoing at this point.  Personal Factors and Comorbidities Comorbidity 3+;Time since onset of injury/illness/exacerbation;Past/Current Experience    Comorbidities Arthritis, DM, Glaucoma, heart murmur, HTN, HLD, stroke, cataracts    Examination-Activity Limitations Bend;Locomotion Level;Reach Overhead;Lift    Examination-Participation Restrictions Community Activity;Interpersonal Relationship;Cleaning;Laundry;Shop    Stability/Clinical Decision Making Evolving/Moderate complexity    Clinical Decision Making Moderate    Rehab Potential Fair    PT Frequency --   1-2x/week   PT Duration 8 weeks    PT Treatment/Interventions ADLs/Self Care Home Management;Cryotherapy;Electrical Stimulation;Iontophoresis 4mg /ml Dexamethasone;Moist Heat;Gait training;Stair training;Functional mobility training;Therapeutic activities;Therapeutic exercise;Balance training;Neuromuscular  re-education;Patient/family education;Passive range of motion;Dry needling;Spinal Manipulations    PT Next Visit Plan Check response to dry needling and continue as found beneficial, continue manual, progress exercises and stretches as tolerated    PT Home Exercise Plan [id number]    Consulted and Agree with Plan of Care Patient           Patient will benefit from skilled therapeutic intervention in order to improve the following deficits and impairments:  Difficulty walking,Decreased balance,Decreased endurance,Decreased mobility,Postural dysfunction,Pain,Impaired flexibility,Increased fascial restricitons,Decreased strength,Decreased activity tolerance,Decreased range of motion,Impaired perceived functional ability,Improper body mechanics  Visit Diagnosis: Strain of neck muscle, subsequent encounter  Thoracic myofascial strain, subsequent encounter  Poor posture  Difficulty in walking, not elsewhere classified     Problem List Patient Active Problem List   Diagnosis Date Noted  . Ductal carcinoma in situ (DCIS) of left breast 01/06/2020  . Right-sided thoracic back pain 07/01/2019  . Hyperlipidemia 07/17/2017  . History of lumbar spinal fusion   . History of fusion of lumbar spine   . Anemia due to blood loss 11/02/2016    Class: Acute  . Lethargy 10/31/2016  . AKI (acute kidney injury) ([place]) 10/31/2016  . Chronic diastolic CHF (congestive heart failure) ([place]) 10/31/2016  . Cough   . Leukocytosis   . Spondylolisthesis, lumbar region 10/30/2016    Class: Chronic  . Spinal stenosis of lumbar region 10/30/2016    Class: Chronic  . Retained orthopedic hardware 10/30/2016    Class: Chronic  . Spinal stenosis of lumbar region with neurogenic claudication 10/30/2016  . Genetic testing 09/14/2015  . Atypical ductal hyperplasia of left breast 08/24/2015  . Family history of breast cancer   . Family history of prostate cancer   . Family history of stomach cancer   . Abnormal  nuclear stress test 12/29/2014  . Cervical spondylosis with myelopathy and radiculopathy 06/11/2014  . Carpal tunnel syndrome 05/14/2014  . Aftercare following surgery of the circulatory system, [place] 03/11/2014  . Pain of right lower extremity 03/11/2014  . Atypical lobular hyperplasia of left breast 02/12/2014  . Fibromyalgia 09/21/2013  . Carotid artery disease ([place]) 02/29/2012  . Back abscess 01/22/2012  . Chest pain with moderate risk of acute coronary syndrome 12/09/2009  . Shortness of breath 06/10/2009  . Orthostatic hypotension 01/31/2009  . CAD (coronary artery disease) 12/23/2008  . Normocytic anemia 07/21/2008  . Headache(784.0) 07/21/2008  . History of TIA (transient ischemic attack) 07/21/2008  . Edema 02/27/2008  . Diabetes mellitus with complication ([place]) 52/77/8242  . Depression 07/16/2007  . Dyslipidemia 02/21/2007  . Essential hypertension 02/21/2007  . Asthma 02/21/2007  . GERD 02/21/2007  . LOW BACK PAIN 02/21/2007    Beaulah Dinning, PT, DPT 09/01/20 10:32 AM  [facility] [facility] [address] [place], [name], [address] Phone: [phone]   Fax:  [phone]  Name: Kimberlin Scheel MRN: [id number] Date of Birth: July 26, 1945

## 2020-09-02 ENCOUNTER — Other Ambulatory Visit: Payer: Medicare Other

## 2020-09-05 DIAGNOSIS — M4317 Spondylolisthesis, lumbosacral region: Secondary | ICD-10-CM | POA: Diagnosis not present

## 2020-09-05 DIAGNOSIS — M545 Low back pain, unspecified: Secondary | ICD-10-CM | POA: Diagnosis not present

## 2020-09-05 DIAGNOSIS — Z981 Arthrodesis status: Secondary | ICD-10-CM | POA: Diagnosis not present

## 2020-09-05 DIAGNOSIS — G8929 Other chronic pain: Secondary | ICD-10-CM | POA: Diagnosis not present

## 2020-09-05 DIAGNOSIS — M549 Dorsalgia, unspecified: Secondary | ICD-10-CM | POA: Diagnosis not present

## 2020-09-05 DIAGNOSIS — M533 Sacrococcygeal disorders, not elsewhere classified: Secondary | ICD-10-CM | POA: Diagnosis not present

## 2020-09-05 DIAGNOSIS — M4326 Fusion of spine, lumbar region: Secondary | ICD-10-CM | POA: Diagnosis not present

## 2020-09-05 DIAGNOSIS — M40204 Unspecified kyphosis, thoracic region: Secondary | ICD-10-CM | POA: Diagnosis not present

## 2020-09-06 ENCOUNTER — Other Ambulatory Visit: Payer: Self-pay

## 2020-09-06 ENCOUNTER — Ambulatory Visit: Payer: Medicare Other | Attending: Family Medicine | Admitting: Physical Therapy

## 2020-09-06 ENCOUNTER — Encounter: Payer: Self-pay | Admitting: Physical Therapy

## 2020-09-06 DIAGNOSIS — R293 Abnormal posture: Secondary | ICD-10-CM

## 2020-09-06 DIAGNOSIS — R262 Difficulty in walking, not elsewhere classified: Secondary | ICD-10-CM

## 2020-09-06 DIAGNOSIS — S161XXD Strain of muscle, fascia and tendon at neck level, subsequent encounter: Secondary | ICD-10-CM

## 2020-09-06 DIAGNOSIS — Y939 Activity, unspecified: Secondary | ICD-10-CM | POA: Diagnosis not present

## 2020-09-06 DIAGNOSIS — S29019D Strain of muscle and tendon of unspecified wall of thorax, subsequent encounter: Secondary | ICD-10-CM | POA: Diagnosis not present

## 2020-09-06 DIAGNOSIS — Y929 Unspecified place or not applicable: Secondary | ICD-10-CM | POA: Diagnosis not present

## 2020-09-06 NOTE — Therapy (Signed)
[place], [name], [address] Phone: [phone]   Fax:  [phone]  Physical Therapy Treatment  Patient Details  Name: [name] MRN: [id number] Date of Birth: 12/26/1945 Referring Provider (PT): Laurey Morale, MD   Encounter Date: 09/06/2020   PT End of Session - 09/06/20 1113    Visit Number 3    Number of Visits 16    Date for PT Re-Evaluation 10/07/20    Authorization Type Medicare    Authorization Time Period FOTO visit #6, #10    Progress Note Due on Visit 10    PT Start Time 1106    PT Stop Time 1155   time spent for dry needling and MHP not included in timed minutes for tx.   PT Time Calculation (min) 49 min    Activity Tolerance Patient tolerated treatment well    Behavior During Therapy WFL for tasks assessed/performed           Past Medical History:  Diagnosis Date  . Abnormal nuclear stress test 12/29/2014  . Allergy   . Anemia, unspecified   . Arthritis   . Blood transfusion without reported diagnosis    2018  . CAD (coronary artery disease)    sees. Dr. Burt Knack. s/p cath with PCI of RCA (xience des) June 2010. Pt also with LAD and D2 dzs-NL FFR in both areas med Rx  . Carotid artery occlusion   . Cataract    sees Dr. Herbert Deaner   . Depressive disorder, not elsewhere classified   . Duodenal nodule   . Edema   . Fibromyalgia   . Gastric polyps    COLON  . GERD (gastroesophageal reflux disease)   . Glaucoma    narrow angle, sees Dr. Herbert Deaner   . Headache(784.0)   . Hearing loss    left ear  . Heart murmur    questionable  . Hemorrhoids   . HSV (herpes simplex virus) anogenital infection 05/2014  . HTN (hypertension)   . Hyperlipidemia   . Low back pain    she sees Dr. Louanne Skye, also Dr. Ernestina Patches for pain medications and Benjiman Core PA for injections   . Obesity   . Osteopenia 01/2018   T score -1.6 FRAX 7.1% / 1.4%  . Peripheral vascular disease ([place])   . Stroke  [facility])    TIA  "many yrs ago"    . TIA (transient ischemic attack)    also Hx of it.   . Type II or unspecified type diabetes mellitus without mention of complication, not stated as uncontrolled    sees Dr. Carrolyn Meiers     Past Surgical History:  Procedure Laterality Date  . ABDOMINAL HYSTERECTOMY  age 89   TAH and USO  Leiomyomata  . ANTERIOR CERVICAL CORPECTOMY N/A 06/11/2014   Procedure: ANTERIOR CERVICAL CORPECTOMY CERVICAL SIX; WITH CERVICAL FIVE TO CERVICAL SEVEN ARTHRODESIS;  Surgeon: Ashok Pall, MD;  Location: [place] [facility];  Service: Neurosurgery;  Laterality: N/A;  . BACK SURGERY  2008   lumb fusion  . BREAST BIOPSY Left 01/26/2014   Procedure: EXCISION LEFT BREAST MASS;  Surgeon: Adin Hector, MD;  Location: [place];  Service: General;  Laterality: Left;  . BREAST LUMPECTOMY WITH RADIOACTIVE SEED LOCALIZATION Left 07/24/2017   Procedure: LEFT BREAST LUMPECTOMY WITH RADIOACTIVE SEED LOCALIZATION ERAS;  Surgeon: Erroll Luna, MD;  Location: [place];  Service: General;  Laterality: Left;  . BREAST LUMPECTOMY WITH  RADIOACTIVE SEED LOCALIZATION Left 01/13/2020   Procedure: LEFT BREAST LUMPECTOMY WITH RADIOACTIVE SEED LOCALIZATION;  Surgeon: Erroll Luna, MD;  Location: [place];  Service: General;  Laterality: Left;  . CARDIAC CATHETERIZATION    . CARDIAC CATHETERIZATION N/A 12/29/2014   Procedure: Left Heart Cath and Coronary Angiography;  Surgeon: Sherren Mocha, MD;  Location: [place] [facility];  Service: Cardiovascular;  Laterality: N/A;  . CAROTID ENDARTERECTOMY  march 2012   per Dr. Morton Amy  . CARPAL TUNNEL RELEASE Left 05/14/2014   Procedure: Left Carpal tunnel release;  Surgeon: Ashok Pall, MD;  Location: [place] [facility];  Service: Neurosurgery;  Laterality: Left;  Left Carpal tunnel release  . CARPAL TUNNEL RELEASE Bilateral   . CHOLECYSTECTOMY    . COLONOSCOPY  06/26/2017   per Dr. Henrene Pastor, adenomatous  polyp, repeat in 5 yrs    . CORONARY STENT PLACEMENT     Dr. Burt Knack  . DILATION AND CURETTAGE OF UTERUS    . ESOPHAGOGASTRODUODENOSCOPY  02-08-06   per Dr. Henrene Pastor, gastritis   . EYE SURGERY Left June 2016   Cataract  . GLAUCOMA SURGERY     with laser, per Dr. Henrene Pastor   . HARDWARE REMOVAL  10/30/2016   Procedure: HARDWARE REMOVAL;  Surgeon: Jessy Oto, MD;  Location: [facility] [place];  Service: Orthopedics;;  . LUMBAR FUSION  2008  . POLYPECTOMY    . POSTERIOR LUMBAR FUSION  10/30/2016   Removal of hardware rods and pedicle screws lumbar four, Extension of fusion L4-5 to L5-S1 with reinsertion of screws at L 4, left L5-S1 transforaminal lumbar interbody fusion, Exploration  left L5 nerve root, bilateral decompressive laminectomy L3-4/notes 10/30/2016  . ULNAR NERVE TRANSPOSITION Left 05/14/2014   Procedure: Left Ulnar nerve decompression;  Surgeon: Ashok Pall, MD;  Location: [place] [facility];  Service: Neurosurgery;  Laterality: Left;  Left Ulnar nerve decompression    There were no vitals filed for this visit.   Subjective Assessment - 09/06/20 1108    Subjective Pt. reports sore after dry needling for about 2 days but then noted benefit for upper trapezius region also a little better in thoraicic region.. Pain more in right scapular region today with pain 5/10. Pt. saw spine specialist yesterday and reports injection is scheduled for later this month (SI region).                             [facility] Adult PT Treatment/Exercise - 09/06/20 0001      Neck Exercises: Theraband   Shoulder Extension 15 reps    Shoulder Extension Limitations yellow-seated    Rows 15 reps    Rows Limitations yellow band-seated    Shoulder External Rotation 15 reps    Shoulder External Rotation Limitations yellow-seated   bilat. UE     Neck Exercises: Seated   Neck Retraction 15 reps    Neck Retraction Limitations with therapist assistance      Neck Exercises: Sidelying   Other Sidelying Exercise right  shoulder ER x 15 reps      Modalities   Modalities Moist Heat      Moist Heat Therapy   Number Minutes Moist Heat 10 Minutes    Moist Heat Location --   right upper trapezius and scapular region in sitting     Manual Therapy   Soft tissue mobilization STM right posterior scapular region and thoracic paraspinals in left sidelying      Neck Exercises:  Stretches   Upper Trapezius Stretch Right;3 reps;30 seconds    Upper Trapezius Stretch Limitations gentle supine manual stretch    Levator Stretch Right;3 reps;30 seconds   gentle supine manual stretch                   PT Short Term Goals - 08/12/20 1125      PT SHORT TERM GOAL #1   Title Pt will be independent and compliant with initial HEP.    Time 2    Period Weeks    Status New    Target Date 08/26/20             PT Long Term Goals - 08/12/20 1126      PT LONG TERM GOAL #1   Title Pt will be independent and compliant with long term HEP for maintenance.    Time 8    Period Weeks    Status New    Target Date 10/07/20      PT LONG TERM GOAL #2   Title Pt will report decrease in neck/thoracic pain to </=3/10 on a regular basis for improved tolerance to daily activity.    Baseline 5/10 neck, 7/10 thoracic    Status New      PT LONG TERM GOAL #3   Title Pt will increase lumbar FOTO ability to 58% and neck to 62%, in order to demonstrate improvement in perceived level of functional ability.    Baseline 39% lumbar, 52% neck    Time 8    Period Weeks    Status New    Target Date 10/07/20      PT LONG TERM GOAL #4   Title Pt will lift 15# from floor to waist and waist to OH pain free for improved functional lifting ability.    Baseline pain with any    Time 8    Period Weeks    Status New    Target Date 10/07/20      PT LONG TERM GOAL #5   Title Pt will ambulate 1644 ft for 6MWT, per age related norm, with LRAD.    Baseline needs to be completed    Time 8    Period Weeks    Status New    Target  Date 10/07/20                 Plan - 09/06/20 1252    Clinical Impression Statement Good response to initial trial dry needling last session so continued again today with more emphasis scapular region given symptom location. Also able to progress today with light postural/periscapular strengthening with good tolerance. Status complicated by surgical and chronic pain history but so far noting benefit with tx. so plan continue PT for further progress to help relieve pain and address associated functional limitations.    Personal Factors and Comorbidities Comorbidity 3+;Time since onset of injury/illness/exacerbation;Past/Current Experience    Comorbidities Arthritis, DM, Glaucoma, heart murmur, HTN, HLD, stroke, cataracts    Examination-Activity Limitations Bend;Locomotion Level;Reach Overhead;Lift    Examination-Participation Restrictions Community Activity;Interpersonal Relationship;Cleaning;Laundry;Shop    Stability/Clinical Decision Making Evolving/Moderate complexity    Clinical Decision Making Moderate    Rehab Potential Fair    PT Frequency --   1-2x/week   PT Duration 8 weeks    PT Treatment/Interventions ADLs/Self Care Home Management;Cryotherapy;Electrical Stimulation;Iontophoresis 4mg /ml Dexamethasone;Moist Heat;Gait training;Stair training;Functional mobility training;Therapeutic activities;Therapeutic exercise;Balance training;Neuromuscular re-education;Patient/family education;Passive range of motion;Dry needling;Spinal Manipulations    PT Next Visit Plan continue postural exercises/progress periscapular  strengthening and neck+ thoracic ROM, gentle stretches, further dry needling as needed, further manual therpay for STM as needed    PT Home Exercise Plan [id number]    Consulted and Agree with Plan of Care Patient           Patient will benefit from skilled therapeutic intervention in order to improve the following deficits and impairments:  Difficulty walking,Decreased  balance,Decreased endurance,Decreased mobility,Postural dysfunction,Pain,Impaired flexibility,Increased fascial restricitons,Decreased strength,Decreased activity tolerance,Decreased range of motion,Impaired perceived functional ability,Improper body mechanics  Visit Diagnosis: Strain of neck muscle, subsequent encounter  Thoracic myofascial strain, subsequent encounter  Poor posture  Difficulty in walking, not elsewhere classified     Problem List Patient Active Problem List   Diagnosis Date Noted  . Ductal carcinoma in situ (DCIS) of left breast 01/06/2020  . Right-sided thoracic back pain 07/01/2019  . Hyperlipidemia 07/17/2017  . History of lumbar spinal fusion   . History of fusion of lumbar spine   . Anemia due to blood loss 11/02/2016    Class: Acute  . Lethargy 10/31/2016  . AKI (acute kidney injury) ([place]) 10/31/2016  . Chronic diastolic CHF (congestive heart failure) ([place]) 10/31/2016  . Cough   . Leukocytosis   . Spondylolisthesis, lumbar region 10/30/2016    Class: Chronic  . Spinal stenosis of lumbar region 10/30/2016    Class: Chronic  . Retained orthopedic hardware 10/30/2016    Class: Chronic  . Spinal stenosis of lumbar region with neurogenic claudication 10/30/2016  . Genetic testing 09/14/2015  . Atypical ductal hyperplasia of left breast 08/24/2015  . Family history of breast cancer   . Family history of prostate cancer   . Family history of stomach cancer   . Abnormal nuclear stress test 12/29/2014  . Cervical spondylosis with myelopathy and radiculopathy 06/11/2014  . Carpal tunnel syndrome 05/14/2014  . Aftercare following surgery of the circulatory system, [place] 03/11/2014  . Pain of right lower extremity 03/11/2014  . Atypical lobular hyperplasia of left breast 02/12/2014  . Fibromyalgia 09/21/2013  . Carotid artery disease ([place]) 02/29/2012  . Back abscess 01/22/2012  . Chest pain with moderate risk of acute coronary syndrome 12/09/2009  .  Shortness of breath 06/10/2009  . Orthostatic hypotension 01/31/2009  . CAD (coronary artery disease) 12/23/2008  . Normocytic anemia 07/21/2008  . Headache(784.0) 07/21/2008  . History of TIA (transient ischemic attack) 07/21/2008  . Edema 02/27/2008  . Diabetes mellitus with complication ([place]) 16/04/9603  . Depression 07/16/2007  . Dyslipidemia 02/21/2007  . Essential hypertension 02/21/2007  . Asthma 02/21/2007  . GERD 02/21/2007  . LOW BACK PAIN 02/21/2007    Beaulah Dinning, PT, DPT 09/06/20 12:57 PM  [facility] [id number] [place], [name], [address] Phone: [phone]   Fax:  [phone]  Name: Shellby Schlink MRN: [id number] Date of Birth: [birth year]

## 2020-09-08 ENCOUNTER — Ambulatory Visit: Payer: Medicare Other

## 2020-09-12 ENCOUNTER — Other Ambulatory Visit: Payer: Self-pay | Admitting: Family Medicine

## 2020-09-12 ENCOUNTER — Other Ambulatory Visit: Payer: Self-pay | Admitting: Cardiovascular Disease

## 2020-09-13 ENCOUNTER — Other Ambulatory Visit: Payer: Self-pay

## 2020-09-13 ENCOUNTER — Ambulatory Visit: Payer: Medicare Other

## 2020-09-13 DIAGNOSIS — R293 Abnormal posture: Secondary | ICD-10-CM | POA: Diagnosis not present

## 2020-09-13 DIAGNOSIS — E1159 Type 2 diabetes mellitus with other circulatory complications: Secondary | ICD-10-CM | POA: Diagnosis not present

## 2020-09-13 DIAGNOSIS — S29019D Strain of muscle and tendon of unspecified wall of thorax, subsequent encounter: Secondary | ICD-10-CM | POA: Diagnosis not present

## 2020-09-13 DIAGNOSIS — R262 Difficulty in walking, not elsewhere classified: Secondary | ICD-10-CM | POA: Diagnosis not present

## 2020-09-13 DIAGNOSIS — E119 Type 2 diabetes mellitus without complications: Secondary | ICD-10-CM | POA: Diagnosis not present

## 2020-09-13 DIAGNOSIS — S161XXD Strain of muscle, fascia and tendon at neck level, subsequent encounter: Secondary | ICD-10-CM | POA: Diagnosis not present

## 2020-09-13 DIAGNOSIS — E785 Hyperlipidemia, unspecified: Secondary | ICD-10-CM | POA: Diagnosis not present

## 2020-09-13 NOTE — Therapy (Signed)
[place], [name], [address] Phone: [phone]   Fax:  [phone]  Physical Therapy Treatment  Patient Details  Name: [name] MRN: [id number] Date of Birth: [birth year] Referring Provider (PT): Laurey Morale, MD   Encounter Date: 09/13/2020   PT End of Session - 09/13/20 1037    Visit Number 4    Number of Visits 16    Date for PT Re-Evaluation 10/07/20    Authorization Type Medicare    Authorization Time Period FOTO visit #6, #10    Progress Note Due on Visit 10    PT Start Time 1026   pt arrived late   PT Stop Time 1100    PT Time Calculation (min) 34 min    Activity Tolerance Patient tolerated treatment well    Behavior During Therapy [facility] for tasks assessed/performed           Past Medical History:  Diagnosis Date  . Abnormal nuclear stress test 12/29/2014  . Allergy   . Anemia, unspecified   . Arthritis   . Blood transfusion without reported diagnosis    2018  . CAD (coronary artery disease)    sees. Dr. Burt Knack. s/p cath with PCI of RCA (xience des) June 2010. Pt also with LAD and D2 dzs-NL FFR in both areas med Rx  . Carotid artery occlusion   . Cataract    sees Dr. Herbert Deaner   . Depressive disorder, not elsewhere classified   . Duodenal nodule   . Edema   . Fibromyalgia   . Gastric polyps    COLON  . GERD (gastroesophageal reflux disease)   . Glaucoma    narrow angle, sees Dr. Herbert Deaner   . Headache(784.0)   . Hearing loss    left ear  . Heart murmur    questionable  . Hemorrhoids   . HSV (herpes simplex virus) anogenital infection 05/2014  . HTN (hypertension)   . Hyperlipidemia   . Low back pain    she sees Dr. Louanne Skye, also Dr. Ernestina Patches for pain medications and Benjiman Core PA for injections   . Obesity   . Osteopenia 01/2018   T score -1.6 FRAX 7.1% / 1.4%  . Peripheral vascular disease ([place])   . Stroke [facility])    TIA  "many yrs ago"    . TIA (transient ischemic  attack)    also Hx of it.   . Type II or unspecified type diabetes mellitus without mention of complication, not stated as uncontrolled    sees Dr. Carrolyn Meiers     Past Surgical History:  Procedure Laterality Date  . ABDOMINAL HYSTERECTOMY  age 44   TAH and USO  Leiomyomata  . ANTERIOR CERVICAL CORPECTOMY N/A 06/11/2014   Procedure: ANTERIOR CERVICAL CORPECTOMY CERVICAL SIX; WITH CERVICAL FIVE TO CERVICAL SEVEN ARTHRODESIS;  Surgeon: Ashok Pall, MD;  Location: [place] [facility];  Service: Neurosurgery;  Laterality: N/A;  . BACK SURGERY  2008   lumb fusion  . BREAST BIOPSY Left 01/26/2014   Procedure: EXCISION LEFT BREAST MASS;  Surgeon: Adin Hector, MD;  Location: [place];  Service: General;  Laterality: Left;  . BREAST LUMPECTOMY WITH RADIOACTIVE SEED LOCALIZATION Left 07/24/2017   Procedure: LEFT BREAST LUMPECTOMY WITH RADIOACTIVE SEED LOCALIZATION ERAS;  Surgeon: Erroll Luna, MD;  Location: [place];  Service: General;  Laterality: Left;  . BREAST LUMPECTOMY WITH RADIOACTIVE SEED LOCALIZATION Left 01/13/2020   Procedure: LEFT BREAST LUMPECTOMY  WITH RADIOACTIVE SEED LOCALIZATION;  Surgeon: Erroll Luna, MD;  Location: [place];  Service: General;  Laterality: Left;  . CARDIAC CATHETERIZATION    . CARDIAC CATHETERIZATION N/A 12/29/2014   Procedure: Left Heart Cath and Coronary Angiography;  Surgeon: Sherren Mocha, MD;  Location: [place] CV [facility];  Service: Cardiovascular;  Laterality: N/A;  . CAROTID ENDARTERECTOMY  march 2012   per Dr. Morton Amy  . CARPAL TUNNEL RELEASE Left 05/14/2014   Procedure: Left Carpal tunnel release;  Surgeon: Ashok Pall, MD;  Location: [place] [facility];  Service: Neurosurgery;  Laterality: Left;  Left Carpal tunnel release  . CARPAL TUNNEL RELEASE Bilateral   . CHOLECYSTECTOMY    . COLONOSCOPY  06/26/2017   per Dr. Henrene Pastor, adenomatous polyp, repeat in 5 yrs    . CORONARY STENT PLACEMENT     Dr.  Burt Knack  . DILATION AND CURETTAGE OF UTERUS    . ESOPHAGOGASTRODUODENOSCOPY  02-08-06   per Dr. Henrene Pastor, gastritis   . EYE SURGERY Left June 2016   Cataract  . GLAUCOMA SURGERY     with laser, per Dr. Henrene Pastor   . HARDWARE REMOVAL  10/30/2016   Procedure: HARDWARE REMOVAL;  Surgeon: Jessy Oto, MD;  Location: [place];  Service: Orthopedics;;  . LUMBAR FUSION  2008  . POLYPECTOMY    . POSTERIOR LUMBAR FUSION  10/30/2016   Removal of hardware rods and pedicle screws lumbar four, Extension of fusion L4-5 to L5-S1 with reinsertion of screws at L 4, left L5-S1 transforaminal lumbar interbody fusion, Exploration  left L5 nerve root, bilateral decompressive laminectomy L3-4/notes 10/30/2016  . ULNAR NERVE TRANSPOSITION Left 05/14/2014   Procedure: Left Ulnar nerve decompression;  Surgeon: Ashok Pall, MD;  Location: [place] [facility];  Service: Neurosurgery;  Laterality: Left;  Left Ulnar nerve decompression    There were no vitals filed for this visit.   Subjective Assessment - 09/13/20 1034    Subjective Pt reports she feels ok today, not really good this morning, but a little better now. She put a pain patch on her right hip (salonpas). Sh e tried putting heat on her neck because she's so sore.    Currently in Pain? Yes    Pain Score --   4-5/10   Pain Location Neck    Pain Orientation Right    Pain Descriptors / Indicators Dull;Aching    Pain Type Chronic pain    Pain Radiating Towards down to right shoulder like a capital L                             [facility] Adult PT Treatment/Exercise - 09/13/20 0001      Self-Care   Self-Care Posture    Posture reviewed sitting/standing posture focused on cervical and shoulders, edu for why it matters and new exercise, provided red band for home      Neck Exercises: Supine   Shoulder ABduction 10 reps    Shoulder Abduction Limitations 2 sets red band   provided for home     Manual Therapy   Manual Therapy Soft tissue mobilization;Passive  ROM    Soft tissue mobilization STM R C/S PS, UT, LS    Passive ROM passively retracted and depressed shoulders for passive UT/LE and pec lengthening                  PT Education - 09/13/20 1105    Education Details HEP, posture  Person(s) Educated Patient    Methods Explanation;Demonstration;Verbal cues;Tactile cues;Handout    Comprehension Verbalized understanding;Returned demonstration;Verbal cues required;Tactile cues required            PT Short Term Goals - 08/12/20 1125      PT SHORT TERM GOAL #1   Title Pt will be independent and compliant with initial HEP.    Time 2    Period Weeks    Status New    Target Date 08/26/20             PT Long Term Goals - 08/12/20 1126      PT LONG TERM GOAL #1   Title Pt will be independent and compliant with long term HEP for maintenance.    Time 8    Period Weeks    Status New    Target Date 10/07/20      PT LONG TERM GOAL #2   Title Pt will report decrease in neck/thoracic pain to </=3/10 on a regular basis for improved tolerance to daily activity.    Baseline 5/10 neck, 7/10 thoracic    Status New      PT LONG TERM GOAL #3   Title Pt will increase lumbar FOTO ability to 58% and neck to 62%, in order to demonstrate improvement in perceived level of functional ability.    Baseline 39% lumbar, 52% neck    Time 8    Period Weeks    Status New    Target Date 10/07/20      PT LONG TERM GOAL #4   Title Pt will lift 15# from floor to waist and waist to OH pain free for improved functional lifting ability.    Baseline pain with any    Time 8    Period Weeks    Status New    Target Date 10/07/20      PT LONG TERM GOAL #5   Title Pt will ambulate 1644 ft for 6MWT, per age related norm, with LRAD.    Baseline needs to be completed    Time 8    Period Weeks    Status New    Target Date 10/07/20                 Plan - 09/13/20 1105    Clinical Impression Statement Pt presents with mild decrease in  neck pain, but continued right-sided symptoms. She tolerated manual therapy well today, but did request not to "have her neck pulled on like last time". Addressed posture today, concentrating on opening chest and gently strengthening periscapular muscles in hooklying. Pt reported feeling like she was walking better post session.    Personal Factors and Comorbidities Comorbidity 3+;Time since onset of injury/illness/exacerbation;Past/Current Experience    Comorbidities Arthritis, DM, Glaucoma, heart murmur, HTN, HLD, stroke, cataracts    Examination-Activity Limitations Bend;Locomotion Level;Reach Overhead;Lift    Examination-Participation Restrictions Community Activity;Interpersonal Relationship;Cleaning;Laundry;Shop    Stability/Clinical Decision Making Evolving/Moderate complexity    Rehab Potential Fair    PT Frequency --   1-2x/week   PT Duration 8 weeks    PT Treatment/Interventions ADLs/Self Care Home Management;Cryotherapy;Electrical Stimulation;Iontophoresis 4mg /ml Dexamethasone;Moist Heat;Gait training;Stair training;Functional mobility training;Therapeutic activities;Therapeutic exercise;Balance training;Neuromuscular re-education;Patient/family education;Passive range of motion;Dry needling;Spinal Manipulations    PT Next Visit Plan continue postural exercises/progress periscapular strengthening and neck+ thoracic ROM, gentle stretches, further dry needling as needed, further manual therpay for STM as needed    PT Home Exercise Plan [id number]    Consulted and Agree with Plan of  Care Patient           Patient will benefit from skilled therapeutic intervention in order to improve the following deficits and impairments:  Difficulty walking,Decreased balance,Decreased endurance,Decreased mobility,Postural dysfunction,Pain,Impaired flexibility,Increased fascial restricitons,Decreased strength,Decreased activity tolerance,Decreased range of motion,Impaired perceived functional ability,Improper  body mechanics  Visit Diagnosis: Strain of neck muscle, subsequent encounter  Thoracic myofascial strain, subsequent encounter  Poor posture  Difficulty in walking, not elsewhere classified     Problem List Patient Active Problem List   Diagnosis Date Noted  . Ductal carcinoma in situ (DCIS) of left breast 01/06/2020  . Right-sided thoracic back pain 07/01/2019  . Hyperlipidemia 07/17/2017  . History of lumbar spinal fusion   . History of fusion of lumbar spine   . Anemia due to blood loss 11/02/2016    Class: Acute  . Lethargy 10/31/2016  . AKI (acute kidney injury) ([place]) 10/31/2016  . Chronic diastolic CHF (congestive heart failure) ([place]) 10/31/2016  . Cough   . Leukocytosis   . Spondylolisthesis, lumbar region 10/30/2016    Class: Chronic  . Spinal stenosis of lumbar region 10/30/2016    Class: Chronic  . Retained orthopedic hardware 10/30/2016    Class: Chronic  . Spinal stenosis of lumbar region with neurogenic claudication 10/30/2016  . Genetic testing 09/14/2015  . Atypical ductal hyperplasia of left breast 08/24/2015  . Family history of breast cancer   . Family history of prostate cancer   . Family history of stomach cancer   . Abnormal nuclear stress test 12/29/2014  . Cervical spondylosis with myelopathy and radiculopathy 06/11/2014  . Carpal tunnel syndrome 05/14/2014  . Aftercare following surgery of the circulatory system, [place] 03/11/2014  . Pain of right lower extremity 03/11/2014  . Atypical lobular hyperplasia of left breast 02/12/2014  . Fibromyalgia 09/21/2013  . Carotid artery disease ([place]) 02/29/2012  . Back abscess 01/22/2012  . Chest pain with moderate risk of acute coronary syndrome 12/09/2009  . Shortness of breath 06/10/2009  . Orthostatic hypotension 01/31/2009  . CAD (coronary artery disease) 12/23/2008  . Normocytic anemia 07/21/2008  . Headache(784.0) 07/21/2008  . History of TIA (transient ischemic attack) 07/21/2008  . Edema  02/27/2008  . Diabetes mellitus with complication ([place]) 53/61/4431  . Depression 07/16/2007  . Dyslipidemia 02/21/2007  . Essential hypertension 02/21/2007  . Asthma 02/21/2007  . GERD 02/21/2007  . LOW BACK PAIN 02/21/2007    Izell Carlisle, PT, DPT 09/13/2020, 11:08 AM  [facility] [address] [place], [name], [address] Phone: [phone]   Fax:  [phone]  Name: Embrie Mikkelsen MRN: [id number] Date of Birth: Mar 16, 1946

## 2020-09-15 ENCOUNTER — Other Ambulatory Visit: Payer: Self-pay

## 2020-09-15 ENCOUNTER — Ambulatory Visit: Payer: Medicare Other

## 2020-09-15 DIAGNOSIS — R293 Abnormal posture: Secondary | ICD-10-CM | POA: Diagnosis not present

## 2020-09-15 DIAGNOSIS — R262 Difficulty in walking, not elsewhere classified: Secondary | ICD-10-CM | POA: Diagnosis not present

## 2020-09-15 DIAGNOSIS — S29019D Strain of muscle and tendon of unspecified wall of thorax, subsequent encounter: Secondary | ICD-10-CM

## 2020-09-15 DIAGNOSIS — S161XXD Strain of muscle, fascia and tendon at neck level, subsequent encounter: Secondary | ICD-10-CM

## 2020-09-15 NOTE — Therapy (Signed)
[place], [name], [address] Phone: [phone]   Fax:  [phone]  Physical Therapy Treatment  Patient Details  Name: Kamoni Depree MRN: [id number] Date of Birth: [birth year] Referring Provider (PT): Laurey Morale, MD   Encounter Date: 09/15/2020   PT End of Session - 09/15/20 1057    Visit Number 5    Number of Visits 16    Date for PT Re-Evaluation 10/07/20    Authorization Type Medicare    Authorization Time Period FOTO visit #6, #10    Progress Note Due on Visit 10    PT Start Time 1010    PT Stop Time 1105    PT Time Calculation (min) 55 min    Activity Tolerance Patient tolerated treatment well    Behavior During Therapy [facility] for tasks assessed/performed           Past Medical History:  Diagnosis Date  . Abnormal nuclear stress test 12/29/2014  . Allergy   . Anemia, unspecified   . Arthritis   . Blood transfusion without reported diagnosis    2018  . CAD (coronary artery disease)    sees. Dr. Burt Knack. s/p cath with PCI of RCA (xience des) June 2010. Pt also with LAD and D2 dzs-NL FFR in both areas med Rx  . Carotid artery occlusion   . Cataract    sees Dr. Herbert Deaner   . Depressive disorder, not elsewhere classified   . Duodenal nodule   . Edema   . Fibromyalgia   . Gastric polyps    COLON  . GERD (gastroesophageal reflux disease)   . Glaucoma    narrow angle, sees Dr. Herbert Deaner   . Headache(784.0)   . Hearing loss    left ear  . Heart murmur    questionable  . Hemorrhoids   . HSV (herpes simplex virus) anogenital infection 05/2014  . HTN (hypertension)   . Hyperlipidemia   . Low back pain    she sees Dr. Louanne Skye, also Dr. Ernestina Patches for pain medications and Benjiman Core PA for injections   . Obesity   . Osteopenia 01/2018   T score -1.6 FRAX 7.1% / 1.4%  . Peripheral vascular disease ([place])   . Stroke [facility])    TIA  "many yrs ago"    . TIA (transient ischemic attack)    also  Hx of it.   . Type II or unspecified type diabetes mellitus without mention of complication, not stated as uncontrolled    sees Dr. Carrolyn Meiers     Past Surgical History:  Procedure Laterality Date  . ABDOMINAL HYSTERECTOMY  age 41   TAH and USO  Leiomyomata  . ANTERIOR CERVICAL CORPECTOMY N/A 06/11/2014   Procedure: ANTERIOR CERVICAL CORPECTOMY CERVICAL SIX; WITH CERVICAL FIVE TO CERVICAL SEVEN ARTHRODESIS;  Surgeon: Ashok Pall, MD;  Location: [place] [facility];  Service: Neurosurgery;  Laterality: N/A;  . BACK SURGERY  2008   lumb fusion  . BREAST BIOPSY Left 01/26/2014   Procedure: EXCISION LEFT BREAST MASS;  Surgeon: Adin Hector, MD;  Location: [place];  Service: General;  Laterality: Left;  . BREAST LUMPECTOMY WITH RADIOACTIVE SEED LOCALIZATION Left 07/24/2017   Procedure: LEFT BREAST LUMPECTOMY WITH RADIOACTIVE SEED LOCALIZATION ERAS;  Surgeon: Erroll Luna, MD;  Location: [place];  Service: General;  Laterality: Left;  . BREAST LUMPECTOMY WITH RADIOACTIVE SEED LOCALIZATION Left 01/13/2020   Procedure: LEFT BREAST LUMPECTOMY WITH RADIOACTIVE SEED LOCALIZATION;  Surgeon: Erroll Luna, MD;  Location: [place];  Service: General;  Laterality: Left;  . CARDIAC CATHETERIZATION    . CARDIAC CATHETERIZATION N/A 12/29/2014   Procedure: Left Heart Cath and Coronary Angiography;  Surgeon: Sherren Mocha, MD;  Location: [place] CV LAB;  Service: Cardiovascular;  Laterality: N/A;  . CAROTID ENDARTERECTOMY  march 2012   per Dr. Morton Amy  . CARPAL TUNNEL RELEASE Left 05/14/2014   Procedure: Left Carpal tunnel release;  Surgeon: Ashok Pall, MD;  Location: [place] [facility];  Service: Neurosurgery;  Laterality: Left;  Left Carpal tunnel release  . CARPAL TUNNEL RELEASE Bilateral   . CHOLECYSTECTOMY    . COLONOSCOPY  06/26/2017   per Dr. Henrene Pastor, adenomatous polyp, repeat in 5 yrs    . CORONARY STENT PLACEMENT     Dr. Burt Knack  .  DILATION AND CURETTAGE OF UTERUS    . ESOPHAGOGASTRODUODENOSCOPY  02-08-06   per Dr. Henrene Pastor, gastritis   . EYE SURGERY Left June 2016   Cataract  . GLAUCOMA SURGERY     with laser, per Dr. Henrene Pastor   . HARDWARE REMOVAL  10/30/2016   Procedure: HARDWARE REMOVAL;  Surgeon: Jessy Oto, MD;  Location: [place];  Service: Orthopedics;;  . LUMBAR FUSION  2008  . POLYPECTOMY    . POSTERIOR LUMBAR FUSION  10/30/2016   Removal of hardware rods and pedicle screws lumbar four, Extension of fusion L4-5 to L5-S1 with reinsertion of screws at L 4, left L5-S1 transforaminal lumbar interbody fusion, Exploration  left L5 nerve root, bilateral decompressive laminectomy L3-4/notes 10/30/2016  . ULNAR NERVE TRANSPOSITION Left 05/14/2014   Procedure: Left Ulnar nerve decompression;  Surgeon: Ashok Pall, MD;  Location: [place] [facility];  Service: Neurosurgery;  Laterality: Left;  Left Ulnar nerve decompression    There were no vitals filed for this visit.   Subjective Assessment - 09/15/20 1014    Subjective Pt reports her neckis felling a little better    Patient Stated Goals to try to get better than what she is    Currently in Pain? Yes    Pain Score 4     Pain Location Neck    Pain Orientation Right    Pain Descriptors / Indicators Aching;Dull    Pain Type Chronic pain    Pain Radiating Towards down to right shoulder like a capital L    Pain Onset More than a month ago    Pain Frequency Intermittent    Pain Score 5    Pain Location Thoracic    Pain Orientation Mid    Pain Descriptors / Indicators Aching    Pain Type Chronic pain    Pain Onset More than a month ago    Pain Frequency Intermittent                             [facility] Adult PT Treatment/Exercise - 09/15/20 0001      Self-Care   Self-Care Posture    Posture reviewed proper sitting/standing posture focused on cervical and shoulders. To scoot hips to the back of the chair for low back support or extend claps hands behind back  to set a better unsupported sitting position      Neck Exercises: Supine   Neck Retraction 10 reps;3 secs    Cervical Rotation Right;Left;5 reps    Cervical Rotation Limitations 5 sec    Shoulder ABduction 15 reps    Shoulder Abduction Limitations  red  band      Moist Heat Therapy   Number Minutes Moist Heat 15 Minutes    Moist Heat Location Cervical;Lumbar Spine      Neck Exercises: Stretches   Upper Trapezius Stretch Right;3 reps;30 seconds                  PT Education - 09/15/20 1057    Education Details Posture and HEP    Person(s) Educated Patient    Methods Explanation;Demonstration;Tactile cues;Verbal cues;Handout    Comprehension Verbalized understanding;Returned demonstration;Verbal cues required;Tactile cues required;Need further instruction            PT Short Term Goals - 08/12/20 1125      PT SHORT TERM GOAL #1   Title Pt will be independent and compliant with initial HEP.    Time 2    Period Weeks    Status New    Target Date 08/26/20             PT Long Term Goals - 08/12/20 1126      PT LONG TERM GOAL #1   Title Pt will be independent and compliant with long term HEP for maintenance.    Time 8    Period Weeks    Status New    Target Date 10/07/20      PT LONG TERM GOAL #2   Title Pt will report decrease in neck/thoracic pain to </=3/10 on a regular basis for improved tolerance to daily activity.    Baseline 5/10 neck, 7/10 thoracic    Status New      PT LONG TERM GOAL #3   Title Pt will increase lumbar FOTO ability to 58% and neck to 62%, in order to demonstrate improvement in perceived level of functional ability.    Baseline 39% lumbar, 52% neck    Time 8    Period Weeks    Status New    Target Date 10/07/20      PT LONG TERM GOAL #4   Title Pt will lift 15# from floor to waist and waist to OH pain free for improved functional lifting ability.    Baseline pain with any    Time 8    Period Weeks    Status New    Target  Date 10/07/20      PT LONG TERM GOAL #5   Title Pt will ambulate 1644 ft for 6MWT, per age related norm, with LRAD.    Baseline needs to be completed    Time 8    Period Weeks    Status New    Target Date 10/07/20                 Plan - 09/15/20 1058    Clinical Impression Statement Reviwed posture and techniques as to how to achieve better sitting posture. Pt was provided for cervical ROM and posterior chain strengthening with cervical retraction and rotation completed today. These exs were added to the HEP. Pt was able to return demonstration of the postural techniques and ther ex. Moist heat was provided at end of session to address pain.    Personal Factors and Comorbidities Comorbidity 3+;Time since onset of injury/illness/exacerbation;Past/Current Experience    Comorbidities Arthritis, DM, Glaucoma, heart murmur, HTN, HLD, stroke, cataracts    Examination-Activity Limitations Bend;Locomotion Level;Reach Overhead;Lift    Stability/Clinical Decision Making Evolving/Moderate complexity    Clinical Decision Making Moderate    Rehab Potential Fair    PT Frequency --  1-2x/week   PT Duration 8 weeks    PT Treatment/Interventions ADLs/Self Care Home Management;Cryotherapy;Electrical Stimulation;Iontophoresis 4mg /ml Dexamethasone;Moist Heat;Gait training;Stair training;Functional mobility training;Therapeutic activities;Therapeutic exercise;Balance training;Neuromuscular re-education;Patient/family education;Passive range of motion;Dry needling;Spinal Manipulations    PT Next Visit Plan continue postural exercises/progress periscapular strengthening and neck+ thoracic ROM, gentle stretches, further dry needling as needed, further manual therpay for STM as needed    PT Home Exercise Plan [id number]- cervical rettraction and cervical rotation in supine    Consulted and Agree with Plan of Care Patient           Patient will benefit from skilled therapeutic intervention in order to  improve the following deficits and impairments:  Difficulty walking,Decreased balance,Decreased endurance,Decreased mobility,Postural dysfunction,Pain,Impaired flexibility,Increased fascial restricitons,Decreased strength,Decreased activity tolerance,Decreased range of motion,Impaired perceived functional ability,Improper body mechanics  Visit Diagnosis: Strain of neck muscle, subsequent encounter  Thoracic myofascial strain, subsequent encounter  Poor posture  Difficulty in walking, not elsewhere classified     Problem List Patient Active Problem List   Diagnosis Date Noted  . Ductal carcinoma in situ (DCIS) of left breast 01/06/2020  . Right-sided thoracic back pain 07/01/2019  . Hyperlipidemia 07/17/2017  . History of lumbar spinal fusion   . History of fusion of lumbar spine   . Anemia due to blood loss 11/02/2016    Class: Acute  . Lethargy 10/31/2016  . AKI (acute kidney injury) ([place]) 10/31/2016  . Chronic diastolic CHF (congestive heart failure) ([place]) 10/31/2016  . Cough   . Leukocytosis   . Spondylolisthesis, lumbar region 10/30/2016    Class: Chronic  . Spinal stenosis of lumbar region 10/30/2016    Class: Chronic  . Retained orthopedic hardware 10/30/2016    Class: Chronic  . Spinal stenosis of lumbar region with neurogenic claudication 10/30/2016  . Genetic testing 09/14/2015  . Atypical ductal hyperplasia of left breast 08/24/2015  . Family history of breast cancer   . Family history of prostate cancer   . Family history of stomach cancer   . Abnormal nuclear stress test 12/29/2014  . Cervical spondylosis with myelopathy and radiculopathy 06/11/2014  . Carpal tunnel syndrome 05/14/2014  . Aftercare following surgery of the circulatory system, [place] 03/11/2014  . Pain of right lower extremity 03/11/2014  . Atypical lobular hyperplasia of left breast 02/12/2014  . Fibromyalgia 09/21/2013  . Carotid artery disease ([place]) 02/29/2012  . Back abscess 01/22/2012  .  Chest pain with moderate risk of acute coronary syndrome 12/09/2009  . Shortness of breath 06/10/2009  . Orthostatic hypotension 01/31/2009  . CAD (coronary artery disease) 12/23/2008  . Normocytic anemia 07/21/2008  . Headache(784.0) 07/21/2008  . History of TIA (transient ischemic attack) 07/21/2008  . Edema 02/27/2008  . Diabetes mellitus with complication ([place]) 23/53/6144  . Depression 07/16/2007  . Dyslipidemia 02/21/2007  . Essential hypertension 02/21/2007  . Asthma 02/21/2007  . GERD 02/21/2007  . LOW BACK PAIN 02/21/2007   Gar Ponto MS, PT 09/15/20 11:14 AM  [facility] [address] [place], [name], [address] Phone: [phone]   Fax:  [phone]  Name: Izel Hochberg MRN: [id number] Date of Birth: 10-Jun-1946

## 2020-09-16 ENCOUNTER — Ambulatory Visit: Payer: Medicare Other | Admitting: Physician Assistant

## 2020-09-19 NOTE — Progress Notes (Signed)
Cardiology Office Note:    Date:  09/20/2020   ID:  [name], DOB Dec 29, 1945, MRN [id number]  PCP:  Laurey Morale, MD   [place]  Cardiologist:  Sherren Mocha, MD   Advanced Practice Provider:  Liliane Shi, PA-C Electrophysiologist:  None       Referring MD: Laurey Morale, MD   Chief Complaint:  Follow-up (CAD, CHF)    Patient Profile:    [name] is a 75 y.o. female with:   Coronary artery disease  ? [place] in 2010 >> PCI: DES to RCA ? Cath 12/2014: mod non-obs dz in LAD, RCA stent patent  (HFpEF) heart failure with preserved ejection fraction  ? Echo 6/16: EF 60-65, Gr 2 DD  Diabetes mellitus 2  Hypertension  ? Intol of Carvedilol (swelling)  Hyperlipidemia   S/p Prior TIA  MRI 10/21: "remote lacunar infarcts in R caudate and b/l corona radiata"  Carotid artery dz ? S/p R CEA  GERD  Lumbar spine disease ? S/p prior fusion  Breast CA (DCIS) ? S/p L lumpectomy   Prior CV studies: Carotid US 03/30/2020 Bilateral ICA 1-39  LHC 12/29/14 LM: Ostial 30 LAD: prox 50, dist 50; D1 60 LCx: lum irregs RCA: prox 30, mid stent ok Med Rx  Echo 12/30/14 EF 60% to 65%. Wall motion was normal; Grade 2 diastolic dysfunction   Myoview 12/20/14 Possible small defect in the inferolateral wall. Ischemia cannot be completely ruled out but there is significant diaphragmatic and soft tissue attenuation on RAW images noted. This is a low risk study. EF55-65%  Renal Art [place] 10/29/11 Normal renal arteries bilaterally  History of Present Illness:    Ms. [name] was last seen in 7/21.  She returns for f/u.  She is here alone.  She has been doing well.  She has not had chest discomfort, syncope, orthopnea, leg edema.  She has not had significant shortness of breath.  Her blood pressures at home have been optimal.      Past Medical History:  Diagnosis Date  . Abnormal nuclear stress test 12/29/2014  . Allergy    . Anemia, unspecified   . Arthritis   . Blood transfusion without reported diagnosis    2018  . CAD (coronary artery disease)    sees. Dr. Burt Knack. s/p cath with PCI of RCA (xience des) June 2010. Pt also with LAD and D2 dzs-NL FFR in both areas med Rx  . Carotid artery occlusion   . Cataract    sees Dr. Herbert Deaner   . Depressive disorder, not elsewhere classified   . Duodenal nodule   . Edema   . Fibromyalgia   . Gastric polyps    COLON  . GERD (gastroesophageal reflux disease)   . Glaucoma    narrow angle, sees Dr. Herbert Deaner   . Headache(784.0)   . Hearing loss    left ear  . Heart murmur    questionable  . Hemorrhoids   . HSV (herpes simplex virus) anogenital infection 05/2014  . HTN (hypertension)   . Hyperlipidemia   . Low back pain    she sees Dr. Louanne Skye, also Dr. Ernestina Patches for pain medications and Benjiman Core PA for injections   . Obesity   . Osteopenia 01/2018   T score -1.6 FRAX 7.1% / 1.4%  . Peripheral vascular disease ([place])   . Stroke [facility])    TIA  "many yrs ago"    . TIA (transient ischemic  attack)    also Hx of it.   . Type II or unspecified type diabetes mellitus without mention of complication, not stated as uncontrolled    sees Dr. Carrolyn Meiers     Current Medications: Current Meds  Medication Sig  . ACCU-CHEK GUIDE test strip 1 each by Other route in the morning and at bedtime.  Marland Kitchen aspirin EC 81 MG tablet Take 1 tablet (81 mg total) by mouth daily. Swallow whole.  . cyclobenzaprine (FLEXERIL) 10 MG tablet Take 1 tablet (10 mg total) by mouth 3 (three) times daily as needed for muscle spasms.  . diclofenac sodium (VOLTAREN) 1 % GEL Apply 2 g topically 4 (four) times daily as needed (for pain).  Marland Kitchen ergocalciferol (VITAMIN D2) 50000 UNITS capsule Take 50,000 Units by mouth every Saturday. On Saturday.  Marland Kitchen FARXIGA 10 MG TABS tablet Take 10 mg by mouth every morning.  . furosemide (LASIX) 40 MG tablet Take 40 mg by mouth daily.  Marland Kitchen HUMALOG 100 UNIT/ML injection   .  hydrALAZINE (APRESOLINE) 100 MG tablet TAKE 1 TABLET BY MOUTH THREE TIMES DAILY  . ibuprofen (ADVIL) 600 MG tablet Take 1 tablet (600 mg total) by mouth every 6 (six) hours as needed.  . Insulin Disposable Pump (V-GO 40) KIT Inject 1 application into the skin See admin instructions.  . Insulin Human (INSULIN PUMP) SOLN Inject into the skin. Uses Novolog insulin for pump  . irbesartan (AVAPRO) 300 MG tablet TAKE 1 TABLET BY MOUTH ONCE DAILY . APPOINTMENT REQUIRED FOR FUTURE REFILLS  . isosorbide mononitrate (IMDUR) 60 MG 24 hr tablet Take 1.5 tablets (90 mg total) by mouth daily.  Marland Kitchen latanoprost (XALATAN) 0.005 % ophthalmic solution Place 1 drop into both eyes at bedtime.   . metoprolol succinate (TOPROL-XL) 100 MG 24 hr tablet Take 1 tablet (100 mg total) by mouth daily. Pt needs to keep upcoming appt for further refills  . montelukast (SINGULAIR) 10 MG tablet Take 10 mg by mouth daily.  . nitroGLYCERIN (NITROSTAT) 0.4 MG SL tablet Place 1 tablet (0.4 mg total) under the tongue every 5 (five) minutes as needed for chest pain.  Marland Kitchen nystatin-triamcinolone (MYCOLOG II) cream Apply 1 application topically 2 (two) times daily.  Marland Kitchen OZEMPIC, 1 MG/DOSE, 4 MG/3ML SOPN Inject 1 mg into the skin once a week.  . pantoprazole (PROTONIX) 40 MG tablet Take 1 tablet by mouth once daily  . rosuvastatin (CRESTOR) 40 MG tablet Take 1 tablet by mouth once daily  . spironolactone (ALDACTONE) 50 MG tablet Take 1 tablet by mouth once daily     Allergies:   Patient has no known allergies.   Social History   Tobacco Use  . Smoking status: Former Smoker    Quit date: 01/22/1967    Years since quitting: 53.6  . Smokeless tobacco: Never Used  Vaping Use  . Vaping Use: Never used  Substance Use Topics  . Alcohol use: No    Alcohol/week: 0.0 standard drinks  . Drug use: No     Family Hx: [name] family history includes Breast cancer (age of onset: 22) in her cousin; Breast cancer (age of onset: 78) in her  maternal aunt; Breast cancer (age of onset: 4) in her cousin; Breast cancer (age of onset: 21) in her cousin; Cancer in her brother, father, and maternal aunt; Diabetes in her brother; Esophageal cancer in her cousin; Heart disease in her brother and maternal grandfather; Hyperlipidemia in her sister; Hypertension in her brother, mother, and sister;  Prostate cancer (age of onset: 49) in her cousin; Stomach cancer (age of onset: 37) in her maternal grandmother; Varicose Veins in her brother. There is no history of Coronary artery disease, Colon cancer, Colon polyps, or Rectal cancer.  ROS   EKGs/Labs/Other Test Reviewed:    EKG:  EKG is   ordered today.  The ekg ordered today demonstrates normal sinus rhythm, heart rate 71, normal axis, increased artifact, no acute changes, QTC 465  Recent Labs: 03/09/2020: TSH 2.60 03/23/2020: ALT 21; BUN 16; Creatinine, Ser 1.08; Hemoglobin 13.1; Platelets 177; Potassium 4.3; Sodium 142   Recent Lipid Panel Lab Results  Component Value Date/Time   CHOL 103 03/09/2020 10:43 AM   TRIG 71 03/09/2020 10:43 AM   HDL 38 (L) 03/09/2020 10:43 AM   CHOLHDL 2.7 03/09/2020 10:43 AM   LDLCALC 50 03/09/2020 10:43 AM      Risk Assessment/Calculations:      Physical Exam:    VS:  BP (!) 112/52   Pulse 71   Ht 5' 3"  (1.6 m)   Wt 172 lb 12.8 oz (78.4 kg)   SpO2 96%   BMI 30.61 kg/m     Wt Readings from Last 3 Encounters:  09/20/20 172 lb 12.8 oz (78.4 kg)  07/15/20 172 lb 4.8 oz (78.2 kg)  04/01/20 175 lb 4 oz (79.5 kg)     Constitutional:      Appearance: Healthy appearance. Not in distress.  Neck:     Vascular: JVD normal.  Pulmonary:     Effort: Pulmonary effort is normal.     Breath sounds: No wheezing. No rales.  Cardiovascular:     Normal rate. Regular rhythm. Normal S1. Normal S2.     Murmurs: There is no murmur.  Edema:    Peripheral edema absent.  Abdominal:     Palpations: Abdomen is soft. There is no hepatomegaly.  Skin:    General:  Skin is warm and dry.  Neurological:     Mental Status: Alert and oriented to person, place and time.     Cranial Nerves: Cranial nerves are intact.      ASSESSMENT & PLAN:    1. Coronary artery disease involving native coronary artery of native heart without angina pectoris s/p drug-eluting stent to the RCA in 2010. Cardiac catheterization in 2016 demonstrated patent RCA stent and moderate nonobstructive disease in the LAD.  She is doing well without anginal symptoms.  Continue current management with rosuvastatin, aspirin, isosorbide, metoprolol succinate.  2. Chronic heart failure with preserved ejection fraction (HCC) Echocardiogram in 2016 with normal EF and moderate diastolic dysfunction.  She has significant limitations related to her back.  She is NYHA II.  Volume status is stable.  Continue current dose of furosemide.  3. Bilateral carotid artery disease, unspecified type ([place]) Carotid US in 9/21 with mild plaque bilaterally.  Continue aspirin, rosuvastatin.  4. Essential hypertension [name] blood pressure is controlled on her current regimen.  Continue current therapy.   5. Mixed hyperlipidemia LDL optimal on most recent lab work.  Continue current Rx.     Dispo:  Return in about 1 year (around 09/20/2021) for Routine Follow Up, w/ Dr. Burt Knack, or Richardson Dopp, PA-C, in person.   Medication Adjustments/Labs and Tests Ordered: Current medicines are reviewed at length with the patient today.  Concerns regarding medicines are outlined above.  Tests Ordered: Orders Placed This Encounter  Procedures  . EKG 12-Lead   Medication Changes: Meds ordered this encounter  Medications  .  aspirin EC 81 MG tablet    Sig: Take 1 tablet (81 mg total) by mouth daily. Swallow whole.    Dispense:  90 tablet    Refill:  3    Order Specific Question:   Supervising Provider    Answer:   Lelon Perla [[id number]]    Signed, Richardson Dopp, PA-C  09/20/2020 10:09 AM    [facility] [place], [place], [place]  [address] Phone: [phone]; Fax: [phone]

## 2020-09-19 NOTE — Progress Notes (Signed)
[place]   [phone] [phone]   Clinic Follow up Note   Patient Care Team: Laurey Morale, MD as PCP - Cyndia Diver, MD as PCP - Cardiology (Cardiology) Reynold Bowen, MD as Consulting Physician (Endocrinology) Ashok Pall, MD as Consulting Physician (Neurosurgery) Earlie Server, MD as Consulting Physician (Orthopedic Surgery) Eli Hose, [facility] (Inactive) as Pharmacist (Pharmacist) Mauro Kaufmann, RN as Oncology Nurse Navigator Rockwell Germany, RN as Oncology Nurse Navigator Sharmon Revere as Physician Assistant (Cardiology) 09/20/2020  CHIEF COMPLAINT: Follow up left breast DCIS   SUMMARY OF ONCOLOGIC HISTORY: Oncology History Overview Note  Cancer Staging No matching staging information was found for the patient.    Atypical lobular hyperplasia of left breast  01/26/2014 Surgery   Left Lumpectomy   Diagnosis Breast, excision, Palpable left - LOBULAR NEOPLASIA (ATYPICAL LOBULAR HYPERPLASIA) SEE COMMENT - MICROCALCIFICATIONS IDENTIFIED. - SURGICAL MARGIN, NEGATIVE FOR ATYPIA OR MALIGNANCY. Microscopic Comment The surgical resection margin(s) of the specimen were inked and microscopically evaluated. Multiple representative sections demonstrate foci of atypical lobular hyperplasia. Additional non-neoplastic findings to include benign radial scar, fibrocystic change, usual ductal hyperplasia, columnar cell hyperplasia/change without atypia, benign adenosis, patchy mild periductal chronic inflammation, and solitary hyalinizing calcified fibroadenomatoid nodule. There are abundant microcalcifications present within benign lobules. (CRR:ecj 01/27/2014)   02/12/2014 Initial Diagnosis   Atypical lobular hyperplasia of left breast   11/2014 - 04/2015 Anti-estrogen oral therapy   She tried anastrozole in 11/2014 and Tamoxifen in 03/2015 for about one month each, and stopped due to side effects.    06/27/2017  Pathology Results   Diagnosis 1. Breast, left, needle core biopsy, posterior upper outer quadrant - FIBROCYSTIC CHANGES. - THERE IS NO EVIDENCE OF MALIGNANCY. - SEE COMMENT. 2. Breast, left, needle core biopsy, anterior lower inner quadrant - FIBROCYSTIC CHANGES. - THERE IS NO EVIDENCE OF MALIGNANCY. - SEE COMMENT. Microscopic Comment 1. , 2. The results were called to [facility] on 06/28/2017. (JBK:ecj 06/29/1027)   07/24/2017 Surgery   Left Lumpectomy  Diagnosis Breast, lumpectomy, Left - FIBROCYSTIC CHANGES. - HEALING BIOPSY SITE. - THERE IS NO EVIDENCE OF MALIGNANCY. - SEE COMMENT. Microscopic Comment The surgical resection margin(s) of the specimen were inked and microscopically evaluated.   Ductal carcinoma in situ (DCIS) of left breast  12/16/2019 Initial Biopsy   Diagnosis 1. Breast, right, needle core biopsy, upper inner - FIBROCYSTIC CHANGES. - NO MALIGNANCY IDENTIFIED. 2. Breast, right, needle core biopsy, lateral - FIBROCYSTIC CHANGES WITH FOCAL ADENOSIS. - NO MALIGNANCY IDENTIFIED. 3. Breast, left, needle core biopsy, lateral - DUCTAL CARCINOMA IN SITU, INTERMEDIATE GRADE. - SEE MICROSCOPIC DESCRIPTION Microscopic Comment 3. Immunohistochemistry for p63, calponin and smooth muscle myosin and E-Cadherin supports the diagnosis. Estrogen and progesterone receptors will be performed. Dr. Vic Ripper has reviewed part 3 and agrees.   12/16/2019 Receptors her2   3. PROGNOSTIC INDICATORS Results: IMMUNOHISTOCHEMICAL AND MORPHOMETRIC ANALYSIS PERFORMED MANUALLY Estrogen Receptor: 95%, POSITIVE, STRONG-MODERATE STAINING INTENSITY Progesterone Receptor: >95%, POSITIVE, STRONG STAINING INTENSITY   01/06/2020 Initial Diagnosis   Ductal carcinoma in situ (DCIS) of left breast   01/13/2020 Surgery   LEFT BREAST LUMPECTOMY WITH RADIOACTIVE SEED LOCALIZATION by Dr Brantley Stage   01/13/2020 Pathology Results   FINAL MICROSCOPIC DIAGNOSIS:   A. BREAST, LEFT,  LUMPECTOMY:  - Intermediate grade ductal carcinoma in situ with necrosis, spanning 2  cm.  - Resection margins are negative.  - Biopsy site and cavity.  - See oncology table.  02/11/2020 - 03/04/2020 Radiation Therapy   Adjuvant Radiation with Dr Isidore Moos     Anti-estrogen oral therapy   She declined as she did not tolerate before     CURRENT THERAPY: Surveillance   INTERVAL HISTORY: [name] returns for follow up [facility] and DCIS as scheduled. She was last seen 03/23/20 for routine visit. She underwent annual mammogram 08/31/20 which was reportedly normal, I do not have the report.  She denies changes in her health overall.  She continues to have chronic back pain, now seeing Dr. Sena Hitch at [facility]surgery.  She attributes her fatigue to chronic back pain in radiation within the last year.  Her hypertension and diabetes are under control.  She notes the left breast is darker and heavier without new lump/mass, nipple discharge or inversion, or skin change.  Denies recent infection or change in bowel habits.  She is due a colonoscopy this year.  She saw cardiology this morning before today's visit, and saw Dr. Brantley Stage a few weeks ago.   MEDICAL HISTORY:  Past Medical History:  Diagnosis Date  . Abnormal nuclear stress test 12/29/2014  . Allergy   . Anemia, unspecified   . Arthritis   . Blood transfusion without reported diagnosis    2018  . CAD (coronary artery disease)    sees. Dr. Burt Knack. s/p cath with PCI of RCA (xience des) June 2010. Pt also with LAD and D2 dzs-NL FFR in both areas med Rx  . Carotid artery occlusion   . Cataract    sees Dr. Herbert Deaner   . Depressive disorder, not elsewhere classified   . Duodenal nodule   . Edema   . Fibromyalgia   . Gastric polyps    COLON  . GERD (gastroesophageal reflux disease)   . Glaucoma    narrow angle, sees Dr. Herbert Deaner   . Headache(784.0)   . Hearing loss    left ear  . Heart murmur    questionable  . Hemorrhoids   . HSV (herpes  simplex virus) anogenital infection 05/2014  . HTN (hypertension)   . Hyperlipidemia   . Low back pain    she sees Dr. Louanne Skye, also Dr. Ernestina Patches for pain medications and Benjiman Core PA for injections   . Obesity   . Osteopenia 01/2018   T score -1.6 FRAX 7.1% / 1.4%  . Peripheral vascular disease ([place])   . Stroke [facility])    TIA  "many yrs ago"    . TIA (transient ischemic attack)    also Hx of it.   . Type II or unspecified type diabetes mellitus without mention of complication, not stated as uncontrolled    sees Dr. Carrolyn Meiers     SURGICAL HISTORY: Past Surgical History:  Procedure Laterality Date  . ABDOMINAL HYSTERECTOMY  age 40   TAH and USO  Leiomyomata  . ANTERIOR CERVICAL CORPECTOMY N/A 06/11/2014   Procedure: ANTERIOR CERVICAL CORPECTOMY CERVICAL SIX; WITH CERVICAL FIVE TO CERVICAL SEVEN ARTHRODESIS;  Surgeon: Ashok Pall, MD;  Location: [place] NEURO ORS;  Service: Neurosurgery;  Laterality: N/A;  . BACK SURGERY  2008   lumb fusion  . BREAST BIOPSY Left 01/26/2014   Procedure: EXCISION LEFT BREAST MASS;  Surgeon: Adin Hector, MD;  Location: [place];  Service: General;  Laterality: Left;  . BREAST LUMPECTOMY WITH RADIOACTIVE SEED LOCALIZATION Left 07/24/2017   Procedure: LEFT BREAST LUMPECTOMY WITH RADIOACTIVE SEED LOCALIZATION ERAS;  Surgeon: Erroll Luna, MD;  Location: [place];  Service:  General;  Laterality: Left;  . BREAST LUMPECTOMY WITH RADIOACTIVE SEED LOCALIZATION Left 01/13/2020   Procedure: LEFT BREAST LUMPECTOMY WITH RADIOACTIVE SEED LOCALIZATION;  Surgeon: Erroll Luna, MD;  Location: [place];  Service: General;  Laterality: Left;  . CARDIAC CATHETERIZATION    . CARDIAC CATHETERIZATION N/A 12/29/2014   Procedure: Left Heart Cath and Coronary Angiography;  Surgeon: Sherren Mocha, MD;  Location: [place] CV LAB;  Service: Cardiovascular;  Laterality: N/A;  . CAROTID ENDARTERECTOMY  march 2012   per Dr.  Morton Amy  . CARPAL TUNNEL RELEASE Left 05/14/2014   Procedure: Left Carpal tunnel release;  Surgeon: Ashok Pall, MD;  Location: [place] NEURO ORS;  Service: Neurosurgery;  Laterality: Left;  Left Carpal tunnel release  . CARPAL TUNNEL RELEASE Bilateral   . CHOLECYSTECTOMY    . COLONOSCOPY  06/26/2017   per Dr. Henrene Pastor, adenomatous polyp, repeat in 5 yrs    . CORONARY STENT PLACEMENT     Dr. Burt Knack  . DILATION AND CURETTAGE OF UTERUS    . ESOPHAGOGASTRODUODENOSCOPY  02-08-06   per Dr. Henrene Pastor, gastritis   . EYE SURGERY Left June 2016   Cataract  . GLAUCOMA SURGERY     with laser, per Dr. Henrene Pastor   . HARDWARE REMOVAL  10/30/2016   Procedure: HARDWARE REMOVAL;  Surgeon: Jessy Oto, MD;  Location: [place];  Service: Orthopedics;;  . LUMBAR FUSION  2008  . POLYPECTOMY    . POSTERIOR LUMBAR FUSION  10/30/2016   Removal of hardware rods and pedicle screws lumbar four, Extension of fusion L4-5 to L5-S1 with reinsertion of screws at L 4, left L5-S1 transforaminal lumbar interbody fusion, Exploration  left L5 nerve root, bilateral decompressive laminectomy L3-4/notes 10/30/2016  . ULNAR NERVE TRANSPOSITION Left 05/14/2014   Procedure: Left Ulnar nerve decompression;  Surgeon: Ashok Pall, MD;  Location: [place] NEURO ORS;  Service: Neurosurgery;  Laterality: Left;  Left Ulnar nerve decompression    I have reviewed the social history and family history with the patient and they are unchanged from previous note.  ALLERGIES:  has No Known Allergies.  MEDICATIONS:  Current Outpatient Medications  Medication Sig Dispense Refill  . ACCU-CHEK GUIDE test strip 1 each by Other route in the morning and at bedtime.    Mar[place] Kitchen aspirin EC 81 MG tablet Take 1 tablet (81 mg total) by mouth daily. Swallow whole. 90 tablet 3  . cyclobenzaprine (FLEXERIL) 10 MG tablet Take 1 tablet (10 mg total) by mouth 3 (three) times daily as needed for muscle spasms. 90 tablet 2  . diclofenac sodium (VOLTAREN) 1 % GEL Apply 2 g  topically 4 (four) times daily as needed (for pain).    Marland Kitchen ergocalciferol (VITAMIN D2) 50000 UNITS capsule Take 50,000 Units by mouth every Saturday. On Saturday.    Marland Kitchen FARXIGA 10 MG TABS tablet Take 10 mg by mouth every morning.    . furosemide (LASIX) 40 MG tablet Take 40 mg by mouth daily.    Marland Kitchen HUMALOG 100 UNIT/ML injection     . hydrALAZINE (APRESOLINE) 100 MG tablet TAKE 1 TABLET BY MOUTH THREE TIMES DAILY 270 tablet 2  . ibuprofen (ADVIL) 600 MG tablet Take 1 tablet (600 mg total) by mouth every 6 (six) hours as needed. 30 tablet 0  . Insulin Disposable Pump (V-GO 40) KIT Inject 1 application into the skin See admin instructions.    . Insulin Human (INSULIN PUMP) SOLN Inject into the skin. Uses Novolog insulin for pump    .  irbesartan (AVAPRO) 300 MG tablet TAKE 1 TABLET BY MOUTH ONCE DAILY . APPOINTMENT REQUIRED FOR FUTURE REFILLS 90 tablet 3  . isosorbide mononitrate (IMDUR) 60 MG 24 hr tablet Take 1.5 tablets (90 mg total) by mouth daily. 135 tablet 2  . latanoprost (XALATAN) 0.005 % ophthalmic solution Place 1 drop into both eyes at bedtime.     . metoprolol succinate (TOPROL-XL) 100 MG 24 hr tablet Take 1 tablet (100 mg total) by mouth daily. Pt needs to keep upcoming appt for further refills 90 tablet 1  . montelukast (SINGULAIR) 10 MG tablet Take 10 mg by mouth daily.    . nitroGLYCERIN (NITROSTAT) 0.4 MG SL tablet Place 1 tablet (0.4 mg total) under the tongue every 5 (five) minutes as needed for chest pain. 25 tablet 1  . nystatin-triamcinolone (MYCOLOG II) cream Apply 1 application topically 2 (two) times daily. 30 g 2  . OZEMPIC, 1 MG/DOSE, 4 MG/3ML SOPN Inject 1 mg into the skin once a week.    . pantoprazole (PROTONIX) 40 MG tablet Take 1 tablet by mouth once daily 90 tablet 0  . rosuvastatin (CRESTOR) 40 MG tablet Take 1 tablet by mouth once daily 90 tablet 0  . spironolactone (ALDACTONE) 50 MG tablet Take 1 tablet by mouth once daily 90 tablet 2   No current  facility-administered medications for this visit.    PHYSICAL EXAMINATION:  Vitals:   09/20/20 1048  BP: 127/63  Pulse: 63  Resp: 18  Temp: 97.8 F (36.6 C)  SpO2: 100%   Filed Weights   09/20/20 1048  Weight: 172 lb 12.8 oz (78.4 kg)    GENERAL:alert, no distress and comfortable SKIN: No rash EYES: sclera clear NECK: Without mass LYMPH:  no palpable cervical or supraclavicular lymphadenopathy LUNGS:  normal breathing effort HEART: no lower extremity edema NEURO: alert & oriented x 3 with fluent speech, in wheelchair Breast exam: Breasts are mostly symmetric without nipple discharge or inversion.  S/p left lumpectomy and radiation.  There is mild hyperpigmentation and soft tissue edema.  No palpable mass in either breast or axilla that I could appreciate.  There is some lumpy breast tissue in the right outer upper breast.    LABORATORY DATA:  I have reviewed the data as listed CBC Latest Ref Rng & Units 03/23/2020 03/09/2020 01/12/2020  WBC 4.0 - 10.5 K/uL 8.0 9.2 10.3  Hemoglobin 12.0 - 15.0 g/dL 13.1 13.1 12.4  Hematocrit 36.0 - 46.0 % 41.5 40.0 40.2  Platelets 150 - 400 K/uL 177 212 182     CMP Latest Ref Rng & Units 03/23/2020 03/09/2020 01/12/2020  Glucose 70 - 99 mg/dL 87 84 121(H)  BUN 8 - 23 mg/dL 16 17 16   Creatinine 0.44 - 1.00 mg/dL 1.08(H) 0.92 1.21(H)  Sodium 135 - 145 mmol/L 142 142 143  Potassium 3.5 - 5.1 mmol/L 4.3 3.7 4.1  Chloride 98 - 111 mmol/L 108 105 106  CO2 22 - 32 mmol/L 29 29 27   Calcium 8.9 - 10.3 mg/dL 9.8 9.8 9.5  Total Protein 6.5 - 8.1 g/dL 6.9 6.5 7.1  Total Bilirubin 0.3 - 1.2 mg/dL 0.6 0.5 0.6  Alkaline Phos 38 - 126 U/L 69 - 64  AST 15 - 41 U/L 22 16 22   ALT 0 - 44 U/L 21 13 19       RADIOGRAPHIC STUDIES: I have personally reviewed the radiological images as listed and agreed with the findings in the report. No results found.   ASSESSMENT & PLAN:  [name] is a 75 y.o. female with    1. Left Breast DCIS,  Intermediate Grade, ER+/PR+ -She was discovered on screening MRI, diagnosed in 12/2019. S/p Left lumpectomy with Dr Brantley Stage on 01/13/20 and Adjuvant Radiation with Dr Isidore Moos 02/11/20-03/04/20.  -Due to her h/o of left [facility] and recent left DCIS, she is at higher risk of developing future breast cancer.  She is agreeable with annual mammograms and MRIs 6 months apart -Given her ER/PR positive disease she would benefit from antiestrogen therapy to reduce her risk of future breast cancer. She understands her risk, she previously tried anastrozole and tamoxifen for [facility],  but tolerated poorly and therefore declined trying again  -Reviewed breast cancer surveillance. She will continue annual screening mammogram, self exams, and a routine office visit with lab and exam with [place]. She will also continue breast MRI's for additional screening.  -Mammogram 08/2020 reportedly normal, have requested report; next MRI breast in 02/2021.   2. H/o Left breast ALH, Genetics Negative  -S/p left breast lumpectomy as of 01/26/14, whichshowed [place].  -She tried chemo prevention with anastrozole and tamoxifen but could not tolerate due to side effects. She declined for treatment with Raloxifene  -She has strong family history of breast cancer, but her genetic testing was negative.  3. HTN, DM, CAD, history of TIA -She will continue follow-up with her primary care physician and continues medications.   4. Chronic Lower back pain, Right hip pain  -She had 2 back surgeries in the past -In a wheelchair today, ambulates with cane -Now followed by Dr. Sena Hitch at [facility] neurosurgery  Disposition: [name] is clinically doing well from a breast cancer standpoint.  Exam is benign, mammogram 08/31/2020 reportedly normal, I have requested the report.  There is no clinical concern for recurrence.  She will continue high risk screening program with annual mammograms and MRIs 6 months apart.  Due to the painful nature of the MRIs with her  chronic back pain, I agree to give tramadol p.o. x1 prior to MRI, will prescribe closer to that date.  She will see [place] back in 6 months, and Dr. Brantley Stage in 1 year.  Orders Placed This Encounter  Procedures  . MR BREAST BILATERAL W WO CONTRAST INC CAD    Standing Status:   Future    Standing Expiration Date:   09/20/2021    Order Specific Question:   If indicated for the ordered procedure, I authorize the administration of contrast media per Radiology protocol    Answer:   Yes    Order Specific Question:   What is the patient's sedation requirement?    Answer:   No Sedation    Order Specific Question:   Does the patient have a pacemaker or implanted devices?    Answer:   No    Order Specific Question:   Preferred imaging location?    Answer:   [facility] (table limit-550lbs)    All questions were answered. The patient knows to call the clinic with any problems, questions or concerns. No barriers to learning was detected.     Alla Feeling, NP 09/20/20

## 2020-09-20 ENCOUNTER — Other Ambulatory Visit: Payer: Self-pay

## 2020-09-20 ENCOUNTER — Encounter: Payer: Self-pay | Admitting: Nurse Practitioner

## 2020-09-20 ENCOUNTER — Ambulatory Visit (INDEPENDENT_AMBULATORY_CARE_PROVIDER_SITE_OTHER): Payer: Medicare Other | Admitting: Physician Assistant

## 2020-09-20 ENCOUNTER — Encounter: Payer: Self-pay | Admitting: Physician Assistant

## 2020-09-20 ENCOUNTER — Ambulatory Visit: Payer: Medicare Other

## 2020-09-20 ENCOUNTER — Inpatient Hospital Stay: Payer: Medicare Other | Attending: Nurse Practitioner | Admitting: Nurse Practitioner

## 2020-09-20 ENCOUNTER — Telehealth: Payer: Self-pay | Admitting: Family Medicine

## 2020-09-20 VITALS — BP 112/52 | HR 71 | Ht 63.0 in | Wt 172.8 lb

## 2020-09-20 VITALS — BP 127/63 | HR 63 | Temp 97.8°F | Resp 18 | Ht 63.0 in | Wt 172.8 lb

## 2020-09-20 DIAGNOSIS — S161XXD Strain of muscle, fascia and tendon at neck level, subsequent encounter: Secondary | ICD-10-CM | POA: Diagnosis not present

## 2020-09-20 DIAGNOSIS — I779 Disorder of arteries and arterioles, unspecified: Secondary | ICD-10-CM | POA: Diagnosis not present

## 2020-09-20 DIAGNOSIS — Z79899 Other long term (current) drug therapy: Secondary | ICD-10-CM | POA: Diagnosis not present

## 2020-09-20 DIAGNOSIS — R293 Abnormal posture: Secondary | ICD-10-CM

## 2020-09-20 DIAGNOSIS — Z7984 Long term (current) use of oral hypoglycemic drugs: Secondary | ICD-10-CM | POA: Insufficient documentation

## 2020-09-20 DIAGNOSIS — Z8673 Personal history of transient ischemic attack (TIA), and cerebral infarction without residual deficits: Secondary | ICD-10-CM | POA: Insufficient documentation

## 2020-09-20 DIAGNOSIS — I10 Essential (primary) hypertension: Secondary | ICD-10-CM

## 2020-09-20 DIAGNOSIS — E782 Mixed hyperlipidemia: Secondary | ICD-10-CM | POA: Diagnosis not present

## 2020-09-20 DIAGNOSIS — I2510 Atherosclerotic heart disease of native coronary artery without angina pectoris: Secondary | ICD-10-CM | POA: Insufficient documentation

## 2020-09-20 DIAGNOSIS — Z794 Long term (current) use of insulin: Secondary | ICD-10-CM | POA: Insufficient documentation

## 2020-09-20 DIAGNOSIS — I739 Peripheral vascular disease, unspecified: Secondary | ICD-10-CM | POA: Insufficient documentation

## 2020-09-20 DIAGNOSIS — Z923 Personal history of irradiation: Secondary | ICD-10-CM | POA: Diagnosis not present

## 2020-09-20 DIAGNOSIS — D0512 Intraductal carcinoma in situ of left breast: Secondary | ICD-10-CM | POA: Insufficient documentation

## 2020-09-20 DIAGNOSIS — I5032 Chronic diastolic (congestive) heart failure: Secondary | ICD-10-CM | POA: Diagnosis not present

## 2020-09-20 DIAGNOSIS — S29019D Strain of muscle and tendon of unspecified wall of thorax, subsequent encounter: Secondary | ICD-10-CM

## 2020-09-20 DIAGNOSIS — R262 Difficulty in walking, not elsewhere classified: Secondary | ICD-10-CM | POA: Insufficient documentation

## 2020-09-20 DIAGNOSIS — E119 Type 2 diabetes mellitus without complications: Secondary | ICD-10-CM | POA: Diagnosis not present

## 2020-09-20 DIAGNOSIS — K219 Gastro-esophageal reflux disease without esophagitis: Secondary | ICD-10-CM | POA: Diagnosis not present

## 2020-09-20 DIAGNOSIS — N6092 Unspecified benign mammary dysplasia of left breast: Secondary | ICD-10-CM

## 2020-09-20 DIAGNOSIS — E118 Type 2 diabetes mellitus with unspecified complications: Secondary | ICD-10-CM

## 2020-09-20 MED ORDER — ASPIRIN EC 81 MG PO TBEC: 81.0000 mg | DELAYED_RELEASE_TABLET | Freq: Every day | ORAL | 3 refills | Status: DC

## 2020-09-20 NOTE — Telephone Encounter (Signed)
[facility] is calling and stated that Cira Rue NP is requesting patients mammogram from 08/31/2020 to be faxed to [phone]. CB is [phone]

## 2020-09-20 NOTE — Therapy (Signed)
[place], [name], [address] Phone: [phone]   Fax:  [phone]  Physical Therapy Treatment  Patient Details  Name: [name] MRN: [id number] Date of Birth: [birth year] Referring Provider (PT): Laurey Morale, MD   Encounter Date: 09/20/2020   PT End of Session - 09/20/20 1431    Visit Number 6    Number of Visits 16    Date for PT Re-Evaluation 10/07/20    Authorization Type Medicare    Authorization Time Period FOTO visit #10    Progress Note Due on Visit 10    PT Start Time 1221    PT Stop Time 1321    PT Time Calculation (min) 60 min    Equipment Utilized During Treatment Other (comment)   SPC   Activity Tolerance Patient tolerated treatment well    Behavior During Therapy [facility] for tasks assessed/performed           Past Medical History:  Diagnosis Date  . Abnormal nuclear stress test 12/29/2014  . Allergy   . Anemia, unspecified   . Arthritis   . Blood transfusion without reported diagnosis    2018  . CAD (coronary artery disease)    sees. Dr. Burt Knack. s/p cath with PCI of RCA (xience des) June 2010. Pt also with LAD and D2 dzs-NL FFR in both areas med Rx  . Carotid artery occlusion   . Cataract    sees Dr. Herbert Deaner   . Depressive disorder, not elsewhere classified   . Duodenal nodule   . Edema   . Fibromyalgia   . Gastric polyps    COLON  . GERD (gastroesophageal reflux disease)   . Glaucoma    narrow angle, sees Dr. Herbert Deaner   . Headache(784.0)   . Hearing loss    left ear  . Heart murmur    questionable  . Hemorrhoids   . HSV (herpes simplex virus) anogenital infection 05/2014  . HTN (hypertension)   . Hyperlipidemia   . Low back pain    she sees Dr. Louanne Skye, also Dr. Ernestina Patches for pain medications and Benjiman Core PA for injections   . Obesity   . Osteopenia 01/2018   T score -1.6 FRAX 7.1% / 1.4%  . Peripheral vascular disease ([place])   . Stroke [facility])    TIA  "many  yrs ago"    . TIA (transient ischemic attack)    also Hx of it.   . Type II or unspecified type diabetes mellitus without mention of complication, not stated as uncontrolled    sees Dr. Carrolyn Meiers     Past Surgical History:  Procedure Laterality Date  . ABDOMINAL HYSTERECTOMY  age 43   TAH and USO  Leiomyomata  . ANTERIOR CERVICAL CORPECTOMY N/A 06/11/2014   Procedure: ANTERIOR CERVICAL CORPECTOMY CERVICAL SIX; WITH CERVICAL FIVE TO CERVICAL SEVEN ARTHRODESIS;  Surgeon: Ashok Pall, MD;  Location: [place] NEURO ORS;  Service: Neurosurgery;  Laterality: N/A;  . BACK SURGERY  2008   lumb fusion  . BREAST BIOPSY Left 01/26/2014   Procedure: EXCISION LEFT BREAST MASS;  Surgeon: Adin Hector, MD;  Location: [place];  Service: General;  Laterality: Left;  . BREAST LUMPECTOMY WITH RADIOACTIVE SEED LOCALIZATION Left 07/24/2017   Procedure: LEFT BREAST LUMPECTOMY WITH RADIOACTIVE SEED LOCALIZATION ERAS;  Surgeon: Erroll Luna, MD;  Location: [place];  Service: General;  Laterality: Left;  . BREAST LUMPECTOMY WITH RADIOACTIVE SEED LOCALIZATION Left 01/13/2020  Procedure: LEFT BREAST LUMPECTOMY WITH RADIOACTIVE SEED LOCALIZATION;  Surgeon: Erroll Luna, MD;  Location: [place];  Service: General;  Laterality: Left;  . CARDIAC CATHETERIZATION    . CARDIAC CATHETERIZATION N/A 12/29/2014   Procedure: Left Heart Cath and Coronary Angiography;  Surgeon: Sherren Mocha, MD;  Location: [place] CV LAB;  Service: Cardiovascular;  Laterality: N/A;  . CAROTID ENDARTERECTOMY  march 2012   per Dr. Morton Amy  . CARPAL TUNNEL RELEASE Left 05/14/2014   Procedure: Left Carpal tunnel release;  Surgeon: Ashok Pall, MD;  Location: [place] [facility]UR[facility]S;  Service: Neurosurgery;  Laterality: Left;  Left Carpal tunnel release  . CARPAL TUNNEL RELEASE Bilateral   . CHOLECYSTECTOMY    . COLONOSCOPY  06/26/2017   per Dr. Henrene Pastor, adenomatous polyp, repeat in 5 yrs     . CORONARY STENT PLACEMENT     Dr. Burt Knack  . DILATION AND CURETTAGE OF UTERUS    . ESOPHAGOGASTRODUODENOSCOPY  02-08-06   per Dr. Henrene Pastor, gastritis   . EYE SURGERY Left June 2016   Cataract  . GLAUCOMA SURGERY     with laser, per Dr. Henrene Pastor   . HARDWARE REMOVAL  10/30/2016   Procedure: HARDWARE REMOVAL;  Surgeon: Jessy Oto, MD;  Location: [place];  Service: Orthopedics;;  . LUMBAR FUSION  2008  . POLYPECTOMY    . POSTERIOR LUMBAR FUSION  10/30/2016   Removal of hardware rods and pedicle screws lumbar four, Extension of fusion L4-5 to L5-S1 with reinsertion of screws at L 4, left L5-S1 transforaminal lumbar interbody fusion, Exploration  left L5 nerve root, bilateral decompressive laminectomy L3-4/notes 10/30/2016  . ULNAR NERVE TRANSPOSITION Left 05/14/2014   Procedure: Left Ulnar nerve decompression;  Surgeon: Ashok Pall, MD;  Location: [place] NEUR[facility] ORS;  Service: Neurosurgery;  Laterality: Left;  Left Ulnar nerve decompression    There were no vitals filed for this visit.   Subjective Assessment - 09/20/20 1227    Subjective Neck feels a little better. The exs help. Pain is primarily in the R upper trap. and shoulder blade.    Patient Stated Goals to try to get better than what she is    Currently in Pain? Yes    Pain Score 3     Pain Location Neck    Pain Orientation Right    Pain Descriptors / Indicators Aching;Dull    Pain Type Chronic pain    Pain Onset More than a month ago    Pain Frequency Intermittent    Aggravating Factors  turning head, looking up, lifting    Pain Relieving Factors heat    Effect of Pain on Daily Activities difficulty lifting weight, writing in calnedar on wall, looking up, turning head    Multiple Pain Sites Yes    Pain Score 5    Pain Location Thoracic    Pain Orientation Mid    Pain Descriptors / Indicators Aching    Pain Type Chronic pain    Pain Radiating Towards From shoulder to mid back    Pain Onset More than a month ago    Pain Frequency  Intermittent    Aggravating Factors  walking    Pain Relieving Factors heat    Effect of Pain on Daily Activities cannot lift grandson                             [facility] Adult PT Treatment/Exercise - 09/20/20 0001  Neck Exercises: Supine   Neck Retraction 10 reps;3 secs    Cervical Rotation Right;Left;5 reps    Cervical Rotation Limitations 5 sec    Shoulder ABduction 15 reps    Shoulder Abduction Limitations red  band      Moist Heat Therapy   Number Minutes Moist Heat 15 Minutes    Moist Heat Location Cervical;Lumbar Spine      Manual Therapy   Manual Therapy Soft tissue mobilization;Passive ROM    Soft tissue mobilization STM to thr r upper, levator, thoracic paraspinals and rhomboids      Neck Exercises: Stretches   Upper Trapezius Stretch Right;3 reps;30 seconds                    PT Short Term Goals - 08/12/20 1125      PT SHORT TERM GOAL #1   Title Pt will be independent and compliant with initial HEP.    Time 2    Period Weeks    Status New    Target Date 08/26/20             PT Long Term Goals - 09/20/20 1433      PT LONG TERM GOAL #3   Title Pt will increase lumbar FOTO ability to 58% and neck to 62%, in order to demonstrate improvement in perceived level of functional ability.  09/20/20: low back 40%, neck 52%    Baseline 39% lumbar, 52% neck    Time 8    Period Weeks    Status On-going    Target Date 10/07/20                 Plan - 09/20/20 1432    Clinical Impression Statement FOTO was re-assessed and pt's score for percieved functional ability did not change. Subjectively, pt is reporting a small degree of improvement in pain. PT was provided for upper body and cervical posterior chain strengthening. STM was provided with increased muscle tension and trigger points of the upper trap, levator, paraspinal and rhomboids. Moist was als provided to assist in pain reduction and decreased muscular tension. Pt reports  her home exs help with managing her pain.    Personal Factors and Comorbidities Comorbidity 3+;Time since onset of injury/illness/exacerbation;Past/Current Experience    Comorbidities Arthritis, DM, Glaucoma, heart murmur, HTN, HLD, stroke, cataracts    Examination-Activity Limitations Bend;Locomotion Level;Reach Overhead;Lift    Examination-Participation Restrictions Community Activity;Interpersonal Relationship;Cleaning;Laundry;Shop    Stability/Clinical Decision Making Evolving/Moderate complexity    Clinical Decision Making Moderate    Rehab Potential Fair    PT Frequency --   1 to 2x/week   PT Duration 8 weeks    PT Treatment/Interventions ADLs/Self Care Home Management;Cryotherapy;Electrical Stimulation;Iontophoresis 4mg /ml Dexamethasone;Moist Heat;Gait training;Stair training;Functional mobility training;Therapeutic activities;Therapeutic exercise;Balance training;Neuromuscular re-education;Patient/family education;Passive range of motion;Dry needling;Spinal Manipulations    PT Next Visit Plan continue postural exercises/progress periscapular strengthening and neck+ thoracic ROM, gentle stretches, further dry needling as needed, further manual therpay for STM as needed    PT Home Exercise Plan [id number]- cervical rettraction and cervical rotation in supine    Consulted and Agree with Plan of Care Patient           Patient will benefit from skilled therapeutic intervention in order to improve the following deficits and impairments:  Difficulty walking,Decreased balance,Decreased endurance,Decreased mobility,Postural dysfunction,Pain,Impaired flexibility,Increased fascial restricitons,Decreased strength,Decreased activity tolerance,Decreased range of motion,Impaired perceived functional ability,Improper body mechanics  Visit Diagnosis: Strain of neck muscle, subsequent encounter  Thoracic myofascial strain, subsequent  encounter  Poor posture  Difficulty in walking, not elsewhere  classified     Problem List Patient Active Problem List   Diagnosis Date Noted  . Ductal carcinoma in situ (DCIS) of left breast 01/06/2020  . Right-sided thoracic back pain 07/01/2019  . Hyperlipidemia 07/17/2017  . History of lumbar spinal fusion   . History of fusion of lumbar spine   . Anemia due to blood loss 11/02/2016    Class: Acute  . Lethargy 10/31/2016  . AKI (acute kidney injury) ([place]) 10/31/2016  . Chronic diastolic CHF (congestive heart failure) ([place]) 10/31/2016  . Cough   . Leukocytosis   . Spondylolisthesis, lumbar region 10/30/2016    Class: Chronic  . Spinal stenosis of lumbar region 10/30/2016    Class: Chronic  . Retained orthopedic hardware 10/30/2016    Class: Chronic  . Spinal stenosis of lumbar region with neurogenic claudication 10/30/2016  . Genetic testing 09/14/2015  . Atypical ductal hyperplasia of left breast 08/24/2015  . Family history of breast cancer   . Family history of prostate cancer   . Family history of stomach cancer   . Abnormal nuclear stress test 12/29/2014  . Cervical spondylosis with myelopathy and radiculopathy 06/11/2014  . Carpal tunnel syndrome 05/14/2014  . Aftercare following surgery of the circulatory system, [place] 03/11/2014  . Pain of right lower extremity 03/11/2014  . Atypical lobular hyperplasia of left breast 02/12/2014  . Fibromyalgia 09/21/2013  . Carotid artery disease ([place]) 02/29/2012  . Back abscess 01/22/2012  . Chest pain with moderate risk of acute coronary syndrome 12/09/2009  . Shortness of breath 06/10/2009  . Orthostatic hypotension 01/31/2009  . CAD (coronary artery disease) 12/23/2008  . Normocytic anemia 07/21/2008  . Headache(784.0) 07/21/2008  . History of TIA (transient ischemic attack) 07/21/2008  . Edema 02/27/2008  . Diabetes mellitus with complication ([place]) 48/25/0037  . Depression 07/16/2007  . Dyslipidemia 02/21/2007  . Essential hypertension 02/21/2007  . Asthma 02/21/2007  . GERD  02/21/2007  . LOW BACK PAIN 02/21/2007    Gar Ponto MS, PT 09/20/20 2:51 PM  [facility] [address] [place], [name], [address] Phone: [phone]   Fax:  [phone]  Name: Edythe Riches MRN: [id number] Date of Birth: 1946/05/30

## 2020-09-20 NOTE — Patient Instructions (Signed)
Medication Instructions:  Continue current medications as listed  *If you need a refill on your cardiac medications before your next appointment, please call your pharmacy*   Follow-Up: At [facility], you and your health needs are our priority.  As part of our continuing mission to provide you with exceptional heart care, we have created designated Provider Care Teams.  These Care Teams include your primary Cardiologist (physician) and Advanced Practice Providers (APPs -  Physician Assistants and Nurse Practitioners) who all work together to provide you with the care you need, when you need it.  We recommend signing up for the patient portal called "MyChart".  Sign up information is provided on this After Visit Summary.  MyChart is used to connect with patients for Virtual Visits (Telemedicine).  Patients are able to view lab/test results, encounter notes, upcoming appointments, etc.  Non-urgent messages can be sent to your provider as well.   To learn more about what you can do with MyChart, go to [email or web address].    Your next appointment:   12 month(s)  The format for your next appointment:   In Person  Provider:   Sherren Mocha, MD or Richardson Dopp, PA-C   Other Instructions

## 2020-09-21 ENCOUNTER — Telehealth: Payer: Self-pay | Admitting: Hematology

## 2020-09-21 NOTE — Telephone Encounter (Signed)
Left message with follow-up appointment per 3/15 los. Gave option to call back to reschedule if needed. 

## 2020-09-21 NOTE — Telephone Encounter (Signed)
Pt mammogram results was faxed to Cira Rue NP as requested

## 2020-09-22 ENCOUNTER — Other Ambulatory Visit: Payer: Self-pay

## 2020-09-22 ENCOUNTER — Ambulatory Visit: Payer: Medicare Other

## 2020-09-22 DIAGNOSIS — R262 Difficulty in walking, not elsewhere classified: Secondary | ICD-10-CM | POA: Diagnosis not present

## 2020-09-22 DIAGNOSIS — R293 Abnormal posture: Secondary | ICD-10-CM

## 2020-09-22 DIAGNOSIS — Y939 Activity, unspecified: Secondary | ICD-10-CM | POA: Diagnosis not present

## 2020-09-22 DIAGNOSIS — S161XXD Strain of muscle, fascia and tendon at neck level, subsequent encounter: Secondary | ICD-10-CM | POA: Diagnosis not present

## 2020-09-22 DIAGNOSIS — S29019D Strain of muscle and tendon of unspecified wall of thorax, subsequent encounter: Secondary | ICD-10-CM | POA: Diagnosis not present

## 2020-09-22 DIAGNOSIS — Y929 Unspecified place or not applicable: Secondary | ICD-10-CM | POA: Diagnosis not present

## 2020-09-22 NOTE — Therapy (Signed)
[place], [name], [address] Phone: [phone]   Fax:  [phone]  Physical Therapy Treatment  Patient Details  Name: [name] MRN: [id number] Date of Birth: 1945-09-25 Referring Provider (PT): Laurey Morale, MD   Encounter Date: 09/22/2020   PT End of Session - 09/22/20 1108    Visit Number 7    Number of Visits 16    Date for PT Re-Evaluation 10/07/20    Authorization Type Medicare    Authorization Time Period FOTO visit #10    Progress Note Due on Visit 10    PT Start Time 1051    PT Stop Time 1148    PT Time Calculation (min) 57 min    Equipment Utilized During Treatment Other (comment)   SPC   Activity Tolerance Patient tolerated treatment well    Behavior During Therapy [facility] for tasks assessed/performed           Past Medical History:  Diagnosis Date  . Abnormal nuclear stress test 12/29/2014  . Allergy   . Anemia, unspecified   . Arthritis   . Blood transfusion without reported diagnosis    2018  . CAD (coronary artery disease)    sees. Dr. Burt Knack. s/p cath with PCI of RCA (xience des) June 2010. Pt also with LAD and D2 dzs-NL FFR in both areas med Rx  . Carotid artery occlusion   . Cataract    sees Dr. Herbert Deaner   . Depressive disorder, not elsewhere classified   . Duodenal nodule   . Edema   . Fibromyalgia   . Gastric polyps    COLON  . GERD (gastroesophageal reflux disease)   . Glaucoma    narrow angle, sees Dr. Herbert Deaner   . Headache(784.0)   . Hearing loss    left ear  . Heart murmur    questionable  . Hemorrhoids   . HSV (herpes simplex virus) anogenital infection 05/2014  . HTN (hypertension)   . Hyperlipidemia   . Low back pain    she sees Dr. Louanne Skye, also Dr. Ernestina Patches for pain medications and Benjiman Core PA for injections   . Obesity   . Osteopenia 01/2018   T score -1.6 FRAX 7.1% / 1.4%  . Peripheral vascular disease ([place])   . Stroke [facility])    TIA  "many  yrs ago"    . TIA (transient ischemic attack)    also Hx of it.   . Type II or unspecified type diabetes mellitus without mention of complication, not stated as uncontrolled    sees Dr. Carrolyn Meiers     Past Surgical History:  Procedure Laterality Date  . ABDOMINAL HYSTERECTOMY  age 44   TAH and USO  Leiomyomata  . ANTERIOR CERVICAL CORPECTOMY N/A 06/11/2014   Procedure: ANTERIOR CERVICAL CORPECTOMY CERVICAL SIX; WITH CERVICAL FIVE TO CERVICAL SEVEN ARTHRODESIS;  Surgeon: Ashok Pall, MD;  Location: [place] [facility];  Service: Neurosurgery;  Laterality: N/A;  . BACK SURGERY  2008   lumb fusion  . BREAST BIOPSY Left 01/26/2014   Procedure: EXCISION LEFT BREAST MASS;  Surgeon: Adin Hector, MD;  Location: [place];  Service: General;  Laterality: Left;  . BREAST LUMPECTOMY WITH RADIOACTIVE SEED LOCALIZATION Left 07/24/2017   Procedure: LEFT BREAST LUMPECTOMY WITH RADIOACTIVE SEED LOCALIZATION ERAS;  Surgeon: Erroll Luna, MD;  Location: [place];  Service: General;  Laterality: Left;  . BREAST LUMPECTOMY WITH RADIOACTIVE SEED LOCALIZATION Left 01/13/2020  Procedure: LEFT BREAST LUMPECTOMY WITH RADIOACTIVE SEED LOCALIZATION;  Surgeon: Erroll Luna, MD;  Location: [place];  Service: General;  Laterality: Left;  . CARDIAC CATHETERIZATION    . CARDIAC CATHETERIZATION N/A 12/29/2014   Procedure: Left Heart Cath and Coronary Angiography;  Surgeon: Sherren Mocha, MD;  Location: [place] CV LAB;  Service: Cardiovascular;  Laterality: N/A;  . CAROTID ENDARTERECTOMY  march 2012   per Dr. Morton Amy  . CARPAL TUNNEL RELEASE Left 05/14/2014   Procedure: Left Carpal tunnel release;  Surgeon: Ashok Pall, MD;  Location: [place] NEUR[facility] ORS;  Service: Neurosurgery;  Laterality: Left;  Left Carpal tunnel release  . CARPAL TUNNEL RELEASE Bilateral   . CHOLECYSTECTOMY    . COLONOSCOPY  06/26/2017   per Dr. Henrene Pastor, adenomatous polyp, repeat in 5 yrs     . CORONARY STENT PLACEMENT     Dr. Burt Knack  . DILATION AND CURETTAGE OF UTERUS    . ESOPHAGOGASTRODUODENOSCOPY  02-08-06   per Dr. Henrene Pastor, gastritis   . EYE SURGERY Left June 2016   Cataract  . GLAUCOMA SURGERY     with laser, per Dr. Henrene Pastor   . HARDWARE REMOVAL  10/30/2016   Procedure: HARDWARE REMOVAL;  Surgeon: Jessy Oto, MD;  Location: [place];  Service: Orthopedics;;  . LUMBAR FUSION  2008  . POLYPECTOMY    . POSTERIOR LUMBAR FUSION  10/30/2016   Removal of hardware rods and pedicle screws lumbar four, Extension of fusion L4-5 to L5-S1 with reinsertion of screws at L 4, left L5-S1 transforaminal lumbar interbody fusion, Exploration  left L5 nerve root, bilateral decompressive laminectomy L3-4/notes 10/30/2016  . ULNAR NERVE TRANSPOSITION Left 05/14/2014   Procedure: Left Ulnar nerve decompression;  Surgeon: Ashok Pall, MD;  Location: [place] NE[facility] ORS;  Service: Neurosurgery;  Laterality: Left;  Left Ulnar nerve decompression    There were no vitals filed for this visit.   Subjective Assessment - 09/22/20 1059    Subjective Pt reports the massage the last visit was helpful in relieving her R upper shoulder and mid back pain    Patient Stated Goals to try to get better than what she is    Currently in Pain? Yes    Pain Score 3     Pain Location Neck    Pain Orientation Right    Pain Descriptors / Indicators Aching;Dull    Pain Type Chronic pain    Pain Radiating Towards down to right shoulder like a capital L    Pain Onset More than a month ago    Pain Frequency Intermittent    Aggravating Factors  turning head, looking up, lifting    Pain Relieving Factors heat    Effect of Pain on Daily Activities difficulty lifting weight, writing in calnedar on wall, looking up, turning head    Pain Location Thoracic   upper trap   Pain Orientation Mid    Pain Descriptors / Indicators Aching    Pain Type Chronic pain    Pain Radiating Towards From shoulder to mid back    Pain Onset More than  a month ago    Pain Frequency Intermittent    Aggravating Factors  walking    Pain Relieving Factors heat                             [facility] Adult PT Treatment/Exercise - 09/22/20 0001      Neck Exercises: Supine   Shoulder  ABduction 15 reps   2 sets   Shoulder Abduction Limitations red  band    Other Supine Exercise Shoulder ER 15x2    Other Supine Exercise red tband      Moist Heat Therapy   Number Minutes Moist Heat 15 Minutes    Moist Heat Location Cervical;Lumbar Spine      Neck Exercises: Stretches   Upper Trapezius Stretch Right;3 reps;30 seconds    Levator Stretch Right;3 reps;30 seconds    Other Neck Stretches Cervical to the L 5x, 10sec, gentle hand over presure    Other Neck Stretches cervical retraction 10x, 3 sec   VC and TC for proper technique                 PT Education - 09/22/20 1147    Education Details HEP revised    Person(s) Educated Patient    Methods Explanation;Demonstration;Tactile cues;Verbal cues;Handout    Comprehension Verbalized understanding;Returned demonstration;Verbal cues required;Tactile cues required            PT Short Term Goals - 09/22/20 1200      PT SHORT TERM GOAL #1   Title Pt will be independent and compliant with initial HEP.    Status Achieved    Target Date 09/22/20             PT Long Term Goals - 09/20/20 1433      PT LONG TERM GOAL #3   Title Pt will increase lumbar FOTO ability to 58% and neck to 62%, in order to demonstrate improvement in perceived level of functional ability.  09/20/20: low back 40%, neck 52%    Baseline 39% lumbar, 52% neck    Time 8    Period Weeks    Status On-going    Target Date 10/07/20                 Plan - 09/22/20 1152    Clinical Impression Statement Pt reported an improvement in R upper trap/mid back pain today following STM the last appt. Today's PT session focused on cervical ROM and posterior chain strengthening of the neck and upper  back. Pt's HEP was reviewed for understanding and appropriate application. Pt will benefit from continued PT to address ROM and posture, and for assist in the reduction of muscle tightness and pain.    Personal Factors and Comorbidities Comorbidity 3+;Time since onset of injury/illness/exacerbation;Past/Current Experience    Comorbidities Arthritis, DM, Glaucoma, heart murmur, HTN, HLD, stroke, cataracts    Examination-Activity Limitations Bend;Locomotion Level;Reach Overhead;Lift    Examination-Participation Restrictions Community Activity;Interpersonal Relationship;Cleaning;Laundry;Shop    Stability/Clinical Decision Making Evolving/Moderate complexity    Clinical Decision Making Moderate    Rehab Potential Fair    PT Frequency --   1-2x per week   PT Duration 8 weeks    PT Treatment/Interventions ADLs/Self Care Home Management;Cryotherapy;Electrical Stimulation;Iontophoresis 4mg /ml Dexamethasone;Moist Heat;Gait training;Stair training;Functional mobility training;Therapeutic activities;Therapeutic exercise;Balance training;Neuromuscular re-education;Patient/family education;Passive range of motion;Dry needling;Spinal Manipulations    PT Next Visit Plan Address body mechanics and tolerance for lifting.continue postural exercises/progress periscapular strengthening and neck+ thoracic ROM, gentle stretches, further dry needling as needed, further manual therpay for STM as needed    PT Home Exercise Plan [id number]- cervical rettraction and cervical rotation in supine    Consulted and Agree with Plan of Care Patient           Patient will benefit from skilled therapeutic intervention in order to improve the following deficits and impairments:  Difficulty walking,Decreased balance,Decreased  endurance,Decreased mobility,Postural dysfunction,Pain,Impaired flexibility,Increased fascial restricitons,Decreased strength,Decreased activity tolerance,Decreased range of motion,Impaired perceived functional  ability,Improper body mechanics  Visit Diagnosis: Strain of neck muscle, subsequent encounter  Thoracic myofascial strain, subsequent encounter  Poor posture  Difficulty in walking, not elsewhere classified     Problem List Patient Active Problem List   Diagnosis Date Noted  . Ductal carcinoma in situ (DCIS) of left breast 01/06/2020  . Right-sided thoracic back pain 07/01/2019  . Hyperlipidemia 07/17/2017  . History of lumbar spinal fusion   . History of fusion of lumbar spine   . Anemia due to blood loss 11/02/2016    Class: Acute  . Lethargy 10/31/2016  . AKI (acute kidney injury) ([place]) 10/31/2016  . Chronic diastolic CHF (congestive heart failure) ([place]) 10/31/2016  . Cough   . Leukocytosis   . Spondylolisthesis, lumbar region 10/30/2016    Class: Chronic  . Spinal stenosis of lumbar region 10/30/2016    Class: Chronic  . Retained orthopedic hardware 10/30/2016    Class: Chronic  . Spinal stenosis of lumbar region with neurogenic claudication 10/30/2016  . Genetic testing 09/14/2015  . Atypical ductal hyperplasia of left breast 08/24/2015  . Family history of breast cancer   . Family history of prostate cancer   . Family history of stomach cancer   . Abnormal nuclear stress test 12/29/2014  . Cervical spondylosis with myelopathy and radiculopathy 06/11/2014  . Carpal tunnel syndrome 05/14/2014  . Aftercare following surgery of the circulatory system, [place] 03/11/2014  . Pain of right lower extremity 03/11/2014  . Atypical lobular hyperplasia of left breast 02/12/2014  . Fibromyalgia 09/21/2013  . Carotid artery disease ([place]) 02/29/2012  . Back abscess 01/22/2012  . Chest pain with moderate risk of acute coronary syndrome 12/09/2009  . Shortness of breath 06/10/2009  . Orthostatic hypotension 01/31/2009  . CAD (coronary artery disease) 12/23/2008  . Normocytic anemia 07/21/2008  . Headache(784.0) 07/21/2008  . History of TIA (transient ischemic attack)  07/21/2008  . Edema 02/27/2008  . Diabetes mellitus with complication ([place]) 64/38/3818  . Depression 07/16/2007  . Dyslipidemia 02/21/2007  . Essential hypertension 02/21/2007  . Asthma 02/21/2007  . GERD 02/21/2007  . LOW BACK PAIN 02/21/2007    Gar Ponto MS, PT 09/22/20 12:12 PM  [facility] [address] [place], [name], [address] Phone: [phone]   Fax:  [phone]  Name: [name] MRN: [id number] Date of Birth: 12-Jun-1946

## 2020-09-27 ENCOUNTER — Ambulatory Visit: Payer: Medicare Other

## 2020-09-29 ENCOUNTER — Other Ambulatory Visit: Payer: Self-pay

## 2020-09-29 ENCOUNTER — Ambulatory Visit: Payer: Medicare Other

## 2020-09-29 DIAGNOSIS — S161XXD Strain of muscle, fascia and tendon at neck level, subsequent encounter: Secondary | ICD-10-CM | POA: Diagnosis not present

## 2020-09-29 DIAGNOSIS — R293 Abnormal posture: Secondary | ICD-10-CM | POA: Diagnosis not present

## 2020-09-29 DIAGNOSIS — S29019D Strain of muscle and tendon of unspecified wall of thorax, subsequent encounter: Secondary | ICD-10-CM

## 2020-09-29 DIAGNOSIS — R262 Difficulty in walking, not elsewhere classified: Secondary | ICD-10-CM | POA: Diagnosis not present

## 2020-09-29 NOTE — Therapy (Addendum)
[place], [name], [address] Phone: [phone]   Fax:  [phone]  Physical Therapy Treatment/Discharge  Patient Details  Name: [name] MRN: [id number] Date of Birth: [birth year] Referring Provider (PT): Laurey Morale, MD   Encounter Date: 09/29/2020   PT End of Session - 09/29/20 1030     Visit Number 8    Number of Visits 16    Date for PT Re-Evaluation 10/07/20    Authorization Type Medicare    Authorization Time Period FOTO visit #10    Progress Note Due on Visit 10    PT Start Time 1023   pt arrived late   PT Stop Time 1100    PT Time Calculation (min) 37 min    Equipment Utilized During Treatment Other (comment)   [place]   Activity Tolerance Patient tolerated treatment well    Behavior During Therapy [facility] for tasks assessed/performed             Past Medical History:  Diagnosis Date   Abnormal nuclear stress test 12/29/2014   Allergy    Anemia, unspecified    Arthritis    Blood transfusion without reported diagnosis    2018   CAD (coronary artery disease)    sees. Dr. Burt Knack. s/p cath with PCI of RCA (xience des) June 2010. Pt also with LAD and D2 dzs-NL FFR in both areas med Rx   Carotid artery occlusion    Cataract    sees Dr. Herbert Deaner    Depressive disorder, not elsewhere classified    Duodenal nodule    Edema    Fibromyalgia    Gastric polyps    COLON   GERD (gastroesophageal reflux disease)    Glaucoma    narrow angle, sees Dr. Shelly Flatten)    Hearing loss    left ear   Heart murmur    questionable   Hemorrhoids    HSV (herpes simplex virus) anogenital infection 05/2014   HTN (hypertension)    Hyperlipidemia    Low back pain    she sees Dr. Louanne Skye, also Dr. Ernestina Patches for pain medications and Benjiman Core PA for injections    Obesity    Osteopenia 01/2018   T score -1.6 FRAX 7.1% / 1.4%   Peripheral vascular disease ([place])    Stroke ([place])    TIA   "many yrs ago"     TIA (transient ischemic attack)    also Hx of it.    Type II or unspecified type diabetes mellitus without mention of complication, not stated as uncontrolled    sees Dr. Carrolyn Meiers     Past Surgical History:  Procedure Laterality Date   ABDOMINAL HYSTERECTOMY  age 44   TAH and USO  Leiomyomata   ANTERIOR CERVICAL CORPECTOMY N/A 06/11/2014   Procedure: ANTERIOR CERVICAL CORPECTOMY CERVICAL SIX; WITH CERVICAL FIVE TO CERVICAL SEVEN ARTHRODESIS;  Surgeon: Ashok Pall, MD;  Location: [place] [facility];  Service: Neurosurgery;  Laterality: N/A;   BACK SURGERY  2008   lumb fusion   BREAST BIOPSY Left 01/26/2014   Procedure: EXCISION LEFT BREAST MASS;  Surgeon: Adin Hector, MD;  Location: [place];  Service: General;  Laterality: Left;   BREAST LUMPECTOMY WITH RADIOACTIVE SEED LOCALIZATION Left 07/24/2017   Procedure: LEFT BREAST LUMPECTOMY WITH RADIOACTIVE SEED LOCALIZATION ERAS;  Surgeon: Erroll Luna, MD;  Location: [place];  Service: General;  Laterality: Left;   BREAST  LUMPECTOMY WITH RADIOACTIVE SEED LOCALIZATION Left 01/13/2020   Procedure: LEFT BREAST LUMPECTOMY WITH RADIOACTIVE SEED LOCALIZATION;  Surgeon: Erroll Luna, MD;  Location: [place];  Service: General;  Laterality: Left;   CARDIAC CATHETERIZATION     CARDIAC CATHETERIZATION N/A 12/29/2014   Procedure: Left Heart Cath and Coronary Angiography;  Surgeon: Sherren Mocha, MD;  Location: [place] CV LAB;  Service: Cardiovascular;  Laterality: N/A;   CAROTID ENDARTERECTOMY  march 2012   per Dr. Morton Amy   CARPAL TUNNEL RELEASE Left 05/14/2014   Procedure: Left Carpal tunnel release;  Surgeon: Ashok Pall, MD;  Location: [place] [facility]S;  Service: Neurosurgery;  Laterality: Left;  Left Carpal tunnel release   CARPAL TUNNEL RELEASE Bilateral    CHOLECYSTECTOMY     COLONOSCOPY  06/26/2017   per Dr. Henrene Pastor, adenomatous polyp, repeat in 5 yrs      CORONARY STENT PLACEMENT     Dr. Burt Knack   DILATION AND CURETTAGE OF UTERUS     ESOPHAGOGASTRODUODENOSCOPY  02-08-06   per Dr. Henrene Pastor, gastritis    EYE SURGERY Left June 2016   Cataract   GLAUCOMA SURGERY     with laser, per Dr. Henrene Pastor    HARDWARE REMOVAL  10/30/2016   Procedure: HARDWARE REMOVAL;  Surgeon: Jessy Oto, MD;  Location: [place];  Service: Orthopedics;;   LUMBAR FUSION  2008   POLYPECTOMY     POSTERIOR LUMBAR FUSION  10/30/2016   Removal of hardware rods and pedicle screws lumbar four, Extension of fusion L4-5 to L5-S1 with reinsertion of screws at L 4, left L5-S1 transforaminal lumbar interbody fusion, Exploration  left L5 nerve root, bilateral decompressive laminectomy L3-4/notes 10/30/2016   ULNAR NERVE TRANSPOSITION Left 05/14/2014   Procedure: Left Ulnar nerve decompression;  Surgeon: Ashok Pall, MD;  Location: [place] NE[facility] OR[facility];  Service: Neurosurgery;  Laterality: Left;  Left Ulnar nerve decompression    There were no vitals filed for this visit.                      [facility] Adult PT Treatment/Exercise - 09/29/20 0001       Neck Exercises: Machines for Strengthening   UBE (Upper Arm Bike) L1.5 6 min (3' fwd/3' bkwd)      Neck Exercises: Seated   Cervical Rotation 10 reps    Other Seated Exercise Horiz ABD, ER x15 with RTB      Neck Exercises: Supine   Shoulder ABduction 15 reps   2 sets   Shoulder Abduction Limitations red band    Other Supine Exercise Shoulder ER 15x2    Other Supine Exercise red tband      Neck Exercises: Stretches   Other Neck Stretches --                    PT Education - 09/29/20 1300     Education Details chin tucks reducing pressure on upper traps, sternal/chest lift to help shoulders relax down    Person(s) Educated Patient    Methods Explanation;Demonstration;Tactile cues;Verbal cues    Comprehension Verbalized understanding;Returned demonstration;Verbal cues required;Tactile cues required               PT Short Term Goals - 09/22/20 1200       PT SHORT TERM GOAL #1   Title Pt will be independent and compliant with initial HEP.    Status Achieved    Target Date 09/22/20  PT Long Term Goals - 09/20/20 1433       PT LONG TERM GOAL #3   Title Pt will increase lumbar FOTO ability to 58% and neck to 62%, in order to demonstrate improvement in perceived level of functional ability.  09/20/20: low back 40%, neck 52%    Baseline 39% lumbar, 52% neck    Time 8    Period Weeks    Status On-going    Target Date 10/07/20                   Plan - 09/29/20 1030     Clinical Impression Statement Pt responded well to treatment today, reducing any neck and R-sided pain, as well as addressing posture for improved cervical and shoulder positioning to reduce pressure on neck throughout day. Pt is doing better with less pain, and is on board with wrapping up POC next week, continuing HEP after last visit.    Personal Factors and Comorbidities Comorbidity 3+;Time since onset of injury/illness/exacerbation;Past/Current Experience    Comorbidities Arthritis, DM, Glaucoma, heart murmur, HTN, HLD, stroke, cataracts    Examination-Activity Limitations Bend;Locomotion Level;Reach Overhead;Lift    Examination-Participation Restrictions Community Activity;Interpersonal Relationship;Cleaning;Laundry;Shop    Stability/Clinical Decision Making Evolving/Moderate complexity    Rehab Potential Fair    PT Frequency --   1-2x per week   PT Duration 8 weeks    PT Treatment/Interventions ADLs/Self Care Home Management;Cryotherapy;Electrical Stimulation;Iontophoresis 59m/ml Dexamethasone;Moist Heat;Gait training;Stair training;Functional mobility training;Therapeutic activities;Therapeutic exercise;Balance training;Neuromuscular re-education;Patient/family education;Passive range of motion;Dry needling;Spinal Manipulations    PT Next Visit Plan Prep for DC. Continue ot address body  mechanics and tolerance for lifting. Continue postural exercises/progress periscapular strengthening and neck+ thoracic ROM, gentle stretches, further dry needling as needed, further manual therpay for STM as needed    PT Home Exercise Plan [id number]- cervical rettraction and cervical rotation in supine    Consulted and Agree with Plan of Care Patient             Patient will benefit from skilled therapeutic intervention in order to improve the following deficits and impairments:  Difficulty walking,Decreased balance,Decreased endurance,Decreased mobility,Postural dysfunction,Pain,Impaired flexibility,Increased fascial restricitons,Decreased strength,Decreased activity tolerance,Decreased range of motion,Impaired perceived functional ability,Improper body mechanics  Visit Diagnosis: Strain of neck muscle, subsequent encounter  Thoracic myofascial strain, subsequent encounter  Poor posture  Difficulty in walking, not elsewhere classified     Problem List Patient Active Problem List   Diagnosis Date Noted   Ductal carcinoma in situ (DCIS) of left breast 01/06/2020   Right-sided thoracic back pain 07/01/2019   Hyperlipidemia 07/17/2017   History of lumbar spinal fusion    History of fusion of lumbar spine    Anemia due to blood loss 11/02/2016    Class: Acute   Lethargy 10/31/2016   AKI (acute kidney injury) ([place] 10/31/2016   Chronic diastolic CHF (congestive heart failure) ([place] 10/31/2016   Cough    Leukocytosis    Spondylolisthesis, lumbar region 10/30/2016    Class: Chronic   Spinal stenosis of lumbar region 10/30/2016    Class: Chronic   Retained orthopedic hardware 10/30/2016    Class: Chronic   Spinal stenosis of lumbar region with neurogenic claudication 10/30/2016   Genetic testing 09/14/2015   Atypical ductal hyperplasia of left breast 08/24/2015   Family history of breast cancer    Family history of prostate cancer    Family history of stomach cancer     Abnormal nuclear stress test 12/29/2014   Cervical spondylosis with myelopathy  and radiculopathy 06/11/2014   Carpal tunnel syndrome 05/14/2014   Aftercare following surgery of the circulatory system, [place] 03/11/2014   Pain of right lower extremity 03/11/2014   Atypical lobular hyperplasia of left breast 02/12/2014   Fibromyalgia 09/21/2013   Carotid artery disease ([place]) 02/29/2012   Back abscess 01/22/2012   Chest pain with moderate risk of acute coronary syndrome 12/09/2009   Shortness of breath 06/10/2009   Orthostatic hypotension 01/31/2009   CAD (coronary artery disease) 12/23/2008   Normocytic anemia 07/21/2008   Headache(784.0) 07/21/2008   History of TIA (transient ischemic attack) 07/21/2008   Edema 02/27/2008   Diabetes mellitus with complication ([place]) 73/31/2508   Depression 07/16/2007   Dyslipidemia 02/21/2007   Essential hypertension 02/21/2007   Asthma 02/21/2007   GERD 02/21/2007   LOW BACK PAIN 02/21/2007    Izell Creola, PT, DPT 09/29/2020, 1:03 PM  [facility] [address] [place], [name], [address] Phone: [phone]   Fax:  [phone]  Name: [name] MRN: [id number] Date of Birth: [birth year]  PHYSICAL THERAPY DISCHARGE SUMMARY  Visits from Start of Care: 8  Current functional level related to goals / functional outcomes: See above   Remaining deficits: See above   Education / Equipment: HEP   Patient agrees to discharge. Patient goals were partially met. Patient is being discharged due to not returning since the last visit.  Allen Ralls MS, PT 02/04/21 8:14 AM

## 2020-09-30 DIAGNOSIS — M533 Sacrococcygeal disorders, not elsewhere classified: Secondary | ICD-10-CM | POA: Diagnosis not present

## 2020-10-04 ENCOUNTER — Ambulatory Visit: Payer: Medicare Other

## 2020-10-06 ENCOUNTER — Ambulatory Visit: Payer: Medicare Other

## 2020-10-10 ENCOUNTER — Telehealth: Payer: Medicare Other | Admitting: Family Medicine

## 2020-10-10 ENCOUNTER — Telehealth (INDEPENDENT_AMBULATORY_CARE_PROVIDER_SITE_OTHER): Payer: Medicare Other | Admitting: Family Medicine

## 2020-10-10 ENCOUNTER — Telehealth: Payer: Self-pay | Admitting: Family Medicine

## 2020-10-10 ENCOUNTER — Encounter: Payer: Self-pay | Admitting: Family Medicine

## 2020-10-10 DIAGNOSIS — J3089 Other allergic rhinitis: Secondary | ICD-10-CM

## 2020-10-10 DIAGNOSIS — I2510 Atherosclerotic heart disease of native coronary artery without angina pectoris: Secondary | ICD-10-CM

## 2020-10-10 MED ORDER — CETIRIZINE HCL 10 MG PO TABS: 10.0000 mg | ORAL_TABLET | Freq: Every day | ORAL | 11 refills | Status: DC

## 2020-10-10 NOTE — Progress Notes (Signed)
   Subjective:    Patient ID: [name], female    DOB: [birth year], 75 y.o.   MRN: [id number]  HPI Virtual Visit via Telephone Note  I connected with the patient on 10/10/20 at  3:00 PM EDT by telephone and verified that I am speaking with the correct person using two identifiers.   I discussed the limitations, risks, security and privacy concerns of performing an evaluation and management service by telephone and the availability of in person appointments. I also discussed with the patient that there may be a patient responsible charge related to this service. The patient expressed understanding and agreed to proceed.  Location patient: home Location provider: work or home office Participants present for the call: patient, provider Patient did not have a visit in the prior 7 days to address this/these issue(s).   History of Present Illness: Here for 4 days of itchy eyes, runny nose, and PND that gags her and makes her cough. No fever or ST. No body aches or SOB. No NVD.    Observations/Objective: Patient sounds cheerful and well on the phone. I do not appreciate any SOB. Speech and thought processing are grossly intact. Patient reported vitals:  Assessment and Plan: Allergies. She will take Zyrtec 10 mg BID for a few days until she feels better, then drop back to taking it once a day until the pollen count goes down. Drink plenty of water.  Alysia Penna, MD   Follow Up Instructions:     [phone] 5-10 [phone] 11-20 [phone] 21-30 I did not refer this patient for an OV in the next 24 hours for this/these issue(s).  I discussed the assessment and treatment plan with the patient. The patient was provided an opportunity to ask questions and all were answered. The patient agreed with the plan and demonstrated an understanding of the instructions.   The patient was advised to call back or seek an in-person evaluation if the symptoms worsen or if the condition fails to improve as  anticipated.  I provided 10 minutes of non-face-to-face time during this encounter.   Alysia Penna, MD    Review of Systems     Objective:   Physical Exam        Assessment & Plan:

## 2020-10-10 NOTE — Telephone Encounter (Signed)
Pt call and stated she want something call in for her cough also at  [place] ([place]), [place] Phone:  [phone]  Fax:  [phone]

## 2020-10-11 MED ORDER — HYDROCODONE-HOMATROPINE 5-1.5 MG/5ML PO SYRP: 5.0000 mL | ORAL_SOLUTION | ORAL | 0 refills | Status: DC | PRN

## 2020-10-11 NOTE — Telephone Encounter (Signed)
Done

## 2020-10-11 NOTE — Telephone Encounter (Signed)
Pt had a video visit on  10/10/2020. Pt was prescribed  allergy medications, Pt is requesting  for cough medication sent to her pharmacy. Please advise

## 2020-10-12 ENCOUNTER — Telehealth (INDEPENDENT_AMBULATORY_CARE_PROVIDER_SITE_OTHER): Payer: Medicare Other | Admitting: Family Medicine

## 2020-10-12 ENCOUNTER — Encounter: Payer: Self-pay | Admitting: Family Medicine

## 2020-10-12 ENCOUNTER — Other Ambulatory Visit: Payer: Self-pay

## 2020-10-12 DIAGNOSIS — N390 Urinary tract infection, site not specified: Secondary | ICD-10-CM

## 2020-10-12 DIAGNOSIS — R350 Frequency of micturition: Secondary | ICD-10-CM | POA: Diagnosis not present

## 2020-10-12 DIAGNOSIS — I2510 Atherosclerotic heart disease of native coronary artery without angina pectoris: Secondary | ICD-10-CM

## 2020-10-12 LAB — POC URINALSYSI DIPSTICK (AUTOMATED)
Bilirubin, UA: NEGATIVE
Blood, UA: NEGATIVE
Glucose, UA: POSITIVE — AB
Ketones, UA: NEGATIVE
Leukocytes, UA: NEGATIVE
Nitrite, UA: NEGATIVE
Protein, UA: POSITIVE — AB
Spec Grav, UA: 1.02 (ref 1.010–1.025)
Urobilinogen, UA: 0.2 E.U./dL
pH, UA: 6 (ref 5.0–8.0)

## 2020-10-12 MED ORDER — CIPROFLOXACIN HCL 500 MG PO TABS: 500.0000 mg | ORAL_TABLET | Freq: Two times a day (BID) | ORAL | 0 refills | Status: DC

## 2020-10-12 NOTE — Progress Notes (Signed)
   Subjective:    Patient ID: [name], female    DOB: [birth year], 75 y.o.   MRN: [id number]  HPI Virtual Visit via Telephone Note  I connected with the patient on 10/12/20 at  1:45 PM EDT by telephone and verified that I am speaking with the correct person using two identifiers.   I discussed the limitations, risks, security and privacy concerns of performing an evaluation and management service by telephone and the availability of in person appointments. I also discussed with the patient that there may be a patient responsible charge related to this service. The patient expressed understanding and agreed to proceed.  Location patient: home Location provider: work or home office Participants present for the call: patient, provider Patient did not have a visit in the prior 7 days to address this/these issue(s).   History of Present Illness: Here for the onset yesterday of increased frequency and urgency to urinate. No burning or pain or fever. She drinks plenty of water. Her A1c two months ago was 6.7, and her glucose this am was 100.    Observations/Objective: Patient sounds cheerful and well on the phone. I do not appreciate any SOB. Speech and thought processing are grossly intact. Patient reported vitals:  Assessment and Plan: She likely has a UTI. Treat with Cipro and culture the sample. Alysia Penna, MD   Follow Up Instructions:     [phone] 5-10 [phone] 11-20 [phone] 21-30 I did not refer this patient for an OV in the next 24 hours for this/these issue(s).  I discussed the assessment and treatment plan with the patient. The patient was provided an opportunity to ask questions and all were answered. The patient agreed with the plan and demonstrated an understanding of the instructions.   The patient was advised to call back or seek an in-person evaluation if the symptoms worsen or if the condition fails to improve as anticipated.  I provided 10 minutes of  non-face-to-face time during this encounter.   Alysia Penna, MD    Review of Systems     Objective:   Physical Exam        Assessment & Plan:

## 2020-10-12 NOTE — Addendum Note (Signed)
Addended by: Wyvonne Lenz on: 10/12/2020 02:30 PM   Modules accepted: Orders

## 2020-10-13 LAB — URINE CULTURE
MICRO NUMBER:: 11738389
SPECIMEN QUALITY:: ADEQUATE

## 2020-10-16 NOTE — Progress Notes (Signed)
error 

## 2020-10-20 ENCOUNTER — Telehealth: Payer: Self-pay

## 2020-10-20 NOTE — Telephone Encounter (Signed)
We have been following this patient for carotid stenosis - she is s/p endart in 2012. Patient called to report pain in right leg that has been occurring for about 2-3 months that keeps her up at night. It is not present during the day or when she walks. Says she sees little veins popping up but they do not hurt. There is no swelling. Spoke to provider, advised her to follow up with PCP and placed her on as a new varicose vein patient in June with [facility] with a right reflux study per provider recommendation.

## 2020-11-01 ENCOUNTER — Ambulatory Visit: Payer: Medicare Other | Admitting: Obstetrics and Gynecology

## 2020-11-01 ENCOUNTER — Ambulatory Visit: Payer: Medicare Other | Admitting: Family Medicine

## 2020-11-01 DIAGNOSIS — M4807 Spinal stenosis, lumbosacral region: Secondary | ICD-10-CM | POA: Diagnosis not present

## 2020-11-01 DIAGNOSIS — M47816 Spondylosis without myelopathy or radiculopathy, lumbar region: Secondary | ICD-10-CM | POA: Diagnosis not present

## 2020-11-01 DIAGNOSIS — G8929 Other chronic pain: Secondary | ICD-10-CM | POA: Diagnosis not present

## 2020-11-01 DIAGNOSIS — M545 Low back pain, unspecified: Secondary | ICD-10-CM | POA: Diagnosis not present

## 2020-11-03 ENCOUNTER — Encounter: Payer: Self-pay | Admitting: Family Medicine

## 2020-11-03 ENCOUNTER — Other Ambulatory Visit: Payer: Self-pay

## 2020-11-03 ENCOUNTER — Ambulatory Visit (INDEPENDENT_AMBULATORY_CARE_PROVIDER_SITE_OTHER): Payer: Medicare Other | Admitting: Family Medicine

## 2020-11-03 VITALS — BP 120/60 | HR 87 | Temp 98.2°F | Wt 171.8 lb

## 2020-11-03 DIAGNOSIS — I2510 Atherosclerotic heart disease of native coronary artery without angina pectoris: Secondary | ICD-10-CM

## 2020-11-03 DIAGNOSIS — L853 Xerosis cutis: Secondary | ICD-10-CM

## 2020-11-03 DIAGNOSIS — I8393 Asymptomatic varicose veins of bilateral lower extremities: Secondary | ICD-10-CM | POA: Diagnosis not present

## 2020-11-03 DIAGNOSIS — D179 Benign lipomatous neoplasm, unspecified: Secondary | ICD-10-CM

## 2020-11-03 DIAGNOSIS — L700 Acne vulgaris: Secondary | ICD-10-CM

## 2020-11-03 DIAGNOSIS — B379 Candidiasis, unspecified: Secondary | ICD-10-CM

## 2020-11-03 DIAGNOSIS — E118 Type 2 diabetes mellitus with unspecified complications: Secondary | ICD-10-CM

## 2020-11-03 NOTE — Progress Notes (Signed)
GYNECOLOGY  VISIT   HPI: 75 y.o.   Married  [place] American  female   [phone] with No LMP recorded. Patient has had a hysterectomy.   here for right vulvar lesion for 8 - 12 months.  Can be painful.  Does not need to take pain medication.  It is itching.  No drainage.  Not self treating.   Taking [place].  She has an insulin pump. A1C 6.7. Sees Dr. Reynold Bowen.   She has a history of a vulvar and buttock abscesses in 2020 treated with drainage and abx.   GYNECOLOGIC HISTORY: No LMP recorded. Patient has had a hysterectomy. Contraception:  TAH Menopausal hormone therapy:  none Last mammogram:  08-31-20 3D Diag.Bil/Neg/Birads2 Last pap smear:  2011 normal per hx        OB History    Gravida  2   Para  1   Term  1   Preterm      AB  1   Living  1     SAB  1   IAB      Ectopic      Multiple      Live Births                 Patient Active Problem List   Diagnosis Date Noted  . Asymptomatic varicose veins of both lower extremities 11/04/2020  . Ductal carcinoma in situ (DCIS) of left breast 01/06/2020  . Right-sided thoracic back pain 07/01/2019  . Hyperlipidemia 07/17/2017  . History of lumbar spinal fusion   . History of fusion of lumbar spine   . Anemia due to blood loss 11/02/2016    Class: Acute  . Lethargy 10/31/2016  . AKI (acute kidney injury) ([place]) 10/31/2016  . Chronic diastolic CHF (congestive heart failure) ([place]) 10/31/2016  . Cough   . Leukocytosis   . Spondylolisthesis, lumbar region 10/30/2016    Class: Chronic  . Spinal stenosis of lumbar region 10/30/2016    Class: Chronic  . Retained orthopedic hardware 10/30/2016    Class: Chronic  . Spinal stenosis of lumbar region with neurogenic claudication 10/30/2016  . Genetic testing 09/14/2015  . Atypical ductal hyperplasia of left breast 08/24/2015  . Family history of breast cancer   . Family history of prostate cancer   . Family history of stomach cancer   . Abnormal nuclear  stress test 12/29/2014  . Cervical spondylosis with myelopathy and radiculopathy 06/11/2014  . Carpal tunnel syndrome 05/14/2014  . Aftercare following surgery of the circulatory system, [place] 03/11/2014  . Pain of right lower extremity 03/11/2014  . Atypical lobular hyperplasia of left breast 02/12/2014  . Fibromyalgia 09/21/2013  . Carotid artery disease ([place]) 02/29/2012  . Back abscess 01/22/2012  . Chest pain with moderate risk of acute coronary syndrome 12/09/2009  . Shortness of breath 06/10/2009  . Orthostatic hypotension 01/31/2009  . CAD (coronary artery disease) 12/23/2008  . Normocytic anemia 07/21/2008  . Headache(784.0) 07/21/2008  . History of TIA (transient ischemic attack) 07/21/2008  . Edema 02/27/2008  . Diabetes mellitus with complication ([place]) 07/17/3233  . Depression 07/16/2007  . Dyslipidemia 02/21/2007  . Essential hypertension 02/21/2007  . Asthma 02/21/2007  . GERD 02/21/2007  . LOW BACK PAIN 02/21/2007    Past Medical History:  Diagnosis Date  . Abnormal nuclear stress test 12/29/2014  . Allergy   . Anemia, unspecified   . Arthritis   . Blood transfusion without reported diagnosis    2018  . CAD (  coronary artery disease)    sees. Dr. Burt Knack. s/p cath with PCI of RCA (xience des) June 2010. Pt also with LAD and D2 dzs-NL FFR in both areas med Rx  . Carotid artery occlusion   . Cataract    sees Dr. Herbert Deaner   . Depressive disorder, not elsewhere classified   . Duodenal nodule   . Edema   . Fibromyalgia   . Gastric polyps    COLON  . GERD (gastroesophageal reflux disease)   . Glaucoma    narrow angle, sees Dr. Herbert Deaner   . Headache(784.0)   . Hearing loss    left ear  . Heart murmur    questionable  . Hemorrhoids   . HSV (herpes simplex virus) anogenital infection 05/2014  . HTN (hypertension)   . Hyperlipidemia   . Low back pain    she sees Dr. Louanne Skye, also Dr. Ernestina Patches for pain medications and Benjiman Core PA for injections   . Obesity   .  Osteopenia 01/2018   T score -1.6 FRAX 7.1% / 1.4%  . Peripheral vascular disease ([place])   . Stroke [facility])    TIA  "many yrs ago"    . TIA (transient ischemic attack)    also Hx of it.   . Type II or unspecified type diabetes mellitus without mention of complication, not stated as uncontrolled    sees Dr. Carrolyn Meiers     Past Surgical History:  Procedure Laterality Date  . ABDOMINAL HYSTERECTOMY  age 89   TAH and USO  Leiomyomata  . ANTERIOR CERVICAL CORPECTOMY N/A 06/11/2014   Procedure: ANTERIOR CERVICAL CORPECTOMY CERVICAL SIX; WITH CERVICAL FIVE TO CERVICAL SEVEN ARTHRODESIS;  Surgeon: Ashok Pall, MD;  Location: [facility];  Service: Neurosurgery;  Laterality: N/A;  . BACK SURGERY  2008   lumb fusion  . BREAST BIOPSY Left 01/26/2014   Procedure: EXCISION LEFT BREAST MASS;  Surgeon: Adin Hector, MD;  Location: [facility];  Service: General;  Laterality: Left;  . BREAST LUMPECTOMY WITH RADIOACTIVE SEED LOCALIZATION Left 07/24/2017   Procedure: LEFT BREAST LUMPECTOMY WITH RADIOACTIVE SEED LOCALIZATION ERAS;  Surgeon: Erroll Luna, MD;  Location: [facility];  Service: General;  Laterality: Left;  . BREAST LUMPECTOMY WITH RADIOACTIVE SEED LOCALIZATION Left 01/13/2020   Procedure: LEFT BREAST LUMPECTOMY WITH RADIOACTIVE SEED LOCALIZATION;  Surgeon: Erroll Luna, MD;  Location: [facility];  Service: General;  Laterality: Left;  . CARDIAC CATHETERIZATION    . CARDIAC CATHETERIZATION N/A 12/29/2014   Procedure: Left Heart Cath and Coronary Angiography;  Surgeon: Sherren Mocha, MD;  Location: [facility];  Service: Cardiovascular;  Laterality: N/A;  . CAROTID ENDARTERECTOMY  march 2012   per Dr. Morton Amy  . CARPAL TUNNEL RELEASE Left 05/14/2014   Procedure: Left Carpal tunnel release;  Surgeon: Ashok Pall, MD;  Location: [facility];  Service: Neurosurgery;  Laterality: Left;  Left Carpal tunnel release  . CARPAL TUNNEL  RELEASE Bilateral   . CHOLECYSTECTOMY    . COLONOSCOPY  06/26/2017   per Dr. Henrene Pastor, adenomatous polyp, repeat in 5 yrs    . CORONARY STENT PLACEMENT     Dr. Burt Knack  . DILATION AND CURETTAGE OF UTERUS    . ESOPHAGOGASTRODUODENOSCOPY  02-08-06   per Dr. Henrene Pastor, gastritis   . EYE SURGERY Left June 2016   Cataract  . GLAUCOMA SURGERY     with laser, per Dr. Henrene Pastor   . HARDWARE REMOVAL  10/30/2016  Procedure: HARDWARE REMOVAL;  Surgeon: Jessy Oto, MD;  Location: [facility];  Service: Orthopedics;;  . LUMBAR FUSION  2008  . POLYPECTOMY    . POSTERIOR LUMBAR FUSION  10/30/2016   Removal of hardware rods and pedicle screws lumbar four, Extension of fusion L4-5 to L5-S1 with reinsertion of screws at L 4, left L5-S1 transforaminal lumbar interbody fusion, Exploration  left L5 nerve root, bilateral decompressive laminectomy L3-4/notes 10/30/2016  . ULNAR NERVE TRANSPOSITION Left 05/14/2014   Procedure: Left Ulnar nerve decompression;  Surgeon: Ashok Pall, MD;  Location: [facility];  Service: Neurosurgery;  Laterality: Left;  Left Ulnar nerve decompression    Current Outpatient Medications  Medication Sig Dispense Refill  . ACCU-CHEK GUIDE test strip 1 each by Other route in the morning and at bedtime.    [place] Kitchen aspirin EC 81 MG tablet Take 1 tablet (81 mg total) by mouth daily. Swallow whole. 90 tablet 3  . cetirizine (ZYRTEC) 10 MG tablet Take 1 tablet (10 mg total) by mouth daily. 30 tablet 11  . cyclobenzaprine (FLEXERIL) 10 MG tablet Take 1 tablet (10 mg total) by mouth 3 (three) times daily as needed for muscle spasms. 90 tablet 2  . diclofenac sodium (VOLTAREN) 1 % GEL Apply 2 g topically 4 (four) times daily as needed (for pain).    Marland Kitchen ergocalciferol (VITAMIN D2) 50000 UNITS capsule Take 50,000 Units by mouth every Saturday. On Saturday.    Marland Kitchen FARXIGA 10 MG TABS tablet Take 10 mg by mouth every morning.    . furosemide (LASIX) 40 MG tablet Take 40 mg by mouth daily.    Marland Kitchen HUMALOG 100 UNIT/ML  injection     . hydrALAZINE (APRESOLINE) 100 MG tablet TAKE 1 TABLET BY MOUTH THREE TIMES DAILY 270 tablet 2  . HYDROcodone-homatropine (HYCODAN) 5-1.5 MG/5ML syrup Take 5 mLs by mouth every 4 (four) hours as needed for cough. 240 mL 0  . ibuprofen (ADVIL) 600 MG tablet Take 1 tablet (600 mg total) by mouth every 6 (six) hours as needed. 30 tablet 0  . Insulin Disposable Pump (V-GO 40) KIT Inject 1 application into the skin See admin instructions.    . Insulin Human (INSULIN PUMP) SOLN Inject into the skin. Uses Novolog insulin for pump    . irbesartan (AVAPRO) 300 MG tablet TAKE 1 TABLET BY MOUTH ONCE DAILY . APPOINTMENT REQUIRED FOR FUTURE REFILLS 90 tablet 3  . isosorbide mononitrate (IMDUR) 60 MG 24 hr tablet Take 1.5 tablets (90 mg total) by mouth daily. 135 tablet 2  . latanoprost (XALATAN) 0.005 % ophthalmic solution Place 1 drop into both eyes at bedtime.     . metoprolol succinate (TOPROL-XL) 100 MG 24 hr tablet Take 1 tablet (100 mg total) by mouth daily. Pt needs to keep upcoming appt for further refills 90 tablet 1  . montelukast (SINGULAIR) 10 MG tablet Take 10 mg by mouth daily.    . nitroGLYCERIN (NITROSTAT) 0.4 MG SL tablet Place 1 tablet (0.4 mg total) under the tongue every 5 (five) minutes as needed for chest pain. 25 tablet 1  . nystatin-triamcinolone (MYCOLOG II) cream Apply 1 application topically 2 (two) times daily. 30 g 2  . OZEMPIC, 1 MG/DOSE, 4 MG/3ML SOPN Inject 1 mg into the skin once a week.    . pantoprazole (PROTONIX) 40 MG tablet Take 1 tablet by mouth once daily 90 tablet 0  . rosuvastatin (CRESTOR) 40 MG tablet Take 1 tablet by mouth once daily 90 tablet 0  .  spironolactone (ALDACTONE) 50 MG tablet Take 1 tablet by mouth once daily 90 tablet 2   No current facility-administered medications for this visit.     ALLERGIES: Patient has no known allergies.  Family History  Problem Relation Age of Onset  . Hypertension Mother   . Hypertension Sister   .  Hyperlipidemia Sister   . Hypertension Brother   . Cancer Brother        PROSTATE/LUNG  . Diabetes Brother   . Heart disease Brother        Before age 4 - Bypass  . Varicose Veins Brother   . Cancer Father        Prostate and pancreatic   . Stomach cancer Maternal Grandmother 22  . Heart disease Maternal Grandfather   . Breast cancer Maternal Aunt 42  . Cancer Maternal Aunt        Stomach  . Breast cancer Cousin 69       maternal first cousin  . Prostate cancer Cousin 56  . Breast cancer Cousin 8  . Esophageal cancer Cousin   . Breast cancer Cousin 40       maternal first cousin's daughter  . Coronary artery disease Neg Hx        premature CAD  . Colon cancer Neg Hx   . Colon polyps Neg Hx   . Rectal cancer Neg Hx     Social History   Socioeconomic History  . Marital status: Married    Spouse name: Not on file  . Number of children: 1  . Years of education: 80  . Highest education level: Bachelor's degree (e.g., BA, AB, BS)  Occupational History  . Not on file  Tobacco Use  . Smoking status: Former Smoker    Quit date: 01/22/1967    Years since quitting: 53.8  . Smokeless tobacco: Never Used  Vaping Use  . Vaping Use: Never used  Substance and Sexual Activity  . Alcohol use: No    Alcohol/week: 0.0 standard drinks  . Drug use: No  . Sexual activity: Not Currently    Birth control/protection: Surgical, Post-menopausal    Comment: HYST-1st intercourse 33 yo-5 partners  Other Topics Concern  . Not on file  Social History Narrative  . Not on file   Social Determinants of Health   Financial Resource Strain: Not on file  Food Insecurity: Not on file  Transportation Needs: Not on file  Physical Activity: Not on file  Stress: Not on file  Social Connections: Not on file  Intimate Partner Violence: Not on file    Review of Systems  All other systems reviewed and are negative.   PHYSICAL EXAMINATION:    BP 120/64 (Cuff Size: Large)   Pulse 68   SpO2  98%     General appearance: alert, cooperative and appears stated age   Pelvic: External genitalia:  Rosy coloration of the skin of the vulva.  7 - 8 mm inferior labia majora sebaceous cyst.               Urethra:  normal appearing urethra with no masses, tenderness or lesions              Bartholins and Skenes: normal                 Vagina: normal appearing vagina with normal color and discharge, no lesions              Cervix: Absent.  Bimanual Exam:  Uterus:  Absent.              Adnexa: no mass, fullness, tenderness             Chaperone was present for exam.  ASSESSMENT  Acute vulvitis. Sebaceous cyst of the labia.  No abscess. DM.  Has an insulin pump. Slightly elevated creatinine and lower GFR. DCIS of left breast.   PLAN  Wet prep:  Positive yeast, negative clue cells, negative trichomonas.  Diflucan 150 mg po x 1.  May repeat in 72 hours prn.  Discussion of sebaceous cysts.  No treatment of this needed today.  I recommend using loose clothing and avoid manipulation of the cyst, which can cause infection.  Fu prn.    31 min  total time was spent for this patient encounter, including preparation, face-to-face counseling with the patient, coordination of care, and documentation of the encounter.

## 2020-11-03 NOTE — Progress Notes (Signed)
   Subjective:    Patient ID: [name], female    DOB: 1945-11-03, 75 y.o.   MRN: [id number]  HPI Here for a number of issues. First she asks for refills on insulin. Second she asks about swelling of some blood vessels in the legs she noticed a month ago. These are not painful. Third she has had lumps on both wrists for years and asks about these. They do not bother her. Fourth she has very dry skin and OTC moisturizers have not helped. Fifth she asks me to check a lump on the back that gets sore at times. Sixth she asks me to check a line of skin in the lower abdomen that gets sore at times. She is applying neosporin with no relief.    Review of Systems  Constitutional: Negative.   Respiratory: Negative.   Cardiovascular: Negative.   Skin: Positive for rash.       Objective:   Physical Exam Constitutional:      Appearance: Normal appearance. She is obese.  Cardiovascular:     Rate and Rhythm: Normal rate and regular rhythm.     Pulses: Normal pulses.     Heart sounds: Normal heart sounds.  Pulmonary:     Effort: Pulmonary effort is normal.     Breath sounds: Normal breath sounds.  Skin:    Comments: There is a large blackhead comedone on the back between the shoulder blades. There are small mobile non-tender firm lumps under the skin on both ulnar wrists. There is a rim of red macerated skin across the lower abdomen just at the edge of the pannus. Her skin all over the body appears dry and flaky. There are numerous small non-tender superficial varicosities on both lower legs.   Neurological:     Mental Status: She is alert.           Assessment & Plan:  As far as her insulin, her diabetes is managed by Dr. Carrolyn Meiers, so I advised her to check with him about refills. For her dry skin, she can try Cerave lotion OTC. The lipomas on her wrists are benign and do not require treatment. There is a rim of Candidiasis on the lower abdomen, and she can apply Ketconazole  cream for this. She has varicose veins in the legs and she can wear compression stockings for these if she wishes. The comedone in the back is benign and no treatment is needed. We spent 45 minutes discussing all these issues today. Alysia Penna, MD

## 2020-11-04 DIAGNOSIS — I8393 Asymptomatic varicose veins of bilateral lower extremities: Secondary | ICD-10-CM | POA: Insufficient documentation

## 2020-11-07 ENCOUNTER — Ambulatory Visit (INDEPENDENT_AMBULATORY_CARE_PROVIDER_SITE_OTHER): Payer: Medicare Other | Admitting: Obstetrics and Gynecology

## 2020-11-07 ENCOUNTER — Other Ambulatory Visit: Payer: Self-pay

## 2020-11-07 ENCOUNTER — Encounter: Payer: Self-pay | Admitting: Obstetrics and Gynecology

## 2020-11-07 VITALS — BP 120/64 | HR 68

## 2020-11-07 DIAGNOSIS — N762 Acute vulvitis: Secondary | ICD-10-CM

## 2020-11-07 DIAGNOSIS — N907 Vulvar cyst: Secondary | ICD-10-CM

## 2020-11-07 LAB — WET PREP FOR TRICH, YEAST, CLUE

## 2020-11-07 MED ORDER — FLUCONAZOLE 150 MG PO TABS: 150.0000 mg | ORAL_TABLET | Freq: Once | ORAL | 0 refills | Status: AC

## 2020-11-07 NOTE — Patient Instructions (Addendum)
Epidermoid Cyst  An epidermoid cyst, also called an epidermal cyst, is a small lump under your skin. The cyst contains a substance called keratin. Do not try to pop or open the cyst yourself. What are the causes?  A blocked hair follicle.  A hair that curls and re-enters the skin instead of growing straight out of the skin.  A blocked pore.  Irritated skin.  An injury to the skin.  Certain conditions that are passed along from parent to child.  Human papillomavirus (HPV). This happens rarely when cysts occur on the bottom of the feet.  Long-term sun damage to the skin. What increases the risk?  Having acne.  Being female.  Having an injury to the skin.  Being past puberty.  Having certain conditions caused by genes (genetic disorder) What are the signs or symptoms? These cysts are usually harmless, but they can get infected. Symptoms of infection may include:  Redness.  Inflammation.  Tenderness.  Warmth.  Fever.  A bad-smelling substance that drains from the cyst.  Pus that drains from the cyst. How is this treated? In many cases, epidermoid cysts go away on their own without treatment. If a cyst becomes infected, treatment may include:  Opening and draining the cyst, done by a doctor. After draining, you may need minor surgery to remove the rest of the cyst.  Antibiotic medicine.  Shots of medicines (steroids) that help to reduce inflammation.  Surgery to remove the cyst. Surgery may be done if the cyst: ? Becomes large. ? Bothers you. ? Has a chance of turning into cancer.  Do not try to open a cyst yourself. Follow these instructions at home: Medicines  Take over-the-counter and prescription medicines as told by your doctor.  If you were prescribed an antibiotic medicine, take it as told by your doctor. Do not stop taking it even if you start to feel better. General instructions  Keep the area around your cyst clean and dry.  Wear loose, dry  clothing.  Avoid touching your cyst.  Check your cyst every day for signs of infection. Check for: ? Redness, swelling, or pain. ? Fluid or blood. ? Warmth. ? Pus or a bad smell.  Keep all follow-up visits. How is this prevented?  Wear clean, dry, clothing.  Avoid wearing tight clothing.  Keep your skin clean and dry. Take showers or baths every day. Contact a doctor if:  Your cyst has symptoms of infection.  Your condition does not improve or gets worse.  You have a cyst that looks different from other cysts you have had.  You have a fever. Get help right away if:  Redness spreads from the cyst into the area close by. Summary  An epidermoid cyst is a small lump under your skin.  If a cyst becomes infected, treatment may include surgery to open and drain the cyst, or to remove it.  Take over-the-counter and prescription medicines only as told by your doctor.  Contact a doctor if your condition is not improving or is getting worse.  Keep all follow-up visits. This information is not intended to replace advice given to you by your health care provider. Make sure you discuss any questions you have with your health care provider. Document Revised: 09/30/2019 Document Reviewed: 09/30/2019 Elsevier Patient Education  2021 [place].  Vaginal Yeast Infection, Adult  Vaginal yeast infection is a condition that causes vaginal discharge as well as soreness, swelling, and redness (inflammation) of the vagina. This is a  common condition. Some women get this infection frequently. What are the causes? This condition is caused by a change in the normal balance of the yeast (candida) and bacteria that live in the vagina. This change causes an overgrowth of yeast, which causes the inflammation. What increases the risk? The condition is more likely to develop in women who:  Take antibiotic medicines.  Have diabetes.  Take birth control pills.  Are pregnant.  Douche  often.  Have a weak body defense system (immune system).  Have been taking steroid medicines for a long time.  Frequently wear tight clothing. What are the signs or symptoms? Symptoms of this condition include:  White, thick, creamy vaginal discharge.  Swelling, itching, redness, and irritation of the vagina. The lips of the vagina (vulva) may be affected as well.  Pain or a burning feeling while urinating.  Pain during sex. How is this diagnosed? This condition is diagnosed based on:  Your medical history.  A physical exam.  A pelvic exam. Your health care provider will examine a sample of your vaginal discharge under a microscope. Your health care provider may send this sample for testing to confirm the diagnosis. How is this treated? This condition is treated with medicine. Medicines may be over-the-counter or prescription. You may be told to use one or more of the following:  Medicine that is taken by mouth (orally).  Medicine that is applied as a cream (topically).  Medicine that is inserted directly into the vagina (suppository). Follow these instructions at home: Lifestyle  Do not have sex until your health care provider approves. Tell your sex partner that you have a yeast infection. That person should go to his or her health care provider and ask if they should also be treated.  Do not wear tight clothes, such as pantyhose or tight pants.  Wear breathable cotton underwear. General instructions  Take or apply over-the-counter and prescription medicines only as told by your health care provider.  Eat more yogurt. This may help to keep your yeast infection from returning.  Do not use tampons until your health care provider approves.  Try taking a sitz bath to help with discomfort. This is a warm water bath that is taken while you are sitting down. The water should only come up to your hips and should cover your buttocks. Do this 3-4 times per day or as told by  your health care provider.  Do not douche.  If you have diabetes, keep your blood sugar levels under control.  Keep all follow-up visits as told by your health care provider. This is important.   Contact a health care provider if:  You have a fever.  Your symptoms go away and then return.  Your symptoms do not get better with treatment.  Your symptoms get worse.  You have new symptoms.  You develop blisters in or around your vagina.  You have blood coming from your vagina and it is not your menstrual period.  You develop pain in your abdomen. Summary  Vaginal yeast infection is a condition that causes discharge as well as soreness, swelling, and redness (inflammation) of the vagina.  This condition is treated with medicine. Medicines may be over-the-counter or prescription.  Take or apply over-the-counter and prescription medicines only as told by your health care provider.  Do not douche. Do not have sex or use tampons until your health care provider approves.  Contact a health care provider if your symptoms do not get better with  treatment or your symptoms go away and then return. This information is not intended to replace advice given to you by your health care provider. Make sure you discuss any questions you have with your health care provider. Document Revised: 01/23/2019 Document Reviewed: 11/11/2017 Elsevier Patient Education  [place].

## 2020-11-08 ENCOUNTER — Other Ambulatory Visit: Payer: Self-pay | Admitting: Physician Assistant

## 2020-11-08 DIAGNOSIS — M47816 Spondylosis without myelopathy or radiculopathy, lumbar region: Secondary | ICD-10-CM

## 2020-11-22 ENCOUNTER — Ambulatory Visit
Admission: RE | Admit: 2020-11-22 | Discharge: 2020-11-22 | Disposition: A | Discharge status: Home / Self Care | Payer: Medicare Other | Source: Ambulatory Visit | Attending: Physician Assistant | Admitting: Physician Assistant

## 2020-11-22 DIAGNOSIS — M47816 Spondylosis without myelopathy or radiculopathy, lumbar region: Secondary | ICD-10-CM

## 2020-11-22 DIAGNOSIS — M545 Low back pain, unspecified: Secondary | ICD-10-CM | POA: Diagnosis not present

## 2020-11-22 DIAGNOSIS — M48061 Spinal stenosis, lumbar region without neurogenic claudication: Secondary | ICD-10-CM | POA: Diagnosis not present

## 2020-11-24 ENCOUNTER — Ambulatory Visit
Admission: RE | Admit: 2020-11-24 | Discharge: 2020-11-24 | Disposition: A | Discharge status: Home / Self Care | Payer: Medicare Other | Source: Ambulatory Visit | Attending: Nurse Practitioner | Admitting: Nurse Practitioner

## 2020-11-24 DIAGNOSIS — D0512 Intraductal carcinoma in situ of left breast: Secondary | ICD-10-CM

## 2020-11-24 DIAGNOSIS — N6489 Other specified disorders of breast: Secondary | ICD-10-CM | POA: Diagnosis not present

## 2020-11-24 MED ORDER — GADOBUTROL 1 MMOL/ML IV SOLN
8.0000 mL | Freq: Once | INTRAVENOUS | Status: AC | PRN
  Administered 2020-11-24: 8 mL via INTRAVENOUS

## 2020-12-01 NOTE — Telephone Encounter (Signed)
-----   Message from Alla Feeling, NP sent at 11/30/2020  1:37 PM EDT ----- Please let her know Dr. Burr Medico and I have reviewed breast MRI, which shows normal/expected post-op and post-radiation changes, no evidence of cancer. Great news.  Thanks, Regan Rakers, NP

## 2020-12-01 NOTE — Telephone Encounter (Signed)
I called MS Junious and reviewed Cira Rue, NP comments.  Ms Racette verbalized undertanding

## 2020-12-02 DIAGNOSIS — M47816 Spondylosis without myelopathy or radiculopathy, lumbar region: Secondary | ICD-10-CM | POA: Diagnosis not present

## 2020-12-22 DIAGNOSIS — R350 Frequency of micturition: Secondary | ICD-10-CM | POA: Diagnosis not present

## 2020-12-22 DIAGNOSIS — R351 Nocturia: Secondary | ICD-10-CM | POA: Diagnosis not present

## 2020-12-22 DIAGNOSIS — R3913 Splitting of urinary stream: Secondary | ICD-10-CM | POA: Diagnosis not present

## 2020-12-30 ENCOUNTER — Other Ambulatory Visit: Payer: Self-pay | Admitting: Cardiovascular Disease

## 2020-12-30 ENCOUNTER — Other Ambulatory Visit: Payer: Self-pay | Admitting: Physician Assistant

## 2020-12-30 ENCOUNTER — Other Ambulatory Visit: Payer: Self-pay | Admitting: Family Medicine

## 2021-01-02 DIAGNOSIS — M549 Dorsalgia, unspecified: Secondary | ICD-10-CM | POA: Diagnosis not present

## 2021-01-02 DIAGNOSIS — M48061 Spinal stenosis, lumbar region without neurogenic claudication: Secondary | ICD-10-CM | POA: Diagnosis not present

## 2021-01-02 DIAGNOSIS — M47814 Spondylosis without myelopathy or radiculopathy, thoracic region: Secondary | ICD-10-CM | POA: Diagnosis not present

## 2021-01-02 DIAGNOSIS — M4326 Fusion of spine, lumbar region: Secondary | ICD-10-CM | POA: Diagnosis not present

## 2021-01-02 DIAGNOSIS — M47812 Spondylosis without myelopathy or radiculopathy, cervical region: Secondary | ICD-10-CM | POA: Diagnosis not present

## 2021-01-02 DIAGNOSIS — M545 Low back pain, unspecified: Secondary | ICD-10-CM | POA: Diagnosis not present

## 2021-01-02 DIAGNOSIS — M5134 Other intervertebral disc degeneration, thoracic region: Secondary | ICD-10-CM | POA: Diagnosis not present

## 2021-01-02 DIAGNOSIS — M5136 Other intervertebral disc degeneration, lumbar region: Secondary | ICD-10-CM | POA: Diagnosis not present

## 2021-01-02 DIAGNOSIS — M47816 Spondylosis without myelopathy or radiculopathy, lumbar region: Secondary | ICD-10-CM | POA: Diagnosis not present

## 2021-01-02 DIAGNOSIS — M4316 Spondylolisthesis, lumbar region: Secondary | ICD-10-CM | POA: Diagnosis not present

## 2021-01-02 DIAGNOSIS — M5030 Other cervical disc degeneration, unspecified cervical region: Secondary | ICD-10-CM | POA: Diagnosis not present

## 2021-01-02 DIAGNOSIS — M40204 Unspecified kyphosis, thoracic region: Secondary | ICD-10-CM | POA: Diagnosis not present

## 2021-01-02 DIAGNOSIS — M21162 Varus deformity, not elsewhere classified, left knee: Secondary | ICD-10-CM | POA: Diagnosis not present

## 2021-01-02 DIAGNOSIS — Z981 Arthrodesis status: Secondary | ICD-10-CM | POA: Diagnosis not present

## 2021-01-02 DIAGNOSIS — Z79899 Other long term (current) drug therapy: Secondary | ICD-10-CM | POA: Diagnosis not present

## 2021-01-02 DIAGNOSIS — M21161 Varus deformity, not elsewhere classified, right knee: Secondary | ICD-10-CM | POA: Diagnosis not present

## 2021-01-04 DIAGNOSIS — E1159 Type 2 diabetes mellitus with other circulatory complications: Secondary | ICD-10-CM | POA: Diagnosis not present

## 2021-01-04 DIAGNOSIS — I129 Hypertensive chronic kidney disease with stage 1 through stage 4 chronic kidney disease, or unspecified chronic kidney disease: Secondary | ICD-10-CM | POA: Diagnosis not present

## 2021-01-04 DIAGNOSIS — D860 Sarcoidosis of lung: Secondary | ICD-10-CM | POA: Diagnosis not present

## 2021-01-04 DIAGNOSIS — I6523 Occlusion and stenosis of bilateral carotid arteries: Secondary | ICD-10-CM | POA: Diagnosis not present

## 2021-01-04 DIAGNOSIS — N1831 Chronic kidney disease, stage 3a: Secondary | ICD-10-CM | POA: Diagnosis not present

## 2021-01-04 DIAGNOSIS — Z794 Long term (current) use of insulin: Secondary | ICD-10-CM | POA: Diagnosis not present

## 2021-01-04 DIAGNOSIS — M8580 Other specified disorders of bone density and structure, unspecified site: Secondary | ICD-10-CM | POA: Diagnosis not present

## 2021-01-04 DIAGNOSIS — D649 Anemia, unspecified: Secondary | ICD-10-CM | POA: Diagnosis not present

## 2021-01-04 DIAGNOSIS — D0512 Intraductal carcinoma in situ of left breast: Secondary | ICD-10-CM | POA: Diagnosis not present

## 2021-01-04 DIAGNOSIS — I2510 Atherosclerotic heart disease of native coronary artery without angina pectoris: Secondary | ICD-10-CM | POA: Diagnosis not present

## 2021-01-04 DIAGNOSIS — M48061 Spinal stenosis, lumbar region without neurogenic claudication: Secondary | ICD-10-CM | POA: Diagnosis not present

## 2021-01-12 ENCOUNTER — Telehealth: Payer: Self-pay | Admitting: Physician Assistant

## 2021-01-12 DIAGNOSIS — E119 Type 2 diabetes mellitus without complications: Secondary | ICD-10-CM | POA: Diagnosis not present

## 2021-01-12 DIAGNOSIS — K219 Gastro-esophageal reflux disease without esophagitis: Secondary | ICD-10-CM | POA: Diagnosis not present

## 2021-01-12 DIAGNOSIS — H9042 Sensorineural hearing loss, unilateral, left ear, with unrestricted hearing on the contralateral side: Secondary | ICD-10-CM | POA: Diagnosis not present

## 2021-01-12 DIAGNOSIS — I5032 Chronic diastolic (congestive) heart failure: Secondary | ICD-10-CM | POA: Diagnosis not present

## 2021-01-12 DIAGNOSIS — M48061 Spinal stenosis, lumbar region without neurogenic claudication: Secondary | ICD-10-CM | POA: Diagnosis not present

## 2021-01-12 DIAGNOSIS — Z01818 Encounter for other preprocedural examination: Secondary | ICD-10-CM | POA: Diagnosis not present

## 2021-01-12 DIAGNOSIS — I10 Essential (primary) hypertension: Secondary | ICD-10-CM | POA: Diagnosis not present

## 2021-01-12 DIAGNOSIS — I2510 Atherosclerotic heart disease of native coronary artery without angina pectoris: Secondary | ICD-10-CM | POA: Diagnosis not present

## 2021-01-12 NOTE — Telephone Encounter (Signed)
   [facility] Pre-operative Risk Assessment    Patient Name: [name]  DOB: 1945/09/11 MRN: [id number]  HEARTCARE STAFF:  - IMPORTANT!!!!!! Under Visit Info/Reason for Call, type in Other and utilize the format Clearance MM/DD/YY or Clearance TBD. Do not use dashes or single digits. - Please review there is not already an duplicate clearance open for this procedure. - If request is for dental extraction, please clarify the # of teeth to be extracted. - If the patient is currently at the dentist's office, call Pre-Op Callback Staff (MA/nurse) to input urgent request.  - If the patient is not currently in the dentist office, please route to the Pre-Op pool.  Request for surgical clearance:  What type of surgery is being performed? Back surgery    When is this surgery scheduled? 01/17/21   What type of clearance is required (medical clearance vs. Pharmacy clearance to hold med vs. Both)? Medical    Are there any medications that need to be held prior to surgery and how long? Asprin    Practice name and name of physician performing surgery? [facility] / Dr. Cathlean Sauer    What is the office phone number? [phone]   7.   What is the office fax number? [phone]  8.   Anesthesia type (None, local, MAC, general) ? General    Kamira J Martinique 01/12/2021, 4:27 PM  _________________________________________________________________   (provider comments below)

## 2021-01-12 NOTE — Telephone Encounter (Addendum)
   Name: [name]  DOB: August 21, 1945  MRN: [id number]   Primary Cardiologist: Sherren Mocha, MD  Chart reviewed as part of pre-operative protocol coverage. Patient was contacted 01/12/2021 in reference to pre-operative risk assessment for pending surgery as outlined below.  [name] was last seen on 09/2020 by Richardson Dopp PA-C and was felt to be clinically stable at that time. Based on risk factors and medical history she is at increased risk (>40% of CV complications by RCRI calculator) from surgery. I reached out to patient for update on how she is doing. The patient affirms she has been doing well without any new or accelerating cardiac symptoms. Denies edema, orthopnea, PND, or syncope. She is mainly limited in mobility by chronic orthopedic issues but is able to complete 5.07 METS without angina or dyspnea. (Does walking, light housework, folding laundry, sweeping for example.) She denies any changes from prior clinical status. Therefore, based on ACC/AHA guidelines, the patient would be at acceptable risk for the planned procedure without further cardiovascular testing.   I spoke with Dr. Burt Knack regarding her aspirin. We typically like to continue ASA 81mg  daily unless the surgeon feels it is necessary to stop for her procedure. Dr. Burt Knack feels it is OK to hold aspirin if clinically indicated for surgery. We typically advise that blood thinners be resumed when felt safe by performing physician.  When I spoke with patient, she indicated she had been taking aspirin without interruption so far (including today) and was not sure if she was going to be required to hold aspirin for the surgery. Therefore I called the requesting provider number as below and reached the anesthesia team. I spoke with Butch Penny and Denny Peon who were very helpful. Denny Peon indicated she had reached out to the surgeon to clarify if the patient was required to stop aspirin, and had this patient on her list to follow up  with and call back tomorrow. The patient was made aware not to take her aspirin yet in the morning, pending a call back from their office about whether she needs to begin holding.   The patient was advised that if she develops new symptoms prior to surgery to contact our office to arrange for a follow-up visit, and she verbalized understanding.  I will route this recommendation to the requesting party via Epic fax function and remove from pre-op pool. Please call with questions.  Charlie Pitter, PA-C 01/12/2021, 5:25 PM

## 2021-01-12 NOTE — Telephone Encounter (Signed)
Will forward to pre op

## 2021-01-12 NOTE — Telephone Encounter (Signed)
This encounter was created in error - please disregard.

## 2021-01-15 DIAGNOSIS — Z20822 Contact with and (suspected) exposure to covid-19: Secondary | ICD-10-CM | POA: Diagnosis not present

## 2021-01-16 ENCOUNTER — Encounter (HOSPITAL_COMMUNITY): Payer: Self-pay

## 2021-01-24 ENCOUNTER — Telehealth: Payer: Self-pay | Admitting: *Deleted

## 2021-01-24 NOTE — Telephone Encounter (Signed)
Patient called stating she has a sore in the vaginal area the is very sore and painful. She noticed it 2 days ago, she is scheduled for back surgery on Friday and would like for you to look at this area before her back surgery. She reports it could be a boil, reports a history of these in the past. Per appointment desk no appointments this week. Do you want to work patient in? Please advise

## 2021-01-25 ENCOUNTER — Encounter: Payer: Self-pay | Admitting: Family Medicine

## 2021-01-25 ENCOUNTER — Ambulatory Visit: Payer: Self-pay | Admitting: Obstetrics and Gynecology

## 2021-01-25 ENCOUNTER — Telehealth (INDEPENDENT_AMBULATORY_CARE_PROVIDER_SITE_OTHER): Payer: Medicare Other | Admitting: Family Medicine

## 2021-01-25 DIAGNOSIS — I779 Disorder of arteries and arterioles, unspecified: Secondary | ICD-10-CM

## 2021-01-25 DIAGNOSIS — Z20822 Contact with and (suspected) exposure to covid-19: Secondary | ICD-10-CM | POA: Diagnosis not present

## 2021-01-25 NOTE — Telephone Encounter (Signed)
Called and spoke with patient about coming in this morning. Explained we will be working her in.  She has dentist appt at 10:00am and will come over as soon as that if completed.  I scheduled her for 11:30am.

## 2021-01-25 NOTE — Progress Notes (Deleted)
GYNECOLOGY  VISIT   HPI: 75 y.o.   Married  [place] American  female   [phone] with No LMP recorded. Patient has had a hysterectomy.   here for     GYNECOLOGIC HISTORY: No LMP recorded. Patient has had a hysterectomy. Contraception:  Hyst Menopausal hormone therapy:  none Last mammogram: 08-31-20 3D Diag.Bil/Neg/Birads2 Last pap smear:  2011 normal per hx        OB History     Gravida  2   Para  1   Term  1   Preterm      AB  1   Living  1      SAB  1   IAB      Ectopic      Multiple      Live Births                 Patient Active Problem List   Diagnosis Date Noted   Asymptomatic varicose veins of both lower extremities 11/04/2020   Ductal carcinoma in situ (DCIS) of left breast 01/06/2020   Right-sided thoracic back pain 07/01/2019   Hyperlipidemia 07/17/2017   History of lumbar spinal fusion    History of fusion of lumbar spine    Anemia due to blood loss 11/02/2016    Class: Acute   Lethargy 10/31/2016   AKI (acute kidney injury) ([place]) 10/31/2016   Chronic diastolic CHF (congestive heart failure) ([place]) 10/31/2016   Cough    Leukocytosis    Spondylolisthesis, lumbar region 10/30/2016    Class: Chronic   Spinal stenosis of lumbar region 10/30/2016    Class: Chronic   Retained orthopedic hardware 10/30/2016    Class: Chronic   Spinal stenosis of lumbar region with neurogenic claudication 10/30/2016   Genetic testing 09/14/2015   Atypical ductal hyperplasia of left breast 08/24/2015   Family history of breast cancer    Family history of prostate cancer    Family history of stomach cancer    Abnormal nuclear stress test 12/29/2014   Cervical spondylosis with myelopathy and radiculopathy 06/11/2014   Carpal tunnel syndrome 05/14/2014   Aftercare following surgery of the circulatory system, NEC 03/11/2014   Pain of right lower extremity 03/11/2014   Atypical lobular hyperplasia of left breast 02/12/2014   Fibromyalgia 09/21/2013   Carotid  artery disease ([place]) 02/29/2012   Back abscess 01/22/2012   Chest pain with moderate risk of acute coronary syndrome 12/09/2009   Shortness of breath 06/10/2009   Orthostatic hypotension 01/31/2009   CAD (coronary artery disease) 12/23/2008   Normocytic anemia 07/21/2008   Headache(784.0) 07/21/2008   History of TIA (transient ischemic attack) 07/21/2008   Edema 02/27/2008   Diabetes mellitus with complication ([place]) 17/00/1749   Depression 07/16/2007   Dyslipidemia 02/21/2007   Essential hypertension 02/21/2007   Asthma 02/21/2007   GERD 02/21/2007   LOW BACK PAIN 02/21/2007    Past Medical History:  Diagnosis Date   Abnormal nuclear stress test 12/29/2014   Allergy    Anemia, unspecified    Arthritis    Blood transfusion without reported diagnosis    2018   CAD (coronary artery disease)    sees. Dr. Burt Knack. s/p cath with PCI of RCA (xience des) June 2010. Pt also with LAD and D2 dzs-NL FFR in both areas med Rx   Carotid artery occlusion    Cataract    sees Dr. Herbert Deaner    Depressive disorder, not elsewhere classified    Duodenal nodule  Edema    Fibromyalgia    Gastric polyps    COLON   GERD (gastroesophageal reflux disease)    Glaucoma    narrow angle, sees Dr. Shelly Flatten)    Hearing loss    left ear   Heart murmur    questionable   Hemorrhoids    HSV (herpes simplex virus) anogenital infection 05/2014   HTN (hypertension)    Hyperlipidemia    Low back pain    she sees Dr. Louanne Skye, also Dr. Ernestina Patches for pain medications and Benjiman Core PA for injections    Obesity    Osteopenia 01/2018   T score -1.6 FRAX 7.1% / 1.4%   Peripheral vascular disease ([place])    Stroke ([place])    TIA  "many yrs ago"     TIA (transient ischemic attack)    also Hx of it.    Type II or unspecified type diabetes mellitus without mention of complication, not stated as uncontrolled    sees Dr. Carrolyn Meiers     Past Surgical History:  Procedure Laterality Date   ABDOMINAL  HYSTERECTOMY  age 75   TAH and USO  Leiomyomata   ANTERIOR CERVICAL CORPECTOMY N/A 06/11/2014   Procedure: ANTERIOR CERVICAL CORPECTOMY CERVICAL SIX; WITH CERVICAL FIVE TO CERVICAL SEVEN ARTHRODESIS;  Surgeon: Ashok Pall, MD;  Location: [facility];  Service: Neurosurgery;  Laterality: N/A;   BACK SURGERY  2008   lumb fusion   BREAST BIOPSY Left 01/26/2014   Procedure: EXCISION LEFT BREAST MASS;  Surgeon: Adin Hector, MD;  Location: [facility];  Service: General;  Laterality: Left;   BREAST LUMPECTOMY WITH RADIOACTIVE SEED LOCALIZATION Left 07/24/2017   Procedure: LEFT BREAST LUMPECTOMY WITH RADIOACTIVE SEED LOCALIZATION ERAS;  Surgeon: Erroll Luna, MD;  Location: [facility];  Service: General;  Laterality: Left;   BREAST LUMPECTOMY WITH RADIOACTIVE SEED LOCALIZATION Left 01/13/2020   Procedure: LEFT BREAST LUMPECTOMY WITH RADIOACTIVE SEED LOCALIZATION;  Surgeon: Erroll Luna, MD;  Location: [facility];  Service: General;  Laterality: Left;   CARDIAC CATHETERIZATION     CARDIAC CATHETERIZATION N/A 12/29/2014   Procedure: Left Heart Cath and Coronary Angiography;  Surgeon: Sherren Mocha, MD;  Location: [facility];  Service: Cardiovascular;  Laterality: N/A;   CAROTID ENDARTERECTOMY  march 2012   per Dr. Morton Amy   CARPAL TUNNEL RELEASE Left 05/14/2014   Procedure: Left Carpal tunnel release;  Surgeon: Ashok Pall, MD;  Location: [facility];  Service: Neurosurgery;  Laterality: Left;  Left Carpal tunnel release   CARPAL TUNNEL RELEASE Bilateral    CHOLECYSTECTOMY     COLONOSCOPY  06/26/2017   per Dr. Henrene Pastor, adenomatous polyp, repeat in 5 yrs     CORONARY STENT PLACEMENT     Dr. Burt Knack   DILATION AND CURETTAGE OF UTERUS     ESOPHAGOGASTRODUODENOSCOPY  02-08-06   per Dr. Henrene Pastor, gastritis    EYE SURGERY Left June 2016   Cataract   GLAUCOMA SURGERY     with laser, per Dr. Henrene Pastor    HARDWARE REMOVAL  10/30/2016    Procedure: HARDWARE REMOVAL;  Surgeon: Jessy Oto, MD;  Location: [facility];  Service: Orthopedics;;   LUMBAR FUSION  2008   POLYPECTOMY     POSTERIOR LUMBAR FUSION  10/30/2016   Removal of hardware rods and pedicle screws lumbar four, Extension of fusion L4-5 to L5-S1 with reinsertion of screws at L 4, left L5-S1 transforaminal lumbar  interbody fusion, Exploration  left L5 nerve root, bilateral decompressive laminectomy L3-4/notes 10/30/2016   ULNAR NERVE TRANSPOSITION Left 05/14/2014   Procedure: Left Ulnar nerve decompression;  Surgeon: Ashok Pall, MD;  Location: [facility];  Service: Neurosurgery;  Laterality: Left;  Left Ulnar nerve decompression    Current Outpatient Medications  Medication Sig Dispense Refill   ACCU-CHEK GUIDE test strip 1 each by Other route in the morning and at bedtime.     aspirin EC 81 MG tablet Take 1 tablet (81 mg total) by mouth daily. Swallow whole. 90 tablet 3   cetirizine (ZYRTEC) 10 MG tablet Take 1 tablet (10 mg total) by mouth daily. 30 tablet 11   cyclobenzaprine (FLEXERIL) 10 MG tablet Take 1 tablet (10 mg total) by mouth 3 (three) times daily as needed for muscle spasms. 90 tablet 2   diclofenac sodium (VOLTAREN) 1 % GEL Apply 2 g topically 4 (four) times daily as needed (for pain).     ergocalciferol (VITAMIN D2) 50000 UNITS capsule Take 50,000 Units by mouth every Saturday. On Saturday.     FARXIGA 10 MG TABS tablet Take 10 mg by mouth every morning.     furosemide (LASIX) 40 MG tablet Take 40 mg by mouth daily.     HUMALOG 100 UNIT/ML injection      hydrALAZINE (APRESOLINE) 100 MG tablet TAKE 1 TABLET BY MOUTH THREE TIMES DAILY 270 tablet 2   HYDROcodone-homatropine (HYCODAN) 5-1.5 MG/5ML syrup Take 5 mLs by mouth every 4 (four) hours as needed for cough. 240 mL 0   ibuprofen (ADVIL) 600 MG tablet Take 1 tablet (600 mg total) by mouth every 6 (six) hours as needed. 30 tablet 0   Insulin Disposable Pump (V-GO 40) KIT Inject 1 application into the  skin See admin instructions.     Insulin Human (INSULIN PUMP) SOLN Inject into the skin. Uses Novolog insulin for pump     irbesartan (AVAPRO) 300 MG tablet TAKE 1 TABLET BY MOUTH ONCE DAILY(APPT REQUIRED FOR FUTURE REFILLS) 90 tablet 0   isosorbide mononitrate (IMDUR) 60 MG 24 hr tablet TAKE 1 TABLET BY MOUTH ONCE DAILY -  PLEASE  KEEP  UPCOMING  APPOINTMENTIN  JULY  BEFORE  ANYMORE  REFILLS 90 tablet 3   latanoprost (XALATAN) 0.005 % ophthalmic solution Place 1 drop into both eyes at bedtime.      metoprolol succinate (TOPROL-XL) 100 MG 24 hr tablet Take 1 tablet (100 mg total) by mouth daily. Pt needs to keep upcoming appt for further refills 90 tablet 1   montelukast (SINGULAIR) 10 MG tablet Take 10 mg by mouth daily.     nitroGLYCERIN (NITROSTAT) 0.4 MG SL tablet Place 1 tablet (0.4 mg total) under the tongue every 5 (five) minutes as needed for chest pain. 25 tablet 1   nystatin-triamcinolone (MYCOLOG II) cream Apply 1 application topically 2 (two) times daily. 30 g 2   OZEMPIC, 1 MG/DOSE, 4 MG/3ML SOPN Inject 1 mg into the skin once a week.     pantoprazole (PROTONIX) 40 MG tablet Take 1 tablet by mouth once daily 90 tablet 1   rosuvastatin (CRESTOR) 40 MG tablet Take 1 tablet by mouth once daily 90 tablet 3   spironolactone (ALDACTONE) 50 MG tablet Take 1 tablet by mouth once daily 90 tablet 2   No current facility-administered medications for this visit.     ALLERGIES: Patient has no known allergies.  Family History  Problem Relation Age of Onset   Hypertension Mother  Hypertension Sister    Hyperlipidemia Sister    Hypertension Brother    Cancer Brother        PROSTATE/LUNG   Diabetes Brother    Heart disease Brother        Before age 95 - Bypass   Varicose Veins Brother    Cancer Father        Prostate and pancreatic    Stomach cancer Maternal Grandmother 65   Heart disease Maternal Grandfather    Breast cancer Maternal Aunt 55   Cancer Maternal Aunt        Stomach    Breast cancer Cousin 40       maternal first cousin   Prostate cancer Cousin 97   Breast cancer Cousin 60   Esophageal cancer Cousin    Breast cancer Cousin 60       maternal first cousin's daughter   Coronary artery disease Neg Hx        premature CAD   Colon cancer Neg Hx    Colon polyps Neg Hx    Rectal cancer Neg Hx     Social History   Socioeconomic History   Marital status: Married    Spouse name: Not on file   Number of children: 1   Years of education: 16   Highest education level: Bachelor's degree (e.g., BA, AB, BS)  Occupational History   Not on file  Tobacco Use   Smoking status: Former    Types: Cigarettes    Quit date: 01/22/1967    Years since quitting: 54.0   Smokeless tobacco: Never  Vaping Use   Vaping Use: Never used  Substance and Sexual Activity   Alcohol use: No    Alcohol/week: 0.0 standard drinks   Drug use: No   Sexual activity: Not Currently    Birth control/protection: Surgical, Post-menopausal    Comment: HYST-1st intercourse 37 yo-5 partners  Other Topics Concern   Not on file  Social History Narrative   Not on file   Social Determinants of Health   Financial Resource Strain: Not on file  Food Insecurity: Not on file  Transportation Needs: Not on file  Physical Activity: Not on file  Stress: Not on file  Social Connections: Not on file  Intimate Partner Violence: Not on file    Review of Systems  PHYSICAL EXAMINATION:    There were no vitals taken for this visit.    General appearance: alert, cooperative and appears stated age Head: Normocephalic, without obvious abnormality, atraumatic Neck: no adenopathy, supple, symmetrical, trachea midline and thyroid normal to inspection and palpation Lungs: clear to auscultation bilaterally Breasts: normal appearance, no masses or tenderness, No nipple retraction or dimpling, No nipple discharge or bleeding, No axillary or supraclavicular adenopathy Heart: regular rate and  rhythm Abdomen: soft, non-tender, no masses,  no organomegaly Extremities: extremities normal, atraumatic, no cyanosis or edema Skin: Skin color, texture, turgor normal. No rashes or lesions Lymph nodes: Cervical, supraclavicular, and axillary nodes normal. No abnormal inguinal nodes palpated Neurologic: Grossly normal  Pelvic: External genitalia:  no lesions              Urethra:  normal appearing urethra with no masses, tenderness or lesions              Bartholins and Skenes: normal                 Vagina: normal appearing vagina with normal color and discharge, no lesions  Cervix: no lesions                Bimanual Exam:  Uterus:  normal size, contour, position, consistency, mobility, non-tender              Adnexa: no mass, fullness, tenderness              Rectal exam: {yes no:314532}.  Confirms.              Anus:  normal sphincter tone, no lesions  Chaperone was present for exam:  ***  ASSESSMENT     PLAN     An After Visit Summary was printed and given to the patient.  ______ minutes face to face time of which over 50% was spent in counseling.

## 2021-01-25 NOTE — Progress Notes (Signed)
   Subjective:    Patient ID: [name], female    DOB: 20-Sep-1945, 75 y.o.   MRN: [id number]  HPI Virtual Visit via Telephone Note  I connected with the patient on 01/25/21 at  3:15 PM EDT by telephone and verified that I am speaking with the correct person using two identifiers.   I discussed the limitations, risks, security and privacy concerns of performing an evaluation and management service by telephone and the availability of in person appointments. I also discussed with the patient that there may be a patient responsible charge related to this service. The patient expressed understanding and agreed to proceed.  Location patient: home Location provider: work or home office Participants present for the call: patient, provider Patient did not have a visit in the prior 7 days to address this/these issue(s).   History of Present Illness: Here to follow up carotid artery disease. In 2012 she underwent a right endarterectomy per Dr. Ruta Hinds, and she has been getting yearly ultrasounds to follow this. Dr. Oneida Alar told her to have her PCP order these yearly, and he would only need to see her again if needed. Her last [place] was on 03-30-20 and it showed stable 1-39% stenoses on both sides. She feels well.    Observations/Objective: Patient sounds cheerful and well on the phone. I do not appreciate any SOB. Speech and thought processing are grossly intact. Patient reported vitals:  Assessment and Plan: Carotid artery disease. We will set up carotid dopplers for early September.  Alysia Penna, MD   Follow Up Instructions:     [phone] 11-20 [phone] 21-30 I did not refer this patient for an OV in the next 24 hours for this/these issue(s).  I discussed the assessment and treatment plan with the patient. The patient was provided an opportunity to ask questions and all were answered. The patient agreed with the plan and demonstrated an understanding of the  instructions.   The patient was advised to call back or seek an in-person evaluation if the symptoms worsen or if the condition fails to improve as anticipated.  I provided 15 minutes of non-face-to-face time during this encounter.   Alysia Penna, MD     Review of Systems     Objective:   Physical Exam        Assessment & Plan:

## 2021-01-25 NOTE — Telephone Encounter (Signed)
Please work this patient in my schedule for this morning.

## 2021-01-26 ENCOUNTER — Ambulatory Visit (INDEPENDENT_AMBULATORY_CARE_PROVIDER_SITE_OTHER): Payer: Medicare Other | Admitting: Obstetrics and Gynecology

## 2021-01-26 ENCOUNTER — Encounter: Payer: Self-pay | Admitting: Obstetrics and Gynecology

## 2021-01-26 ENCOUNTER — Other Ambulatory Visit: Payer: Self-pay

## 2021-01-26 VITALS — BP 140/76

## 2021-01-26 DIAGNOSIS — N764 Abscess of vulva: Secondary | ICD-10-CM | POA: Diagnosis not present

## 2021-01-26 DIAGNOSIS — I2510 Atherosclerotic heart disease of native coronary artery without angina pectoris: Secondary | ICD-10-CM

## 2021-01-26 NOTE — Progress Notes (Signed)
GYNECOLOGY  VISIT   HPI: 75 y.o.   Married  [place] American  female   [phone] with No LMP recorded. Patient has had a hysterectomy.   here for possible sebaceous cyst in right groin area. No drainage.  No fevers.  Doing sitz baths. Not painful.  Status that the Diflucan she had at her last visit helped resolve her vulvitis.   Having back surgery tomorrow at [facility].  Asking if she should cancel her surgery due to the boil.  GYNECOLOGIC HISTORY: No LMP recorded. Patient has had a hysterectomy. Contraception:  Hyst Menopausal hormone therapy: None Last mammogram:  08-31-20 3D Diag.Bil/Neg/Birads2 Last pap smear:  2011 normal per hx        OB History     Gravida  2   Para  1   Term  1   Preterm      AB  1   Living  1      SAB  1   IAB      Ectopic      Multiple      Live Births                 Patient Active Problem List   Diagnosis Date Noted   Asymptomatic varicose veins of both lower extremities 11/04/2020   Ductal carcinoma in situ (DCIS) of left breast 01/06/2020   Right-sided thoracic back pain 07/01/2019   Hyperlipidemia 07/17/2017   History of lumbar spinal fusion    History of fusion of lumbar spine    Anemia due to blood loss 11/02/2016    Class: Acute   Lethargy 10/31/2016   AKI (acute kidney injury) ([place]) 10/31/2016   Chronic diastolic CHF (congestive heart failure) ([place]) 10/31/2016   Cough    Leukocytosis    Spondylolisthesis, lumbar region 10/30/2016    Class: Chronic   Spinal stenosis of lumbar region 10/30/2016    Class: Chronic   Retained orthopedic hardware 10/30/2016    Class: Chronic   Spinal stenosis of lumbar region with neurogenic claudication 10/30/2016   Genetic testing 09/14/2015   Atypical ductal hyperplasia of left breast 08/24/2015   Family history of breast cancer    Family history of prostate cancer    Family history of stomach cancer    Abnormal nuclear stress test 12/29/2014   Cervical spondylosis with  myelopathy and radiculopathy 06/11/2014   Carpal tunnel syndrome 05/14/2014   Aftercare following surgery of the circulatory system, NEC 03/11/2014   Pain of right lower extremity 03/11/2014   Atypical lobular hyperplasia of left breast 02/12/2014   Fibromyalgia 09/21/2013   Carotid artery disease ([place]) 02/29/2012   Back abscess 01/22/2012   Chest pain with moderate risk of acute coronary syndrome 12/09/2009   Shortness of breath 06/10/2009   Orthostatic hypotension 01/31/2009   CAD (coronary artery disease) 12/23/2008   Normocytic anemia 07/21/2008   Headache(784.0) 07/21/2008   History of TIA (transient ischemic attack) 07/21/2008   Edema 02/27/2008   Diabetes mellitus with complication ([place]) 59/16/3846   Depression 07/16/2007   Dyslipidemia 02/21/2007   Essential hypertension 02/21/2007   Asthma 02/21/2007   GERD 02/21/2007   LOW BACK PAIN 02/21/2007    Past Medical History:  Diagnosis Date   Abnormal nuclear stress test 12/29/2014   Allergy    Anemia, unspecified    Arthritis    Blood transfusion without reported diagnosis    2018   CAD (coronary artery disease)    sees. Dr. Burt Knack. s/p cath with PCI  of RCA (xience des) June 2010. Pt also with LAD and D2 dzs-NL FFR in both areas med Rx   Carotid artery occlusion    Cataract    sees Dr. Herbert Deaner    Depressive disorder, not elsewhere classified    Duodenal nodule    Edema    Fibromyalgia    Gastric polyps    COLON   GERD (gastroesophageal reflux disease)    Glaucoma    narrow angle, sees Dr. Shelly Flatten)    Hearing loss    left ear   Heart murmur    questionable   Hemorrhoids    HSV (herpes simplex virus) anogenital infection 05/2014   HTN (hypertension)    Hyperlipidemia    Low back pain    she sees Dr. Louanne Skye, also Dr. Ernestina Patches for pain medications and Benjiman Core PA for injections    Obesity    Osteopenia 01/2018   T score -1.6 FRAX 7.1% / 1.4%   Peripheral vascular disease ([place])    Stroke  ([place])    TIA  "many yrs ago"     TIA (transient ischemic attack)    also Hx of it.    Type II or unspecified type diabetes mellitus without mention of complication, not stated as uncontrolled    sees Dr. Carrolyn Meiers     Past Surgical History:  Procedure Laterality Date   ABDOMINAL HYSTERECTOMY  age 28   TAH and USO  Leiomyomata   ANTERIOR CERVICAL CORPECTOMY N/A 06/11/2014   Procedure: ANTERIOR CERVICAL CORPECTOMY CERVICAL SIX; WITH CERVICAL FIVE TO CERVICAL SEVEN ARTHRODESIS;  Surgeon: Ashok Pall, MD;  Location: [place] [facility];  Service: Neurosurgery;  Laterality: N/A;   BACK SURGERY  2008   lumb fusion   BREAST BIOPSY Left 01/26/2014   Procedure: EXCISION LEFT BREAST MASS;  Surgeon: Adin Hector, MD;  Location: [place];  Service: General;  Laterality: Left;   BREAST LUMPECTOMY WITH RADIOACTIVE SEED LOCALIZATION Left 07/24/2017   Procedure: LEFT BREAST LUMPECTOMY WITH RADIOACTIVE SEED LOCALIZATION ERAS;  Surgeon: Erroll Luna, MD;  Location: [place];  Service: General;  Laterality: Left;   BREAST LUMPECTOMY WITH RADIOACTIVE SEED LOCALIZATION Left 01/13/2020   Procedure: LEFT BREAST LUMPECTOMY WITH RADIOACTIVE SEED LOCALIZATION;  Surgeon: Erroll Luna, MD;  Location: [place];  Service: General;  Laterality: Left;   CARDIAC CATHETERIZATION     CARDIAC CATHETERIZATION N/A 12/29/2014   Procedure: Left Heart Cath and Coronary Angiography;  Surgeon: Sherren Mocha, MD;  Location: [place] [facility];  Service: Cardiovascular;  Laterality: N/A;   CAROTID ENDARTERECTOMY  march 2012   per Dr. Morton Amy   CARPAL TUNNEL RELEASE Left 05/14/2014   Procedure: Left Carpal tunnel release;  Surgeon: Ashok Pall, MD;  Location: [place] [facility];  Service: Neurosurgery;  Laterality: Left;  Left Carpal tunnel release   CARPAL TUNNEL RELEASE Bilateral    CHOLECYSTECTOMY     COLONOSCOPY  06/26/2017   per Dr. Henrene Pastor, adenomatous polyp, repeat in 5  yrs     CORONARY STENT PLACEMENT     Dr. Burt Knack   DILATION AND CURETTAGE OF UTERUS     ESOPHAGOGASTRODUODENOSCOPY  02-08-06   per Dr. Henrene Pastor, gastritis    EYE SURGERY Left June 2016   Cataract   GLAUCOMA SURGERY     with laser, per Dr. Henrene Pastor    HARDWARE REMOVAL  10/30/2016   Procedure: HARDWARE REMOVAL;  Surgeon: Jessy Oto, MD;  Location: [facility]  OR;  Service: Orthopedics;;   LUMBAR FUSION  2008   POLYPECTOMY     POSTERIOR LUMBAR FUSION  10/30/2016   Removal of hardware rods and pedicle screws lumbar four, Extension of fusion L4-5 to L5-S1 with reinsertion of screws at L 4, left L5-S1 transforaminal lumbar interbody fusion, Exploration  left L5 nerve root, bilateral decompressive laminectomy L3-4/notes 10/30/2016   ULNAR NERVE TRANSPOSITION Left 05/14/2014   Procedure: Left Ulnar nerve decompression;  Surgeon: Ashok Pall, MD;  Location: [place] [facility];  Service: Neurosurgery;  Laterality: Left;  Left Ulnar nerve decompression    Current Outpatient Medications  Medication Sig Dispense Refill   ACCU-CHEK GUIDE test strip 1 each by Other route in the morning and at bedtime.     aspirin EC 81 MG tablet Take 1 tablet (81 mg total) by mouth daily. Swallow whole. 90 tablet 3   cetirizine (ZYRTEC) 10 MG tablet Take 1 tablet (10 mg total) by mouth daily. 30 tablet 11   cyclobenzaprine (FLEXERIL) 10 MG tablet Take 1 tablet (10 mg total) by mouth 3 (three) times daily as needed for muscle spasms. 90 tablet 2   diclofenac sodium (VOLTAREN) 1 % GEL Apply 2 g topically 4 (four) times daily as needed (for pain).     ergocalciferol (VITAMIN D2) 50000 UNITS capsule Take 50,000 Units by mouth every Saturday. On Saturday.     FARXIGA 10 MG TABS tablet Take 10 mg by mouth every morning.     furosemide (LASIX) 40 MG tablet Take 40 mg by mouth daily.     HUMALOG 100 UNIT/ML injection      hydrALAZINE (APRESOLINE) 100 MG tablet TAKE 1 TABLET BY MOUTH THREE TIMES DAILY 270 tablet 2   HYDROcodone-homatropine  (HYCODAN) 5-1.5 MG/5ML syrup Take 5 mLs by mouth every 4 (four) hours as needed for cough. 240 mL 0   ibuprofen (ADVIL) 600 MG tablet Take 1 tablet (600 mg total) by mouth every 6 (six) hours as needed. 30 tablet 0   Insulin Disposable Pump (V-GO 40) KIT Inject 1 application into the skin See admin instructions.     Insulin Human (INSULIN PUMP) SOLN Inject into the skin. Uses Novolog insulin for pump     irbesartan (AVAPRO) 300 MG tablet TAKE 1 TABLET BY MOUTH ONCE DAILY(APPT REQUIRED FOR FUTURE REFILLS) 90 tablet 0   isosorbide mononitrate (IMDUR) 60 MG 24 hr tablet TAKE 1 TABLET BY MOUTH ONCE DAILY -  PLEASE  KEEP  UPCOMING  APPOINTMENTIN  JULY  BEFORE  ANYMORE  REFILLS 90 tablet 3   latanoprost (XALATAN) 0.005 % ophthalmic solution Place 1 drop into both eyes at bedtime.      metoprolol succinate (TOPROL-XL) 100 MG 24 hr tablet Take 1 tablet (100 mg total) by mouth daily. Pt needs to keep upcoming appt for further refills 90 tablet 1   montelukast (SINGULAIR) 10 MG tablet Take 10 mg by mouth daily.     nitroGLYCERIN (NITROSTAT) 0.4 MG SL tablet Place 1 tablet (0.4 mg total) under the tongue every 5 (five) minutes as needed for chest pain. 25 tablet 1   nystatin-triamcinolone (MYCOLOG II) cream Apply 1 application topically 2 (two) times daily. 30 g 2   OZEMPIC, 1 MG/DOSE, 4 MG/3ML SOPN Inject 1 mg into the skin once a week.     pantoprazole (PROTONIX) 40 MG tablet Take 1 tablet by mouth once daily 90 tablet 1   rosuvastatin (CRESTOR) 40 MG tablet Take 1 tablet by mouth once daily 90 tablet  3   spironolactone (ALDACTONE) 50 MG tablet Take 1 tablet by mouth once daily 90 tablet 2   traMADol (ULTRAM) 50 MG tablet 50 mg continuously as needed     No current facility-administered medications for this visit.     ALLERGIES: Patient has no known allergies.  Family History  Problem Relation Age of Onset   Hypertension Mother    Hypertension Sister    Hyperlipidemia Sister    Hypertension Brother     Cancer Brother        PROSTATE/LUNG   Diabetes Brother    Heart disease Brother        Before age 42 - Bypass   Varicose Veins Brother    Cancer Father        Prostate and pancreatic    Stomach cancer Maternal Grandmother 65   Heart disease Maternal Grandfather    Breast cancer Maternal Aunt 55   Cancer Maternal Aunt        Stomach   Breast cancer Cousin 31       maternal first cousin   Prostate cancer Cousin 74   Breast cancer Cousin 60   Esophageal cancer Cousin    Breast cancer Cousin 77       maternal first cousin's daughter   Coronary artery disease Neg Hx        premature CAD   Colon cancer Neg Hx    Colon polyps Neg Hx    Rectal cancer Neg Hx     Social History   Socioeconomic History   Marital status: Married    Spouse name: Not on file   Number of children: 1   Years of education: 16   Highest education level: Bachelor's degree (e.g., BA, AB, BS)  Occupational History   Not on file  Tobacco Use   Smoking status: Former    Types: Cigarettes    Quit date: 01/22/1967    Years since quitting: 54.0   Smokeless tobacco: Never  Vaping Use   Vaping Use: Never used  Substance and Sexual Activity   Alcohol use: No    Alcohol/week: 0.0 standard drinks   Drug use: No   Sexual activity: Not Currently    Birth control/protection: Surgical, Post-menopausal    Comment: HYST-1st intercourse 74 yo-5 partners  Other Topics Concern   Not on file  Social History Narrative   Not on file   Social Determinants of Health   Financial Resource Strain: Not on file  Food Insecurity: Not on file  Transportation Needs: Not on file  Physical Activity: Not on file  Stress: Not on file  Social Connections: Not on file  Intimate Partner Violence: Not on file    Review of Systems  Skin:        Cyst right groin  All other systems reviewed and are negative.  PHYSICAL EXAMINATION:    BP 140/76     General appearance: alert, cooperative and appears stated age    Pelvic: External genitalia:  right vulvar boil 7 - 8 mm at margin of vulva and right medial thigh.  Nontender, no erythema, no drainage.  Right labia minora with 6 mm sebaceous cyst.               Urethra:  normal appearing urethra with no masses, tenderness or lesions                   Chaperone was present for exam:  Estill Bamberg, CMA.  ASSESSMENT  Vulvar boil.  PLAN  Warm compresses.  No oral abx at this time.  No I and D needed.  I encouraged her to proceed forward with surgery.  I anticipate that she will receive prophylactic abx that will more than treat the boil.   Fu prn.    An After Visit Summary was printed and given to the patient.

## 2021-01-27 DIAGNOSIS — Z5329 Procedure and treatment not carried out because of patient's decision for other reasons: Secondary | ICD-10-CM | POA: Diagnosis not present

## 2021-01-27 DIAGNOSIS — M48061 Spinal stenosis, lumbar region without neurogenic claudication: Secondary | ICD-10-CM | POA: Diagnosis not present

## 2021-01-27 DIAGNOSIS — Z794 Long term (current) use of insulin: Secondary | ICD-10-CM | POA: Diagnosis not present

## 2021-01-27 DIAGNOSIS — Z853 Personal history of malignant neoplasm of breast: Secondary | ICD-10-CM | POA: Diagnosis not present

## 2021-01-27 DIAGNOSIS — H9042 Sensorineural hearing loss, unilateral, left ear, with unrestricted hearing on the contralateral side: Secondary | ICD-10-CM | POA: Diagnosis not present

## 2021-01-27 DIAGNOSIS — I110 Hypertensive heart disease with heart failure: Secondary | ICD-10-CM | POA: Diagnosis not present

## 2021-01-27 DIAGNOSIS — Z538 Procedure and treatment not carried out for other reasons: Secondary | ICD-10-CM | POA: Diagnosis not present

## 2021-01-27 DIAGNOSIS — Z955 Presence of coronary angioplasty implant and graft: Secondary | ICD-10-CM | POA: Diagnosis not present

## 2021-01-27 DIAGNOSIS — Z7982 Long term (current) use of aspirin: Secondary | ICD-10-CM | POA: Diagnosis not present

## 2021-01-27 DIAGNOSIS — Z7984 Long term (current) use of oral hypoglycemic drugs: Secondary | ICD-10-CM | POA: Diagnosis not present

## 2021-01-27 DIAGNOSIS — E119 Type 2 diabetes mellitus without complications: Secondary | ICD-10-CM | POA: Diagnosis not present

## 2021-01-27 DIAGNOSIS — I5032 Chronic diastolic (congestive) heart failure: Secondary | ICD-10-CM | POA: Diagnosis not present

## 2021-01-27 DIAGNOSIS — Z79899 Other long term (current) drug therapy: Secondary | ICD-10-CM | POA: Diagnosis not present

## 2021-01-27 DIAGNOSIS — Z9049 Acquired absence of other specified parts of digestive tract: Secondary | ICD-10-CM | POA: Diagnosis not present

## 2021-01-27 DIAGNOSIS — K219 Gastro-esophageal reflux disease without esophagitis: Secondary | ICD-10-CM | POA: Diagnosis not present

## 2021-01-27 DIAGNOSIS — Z923 Personal history of irradiation: Secondary | ICD-10-CM | POA: Diagnosis not present

## 2021-01-27 DIAGNOSIS — I2510 Atherosclerotic heart disease of native coronary artery without angina pectoris: Secondary | ICD-10-CM | POA: Diagnosis not present

## 2021-01-27 DIAGNOSIS — E785 Hyperlipidemia, unspecified: Secondary | ICD-10-CM | POA: Diagnosis not present

## 2021-01-27 DIAGNOSIS — Z8673 Personal history of transient ischemic attack (TIA), and cerebral infarction without residual deficits: Secondary | ICD-10-CM | POA: Diagnosis not present

## 2021-01-30 ENCOUNTER — Telehealth: Payer: Self-pay | Admitting: Cardiovascular Disease

## 2021-01-30 NOTE — Telephone Encounter (Signed)
Left message to call back  

## 2021-01-30 NOTE — Telephone Encounter (Signed)
Left message for patient to call back to schedule appointment - and find out blood pressure readings.

## 2021-01-30 NOTE — Telephone Encounter (Signed)
Spoke to patient. Patient states her blood pressure has been good  except the day she went for pre op - surgery will be at [facility]. Patient did not given RN any recording of pressure   She states the numbers  256/?, 238/?, 208/?Marland Kitchen  Patient states the  anthes.( Pre-op) told her blood pressure should be less than 180/?  Appointment schedule for 01/31/21 at 8:30 am with Vella Raring NP  Patient is aware  that  NP can  clear her for surgery if  no issue with blood pressure.

## 2021-01-30 NOTE — Telephone Encounter (Signed)
Patient called and said that she was supposed to have surgery but it was called off because of her high blood pressure. They mentioned to her that she needs to cardiologist asap.

## 2021-01-31 ENCOUNTER — Encounter (HOSPITAL_BASED_OUTPATIENT_CLINIC_OR_DEPARTMENT_OTHER): Payer: Self-pay | Admitting: Family

## 2021-01-31 ENCOUNTER — Other Ambulatory Visit: Payer: Self-pay

## 2021-01-31 ENCOUNTER — Ambulatory Visit (INDEPENDENT_AMBULATORY_CARE_PROVIDER_SITE_OTHER): Payer: Medicare Other | Admitting: Family

## 2021-01-31 VITALS — BP 136/72 | HR 71 | Ht 63.0 in | Wt 174.0 lb

## 2021-01-31 DIAGNOSIS — I5032 Chronic diastolic (congestive) heart failure: Secondary | ICD-10-CM

## 2021-01-31 DIAGNOSIS — I779 Disorder of arteries and arterioles, unspecified: Secondary | ICD-10-CM | POA: Diagnosis not present

## 2021-01-31 DIAGNOSIS — I25118 Atherosclerotic heart disease of native coronary artery with other forms of angina pectoris: Secondary | ICD-10-CM

## 2021-01-31 DIAGNOSIS — I10 Essential (primary) hypertension: Secondary | ICD-10-CM

## 2021-01-31 DIAGNOSIS — Z01810 Encounter for preprocedural cardiovascular examination: Secondary | ICD-10-CM

## 2021-01-31 MED ORDER — IRBESARTAN 300 MG PO TABS: 300.0000 mg | ORAL_TABLET | Freq: Every day | ORAL | 3 refills | Status: DC

## 2021-01-31 MED ORDER — HYDRALAZINE HCL 100 MG PO TABS: 100.0000 mg | ORAL_TABLET | Freq: Three times a day (TID) | ORAL | 2 refills | Status: DC

## 2021-01-31 MED ORDER — METOPROLOL SUCCINATE ER 100 MG PO TB24: 100.0000 mg | ORAL_TABLET | Freq: Every day | ORAL | 3 refills | Status: DC

## 2021-01-31 NOTE — Patient Instructions (Addendum)
Medication Instructions:  Continue your current medications.   We will send refills of your Irbesartan, Metoprolol, Hydralazine.   *If you need a refill on your cardiac medications before your next appointment, please call your pharmacy*   Lab Work: None ordered today.   Testing/Procedures: Your EKG today shows normal sinus rhythm which is a great result!   Follow-Up: At [facility], you and your health needs are our priority.  As part of our continuing mission to provide you with exceptional heart care, we have created designated Provider Care Teams.  These Care Teams include your primary Cardiologist (physician) and Advanced Practice Providers (APPs -  Physician Assistants and Nurse Practitioners) who all work together to provide you with the care you need, when you need it.  We recommend signing up for the patient portal called "MyChart".  Sign up information is provided on this After Visit Summary.  MyChart is used to connect with patients for Virtual Visits (Telemedicine).  Patients are able to view lab/test results, encounter notes, upcoming appointments, etc.  Non-urgent messages can be sent to your provider as well.   To learn more about what you can do with MyChart, go to [email or web address].    Your next appointment:   As scheduled with Dr. Oval Linsey in August   Tips to Measure your Blood Pressure Correctly  To determine whether you have hypertension, a medical professional will take a blood pressure reading. How you prepare for the test, the position of your arm, and other factors can change a blood pressure reading by 10% or more. That could be enough to hide high blood pressure, start you on a drug you don't really need, or lead your doctor to incorrectly adjust your medications.  National and international guidelines offer specific instructions for measuring blood pressure. If a doctor, nurse, or medical assistant isn't doing it right, don't hesitate to ask him or her  to get with the guidelines.  Here's what you can do to ensure a correct reading:  Don't drink a caffeinated beverage or smoke during the 30 minutes before the test.  Sit quietly for five minutes before the test begins.  During the measurement, sit in a chair with your feet on the floor and your arm supported so your elbow is at about heart level.  The inflatable part of the cuff should completely cover at least 80% of your upper arm, and the cuff should be placed on bare skin, not over a shirt.  Don't talk during the measurement.  Have your blood pressure measured twice, with a brief break in between. If the readings are different by 5 points or more, have it done a third time.  In 2017, new guidelines from the [employer], the [employer] of Cardiology, and nine other health organizations lowered the diagnosis of high blood pressure to 130/80 mm Hg or higher for all adults. The guidelines also redefined the various blood pressure categories to now include normal, elevated, Stage 1 hypertension, Stage 2 hypertension, and hypertensive crisis (see "Blood pressure categories").  Blood pressure categories  Blood pressure category SYSTOLIC (upper number)  DIASTOLIC (lower number)  Normal Less than 120 mm Hg and Less than 80 mm Hg  Elevated 120-129 mm Hg and Less than 80 mm Hg  High blood pressure: Stage 1 hypertension 130-139 mm Hg or 80-89 mm Hg  High blood pressure: Stage 2 hypertension 140 mm Hg or higher or 90 mm Hg or higher  Hypertensive crisis (consult your doctor immediately) Higher  than 180 mm Hg and/or Higher than 120 mm Hg  Source: American Heart Association and American Stroke Association. For more on getting your blood pressure under control, buy Controlling Your Blood Pressure, a Special Health Report from [facility].   Blood Pressure Log   Date   Time  Blood Pressure  Position  Example: Nov 1 9 AM 124/78 sitting                                                     Heart Healthy Diet Recommendations: A low-salt diet is recommended. Meats should be grilled, baked, or boiled. Avoid fried foods. Focus on lean protein sources like fish or chicken with vegetables and fruits. The American Heart Association is a Microbiologist!  American Heart Association Diet and Lifeystyle Recommendations    Exercise recommendations: The American Heart Association recommends 150 minutes of moderate intensity exercise weekly. Try 30 minutes of moderate intensity exercise 4-5 times per week. This could include walking, jogging, or swimming.

## 2021-01-31 NOTE — Progress Notes (Signed)
Office Visit    Patient Name: [name] Date of Encounter: 01/31/2021  PCP:  Laurey Morale, MD   [facility]  Cardiologist:  Sherren Mocha, MD  Advanced Practice Provider:  Liliane Shi, PA-C Electrophysiologist:  None    Chief Complaint    [name] is a 76 y.o. female with a hx of coronary disease s/p DES to RCA in 2010, HFpEF, DM2, hypertension, hyperlipidemia, TIA, carotid artery disease s/p right CEA, GERD, lumbar spine disease, breast cancer s/p lumpectomy presents today for preoperative cardiovascular clearance  Past Medical History    Past Medical History:  Diagnosis Date   Abnormal nuclear stress test 12/29/2014   Allergy    Anemia, unspecified    Arthritis    Blood transfusion without reported diagnosis    2018   CAD (coronary artery disease)    sees. Dr. Burt Knack. s/p cath with PCI of RCA (xience des) June 2010. Pt also with LAD and D2 dzs-NL FFR in both areas med Rx   Carotid artery occlusion    Cataract    sees Dr. Herbert Deaner    Depressive disorder, not elsewhere classified    Duodenal nodule    Edema    Fibromyalgia    Gastric polyps    COLON   GERD (gastroesophageal reflux disease)    Glaucoma    narrow angle, sees Dr. Shelly Flatten)    Hearing loss    left ear   Heart murmur    questionable   Hemorrhoids    HSV (herpes simplex virus) anogenital infection 05/2014   HTN (hypertension)    Hyperlipidemia    Low back pain    she sees Dr. Louanne Skye, also Dr. Ernestina Patches for pain medications and Benjiman Core PA for injections    Obesity    Osteopenia 01/2018   T score -1.6 FRAX 7.1% / 1.4%   Peripheral vascular disease ([place])    Stroke ([place])    TIA  "many yrs ago"     TIA (transient ischemic attack)    also Hx of it.    Type II or unspecified type diabetes mellitus without mention of complication, not stated as uncontrolled    sees Dr. Carrolyn Meiers    Past Surgical History:  Procedure Laterality  Date   ABDOMINAL HYSTERECTOMY  age 19   TAH and USO  Leiomyomata   ANTERIOR CERVICAL CORPECTOMY N/A 06/11/2014   Procedure: ANTERIOR CERVICAL CORPECTOMY CERVICAL SIX; WITH CERVICAL FIVE TO CERVICAL SEVEN ARTHRODESIS;  Surgeon: Ashok Pall, MD;  Location: [place] [facility];  Service: Neurosurgery;  Laterality: N/A;   BACK SURGERY  2008   lumb fusion   BREAST BIOPSY Left 01/26/2014   Procedure: EXCISION LEFT BREAST MASS;  Surgeon: Adin Hector, MD;  Location: [place];  Service: General;  Laterality: Left;   BREAST LUMPECTOMY WITH RADIOACTIVE SEED LOCALIZATION Left 07/24/2017   Procedure: LEFT BREAST LUMPECTOMY WITH RADIOACTIVE SEED LOCALIZATION ERAS;  Surgeon: Erroll Luna, MD;  Location: [place];  Service: General;  Laterality: Left;   BREAST LUMPECTOMY WITH RADIOACTIVE SEED LOCALIZATION Left 01/13/2020   Procedure: LEFT BREAST LUMPECTOMY WITH RADIOACTIVE SEED LOCALIZATION;  Surgeon: Erroll Luna, MD;  Location: [place];  Service: General;  Laterality: Left;   CARDIAC CATHETERIZATION     CARDIAC CATHETERIZATION N/A 12/29/2014   Procedure: Left Heart Cath and Coronary Angiography;  Surgeon: Sherren Mocha, MD;  Location: [place] [facility];  Service:  Cardiovascular;  Laterality: N/A;   CAROTID ENDARTERECTOMY  march 2012   per Dr. Morton Amy   CARPAL TUNNEL RELEASE Left 05/14/2014   Procedure: Left Carpal tunnel release;  Surgeon: Ashok Pall, MD;  Location: [place] [facility];  Service: Neurosurgery;  Laterality: Left;  Left Carpal tunnel release   CARPAL TUNNEL RELEASE Bilateral    CHOLECYSTECTOMY     COLONOSCOPY  06/26/2017   per Dr. Henrene Pastor, adenomatous polyp, repeat in 5 yrs     CORONARY STENT PLACEMENT     Dr. Burt Knack   DILATION AND CURETTAGE OF UTERUS     ESOPHAGOGASTRODUODENOSCOPY  02-08-06   per Dr. Henrene Pastor, gastritis    EYE SURGERY Left June 2016   Cataract   GLAUCOMA SURGERY     with laser, per Dr. Henrene Pastor    HARDWARE REMOVAL   10/30/2016   Procedure: HARDWARE REMOVAL;  Surgeon: Jessy Oto, MD;  Location: [facility];  Service: Orthopedics;;   LUMBAR FUSION  2008   POLYPECTOMY     POSTERIOR LUMBAR FUSION  10/30/2016   Removal of hardware rods and pedicle screws lumbar four, Extension of fusion L4-5 to L5-S1 with reinsertion of screws at L 4, left L5-S1 transforaminal lumbar interbody fusion, Exploration  left L5 nerve root, bilateral decompressive laminectomy L3-4/notes 10/30/2016   ULNAR NERVE TRANSPOSITION Left 05/14/2014   Procedure: Left Ulnar nerve decompression;  Surgeon: Ashok Pall, MD;  Location: [place] [facility];  Service: Neurosurgery;  Laterality: Left;  Left Ulnar nerve decompression    Allergies  No Known Allergies  History of Present Illness    [name] is a 75 y.o. female with a hx of coronary disease s/p DES to RCA in 2010, HFpEF, DM2, hypertension, hyperlipidemia, TIA, carotid artery disease s/p right CEA, GERD, lumbar spine disease, breast cancer s/p lumpectomy last seen 09/21/2018 by Richardson Dopp, PA.  Her coronary artery disease dates back to 2010 with unstable angina leading to DES to RCA.  She underwent cardiac catheterization in June 2016 with moderate nonobstructive disease in the LAD, RCA stent patent.  Echocardiogram 12/2014 LVEF 60 to 65% with grade 2 diastolic dysfunction.  She had MRI October 2021 showing rare remote lacunar infarcts in the right caudate and bilateral corona radiata.  She was last seen 09/20/2020 by Richardson Dopp, PA doing well from cardiac perspective.  No changes were made.  She presents today for follow-up.  She went for surgery at [facility] and was noted to have markedly elevated blood pressure.  She was recommended for follow-up with our office prior to proceeding with surgery. Tells me they tried to do back surgery last Friday. She is not sure whether it was nerves or anxiety that elevated her blood pressure. Tells me ever since her medications were changed by Dr.  Oval Linsey her blood pressure has been well controlled. She has not been checking at home as her blood pressure cuff has not been working.   EKGs/Labs/Other Studies Reviewed:   The following studies were reviewed today:  Carotid US 03/30/2020 Bilateral ICA 1-39   LHC 12/29/14 LM:  Ostial 30 LAD: prox 50, dist 50; D1 60 LCx:  lum irregs RCA:  prox 30, mid stent ok Med Rx   Echo 12/30/14 EF 60% to 65%. Wall motion was normal; Grade 2 diastolic dysfunction   Myoview 12/20/14 Possible small defect in the inferolateral wall. Ischemia cannot be completely ruled out but there is significant diaphragmatic and soft tissue attenuation on RAW images noted. This is a low risk  study. EF55-65%    Renal Art [place] 10/29/11 Normal renal arteries bilaterally  EKG:  EKG is  ordered today.  The ekg ordered today demonstrates NSR 71 bpm with no acute ST/T wave changes.   Recent Labs: 03/09/2020: TSH 2.60 03/23/2020: ALT 21; BUN 16; Creatinine, Ser 1.08; Hemoglobin 13.1; Platelets 177; Potassium 4.3; Sodium 142  Recent Lipid Panel    Component Value Date/Time   CHOL 103 03/09/2020 1043   TRIG 71 03/09/2020 1043   HDL 38 (L) 03/09/2020 1043   CHOLHDL 2.7 03/09/2020 1043   VLDL 33.6 02/20/2019 1022   LDLCALC 50 03/09/2020 1043    Home Medications   Current Meds  Medication Sig   ACCU-CHEK GUIDE test strip 1 each by Other route in the morning and at bedtime.   aspirin EC 81 MG tablet Take 1 tablet (81 mg total) by mouth daily. Swallow whole.   cetirizine (ZYRTEC) 10 MG tablet Take 1 tablet (10 mg total) by mouth daily.   cyclobenzaprine (FLEXERIL) 10 MG tablet Take 1 tablet (10 mg total) by mouth 3 (three) times daily as needed for muscle spasms.   diclofenac sodium (VOLTAREN) 1 % GEL Apply 2 g topically 4 (four) times daily as needed (for pain).   ergocalciferol (VITAMIN D2) 50000 UNITS capsule Take 50,000 Units by mouth every Saturday. On Saturday.   FARXIGA 10 MG TABS tablet Take 10 mg by mouth every  morning.   furosemide (LASIX) 40 MG tablet Take 40 mg by mouth daily.   HUMALOG 100 UNIT/ML injection    HYDROcodone-homatropine (HYCODAN) 5-1.5 MG/5ML syrup Take 5 mLs by mouth every 4 (four) hours as needed for cough.   ibuprofen (ADVIL) 600 MG tablet Take 1 tablet (600 mg total) by mouth every 6 (six) hours as needed.   Insulin Disposable Pump (V-GO 40) KIT Inject 1 application into the skin See admin instructions.   Insulin Human (INSULIN PUMP) SOLN Inject into the skin. Uses Novolog insulin for pump   isosorbide mononitrate (IMDUR) 60 MG 24 hr tablet TAKE 1 TABLET BY MOUTH ONCE DAILY -  PLEASE  KEEP  UPCOMING  APPOINTMENTIN  JULY  BEFORE  ANYMORE  REFILLS   latanoprost (XALATAN) 0.005 % ophthalmic solution Place 1 drop into both eyes at bedtime.    montelukast (SINGULAIR) 10 MG tablet Take 10 mg by mouth daily.   nitroGLYCERIN (NITROSTAT) 0.4 MG SL tablet Place 1 tablet (0.4 mg total) under the tongue every 5 (five) minutes as needed for chest pain.   nystatin-triamcinolone (MYCOLOG II) cream Apply 1 application topically 2 (two) times daily.   OZEMPIC, 1 MG/DOSE, 4 MG/3ML SOPN Inject 1 mg into the skin once a week.   pantoprazole (PROTONIX) 40 MG tablet Take 1 tablet by mouth once daily   rosuvastatin (CRESTOR) 40 MG tablet Take 1 tablet by mouth once daily   spironolactone (ALDACTONE) 50 MG tablet Take 1 tablet by mouth once daily   traMADol (ULTRAM) 50 MG tablet 50 mg continuously as needed   [DISCONTINUED] hydrALAZINE (APRESOLINE) 100 MG tablet TAKE 1 TABLET BY MOUTH THREE TIMES DAILY   [DISCONTINUED] irbesartan (AVAPRO) 300 MG tablet TAKE 1 TABLET BY MOUTH ONCE DAILY(APPT REQUIRED FOR FUTURE REFILLS)   [DISCONTINUED] metoprolol succinate (TOPROL-XL) 100 MG 24 hr tablet Take 1 tablet (100 mg total) by mouth daily. Pt needs to keep upcoming appt for further refills     Review of Systems      All other systems reviewed and are otherwise negative except as  noted above.  Physical Exam     VS:  BP 136/72 (BP Location: Right Arm, Patient Position: Sitting, Cuff Size: Normal)   Pulse 71   Ht _0  (1.6 m)   Wt 174 lb (78.9 kg)   BMI 30.82 kg/m  , BMI Body mass index is 30.82 kg/m.  Wt Readings from Last 3 Encounters:  01/31/21 174 lb (78.9 kg)  11/03/20 171 lb 12.8 oz (77.9 kg)  09/20/20 172 lb 12.8 oz (78.4 kg)     GEN: Well nourished, well developed, in no acute distress. HEENT: normal. Neck: Supple, no JVD, carotid bruits, or masses. Cardiac: RRR, no murmurs, rubs, or gallops. No clubbing, cyanosis, edema.  Radials/PT 2+ and equal bilaterally.  Respiratory:  Respirations regular and unlabored, clear to auscultation bilaterally. GI: Soft, nontender, nondistended. MS: No deformity or atrophy. Skin: Warm and dry, no rash. Neuro:  Strength and sensation are intact. Psych: Normal affect.  Assessment & Plan    Preoperative cardiovascular examination -EKG today normal sinus rhythm no acute ST/T wave changes.  She has no anginal symptoms.  Her volume status is well controlled with as needed furosemide.  She presented for spinal surgery and was noted to have markedly elevated blood pressure.  Her blood pressure today in clinic was 112/66 and 136/72 on recheck, well controlled.  Anticipate anxiety was contributory to elevated blood pressure.  I would recommend she take all of her antihypertensive agents on her day of surgery.  She may hold aspirin as directed by her spinal surgeon.  She was provided with a home blood pressure cuff today for monitoring of blood pressure at home and will report blood pressure consistently greater than 130/80.  She is deemed acceptable risk for the planned procedure and may proceed without additional cardiovascular testing.  I will provide her a printed copy of this note to provide to her surgeon and also fax to surgeon.  CAD s/p DES to RCA in 2010 with patent stent by cardiac catheterization 2016 to moderate nonobstructive disease in LAD- Stable  with no anginal symptoms. No indication for ischemic evaluation.  EKG today with no acute ST/T wave changes.  GDMT includes metoprolol, rosuvastatin, Imdur, aspirin. Heart healthy diet and regular cardiovascular exercise encouraged.    HFpEF-echocardiogram 2016 with normal LVEF and moderate diastolic dysfunction.  We discussed diastolic heart failure and need for low-salt diet, restrict fluid to less than 2 L.  She reports her fluid is well controlled and only takes Lasix as needed.  Her GDMT includes [place].  Bilateral carotid artery stenosis s/p right CEA-carotid ultrasound 03/2020 with 1-39% bilaterally.  Denies amaurosis fugax.  Continue present dose rosuvastatin and aspirin.  Hypertension- BP well controlled. Continue current antihypertensive regimen. Refills provided as needed.  Provided home blood pressure cuff for her today.  She will monitor at home and contact her office if her blood pressures routinely greater than 130/80.  Hyperlipidemia-  Continue Rosuvastatin 47m QD.   Disposition: Follow up as scheduled in August with Dr. ROval Linsey Signed, CLoel Dubonnet NP 01/31/2021, 9:34 AM [place]

## 2021-02-01 ENCOUNTER — Telehealth: Payer: Self-pay | Admitting: Family

## 2021-02-01 NOTE — Telephone Encounter (Signed)
LM for the pt that all has been sent and to let us know if she needs any further assistance.

## 2021-02-01 NOTE — Telephone Encounter (Signed)
New message:     Patient calling stating that Gilford Rile was going to seen over some paper work and the patient want to make sure she has a good fax number. [phone] please call patient.

## 2021-02-01 NOTE — Telephone Encounter (Signed)
Faxed office note from yesterday which includes cardiac clearance to the fax number provided. I have also faxed it to her neurosurgeon directly as well as the fax number provided on the initial clearance. The patient was also provided a physical copy of my office note including clearance during clinic visit yesterday.   Appreciate her calling with fax number! Loel Dubonnet, NP

## 2021-02-06 ENCOUNTER — Telehealth: Payer: Self-pay | Admitting: Family Medicine

## 2021-02-06 NOTE — Telephone Encounter (Signed)
Patient called stating that she was told that Dr. Sarajane Jews was supposed to send in an order to Dr. Ruta Hinds at [facility].  Please advise.

## 2021-02-06 NOTE — Telephone Encounter (Signed)
PT called to advise that she got a letter stating that she no showed her July 20th apt and she states that's not true and wants to talk. PT states she did talk to Dr.Fry that day.

## 2021-02-06 NOTE — Telephone Encounter (Signed)
Order for Carotid vascular ultrasound was placed on 01/25/21.   Please advise if this is correct order.

## 2021-02-06 NOTE — Telephone Encounter (Signed)
Please advise if 01/25/21 video visit was complete.

## 2021-02-07 NOTE — Telephone Encounter (Signed)
She is correct. She had a video visit with me that day

## 2021-02-07 NOTE — Telephone Encounter (Signed)
No need to see Dr. Oneida Alar again unless the carotid US looks bad. We will wait to see this result

## 2021-02-08 NOTE — Telephone Encounter (Signed)
noted 

## 2021-02-08 NOTE — Telephone Encounter (Signed)
Send a MyChart message to pt with update on her [place] orders

## 2021-02-08 NOTE — Telephone Encounter (Signed)
Spoke with [name] stated that the reason she was calling the office back was to notify that Dr Eden Lathe office did not receive [name] [place] order, advised [name] that I would fax the orders again. Left [name] a detailed message on her mobile phone

## 2021-02-10 ENCOUNTER — Telehealth: Payer: Self-pay | Admitting: Cardiovascular Disease

## 2021-02-10 NOTE — Telephone Encounter (Signed)
Spoke with the patient. She stated that [facility] has not received the cardiac clearance as of yet. She has been made aware that this was faxed on 7/27.  She asked that this be faxed again to [phone] to the attention of Abigail Butts and Dr. Ples Specter.  The patient has been made aware that the fax went through successfully.

## 2021-02-10 NOTE — Telephone Encounter (Signed)
Patient is calling to relay a message from her other doctor to Dr. Oval Linsey. Also wants to the correct fax number. Please call back

## 2021-02-13 ENCOUNTER — Other Ambulatory Visit (HOSPITAL_COMMUNITY): Payer: Self-pay | Admitting: Cardiology

## 2021-03-03 NOTE — Progress Notes (Signed)
Advanced Hypertension Clinic Follow-up   Date:  03/06/2021   ID:  Karen, [name] 1946-05-03, MRN [id number]  PCP:  Laurey Morale, MD  Cardiologist:  Sherren Mocha, MD  Nephrologist:  Referring MD: Laurey Morale, MD   CC: Hypertension  History of Present Illness:    [name] is a 75 y.o. female with a hx of CAD s/p RCA PCI, mild carotid stenosis, stroke, hypertension, hyperlipidemia, GERD, diabetes, here for follow-up. She initially established care in the hypertension clinic 05/07/2019.  She had an RCA PCI in 2010.  Repeat heart catheterization in 2016 showed that her stent was patent.  There was 50% LAD stenosis and 60% D1. She reports having high blood pressure for 20-30 years. It has been intermittently controlled.  She did cardiac rehab after her PCI and noted that it was better controlled when in cardiac rehab. It was also better controlled when she was exercising more. Her exercise was limited after having a back surgery. She had renal artery Dopplers that were normal bilaterally in 2013.  Her blood pressure has always been labile.  She does not check it regularly at home because she does not have a machine.  She saw Richardson Dopp, PA-C on 04/28/19. Her BP was poorly controlled. Spironolactone was increased. Doxazosin was added to her regimen. She stopped it due to swelling. She followed up with our pharmacist and was noted to have some problems with compliance. They also split up her medication doses. She saw Richardson Dopp, PA-C 09/2020 and her blood pressure was well-controlled. It was also controlled when she saw Laurann Montana, NP 01/2021.  Today, she is feeling good overall. Recently she had been scheduled for back surgery. On the day of the procedure, her blood pressure was 256, and lowered to 208 after 10-15 minutes. The neurosurgeon would not perform the surgery unless her blood pressure was below 180. She took her medication about 2 hours prior to the proposed  time of surgery, and was told to not take Spironolactone and Irbesartan. Lately, her blood pressure at home has been fluctuating 110s-170s/50s-60s, and averaging in the 130s-140s. She endorses some LE edema at times. Now she is rescheduled for surgery on 9/26. Of note, prior to re-checking her blood pressure, our visit was interrupted by an emergency evacuation. She denies any palpitations, chest pain, or shortness of breath. No lightheadedness, headaches, syncope, orthopnea, or PND.    Current BP Regimen: Hydralazine 100 mg tid laisx 21m daily Irbesartan 300 mg daily Imdur 966mdaily Metoprolol succinate 10079maily Spironolactone 57m78mily  Previous antihypertensives: Amlodipine Carvedilol- swelling Clonidine- doesn't remember the side effect Doxazosin - Swelling  Past Medical History:  Diagnosis Date   Abnormal nuclear stress test 12/29/2014   Allergy    Anemia, unspecified    Arthritis    Blood transfusion without reported diagnosis    2018   CAD (coronary artery disease)    sees. Dr. CoopBurt Knackp cath with PCI of RCA (xience des) June 2010. Pt also with LAD and D2 dzs-NL FFR in both areas med Rx   Carotid artery occlusion    Cataract    sees Dr. HeckHerbert DeanerDepressive disorder, not elsewhere classified    Duodenal nodule    Edema    Fibromyalgia    Gastric polyps    COLON   GERD (gastroesophageal reflux disease)    Glaucoma    narrow angle, sees Dr. HeckShelly Flatten Hearing  loss    left ear   Heart murmur    questionable   Hemorrhoids    HSV (herpes simplex virus) anogenital infection 05/2014   HTN (hypertension)    Hyperlipidemia    Low back pain    she sees Dr. Louanne Skye, also Dr. Ernestina Patches for pain medications and Benjiman Core PA for injections    Obesity    Osteopenia 01/2018   T score -1.6 FRAX 7.1% / 1.4%   Peripheral vascular disease ([place])    Resistant hypertension 02/21/2007   Qualifier: Diagnosis of  By: Tiney Rouge CMA, Ellison Hughs     Stroke [facility])     TIA  "many yrs ago"     TIA (transient ischemic attack)    also Hx of it.    Type II or unspecified type diabetes mellitus without mention of complication, not stated as uncontrolled    sees Dr. Carrolyn Meiers     Past Surgical History:  Procedure Laterality Date   ABDOMINAL HYSTERECTOMY  age 73   TAH and USO  Leiomyomata   ANTERIOR CERVICAL CORPECTOMY N/A 06/11/2014   Procedure: ANTERIOR CERVICAL CORPECTOMY CERVICAL SIX; WITH CERVICAL FIVE TO CERVICAL SEVEN ARTHRODESIS;  Surgeon: Ashok Pall, MD;  Location: [place] [facility];  Service: Neurosurgery;  Laterality: N/A;   BACK SURGERY  2008   lumb fusion   BREAST BIOPSY Left 01/26/2014   Procedure: EXCISION LEFT BREAST MASS;  Surgeon: Adin Hector, MD;  Location: [place];  Service: General;  Laterality: Left;   BREAST LUMPECTOMY WITH RADIOACTIVE SEED LOCALIZATION Left 07/24/2017   Procedure: LEFT BREAST LUMPECTOMY WITH RADIOACTIVE SEED LOCALIZATION ERAS;  Surgeon: Erroll Luna, MD;  Location: [place];  Service: General;  Laterality: Left;   BREAST LUMPECTOMY WITH RADIOACTIVE SEED LOCALIZATION Left 01/13/2020   Procedure: LEFT BREAST LUMPECTOMY WITH RADIOACTIVE SEED LOCALIZATION;  Surgeon: Erroll Luna, MD;  Location: [place];  Service: General;  Laterality: Left;   CARDIAC CATHETERIZATION     CARDIAC CATHETERIZATION N/A 12/29/2014   Procedure: Left Heart Cath and Coronary Angiography;  Surgeon: Sherren Mocha, MD;  Location: [place] [facility];  Service: Cardiovascular;  Laterality: N/A;   CAROTID ENDARTERECTOMY  march 2012   per Dr. Morton Amy   CARPAL TUNNEL RELEASE Left 05/14/2014   Procedure: Left Carpal tunnel release;  Surgeon: Ashok Pall, MD;  Location: [place] [facility];  Service: Neurosurgery;  Laterality: Left;  Left Carpal tunnel release   CARPAL TUNNEL RELEASE Bilateral    CHOLECYSTECTOMY     COLONOSCOPY  06/26/2017   per Dr. Henrene Pastor, adenomatous polyp, repeat in 5 yrs      CORONARY STENT PLACEMENT     Dr. Burt Knack   DILATION AND CURETTAGE OF UTERUS     ESOPHAGOGASTRODUODENOSCOPY  02-08-06   per Dr. Henrene Pastor, gastritis    EYE SURGERY Left June 2016   Cataract   GLAUCOMA SURGERY     with laser, per Dr. Henrene Pastor    HARDWARE REMOVAL  10/30/2016   Procedure: HARDWARE REMOVAL;  Surgeon: Jessy Oto, MD;  Location: [place];  Service: Orthopedics;;   LUMBAR FUSION  2008   POLYPECTOMY     POSTERIOR LUMBAR FUSION  10/30/2016   Removal of hardware rods and pedicle screws lumbar four, Extension of fusion L4-5 to L5-S1 with reinsertion of screws at L 4, left L5-S1 transforaminal lumbar interbody fusion, Exploration  left L5 nerve root, bilateral decompressive laminectomy L3-4/notes 10/30/2016   ULNAR NERVE TRANSPOSITION Left 05/14/2014   Procedure:  Left Ulnar nerve decompression;  Surgeon: Ashok Pall, MD;  Location: [place] [facility];  Service: Neurosurgery;  Laterality: Left;  Left Ulnar nerve decompression    Current Medications: Current Meds  Medication Sig   ACCU-CHEK GUIDE test strip 1 each by Other route in the morning and at bedtime.   aspirin EC 81 MG tablet Take 1 tablet (81 mg total) by mouth daily. Swallow whole.   cetirizine (ZYRTEC) 10 MG tablet Take 1 tablet (10 mg total) by mouth daily.   chlorthalidone (HYGROTON) 25 MG tablet Take 1 tablet (25 mg total) by mouth daily.   cyclobenzaprine (FLEXERIL) 10 MG tablet Take 1 tablet (10 mg total) by mouth 3 (three) times daily as needed for muscle spasms.   diclofenac sodium (VOLTAREN) 1 % GEL Apply 2 g topically 4 (four) times daily as needed (for pain).   ergocalciferol (VITAMIN D2) 50000 UNITS capsule Take 50,000 Units by mouth every Saturday. On Saturday.   FARXIGA 10 MG TABS tablet Take 10 mg by mouth every morning.   HUMALOG 100 UNIT/ML injection    hydrALAZINE (APRESOLINE) 100 MG tablet Take 1 tablet (100 mg total) by mouth 3 (three) times daily.   HYDROcodone-homatropine (HYCODAN) 5-1.5 MG/5ML syrup Take 5 mLs by  mouth every 4 (four) hours as needed for cough.   ibuprofen (ADVIL) 600 MG tablet Take 1 tablet (600 mg total) by mouth every 6 (six) hours as needed.   Insulin Disposable Pump (V-GO 40) KIT Inject 1 application into the skin See admin instructions.   Insulin Human (INSULIN PUMP) SOLN Inject into the skin. Uses Novolog insulin for pump   irbesartan (AVAPRO) 300 MG tablet Take 1 tablet (300 mg total) by mouth daily.   isosorbide mononitrate (IMDUR) 60 MG 24 hr tablet TAKE 1 TABLET BY MOUTH ONCE DAILY -  PLEASE  KEEP  UPCOMING  APPOINTMENTIN  JULY  BEFORE  ANYMORE  REFILLS   latanoprost (XALATAN) 0.005 % ophthalmic solution Place 1 drop into both eyes at bedtime.    metoprolol succinate (TOPROL-XL) 100 MG 24 hr tablet Take 1 tablet (100 mg total) by mouth daily.   montelukast (SINGULAIR) 10 MG tablet Take 10 mg by mouth daily.   nitroGLYCERIN (NITROSTAT) 0.4 MG SL tablet Place 1 tablet (0.4 mg total) under the tongue every 5 (five) minutes as needed for chest pain.   nystatin-triamcinolone (MYCOLOG II) cream Apply 1 application topically 2 (two) times daily.   OZEMPIC, 1 MG/DOSE, 4 MG/3ML SOPN Inject 1 mg into the skin once a week.   pantoprazole (PROTONIX) 40 MG tablet Take 1 tablet by mouth once daily   rosuvastatin (CRESTOR) 40 MG tablet Take 1 tablet by mouth once daily   spironolactone (ALDACTONE) 50 MG tablet Take 1 tablet by mouth once daily   traMADol (ULTRAM) 50 MG tablet 50 mg continuously as needed   [DISCONTINUED] furosemide (LASIX) 40 MG tablet Take 40 mg by mouth daily.     Allergies:   Patient has no known allergies.   Social History   Socioeconomic History   Marital status: Married    Spouse name: Not on file   Number of children: 1   Years of education: 16   Highest education level: Bachelor's degree (e.g., BA, AB, BS)  Occupational History   Not on file  Tobacco Use   Smoking status: Former    Types: Cigarettes    Quit date: 01/22/1967    Years since quitting: 54.1    Smokeless tobacco: Never  Vaping  Use   Vaping Use: Never used  Substance and Sexual Activity   Alcohol use: No    Alcohol/week: 0.0 standard drinks   Drug use: No   Sexual activity: Not Currently    Birth control/protection: Surgical, Post-menopausal    Comment: HYST-1st intercourse 60 yo-5 partners  Other Topics Concern   Not on file  Social History Narrative   Not on file   Social Determinants of Health   Financial Resource Strain: Not on file  Food Insecurity: Not on file  Transportation Needs: Not on file  Physical Activity: Not on file  Stress: Not on file  Social Connections: Not on file     Family History: [name] family history includes Breast cancer (age of onset: 16) in her cousin; Breast cancer (age of onset: 36) in her maternal aunt; Breast cancer (age of onset: 59) in her cousin; Breast cancer (age of onset: 89) in her cousin; Cancer in her brother, father, and maternal aunt; Diabetes in her brother; Esophageal cancer in her cousin; Heart disease in her brother and maternal grandfather; Hyperlipidemia in her sister; Hypertension in her brother, mother, and sister; Prostate cancer (age of onset: 62) in her cousin; Stomach cancer (age of onset: 44) in her maternal grandmother; Varicose Veins in her brother. There is no history of Coronary artery disease, Colon cancer, Colon polyps, or Rectal cancer.  ROS:   Please see the history of present illness.    (+) LE edema All other systems reviewed and are negative.  EKGs/Labs/Other Studies Reviewed:    EKG:   03/06/2021: EKG is not ordered today. 05/07/2019: EKG is not ordered.   04/28/19: sinus bradycardia.  Rate 59 bpm.  LHC 12/29/14 LM:  Ostial 30 LAD: prox 50, dist 50; D1 60 LCx:  lum irregs RCA:  prox 30, mid stent ok Med Rx   Echo 12/30/14 EF 60% to 65%. Wall motion was normal; Grade 2 diastolic dysfunction     Recent Labs: 03/09/2020: TSH 2.60 03/23/2020: ALT 21; BUN 16; Creatinine, Ser 1.08;  Hemoglobin 13.1; Platelets 177; Potassium 4.3; Sodium 142   Recent Lipid Panel    Component Value Date/Time   CHOL 103 03/09/2020 1043   TRIG 71 03/09/2020 1043   HDL 38 (L) 03/09/2020 1043   CHOLHDL 2.7 03/09/2020 1043   VLDL 33.6 02/20/2019 1022   LDLCALC 50 03/09/2020 1043    Physical Exam:    VS:  BP (!) 174/80 (BP Location: Right Arm, Patient Position: Sitting)   Pulse (!) 56   Ht 5' 3"  (1.6 m)   Wt 178 lb 9.6 oz (81 kg)   SpO2 100%   BMI 31.64 kg/m     Wt Readings from Last 3 Encounters:  03/06/21 178 lb 9.6 oz (81 kg)  01/31/21 174 lb (78.9 kg)  11/03/20 171 lb 12.8 oz (77.9 kg)     VS:  BP (!) 174/80 (BP Location: Right Arm, Patient Position: Sitting)   Pulse (!) 56   Ht 5' 3"  (1.6 m)   Wt 178 lb 9.6 oz (81 kg)   SpO2 100%   BMI 31.64 kg/m  , BMI Body mass index is 31.64 kg/m. GENERAL:  Well appearing HEENT: Pupils equal round and reactive, fundi not visualized, oral mucosa unremarkable NECK:  No jugular venous distention, waveform within normal limits, carotid upstroke brisk and symmetric, +R carotid bruits, no thyromegaly LYMPHATICS:  No cervical adenopathy LUNGS:  Clear to auscultation bilaterally HEART:  RRR.  PMI not displaced or sustained,S1 and  S2 within normal limits, no S3, no S4, no clicks, no rubs, no murmurs ABD:  Flat, positive bowel sounds normal in frequency in pitch, no bruits, no rebound, no guarding, no midline pulsatile mass, no hepatomegaly, no splenomegaly EXT:  2 plus pulses throughout, no edema, no cyanosis no clubbing SKIN:  No rashes no nodules NEURO:  Cranial nerves II through XII grossly intact, motor grossly intact throughout PSYCH:  Cognitively intact, oriented to person place and time   ASSESSMENT:    1. Essential hypertension   2. Chronic diastolic CHF (congestive heart failure) ([place])   3. Therapeutic drug monitoring   4. Dyslipidemia   5. Resistant hypertension     PLAN:   Chronic diastolic CHF (congestive heart  failure) ([place]) She is euvolemic.  Blood pressures remain above goal.  We will switch Lasix to chlorthalidone 25 mg daily.  Check a basic metabolic panel in a week.  Resistant hypertension Blood pressure remains above goal, though her blood pressure readings at home have averaged in the 624E systolic.  She is transiently as high as the 170s.  Today her blood pressure was initially better controlled but not quite at goal.  On repeat it was much more elevated.  However, this was after we had a fire drill in the office, which likely contributed.  On the day of her surgery her blood pressure was extraordinarily high.  However it was noted that her presurgical guidelines recommended that she hold both her spironolactone and irbesartan.  I recommend that next time when she goes for her surgery she take all of her antihypertensives as prescribed.  The risk of her developing hypotension is very low.  We will switch her Lasix to chlorthalidone for improved blood pressure control.  She will keep checking her pressures.  We will follow-up in 2 months.  At that time we will do a more thorough assessment of secondary causes including considering a sleep study repeating her thyroid function, renal Dopplers, and catecholamines/metanephrines given her labile hypertension.  Would also check renin and aldosterone.  This would require holding some of her antihypertensives, which I think is a bad idea while we are trying to get her optimized for surgery.  All  Dyslipidemia Continue rosuvastatin.  She is due for fasting lipids and a CMP.   We will get this after she returns for follow-up labs after starting chlorthalidone.    Disposition:    F/u with Keryn Nessler C. Oval Linsey, MD, [facility] in 2 months.    Medication Adjustments/Labs and Tests Ordered: Current medicines are reviewed at length with the patient today.  Concerns regarding medicines are outlined above.  Orders Placed This Encounter  Procedures   Lipid panel    Comprehensive metabolic panel    Meds ordered this encounter  Medications   chlorthalidone (HYGROTON) 25 MG tablet    Sig: Take 1 tablet (25 mg total) by mouth daily.    Dispense:  90 tablet    Refill:  3    I,Mathew Stumpf,acting as a scribe for Skeet Latch, MD.,have documented all relevant documentation on the behalf of Skeet Latch, MD,as directed by  Skeet Latch, MD while in the presence of Skeet Latch, MD.  I, [place] Oval Linsey, MD have reviewed all documentation for this visit.  The documentation of the exam, diagnosis, procedures, and orders on 03/06/2021 are all accurate and complete.   Signed, Skeet Latch, MD  03/06/2021 12:06 PM    [facility]

## 2021-03-06 ENCOUNTER — Ambulatory Visit (INDEPENDENT_AMBULATORY_CARE_PROVIDER_SITE_OTHER): Payer: Medicare Other | Admitting: Cardiovascular Disease

## 2021-03-06 ENCOUNTER — Encounter (HOSPITAL_BASED_OUTPATIENT_CLINIC_OR_DEPARTMENT_OTHER): Payer: Self-pay | Admitting: Cardiovascular Disease

## 2021-03-06 ENCOUNTER — Other Ambulatory Visit: Payer: Self-pay

## 2021-03-06 VITALS — BP 174/80 | HR 56 | Ht 63.0 in | Wt 178.6 lb

## 2021-03-06 DIAGNOSIS — Z5181 Encounter for therapeutic drug level monitoring: Secondary | ICD-10-CM | POA: Diagnosis not present

## 2021-03-06 DIAGNOSIS — I10 Essential (primary) hypertension: Secondary | ICD-10-CM | POA: Diagnosis not present

## 2021-03-06 DIAGNOSIS — I2510 Atherosclerotic heart disease of native coronary artery without angina pectoris: Secondary | ICD-10-CM | POA: Diagnosis not present

## 2021-03-06 DIAGNOSIS — E785 Hyperlipidemia, unspecified: Secondary | ICD-10-CM | POA: Diagnosis not present

## 2021-03-06 DIAGNOSIS — I5032 Chronic diastolic (congestive) heart failure: Secondary | ICD-10-CM | POA: Diagnosis not present

## 2021-03-06 DIAGNOSIS — I1A0 Resistant hypertension: Secondary | ICD-10-CM

## 2021-03-06 MED ORDER — CHLORTHALIDONE 25 MG PO TABS: 25.0000 mg | ORAL_TABLET | Freq: Every day | ORAL | 3 refills | Status: DC

## 2021-03-06 NOTE — Assessment & Plan Note (Signed)
She is euvolemic.  Blood pressures remain above goal.  We will switch Lasix to chlorthalidone 25 mg daily.  Check a basic metabolic panel in a week.

## 2021-03-06 NOTE — Assessment & Plan Note (Signed)
Blood pressure remains above goal, though her blood pressure readings at home have averaged in the 0000000 systolic.  She is transiently as high as the 170s.  Today her blood pressure was initially better controlled but not quite at goal.  On repeat it was much more elevated.  However, this was after we had a fire drill in the office, which likely contributed.  On the day of her surgery her blood pressure was extraordinarily high.  However it was noted that her presurgical guidelines recommended that she hold both her spironolactone and irbesartan.  I recommend that next time when she goes for her surgery she take all of her antihypertensives as prescribed.  The risk of her developing hypotension is very low.  We will switch her Lasix to chlorthalidone for improved blood pressure control.  She will keep checking her pressures.  We will follow-up in 2 months.  At that time we will do a more thorough assessment of secondary causes including considering a sleep study repeating her thyroid function, renal Dopplers, and catecholamines/metanephrines given her labile hypertension.  Would also check renin and aldosterone.  This would require holding some of her antihypertensives, which I think is a bad idea while we are trying to get her optimized for surgery.  All

## 2021-03-06 NOTE — Patient Instructions (Addendum)
Medication Instructions:  STOP FUROSEMIDE  START CHLORTHALIDONE 25 MG DAILY   Labwork: FASTING LP/CMET IN 1 WEEK   Testing/Procedures: NONE  Follow-Up: 05/02/2021 AT 9:15 AM WITH DR [facility]

## 2021-03-06 NOTE — Assessment & Plan Note (Signed)
Continue rosuvastatin.  She is due for fasting lipids and a CMP.   We will get this after she returns for follow-up labs after starting chlorthalidone.

## 2021-03-08 ENCOUNTER — Telehealth: Payer: Self-pay | Admitting: Cardiovascular Disease

## 2021-03-08 NOTE — Telephone Encounter (Signed)
Called patient left message on personal voice mail I will send this message to [place].She is out of office this afternoon.She will call you back tomorrow.

## 2021-03-08 NOTE — Telephone Encounter (Signed)
Follow up:     Patient calling to check the status of last message. Please fax to [phone]

## 2021-03-08 NOTE — Telephone Encounter (Signed)
Patient states during her visit on 03/06/21 she brought in a chart with all her BP readings for the past month. She requested to have this sent to Dr. Dava Najjar with [facility], but she states they have not received anything. She would like to know if someone can sent these reading over soon. (Attn: Roby Lofts.) Please return call to patient to confirm when the fax has been sent.  If questions, [place] may be contacted at [phone].

## 2021-03-09 NOTE — Telephone Encounter (Signed)
Faxed, confirmation received  Left detailed message, ok per [facility]

## 2021-03-21 ENCOUNTER — Ambulatory Visit (HOSPITAL_COMMUNITY)
Admission: RE | Admit: 2021-03-21 | Discharge: 2021-03-21 | Disposition: A | Discharge status: Home / Self Care | Payer: Medicare Other | Source: Ambulatory Visit | Attending: Family Medicine | Admitting: Family Medicine

## 2021-03-21 ENCOUNTER — Other Ambulatory Visit: Payer: Self-pay

## 2021-03-21 DIAGNOSIS — I779 Disorder of arteries and arterioles, unspecified: Secondary | ICD-10-CM

## 2021-03-23 ENCOUNTER — Encounter: Payer: Self-pay | Admitting: Hematology

## 2021-03-23 ENCOUNTER — Ambulatory Visit: Payer: Medicare Other | Admitting: Hematology

## 2021-03-23 ENCOUNTER — Other Ambulatory Visit: Payer: Self-pay

## 2021-03-23 ENCOUNTER — Telehealth: Payer: Self-pay

## 2021-03-23 ENCOUNTER — Inpatient Hospital Stay (HOSPITAL_BASED_OUTPATIENT_CLINIC_OR_DEPARTMENT_OTHER): Payer: Medicare Other | Admitting: Hematology

## 2021-03-23 ENCOUNTER — Inpatient Hospital Stay: Payer: Medicare Other | Attending: Hematology

## 2021-03-23 ENCOUNTER — Other Ambulatory Visit: Payer: Self-pay | Admitting: Cardiovascular Disease

## 2021-03-23 ENCOUNTER — Other Ambulatory Visit: Payer: Medicare Other

## 2021-03-23 VITALS — BP 153/65 | HR 63 | Temp 98.2°F | Resp 20 | Ht 63.0 in | Wt 176.5 lb

## 2021-03-23 DIAGNOSIS — N6092 Unspecified benign mammary dysplasia of left breast: Secondary | ICD-10-CM | POA: Insufficient documentation

## 2021-03-23 DIAGNOSIS — D0512 Intraductal carcinoma in situ of left breast: Secondary | ICD-10-CM

## 2021-03-23 DIAGNOSIS — Z923 Personal history of irradiation: Secondary | ICD-10-CM | POA: Diagnosis not present

## 2021-03-23 DIAGNOSIS — Z170 Estrogen receptor positive status [ER+]: Secondary | ICD-10-CM | POA: Insufficient documentation

## 2021-03-23 DIAGNOSIS — E785 Hyperlipidemia, unspecified: Secondary | ICD-10-CM | POA: Diagnosis not present

## 2021-03-23 DIAGNOSIS — I10 Essential (primary) hypertension: Secondary | ICD-10-CM | POA: Diagnosis not present

## 2021-03-23 DIAGNOSIS — Z853 Personal history of malignant neoplasm of breast: Secondary | ICD-10-CM | POA: Diagnosis not present

## 2021-03-23 DIAGNOSIS — I5032 Chronic diastolic (congestive) heart failure: Secondary | ICD-10-CM | POA: Diagnosis not present

## 2021-03-23 DIAGNOSIS — Z5181 Encounter for therapeutic drug level monitoring: Secondary | ICD-10-CM | POA: Diagnosis not present

## 2021-03-23 LAB — CBC WITH DIFFERENTIAL/PLATELET
Abs Immature Granulocytes: 0.03 10*3/uL (ref 0.00–0.07)
Basophils Absolute: 0 10*3/uL (ref 0.0–0.1)
Basophils Relative: 0 %
Eosinophils Absolute: 0.1 10*3/uL (ref 0.0–0.5)
Eosinophils Relative: 1 %
HCT: 37.2 % (ref 36.0–46.0)
Hemoglobin: 11.7 g/dL — ABNORMAL LOW (ref 12.0–15.0)
Immature Granulocytes: 0 %
Lymphocytes Relative: 19 %
Lymphs Abs: 1.6 10*3/uL (ref 0.7–4.0)
MCH: 28.3 pg (ref 26.0–34.0)
MCHC: 31.5 g/dL (ref 30.0–36.0)
MCV: 90.1 fL (ref 80.0–100.0)
Monocytes Absolute: 0.9 10*3/uL (ref 0.1–1.0)
Monocytes Relative: 10 %
Neutro Abs: 6.2 10*3/uL (ref 1.7–7.7)
Neutrophils Relative %: 70 %
Platelets: 209 10*3/uL (ref 150–400)
RBC: 4.13 MIL/uL (ref 3.87–5.11)
RDW: 14.7 % (ref 11.5–15.5)
WBC: 8.8 10*3/uL (ref 4.0–10.5)
nRBC: 0 % (ref 0.0–0.2)

## 2021-03-23 LAB — COMPREHENSIVE METABOLIC PANEL
ALT: 19 U/L (ref 0–44)
AST: 20 U/L (ref 15–41)
Albumin: 3.7 g/dL (ref 3.5–5.0)
Alkaline Phosphatase: 68 U/L (ref 38–126)
Anion gap: 8 (ref 5–15)
BUN: 14 mg/dL (ref 8–23)
CO2: 27 mmol/L (ref 22–32)
Calcium: 9.3 mg/dL (ref 8.9–10.3)
Chloride: 109 mmol/L (ref 98–111)
Creatinine, Ser: 0.87 mg/dL (ref 0.44–1.00)
GFR, Estimated: >60 mL/min (ref >60)
Glucose, Bld: 44 mg/dL — CL (ref 70–99)
Potassium: 3.7 mmol/L (ref 3.5–5.1)
Sodium: 144 mmol/L (ref 135–145)
Total Bilirubin: 0.7 mg/dL (ref 0.3–1.2)
Total Protein: 6.6 g/dL (ref 6.5–8.1)

## 2021-03-23 NOTE — Telephone Encounter (Signed)
This nurse attempted to reach patient related to low glucose levels.  Left a message for patient to return call to the clinic.  No further questions or concerns at this time.

## 2021-03-23 NOTE — Progress Notes (Signed)
[place]   [phone] [phone]   Clinic Follow up Note   Patient Care Team: Laurey Morale, MD as PCP - Cyndia Diver, MD as PCP - Cardiology (Cardiology) Reynold Bowen, MD as Consulting Physician (Endocrinology) Ashok Pall, MD as Consulting Physician (Neurosurgery) Earlie Server, MD as Consulting Physician (Orthopedic Surgery) Eli Hose, [facility] (Inactive) as Pharmacist (Pharmacist) Mauro Kaufmann, RN as Oncology Nurse Navigator Rockwell Germany, RN as Oncology Nurse Navigator Sharmon Revere as Physician Assistant (Cardiology)  Date of Service:  03/23/2021  CHIEF COMPLAINT: f/u of left breast DCIS  CURRENT THERAPY:  Surveillance  ASSESSMENT & PLAN:  [name] is a 75 y.o. female with   1. Left Breast DCIS, Intermediate Grade, ER+/PR+ -She was discovered on screening MRI, diagnosed in 12/2019. S/p Left lumpectomy with Dr Brantley Stage on 01/13/20 and Adjuvant Radiation with Dr Isidore Moos 02/11/20-03/04/20.  -Due to her h/o of left [facility] and recent left DCIS, she is at higher risk of developing future breast cancer.  She is agreeable with annual mammograms and MRIs 6 months apart -Given her ER/PR positive disease she would benefit from antiestrogen therapy to reduce her risk of future breast cancer. She understands her risk, she previously tried anastrozole and tamoxifen for [facility],  but tolerated poorly and therefore declined trying again. She will continue surveillance. She will also continue breast MRI's for additional screening.  -most recent mammogram 08/31/20 was benign. MRI breast 11/24/20 was stable.  -She is clinically doing well. Labs reviewed, CBC shows some mild anemia (hgb 11.7). She has some residual lymphedema on physical exam. I advised her to massage the area.   2. H/o Left breast ALH, Genetics Negative  -S/p left breast lumpectomy as of 01/26/14, which showed Mayer.  -She tried chemo prevention with anastrozole  and tamoxifen but could not tolerate due to side effects. She declined for treatment with Raloxifene  -She has strong family history of breast cancer, but her genetic testing was negative.    3. HTN, DM, CAD, history of TIA -She will continue follow-up with her primary care physician and continues medications.  -due to recent elevated BP, her cardiologist switched her from Lasix to chlorthalidone on 03/06/21.   4. Chronic Lower back pain, Right hip pain  -She had 2 back surgeries in the past -In a wheelchair today, ambulates with cane -Now followed by Dr. Sena Hitch at [facility] neurosurgery -She was scheduled for another surgery in 01/2021, but her BP was too high to proceed. She hopes to be rescheduled next month.   PLAN: -Continue cancer surveillance  -I ordered mammogram at [facility] for 08/2021 and MRI for 02/2022 -f/u in 1 year with NP [name] (no lab)    No problem-specific Assessment & Plan notes found for this encounter.   SUMMARY OF ONCOLOGIC HISTORY: Oncology History Overview Note  Cancer Staging No matching staging information was found for the patient.    Atypical lobular hyperplasia of left breast  01/26/2014 Surgery   Left Lumpectomy   Diagnosis Breast, excision, Palpable left - LOBULAR NEOPLASIA (ATYPICAL LOBULAR HYPERPLASIA) SEE COMMENT - MICROCALCIFICATIONS IDENTIFIED. - SURGICAL MARGIN, NEGATIVE FOR ATYPIA OR MALIGNANCY. Microscopic Comment The surgical resection margin(s) of the specimen were inked and microscopically evaluated. Multiple representative sections demonstrate foci of atypical lobular hyperplasia. Additional non-neoplastic findings to include benign radial scar, fibrocystic change, usual ductal hyperplasia, columnar cell hyperplasia/change without atypia, benign adenosis, patchy mild periductal chronic inflammation, and solitary hyalinizing calcified fibroadenomatoid nodule. There are  abundant microcalcifications present within benign lobules.  (CRR:ecj 01/27/2014)   02/12/2014 Initial Diagnosis   Atypical lobular hyperplasia of left breast   11/2014 - 04/2015 Anti-estrogen oral therapy   She tried anastrozole in 11/2014 and Tamoxifen in 03/2015 for about one month each, and stopped due to side effects.    06/27/2017 Pathology Results   Diagnosis 1. Breast, left, needle core biopsy, posterior upper outer quadrant - FIBROCYSTIC CHANGES. - THERE IS NO EVIDENCE OF MALIGNANCY. - SEE COMMENT. 2. Breast, left, needle core biopsy, anterior lower inner quadrant - FIBROCYSTIC CHANGES. - THERE IS NO EVIDENCE OF MALIGNANCY. - SEE COMMENT. Microscopic Comment 1. , 2. The results were called to [facility] on 06/28/2017. (JBK:ecj 06/29/1027)   07/24/2017 Surgery   Left Lumpectomy  Diagnosis Breast, lumpectomy, Left - FIBROCYSTIC CHANGES. - HEALING BIOPSY SITE. - THERE IS NO EVIDENCE OF MALIGNANCY. - SEE COMMENT. Microscopic Comment The surgical resection margin(s) of the specimen were inked and microscopically evaluated.   Ductal carcinoma in situ (DCIS) of left breast  12/16/2019 Initial Biopsy   Diagnosis 1. Breast, right, needle core biopsy, upper inner - FIBROCYSTIC CHANGES. - NO MALIGNANCY IDENTIFIED. 2. Breast, right, needle core biopsy, lateral - FIBROCYSTIC CHANGES WITH FOCAL ADENOSIS. - NO MALIGNANCY IDENTIFIED. 3. Breast, left, needle core biopsy, lateral - DUCTAL CARCINOMA IN SITU, INTERMEDIATE GRADE. - SEE MICROSCOPIC DESCRIPTION Microscopic Comment 3. Immunohistochemistry for p63, calponin and smooth muscle myosin and E-Cadherin supports the diagnosis. Estrogen and progesterone receptors will be performed. Dr. Vic Ripper has reviewed part 3 and agrees.   12/16/2019 Receptors her2   3. PROGNOSTIC INDICATORS Results: IMMUNOHISTOCHEMICAL AND MORPHOMETRIC ANALYSIS PERFORMED MANUALLY Estrogen Receptor: 95%, POSITIVE, STRONG-MODERATE STAINING INTENSITY Progesterone Receptor: >95%, POSITIVE, STRONG  STAINING INTENSITY   01/06/2020 Initial Diagnosis   Ductal carcinoma in situ (DCIS) of left breast   01/13/2020 Surgery   LEFT BREAST LUMPECTOMY WITH RADIOACTIVE SEED LOCALIZATION by Dr Brantley Stage   01/13/2020 Pathology Results   FINAL MICROSCOPIC DIAGNOSIS:   A. BREAST, LEFT, LUMPECTOMY:  - Intermediate grade ductal carcinoma in situ with necrosis, spanning 2  cm.  - Resection margins are negative.  - Biopsy site and cavity.  - See oncology table.    02/11/2020 - 03/04/2020 Radiation Therapy   Adjuvant Radiation with Dr Isidore Moos     Anti-estrogen oral therapy   She declined as she did not tolerate before      INTERVAL HISTORY:  Brendia Dampier is here for a follow up of DCIS. She was last seen by me on 03/23/20 and by NP [name] in the interim. She presents to the clinic alone. She reports she was scheduled to undergo back surgery, but this had to be cancelled due to her elevated blood pressure. She denies any breast concerns.   All other systems were reviewed with the patient and are negative.  MEDICAL HISTORY:  Past Medical History:  Diagnosis Date   Abnormal nuclear stress test 12/29/2014   Allergy    Anemia, unspecified    Arthritis    Blood transfusion without reported diagnosis    2018   CAD (coronary artery disease)    sees. Dr. Burt Knack. s/p cath with PCI of RCA (xience des) June 2010. Pt also with LAD and D2 dzs-NL FFR in both areas med Rx   Carotid artery occlusion    Cataract    sees Dr. Herbert Deaner    Depressive disorder, not elsewhere classified    Duodenal nodule    Edema  Fibromyalgia    Gastric polyps    COLON   GERD (gastroesophageal reflux disease)    Glaucoma    narrow angle, sees Dr. Shelly Flatten)    Hearing loss    left ear   Heart murmur    questionable   Hemorrhoids    HSV (herpes simplex virus) anogenital infection 05/2014   HTN (hypertension)    Hyperlipidemia    Low back pain    she sees Dr. Louanne Skye, also Dr. Ernestina Patches for pain  medications and Benjiman Core PA for injections    Obesity    Osteopenia 01/2018   T score -1.6 FRAX 7.1% / 1.4%   Peripheral vascular disease ([place])    Resistant hypertension 02/21/2007   Qualifier: Diagnosis of  By: Tiney Rouge CMA, Ellison Hughs     Stroke [facility])    TIA  "many yrs ago"     TIA (transient ischemic attack)    also Hx of it.    Type II or unspecified type diabetes mellitus without mention of complication, not stated as uncontrolled    sees Dr. Carrolyn Meiers     SURGICAL HISTORY: Past Surgical History:  Procedure Laterality Date   ABDOMINAL HYSTERECTOMY  age 59   TAH and USO  Leiomyomata   ANTERIOR CERVICAL CORPECTOMY N/A 06/11/2014   Procedure: ANTERIOR CERVICAL CORPECTOMY CERVICAL SIX; WITH CERVICAL FIVE TO CERVICAL SEVEN ARTHRODESIS;  Surgeon: Ashok Pall, MD;  Location: [place] NEURO ORS;  Service: Neurosurgery;  Laterality: N/A;   BACK SURGERY  2008   lumb fusion   BREAST BIOPSY Left 01/26/2014   Procedure: EXCISION LEFT BREAST MASS;  Surgeon: Adin Hector, MD;  Location: [place];  Service: General;  Laterality: Left;   BREAST LUMPECTOMY WITH RADIOACTIVE SEED LOCALIZATION Left 07/24/2017   Procedure: LEFT BREAST LUMPECTOMY WITH RADIOACTIVE SEED LOCALIZATION ERAS;  Surgeon: Erroll Luna, MD;  Location: [place];  Service: General;  Laterality: Left;   BREAST LUMPECTOMY WITH RADIOACTIVE SEED LOCALIZATION Left 01/13/2020   Procedure: LEFT BREAST LUMPECTOMY WITH RADIOACTIVE SEED LOCALIZATION;  Surgeon: Erroll Luna, MD;  Location: [place];  Service: General;  Laterality: Left;   CARDIAC CATHETERIZATION     CARDIAC CATHETERIZATION N/A 12/29/2014   Procedure: Left Heart Cath and Coronary Angiography;  Surgeon: Sherren Mocha, MD;  Location: [place] CV LAB;  Service: Cardiovascular;  Laterality: N/A;   CAROTID ENDARTERECTOMY  march 2012   per Dr. Morton Amy   CARPAL TUNNEL RELEASE Left 05/14/2014   Procedure: Left Carpal  tunnel release;  Surgeon: Ashok Pall, MD;  Location: [place] NEURO ORS;  Service: Neurosurgery;  Laterality: Left;  Left Carpal tunnel release   CARPAL TUNNEL RELEASE Bilateral    CHOLECYSTECTOMY     COLONOSCOPY  06/26/2017   per Dr. Henrene Pastor, adenomatous polyp, repeat in 5 yrs     CORONARY STENT PLACEMENT     Dr. Burt Knack   DILATION AND CURETTAGE OF UTERUS     ESOPHAGOGASTRODUODENOSCOPY  02-08-06   per Dr. Henrene Pastor, gastritis    EYE SURGERY Left June 2016   Cataract   GLAUCOMA SURGERY     with laser, per Dr. Henrene Pastor    HARDWARE REMOVAL  10/30/2016   Procedure: HARDWARE REMOVAL;  Surgeon: Jessy Oto, MD;  Location: [place];  Service: Orthopedics;;   LUMBAR FUSION  2008   POLYPECTOMY     POSTERIOR LUMBAR FUSION  10/30/2016   Removal of hardware rods and pedicle screws lumbar four, Extension of  fusion L4-5 to L5-S1 with reinsertion of screws at L 4, left L5-S1 transforaminal lumbar interbody fusion, Exploration  left L5 nerve root, bilateral decompressive laminectomy L3-4/notes 10/30/2016   ULNAR NERVE TRANSPOSITION Left 05/14/2014   Procedure: Left Ulnar nerve decompression;  Surgeon: Ashok Pall, MD;  Location: [place] NEURO ORS;  Service: Neurosurgery;  Laterality: Left;  Left Ulnar nerve decompression    I have reviewed the social history and family history with the patient and they are unchanged from previous note.  ALLERGIES:  has No Known Allergies.  MEDICATIONS:  Current Outpatient Medications  Medication Sig Dispense Refill   ACCU-CHEK GUIDE test strip 1 each by Other route in the morning and at bedtime.     aspirin EC 81 MG tablet Take 1 tablet (81 mg total) by mouth daily. Swallow whole. 90 tablet 3   cetirizine (ZYRTEC) 10 MG tablet Take 1 tablet (10 mg total) by mouth daily. 30 tablet 11   chlorthalidone (HYGROTON) 25 MG tablet Take 1 tablet (25 mg total) by mouth daily. 90 tablet 3   cyclobenzaprine (FLEXERIL) 10 MG tablet Take 1 tablet (10 mg total) by mouth 3 (three) times daily as needed  for muscle spasms. 90 tablet 2   diclofenac sodium (VOLTAREN) 1 % GEL Apply 2 g topically 4 (four) times daily as needed (for pain).     ergocalciferol (VITAMIN D2) 50000 UNITS capsule Take 50,000 Units by mouth every Saturday. On Saturday.     FARXIGA 10 MG TABS tablet Take 10 mg by mouth every morning.     HUMALOG 100 UNIT/ML injection      hydrALAZINE (APRESOLINE) 100 MG tablet Take 1 tablet (100 mg total) by mouth 3 (three) times daily. 270 tablet 2   HYDROcodone-homatropine (HYCODAN) 5-1.5 MG/5ML syrup Take 5 mLs by mouth every 4 (four) hours as needed for cough. 240 mL 0   ibuprofen (ADVIL) 600 MG tablet Take 1 tablet (600 mg total) by mouth every 6 (six) hours as needed. 30 tablet 0   Insulin Disposable Pump (V-GO 40) KIT Inject 1 application into the skin See admin instructions.     Insulin Human (INSULIN PUMP) SOLN Inject into the skin. Uses Novolog insulin for pump     irbesartan (AVAPRO) 300 MG tablet Take 1 tablet (300 mg total) by mouth daily. 90 tablet 3   isosorbide mononitrate (IMDUR) 60 MG 24 hr tablet TAKE 1 TABLET BY MOUTH ONCE DAILY -  PLEASE  KEEP  UPCOMING  APPOINTMENTIN  JULY  BEFORE  ANYMORE  REFILLS 90 tablet 3   latanoprost (XALATAN) 0.005 % ophthalmic solution Place 1 drop into both eyes at bedtime.      metoprolol succinate (TOPROL-XL) 100 MG 24 hr tablet Take 1 tablet (100 mg total) by mouth daily. 90 tablet 3   montelukast (SINGULAIR) 10 MG tablet Take 10 mg by mouth daily.     nitroGLYCERIN (NITROSTAT) 0.4 MG SL tablet Place 1 tablet (0.4 mg total) under the tongue every 5 (five) minutes as needed for chest pain. 25 tablet 1   nystatin-triamcinolone (MYCOLOG II) cream Apply 1 application topically 2 (two) times daily. 30 g 2   OZEMPIC, 1 MG/DOSE, 4 MG/3ML SOPN Inject 1 mg into the skin once a week.     pantoprazole (PROTONIX) 40 MG tablet Take 1 tablet by mouth once daily 90 tablet 1   rosuvastatin (CRESTOR) 40 MG tablet Take 1 tablet by mouth once daily 90 tablet 3    spironolactone (ALDACTONE) 50 MG tablet  Take 1 tablet by mouth once daily 90 tablet 2   traMADol (ULTRAM) 50 MG tablet 50 mg continuously as needed     No current facility-administered medications for this visit.    PHYSICAL EXAMINATION: ECOG PERFORMANCE STATUS: 3 - Symptomatic, >50% confined to bed  Vitals:   03/23/21 1013  BP: (!) 153/65  Pulse: 63  Resp: 20  Temp: 98.2 F (36.8 C)  SpO2: 100%   Wt Readings from Last 3 Encounters:  03/23/21 176 lb 8 oz (80.1 kg)  03/06/21 178 lb 9.6 oz (81 kg)  01/31/21 174 lb (78.9 kg)     GENERAL:alert, no distress and comfortable SKIN: skin color, texture, turgor are normal, no rashes or significant lesions EYES: normal, Conjunctiva are pink and non-injected, sclera clear  NECK: supple, thyroid normal size, non-tender, without nodularity LYMPH:  no palpable lymphadenopathy in the cervical, axillary  LUNGS: clear to auscultation and percussion with normal breathing effort HEART: regular rate & rhythm and no murmurs and no lower extremity edema ABDOMEN:abdomen soft, non-tender and normal bowel sounds Musculoskeletal:no cyanosis of digits and no clubbing  NEURO: alert & oriented x 3 with fluent speech, no focal motor/sensory deficits BREAST: mild lymphedema. No palpable mass, nodules or adenopathy bilaterally. Breast exam benign.   LABORATORY DATA:  I have reviewed the data as listed CBC Latest Ref Rng & Units 03/23/2021 03/23/2020 03/09/2020  WBC 4.0 - 10.5 K/uL 8.8 8.0 9.2  Hemoglobin 12.0 - 15.0 g/dL 11.7(L) 13.1 13.1  Hematocrit 36.0 - 46.0 % 37.2 41.5 40.0  Platelets 150 - 400 K/uL 209 177 212     CMP Latest Ref Rng & Units 03/23/2021 03/23/2020 03/09/2020  Glucose 70 - 99 mg/dL 44(LL) 87 84  BUN 8 - 23 mg/dL 14 16 17   Creatinine 0.44 - 1.00 mg/dL 0.87 1.08(H) 0.92  Sodium 135 - 145 mmol/L 144 142 142  Potassium 3.5 - 5.1 mmol/L 3.7 4.3 3.7  Chloride 98 - 111 mmol/L 109 108 105  CO2 22 - 32 mmol/L 27 29 29   Calcium 8.9 - 10.3  mg/dL 9.3 9.8 9.8  Total Protein 6.5 - 8.1 g/dL 6.6 6.9 6.5  Total Bilirubin 0.3 - 1.2 mg/dL 0.7 0.6 0.5  Alkaline Phos 38 - 126 U/L 68 69 -  AST 15 - 41 U/L 20 22 16   ALT 0 - 44 U/L 19 21 13       RADIOGRAPHIC STUDIES: I have personally reviewed the radiological images as listed and agreed with the findings in the report. No results found.    Orders Placed This Encounter  Procedures   MM DIAG BREAST TOMO BILATERAL    Standing Status:   Future    Standing Expiration Date:   03/23/2022    Scheduling Instructions:     [place]    Order Specific Question:   Reason for Exam (SYMPTOM  OR DIAGNOSIS REQUIRED)    Answer:   screening    Order Specific Question:   Preferred imaging location?    Answer:   External   MR BREAST BILATERAL W WO CONTRAST INC CAD    Standing Status:   Future    Standing Expiration Date:   03/23/2022    Order Specific Question:   If indicated for the ordered procedure, I authorize the administration of contrast media per Radiology protocol    Answer:   Yes    Order Specific Question:   What is the patient's sedation requirement?    Answer:   No Sedation  Order Specific Question:   Does the patient have a pacemaker or implanted devices?    Answer:   No    Order Specific Question:   Radiology Contrast Protocol - do NOT remove file path    Answer:   [email or web address]    Order Specific Question:   Preferred imaging location?    Answer:   [facility] (table limit-550lbs)   All questions were answered. The patient knows to call the clinic with any problems, questions or concerns. No barriers to learning was detected. The total time spent in the appointment was 25 minutes.     Truitt Merle, MD 03/23/2021   I, Wilburn Mylar, am acting as scribe for Truitt Merle, MD.   I have reviewed the above documentation for accuracy and completeness, and I agree with the above.

## 2021-03-23 NOTE — Telephone Encounter (Signed)
CRITICAL VALUE STICKER  CRITICAL VALUE: GLUCOSE    73  RECEIVER (on-site recipient of call): Magnolia Mattila P. LPN  DATE & TIME NOTIFIED: 03/23/21 10:44 am  MESSENGER (representative from lab): Ulice Dash  MD NOTIFIED: Dr. Burr Medico  TIME OF NOTIFICATION: 10:44 am

## 2021-03-24 ENCOUNTER — Telehealth: Payer: Self-pay | Admitting: Cardiovascular Disease

## 2021-03-24 LAB — COMPREHENSIVE METABOLIC PANEL
ALT: 18 IU/L (ref 0–32)
AST: 23 IU/L (ref 0–40)
Albumin/Globulin Ratio: 2 (ref 1.2–2.2)
Albumin: 4.5 g/dL (ref 3.7–4.7)
Alkaline Phosphatase: 79 IU/L (ref 44–121)
BUN/Creatinine Ratio: 15 (ref 12–28)
BUN: 14 mg/dL (ref 8–27)
Bilirubin Total: 0.6 mg/dL (ref 0.0–1.2)
CO2: 26 mmol/L (ref 20–29)
Calcium: 9.6 mg/dL (ref 8.7–10.3)
Chloride: 105 mmol/L (ref 96–106)
Creatinine, Ser: 0.92 mg/dL (ref 0.57–1.00)
Globulin, Total: 2.3 g/dL (ref 1.5–4.5)
Glucose: 37 mg/dL — CL (ref 65–99)
Potassium: 3.9 mmol/L (ref 3.5–5.2)
Sodium: 146 mmol/L — ABNORMAL HIGH (ref 134–144)
Total Protein: 6.8 g/dL (ref 6.0–8.5)
eGFR: 66 mL/min/{1.73_m2} (ref >59)

## 2021-03-24 LAB — BASIC METABOLIC PANEL
BUN/Creatinine Ratio: 15 (ref 12–28)
BUN: 14 mg/dL (ref 8–27)
CO2: 26 mmol/L (ref 20–29)
Calcium: 10 mg/dL (ref 8.7–10.3)
Chloride: 106 mmol/L (ref 96–106)
Creatinine, Ser: 0.91 mg/dL (ref 0.57–1.00)
Glucose: 37 mg/dL — CL (ref 65–99)
Potassium: 3.9 mmol/L (ref 3.5–5.2)
Sodium: 149 mmol/L — ABNORMAL HIGH (ref 134–144)
eGFR: 67 mL/min/{1.73_m2} (ref >59)

## 2021-03-24 LAB — LIPID PANEL
Chol/HDL Ratio: 2.3 ratio (ref 0.0–4.4)
Cholesterol, Total: 113 mg/dL (ref 100–199)
HDL: 49 mg/dL (ref >39)
LDL Chol Calc (NIH): 50 mg/dL (ref 0–99)
Triglycerides: 63 mg/dL (ref 0–149)
VLDL Cholesterol Cal: 14 mg/dL (ref 5–40)

## 2021-03-24 NOTE — Telephone Encounter (Signed)
Received a call from [employer] calling to report 03/23/21 glucose 37. Spoke to patient she stated her blood sugar has never been that low.She monitors blood sugar daily. Blood sugar at present 172.Advised I will send lab results to her endocrinologist Dr.Stephen [place].

## 2021-03-24 NOTE — Telephone Encounter (Signed)
[name] is calling to report critical glucose lab results.  Phone #: [phone] (opt:1)

## 2021-04-03 ENCOUNTER — Telehealth: Payer: Self-pay

## 2021-04-03 NOTE — Telephone Encounter (Signed)
Left message asking patient to call office at [phone] to schedule AWV. Nat Christen, CMA

## 2021-04-07 ENCOUNTER — Other Ambulatory Visit: Payer: Self-pay | Admitting: Physician Assistant

## 2021-04-12 DIAGNOSIS — H402233 Chronic angle-closure glaucoma, bilateral, severe stage: Secondary | ICD-10-CM | POA: Diagnosis not present

## 2021-04-12 DIAGNOSIS — H04123 Dry eye syndrome of bilateral lacrimal glands: Secondary | ICD-10-CM | POA: Diagnosis not present

## 2021-04-12 DIAGNOSIS — H35033 Hypertensive retinopathy, bilateral: Secondary | ICD-10-CM | POA: Diagnosis not present

## 2021-04-12 DIAGNOSIS — E119 Type 2 diabetes mellitus without complications: Secondary | ICD-10-CM | POA: Diagnosis not present

## 2021-04-12 LAB — HM DIABETES EYE EXAM

## 2021-04-19 ENCOUNTER — Telehealth: Payer: Self-pay | Admitting: Family Medicine

## 2021-04-19 NOTE — Telephone Encounter (Signed)
Last VV- 01/25/2021

## 2021-04-19 NOTE — Telephone Encounter (Signed)
Pt is calling and will be having back surgery at [facility] on 05-02-2021 and would like to know if she can get flu shot and prevnar 20 same day before surgery . Pt also would like to get shingle vaccine. Please advise

## 2021-04-20 NOTE — Telephone Encounter (Signed)
Yes she can get the pneumonia shot and the flu shot together prior to the surgery. I suggest she wait on the shingles shots until after the surgery

## 2021-04-20 NOTE — Telephone Encounter (Signed)
Spoke with patient message given, patient to call and schedule nurse visit appointment.

## 2021-04-21 ENCOUNTER — Ambulatory Visit: Payer: Medicare Other

## 2021-04-21 NOTE — Progress Notes (Signed)
Cardiology Office Note:   Date:  04/24/2021   ID:  [name], DOB 1945-11-04, MRN [id number]  PCP:  Laurey Morale, MD  Cardiologist:  Sherren Mocha, MD  Nephrologist:  Referring MD: Laurey Morale, MD   CC: Hypertension  History of Present Illness:    [name] is a 75 y.o. female with a hx of CAD s/p RCA PCI, mild carotid stenosis, stroke, hypertension, hyperlipidemia, GERD, diabetes, here for follow-up. She initially established care in the hypertension clinic 05/07/2019.  She had an RCA PCI in 2010.  Repeat heart catheterization in 2016 showed that her stent was patent.  There was 50% LAD stenosis and 60% D1. She reports having high blood pressure for 20-30 years. It has been intermittently controlled.  She did cardiac rehab after her PCI and noted that it was better controlled when in cardiac rehab. It was also better controlled when she was exercising more. Her exercise was limited after having a back surgery. She had renal artery Dopplers that were normal bilaterally in 2013.  Her blood pressure has always been labile.  She does not check it regularly at home because she does not have a machine.  She saw Richardson Dopp, PA-C on 04/28/19. Her BP was poorly controlled. Spironolactone was increased. Doxazosin was added to her regimen. She stopped it due to swelling. She followed up with our pharmacist and was noted to have some problems with compliance. They also split up her medication doses. She saw Richardson Dopp, PA-C 09/2020 and her blood pressure was well-controlled. It was also controlled when she saw Laurann Montana, NP 01/2021.  At her last appointment, her back surgery was postponed until 9/26 due to elevated blood pressure as high as 546 systolic. It was noted that she had been told to hold 2 of her antihypertensives pre-operatively. Her blood pressure was otherwise pretty much well-controlled. And lasix was switched to chlorthalidone at her last visit.  Today, she  is feeling pretty good overall, aside from her chronic back pain that radiates to her right LE. She presents a blood pressure log today, showing a range of 110-150s/50-60s, averaging in the 568L systolic. She denies any palpitations, chest pain, or shortness of breath. No lightheadedness, headaches, syncope, orthopnea, or PND. Currently she is scheduled for a lumbar surgeruy on 05/02/2021, to be performed by Dr. Dava Najjar ([facility]).   Current BP Regimen: Hydralazine 100 mg tid laisx 67m daily Irbesartan 300 mg daily Imdur 95mdaily Metoprolol succinate 10030maily Spironolactone 60m47mily  Previous antihypertensives: Amlodipine Carvedilol- swelling Clonidine- doesn't remember the side effect Doxazosin - Swelling  Past Medical History:  Diagnosis Date   Abnormal nuclear stress test 12/29/2014   Allergy    Anemia, unspecified    Arthritis    Blood transfusion without reported diagnosis    2018   CAD (coronary artery disease)    sees. Dr. CoopBurt Knackp cath with PCI of RCA (xience des) June 2010. Pt also with LAD and D2 dzs-NL FFR in both areas med Rx   Carotid artery occlusion    Cataract    sees Dr. HeckHerbert DeanerDepressive disorder, not elsewhere classified    Duodenal nodule    Edema    Fibromyalgia    Gastric polyps    COLON   GERD (gastroesophageal reflux disease)    Glaucoma    narrow angle, sees Dr. HeckShelly Flatten Hearing loss    left ear   Heart  murmur    questionable   Hemorrhoids    HSV (herpes simplex virus) anogenital infection 05/2014   HTN (hypertension)    Hyperlipidemia    Low back pain    she sees Dr. Louanne Skye, also Dr. Ernestina Patches for pain medications and Benjiman Core PA for injections    Obesity    Osteopenia 01/2018   T score -1.6 FRAX 7.1% / 1.4%   Peripheral vascular disease ([place])    Resistant hypertension 02/21/2007   Qualifier: Diagnosis of  By: Tiney Rouge CMA, Ellison Hughs     Stroke [facility])    TIA  "many yrs ago"     TIA (transient ischemic  attack)    also Hx of it.    Type II or unspecified type diabetes mellitus without mention of complication, not stated as uncontrolled    sees Dr. Carrolyn Meiers     Past Surgical History:  Procedure Laterality Date   ABDOMINAL HYSTERECTOMY  age 21   TAH and USO  Leiomyomata   ANTERIOR CERVICAL CORPECTOMY N/A 06/11/2014   Procedure: ANTERIOR CERVICAL CORPECTOMY CERVICAL SIX; WITH CERVICAL FIVE TO CERVICAL SEVEN ARTHRODESIS;  Surgeon: Ashok Pall, MD;  Location: [place] [facility];  Service: Neurosurgery;  Laterality: N/A;   BACK SURGERY  2008   lumb fusion   BREAST BIOPSY Left 01/26/2014   Procedure: EXCISION LEFT BREAST MASS;  Surgeon: Adin Hector, MD;  Location: [place];  Service: General;  Laterality: Left;   BREAST LUMPECTOMY WITH RADIOACTIVE SEED LOCALIZATION Left 07/24/2017   Procedure: LEFT BREAST LUMPECTOMY WITH RADIOACTIVE SEED LOCALIZATION ERAS;  Surgeon: Erroll Luna, MD;  Location: [place];  Service: General;  Laterality: Left;   BREAST LUMPECTOMY WITH RADIOACTIVE SEED LOCALIZATION Left 01/13/2020   Procedure: LEFT BREAST LUMPECTOMY WITH RADIOACTIVE SEED LOCALIZATION;  Surgeon: Erroll Luna, MD;  Location: [place];  Service: General;  Laterality: Left;   CARDIAC CATHETERIZATION     CARDIAC CATHETERIZATION N/A 12/29/2014   Procedure: Left Heart Cath and Coronary Angiography;  Surgeon: Sherren Mocha, MD;  Location: [facility];  Service: Cardiovascular;  Laterality: N/A;   CAROTID ENDARTERECTOMY  march 2012   per Dr. Morton Amy   CARPAL TUNNEL RELEASE Left 05/14/2014   Procedure: Left Carpal tunnel release;  Surgeon: Ashok Pall, MD;  Location: [place] [facility];  Service: Neurosurgery;  Laterality: Left;  Left Carpal tunnel release   CARPAL TUNNEL RELEASE Bilateral    CHOLECYSTECTOMY     COLONOSCOPY  06/26/2017   per Dr. Henrene Pastor, adenomatous polyp, repeat in 5 yrs     CORONARY STENT PLACEMENT     Dr. Burt Knack    DILATION AND CURETTAGE OF UTERUS     ESOPHAGOGASTRODUODENOSCOPY  02-08-06   per Dr. Henrene Pastor, gastritis    EYE SURGERY Left June 2016   Cataract   GLAUCOMA SURGERY     with laser, per Dr. Henrene Pastor    HARDWARE REMOVAL  10/30/2016   Procedure: HARDWARE REMOVAL;  Surgeon: Jessy Oto, MD;  Location: [facility];  Service: Orthopedics;;   LUMBAR FUSION  2008   POLYPECTOMY     POSTERIOR LUMBAR FUSION  10/30/2016   Removal of hardware rods and pedicle screws lumbar four, Extension of fusion L4-5 to L5-S1 with reinsertion of screws at L 4, left L5-S1 transforaminal lumbar interbody fusion, Exploration  left L5 nerve root, bilateral decompressive laminectomy L3-4/notes 10/30/2016   ULNAR NERVE TRANSPOSITION Left 05/14/2014   Procedure: Left Ulnar nerve decompression;  Surgeon: Ashok Pall, MD;  Location: [place] [facility];  Service: Neurosurgery;  Laterality: Left;  Left Ulnar nerve decompression    Current Medications: Current Meds  Medication Sig   ACCU-CHEK GUIDE test strip 1 each by Other route in the morning and at bedtime.   aspirin EC 81 MG tablet Take 1 tablet (81 mg total) by mouth daily. Swallow whole.   cetirizine (ZYRTEC) 10 MG tablet Take 1 tablet (10 mg total) by mouth daily.   chlorthalidone (HYGROTON) 25 MG tablet Take 1 tablet (25 mg total) by mouth daily.   cyclobenzaprine (FLEXERIL) 10 MG tablet Take 1 tablet (10 mg total) by mouth 3 (three) times daily as needed for muscle spasms.   ergocalciferol (VITAMIN D2) 50000 UNITS capsule Take 50,000 Units by mouth every Saturday. On Saturday.   FARXIGA 10 MG TABS tablet Take 10 mg by mouth every morning.   HUMALOG 100 UNIT/ML injection    hydrALAZINE (APRESOLINE) 100 MG tablet Take 1 tablet (100 mg total) by mouth 3 (three) times daily.   HYDROcodone-homatropine (HYCODAN) 5-1.5 MG/5ML syrup Take 5 mLs by mouth every 4 (four) hours as needed for cough.   ibuprofen (ADVIL) 600 MG tablet Take 1 tablet (600 mg total) by mouth every 6 (six) hours as  needed.   Insulin Disposable Pump (V-GO 40) KIT Inject 1 application into the skin See admin instructions.   Insulin Human (INSULIN PUMP) SOLN Inject into the skin. Uses Novolog insulin for pump   irbesartan (AVAPRO) 300 MG tablet Take 1 tablet (300 mg total) by mouth daily.   isosorbide mononitrate (IMDUR) 60 MG 24 hr tablet TAKE 1 TABLET BY MOUTH ONCE DAILY -  PLEASE  KEEP  UPCOMING  APPOINTMENTIN  JULY  BEFORE  ANYMORE  REFILLS   latanoprost (XALATAN) 0.005 % ophthalmic solution Place 1 drop into both eyes at bedtime.    metoprolol succinate (TOPROL-XL) 100 MG 24 hr tablet Take 1 tablet (100 mg total) by mouth daily.   montelukast (SINGULAIR) 10 MG tablet Take 10 mg by mouth daily.   nitroGLYCERIN (NITROSTAT) 0.4 MG SL tablet Place 1 tablet (0.4 mg total) under the tongue every 5 (five) minutes as needed for chest pain.   nystatin-triamcinolone (MYCOLOG II) cream Apply 1 application topically 2 (two) times daily.   OZEMPIC, 1 MG/DOSE, 4 MG/3ML SOPN Inject 1 mg into the skin once a week.   pantoprazole (PROTONIX) 40 MG tablet Take 1 tablet by mouth once daily   rosuvastatin (CRESTOR) 40 MG tablet Take 1 tablet by mouth once daily   spironolactone (ALDACTONE) 50 MG tablet Take 1 tablet by mouth once daily   traMADol (ULTRAM) 50 MG tablet 50 mg continuously as needed     Allergies:   Patient has no known allergies.   Social History   Socioeconomic History   Marital status: Married    Spouse name: Not on file   Number of children: 1   Years of education: 16   Highest education level: Bachelor's degree (e.g., BA, AB, BS)  Occupational History   Not on file  Tobacco Use   Smoking status: Former    Types: Cigarettes    Quit date: 01/22/1967    Years since quitting: 54.2   Smokeless tobacco: Never  Vaping Use   Vaping Use: Never used  Substance and Sexual Activity   Alcohol use: No    Alcohol/week: 0.0 standard drinks   Drug use: No   Sexual activity: Not Currently    Birth  control/protection: Surgical, Post-menopausal  Comment: HYST-1st intercourse 48 yo-5 partners  Other Topics Concern   Not on file  Social History Narrative   Not on file   Social Determinants of Health   Financial Resource Strain: Not on file  Food Insecurity: Not on file  Transportation Needs: Not on file  Physical Activity: Not on file  Stress: Not on file  Social Connections: Not on file     Family History: [name] family history includes Breast cancer (age of onset: 70) in her cousin; Breast cancer (age of onset: 83) in her maternal aunt; Breast cancer (age of onset: 37) in her cousin; Breast cancer (age of onset: 89) in her cousin; Cancer in her brother, father, and maternal aunt; Diabetes in her brother; Esophageal cancer in her cousin; Heart disease in her brother and maternal grandfather; Hyperlipidemia in her sister; Hypertension in her brother, mother, and sister; Prostate cancer (age of onset: 89) in her cousin; Stomach cancer (age of onset: 55) in her maternal grandmother; Varicose Veins in her brother. There is no history of Coronary artery disease, Colon cancer, Colon polyps, or Rectal cancer.  ROS:   Please see the history of present illness.    (+) chronic back pain, radiating to right LE All other systems reviewed and are negative.  EKGs/Labs/Other Studies Reviewed:    EKG:   04/24/2021: Sinus rhythm. Rate 70 bpm. 03/06/2021: EKG is not ordered today. 05/07/2019: EKG is not ordered.   04/28/19: sinus bradycardia.  Rate 59 bpm.   Carotid Arterial Duplex 03/21/2021: Summary:  Right Carotid: Velocities in the right ICA are consistent with a 1-39%  stenosis.   Left Carotid: Velocities in the left ICA are consistent with a 1-39%  stenosis, upper end of range.   Vertebrals:  Bilateral vertebral arteries demonstrate antegrade flow.  Subclavians: Normal flow hemodynamics were seen in bilateral subclavian arteries.   Echo 12/30/14 EF 60% to 65%. Wall motion  was normal; Grade 2 diastolic dysfunction   LHC 12/29/14 LM:  Ostial 30 LAD: prox 50, dist 50; D1 60 LCx:  lum irregs RCA:  prox 30, mid stent ok Med Rx      Recent Labs: 03/23/2021: ALT 18; BUN 14; Creatinine, Ser 0.92; Hemoglobin 11.7; Platelets 209; Potassium 3.9; Sodium 146   Recent Lipid Panel    Component Value Date/Time   CHOL 113 03/23/2021 1141   TRIG 63 03/23/2021 1141   HDL 49 03/23/2021 1141   CHOLHDL 2.3 03/23/2021 1141   CHOLHDL 2.7 03/09/2020 1043   VLDL 33.6 02/20/2019 1022   LDLCALC 50 03/23/2021 1141   LDLCALC 50 03/09/2020 1043    Physical Exam:    VS:  BP 122/60 (BP Location: Left Arm, Patient Position: Sitting, Cuff Size: Large)   Pulse 70   Ht 5' 3" (1.6 m)   Wt 177 lb 4.8 oz (80.4 kg)   BMI 31.41 kg/m     Wt Readings from Last 3 Encounters:  04/24/21 177 lb 4.8 oz (80.4 kg)  03/23/21 176 lb 8 oz (80.1 kg)  03/06/21 178 lb 9.6 oz (81 kg)     VS:  BP 122/60 (BP Location: Left Arm, Patient Position: Sitting, Cuff Size: Large)   Pulse 70   Ht 5' 3" (1.6 m)   Wt 177 lb 4.8 oz (80.4 kg)   BMI 31.41 kg/m  , BMI Body mass index is 31.41 kg/m. GENERAL:  Well appearing HEENT: Pupils equal round and reactive, fundi not visualized, oral mucosa unremarkable NECK:  No jugular venous distention,  waveform within normal limits, carotid upstroke brisk and symmetric, +R carotid bruits, no thyromegaly LYMPHATICS:  No cervical adenopathy LUNGS:  Clear to auscultation bilaterally HEART:  RRR.  PMI not displaced or sustained,S1 and S2 within normal limits, no S3, no S4, no clicks, no rubs, no murmurs ABD:  Flat, positive bowel sounds normal in frequency in pitch, no bruits, no rebound, no guarding, no midline pulsatile mass, no hepatomegaly, no splenomegaly EXT:  2 plus pulses throughout, no edema, no cyanosis no clubbing SKIN:  No rashes no nodules NEURO:  Cranial nerves II through XII grossly intact, motor grossly intact throughout PSYCH:  Cognitively  intact, oriented to person place and time   ASSESSMENT:    1. Coronary artery disease involving native coronary artery of native heart without angina pectoris   2. Spinal stenosis of lumbar region with neurogenic claudication   3. Resistant hypertension      PLAN:   CAD (coronary artery disease) S/p RCA PCI. She is stable and has no angina.  Continue aspirin, Imdur, metoprolol, and rosuvastatin.  Okay to hold aspirin preoperatively at the surgeon's discretion and resume postoperatively as soon as possible.  Spinal stenosis of lumbar region with neurogenic claudication She was seen today for preoperative risk assessment.  She is doing well and has no unstable cardiac symptoms.  She is at low risk for surgery.  Recommend that she take all of her antihypertensives on the day of surgery in order to avoid hypertension and cancellation of her procedure.  Hyperlipidemia Lipids are well have been controlled on rosuvastatin.  Resistant hypertension Ms. Glorioso has resistant hypertension.  Her blood pressures at home have not been perfect but overall much better than in the past.  Her blood pressure today was very well-controlled.  Continue chlorthalidone, hydralazine, irbesartan, Imdur, metoprolol, and spironolactone.  Recommend not holding any of her antihypertensives preoperatively.  Her renal function is stable and should be okay for surgery.    Disposition:    F/u with Tiffany C. Oval Linsey, MD, [facility]  6 months.    Medication Adjustments/Labs and Tests Ordered: Current medicines are reviewed at length with the patient today.  Concerns regarding medicines are outlined above.  Orders Placed This Encounter  Procedures   EKG 12-Lead     No orders of the defined types were placed in this encounter.   I,Mathew Stumpf,acting as a Education administrator for Skeet Latch, MD.,have documented all relevant documentation on the behalf of Skeet Latch, MD,as directed by  Skeet Latch, MD while in  the presence of Skeet Latch, MD.  I, [place] Oval Linsey, MD have reviewed all documentation for this visit.  The documentation of the exam, diagnosis, procedures, and orders on 04/24/2021 are all accurate and complete.   Signed, Skeet Latch, MD  04/24/2021 11:17 AM    [place]

## 2021-04-24 ENCOUNTER — Encounter (HOSPITAL_BASED_OUTPATIENT_CLINIC_OR_DEPARTMENT_OTHER): Payer: Self-pay | Admitting: Cardiovascular Disease

## 2021-04-24 ENCOUNTER — Encounter (HOSPITAL_BASED_OUTPATIENT_CLINIC_OR_DEPARTMENT_OTHER): Payer: Self-pay | Admitting: *Deleted

## 2021-04-24 ENCOUNTER — Other Ambulatory Visit: Payer: Self-pay

## 2021-04-24 ENCOUNTER — Ambulatory Visit (INDEPENDENT_AMBULATORY_CARE_PROVIDER_SITE_OTHER): Payer: Medicare Other | Admitting: Cardiovascular Disease

## 2021-04-24 DIAGNOSIS — I10 Essential (primary) hypertension: Secondary | ICD-10-CM

## 2021-04-24 DIAGNOSIS — M48062 Spinal stenosis, lumbar region with neurogenic claudication: Secondary | ICD-10-CM

## 2021-04-24 DIAGNOSIS — I2510 Atherosclerotic heart disease of native coronary artery without angina pectoris: Secondary | ICD-10-CM | POA: Diagnosis not present

## 2021-04-24 NOTE — Assessment & Plan Note (Signed)
Lipids are well have been controlled on rosuvastatin.

## 2021-04-24 NOTE — Assessment & Plan Note (Signed)
[name] has resistant hypertension.  Her blood pressures at home have not been perfect but overall much better than in the past.  Her blood pressure today was very well-controlled.  Continue chlorthalidone, hydralazine, irbesartan, Imdur, metoprolol, and spironolactone.  Recommend not holding any of her antihypertensives preoperatively.  Her renal function is stable and should be okay for surgery.

## 2021-04-24 NOTE — Patient Instructions (Signed)
Medication Instructions:  Your physician recommends that you continue on your current medications as directed. Please refer to the Current Medication list given to you today.   *If you need a refill on your cardiac medications before your next appointment, please call your pharmacy*  Lab Work: NONE   Testing/Procedures: NONE  Follow-Up: At [facility], you and your health needs are our priority.  As part of our continuing mission to provide you with exceptional heart care, we have created designated Provider Care Teams.  These Care Teams include your primary Cardiologist (physician) and Advanced Practice Providers (APPs -  Physician Assistants and Nurse Practitioners) who all work together to provide you with the care you need, when you need it.  We recommend signing up for the patient portal called "MyChart".  Sign up information is provided on this After Visit Summary.  MyChart is used to connect with patients for Virtual Visits (Telemedicine).  Patients are able to view lab/test results, encounter notes, upcoming appointments, etc.  Non-urgent messages can be sent to your provider as well.   To learn more about what you can do with MyChart, go to [email or web address].    Your next appointment:   6 month(s)  The format for your next appointment:   In Person  Provider:   Skeet Latch, MD or Laurann Montana, NP  YOU ARE [facility]

## 2021-04-24 NOTE — Assessment & Plan Note (Signed)
She was seen today for preoperative risk assessment.  She is doing well and has no unstable cardiac symptoms.  She is at low risk for surgery.  Recommend that she take all of her antihypertensives on the day of surgery in order to avoid hypertension and cancellation of her procedure.

## 2021-04-24 NOTE — Assessment & Plan Note (Addendum)
S/p RCA PCI. She is stable and has no angina.  Continue aspirin, Imdur, metoprolol, and rosuvastatin.  Okay to hold aspirin preoperatively at the surgeon's discretion and resume postoperatively as soon as possible.

## 2021-04-25 ENCOUNTER — Telehealth (INDEPENDENT_AMBULATORY_CARE_PROVIDER_SITE_OTHER): Payer: Medicare Other | Admitting: Family Medicine

## 2021-04-25 ENCOUNTER — Encounter: Payer: Self-pay | Admitting: Family Medicine

## 2021-04-25 VITALS — BP 128/70

## 2021-04-25 DIAGNOSIS — R059 Cough, unspecified: Secondary | ICD-10-CM

## 2021-04-25 DIAGNOSIS — R0981 Nasal congestion: Secondary | ICD-10-CM | POA: Diagnosis not present

## 2021-04-25 MED ORDER — BENZONATATE 100 MG PO CAPS: 100.0000 mg | ORAL_CAPSULE | Freq: Three times a day (TID) | ORAL | 0 refills | Status: DC | PRN

## 2021-04-25 NOTE — Patient Instructions (Signed)
  HOME CARE TIPS:  -COVID19 testing information: ForwardDrop.tn  Most pharmacies also offer testing and home test kits. If the Covid19 test is positive and you desire antiviral treatment, please contact a [facility] or schedule a follow up virtual visit through your primary care office or through the [employer].  Other test to treat options: ConnectRV.is?click_source=alert  -I sent the medication(s) we discussed to your pharmacy: Meds ordered this encounter  Medications   benzonatate (TESSALON PERLES) 100 MG capsule    Sig: Take 1 capsule (100 mg total) by mouth 3 (three) times daily as needed.    Dispense:  20 capsule    Refill:  0      -can use tylenol if needed for fevers, aches and pains per instructions  -can use nasal saline a few times per day if you have nasal congestion  -stay hydrated, drink plenty of fluids and eat small healthy meals - avoid dairy  -If the Covid test is positive, check out the [facility] website for more information on home care, transmission and treatment for COVID19  -follow up with your doctor in 2-3 days unless improving and feeling better  -stay home while sick, except to seek medical care. If you have COVID19, ideally it would be best to stay home for a full 10 days since the onset of symptoms PLUS one day of no fever and feeling better. Wear a good mask that fits snugly (such as N95 or KN95) if around others to reduce the risk of transmission.  It was nice to meet you today, and I really hope you are feeling better soon. I help [facility] out with telemedicine visits on Tuesdays and Thursdays and am available for visits on those days. If you have any concerns or questions following this visit please schedule a follow up visit with your Primary Care doctor or seek care at a local urgent care clinic to avoid delays in care.    Seek in person care or schedule a follow up video visit promptly  if your symptoms worsen, new concerns arise or you are not improving with treatment. Call 911 and/or seek emergency care if your symptoms are severe or life threatening.

## 2021-04-25 NOTE — Progress Notes (Signed)
Virtual Visit via Telephone Note  I connected with [name] on 04/25/21 at 12:00 PM EDT by telephone and verified that I am speaking with the correct person using two identifiers.   I discussed the limitations, risks, security and privacy concerns of performing an evaluation and management service by telephone and the availability of in person appointments. I also discussed with the patient that there may be a patient responsible charge related to this service. The patient expressed understanding and agreed to proceed.  Location patient: home, [place] Location provider: work or home office Participants present for the call: patient, provider Patient did not have a visit with me in the prior 7 days to address this/these issue(s).   History of Present Illness:  Acute telemedicine visit for nasal congestion and cough: -Onset: about 2 days ago -Symptoms include: nasal congestion, stuffy nose, dry cough -Denies: fever, body aches, SOB, CP, NVD, inability to eat/drink/get out of bed, known sick contact -Has tried: zyrtec -Pertinent past medical history: see below -Pertinent medication allergies: No Known Allergies -COVID-19 vaccine status: has not had flu shot Immunization History  Administered Date(s) Administered   Influenza Split 03/26/2012, 04/21/2013   Influenza, High Dose Seasonal PF 03/26/2017   Influenza-Unspecified 04/08/2014, 04/08/2016, 04/08/2020   PFIZER(Purple Top)SARS-COV-2 Vaccination 08/29/2019, 09/19/2019, 04/15/2020   Pneumococcal Conjugate-13 03/26/2017   Pneumococcal Polysaccharide-23 03/09/2020    Past Medical History:  Diagnosis Date   Abnormal nuclear stress test 12/29/2014   Allergy    Anemia, unspecified    Arthritis    Blood transfusion without reported diagnosis    2018   CAD (coronary artery disease)    sees. Dr. Burt Knack. s/p cath with PCI of RCA (xience des) June 2010. Pt also with LAD and D2 dzs-NL FFR in both areas med Rx   Carotid artery  occlusion    Cataract    sees Dr. Herbert Deaner    Depressive disorder, not elsewhere classified    Duodenal nodule    Edema    Fibromyalgia    Gastric polyps    COLON   GERD (gastroesophageal reflux disease)    Glaucoma    narrow angle, sees Dr. Shelly Flatten)    Hearing loss    left ear   Heart murmur    questionable   Hemorrhoids    HSV (herpes simplex virus) anogenital infection 05/2014   HTN (hypertension)    Hyperlipidemia    Low back pain    she sees Dr. Louanne Skye, also Dr. Ernestina Patches for pain medications and Benjiman Core PA for injections    Obesity    Osteopenia 01/2018   T score -1.6 FRAX 7.1% / 1.4%   Peripheral vascular disease ([place])    Resistant hypertension 02/21/2007   Qualifier: Diagnosis of  By: Tiney Rouge CMA, Ellison Hughs     Stroke [facility])    TIA  "many yrs ago"     TIA (transient ischemic attack)    also Hx of it.    Type II or unspecified type diabetes mellitus without mention of complication, not stated as uncontrolled    sees Dr. Carrolyn Meiers      Observations/Objective: Patient sounds cheerful and well on the phone. I do not appreciate any SOB. Speech and thought processing are grossly intact. Patient reported vitals:  Assessment and Plan:  Nasal congestion  Cough, unspecified type  -we discussed possible serious and likely etiologies, options for evaluation and workup, limitations of telemedicine visit vs in person visit, treatment, treatment risks and precautions.  Pt prefers to treat via telemedicine empirically rather than in person at this moment. Query VURI, Covid19 vs other. She has agreed to do home COVID testing.  Advised if positive and she wishes to do antiviral contact a [facility] Monday through Friday or do a virtual visit through [facility] health if on the weekend.  Advised treatment needs to be done within the first 5 days of symptoms.  Otherwise, she reports her symptoms are mild and has opted for symptomatic care with Tessalon Rx for  cough, nasal saline and other symptomatic care measures per patient instructions.  Advised to seek prompt in person care if worsening, new symptoms arise, or if is not improving with treatment. Advised of options for inperson care in case PCP office not available. Did let the patient know that I only do telemedicine shifts for [place] on Tuesdays and Thursdays and advised a follow up visit with PCP or at an [facility] if has further questions or concerns.   Follow Up Instructions:  I did not refer this patient for an OV with me in the next 24 hours for this/these issue(s).  I discussed the assessment and treatment plan with the patient. The patient was provided an opportunity to ask questions and all were answered. The patient agreed with the plan and demonstrated an understanding of the instructions.   I spent 13 minutes on the date of this visit in the care of this patient. See summary of tasks completed to properly care for this patient in the detailed notes above which also included counseling of above, review of PMH, medications, allergies, evaluation of the patient and ordering and/or  instructing patient on testing and care options.     Karen Kern, DO

## 2021-04-27 DIAGNOSIS — H9042 Sensorineural hearing loss, unilateral, left ear, with unrestricted hearing on the contralateral side: Secondary | ICD-10-CM | POA: Diagnosis not present

## 2021-04-27 DIAGNOSIS — I5032 Chronic diastolic (congestive) heart failure: Secondary | ICD-10-CM | POA: Diagnosis not present

## 2021-04-27 DIAGNOSIS — K219 Gastro-esophageal reflux disease without esophagitis: Secondary | ICD-10-CM | POA: Diagnosis not present

## 2021-04-27 DIAGNOSIS — E119 Type 2 diabetes mellitus without complications: Secondary | ICD-10-CM | POA: Diagnosis not present

## 2021-04-27 DIAGNOSIS — I110 Hypertensive heart disease with heart failure: Secondary | ICD-10-CM | POA: Diagnosis not present

## 2021-04-27 DIAGNOSIS — I10 Essential (primary) hypertension: Secondary | ICD-10-CM | POA: Diagnosis not present

## 2021-04-27 DIAGNOSIS — M48061 Spinal stenosis, lumbar region without neurogenic claudication: Secondary | ICD-10-CM | POA: Diagnosis not present

## 2021-04-27 DIAGNOSIS — Z01818 Encounter for other preprocedural examination: Secondary | ICD-10-CM | POA: Diagnosis not present

## 2021-04-27 DIAGNOSIS — C50919 Malignant neoplasm of unspecified site of unspecified female breast: Secondary | ICD-10-CM | POA: Diagnosis not present

## 2021-04-27 DIAGNOSIS — I2510 Atherosclerotic heart disease of native coronary artery without angina pectoris: Secondary | ICD-10-CM | POA: Diagnosis not present

## 2021-05-01 DIAGNOSIS — M4316 Spondylolisthesis, lumbar region: Secondary | ICD-10-CM | POA: Diagnosis not present

## 2021-05-01 DIAGNOSIS — Z1159 Encounter for screening for other viral diseases: Secondary | ICD-10-CM | POA: Diagnosis not present

## 2021-05-01 DIAGNOSIS — Z20822 Contact with and (suspected) exposure to covid-19: Secondary | ICD-10-CM | POA: Diagnosis not present

## 2021-05-02 ENCOUNTER — Ambulatory Visit (HOSPITAL_BASED_OUTPATIENT_CLINIC_OR_DEPARTMENT_OTHER): Payer: Medicare Other | Admitting: Cardiovascular Disease

## 2021-05-02 DIAGNOSIS — E785 Hyperlipidemia, unspecified: Secondary | ICD-10-CM | POA: Diagnosis present

## 2021-05-02 DIAGNOSIS — I110 Hypertensive heart disease with heart failure: Secondary | ICD-10-CM | POA: Diagnosis present

## 2021-05-02 DIAGNOSIS — M48061 Spinal stenosis, lumbar region without neurogenic claudication: Secondary | ICD-10-CM | POA: Diagnosis present

## 2021-05-02 DIAGNOSIS — Z20822 Contact with and (suspected) exposure to covid-19: Secondary | ICD-10-CM | POA: Diagnosis present

## 2021-05-02 DIAGNOSIS — H409 Unspecified glaucoma: Secondary | ICD-10-CM | POA: Diagnosis not present

## 2021-05-02 DIAGNOSIS — I10 Essential (primary) hypertension: Secondary | ICD-10-CM | POA: Diagnosis not present

## 2021-05-02 DIAGNOSIS — I2510 Atherosclerotic heart disease of native coronary artery without angina pectoris: Secondary | ICD-10-CM | POA: Diagnosis present

## 2021-05-02 DIAGNOSIS — R269 Unspecified abnormalities of gait and mobility: Secondary | ICD-10-CM | POA: Diagnosis not present

## 2021-05-02 DIAGNOSIS — Z981 Arthrodesis status: Secondary | ICD-10-CM | POA: Diagnosis not present

## 2021-05-02 DIAGNOSIS — Z7982 Long term (current) use of aspirin: Secondary | ICD-10-CM | POA: Diagnosis not present

## 2021-05-02 DIAGNOSIS — Z853 Personal history of malignant neoplasm of breast: Secondary | ICD-10-CM | POA: Diagnosis not present

## 2021-05-02 DIAGNOSIS — I5032 Chronic diastolic (congestive) heart failure: Secondary | ICD-10-CM | POA: Diagnosis present

## 2021-05-02 DIAGNOSIS — E7849 Other hyperlipidemia: Secondary | ICD-10-CM | POA: Diagnosis not present

## 2021-05-02 DIAGNOSIS — E119 Type 2 diabetes mellitus without complications: Secondary | ICD-10-CM | POA: Diagnosis not present

## 2021-05-02 DIAGNOSIS — Z8673 Personal history of transient ischemic attack (TIA), and cerebral infarction without residual deficits: Secondary | ICD-10-CM | POA: Diagnosis not present

## 2021-05-02 DIAGNOSIS — E1169 Type 2 diabetes mellitus with other specified complication: Secondary | ICD-10-CM | POA: Diagnosis not present

## 2021-05-02 DIAGNOSIS — Z452 Encounter for adjustment and management of vascular access device: Secondary | ICD-10-CM | POA: Diagnosis not present

## 2021-05-02 DIAGNOSIS — Z6830 Body mass index (BMI) 30.0-30.9, adult: Secondary | ICD-10-CM | POA: Diagnosis not present

## 2021-05-02 DIAGNOSIS — I779 Disorder of arteries and arterioles, unspecified: Secondary | ICD-10-CM | POA: Diagnosis not present

## 2021-05-02 DIAGNOSIS — Z743 Need for continuous supervision: Secondary | ICD-10-CM | POA: Diagnosis not present

## 2021-05-02 DIAGNOSIS — Z794 Long term (current) use of insulin: Secondary | ICD-10-CM | POA: Diagnosis not present

## 2021-05-02 DIAGNOSIS — Z23 Encounter for immunization: Secondary | ICD-10-CM | POA: Diagnosis not present

## 2021-05-02 DIAGNOSIS — H905 Unspecified sensorineural hearing loss: Secondary | ICD-10-CM | POA: Diagnosis present

## 2021-05-02 DIAGNOSIS — K219 Gastro-esophageal reflux disease without esophagitis: Secondary | ICD-10-CM | POA: Diagnosis present

## 2021-05-02 DIAGNOSIS — C50919 Malignant neoplasm of unspecified site of unspecified female breast: Secondary | ICD-10-CM | POA: Diagnosis not present

## 2021-05-02 DIAGNOSIS — G8918 Other acute postprocedural pain: Secondary | ICD-10-CM | POA: Diagnosis not present

## 2021-05-02 DIAGNOSIS — K5900 Constipation, unspecified: Secondary | ICD-10-CM | POA: Diagnosis not present

## 2021-05-02 DIAGNOSIS — Z90710 Acquired absence of both cervix and uterus: Secondary | ICD-10-CM | POA: Diagnosis not present

## 2021-05-02 DIAGNOSIS — N179 Acute kidney failure, unspecified: Secondary | ICD-10-CM | POA: Diagnosis not present

## 2021-05-02 DIAGNOSIS — Z7984 Long term (current) use of oral hypoglycemic drugs: Secondary | ICD-10-CM | POA: Diagnosis not present

## 2021-05-02 DIAGNOSIS — D62 Acute posthemorrhagic anemia: Secondary | ICD-10-CM | POA: Diagnosis not present

## 2021-05-02 DIAGNOSIS — I959 Hypotension, unspecified: Secondary | ICD-10-CM | POA: Diagnosis not present

## 2021-05-02 DIAGNOSIS — E1165 Type 2 diabetes mellitus with hyperglycemia: Secondary | ICD-10-CM | POA: Diagnosis present

## 2021-05-02 DIAGNOSIS — I700 Atherosclerosis of aorta: Secondary | ICD-10-CM | POA: Diagnosis not present

## 2021-05-02 DIAGNOSIS — M4316 Spondylolisthesis, lumbar region: Secondary | ICD-10-CM | POA: Diagnosis present

## 2021-05-02 DIAGNOSIS — E669 Obesity, unspecified: Secondary | ICD-10-CM | POA: Diagnosis present

## 2021-05-02 DIAGNOSIS — M47814 Spondylosis without myelopathy or radiculopathy, thoracic region: Secondary | ICD-10-CM | POA: Diagnosis not present

## 2021-05-02 DIAGNOSIS — H9042 Sensorineural hearing loss, unilateral, left ear, with unrestricted hearing on the contralateral side: Secondary | ICD-10-CM | POA: Diagnosis not present

## 2021-05-02 DIAGNOSIS — T380X5A Adverse effect of glucocorticoids and synthetic analogues, initial encounter: Secondary | ICD-10-CM | POA: Diagnosis present

## 2021-05-10 DIAGNOSIS — R531 Weakness: Secondary | ICD-10-CM | POA: Diagnosis not present

## 2021-05-10 DIAGNOSIS — D62 Acute posthemorrhagic anemia: Secondary | ICD-10-CM | POA: Diagnosis not present

## 2021-05-10 DIAGNOSIS — I5032 Chronic diastolic (congestive) heart failure: Secondary | ICD-10-CM | POA: Diagnosis not present

## 2021-05-10 DIAGNOSIS — Z23 Encounter for immunization: Secondary | ICD-10-CM | POA: Diagnosis not present

## 2021-05-10 DIAGNOSIS — I110 Hypertensive heart disease with heart failure: Secondary | ICD-10-CM | POA: Diagnosis not present

## 2021-05-10 DIAGNOSIS — Z743 Need for continuous supervision: Secondary | ICD-10-CM | POA: Diagnosis not present

## 2021-05-10 DIAGNOSIS — K5900 Constipation, unspecified: Secondary | ICD-10-CM | POA: Diagnosis not present

## 2021-05-10 DIAGNOSIS — R269 Unspecified abnormalities of gait and mobility: Secondary | ICD-10-CM | POA: Diagnosis not present

## 2021-05-10 DIAGNOSIS — N179 Acute kidney failure, unspecified: Secondary | ICD-10-CM | POA: Diagnosis not present

## 2021-05-10 DIAGNOSIS — R194 Change in bowel habit: Secondary | ICD-10-CM | POA: Diagnosis not present

## 2021-05-10 DIAGNOSIS — M549 Dorsalgia, unspecified: Secondary | ICD-10-CM | POA: Diagnosis not present

## 2021-05-10 DIAGNOSIS — L7682 Other postprocedural complications of skin and subcutaneous tissue: Secondary | ICD-10-CM | POA: Diagnosis not present

## 2021-05-10 DIAGNOSIS — H9042 Sensorineural hearing loss, unilateral, left ear, with unrestricted hearing on the contralateral side: Secondary | ICD-10-CM | POA: Diagnosis not present

## 2021-05-10 DIAGNOSIS — C50919 Malignant neoplasm of unspecified site of unspecified female breast: Secondary | ICD-10-CM | POA: Diagnosis not present

## 2021-05-10 DIAGNOSIS — I779 Disorder of arteries and arterioles, unspecified: Secondary | ICD-10-CM | POA: Diagnosis not present

## 2021-05-10 DIAGNOSIS — R410 Disorientation, unspecified: Secondary | ICD-10-CM | POA: Diagnosis not present

## 2021-05-10 DIAGNOSIS — I959 Hypotension, unspecified: Secondary | ICD-10-CM | POA: Diagnosis not present

## 2021-05-10 DIAGNOSIS — E119 Type 2 diabetes mellitus without complications: Secondary | ICD-10-CM | POA: Diagnosis not present

## 2021-05-10 DIAGNOSIS — M5441 Lumbago with sciatica, right side: Secondary | ICD-10-CM | POA: Diagnosis not present

## 2021-05-10 DIAGNOSIS — M4316 Spondylolisthesis, lumbar region: Secondary | ICD-10-CM | POA: Diagnosis not present

## 2021-05-10 DIAGNOSIS — I25118 Atherosclerotic heart disease of native coronary artery with other forms of angina pectoris: Secondary | ICD-10-CM | POA: Diagnosis not present

## 2021-05-10 DIAGNOSIS — H409 Unspecified glaucoma: Secondary | ICD-10-CM | POA: Diagnosis not present

## 2021-05-10 DIAGNOSIS — K219 Gastro-esophageal reflux disease without esophagitis: Secondary | ICD-10-CM | POA: Diagnosis not present

## 2021-05-10 DIAGNOSIS — M48061 Spinal stenosis, lumbar region without neurogenic claudication: Secondary | ICD-10-CM | POA: Diagnosis not present

## 2021-05-10 DIAGNOSIS — I10 Essential (primary) hypertension: Secondary | ICD-10-CM | POA: Diagnosis not present

## 2021-05-11 DIAGNOSIS — M5441 Lumbago with sciatica, right side: Secondary | ICD-10-CM | POA: Diagnosis not present

## 2021-05-11 DIAGNOSIS — E119 Type 2 diabetes mellitus without complications: Secondary | ICD-10-CM | POA: Diagnosis not present

## 2021-05-11 DIAGNOSIS — R531 Weakness: Secondary | ICD-10-CM | POA: Diagnosis not present

## 2021-05-11 DIAGNOSIS — I25118 Atherosclerotic heart disease of native coronary artery with other forms of angina pectoris: Secondary | ICD-10-CM | POA: Diagnosis not present

## 2021-05-18 DIAGNOSIS — L7682 Other postprocedural complications of skin and subcutaneous tissue: Secondary | ICD-10-CM | POA: Diagnosis not present

## 2021-05-18 DIAGNOSIS — M4316 Spondylolisthesis, lumbar region: Secondary | ICD-10-CM | POA: Diagnosis not present

## 2021-05-18 DIAGNOSIS — R410 Disorientation, unspecified: Secondary | ICD-10-CM | POA: Diagnosis not present

## 2021-05-19 DIAGNOSIS — R194 Change in bowel habit: Secondary | ICD-10-CM | POA: Diagnosis not present

## 2021-05-19 DIAGNOSIS — I959 Hypotension, unspecified: Secondary | ICD-10-CM | POA: Diagnosis not present

## 2021-05-19 DIAGNOSIS — M549 Dorsalgia, unspecified: Secondary | ICD-10-CM | POA: Diagnosis not present

## 2021-05-23 ENCOUNTER — Ambulatory Visit (HOSPITAL_BASED_OUTPATIENT_CLINIC_OR_DEPARTMENT_OTHER): Payer: Medicare Other | Admitting: Cardiovascular Disease

## 2021-05-26 ENCOUNTER — Ambulatory Visit: Payer: Medicare Other | Admitting: Obstetrics and Gynecology

## 2021-05-29 NOTE — Progress Notes (Signed)
GYNECOLOGY  VISIT   HPI: 75 y.o.   Married  [place] American  female   [phone] with No LMP recorded. Patient has had a hysterectomy.   here for   bleeding with wiping x2 days. Patient states she is not having any bleeding today. No vaginitis symptoms.  Bleeding occurred one week ago.  No discharge or burning.  No new product use.   Hx sebaceous cyst of the right groin.   Some pressure with voiding, which has resolved.  No blood in her urine.  No bleeding with BMs.  Constipation weeks ago, and this has resolved.   Not sexually active.   Taking ASA 81 mg daily.   Hx hysterectomy with USO.   GYNECOLOGIC HISTORY: No LMP recorded. Patient has had a hysterectomy. Contraception:  hysterectomy Menopausal hormone therapy:  none Last mammogram:  11/24/20- bilateral MRI birads 2 benign  Last pap smear:   2011- WNL        OB History     Gravida  2   Para  1   Term  1   Preterm      AB  1   Living  1      SAB  1   IAB      Ectopic      Multiple      Live Births                 Patient Active Problem List   Diagnosis Date Noted   Asymptomatic varicose veins of both lower extremities 11/04/2020   Ductal carcinoma in situ (DCIS) of left breast 01/06/2020   Right-sided thoracic back pain 07/01/2019   Hyperlipidemia 07/17/2017   History of lumbar spinal fusion    History of fusion of lumbar spine    Anemia due to blood loss 11/02/2016    Class: Acute   Lethargy 10/31/2016   AKI (acute kidney injury) ([place]) 10/31/2016   Chronic diastolic CHF (congestive heart failure) ([place]) 10/31/2016   Cough    Leukocytosis    Spondylolisthesis, lumbar region 10/30/2016    Class: Chronic   Spinal stenosis of lumbar region 10/30/2016    Class: Chronic   Retained orthopedic hardware 10/30/2016    Class: Chronic   Spinal stenosis of lumbar region with neurogenic claudication 10/30/2016   Genetic testing 09/14/2015   Atypical ductal hyperplasia of left breast 08/24/2015    Family history of breast cancer    Family history of prostate cancer    Family history of stomach cancer    Abnormal nuclear stress test 12/29/2014   Cervical spondylosis with myelopathy and radiculopathy 06/11/2014   Carpal tunnel syndrome 05/14/2014   Aftercare following surgery of the circulatory system, NEC 03/11/2014   Pain of right lower extremity 03/11/2014   Atypical lobular hyperplasia of left breast 02/12/2014   Fibromyalgia 09/21/2013   Carotid artery disease ([place]) 02/29/2012   Back abscess 01/22/2012   Chest pain with moderate risk of acute coronary syndrome 12/09/2009   Shortness of breath 06/10/2009   Essential hypertension, malignant 03/21/2009   Orthostatic hypotension 01/31/2009   CAD (coronary artery disease) 12/23/2008   Normocytic anemia 07/21/2008   Headache(784.0) 07/21/2008   History of TIA (transient ischemic attack) 07/21/2008   Edema 02/27/2008   Diabetes mellitus with complication ([place]) 33/00/7622   Depression 07/16/2007   Dyslipidemia 02/21/2007   Resistant hypertension 02/21/2007   Asthma 02/21/2007   GERD 02/21/2007   LOW BACK PAIN 02/21/2007    Past Medical History:  Diagnosis  Date   Abnormal nuclear stress test 12/29/2014   Allergy    Anemia, unspecified    Arthritis    Blood transfusion without reported diagnosis    2018   CAD (coronary artery disease)    sees. Dr. Burt Knack. s/p cath with PCI of RCA (xience des) June 2010. Pt also with LAD and D2 dzs-NL FFR in both areas med Rx   Carotid artery occlusion    Cataract    sees Dr. Herbert Deaner    Depressive disorder, not elsewhere classified    Duodenal nodule    Edema    Fibromyalgia    Gastric polyps    COLON   GERD (gastroesophageal reflux disease)    Glaucoma    narrow angle, sees Dr. Shelly Flatten)    Hearing loss    left ear   Heart murmur    questionable   Hemorrhoids    HSV (herpes simplex virus) anogenital infection 05/2014   HTN (hypertension)    Hyperlipidemia     Low back pain    she sees Dr. Louanne Skye, also Dr. Ernestina Patches for pain medications and Benjiman Core PA for injections    Obesity    Osteopenia 01/2018   T score -1.6 FRAX 7.1% / 1.4%   Peripheral vascular disease ([place])    Resistant hypertension 02/21/2007   Qualifier: Diagnosis of  By: Tiney Rouge CMA, Ellison Hughs     Stroke [facility])    TIA  "many yrs ago"     TIA (transient ischemic attack)    also Hx of it.    Type II or unspecified type diabetes mellitus without mention of complication, not stated as uncontrolled    sees Dr. Carrolyn Meiers     Past Surgical History:  Procedure Laterality Date   ABDOMINAL HYSTERECTOMY  age 71   TAH and USO  Leiomyomata   ANTERIOR CERVICAL CORPECTOMY N/A 06/11/2014   Procedure: ANTERIOR CERVICAL CORPECTOMY CERVICAL SIX; WITH CERVICAL FIVE TO CERVICAL SEVEN ARTHRODESIS;  Surgeon: Ashok Pall, MD;  Location: [facility];  Service: Neurosurgery;  Laterality: N/A;   BACK SURGERY  2008   lumb fusion   BREAST BIOPSY Left 01/26/2014   Procedure: EXCISION LEFT BREAST MASS;  Surgeon: Adin Hector, MD;  Location: [facility];  Service: General;  Laterality: Left;   BREAST LUMPECTOMY WITH RADIOACTIVE SEED LOCALIZATION Left 07/24/2017   Procedure: LEFT BREAST LUMPECTOMY WITH RADIOACTIVE SEED LOCALIZATION ERAS;  Surgeon: Erroll Luna, MD;  Location: [facility];  Service: General;  Laterality: Left;   BREAST LUMPECTOMY WITH RADIOACTIVE SEED LOCALIZATION Left 01/13/2020   Procedure: LEFT BREAST LUMPECTOMY WITH RADIOACTIVE SEED LOCALIZATION;  Surgeon: Erroll Luna, MD;  Location: [employer];  Service: General;  Laterality: Left;   CARDIAC CATHETERIZATION     CARDIAC CATHETERIZATION N/A 12/29/2014   Procedure: Left Heart Cath and Coronary Angiography;  Surgeon: Sherren Mocha, MD;  Location: [facility];  Service: Cardiovascular;  Laterality: N/A;   CAROTID ENDARTERECTOMY  march 2012   per Dr. Morton Amy   CARPAL TUNNEL  RELEASE Left 05/14/2014   Procedure: Left Carpal tunnel release;  Surgeon: Ashok Pall, MD;  Location: [facility];  Service: Neurosurgery;  Laterality: Left;  Left Carpal tunnel release   CARPAL TUNNEL RELEASE Bilateral    CHOLECYSTECTOMY     COLONOSCOPY  06/26/2017   per Dr. Henrene Pastor, adenomatous polyp, repeat in 5 yrs     CORONARY STENT PLACEMENT     Dr. Burt Knack  DILATION AND CURETTAGE OF UTERUS     ESOPHAGOGASTRODUODENOSCOPY  02-08-06   per Dr. Henrene Pastor, gastritis    EYE SURGERY Left June 2016   Cataract   GLAUCOMA SURGERY     with laser, per Dr. Henrene Pastor    HARDWARE REMOVAL  10/30/2016   Procedure: HARDWARE REMOVAL;  Surgeon: Jessy Oto, MD;  Location: [facility];  Service: Orthopedics;;   LUMBAR FUSION  2008   POLYPECTOMY     POSTERIOR LUMBAR FUSION  10/30/2016   Removal of hardware rods and pedicle screws lumbar four, Extension of fusion L4-5 to L5-S1 with reinsertion of screws at L 4, left L5-S1 transforaminal lumbar interbody fusion, Exploration  left L5 nerve root, bilateral decompressive laminectomy L3-4/notes 10/30/2016   ULNAR NERVE TRANSPOSITION Left 05/14/2014   Procedure: Left Ulnar nerve decompression;  Surgeon: Ashok Pall, MD;  Location: [facility];  Service: Neurosurgery;  Laterality: Left;  Left Ulnar nerve decompression    Current Outpatient Medications  Medication Sig Dispense Refill   ACCU-CHEK GUIDE test strip 1 each by Other route in the morning and at bedtime.     aspirin EC 81 MG tablet Take 1 tablet (81 mg total) by mouth daily. Swallow whole. 90 tablet 3   benzonatate (TESSALON PERLES) 100 MG capsule Take 1 capsule (100 mg total) by mouth 3 (three) times daily as needed. 20 capsule 0   cetirizine (ZYRTEC) 10 MG tablet Take 1 tablet (10 mg total) by mouth daily. 30 tablet 11   chlorthalidone (HYGROTON) 25 MG tablet Take 1 tablet (25 mg total) by mouth daily. 90 tablet 3   cyclobenzaprine (FLEXERIL) 10 MG tablet Take 1 tablet (10 mg total) by mouth 3 (three) times  daily as needed for muscle spasms. 90 tablet 2   ergocalciferol (VITAMIN D2) 50000 UNITS capsule Take 50,000 Units by mouth every Saturday. On Saturday.     FARXIGA 10 MG TABS tablet Take 10 mg by mouth every morning.     HUMALOG 100 UNIT/ML injection      hydrALAZINE (APRESOLINE) 100 MG tablet Take 1 tablet (100 mg total) by mouth 3 (three) times daily. 270 tablet 2   Insulin Disposable Pump (V-GO 40) KIT Inject 1 application into the skin See admin instructions.     Insulin Human (INSULIN PUMP) SOLN Inject into the skin. Uses Novolog insulin for pump     irbesartan (AVAPRO) 300 MG tablet Take 1 tablet (300 mg total) by mouth daily. 90 tablet 3   isosorbide mononitrate (IMDUR) 60 MG 24 hr tablet TAKE 1 TABLET BY MOUTH ONCE DAILY -  PLEASE  KEEP  UPCOMING  APPOINTMENTIN  JULY  BEFORE  ANYMORE  REFILLS 90 tablet 3   latanoprost (XALATAN) 0.005 % ophthalmic solution Place 1 drop into both eyes at bedtime.      lidocaine (LIDODERM) 5 % Place onto the skin.     metoprolol succinate (TOPROL-XL) 100 MG 24 hr tablet Take 1 tablet (100 mg total) by mouth daily. 90 tablet 3   montelukast (SINGULAIR) 10 MG tablet Take 10 mg by mouth daily.     nitroGLYCERIN (NITRODUR - DOSED IN MG/24 HR) 0.1 mg/hr patch as needed.     nitroGLYCERIN (NITROSTAT) 0.4 MG SL tablet Place 1 tablet (0.4 mg total) under the tongue every 5 (five) minutes as needed for chest pain. 25 tablet 1   oxyCODONE (OXY IR/ROXICODONE) 5 MG immediate release tablet Take by mouth.     OZEMPIC, 1 MG/DOSE, 4 MG/3ML SOPN Inject 1  mg into the skin once a week.     pantoprazole (PROTONIX) 40 MG tablet Take 1 tablet by mouth once daily 90 tablet 1   polyethylene glycol powder (GLYCOLAX/MIRALAX) 17 GM/SCOOP powder Take by mouth.     rosuvastatin (CRESTOR) 40 MG tablet Take 1 tablet by mouth once daily 90 tablet 3   senna-docusate (SENOKOT-S) 8.6-50 MG tablet Take by mouth.     spironolactone (ALDACTONE) 50 MG tablet Take 1 tablet by mouth once daily  90 tablet 3   No current facility-administered medications for this visit.     ALLERGIES: Dulaglutide  Family History  Problem Relation Age of Onset   Hypertension Mother    Hypertension Sister    Hyperlipidemia Sister    Hypertension Brother    Cancer Brother        PROSTATE/LUNG   Diabetes Brother    Heart disease Brother        Before age 59 - Bypass   Varicose Veins Brother    Cancer Father        Prostate and pancreatic    Stomach cancer Maternal Grandmother 65   Heart disease Maternal Grandfather    Breast cancer Maternal Aunt 55   Cancer Maternal Aunt        Stomach   Breast cancer Cousin 74       maternal first cousin   Prostate cancer Cousin 63   Breast cancer Cousin 60   Esophageal cancer Cousin    Breast cancer Cousin 36       maternal first cousin's daughter   Coronary artery disease Neg Hx        premature CAD   Colon cancer Neg Hx    Colon polyps Neg Hx    Rectal cancer Neg Hx     Social History   Socioeconomic History   Marital status: Married    Spouse name: Not on file   Number of children: 1   Years of education: 16   Highest education level: Bachelor's degree (e.g., BA, AB, BS)  Occupational History   Not on file  Tobacco Use   Smoking status: Former    Types: Cigarettes    Quit date: 01/22/1967    Years since quitting: 54.3   Smokeless tobacco: Never  Vaping Use   Vaping Use: Never used  Substance and Sexual Activity   Alcohol use: No    Alcohol/week: 0.0 standard drinks   Drug use: No   Sexual activity: Not Currently    Birth control/protection: Surgical, Post-menopausal    Comment: HYST-1st intercourse 33 yo-5 partners  Other Topics Concern   Not on file  Social History Narrative   Not on file   Social Determinants of Health   Financial Resource Strain: Not on file  Food Insecurity: Not on file  Transportation Needs: Not on file  Physical Activity: Not on file  Stress: Not on file  Social Connections: Not on file   Intimate Partner Violence: Not on file    Review of Systems  Genitourinary:  Positive for vaginal bleeding.  All other systems reviewed and are negative.  PHYSICAL EXAMINATION:    BP 108/60 (BP Location: Right Arm, Patient Position: Sitting, Cuff Size: Normal)   Pulse 66   Resp 14   Wt 163 lb 12.8 oz (74.3 kg)   BMI 29.02 kg/m     General appearance: alert, cooperative and appears stated age   Pelvic: External genitalia:  right labia majora with sebaceous cysts - no erythema  or drainage.              Urethra:  normal appearing urethra with no masses, tenderness or lesions              Bartholins and Skenes: normal                 Vagina: normal appearing vagina with normal color and discharge, no lesions              Cervix: absent                Bimanual Exam:  Uterus:  absent.  Anterior vaginal wall tenderness.               Adnexa: no mass, fullness, tenderness              Rectal exam: Yes.  .  Confirms.              Anus:  normal sphincter tone, no lesions  Chaperone was present for exam:  Raquel Sarna, RN  ASSESSMENT  Status post hysterectomy with USO.  Hx vulvar sebaceous cyst.  Bladder pressure.  Vaginal bleeding.  No evidence of bleeding on exam today.   PLAN  Reassurance that bleeding does not appear to be gynecologic.  Will check urinalysis and culture.  If bleeding recurs, I recommend she see her PCP to rule out GI origin.  Fu prn.    An After Visit Summary was printed and given to the patient.  22 min total time was spent for this patient encounter, including preparation, face-to-face counseling with the patient, coordination of care, and documentation of the encounter.

## 2021-05-30 ENCOUNTER — Other Ambulatory Visit: Payer: Self-pay

## 2021-05-30 ENCOUNTER — Encounter: Payer: Self-pay | Admitting: Obstetrics and Gynecology

## 2021-05-30 ENCOUNTER — Ambulatory Visit (INDEPENDENT_AMBULATORY_CARE_PROVIDER_SITE_OTHER): Payer: Medicare Other | Admitting: Obstetrics and Gynecology

## 2021-05-30 VITALS — BP 108/60 | HR 66 | Resp 14 | Wt 163.8 lb

## 2021-05-30 DIAGNOSIS — I2510 Atherosclerotic heart disease of native coronary artery without angina pectoris: Secondary | ICD-10-CM

## 2021-05-30 DIAGNOSIS — R3989 Other symptoms and signs involving the genitourinary system: Secondary | ICD-10-CM

## 2021-05-30 DIAGNOSIS — N939 Abnormal uterine and vaginal bleeding, unspecified: Secondary | ICD-10-CM

## 2021-06-01 LAB — URINALYSIS W MICROSCOPIC + REFLEX CULTURE
Bilirubin Urine: NEGATIVE
Casts: NONE SEEN /LPF
Crystals: NONE SEEN /HPF
Hgb urine dipstick: NEGATIVE
Hyaline Cast: NONE SEEN /LPF
Ketones, ur: NEGATIVE
Nitrites, Initial: NEGATIVE
Specific Gravity, Urine: 1.01 (ref 1.001–1.035)
pH: 6 (ref 5.0–8.0)

## 2021-06-01 LAB — URINE CULTURE
MICRO NUMBER:: 12669492
Result:: NO GROWTH
SPECIMEN QUALITY:: ADEQUATE

## 2021-06-01 LAB — CULTURE INDICATED

## 2021-06-12 DIAGNOSIS — Z79899 Other long term (current) drug therapy: Secondary | ICD-10-CM | POA: Diagnosis not present

## 2021-06-12 DIAGNOSIS — M47814 Spondylosis without myelopathy or radiculopathy, thoracic region: Secondary | ICD-10-CM | POA: Diagnosis not present

## 2021-06-12 DIAGNOSIS — M79652 Pain in left thigh: Secondary | ICD-10-CM | POA: Diagnosis not present

## 2021-06-12 DIAGNOSIS — M47812 Spondylosis without myelopathy or radiculopathy, cervical region: Secondary | ICD-10-CM | POA: Diagnosis not present

## 2021-06-12 DIAGNOSIS — Z4789 Encounter for other orthopedic aftercare: Secondary | ICD-10-CM | POA: Diagnosis not present

## 2021-06-12 DIAGNOSIS — M79651 Pain in right thigh: Secondary | ICD-10-CM | POA: Diagnosis not present

## 2021-06-12 DIAGNOSIS — M4316 Spondylolisthesis, lumbar region: Secondary | ICD-10-CM | POA: Diagnosis not present

## 2021-06-12 DIAGNOSIS — M4326 Fusion of spine, lumbar region: Secondary | ICD-10-CM | POA: Diagnosis not present

## 2021-06-12 DIAGNOSIS — M47816 Spondylosis without myelopathy or radiculopathy, lumbar region: Secondary | ICD-10-CM | POA: Diagnosis not present

## 2021-06-20 ENCOUNTER — Ambulatory Visit (HOSPITAL_BASED_OUTPATIENT_CLINIC_OR_DEPARTMENT_OTHER): Payer: Medicare Other | Admitting: Cardiovascular Disease

## 2021-06-28 DIAGNOSIS — M25551 Pain in right hip: Secondary | ICD-10-CM | POA: Diagnosis not present

## 2021-06-28 DIAGNOSIS — M545 Low back pain, unspecified: Secondary | ICD-10-CM | POA: Diagnosis not present

## 2021-06-28 DIAGNOSIS — M6281 Muscle weakness (generalized): Secondary | ICD-10-CM | POA: Diagnosis not present

## 2021-07-13 DIAGNOSIS — M6281 Muscle weakness (generalized): Secondary | ICD-10-CM | POA: Diagnosis not present

## 2021-07-13 DIAGNOSIS — M545 Low back pain, unspecified: Secondary | ICD-10-CM | POA: Diagnosis not present

## 2021-07-13 DIAGNOSIS — M25551 Pain in right hip: Secondary | ICD-10-CM | POA: Diagnosis not present

## 2021-07-18 DIAGNOSIS — M545 Low back pain, unspecified: Secondary | ICD-10-CM | POA: Diagnosis not present

## 2021-07-18 DIAGNOSIS — M25551 Pain in right hip: Secondary | ICD-10-CM | POA: Diagnosis not present

## 2021-07-18 DIAGNOSIS — M6281 Muscle weakness (generalized): Secondary | ICD-10-CM | POA: Diagnosis not present

## 2021-07-20 DIAGNOSIS — M545 Low back pain, unspecified: Secondary | ICD-10-CM | POA: Diagnosis not present

## 2021-07-20 DIAGNOSIS — M25551 Pain in right hip: Secondary | ICD-10-CM | POA: Diagnosis not present

## 2021-07-20 DIAGNOSIS — M6281 Muscle weakness (generalized): Secondary | ICD-10-CM | POA: Diagnosis not present

## 2021-07-25 DIAGNOSIS — M6281 Muscle weakness (generalized): Secondary | ICD-10-CM | POA: Diagnosis not present

## 2021-07-25 DIAGNOSIS — M25551 Pain in right hip: Secondary | ICD-10-CM | POA: Diagnosis not present

## 2021-07-25 DIAGNOSIS — M545 Low back pain, unspecified: Secondary | ICD-10-CM | POA: Diagnosis not present

## 2021-07-27 DIAGNOSIS — M6281 Muscle weakness (generalized): Secondary | ICD-10-CM | POA: Diagnosis not present

## 2021-07-27 DIAGNOSIS — M545 Low back pain, unspecified: Secondary | ICD-10-CM | POA: Diagnosis not present

## 2021-07-27 DIAGNOSIS — M25551 Pain in right hip: Secondary | ICD-10-CM | POA: Diagnosis not present

## 2021-07-31 ENCOUNTER — Telehealth: Payer: Self-pay | Admitting: Family Medicine

## 2021-07-31 DIAGNOSIS — H838X3 Other specified diseases of inner ear, bilateral: Secondary | ICD-10-CM | POA: Diagnosis not present

## 2021-07-31 DIAGNOSIS — H6121 Impacted cerumen, right ear: Secondary | ICD-10-CM | POA: Diagnosis not present

## 2021-07-31 DIAGNOSIS — H903 Sensorineural hearing loss, bilateral: Secondary | ICD-10-CM | POA: Diagnosis not present

## 2021-07-31 NOTE — Progress Notes (Signed)
°  Chronic Care Management   Note  07/31/2021 Name: [name] MRN: [id number] DOB: August 11, 1945  [name] is a 76 y.o. year old female who is a primary care patient of Laurey Morale, MD. I reached out to [name] by phone today in response to a referral sent by [name]'s PCP, Laurey Morale, MD.   [name] was given information about Chronic Care Management services today including:  CCM service includes personalized support from designated clinical staff supervised by her physician, including individualized plan of care and coordination with other care providers 24/7 contact phone numbers for assistance for urgent and routine care needs. Service will only be billed when office clinical staff spend 20 minutes or more in a month to coordinate care. Only one practitioner may furnish and bill the service in a calendar month. The patient may stop CCM services at any time (effective at the end of the month) by phone call to the office staff.   Patient agreed to services and verbal consent obtained.   Follow up plan:   Tatjana Secretary/administrator

## 2021-07-31 NOTE — Chronic Care Management (AMB) (Signed)
ERROR

## 2021-08-01 DIAGNOSIS — M25551 Pain in right hip: Secondary | ICD-10-CM | POA: Diagnosis not present

## 2021-08-01 DIAGNOSIS — M545 Low back pain, unspecified: Secondary | ICD-10-CM | POA: Diagnosis not present

## 2021-08-01 DIAGNOSIS — M6281 Muscle weakness (generalized): Secondary | ICD-10-CM | POA: Diagnosis not present

## 2021-08-15 DIAGNOSIS — M25551 Pain in right hip: Secondary | ICD-10-CM | POA: Diagnosis not present

## 2021-08-15 DIAGNOSIS — M545 Low back pain, unspecified: Secondary | ICD-10-CM | POA: Diagnosis not present

## 2021-08-15 DIAGNOSIS — M6281 Muscle weakness (generalized): Secondary | ICD-10-CM | POA: Diagnosis not present

## 2021-08-17 ENCOUNTER — Other Ambulatory Visit: Payer: Self-pay | Admitting: Physician Assistant

## 2021-08-17 ENCOUNTER — Other Ambulatory Visit: Payer: Self-pay | Admitting: Family Medicine

## 2021-08-17 DIAGNOSIS — M25551 Pain in right hip: Secondary | ICD-10-CM | POA: Diagnosis not present

## 2021-08-17 DIAGNOSIS — M545 Low back pain, unspecified: Secondary | ICD-10-CM | POA: Diagnosis not present

## 2021-08-17 DIAGNOSIS — M6281 Muscle weakness (generalized): Secondary | ICD-10-CM | POA: Diagnosis not present

## 2021-08-22 DIAGNOSIS — M25551 Pain in right hip: Secondary | ICD-10-CM | POA: Diagnosis not present

## 2021-08-22 DIAGNOSIS — M6281 Muscle weakness (generalized): Secondary | ICD-10-CM | POA: Diagnosis not present

## 2021-08-22 DIAGNOSIS — M545 Low back pain, unspecified: Secondary | ICD-10-CM | POA: Diagnosis not present

## 2021-08-29 DIAGNOSIS — M545 Low back pain, unspecified: Secondary | ICD-10-CM | POA: Diagnosis not present

## 2021-08-29 DIAGNOSIS — M25551 Pain in right hip: Secondary | ICD-10-CM | POA: Diagnosis not present

## 2021-08-29 DIAGNOSIS — M6281 Muscle weakness (generalized): Secondary | ICD-10-CM | POA: Diagnosis not present

## 2021-09-05 DIAGNOSIS — M25551 Pain in right hip: Secondary | ICD-10-CM | POA: Diagnosis not present

## 2021-09-05 DIAGNOSIS — M545 Low back pain, unspecified: Secondary | ICD-10-CM | POA: Diagnosis not present

## 2021-09-05 DIAGNOSIS — M6281 Muscle weakness (generalized): Secondary | ICD-10-CM | POA: Diagnosis not present

## 2021-09-08 ENCOUNTER — Telehealth: Payer: Self-pay | Admitting: Pharmacist

## 2021-09-08 NOTE — Chronic Care Management (AMB) (Signed)
? ? ?Chronic Care Management ?Pharmacy Assistant  ? ?Name: [name]  MRN: [id number] DOB: [birth year] ? ?[name] is an 76 y.o. year old female who presents for her initial CCM visit with the clinical pharmacist. ? ?Reason for Encounter: Chart prep for initial visit with Jeni Salles, Clinical Pharmacist on 09/13/2021 at 11:00 in the office. ?  ?Conditions to be addressed/monitored: ?CAD, HTN, HLD, DMII, Depression, GERD, and Asthma ? ?Recent office visits:  ?04/25/2021 Colin Benton DO - Patient was seen for nasal congestion and additional issues. Started Benzonatate 12m three times daily as needed and discontinued Hycodan, Ibuprofen, Mycolog and Tramadol. Follow up with your doctor in 2-3 days unless improving and feeling better. ? ?01/25/2021 SAlysia PennaMD - Patient was seen for Bilateral carotid artery disease, unspecified type. No medication changes. No follow up noted.  ? ?11/03/2020 SAlysia PennaMD - Patient was seen for Asymptomatic varicose veins of both lower extremities and additional issues. No medication changes. No follow up noted. ? ?Recent consult visits:  ?05/30/2021 BNunzio Cobbs(OBGYN) - Patient was seen for Sensation of pressure in bladder area and an additional issues. No medication changes. Follow up if symptoms worsen or fail to improve. ? ?04/24/2021 TSkeet LatchMD (cardiology) - Patient was seen for Coronary artery disease involving native coronary artery of native heart without angina pectoris and additional issues. Discontinued Voltaren Gel. Follow up in 6 months.  ? ?03/23/2021 YTruitt MerleMD (oncology) - Patient was seen for Ductal carcinoma in situ (DCIS) of left breast.  No medication changes. No follow up noted.  ? ?Hospital visits:  ?05/10/2021 [place] [facility] [place] in [place]. ? ? ? ?Medications: ?Outpatient Encounter Medications as of 09/08/2021  ?Medication Sig  ? ACCU-CHEK GUIDE test strip 1  each by Other route in the morning and at bedtime.  ? aspirin EC 81 MG tablet Take 1 tablet (81 mg total) by mouth daily. Swallow whole.  ? benzonatate (TESSALON PERLES) 100 MG capsule Take 1 capsule (100 mg total) by mouth 3 (three) times daily as needed.  ? cetirizine (ZYRTEC) 10 MG tablet Take 1 tablet (10 mg total) by mouth daily.  ? chlorthalidone (HYGROTON) 25 MG tablet Take 1 tablet (25 mg total) by mouth daily.  ? cyclobenzaprine (FLEXERIL) 10 MG tablet Take 1 tablet (10 mg total) by mouth 3 (three) times daily as needed for muscle spasms.  ? ergocalciferol (VITAMIN D2) 50000 UNITS capsule Take 50,000 Units by mouth every Saturday. On Saturday.  ? FARXIGA 10 MG TABS tablet Take 10 mg by mouth every morning.  ? HUMALOG 100 UNIT/ML injection   ? hydrALAZINE (APRESOLINE) 100 MG tablet Take 1 tablet (100 mg total) by mouth 3 (three) times daily.  ? Insulin Disposable Pump (V-GO 40) KIT Inject 1 application into the skin See admin instructions.  ? Insulin Human (INSULIN PUMP) SOLN Inject into the skin. Uses Novolog insulin for pump  ? irbesartan (AVAPRO) 300 MG tablet Take 1 tablet (300 mg total) by mouth daily.  ? isosorbide mononitrate (IMDUR) 60 MG 24 hr tablet TAKE 1 & 1/2 (ONE & ONE-HALF) TABLETS BY MOUTH ONCE DAILY  ? latanoprost (XALATAN) 0.005 % ophthalmic solution Place 1 drop into both eyes at bedtime.   ? metoprolol succinate (TOPROL-XL) 100 MG 24 hr tablet Take 1 tablet (100 mg total) by mouth daily.  ? montelukast (SINGULAIR) 10 MG tablet Take 10 mg by  mouth daily.  ? nitroGLYCERIN (NITRODUR - DOSED IN MG/24 HR) 0.1 mg/hr patch as needed.  ? nitroGLYCERIN (NITROSTAT) 0.4 MG SL tablet Place 1 tablet (0.4 mg total) under the tongue every 5 (five) minutes as needed for chest pain.  ? OZEMPIC, 1 MG/DOSE, 4 MG/3ML SOPN Inject 1 mg into the skin once a week.  ? pantoprazole (PROTONIX) 40 MG tablet Take 1 tablet by mouth once daily  ? polyethylene glycol powder (GLYCOLAX/MIRALAX) 17 GM/SCOOP powder Take by  mouth.  ? rosuvastatin (CRESTOR) 40 MG tablet Take 1 tablet by mouth once daily  ? senna-docusate (SENOKOT-S) 8.6-50 MG tablet Take by mouth.  ? spironolactone (ALDACTONE) 50 MG tablet Take 1 tablet by mouth once daily  ? ?No facility-administered encounter medications on file as of 09/08/2021.  ?Fill History: ?CHLORTHALIDONE 25MG    TAB 06/02/2021 90  ? ?FARXIGA 10MG TAB 11/18/2020 90  ? ?VITAMIN D2 (ERGO) 1.25MG  CAP 07/28/2021 84  ? ?GABAPENTIN 300MG    CAP 07/11/2021 30  ? ?V-GO 40             KIT 06/02/2021 30  ? ?HUMALOG 100 UNIT/ML VIAL 03/27/2021 90  ? ?IRBESARTAN 300MG     TAB 06/21/2021 90  ? ?ISOSORB MONO ER 60MG TAB 08/18/2021 90  ? ?LATANOPROST .005% OP SOL 06/30/2021 57  ? ?METOPROLOL ER 100MG TAB 06/11/2021 90  ? ?MONTELUKAST 10MG TAB 06/26/2021 90  ? ?PANTOPRAZOLE 40MG TAB 08/18/2021 90  ? ?ROSUVASTATIN 40MG TAB 06/26/2021 90  ? ?OZEMPIC 4MG/3ML 1MG/DOSE PEN 03/04/2021 28  ? ?SPIRONOLACTONE 50MG    TAB 08/31/2021 90  ? ?HydrALAZINE 100MG   TAB 07/28/2021 90  ? ? ?Have you seen any other providers since your last visit? ** ? ?Any changes in your medications or health?  ? ?Any side effects from any medications?  ? ?Do you have an symptoms or problems not managed by your medications?  ? ?Any concerns about your health right now?  ? ?Has your provider asked that you check blood pressure, blood sugar, or follow special diet at home?  ? ?Do you get any type of exercise on a regular basis?  ? ?Can you think of a goal you would like to reach for your health?  ? ?Do you have any problems getting your medications?  ? ?Is there anything that you would like to discuss during the appointment?  ? ?Please bring medications and supplements to appointment ? ?Notes: ?Spoke with patient and she does not understand why she has an appointment with Jeni Salles, no one told her about this.  She states another doctor is managing medications just fine.  She decided she would keep this appointment with [facility] however, she  refused to answer any of the chart prep questions.  Patient states she will just review them with Watt Climes at her appointment! ? ?Care Gaps: ?AWV - message sent to [name] ?Last BP - 108/60 on 05/30/2021 ?Last A1C - 6.0 on 05/03/2021 ?Foot exam - never done ?Hep C Screen - never done ?TDAP - never done ?Shingrix - never done ?HGA1C - over due ?Flu vaccine - overdue ? ?Star Rating Drugs: ?Farxiga 10 mg - last filled 11/18/2020 90 [facility] at [facility] with [facility] ?Irbesartan 300 mg  - last filled 06/21/2021 90 DS at [facility] ?Rosuvastatin 40 mg - last filled 06/26/2021 90 [facility] ?Ozempic 51m/3ml - [facility] ? ?JGennie AlmaCMA  ?Clinical Pharmacist Assistant ?[phone]? ?

## 2021-09-11 ENCOUNTER — Telehealth: Payer: Self-pay | Admitting: Pharmacist

## 2021-09-11 NOTE — Chronic Care Management (AMB) (Signed)
? ? ?  Chronic Care Management ?Pharmacy Assistant  ? ?Name: [name]  MRN: [id number] DOB: [birth year] ? ?09/13/2021 APPOINTMENT REMINDER ? ? ?Called [name], No answer, left message of appointment on 09/13/2021 at 11:00 via office visit with Jeni Salles, Pharm D. Notified to have all medications, supplements, blood pressure monitor,  blood pressure and/or blood sugar logs available during appointment and to return call if need to reschedule. ? ? ?Care Gaps: ?AWV - message sent to [name] ?Last BP - 108/60 on 05/30/2021 ?Last A1C - 6.0 on 05/03/2021 ?Foot exam - never done ?Hep C Screen - never done ?TDAP - never done ?Shingrix - never done ?HGA1C - over due ?Flu vaccine - overdue ?  ?Star Rating Drugs: ?Farxiga 10 mg - last filled 11/18/2020 90 [facility] at [facility] verified with [facility] ?Irbesartan 300 mg  - last filled 06/21/2021 90 DS at [facility] ?Rosuvastatin 40 mg - last filled 06/26/2021 90 DS at [facility] ?Ozempic '4mg'$ /37m - [facility] ? ? ?Any gaps in medications fill history? Yes ? ?JGennie AlmaCMA  ?Clinical Pharmacist Assistant ?[phone]? ?

## 2021-09-13 ENCOUNTER — Ambulatory Visit (INDEPENDENT_AMBULATORY_CARE_PROVIDER_SITE_OTHER): Payer: Medicare Other | Admitting: Pharmacist

## 2021-09-13 NOTE — Progress Notes (Unsigned)
Chronic Care Management Pharmacy Note  09/13/2021 Name:  Marialice Newkirk MRN:  [id number] DOB:  1946-04-09  Summary: ***  Recommendations/Changes made from today's visit: ***  Plan: ***   Subjective: Adajah Cocking is an 76 y.o. year old female who is a primary patient of Laurey Morale, MD.  The CCM team was consulted for assistance with disease management and care coordination needs.    Engaged with patient face to face for initial visit in response to provider referral for pharmacy case management and/or care coordination services.   Consent to Services:  The patient was given the following information about Chronic Care Management services today, agreed to services, and gave verbal consent: 1. CCM service includes personalized support from designated clinical staff supervised by the primary care provider, including individualized plan of care and coordination with other care providers 2. 24/7 contact phone numbers for assistance for urgent and routine care needs. 3. Service will only be billed when office clinical staff spend 20 minutes or more in a month to coordinate care. 4. Only one practitioner may furnish and bill the service in a calendar month. 5.The patient may stop CCM services at any time (effective at the end of the month) by phone call to the office staff. 6. The patient will be responsible for cost sharing (co-pay) of up to 20% of the service fee (after annual deductible is met). Patient agreed to services and consent obtained.  Patient Care Team: Laurey Morale, MD as PCP - Cyndia Diver, MD as PCP - Cardiology (Cardiology) Reynold Bowen, MD as Consulting Physician (Endocrinology) Ashok Pall, MD as Consulting Physician (Neurosurgery) Earlie Server, MD as Consulting Physician (Orthopedic Surgery) Mauro Kaufmann, RN as Oncology Nurse Navigator Rockwell Germany, RN as Oncology Nurse Navigator Sharmon Revere as Physician Assistant  (Cardiology) Viona Gilmore, [facility] as Pharmacist (Pharmacist)  Recent office visits: 04/25/2021 Colin Benton DO - Patient was seen for nasal congestion and additional issues. Started Benzonatate 185m three times daily as needed and discontinued Hycodan, Ibuprofen, Mycolog and Tramadol. Follow up with your doctor in 2-3 days unless improving and feeling better.   01/25/2021 SAlysia PennaMD - Patient was seen for Bilateral carotid artery disease, unspecified type. No medication changes. No follow up noted.   Recent consult visits: 05/30/2021 BNunzio Cobbs(OBGYN) - Patient was seen for Sensation of pressure in bladder area and an additional issues. No medication changes. Follow up if symptoms worsen or fail to improve.   04/24/2021 TSkeet LatchMD (cardiology) - Patient was seen for Coronary artery disease involving native coronary artery of native heart without angina pectoris and additional issues. Discontinued Voltaren Gel. Follow up in 6 months.    03/23/2021 YTruitt MerleMD (oncology) - Patient was seen for Ductal carcinoma in situ (DCIS) of left breast.  No medication changes. No follow up noted.   Hospital visits: 05/10/2021 -05/20/[place] in [place].  [place]25[place].  Objective:  Lab Results  Component Value Date   CREATININE 0.92 03/23/2021   BUN 14 03/23/2021   GFR 68.46 02/20/2019   EGFR 66 03/23/2021   GFRNONAA >60 03/23/2021   GFRAA 59 (L) 03/23/2020   NA 146 (H) 03/23/2021   K 3.9 03/23/2021   CALCIUM 9.6 03/23/2021   CO2 26 03/23/2021   GLUCOSE  37 (LL) 03/23/2021    Lab Results  Component Value Date/Time   HGBA1C 15.2 (H) 02/20/2019 10:22 AM   HGBA1C 9.3 (H) 03/27/2017 04:15 PM   GFR 68.46 02/20/2019 10:22 AM   GFR 78.76 03/26/2017 11:22 AM   MICROALBUR 1.7 03/21/2012 11:14 AM    MICROALBUR 0.7 03/23/2011 11:44 AM    Last diabetic Eye exam:  Lab Results  Component Value Date/Time   HMDIABEYEEXA Retinopathy (A) 04/12/2021 08:57 AM    Last diabetic Foot exam: No results found for: HMDIABFOOTEX   Lab Results  Component Value Date   CHOL 113 03/23/2021   HDL 49 03/23/2021   LDLCALC 50 03/23/2021   TRIG 63 03/23/2021   CHOLHDL 2.3 03/23/2021    Hepatic Function Latest Ref Rng & Units 03/23/2021 03/23/2021 03/23/2020  Total Protein 6.0 - 8.5 g/dL 6.8 6.6 6.9  Albumin 3.7 - 4.7 g/dL 4.5 3.7 3.7  AST 0 - 40 IU/L 23 20 22   ALT 0 - 32 IU/L 18 19 21   Alk Phosphatase 44 - 121 IU/L 79 68 69  Total Bilirubin 0.0 - 1.2 mg/dL 0.6 0.7 0.6  Bilirubin, Direct 0.0 - 0.2 mg/dL - - -    Lab Results  Component Value Date/Time   TSH 2.60 03/09/2020 10:43 AM   TSH 2.60 02/20/2019 10:22 AM    CBC Latest Ref Rng & Units 03/23/2021 03/23/2020 03/09/2020  WBC 4.0 - 10.5 K/uL 8.8 8.0 9.2  Hemoglobin 12.0 - 15.0 g/dL 11.7(L) 13.1 13.1  Hematocrit 36.0 - 46.0 % 37.2 41.5 40.0  Platelets 150 - 400 K/uL 209 177 212    Lab Results  Component Value Date/Time   VD25OH 55.00 12/28/2014 10:56 AM    Clinical ASCVD: Yes  The ASCVD Risk score (Arnett DK, et al., 2019) failed to calculate for the following reasons:   The valid total cholesterol range is 130 to 320 mg/dL    Depression screen PHQ 2/9 05/07/2019  Decreased Interest 0  Down, Depressed, Hopeless 1  PHQ - 2 Score 1  Some recent data might be hidden     ***Other: (CHADS2VASc if Afib, MMRC or CAT for COPD, ACT, DEXA)  Social History   Tobacco Use  Smoking Status Former   Types: Cigarettes   Quit date: 01/22/1967   Years since quitting: 54.6  Smokeless Tobacco Never   BP Readings from Last 3 Encounters:  05/30/21 108/60  04/25/21 128/70  04/24/21 122/60   Pulse Readings from Last 3 Encounters:  05/30/21 66  04/24/21 70  03/23/21 63   Wt Readings from Last 3 Encounters:  05/30/21 163 lb 12.8 oz (74.3 kg)   04/24/21 177 lb 4.8 oz (80.4 kg)  03/23/21 176 lb 8 oz (80.1 kg)   BMI Readings from Last 3 Encounters:  05/30/21 29.02 kg/m  04/24/21 31.41 kg/m  03/23/21 31.27 kg/m    Assessment/Interventions: Review of patient past medical history, allergies, medications, health status, including review of consultants reports, laboratory and other test data, was performed as part of comprehensive evaluation and provision of chronic care management services.   SDOH:  (Social Determinants of Health) assessments and interventions performed: {yes/no:20286}  SDOH Screenings   Alcohol Screen: Not on file  Depression (GYI9-4): Not on file  Financial Resource Strain: Not on file  Food Insecurity: Not on file  Housing: Not on file  Physical Activity: Not on file  Social Connections: Not on file  Stress: Not on file  Tobacco Use: Medium Risk   Smoking Tobacco Use:  Former   Smokeless Tobacco Use: Never   Passive Exposure: Not on file  Transportation Needs: Not on file   Patient is retired and doesn't do anything if she has to. She goes to PT twice a week which started in January. She had PT and OT after the surgery. Patient lives with her husband and he is semi-retired.  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 Seems like it's getting better since surgery.   Patient loves to sleep - no issues, no problem going to bed; mother had issues with sleep - all 3 kids are not like that; patient feels tired some - takes a nap some but usually so busy with everything that; napping every once in a while  Patient eats out 2-3 times a week and only restaurants and doesn't eat fast food and doesn't like it. Patient does eat 3 meals a day and has lost a lot of weight since Ozempic.  Patient does PT exercises and stretching for back and has some stiffness in hips and lower back. Patient repots it is  painful to walk with rod. Patient does love water therapy and did that for years. They don't have a pool at current therapist. [facility] has a heated pool -  [facility] orders this.  She can only hear out of right ear.  Patient medications has been taking them for so long. Patient reports the V-go kit is expensive. Patient assistance for v-go? Patient reports she knows what all of her medications are for.   Patient was checking BP and blood sugars every day.  CCM Care Plan  Allergies  Allergen Reactions   Dulaglutide     Other reaction(s): made her sick    Medications Reviewed Today     Reviewed by Matthew Folks, RN (Registered Nurse) on 05/30/21 at 1041  Med List Status: <None>   Medication Order Taking? Sig Documenting Provider Last Dose Status Informant  ACCU-CHEK GUIDE test strip [id number] Yes 1 each by Other route in the morning and at bedtime. [provider] Taking Active   aspirin EC 81 MG tablet [id number] Yes Take 1 tablet (81 mg total) by mouth daily. Swallow whole. Richardson Dopp T, PA-C Taking Active   benzonatate (TESSALON PERLES) 100 MG capsule [id number] Yes Take 1 capsule (100 mg total) by mouth 3 (three) times daily as needed. [name], DO Taking Active   cetirizine (ZYRTEC) 10 MG tablet [id number] Yes Take 1 tablet (10 mg total) by mouth daily. Laurey Morale, MD Taking Active   chlorthalidone (HYGROTON) 25 MG tablet [id number] Yes Take 1 tablet (25 mg total) by mouth daily. Skeet Latch, MD Taking Active   cyclobenzaprine (FLEXERIL) 10 MG tablet [id number] Yes Take 1 tablet (10 mg total) by mouth 3 (three) times daily as needed for muscle spasms. Laurey Morale, MD Taking Active   ergocalciferol (VITAMIN D2) 50000 UNITS capsule [id number] Yes Take 50,000 Units by mouth every Saturday. On Saturday. [provider] Taking Active Self  FARXIGA 10 MG TABS tablet [id number] Yes Take 10 mg by mouth every morning. [provider] Taking  Active   HUMALOG 100 UNIT/ML injection [id number] Yes  [provider] Taking Active   hydrALAZINE (APRESOLINE) 100 MG tablet [id number] Yes Take 1 tablet (100 mg total) by mouth 3 (three) times daily. Loel Dubonnet, NP Taking Active  Insulin Disposable Pump (V-GO 40) KIT [id number] Yes Inject 1 application into the skin See admin instructions. [provider] Taking Active   Insulin Human (INSULIN PUMP) SOLN [id number] Yes Inject into the skin. Uses Novolog insulin for pump [provider] Taking Active   irbesartan (AVAPRO) 300 MG tablet [id number] Yes Take 1 tablet (300 mg total) by mouth daily. Loel Dubonnet, NP Taking Active   isosorbide mononitrate (IMDUR) 60 MG 24 hr tablet [id number] Yes TAKE 1 TABLET BY MOUTH ONCE DAILY -  PLEASE  KEEP  UPCOMING  [place]  ANYMORE  REFILLS Richardson Dopp T, PA-C Taking Active   latanoprost (XALATAN) 0.005 % ophthalmic solution [id number] Yes Place 1 drop into both eyes at bedtime.  [provider] Taking Active Self  lidocaine (LIDODERM) 5 % [id number] Yes Place onto the skin. [provider] Taking Active   metoprolol succinate (TOPROL-XL) 100 MG 24 hr tablet [id number] Yes Take 1 tablet (100 mg total) by mouth daily. Loel Dubonnet, NP Taking Active   montelukast (SINGULAIR) 10 MG tablet [id number] Yes Take 10 mg by mouth daily. [provider] Taking Active Self  nitroGLYCERIN (NITRODUR - DOSED IN MG/24 HR) 0.1 mg/hr patch [id number] Yes as needed. [provider] Taking Active   nitroGLYCERIN (NITROSTAT) 0.4 MG SL tablet [id number] Yes Place 1 tablet (0.4 mg total) under the tongue every 5 (five) minutes as needed for chest pain. Erlene Quan, PA-C Taking Active Self  oxyCODONE (OXY IR/ROXICODONE) 5 MG immediate release tablet [id number] Yes Take by mouth. [provider] Taking Active   OZEMPIC, 1 MG/DOSE, 4 MG/3ML SOPN [id number] Yes Inject 1 mg into the skin once a  week. [provider] Taking Active   pantoprazole (PROTONIX) 40 MG tablet [id number] Yes Take 1 tablet by mouth once daily Laurey Morale, MD Taking Active   polyethylene glycol powder (GLYCOLAX/MIRALAX) 17 GM/SCOOP powder [id number] Yes Take by mouth. [provider] Taking Active   rosuvastatin (CRESTOR) 40 MG tablet [id number] Yes Take 1 tablet by mouth once daily Richardson Dopp T, PA-C Taking Active   senna-docusate (SENOKOT-S) 8.6-50 MG tablet [id number] Yes Take by mouth. [provider] Taking Active   spironolactone (ALDACTONE) 50 MG tablet [id number] Yes Take 1 tablet by mouth once daily Sherren Mocha, MD Taking Active             Patient Active Problem List   Diagnosis Date Noted   Asymptomatic varicose veins of both lower extremities 11/04/2020   Ductal carcinoma in situ (DCIS) of left breast 01/06/2020   Right-sided thoracic back pain 07/01/2019   Hyperlipidemia 07/17/2017   History of lumbar spinal fusion    History of fusion of lumbar spine    Anemia due to blood loss 11/02/2016    Class: Acute   Lethargy 10/31/2016   AKI (acute kidney injury) ([place]) 10/31/2016   Chronic diastolic CHF (congestive heart failure) ([place]) 10/31/2016   Cough    Leukocytosis    Spondylolisthesis, lumbar region 10/30/2016    Class: Chronic   Spinal stenosis of lumbar region 10/30/2016    Class: Chronic   Retained orthopedic hardware 10/30/2016    Class: Chronic   Spinal stenosis of lumbar region with neurogenic claudication 10/30/2016   Genetic testing 09/14/2015   Atypical ductal hyperplasia of left breast 08/24/2015   Family history of breast cancer    Family history of prostate cancer    Family history of stomach cancer    Abnormal  nuclear stress test 12/29/2014   Cervical spondylosis with myelopathy and radiculopathy 06/11/2014   Carpal tunnel syndrome 05/14/2014   Aftercare following surgery of the circulatory system, NEC 03/11/2014   Pain of right lower  extremity 03/11/2014   Atypical lobular hyperplasia of left breast 02/12/2014   Fibromyalgia 09/21/2013   Carotid artery disease ([place]) 02/29/2012   Back abscess 01/22/2012   Chest pain with moderate risk of acute coronary syndrome 12/09/2009   Shortness of breath 06/10/2009   Essential hypertension, malignant 03/21/2009   Orthostatic hypotension 01/31/2009   CAD (coronary artery disease) 12/23/2008   Normocytic anemia 07/21/2008   Headache(784.0) 07/21/2008   History of TIA (transient ischemic attack) 07/21/2008   Edema 02/27/2008   Diabetes mellitus with complication ([place]) 31/59/4585   Depression 07/16/2007   Dyslipidemia 02/21/2007   Resistant hypertension 02/21/2007   Asthma 02/21/2007   GERD 02/21/2007   LOW BACK PAIN 02/21/2007    Immunization History  Administered Date(s) Administered   Influenza Split 03/26/2012, 04/21/2013   Influenza, High Dose Seasonal PF 03/26/2017   Influenza-Unspecified 04/08/2014, 04/08/2016, 04/08/2020   PFIZER(Purple Top)SARS-COV-2 Vaccination 08/29/2019, 09/19/2019, 04/15/2020   Pfizer Covid-19 Vaccine Bivalent Booster 65yr & up 05/10/2021   Pneumococcal Conjugate-13 03/26/2017   Pneumococcal Polysaccharide-23 03/09/2020    Conditions to be addressed/monitored:  Hypertension, Hyperlipidemia, Diabetes, Coronary Artery Disease, GERD, Asthma, and Depression  There are no care plans that you recently modified to display for this patient.   Current Barriers:  {pharmacybarriers:24917}  Pharmacist Clinical Goal(s):  Patient will {PHARMACYGOALCHOICES:24921} through collaboration with PharmD and provider.   Interventions: 1:1 collaboration with FLaurey Morale MD regarding development and update of comprehensive plan of care as evidenced by provider attestation and co-signature Inter-disciplinary care team collaboration (see longitudinal plan of care) Comprehensive medication review performed; medication list updated in electronic medical  record BP Readings from Last 3 Encounters:  05/30/21 108/60  04/25/21 128/70  04/24/21 122/60   Hypertension (BP goal <130/80) -Controlled -Current treatment: Chlorthalidone 25 mg 1 tablet daily - AM Hydralazine 100 mg 1 tablet three times daily (sometimes skipping afternoon dose) Irbesartan 300 mg 1 tablet daily - PM Metoprolol succinate 100 mg 1 tablet daily - PM Spironolactone 50 mg 1 tablet daily - AM -Medications previously tried: amlodipine, carvedilol (swelling), clonidine, doxazosin (swelling) -Current home readings: 140-180/70-80s (checking every morning) - forgot doses last night - dr rOval Linseygave it to her -Current dietary habits: loves salt; low sodium salt - does very little cooking - eats frozen foods, not much canned foods -Current exercise habits: limited -{ACTIONS;DENIES/REPORTS:21021675::"Denies"} hypotensive/hypertensive symptoms -Educated on {CCM BP Counseling:25124} -Counseled to monitor BP at home ***, document, and provide log at future appointments -{CCMPHARMDINTERVENTION:25122} -only one episode of recent dizziness -no headaches -doesn't drink a lot of water - doesn't like water, propel water (zero sugar) -thermos of water at night - unsweetened with lemonade -diet pepsi -no coffee in the morning -   Lab Results  Component Value Date   CHOL 113 03/23/2021   HDL 49 03/23/2021   LDLCALC 50 03/23/2021   TRIG 63 03/23/2021   CHOLHDL 2.3 03/23/2021    Hyperlipidemia: (LDL goal < 55) -Controlled -Current treatment: Rosuvastatin 40 mg 1 tablet daily -Medications previously tried: ***  -Current dietary patterns: *** -Current exercise habits: *** -Educated on {CCM HLD Counseling:25126} -{CCMPHARMDINTERVENTION:25122}  CAD (Goal: ***) -{US controlled/uncontrolled:25276} -Current treatment  Aspirin 81 mg 1 tablet daily Nitroglcerin 0.4 mg SL 1 tablet as needed Isosorbide mononitrate 60 mg 1.5 tablets daily -Medications previously  tried: ***   -{CCMPHARMDINTERVENTION:25122}   Lab Results  Component Value Date   HGBA1C 15.2 (H) 02/20/2019     Diabetes (A1c goal <7%) -{US controlled/uncontrolled:25276} -Current medications: Ozempic? Not filled in months? Farxiga 10 mg 1 tablet daily Humalog U-100 into the pump -Medications previously tried: ***  -Current home glucose readings fasting glucose: ***  post prandial glucose: *** -{ACTIONS;DENIES/REPORTS:21021675::"Denies"} hypoglycemic/hyperglycemic symptoms -Current meal patterns:  breakfast: ***  lunch: ***  dinner: *** snacks: *** drinks: *** -Current exercise: *** -Educated on {CCM DM COUNSELING:25123} -Counseled to check feet daily and get yearly eye exams -{CCMPHARMDINTERVENTION:25122} -CGM? -Ozempic lost 46 pounds -getting PAP for Ozempic and Farxiga -getting insulin through [facility] - cost? She is not sure -CGM?   Allergic rhinitis? (Goal: ***) -{US controlled/uncontrolled:25276} -Current treatment  Montelukast 10 mg 1 tablet  - may ask to see if she can would  -Medications previously tried: ***  -{CCMPHARMDINTERVENTION:25122} -she doesn't think she has allergies  -wheezing at night -smaller she got the less wheezing   GERD (Goal: ***) -{US controlled/uncontrolled:25276} -Current treatment  Pantoprazole 40 mg 1 tablet daily -Medications previously tried: ***  -{CCMPHARMDINTERVENTION:25122} -well controlled - no acid reflux  -tapers?  Last vitamin D Lab Results  Component Value Date   VD25OH 55.00 12/28/2014   Vitamin D deficiency??? (Goal: ***) -{US controlled/uncontrolled:25276} -Current treatment  Vitamin D 50,000 units once weekly -Medications previously tried: ***  -{CCMPHARMDINTERVENTION:25122} -take with food - saturdays   Health Maintenance -Vaccine gaps: shingrix, tetanus, influenza -Current therapy:  Latanoprost 0.005% 1 drop in both eyes at bedtime Restasis - provided patient assistance  Osteo bi flex daily - not  taking? -Educated on {ccm supplement counseling:25128} -{CCM Patient satisfied:25129} -{CCMPHARMDINTERVENTION:25122} -lactose intolerance - almond milk - not often, when eating cereal, cheese and yogurt bothers her  Patient Goals/Self-Care Activities Patient will:  - {pharmacypatientgoals:24919}  Follow Up Plan: {CM FOLLOW UP DIYM:41583}   Medication Assistance: {MEDASSISTANCEINFO:25044}   Compliance/Adherence/Medication fill history: Care Gaps: Foot exam, A1c, shingrix, influenza (Nov 2nd), tetanus, Hep C screening Last BP - 108/60 on 05/30/2021 Last A1C - 6.0 on 05/03/2021  Star-Rating Drugs: Farxiga 10 mg - last filled 11/18/2020 [facility] with [place] Irbesartan 300 mg  - last filled 06/21/2021 90 DS at [facility] Rosuvastatin 40 mg - last filled 06/26/2021 90 DS at [facility] 82m/3ml - [facility] Patient's preferred pharmacy is:  [place] [address] [place] [address]Phone: [phone]: [phone] Uses pill box? Yes Pt endorses ***% compliance -   We discussed: {Pharmacy options:24294} Patient decided to: {US Pharmacy Plan:23885}  Care Plan and Follow Up Patient Decision:  {FOLLOWUP:24991}  Plan: {CM FOLLOW UP PKMMN:81771} MJeni Salles PharmD, BCACP Clinical Pharmacist {MP practice sites:26434} [phone] AVS - calcium citrate 600 mg twice daily - add this Tetanus and shingles  All in morning   -vitamin D Sat  -spiro 83 tabs Ros 28 tBA ISO 117 IRBESRTAN 57 TABS METORPOL 20 TABS CHRLOAHTL 160 TABS HYDRALAZINE - LOOK AT LAST FILL  5 VITAMIN d MONTELUKAST 63 TABS PANTO 60 TABS

## 2021-09-19 DIAGNOSIS — M6281 Muscle weakness (generalized): Secondary | ICD-10-CM | POA: Diagnosis not present

## 2021-09-19 DIAGNOSIS — M25551 Pain in right hip: Secondary | ICD-10-CM | POA: Diagnosis not present

## 2021-09-19 DIAGNOSIS — M545 Low back pain, unspecified: Secondary | ICD-10-CM | POA: Diagnosis not present

## 2021-09-21 ENCOUNTER — Telehealth: Payer: Self-pay | Admitting: Cardiovascular Disease

## 2021-09-21 ENCOUNTER — Telehealth: Payer: Self-pay | Admitting: Pharmacist

## 2021-09-21 ENCOUNTER — Telehealth (HOSPITAL_BASED_OUTPATIENT_CLINIC_OR_DEPARTMENT_OTHER): Payer: Self-pay | Admitting: Family

## 2021-09-21 MED ORDER — ROSUVASTATIN CALCIUM 40 MG PO TABS: 40.0000 mg | ORAL_TABLET | Freq: Every day | ORAL | 1 refills | Status: DC

## 2021-09-21 MED ORDER — METOPROLOL SUCCINATE ER 100 MG PO TB24: 100.0000 mg | ORAL_TABLET | Freq: Every day | ORAL | 1 refills | Status: DC

## 2021-09-21 MED ORDER — CHLORTHALIDONE 25 MG PO TABS: 25.0000 mg | ORAL_TABLET | Freq: Every day | ORAL | 1 refills | Status: DC

## 2021-09-21 MED ORDER — HYDRALAZINE HCL 100 MG PO TABS: 100.0000 mg | ORAL_TABLET | Freq: Three times a day (TID) | ORAL | 1 refills | Status: DC

## 2021-09-21 MED ORDER — SPIRONOLACTONE 50 MG PO TABS: 50.0000 mg | ORAL_TABLET | Freq: Every day | ORAL | 1 refills | Status: DC

## 2021-09-21 MED ORDER — ISOSORBIDE MONONITRATE ER 60 MG PO TB24: ORAL_TABLET | ORAL | 1 refills | Status: DC

## 2021-09-21 MED ORDER — IRBESARTAN 300 MG PO TABS: 300.0000 mg | ORAL_TABLET | Freq: Every day | ORAL | 1 refills | Status: DC

## 2021-09-21 NOTE — Telephone Encounter (Signed)
Pt's medication was sent to pt's pharmacy as requested. Confirmation received.  °

## 2021-09-21 NOTE — Telephone Encounter (Signed)
Rx(s) sent to pharmacy electronically.  

## 2021-09-21 NOTE — Telephone Encounter (Signed)
?*  STAT* If patient is at the pharmacy, call can be transferred to refill team. ? ? ?1. Which medications need to be refilled? (please list name of each medication and dose if known) new prescriptions- changing pharmacy- Metoprolol, Chlorthalidone, Hydralazine, Irbesartan ? ?2. Which pharmacy/location (including street and city if local pharmacy) is medication to be sent to?[facility] ? ?3. Do they need a 30 day or 90 day supply? 30 days and refills ? ?

## 2021-09-21 NOTE — Telephone Encounter (Signed)
?*  STAT* If patient is at the pharmacy, call can be transferred to refill team. ? ? ?1. Which medications need to be refilled? (please list name of each medication and dose if known)  ?spironolactone (ALDACTONE) 50 MG tablet ?rosuvastatin (CRESTOR) 40 MG tablet ?isosorbide mononitrate (IMDUR) 60 MG 24 hr tablet ? ?2. Which pharmacy/location (including street and city if local pharmacy) is medication to be sent to? [facility] fax # [phone] ? ?3. Do they need a 30 day or 90 day supply? 30 day  ?

## 2021-09-21 NOTE — Chronic Care Management (AMB) (Addendum)
? ? ?Chronic Care Management ?Pharmacy Assistant  ? ?Name: [name]  MRN: [id number] DOB: 1946/03/02 ?  ?Referred by: Laurey Morale, MD  ?Reason for referral: Chronic care management ? ?Reviewed chart for medication changes ahead of medication coordination call. ? ?BP Readings from Last 3 Encounters:  ?05/30/21 108/60  ?04/25/21 128/70  ?04/24/21 122/60  ?  ?Lab Results  ?Component Value Date  ? HGBA1C 15.2 (H) 02/20/2019  ?  ? ?Verbal consent obtained for [facility] enhanced pharmacy services (medication synchronization, adherence packaging, delivery coordination). A medication sync plan was created to allow patient to get all medications delivered once every 30 to 90 days per patient preference. Patient understands they have freedom to choose pharmacy and clinical pharmacist will coordinate care between all prescribers and [facility]. ? ?Patient requested to obtain medications through Adherence Packaging  30 Days  ? ?Med Sync Plan: ? ?Vitamin D 50000 iu - take 1 capsule weekly at breakfast on Saturday X         09/13/2021 #5 October 21, 2021  ?Spironolactone 50 mg  Sherren Mocha, MD  1     #83 on 09/13/2021 Dec 05, 2021  ?Rosuvastatin 40 mg  Sherren Mocha, MD  1     #28 on 09/13/2021 October 11, 2021  ?Isosorbide 60 mg  Sherren Mocha, MD  1.5     #117 on 09/13/2021 Nov 30, 2021  ?Montelukast 10 mg  Reynold Bowen, MD     1  #63 on 09/13/2021 Nov 15, 2021  ?Pantoprazole 40 mg X    1     #60 on 09/13/2021 Nov 12, 2021  ?Metoprolol Succinate 100 mg Loel Dubonnet, NP     1  #20 on 09/13/2021 October 03, 2021  ?Chlorthalidone 25 mg Loel Dubonnet, NP  1     #160 on 09/13/2021 February 20, 2022  ?Hydralazine 100 mg  Loel Dubonnet, NP  _0 07/28/2021 #270 (90 DS) October 26, 2021  ?Irbesartan 300 mg  Loel Dubonnet, NP     1  #57 on 09/13/2021 Nov 09, 2021  ?Gabapentin 300 mg - PRN will call when needed Melinda Crutch, MD          ? ? ?Patient will need a short fill:    ? ?Prescriptions requested from PCP and specialists. ?LM with Lorain Childes CMA to send new script for Singulair to [facility] ?Spoke with Evette at Dr. Honor Junes, requested new scripts for Spironolactone, Rosuvastatin and Isosorbide to be sent to [facility].  ?Spoke with  Sunday Spillers at Dr Leone Brand office , requested new scripts for Metoprolol, Chlorthalidone, Hydralazine and Irbesartan to be sent to [facility].  ?Left message Abigail Butts at Dr. Ane Payment office ([facility] in [place] [phone]), requesting a  new script for Gabapentin to be sent to [facility] in [place], [name].  ? ?Medications: ?Outpatient Encounter Medications as of 09/21/2021  ?Medication Sig  ? ACCU-CHEK GUIDE test strip 1 each by Other route in the morning and at bedtime.  ? aspirin EC 81 MG tablet Take 1 tablet (81 mg total) by mouth daily. Swallow whole.  ? chlorthalidone (HYGROTON) 25 MG tablet Take 1 tablet (25 mg total) by mouth daily.  ? ergocalciferol (VITAMIN D2) 50000 UNITS capsule Take 50,000 Units by mouth every Saturday. On Saturday.  ? FARXIGA 10 MG TABS tablet Take 10 mg by mouth every morning.  ? gabapentin (NEURONTIN) 300 MG capsule Take 300 mg by mouth as  needed.  ? HUMALOG 100 UNIT/ML injection   ? hydrALAZINE (APRESOLINE) 100 MG tablet Take 1 tablet (100 mg total) by mouth 3 (three) times daily.  ? Insulin Disposable Pump (V-GO 40) KIT Inject 1 application into the skin See admin instructions.  ? irbesartan (AVAPRO) 300 MG tablet Take 1 tablet (300 mg total) by mouth daily.  ? isosorbide mononitrate (IMDUR) 60 MG 24 hr tablet TAKE 1 & 1/2 (ONE & ONE-HALF) TABLETS BY MOUTH ONCE DAILY  ? latanoprost (XALATAN) 0.005 % ophthalmic solution Place 1 drop into both eyes at bedtime.   ? metoprolol succinate (TOPROL-XL) 100 MG 24 hr tablet Take 1 tablet (100 mg total) by mouth daily.  ? montelukast (SINGULAIR) 10 MG tablet Take 10 mg by mouth daily.  ? nitroGLYCERIN (NITROSTAT) 0.4 MG SL tablet Place 1 tablet (0.4 mg total)  under the tongue every 5 (five) minutes as needed for chest pain. (Patient not taking: Reported on 09/13/2021)  ? OZEMPIC, 1 MG/DOSE, 4 MG/3ML SOPN Inject 1 mg into the skin once a week.  ? pantoprazole (PROTONIX) 40 MG tablet Take 1 tablet by mouth once daily  ? Probiotic Product (PROBIOTIC ADVANCED PO) Take 1 capsule by mouth daily.  ? rosuvastatin (CRESTOR) 40 MG tablet Take 1 tablet by mouth once daily  ? spironolactone (ALDACTONE) 50 MG tablet Take 1 tablet by mouth once daily  ? ?No facility-administered encounter medications on file as of 09/21/2021.  ? ? ?Care Gaps: ?AWV - message sent to [name] ?Last BP - 108/60 on 05/30/2021 ?Last A1C - 6.0 on 05/03/2021 ([facility]) ?Foot exam - never done ?Hep C Screen - never done ?TDAP - never done ?Shingrix - never done ?Flu vaccine - overdue ? ?Star Rating Drugs: ?Farxiga 10 mg - last [facility]/18/2020 [facility] with [facility] ?Irbesartan 300 mg  - last filled 06/21/2021 90 [facility] ?Rosuvastatin 40 mg - last filled 06/26/2021 90 DS at [facility] ?Ozempic 40m/3ml - [facility] ? ?JGennie AlmaCMA  ?Clinical Pharmacist Assistant ?[phone]? ?

## 2021-09-22 NOTE — Patient Instructions (Addendum)
Hi [name], ? ?It was great to get to meet you in person! Below is a summary of some of the topics we discussed.  ? ?Also, don't forget to: ?Fill out the Restasis patient assistance paperwork I gave you and give to your eye doctor ?Start supplementing with calcium citrate 500-600 mg twice daily ?Try to get your shingles and tetanus vaccines at the pharmacy ?Talk to Dr. Forde Dandy about the montelukast to make sure you really need this as you weren't sure why you were taking it ? ?Please reach out to me if you have any questions or need anything before our follow up! ? ?Best, ?Maddie ? ?Jeni Salles, PharmD, BCACP ?Clinical Pharmacist ?Therapist, music at [place] ?[phone] ? ?Visit Information ? ? Goals Addressed   ? ?  ?  ?  ?  ? This Visit's Progress  ?  Track and Manage My Blood Pressure-Hypertension     ?  Timeframe:  Long-Range Goal ?Priority:  High ?Start Date:                             ?Expected End Date:                      ? ?Follow Up Date 11/13/21  ?  ?- check blood pressure weekly ?- choose a place to take my blood pressure (home, clinic or office, retail store) ?- write blood pressure results in a log or diary  ?  ?Why is this important?   ?You won't feel high blood pressure, but it can still hurt your blood vessels.  ?High blood pressure can cause heart or kidney problems. It can also cause a stroke.  ?Making lifestyle changes like losing a little weight or eating less salt will help.  ?Checking your blood pressure at home and at different times of the day can help to control blood pressure.  ?If the doctor prescribes medicine remember to take it the way the doctor ordered.  ?Call the office if you cannot afford the medicine or if there are questions about it.   ?  ?Notes:  ?  ? ?  ? ?Patient Care Plan: [place]  ?  ? ?Problem Identified: Problem: Hypertension, Hyperlipidemia, Diabetes, Coronary Artery Disease, GERD, Asthma, and Depression   ?  ? ?Long-Range Goal: Patient-Specific  Goal   ?Start Date: 09/13/2021  ?Expected End Date: 09/14/2022  ?This Visit's Progress: On track  ?Priority: High  ?Note:   ?Current Barriers:  ?Unable to independently afford treatment regimen ?Unable to independently monitor therapeutic efficacy ? ?Pharmacist Clinical Goal(s):  ?Patient will verbalize ability to afford treatment regimen ?achieve adherence to monitoring guidelines and medication adherence to achieve therapeutic efficacy through collaboration with PharmD and provider.  ? ?Interventions: ?1:1 collaboration with Laurey Morale, MD regarding development and update of comprehensive plan of care as evidenced by provider attestation and co-signature ?Inter-disciplinary care team collaboration (see longitudinal plan of care) ?Comprehensive medication review performed; medication list updated in electronic medical record ? ?Hypertension (BP goal <130/80) ?-Controlled ?-Current treatment: ?Chlorthalidone 25 mg 1 tablet daily - AM - Appropriate, Effective, Safe, Accessible ?Hydralazine 100 mg 1 tablet three times daily (sometimes skipping afternoon dose) - Appropriate, Effective, Safe, Accessible ?Irbesartan 300 mg 1 tablet daily - PM - Appropriate, Effective, Safe, Accessible ?Metoprolol succinate 100 mg 1 tablet daily - PM - Appropriate, Effective, Safe, Accessible ?Spironolactone 50 mg 1 tablet daily - AM - Appropriate,  Effective, Safe, Accessible ?-Medications previously tried: amlodipine, carvedilol (swelling), clonidine, doxazosin (swelling)  ?-Current home readings: 140-180/70-80s (checking every morning with arm cuff Dr. Oval Linsey provided) - forgot doses last night  ?-Current dietary habits: loves salt; recommended low sodium salt, does very little cooking, eats frozen foods, not much canned foods ?-Current exercise habits: limited due to pain ?-Denies hypotensive/hypertensive symptoms ?-Educated on BP goals and benefits of medications for prevention of heart attack, stroke and kidney damage; ?Daily salt  intake goal < 2300 mg; ?Importance of home blood pressure monitoring; ?Proper BP monitoring technique; ?Symptoms of hypotension and importance of maintaining adequate hydration; ?-Counseled to monitor BP at home daily, document, and provide log at future appointments ?-Counseled on diet and exercise extensively ?Recommended to continue current medication ?Recommended switching to lower sodium salt to lower salt intake. ? ?Hyperlipidemia: (LDL goal < 55) ?-Controlled ?-Current treatment: ?Rosuvastatin 40 mg 1 tablet daily - Appropriate, Effective, Safe, Accessible ?-Medications previously tried: none ?-Current dietary patterns: tries not to eat fried foods ?-Current exercise habits: limited due to pain ?-Educated on Cholesterol goals;  ?Benefits of statin for ASCVD risk reduction; ?Exercise goal of 150 minutes per week; ?-Counseled on diet and exercise extensively ?Recommended to continue current medication ? ?CAD (Goal: prevent heart events) ?-Controlled ?-Current treatment  ?Aspirin 81 mg 1 tablet daily - Appropriate, Effective, Safe, Accessible ?Nitroglcerin 0.4 mg SL 1 tablet as needed - Appropriate, Effective, Safe, Accessible ?Isosorbide mononitrate 60 mg 1.5 tablets daily - Appropriate, Effective, Safe, Accessible ?Rosuvastatin 40 mg 1 tablet daily - Appropriate, Effective, Safe, Accessible ?-Medications previously tried: none  ?-Recommended to continue current medication ? ? ?Diabetes (A1c goal <7%) ?-Controlled ?-Current medications: ?Ozempic 1 mg inject once weekly - Appropriate, Effective, Safe, Accessible ?Farxiga 10 mg 1 tablet daily - Appropriate, Effective, Safe, Accessible ?Humalog U-100 into the pump - Appropriate, Effective, Safe, Accessible ?-Medications previously tried: unknown  ?-Current home glucose readings ?fasting glucose: did not provide ?post prandial glucose: did not provide ?-Denies hypoglycemic/hyperglycemic symptoms ?-Current meal patterns:  ?breakfast: n/a  ?lunch: n/a  ?dinner:  n/a ?snacks: n/a ?drinks: propel water (zero sugar), unsweetened tea with lemonade, diet pepsi ?-Current exercise: limited due to pain ?-Educated on A1c and blood sugar goals; ?Benefits of routine self-monitoring of blood sugar; ?Continuous glucose monitoring; ?Carbohydrate counting and/or plate method ?-Counseled to check feet daily and get yearly eye exams ?-Counseled on diet and exercise extensively ?Recommended to continue current medication ? ?Osteopenia (Goal prevent fractures) ?-Not ideally controlled ?-Last DEXA Scan: 2019  ? T-Score femoral neck: -1.6 ? T-Score total hip: -0.6 ? T-Score lumbar spine: 1.2 ? T-Score forearm radius: n/a ? 10-year probability of major osteoporotic fracture: 12% ? 10-year probability of hip fracture: 2.9% ?-Patient is not a candidate for pharmacologic treatment ?-Current treatment  ?Vitamin D 50000 units once weekly - Appropriate, Effective, Safe, Accessible ?-Medications previously tried: none  ?-Recommend [phone] units of vitamin D daily. Recommend 1200 mg of calcium daily from dietary and supplemental sources. Recommend weight-bearing and muscle strengthening exercises for building and maintaining bone density. ?-Recommended repeat DEXA. ?Recommend supplementing with calcium 600 mg twice daily. ? ?Allergic rhinitis (Goal: minimize symptoms) ?-Controlled ?-Current treatment  ?Montelukast 10 mg 1 tablet daily - Query Appropriate, Query effective, Safe, Accessible ?-Medications previously tried: none  ?-Recommended for patient to discuss stopping as she is unsure why she is on this medication. ? ?GERD (Goal: minimize symptoms of acid reflux) ?-Controlled ?-Current treatment  ?Pantoprazole 40 mg 1 tablet daily - Query Appropriate, Effective, Safe,  Accessible ?-Medications previously tried: none  ?-Counseled on long term risks of taking PPIs. ?Recommended stepping down to every other day use. ? ?Vitamin D deficiency (Goal: 30-100) ?-Controlled ?-Current treatment  ?Vitamin D  50,000 units once weekly (Saturdays) - Appropriate, Effective, Safe, Accessible ?-Medications previously tried: none  ?-Counseled on importance of taking with a meal. ? ?Health Maintenance ?-Vaccine gaps: shingri

## 2021-09-25 ENCOUNTER — Telehealth: Payer: Self-pay | Admitting: Family Medicine

## 2021-09-25 DIAGNOSIS — M5136 Other intervertebral disc degeneration, lumbar region: Secondary | ICD-10-CM | POA: Diagnosis not present

## 2021-09-25 DIAGNOSIS — M4317 Spondylolisthesis, lumbosacral region: Secondary | ICD-10-CM | POA: Diagnosis not present

## 2021-09-25 DIAGNOSIS — M21161 Varus deformity, not elsewhere classified, right knee: Secondary | ICD-10-CM | POA: Diagnosis not present

## 2021-09-25 DIAGNOSIS — M5134 Other intervertebral disc degeneration, thoracic region: Secondary | ICD-10-CM | POA: Diagnosis not present

## 2021-09-25 DIAGNOSIS — M810 Age-related osteoporosis without current pathological fracture: Secondary | ICD-10-CM

## 2021-09-25 DIAGNOSIS — M47814 Spondylosis without myelopathy or radiculopathy, thoracic region: Secondary | ICD-10-CM | POA: Diagnosis not present

## 2021-09-25 DIAGNOSIS — M170 Bilateral primary osteoarthritis of knee: Secondary | ICD-10-CM | POA: Diagnosis not present

## 2021-09-25 DIAGNOSIS — M21162 Varus deformity, not elsewhere classified, left knee: Secondary | ICD-10-CM | POA: Diagnosis not present

## 2021-09-25 DIAGNOSIS — Z981 Arthrodesis status: Secondary | ICD-10-CM | POA: Diagnosis not present

## 2021-09-25 DIAGNOSIS — M47816 Spondylosis without myelopathy or radiculopathy, lumbar region: Secondary | ICD-10-CM | POA: Diagnosis not present

## 2021-09-25 NOTE — Telephone Encounter (Signed)
-----   Message from Viona Gilmore, [facility] sent at 09/22/2021  7:59 AM EDT ----- ?Regarding: DEXA and PPI question ?Hi, ? ?Would you be able to order a repeat DEXA for her? It looks like she has osteopenia and hasn't had it repeated since 2019. ? ?She inquired about whether or not she truly needed all of her medications and it seems that her GERD is well controlled but she's been on pantoprazole for a while without stopping. I didn't see an indication for life long therapy (like Barrett's) but I am may have missed something. Are you ok with me tapering her off if she tolerates it? ? ?Let me know your thoughts! ?Thanks, ?Maddie ? ?

## 2021-09-25 NOTE — Telephone Encounter (Signed)
Done

## 2021-10-06 DIAGNOSIS — I10 Essential (primary) hypertension: Secondary | ICD-10-CM | POA: Diagnosis not present

## 2021-10-06 DIAGNOSIS — E118 Type 2 diabetes mellitus with unspecified complications: Secondary | ICD-10-CM

## 2021-10-10 DIAGNOSIS — M25551 Pain in right hip: Secondary | ICD-10-CM | POA: Diagnosis not present

## 2021-10-10 DIAGNOSIS — M545 Low back pain, unspecified: Secondary | ICD-10-CM | POA: Diagnosis not present

## 2021-10-10 DIAGNOSIS — M6281 Muscle weakness (generalized): Secondary | ICD-10-CM | POA: Diagnosis not present

## 2021-10-12 ENCOUNTER — Telehealth: Payer: Self-pay | Admitting: Pharmacist

## 2021-10-12 DIAGNOSIS — H402233 Chronic angle-closure glaucoma, bilateral, severe stage: Secondary | ICD-10-CM | POA: Diagnosis not present

## 2021-10-12 DIAGNOSIS — H04123 Dry eye syndrome of bilateral lacrimal glands: Secondary | ICD-10-CM | POA: Diagnosis not present

## 2021-10-12 NOTE — Telephone Encounter (Signed)
Called patient to follow up from CCM visit after discussion with PCP. Scheduled for DEXA next week. ? ?Patient is to decreased pantoprazole to every other day for a few weeks to see how she tolerates. Patient is aware to take Tums PRN on the off days if she is having symptoms. Will follow up on symptoms/tolerance in 2-3 weeks. ? ?Updated patient's record to reflect recent tetanus and shingle vaccinations. ?

## 2021-10-17 DIAGNOSIS — M545 Low back pain, unspecified: Secondary | ICD-10-CM | POA: Diagnosis not present

## 2021-10-17 DIAGNOSIS — M6281 Muscle weakness (generalized): Secondary | ICD-10-CM | POA: Diagnosis not present

## 2021-10-17 DIAGNOSIS — M25551 Pain in right hip: Secondary | ICD-10-CM | POA: Diagnosis not present

## 2021-10-19 ENCOUNTER — Inpatient Hospital Stay: Admission: RE | Admit: 2021-10-19 | Payer: Medicare Other | Source: Ambulatory Visit

## 2021-10-24 ENCOUNTER — Telehealth: Payer: Self-pay | Admitting: Family Medicine

## 2021-10-24 ENCOUNTER — Telehealth: Payer: Self-pay | Admitting: Pharmacist

## 2021-10-24 DIAGNOSIS — M6281 Muscle weakness (generalized): Secondary | ICD-10-CM | POA: Diagnosis not present

## 2021-10-24 DIAGNOSIS — M25551 Pain in right hip: Secondary | ICD-10-CM | POA: Diagnosis not present

## 2021-10-24 DIAGNOSIS — M545 Low back pain, unspecified: Secondary | ICD-10-CM | POA: Diagnosis not present

## 2021-10-24 NOTE — Telephone Encounter (Signed)
Handicap Placard form to be filled out.  Placed in dr's folder.  Call [phone] upon completion.  ?

## 2021-10-24 NOTE — Telephone Encounter (Signed)
Handicap Placard was received and placed on Dr Sarajane Jews red folder ?

## 2021-10-24 NOTE — Chronic Care Management (AMB) (Signed)
? ? ?  Chronic Care Management ?Pharmacy Assistant  ? ?Name: [name]  MRN: [id number] DOB: 1946-04-23 ? ?Reason for Encounter: Return patients call ? ?Returned patients call to [employer].  ?Patient states she did not receive the Restasis application that was to be mailed to her, she states it was not in with the letter sent to her.  ? ? ?Gennie Alma CMA  ?Clinical Pharmacist Assistant ?[phone] ? ?

## 2021-10-26 ENCOUNTER — Other Ambulatory Visit: Payer: Self-pay

## 2021-10-31 ENCOUNTER — Telehealth: Payer: Self-pay | Admitting: Pharmacist

## 2021-10-31 DIAGNOSIS — M545 Low back pain, unspecified: Secondary | ICD-10-CM | POA: Diagnosis not present

## 2021-10-31 DIAGNOSIS — M6281 Muscle weakness (generalized): Secondary | ICD-10-CM | POA: Diagnosis not present

## 2021-10-31 DIAGNOSIS — M25551 Pain in right hip: Secondary | ICD-10-CM | POA: Diagnosis not present

## 2021-10-31 NOTE — Chronic Care Management (AMB) (Signed)
? ? ?  Chronic Care Management ?Pharmacy Assistant  ? ?Name: [name]  MRN: [id number] DOB: [birth year] ? ?Reason for Encounter: Follow up medication change on 10/12/2021. ? ?Patient was to decrease pantoprazole to every other day and take Tums as needed on the off days if she is having symptoms.   ? ?Patient is still having GERD, she was advised per Jeni Salles clinical pharmacist to continue with every other day and we will follow up in one month. ?  ?Patient notified will mail the pap for Restasis. ? ? ?Gennie Alma CMA  ?Clinical Pharmacist Assistant ?[phone] ? ?

## 2021-11-01 NOTE — Telephone Encounter (Signed)
Spoke with pt advised to pick p Pacard in the office , pt state that she is on her way to pick up form, form left in the front office cabinet for pt. ?

## 2021-11-08 ENCOUNTER — Telehealth: Payer: Self-pay | Admitting: Pharmacist

## 2021-11-08 NOTE — Chronic Care Management (AMB) (Signed)
? ? ?Chronic Care Management ?Pharmacy Assistant  ? ?Name: [name]  MRN: [id number] DOB: 06/28/1946 ? ?Reason for Encounter: Disease State / Hypertension Assessment Call ?  ?Conditions to be addressed/monitored: ?HTN ? ?Recent office visits:  ?None ? ?Recent consult visits:  ?09/25/2021 [name] (neurosurgery) - Patient was seen for S/P lumbar fusion. No medication changes.  ?Frontal and lateral images of the thoracolumbar spine were acquired Follow up in 3 months.  ? ? ?Hospital visits:  ?None ? ?Medications: ?Outpatient Encounter Medications as of 11/08/2021  ?Medication Sig  ? ACCU-CHEK GUIDE test strip 1 each by Other route in the morning and at bedtime.  ? aspirin EC 81 MG tablet Take 1 tablet (81 mg total) by mouth daily. Swallow whole.  ? chlorthalidone (HYGROTON) 25 MG tablet Take 1 tablet (25 mg total) by mouth daily.  ? ergocalciferol (VITAMIN D2) 50000 UNITS capsule Take 50,000 Units by mouth every Saturday. On Saturday.  ? FARXIGA 10 MG TABS tablet Take 10 mg by mouth every morning.  ? gabapentin (NEURONTIN) 300 MG capsule Take 300 mg by mouth as needed.  ? HUMALOG 100 UNIT/ML injection   ? hydrALAZINE (APRESOLINE) 100 MG tablet Take 1 tablet (100 mg total) by mouth 3 (three) times daily.  ? Insulin Disposable Pump (V-GO 40) KIT Inject 1 application into the skin See admin instructions.  ? irbesartan (AVAPRO) 300 MG tablet Take 1 tablet (300 mg total) by mouth daily.  ? isosorbide mononitrate (IMDUR) 60 MG 24 hr tablet TAKE 1 & 1/2 (ONE & ONE-HALF) TABLETS BY MOUTH ONCE DAILY  ? latanoprost (XALATAN) 0.005 % ophthalmic solution Place 1 drop into both eyes at bedtime.   ? metoprolol succinate (TOPROL-XL) 100 MG 24 hr tablet Take 1 tablet (100 mg total) by mouth daily.  ? montelukast (SINGULAIR) 10 MG tablet Take 10 mg by mouth daily.  ? nitroGLYCERIN (NITROSTAT) 0.4 MG SL tablet Place 1 tablet (0.4 mg total) under the tongue every 5 (five) minutes as needed for chest pain. (Patient  not taking: Reported on 09/13/2021)  ? OZEMPIC, 1 MG/DOSE, 4 MG/3ML SOPN Inject 1 mg into the skin once a week.  ? pantoprazole (PROTONIX) 40 MG tablet Take 1 tablet by mouth once daily  ? Probiotic Product (PROBIOTIC ADVANCED PO) Take 1 capsule by mouth daily.  ? rosuvastatin (CRESTOR) 40 MG tablet Take 1 tablet (40 mg total) by mouth daily.  ? spironolactone (ALDACTONE) 50 MG tablet Take 1 tablet (50 mg total) by mouth daily.  ? ?No facility-administered encounter medications on file as of 11/08/2021.  ?Fill History: ?FARXIGA 10MG TAB 11/18/2020 90  ? ?Vitamin D2 1,250 mcg (50,000 unit) capsule 10/19/2021 42  ? ?GABAPENTIN 300MG    CAP 07/11/2021 30  ? ?HUMALOG 100 UNIT/ML VIAL 03/27/2021 90  ? ?IRBESARTAN 300MG     TAB 06/21/2021 90  ? ?ISOSORB MONO ER 60MG TAB 08/18/2021 90  ? ?LATANOPROST .005% OP SOL 10/18/2021 68  ? ?metoprolol succinate ER 100 mg tablet,extended release 24 hr 10/11/2021 51  ? ?MONTELUKAST 10MG TAB 06/26/2021 90  ? ?PANTOPRAZOLE 40MG TAB 08/18/2021 90  ? ?rosuvastatin 40 mg tablet 10/02/2021 51  ? ?OZEMPIC 4MG/3ML 1MG/DOSE PEN 03/04/2021 28  ? ?SPIRONOLACTONE 50MG    TAB 08/31/2021 90  ? ?hydralazine 100 mg tablet 10/18/2021 36  ? ?Reviewed chart prior to disease state call. Spoke with patient regarding BP ? ?Recent Office Vitals: ?BP Readings from Last 3 Encounters:  ?05/30/21 108/60  ?04/25/21 128/70  ?04/24/21 122/60  ? ?  Pulse Readings from Last 3 Encounters:  ?05/30/21 66  ?04/24/21 70  ?03/23/21 63  ?  ?Wt Readings from Last 3 Encounters:  ?05/30/21 163 lb 12.8 oz (74.3 kg)  ?04/24/21 177 lb 4.8 oz (80.4 kg)  ?03/23/21 176 lb 8 oz (80.1 kg)  ?  ? ?Kidney Function ?Lab Results  ?Component Value Date/Time  ? CREATININE 0.92 03/23/2021 11:41 AM  ? CREATININE 0.91 03/23/2021 11:37 AM  ? CREATININE 0.92 03/09/2020 10:43 AM  ? CREATININE 0.9 02/21/2016 09:22 AM  ? CREATININE 1.1 08/24/2015 12:20 PM  ? GFR 68.46 02/20/2019 10:22 AM  ? GFRNONAA >60 03/23/2021 09:45 AM  ? GFRAA 59 (L) 03/23/2020  10:02 AM  ? ? ? ?  Latest Ref Rng & Units 03/23/2021  ? 11:41 AM 03/23/2021  ? 11:37 AM 03/23/2021  ?  9:45 AM  ?BMP  ?Glucose 65 - 99 mg/dL 37   37   44    ?BUN 8 - 27 mg/dL 14   14   14     ?Creatinine 0.57 - 1.00 mg/dL 0.92   0.91   0.87    ?BUN/Creat Ratio 12 - 28 15   15      ?Sodium 134 - 144 mmol/L 146   149   144    ?Potassium 3.5 - 5.2 mmol/L 3.9   3.9   3.7    ?Chloride 96 - 106 mmol/L 105   106   109    ?CO2 20 - 29 mmol/L 26   26   27     ?Calcium 8.7 - 10.3 mg/dL 9.6   10.0   9.3    ? ? ?Current antihypertensive regimen:  ?Chlorthalidone 25 mg daily ?Hydralazine 100 mg three times daily ?Irbesartan 300 mg daily ?Isosorbide 60 mg 1.5 tablets daily ?Metoprolol 100 mg daily ?Spironolactone 50 mg daily ? ?How often are you checking your Blood Pressure? Patient states she is checking blood pressures 1-2 times daily. ? ?Current home BP readings: Patient states she is not keeping track of her readings but they are always between 130/70 and 140/80. ? ?What recent interventions/DTPs have been made by any provider to improve Blood Pressure control since last CPP Visit: Advised to follow a low sodium diet.  ? ?Any recent hospitalizations or ED visits since last visit with CPP? No recent hospital visits.  ? ?What diet changes have been made to improve Blood Pressure Control?  ?Patient tries to make healthy choices ?Breakfast - patient will have toast, oatmeal or eggs ?Lunch - patient will have a meat and 2 vegetables ?Dinner - patient will have leftover lunch ? ?What exercise is being done to improve your Blood Pressure Control?  ?She is doing physical therapy exercises.  ? ?Adherence Review: ?Is the patient currently on ACE/ARB medication? Yes ?Does the patient have >5 day gap between last estimated fill dates? No ? ?Care Gaps: ?AWV - previous message sent to [name] ?Last BP - 127/61 on 09/25/2021 ?Last A1C - 6.0 on 05/03/2021 ([facility]) ?Foot exam - never done ?Hep C Screen - never done ?HGA1C - overdue ?  ?Star  Rating Drugs: ?Farxiga 10 mg - [facility] with [name] ?Irbesartan 300 mg  - last filled [facility] 11/09/21 ?Rosuvastatin 40 mg - last filled 10/02/2021 90 DS at [facility] ?Ozempic 29m/3ml - [facility] ? ?JGennie AlmaCMA  ?Clinical Pharmacist Assistant ?[phone]? ?

## 2021-11-09 ENCOUNTER — Telehealth: Payer: Self-pay

## 2021-11-09 NOTE — Telephone Encounter (Signed)
Pt called confused about if she needs a mammogram done or MRI and this LPN explained she needs both. The mammogram is to be scheduled with [facility] and the MRI with [facility] imaging. The pt already has a MRI appt on may 24th. She is aware. The pt is going to call [facility] to get a mammogram scheduled before the MRI sometime. She knows to call the office if she has anymore questions or concerns.  ?

## 2021-11-13 DIAGNOSIS — I2510 Atherosclerotic heart disease of native coronary artery without angina pectoris: Secondary | ICD-10-CM | POA: Diagnosis not present

## 2021-11-13 DIAGNOSIS — Z853 Personal history of malignant neoplasm of breast: Secondary | ICD-10-CM | POA: Diagnosis not present

## 2021-11-13 DIAGNOSIS — F331 Major depressive disorder, recurrent, moderate: Secondary | ICD-10-CM | POA: Diagnosis not present

## 2021-11-13 DIAGNOSIS — M48061 Spinal stenosis, lumbar region without neurogenic claudication: Secondary | ICD-10-CM | POA: Diagnosis not present

## 2021-11-13 DIAGNOSIS — E785 Hyperlipidemia, unspecified: Secondary | ICD-10-CM | POA: Diagnosis not present

## 2021-11-13 DIAGNOSIS — I129 Hypertensive chronic kidney disease with stage 1 through stage 4 chronic kidney disease, or unspecified chronic kidney disease: Secondary | ICD-10-CM | POA: Diagnosis not present

## 2021-11-13 DIAGNOSIS — D649 Anemia, unspecified: Secondary | ICD-10-CM | POA: Diagnosis not present

## 2021-11-13 DIAGNOSIS — E1159 Type 2 diabetes mellitus with other circulatory complications: Secondary | ICD-10-CM | POA: Diagnosis not present

## 2021-11-13 DIAGNOSIS — N1831 Chronic kidney disease, stage 3a: Secondary | ICD-10-CM | POA: Diagnosis not present

## 2021-11-13 DIAGNOSIS — E559 Vitamin D deficiency, unspecified: Secondary | ICD-10-CM | POA: Diagnosis not present

## 2021-11-13 DIAGNOSIS — I6523 Occlusion and stenosis of bilateral carotid arteries: Secondary | ICD-10-CM | POA: Diagnosis not present

## 2021-11-13 DIAGNOSIS — M8580 Other specified disorders of bone density and structure, unspecified site: Secondary | ICD-10-CM | POA: Diagnosis not present

## 2021-11-14 DIAGNOSIS — M545 Low back pain, unspecified: Secondary | ICD-10-CM | POA: Diagnosis not present

## 2021-11-14 DIAGNOSIS — M6281 Muscle weakness (generalized): Secondary | ICD-10-CM | POA: Diagnosis not present

## 2021-11-14 DIAGNOSIS — M25551 Pain in right hip: Secondary | ICD-10-CM | POA: Diagnosis not present

## 2021-11-15 ENCOUNTER — Ambulatory Visit: Payer: Medicare Other | Admitting: Physician Assistant

## 2021-11-15 DIAGNOSIS — R922 Inconclusive mammogram: Secondary | ICD-10-CM | POA: Diagnosis not present

## 2021-11-15 LAB — HM MAMMOGRAPHY

## 2021-11-16 ENCOUNTER — Telehealth: Payer: Self-pay | Admitting: Pharmacist

## 2021-11-16 ENCOUNTER — Telehealth: Payer: Self-pay | Admitting: Physician Assistant

## 2021-11-16 DIAGNOSIS — M170 Bilateral primary osteoarthritis of knee: Secondary | ICD-10-CM | POA: Diagnosis not present

## 2021-11-16 NOTE — Telephone Encounter (Signed)
Called [facility] back to inform them that we sent the refill on 09/21/21 with a refill. I advised if they have any other problems, questions or concerns, to give our office a call back. Pharmacy tech verbalized understanding.  ?

## 2021-11-16 NOTE — Chronic Care Management (AMB) (Signed)
? ? ?Chronic Care Management ?Pharmacy Assistant  ? ?Name: [name]  MRN: [id number] DOB: 06-19-1946 ? ?Reason for Encounter: Medication Review / Medication Coordination Call ?  ?Conditions to be addressed/monitored: ?HTN ? ?Recent office visits:  ?None ? ?Recent consult visits:  ?None ? ?Hospital visits:  ?None ? ?Medications: ?Outpatient Encounter Medications as of 11/16/2021  ?Medication Sig  ? ACCU-CHEK GUIDE test strip 1 each by Other route in the morning and at bedtime.  ? aspirin EC 81 MG tablet Take 1 tablet (81 mg total) by mouth daily. Swallow whole.  ? chlorthalidone (HYGROTON) 25 MG tablet Take 1 tablet (25 mg total) by mouth daily.  ? ergocalciferol (VITAMIN D2) 50000 UNITS capsule Take 50,000 Units by mouth every Saturday. On Saturday.  ? FARXIGA 10 MG TABS tablet Take 10 mg by mouth every morning.  ? gabapentin (NEURONTIN) 300 MG capsule Take 300 mg by mouth as needed.  ? HUMALOG 100 UNIT/ML injection   ? hydrALAZINE (APRESOLINE) 100 MG tablet Take 1 tablet (100 mg total) by mouth 3 (three) times daily.  ? Insulin Disposable Pump (V-GO 40) KIT Inject 1 application into the skin See admin instructions.  ? irbesartan (AVAPRO) 300 MG tablet Take 1 tablet (300 mg total) by mouth daily.  ? isosorbide mononitrate (IMDUR) 60 MG 24 hr tablet TAKE 1 & 1/2 (ONE & ONE-HALF) TABLETS BY MOUTH ONCE DAILY  ? latanoprost (XALATAN) 0.005 % ophthalmic solution Place 1 drop into both eyes at bedtime.   ? metoprolol succinate (TOPROL-XL) 100 MG 24 hr tablet Take 1 tablet (100 mg total) by mouth daily.  ? montelukast (SINGULAIR) 10 MG tablet Take 10 mg by mouth daily.  ? nitroGLYCERIN (NITROSTAT) 0.4 MG SL tablet Place 1 tablet (0.4 mg total) under the tongue every 5 (five) minutes as needed for chest pain. (Patient not taking: Reported on 09/13/2021)  ? OZEMPIC, 1 MG/DOSE, 4 MG/3ML SOPN Inject 1 mg into the skin once a week.  ? pantoprazole (PROTONIX) 40 MG tablet Take 1 tablet by mouth once daily  ? Probiotic  Product (PROBIOTIC ADVANCED PO) Take 1 capsule by mouth daily.  ? rosuvastatin (CRESTOR) 40 MG tablet Take 1 tablet (40 mg total) by mouth daily.  ? spironolactone (ALDACTONE) 50 MG tablet Take 1 tablet (50 mg total) by mouth daily.  ? ?No facility-administered encounter medications on file as of 11/16/2021.  ? ?Reviewed chart for medication changes ahead of medication coordination call. ? ?No OVs, Consults, or hospital visits since last care coordination call/Pharmacist visit. (If appropriate, list visit date, provider name) ? ?No medication changes indicated OR if recent visit, treatment plan here. ? ?BP Readings from Last 3 Encounters:  ?05/30/21 108/60  ?04/25/21 128/70  ?04/24/21 122/60  ?  ?Lab Results  ?Component Value Date  ? HGBA1C 15.2 (H) 02/20/2019  ?  ? ?Patient obtains medications through Adherence Packaging  30 Days  ? ?Last adherence delivery included: Onboard 09/21/2021 ? ?Patient declined  last month: New onboard ? ?Patient is due for next adherence delivery on: 11/28/2021 ? ?Called patient and reviewed medications and coordinated delivery. ? ?This delivery to include: ?Vitamin D2 50,000 units once weekly ?Spironolactone 50 mg 1 tablet at breakfast ?Rosuvastatin 40 mg 1 tablet at breakfast ?Isosorbide ER 60 mg 1.5 tablets daily ?Montelukast 10 mg 1 tablet at bedtime ?Pantoprazole 40 mg 1 tablet at breakfast ?Metoprolol 100 mg 1 tablet at bedtime ?Chlorthalidone 25 mg 1 tablet at breakfast ?Hydralazine 100 mg 1 tablet at breakfast, lunch  and bedtime ?Irbesartan 300 mg 1 tablet at bedtime ? ?Patient will need a short fill:  ? ?Coordinated acute fill: ? ?Patient declined the following medications:  ? ?New scripts requested from: ?Dr. Forde Dandy for Montelukast left message with Caryl Pina ?Richardson Dopp PA-C for Rosuvastatin spoke with Juliann Pulse, she states they sent an rx to [facility] on 09/21/2021. ? ?Unable to reach patient to review order and delivery date of 11/28/2021 ? ?Care Gaps: ?AWV - message sent to [name] on 11/16/21 ?Last BP - 127/61 on 09/25/2021 ?Last A1C - 6.0 on 05/03/2021 ([facility]) ?Foot exam - never done ?Hep C Screen - never done ?HGA1C - overdue ?Shingrix - overdue ?  ?Star Rating Drugs: ?Farxiga 10 mg - last filled 11/18/2020 [facility]  ?Irbesartan 300 mg  - last filled 11/03/2021 21 DS at [facility] ?Rosuvastatin 40 mg - last sent 11/12/2021 90 DS at [facility] verified with pharm tech at [facility], this was not picked up, rx canceled at [facility].  ?Ozempic 38m/3ml - [facility] ?JGennie AlmaCMA  ?Clinical Pharmacist Assistant ?[phone]? ?

## 2021-11-16 NOTE — Telephone Encounter (Signed)
?*  STAT* If patient is at the pharmacy, call can be transferred to refill team. ? ? ?1. Which medications need to be refilled? (please list name of each medication and dose if known) new prescription for Rosuvastatin ? ?2. Which pharmacy/location (including street and city if local pharmacy) is medication to be sent to? [facility] ? ?3. Do they need a 30 day or 90 day supply? 30 days with refills ? ? ?Please change her new pharmacy to [facility] ? ?

## 2021-11-21 DIAGNOSIS — M25551 Pain in right hip: Secondary | ICD-10-CM | POA: Diagnosis not present

## 2021-11-21 DIAGNOSIS — M545 Low back pain, unspecified: Secondary | ICD-10-CM | POA: Diagnosis not present

## 2021-11-21 DIAGNOSIS — M6281 Muscle weakness (generalized): Secondary | ICD-10-CM | POA: Diagnosis not present

## 2021-11-23 ENCOUNTER — Other Ambulatory Visit: Payer: Self-pay

## 2021-11-23 DIAGNOSIS — K219 Gastro-esophageal reflux disease without esophagitis: Secondary | ICD-10-CM

## 2021-11-23 MED ORDER — PANTOPRAZOLE SODIUM 40 MG PO TBEC: 40.0000 mg | DELAYED_RELEASE_TABLET | Freq: Every day | ORAL | 0 refills | Status: DC

## 2021-11-28 DIAGNOSIS — M6281 Muscle weakness (generalized): Secondary | ICD-10-CM | POA: Diagnosis not present

## 2021-11-28 DIAGNOSIS — M545 Low back pain, unspecified: Secondary | ICD-10-CM | POA: Diagnosis not present

## 2021-11-28 DIAGNOSIS — M25551 Pain in right hip: Secondary | ICD-10-CM | POA: Diagnosis not present

## 2021-11-29 ENCOUNTER — Ambulatory Visit
Admission: RE | Admit: 2021-11-29 | Discharge: 2021-11-29 | Disposition: A | Discharge status: Home / Self Care | Payer: Medicare Other | Source: Ambulatory Visit | Attending: Hematology | Admitting: Hematology

## 2021-11-29 DIAGNOSIS — Z853 Personal history of malignant neoplasm of breast: Secondary | ICD-10-CM | POA: Diagnosis not present

## 2021-11-29 DIAGNOSIS — D0512 Intraductal carcinoma in situ of left breast: Secondary | ICD-10-CM

## 2021-11-29 MED ORDER — GADOBUTROL 1 MMOL/ML IV SOLN
10.0000 mL | Freq: Once | INTRAVENOUS | Status: AC | PRN
  Administered 2021-11-29: 10 mL via INTRAVENOUS

## 2021-12-05 DIAGNOSIS — M6281 Muscle weakness (generalized): Secondary | ICD-10-CM | POA: Diagnosis not present

## 2021-12-05 DIAGNOSIS — M25551 Pain in right hip: Secondary | ICD-10-CM | POA: Diagnosis not present

## 2021-12-05 DIAGNOSIS — M545 Low back pain, unspecified: Secondary | ICD-10-CM | POA: Diagnosis not present

## 2021-12-06 ENCOUNTER — Telehealth: Payer: Self-pay | Admitting: Pharmacist

## 2021-12-06 NOTE — Chronic Care Management (AMB) (Signed)
    Chronic Care Management Pharmacy Assistant   Name: [name]  MRN: [id number] DOB: 05-May-1946  Reason for Encounter: Follow up medication change on 10/12/2021  On 10/31/2021 Spoke with patient about decreasing pantoprazole to every other day and take Tums as needed on the off days if she is having symptoms.    Patient was still having GERD, she was advised per Jeni Salles clinical pharmacist to continue with every other day and we will follow up in one month.  Unable to reach patient after several attempts to follow up with GERD.    [place] Pharmacist Assistant [phone]

## 2021-12-08 ENCOUNTER — Encounter: Payer: Self-pay | Admitting: Family Medicine

## 2021-12-12 DIAGNOSIS — M25551 Pain in right hip: Secondary | ICD-10-CM | POA: Diagnosis not present

## 2021-12-12 DIAGNOSIS — M6281 Muscle weakness (generalized): Secondary | ICD-10-CM | POA: Diagnosis not present

## 2021-12-12 DIAGNOSIS — M545 Low back pain, unspecified: Secondary | ICD-10-CM | POA: Diagnosis not present

## 2021-12-15 ENCOUNTER — Telehealth: Payer: Self-pay | Admitting: Pharmacist

## 2021-12-15 IMAGING — MR MR BREAST BX W LOC DEV 1ST LESION IMAGE BX SPEC MR GUIDE*L*
7 of 10 series · 31 of 48 positions shown · IV contrast (8ml Gadavist)
Comparison: Previous exams.
COMPARISON: Previous exams.

Addendum:
CLINICAL DATA: 74-year-old female presenting for MRI guided biopsy
of the bilateral breasts.

EXAM:
MRI GUIDED CORE NEEDLE BIOPSY OF THE BILATERAL BREAST
TECHNIQUE: Multiplanar, multisequence MR imaging of the bilateral breast was
performed both before and after administration of intravenous
contrast.
CONTRAST:  8mL GADAVIST GADOBUTROL 1 MMOL/ML IV SOLN

[Series 3: fiducial bilateral · sagittal · 2.0mm · 1.33mm/px · 4 of 144 slices shown]
[im 1/144]
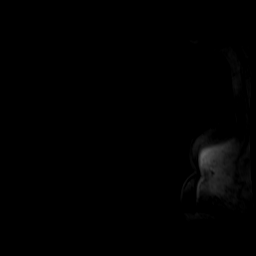
[im 48/144]
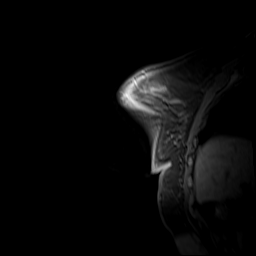
[im 96/144]
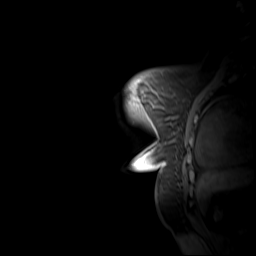
[im 144/144]
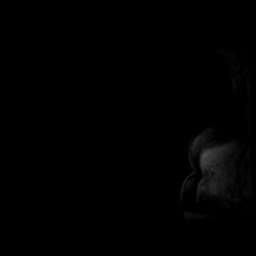

[Series 4: dynamic pre · axial · non-contrast · 1.3mm · 0.78mm/px · z∈[-55,+131]mm · 4 of 144 slices shown]
[im 1/144]
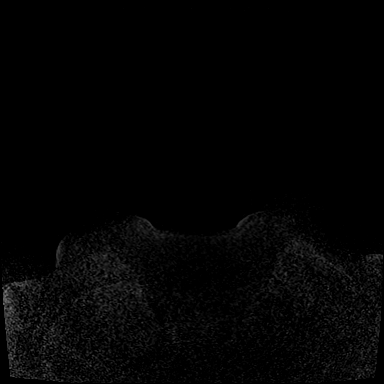
[im 48/144]
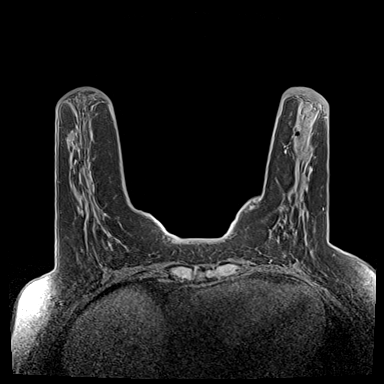
[im 96/144]
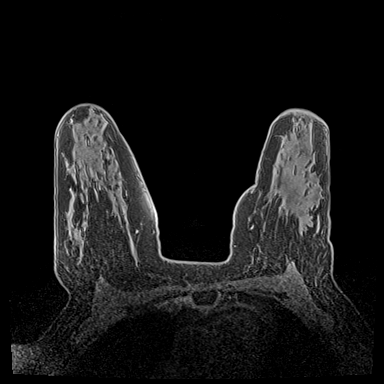
[im 144/144]
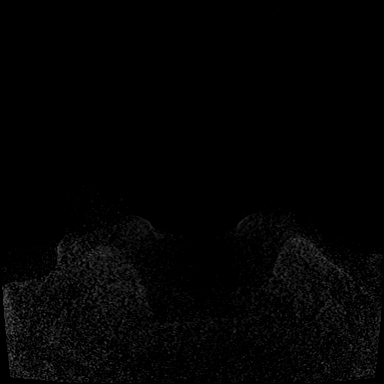

[Series 5: dynamic post 20 · axial · 1.3mm · 0.78mm/px · z∈[-55,+131]mm · 5 of 144 slices shown (1 of 2)]
[im 1/144]
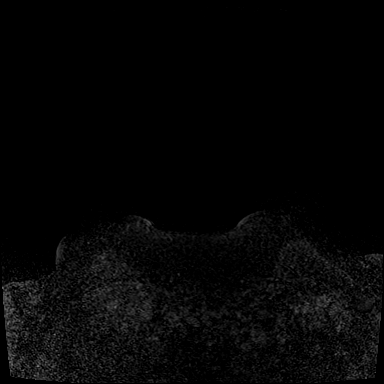
[im 36/144]
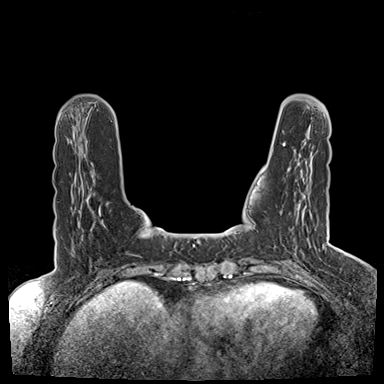
[im 72/144]
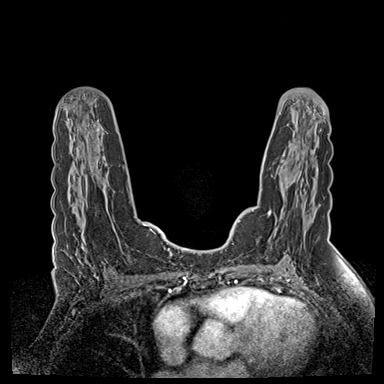
[im 108/144]
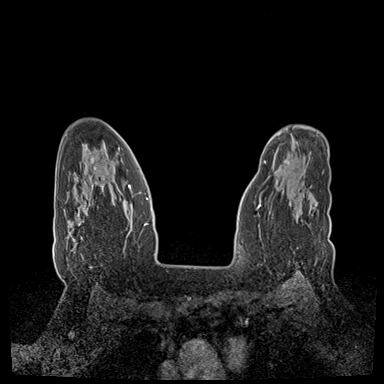
[im 144/144]
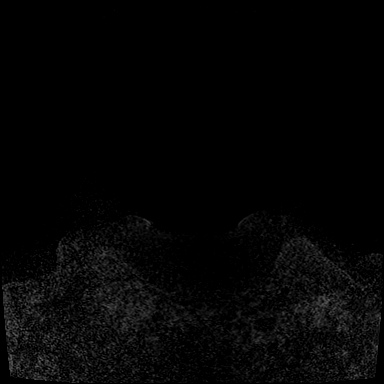

[Series 6: dynamic post 20 · axial · 1.3mm · 0.78mm/px · z∈[-55,+131]mm · 5 of 144 slices shown (2 of 2)]
[im 1/144]
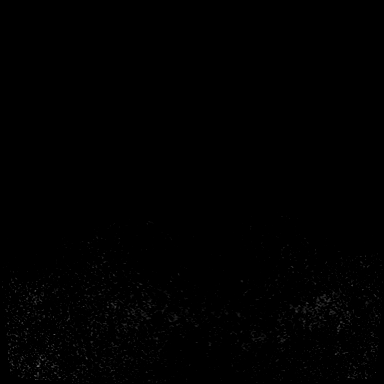
[im 36/144]
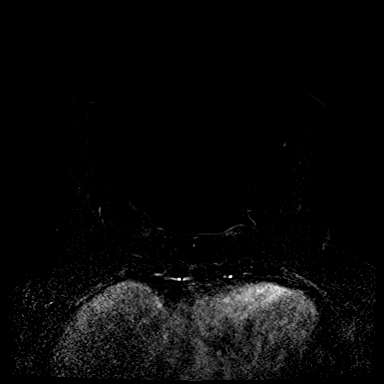
[im 72/144]
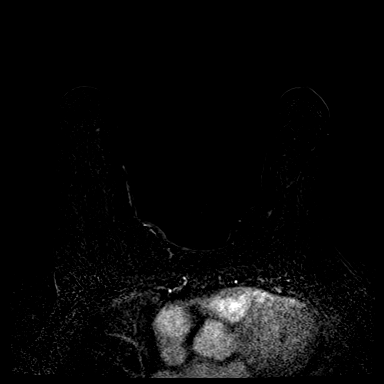
[im 108/144]
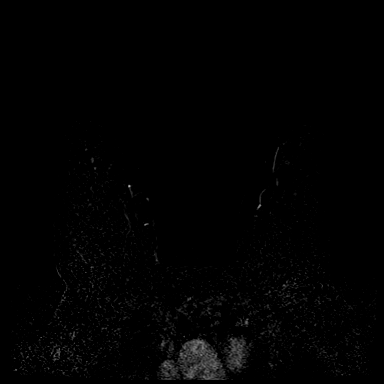
[im 144/144]
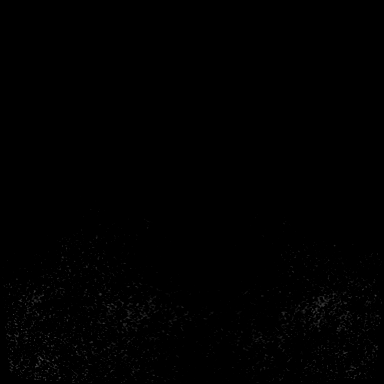

[Series 7: dynamic post 3 · axial · 1.3mm · 0.78mm/px · z∈[-55,+131]mm · 5 of 144 slices shown (1 of 2)]
[im 1/144]
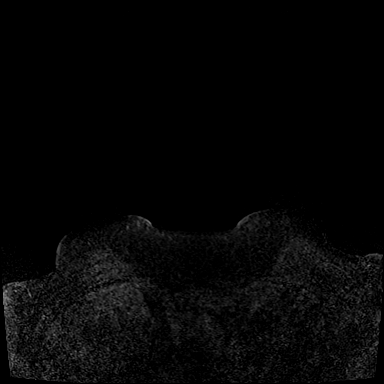
[im 36/144]
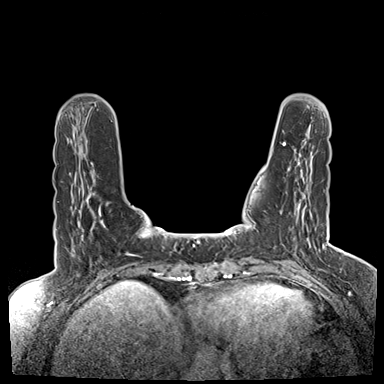
[im 72/144]
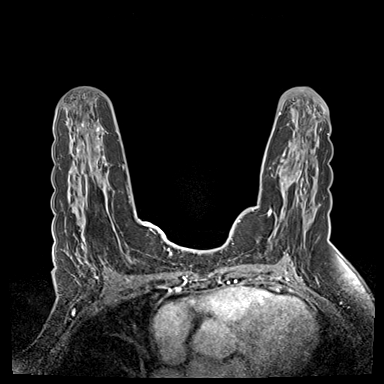
[im 108/144]
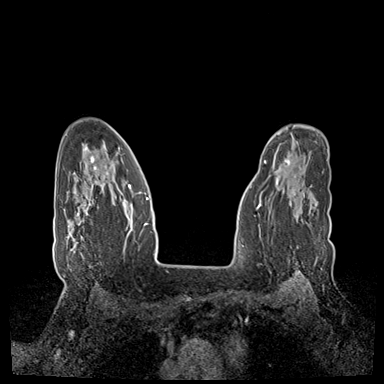
[im 144/144]
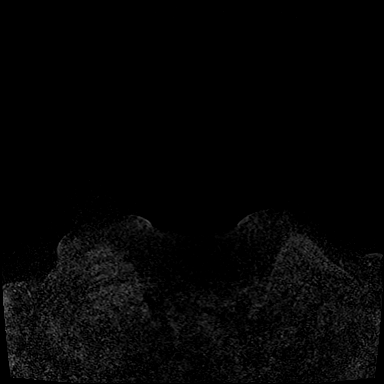

[Series 8: dynamic post 3 · axial · 1.3mm · 0.78mm/px · z∈[-55,+131]mm · 5 of 144 slices shown (2 of 2)]
[im 1/144]
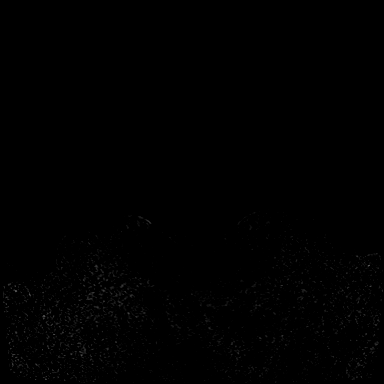
[im 36/144]
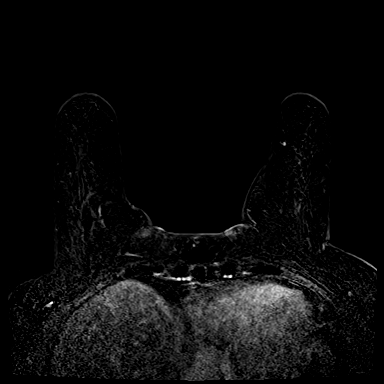
[im 72/144]
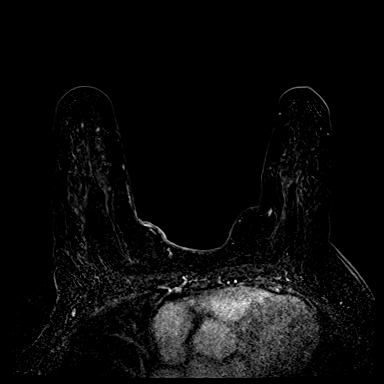
[im 108/144]
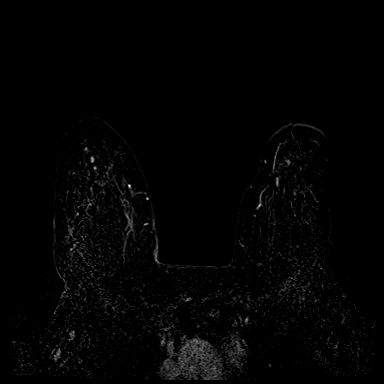
[im 144/144]
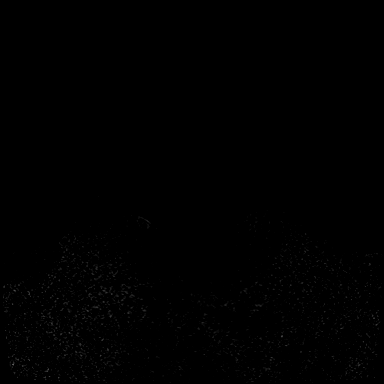

[Series 9: dynamic post 5 · axial · 1.3mm · 0.78mm/px · z∈[-55,+38]mm · 3 of 144 slices shown]
[im 1/144]
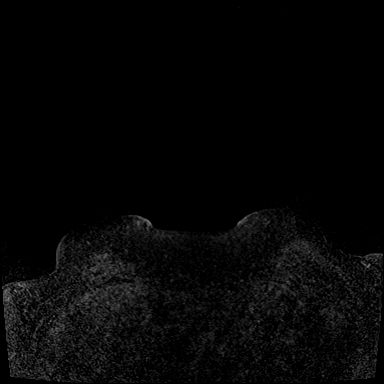
[im 36/144]
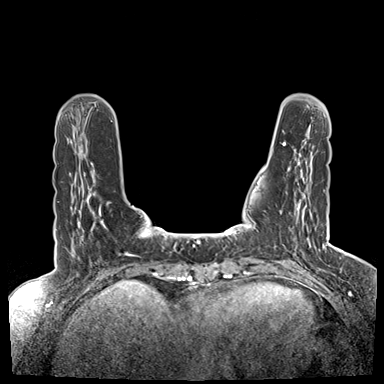
[im 72/144]
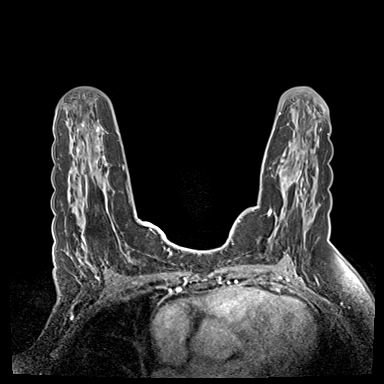

[31 of 48 positions shown; findings below may reference images not displayed]

FINDINGS: I met with the patient, and we discussed the procedure of MRI guided
biopsy, including risks, benefits, and alternatives. Specifically,
we discussed the risks of infection, bleeding, tissue injury, clip
migration, and inadequate sampling. Informed, written consent was
given. The usual time out protocol was performed immediately prior
to the procedure.

#1 Using sterile technique, 1% Lidocaine, MRI guidance, and a 9
gauge vacuum assisted device, biopsy was performed of linear non
mass enhancement in the upper inner right breast using a lateral
approach. At the conclusion of the procedure, a dumbbell tissue
marker clip was deployed into the biopsy cavity.

#2 Using sterile technique, 1% Lidocaine, MRI guidance, and a 9
gauge vacuum assisted device, biopsy was performed of a mass in the
lateral right breast using a lateral approach. At the conclusion of
the procedure, a cylinder tissue marker clip was deployed into the
biopsy cavity.

#3 Using sterile technique, 1% Lidocaine, MRI guidance, and a 9
gauge vacuum assisted device, biopsy was performed of a mass in the
lateral left breast using a lateral approach. At the conclusion of
the procedure, a dumbbell tissue marker clip was deployed into the
biopsy cavity.

Follow-up 2-view mammogram was performed and dictated separately.
IMPRESSION: 1. MRI guided biopsy of linear non mass enhancement in the upper
inner right breast. No apparent complications.

2. MRI guided biopsy of an enhancing mass in the lateral right
breast. No apparent complications.

3. MRI guided biopsy of an enhancing mass in the lateral left
breast. No apparent complications.

ADDENDUM:
Pathology revealed FIBROCYSTIC CHANGES of the RIGHT breast, upper
inner. This was found to be concordant by Dr. Relindas Auad.

Pathology revealed FIBROCYSTIC CHANGES WITH FOCAL ADENOSIS of the
RIGHT breast, lateral. This was found to be concordant by Dr.
Relindas Auad.

Pathology revealed INTERMEDIATE GRADE DUCTAL CARCINOMA IN SITU of
the LEFT breast, lateral. This was found to be concordant by Dr.
Relindas Auad.

Pathology results were discussed with the patient by telephone. The
patient reported doing well after the biopsies with tenderness at
the sites. Post biopsy instructions and care were reviewed and
questions were answered. The patient was encouraged to call The
direct phone number was provided for the patient.

Surgical consultation has been arranged with Dr. Celesthe Mls at
[REDACTED] on December 21, 2019.

The patient is scheduled to see Dr. Carlota Reiner at [HOSPITAL] [REDACTED] on December 22, 2019.

The patient was instructed to return for a BILATERAL breast MRI in 6
months, per protocol.

Pathology results reported by Arcenho Kentura, RN on 12/21/2019.

*** End of Addendum ***
FINDINGS: I met with the patient, and we discussed the procedure of MRI guided
biopsy, including risks, benefits, and alternatives. Specifically,
we discussed the risks of infection, bleeding, tissue injury, clip
migration, and inadequate sampling. Informed, written consent was
given. The usual time out protocol was performed immediately prior
to the procedure.

#1 Using sterile technique, 1% Lidocaine, MRI guidance, and a 9
gauge vacuum assisted device, biopsy was performed of linear non
mass enhancement in the upper inner right breast using a lateral
approach. At the conclusion of the procedure, a dumbbell tissue
marker clip was deployed into the biopsy cavity.

#2 Using sterile technique, 1% Lidocaine, MRI guidance, and a 9
gauge vacuum assisted device, biopsy was performed of a mass in the
lateral right breast using a lateral approach. At the conclusion of
the procedure, a cylinder tissue marker clip was deployed into the
biopsy cavity.

#3 Using sterile technique, 1% Lidocaine, MRI guidance, and a 9
gauge vacuum assisted device, biopsy was performed of a mass in the
lateral left breast using a lateral approach. At the conclusion of
the procedure, a dumbbell tissue marker clip was deployed into the
biopsy cavity.

Follow-up 2-view mammogram was performed and dictated separately.
IMPRESSION: 1. MRI guided biopsy of linear non mass enhancement in the upper
inner right breast. No apparent complications.

2. MRI guided biopsy of an enhancing mass in the lateral right
breast. No apparent complications.

3. MRI guided biopsy of an enhancing mass in the lateral left
breast. No apparent complications.

## 2021-12-15 IMAGING — MG MM BREAST LOCALIZATION CLIP
4 series · 4 of 12 positions shown · non-contrast
Comparison: Previous exam(s).

CLINICAL DATA: Bilateral post biopsy mammogram for clip placement.

EXAM:
DIAGNOSTIC BILATERAL MAMMOGRAM POST MRI BIOPSY

[R CC synth-2D]
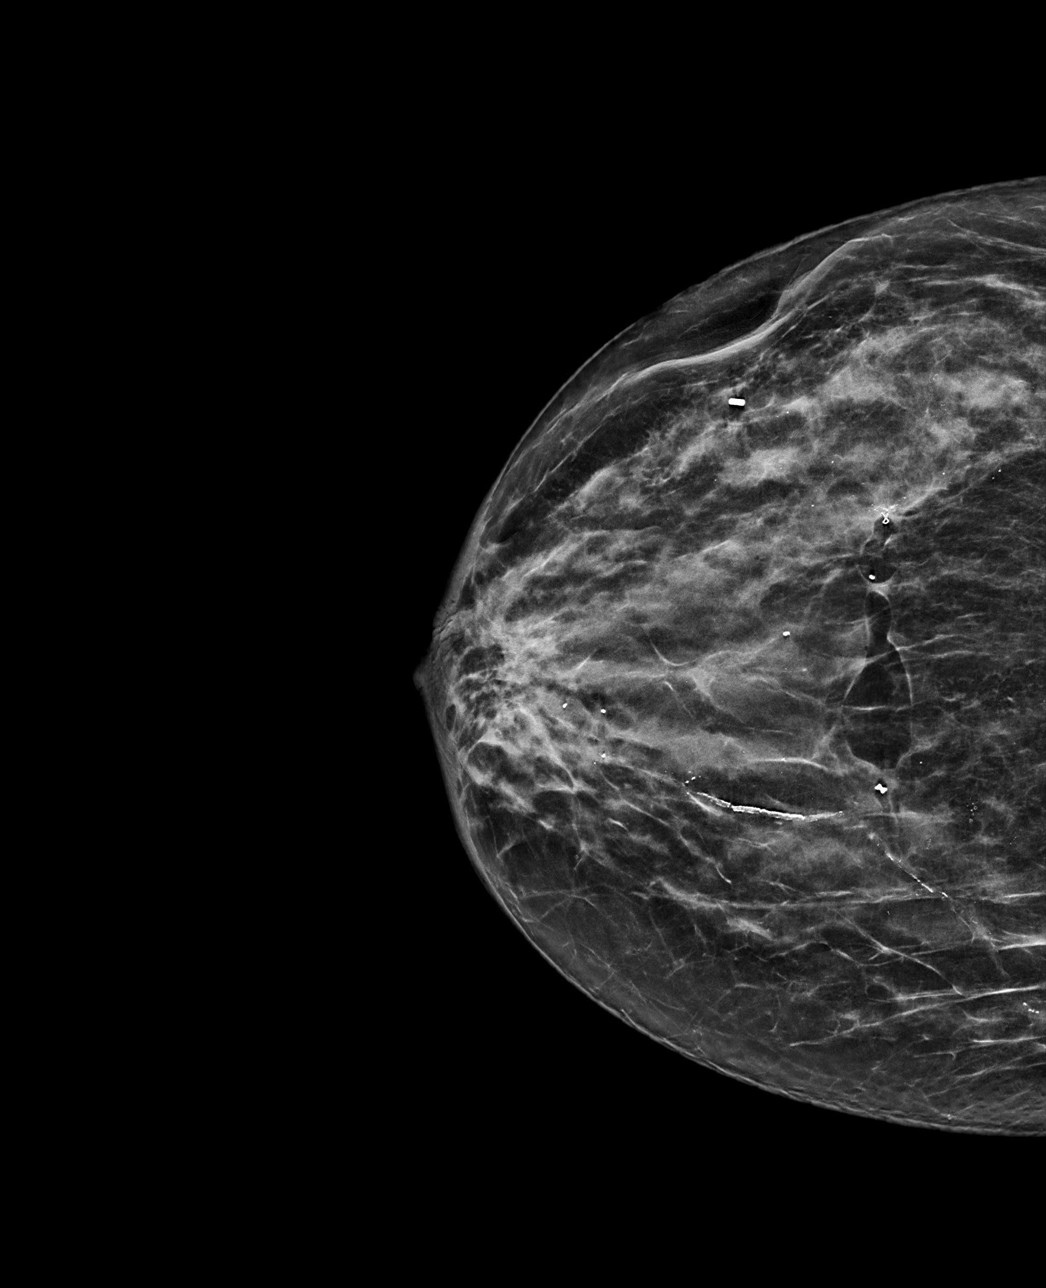

[R ML synth-2D]
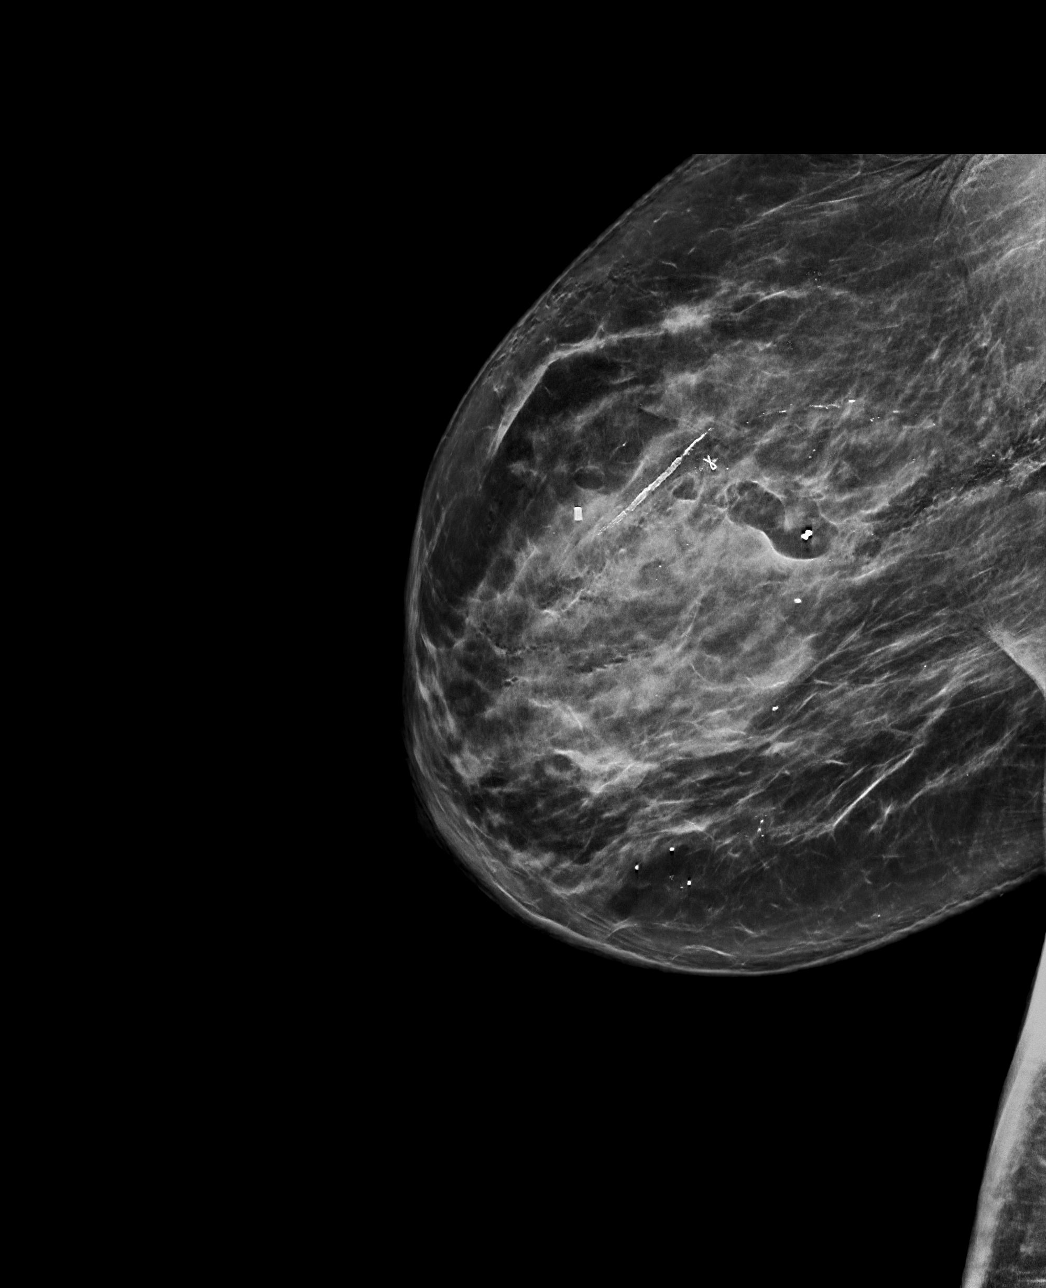

[R CC tomo · tomo slice 32/63.0]
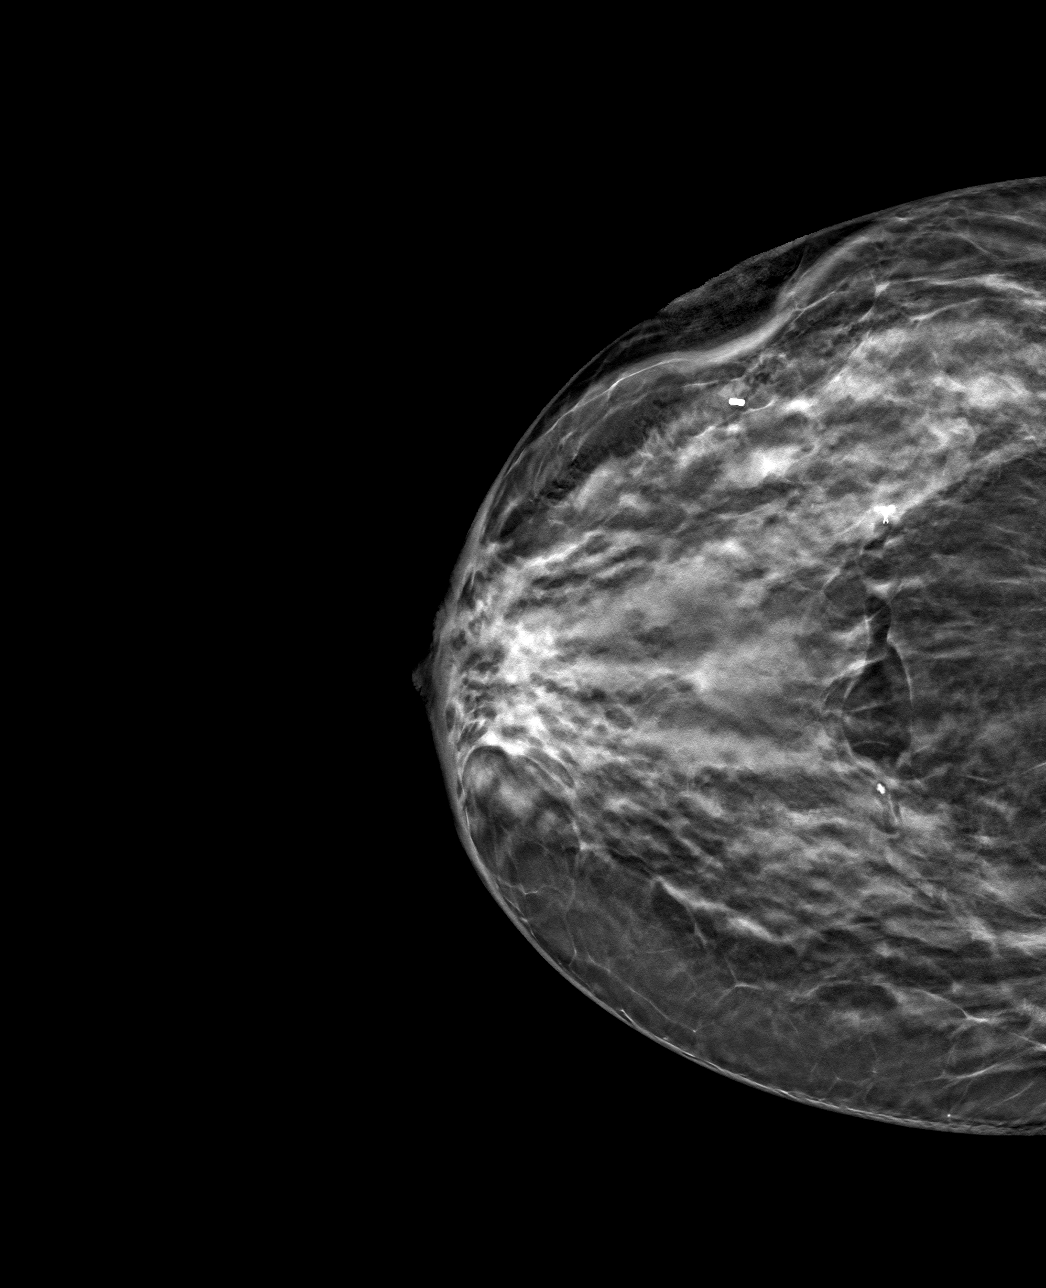

[R ML tomo · tomo slice 42/83.0]
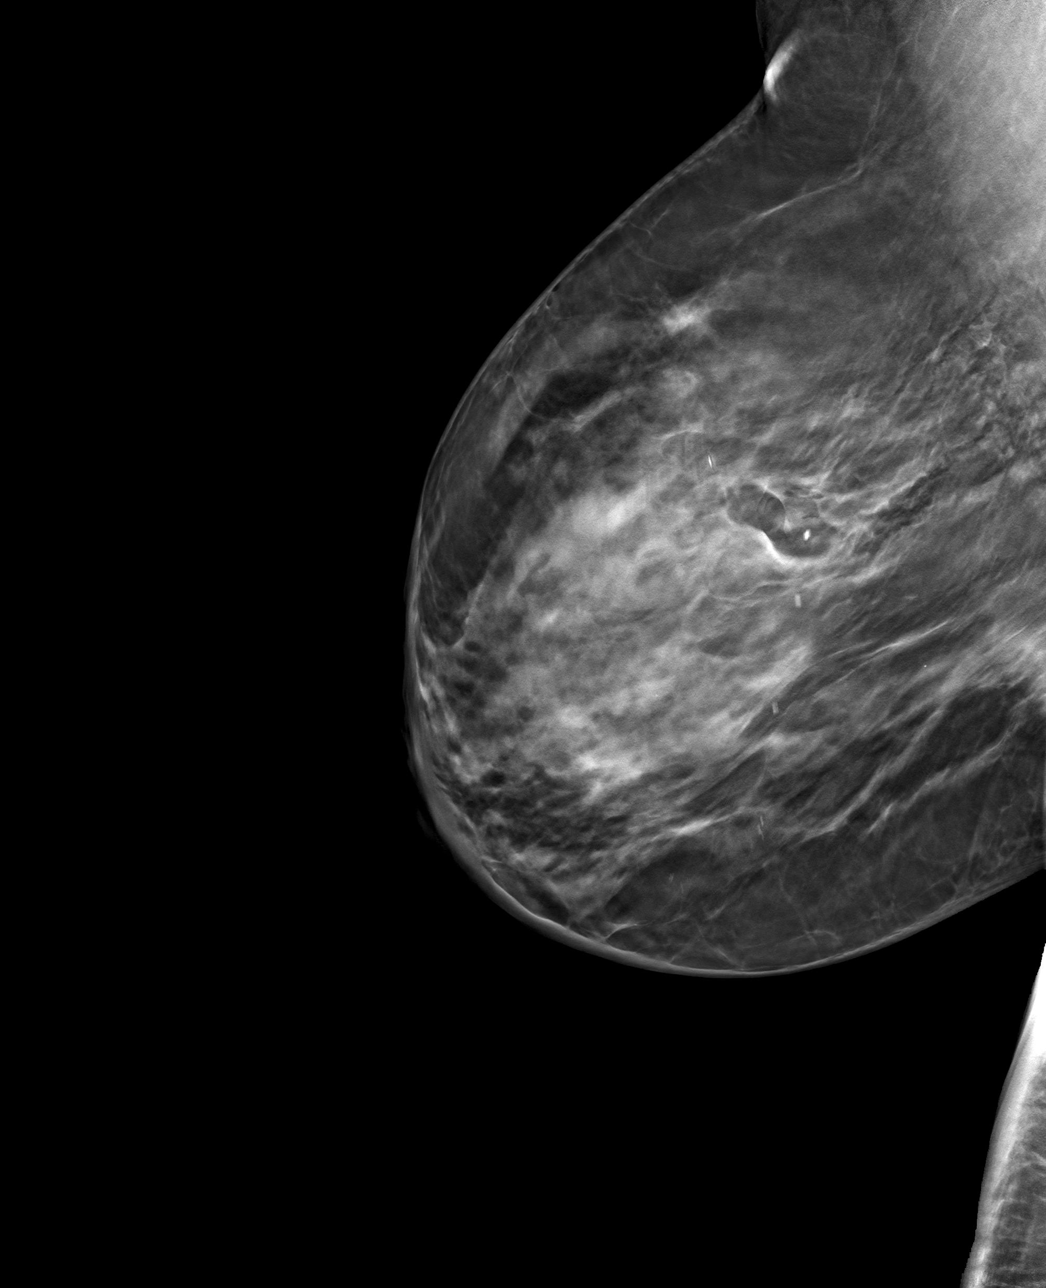

[4 of 12 positions shown; findings below may reference images not displayed]

FINDINGS: Mammographic images were obtained following MRI guided biopsy of the
bilateral breasts. The biopsy marking clips are in expected position
at the site of biopsy.
IMPRESSION: 1. Appropriate positioning of the dumbbell shaped biopsy marking
clip at the site of biopsy in the upper inner right breast.

2. Appropriate positioning of the cylinder shaped biopsy marking
clip at the site of biopsy in the upper-outer right breast.

3. Appropriate positioning of the dumbbell shaped biopsy marking
clip at the site of biopsy in the upper-outer left breast at the
patient's lumpectomy site.

Final Assessment: Post Procedure Mammograms for Marker Placement

## 2021-12-15 NOTE — Chronic Care Management (AMB) (Unsigned)
Chronic Care Management Pharmacy Assistant   Name: [name]  MRN: [id number] DOB: 03-02-1946  Reason for Encounter: Medication Review / Medication Coordination Call   Conditions to be addressed/monitored: HTN  Recent office visits:  None  Recent consult visits:  None  Hospital visits:  None  Medications: Outpatient Encounter Medications as of 12/15/2021  Medication Sig   ACCU-CHEK GUIDE test strip 1 each by Other route in the morning and at bedtime.   aspirin EC 81 MG tablet Take 1 tablet (81 mg total) by mouth daily. Swallow whole.   chlorthalidone (HYGROTON) 25 MG tablet Take 1 tablet (25 mg total) by mouth daily.   ergocalciferol (VITAMIN D2) 50000 UNITS capsule Take 50,000 Units by mouth every Saturday. On Saturday.   FARXIGA 10 MG TABS tablet Take 10 mg by mouth every morning.   gabapentin (NEURONTIN) 300 MG capsule Take 300 mg by mouth as needed.   HUMALOG 100 UNIT/ML injection    hydrALAZINE (APRESOLINE) 100 MG tablet Take 1 tablet (100 mg total) by mouth 3 (three) times daily.   Insulin Disposable Pump (V-GO 40) KIT Inject 1 application into the skin See admin instructions.   irbesartan (AVAPRO) 300 MG tablet Take 1 tablet (300 mg total) by mouth daily.   isosorbide mononitrate (IMDUR) 60 MG 24 hr tablet TAKE 1 & 1/2 (ONE & ONE-HALF) TABLETS BY MOUTH ONCE DAILY   latanoprost (XALATAN) 0.005 % ophthalmic solution Place 1 drop into both eyes at bedtime.    metoprolol succinate (TOPROL-XL) 100 MG 24 hr tablet Take 1 tablet (100 mg total) by mouth daily.   montelukast (SINGULAIR) 10 MG tablet Take 10 mg by mouth daily.   nitroGLYCERIN (NITROSTAT) 0.4 MG SL tablet Place 1 tablet (0.4 mg total) under the tongue every 5 (five) minutes as needed for chest pain. (Patient not taking: Reported on 09/13/2021)   OZEMPIC, 1 MG/DOSE, 4 MG/3ML SOPN Inject 1 mg into the skin once a week.   pantoprazole (PROTONIX) 40 MG tablet Take 1 tablet (40 mg total) by mouth daily.    Probiotic Product (PROBIOTIC ADVANCED PO) Take 1 capsule by mouth daily.   rosuvastatin (CRESTOR) 40 MG tablet Take 1 tablet (40 mg total) by mouth daily.   spironolactone (ALDACTONE) 50 MG tablet Take 1 tablet (50 mg total) by mouth daily.   No facility-administered encounter medications on file as of 12/15/2021.   Reviewed chart for medication changes ahead of medication coordination call.  No OVs, Consults, or hospital visits since last care coordination call/Pharmacist visit. (If appropriate, list visit date, provider name)  No medication changes indicated OR if recent visit, treatment plan here.  BP Readings from Last 3 Encounters:  05/30/21 108/60  04/25/21 128/70  04/24/21 122/60    Lab Results  Component Value Date   HGBA1C 15.2 (H) 02/20/2019     Patient obtains medications through Adherence Packaging  30 Days    Last adherence delivery included:  Vitamin D2 50,000 units once weekly Spironolactone 50 mg 1 tablet at breakfast Rosuvastatin 40 mg 1 tablet at breakfast Isosorbide ER 60 mg 1.5 tablets daily Montelukast 10 mg 1 tablet at bedtime Pantoprazole 40 mg 1 tablet at breakfast Metoprolol 100 mg 1 tablet at bedtime Chlorthalidone 25 mg 1 tablet at breakfast Hydralazine 100 mg 1 tablet at breakfast, lunch and bedtime Irbesartan 300 mg 1 tablet at bedtime   Patient declined  last month:    Patient is due for next adherence delivery on: 12/27/2021  Called patient and reviewed medications and coordinated delivery.   This delivery to include: Vitamin D2 50,000 units once weekly on Sat. Spironolactone 50 mg 1 tablet at breakfast Rosuvastatin 40 mg 1 tablet at breakfast Isosorbide ER 60 mg 1.5 tablets daily Montelukast 10 mg 1 tablet at bedtime Pantoprazole 40 mg 1 tablet at breakfast Metoprolol 100 mg 1 tablet at bedtime Chlorthalidone 25 mg 1 tablet at breakfast Hydralazine 100 mg 1 tablet at breakfast, lunch and bedtime Irbesartan 300 mg 1 tablet at bedtime    Patient will need a short fill:    Coordinated acute fill:   Patient declined the following medications:    Unable to reach patient to review order and delivery date of 12/27/2021  Care Gaps: AWV - previous message sent to [name] Last BP - 108/60 on 05/30/2021 Last A1C - 6.0 on 05/03/2021 Foot exam - never done Hep C Screen - never done [facility] - overdue Shingrix - overdue  Star Rating Drugs: Farxiga 10 mg -  last filled [facility]/18/2020 [facility] verified with Jake Irbesartan 300 mg  - last filled 11/22/2021 30 DS at [facility] 44m/3ml - [facility] Rosuvastatin 40 mg - [facility]/22[facility]2021 [facility] [phone]

## 2021-12-19 DIAGNOSIS — M6281 Muscle weakness (generalized): Secondary | ICD-10-CM | POA: Diagnosis not present

## 2021-12-19 DIAGNOSIS — M545 Low back pain, unspecified: Secondary | ICD-10-CM | POA: Diagnosis not present

## 2021-12-19 DIAGNOSIS — M25551 Pain in right hip: Secondary | ICD-10-CM | POA: Diagnosis not present

## 2021-12-25 ENCOUNTER — Telehealth: Payer: Self-pay | Admitting: Family Medicine

## 2021-12-25 DIAGNOSIS — I5032 Chronic diastolic (congestive) heart failure: Secondary | ICD-10-CM

## 2021-12-25 DIAGNOSIS — I10 Essential (primary) hypertension: Secondary | ICD-10-CM

## 2021-12-25 NOTE — Telephone Encounter (Signed)
Done

## 2021-12-26 DIAGNOSIS — M170 Bilateral primary osteoarthritis of knee: Secondary | ICD-10-CM | POA: Diagnosis not present

## 2022-01-02 DIAGNOSIS — M25551 Pain in right hip: Secondary | ICD-10-CM | POA: Diagnosis not present

## 2022-01-02 DIAGNOSIS — M545 Low back pain, unspecified: Secondary | ICD-10-CM | POA: Diagnosis not present

## 2022-01-02 DIAGNOSIS — M6281 Muscle weakness (generalized): Secondary | ICD-10-CM | POA: Diagnosis not present

## 2022-01-04 DIAGNOSIS — M170 Bilateral primary osteoarthritis of knee: Secondary | ICD-10-CM | POA: Diagnosis not present

## 2022-01-11 DIAGNOSIS — M170 Bilateral primary osteoarthritis of knee: Secondary | ICD-10-CM | POA: Diagnosis not present

## 2022-01-16 ENCOUNTER — Telehealth: Payer: Self-pay | Admitting: Pharmacist

## 2022-01-16 NOTE — Progress Notes (Signed)
Cardiology Office Note:    Date:  01/17/2022   ID:  [name], DOB 05-13-1946, MRN [id number]  PCP:  Laurey Morale, MD  [facility] Providers Cardiologist:  Sherren Mocha, MD Cardiology APP:  Liliane Shi, PA-C  Hypertension Clinic: Skeet Latch, MD    Referring MD: Laurey Morale, MD   Chief Complaint:  F/u for CAD, CHF    Patient Profile: Coronary artery disease  [place] in 2010 >> PCI: DES to RCA Cath 12/2014: mod non-obs dz in LAD, RCA stent patent (HFpEF) heart failure with preserved ejection fraction  Echo 6/16: EF 60-65, Gr 2 DD Diabetes mellitus 2 Hypertension  Intol of Carvedilol (swelling) Hyperlipidemia  S/p Prior TIA MRI 10/21: "remote lacunar infarcts in R caudate and b/l corona radiata" Carotid artery dz S/p R CEA GERD Lumbar spine disease S/p prior fusion Breast CA (DCIS) S/p L lumpectomy  Prior CV Studies: Carotid US 03/21/2021 Bilateral ICA 1-39   LHC 12/29/14 LM:  Ostial 30 LAD: prox 50, dist 50; D1 60 LCx:  lum irregs RCA:  prox 30, mid stent ok Med Rx   Echo 12/30/14 EF 60% to 65%. Wall motion was normal; Grade 2 diastolic dysfunction    Myoview 12/20/14 Possible small defect in the inferolateral wall. Ischemia cannot be completely ruled out but there is significant diaphragmatic and soft tissue attenuation on RAW images noted. This is a low risk study. EF55-65%    Renal Art [place] 10/29/11 Normal renal arteries bilaterally  History of Present Illness:   [name] is a 75 y.o. female with the above problem list.  She was last seen in March 2022. She has also seen Dr. Oval Linsey in the HTN clinic. She was last seen in Oct 2022. She returns for f/u.  She is here alone.  She has been doing well without chest pain, shortness of breath, syncope, orthopnea, leg edema.        Past Medical History:  Diagnosis Date   Abnormal nuclear stress test 12/29/2014   Allergy    Anemia, unspecified    Arthritis    Blood  transfusion without reported diagnosis    2018   CAD (coronary artery disease)    sees. Dr. Burt Knack. s/p cath with PCI of RCA (xience des) June 2010. Pt also with LAD and D2 dzs-NL FFR in both areas med Rx   Carotid artery occlusion    Cataract    sees Dr. Herbert Deaner    Depressive disorder, not elsewhere classified    Duodenal nodule    Edema    Fibromyalgia    Gastric polyps    COLON   GERD (gastroesophageal reflux disease)    Glaucoma    narrow angle, sees Dr. Shelly Flatten)    Hearing loss    left ear   Heart murmur    questionable   Hemorrhoids    HSV (herpes simplex virus) anogenital infection 05/2014   HTN (hypertension)    Hyperlipidemia    Low back pain    she sees Dr. Louanne Skye, also Dr. Ernestina Patches for pain medications and Benjiman Core PA for injections    Obesity    Osteopenia 01/2018   T score -1.6 FRAX 7.1% / 1.4%   Peripheral vascular disease ([place])    Resistant hypertension 02/21/2007   Qualifier: Diagnosis of  By: Tiney Rouge CMA, Ellison Hughs     Stroke [facility])    TIA  "many yrs ago"     TIA (transient ischemic  attack)    also Hx of it.    Type II or unspecified type diabetes mellitus without mention of complication, not stated as uncontrolled    sees Dr. Carrolyn Meiers    Current Medications: Current Meds  Medication Sig   ACCU-CHEK GUIDE test strip 1 each by Other route in the morning and at bedtime.   aspirin EC 81 MG tablet Take 1 tablet (81 mg total) by mouth daily. Swallow whole.   chlorthalidone (HYGROTON) 25 MG tablet Take 1 tablet (25 mg total) by mouth daily.   ergocalciferol (VITAMIN D2) 50000 UNITS capsule Take 50,000 Units by mouth every Saturday. On Saturday.   FARXIGA 10 MG TABS tablet Take 10 mg by mouth every morning.   gabapentin (NEURONTIN) 300 MG capsule Take 300 mg by mouth as needed.   HUMALOG 100 UNIT/ML injection    hydrALAZINE (APRESOLINE) 100 MG tablet Take 1 tablet (100 mg total) by mouth 3 (three) times daily.   Insulin Disposable Pump (V-GO  40) KIT Inject 1 application into the skin See admin instructions.   irbesartan (AVAPRO) 300 MG tablet Take 1 tablet (300 mg total) by mouth daily.   isosorbide mononitrate (IMDUR) 60 MG 24 hr tablet TAKE 1 & 1/2 (ONE & ONE-HALF) TABLETS BY MOUTH ONCE DAILY   latanoprost (XALATAN) 0.005 % ophthalmic solution Place 1 drop into both eyes at bedtime.    metoprolol succinate (TOPROL-XL) 100 MG 24 hr tablet Take 1 tablet (100 mg total) by mouth daily.   montelukast (SINGULAIR) 10 MG tablet Take 10 mg by mouth daily.   nitroGLYCERIN (NITROSTAT) 0.4 MG SL tablet Place 1 tablet (0.4 mg total) under the tongue every 5 (five) minutes as needed for chest pain.   OZEMPIC, 1 MG/DOSE, 4 MG/3ML SOPN Inject 1 mg into the skin once a week.   pantoprazole (PROTONIX) 40 MG tablet Take 1 tablet (40 mg total) by mouth daily.   Probiotic Product (PROBIOTIC ADVANCED PO) Take 1 capsule by mouth daily.   rosuvastatin (CRESTOR) 40 MG tablet Take 1 tablet (40 mg total) by mouth daily.   spironolactone (ALDACTONE) 50 MG tablet Take 1 tablet (50 mg total) by mouth daily.    Allergies:   Dulaglutide   Social History   Tobacco Use   Smoking status: Former    Types: Cigarettes    Quit date: 01/22/1967    Years since quitting: 55.0   Smokeless tobacco: Never  Vaping Use   Vaping Use: Never used  Substance Use Topics   Alcohol use: No    Alcohol/week: 0.0 standard drinks of alcohol   Drug use: No    Family Hx: [name] family history includes Breast cancer (age of onset: 58) in her cousin; Breast cancer (age of onset: 43) in her maternal aunt; Breast cancer (age of onset: 9) in her cousin; Breast cancer (age of onset: 31) in her cousin; Cancer in her brother, father, and maternal aunt; Diabetes in her brother; Esophageal cancer in her cousin; Heart disease in her brother and maternal grandfather; Hyperlipidemia in her sister; Hypertension in her brother, mother, and sister; Prostate cancer (age of onset: 4) in  her cousin; Stomach cancer (age of onset: 52) in her maternal grandmother; Varicose Veins in her brother. There is no history of Coronary artery disease, Colon cancer, Colon polyps, or Rectal cancer.  Review of Systems  Gastrointestinal:  Negative for hematochezia.  Genitourinary:  Negative for hematuria.     EKGs/Labs/Other Test Reviewed:    EKG:  EKG is not ordered today.  The ekg ordered today demonstrates n/a  Recent Labs: 03/23/2021: ALT 18; BUN 14; Creatinine, Ser 0.92; Hemoglobin 11.7; Platelets 209; Potassium 3.9; Sodium 146   Recent Lipid Panel Recent Labs    03/23/21 1141  CHOL 113  TRIG 63  HDL 49  LDLCALC 50    Labs obtained through [place] - personally reviewed and interpreted: 11/13/2021: Total cholesterol 114, HDL 37, LDL 64, triglycerides 64, ALT 13  Risk Assessment/Calculations/Metrics:              Physical Exam:    VS:  BP (!) 100/40   Pulse 66   Ht 5' 3"  (1.6 m)   Wt 172 lb (78 kg)   SpO2 97%   BMI 30.47 kg/m     Wt Readings from Last 3 Encounters:  01/17/22 172 lb (78 kg)  05/30/21 163 lb 12.8 oz (74.3 kg)  04/24/21 177 lb 4.8 oz (80.4 kg)    Constitutional:      Appearance: Healthy appearance. Not in distress.  Neck:     Vascular: JVD normal.  Pulmonary:     Effort: Pulmonary effort is normal.     Breath sounds: No wheezing. No rales.  Cardiovascular:     Normal rate. Regular rhythm. Normal S1. Normal S2.      Murmurs: There is no murmur.  Edema:    Peripheral edema absent.  Abdominal:     Palpations: Abdomen is soft.  Skin:    General: Skin is warm and dry.  Neurological:     Mental Status: Alert and oriented to person, place and time.         ASSESSMENT & PLAN:   CAD (coronary artery disease) Hx of DES to the RCA in 2010. Cath in 2016 demonstrated patent RCA stent and non-obs disease elsewhere.  She is doing well without angina.  Continue aspirin 81 mg daily, isosorbide mononitrate 90 mg daily, Toprol XL 100 mg daily, Crestor  40 mg daily.  Follow-up with Dr. Burt Knack or me in 1 year.  (HFpEF) heart failure with preserved ejection fraction (HCC) Echocardiogram in 2016 with EF 45-62 and mod diastolic dysfunction.  Volume status is stable.  Continue dapagliflozin 10 mg daily, chlorthalidone 25 mg daily, spironolactone 50 mg daily.  Resistant hypertension She has been managed by Dr. Oval Linsey in the advanced HTN clinic.  Blood pressure has been well controlled recently.  Today her blood pressure is somewhat low.  On repeat by me, her blood pressure is 100/60.  She has not had any symptoms.  Before changing her medications, I would like to track her blood pressure for the next week or 2.  If her blood pressure continues to run low, we can decrease her dose of chlorthalidone to 12.5 mg daily.  She prefers to continue to follow with Dr. Oval Linsey.  Arrange follow-up with Dr. Oval Linsey in 6 months.  Hyperlipidemia LDL goal <70 LDL in May 2023 optimal.  Continue Crestor 40 mg daily.          Dispo:  Return in about 6 months (around 07/20/2022) for Routine Follow Up with Dr. Oval Linsey and Dr. Burt Knack or Richardson Dopp, PA-C in 12 mos..   Medication Adjustments/Labs and Tests Ordered: Current medicines are reviewed at length with the patient today.  Concerns regarding medicines are outlined above.  Tests Ordered: No orders of the defined types were placed in this encounter.  Medication Changes: No orders of the defined types were placed in this encounter.  Signed, Richardson Dopp, PA-C  01/17/2022 11:24 AM    [facility] [address], [place], [place]  [address] Phone: [phone]; Fax: [phone]

## 2022-01-16 NOTE — Chronic Care Management (AMB) (Signed)
Chronic Care Management Pharmacy Assistant   Name: [name]  MRN: [id number] DOB: 01-19-1946  Reason for Encounter: Medication Review / Medication Coordination Call  Recent office visits:  None  Recent consult visits:  None  Hospital visits:  None  Medications: Outpatient Encounter Medications as of 01/16/2022  Medication Sig   ACCU-CHEK GUIDE test strip 1 each by Other route in the morning and at bedtime.   aspirin EC 81 MG tablet Take 1 tablet (81 mg total) by mouth daily. Swallow whole.   chlorthalidone (HYGROTON) 25 MG tablet Take 1 tablet (25 mg total) by mouth daily.   ergocalciferol (VITAMIN D2) 50000 UNITS capsule Take 50,000 Units by mouth every Saturday. On Saturday.   FARXIGA 10 MG TABS tablet Take 10 mg by mouth every morning.   gabapentin (NEURONTIN) 300 MG capsule Take 300 mg by mouth as needed.   HUMALOG 100 UNIT/ML injection    hydrALAZINE (APRESOLINE) 100 MG tablet Take 1 tablet (100 mg total) by mouth 3 (three) times daily.   Insulin Disposable Pump (V-GO 40) KIT Inject 1 application into the skin See admin instructions.   irbesartan (AVAPRO) 300 MG tablet Take 1 tablet (300 mg total) by mouth daily.   isosorbide mononitrate (IMDUR) 60 MG 24 hr tablet TAKE 1 & 1/2 (ONE & ONE-HALF) TABLETS BY MOUTH ONCE DAILY   latanoprost (XALATAN) 0.005 % ophthalmic solution Place 1 drop into both eyes at bedtime.    metoprolol succinate (TOPROL-XL) 100 MG 24 hr tablet Take 1 tablet (100 mg total) by mouth daily.   montelukast (SINGULAIR) 10 MG tablet Take 10 mg by mouth daily.   nitroGLYCERIN (NITROSTAT) 0.4 MG SL tablet Place 1 tablet (0.4 mg total) under the tongue every 5 (five) minutes as needed for chest pain. (Patient not taking: Reported on 09/13/2021)   OZEMPIC, 1 MG/DOSE, 4 MG/3ML SOPN Inject 1 mg into the skin once a week.   pantoprazole (PROTONIX) 40 MG tablet Take 1 tablet (40 mg total) by mouth daily.   Probiotic Product (PROBIOTIC ADVANCED PO) Take 1  capsule by mouth daily.   rosuvastatin (CRESTOR) 40 MG tablet Take 1 tablet (40 mg total) by mouth daily.   spironolactone (ALDACTONE) 50 MG tablet Take 1 tablet (50 mg total) by mouth daily.   No facility-administered encounter medications on file as of 01/16/2022.  Reviewed chart for medication changes ahead of medication coordination call.  No OVs, Consults, or hospital visits since last care coordination call/Pharmacist visit. (If appropriate, list visit date, provider name)  No medication changes indicated OR if recent visit, treatment plan here.  BP Readings from Last 3 Encounters:  05/30/21 108/60  04/25/21 128/70  04/24/21 122/60    Lab Results  Component Value Date   HGBA1C 15.2 (H) 02/20/2019     Patient obtains medications through Adherence Packaging  30 Days    Last adherence delivery included:  Vitamin D2 50,000 units once weekly on Sat. Spironolactone 50 mg 1 tablet at breakfast Rosuvastatin 40 mg 1 tablet at breakfast Isosorbide ER 60 mg 1.5 tablets daily Montelukast 10 mg 1 tablet at bedtime Pantoprazole 40 mg 1 tablet at breakfast Metoprolol 100 mg 1 tablet at bedtime Chlorthalidone 25 mg 1 tablet at breakfast Hydralazine 100 mg 1 tablet at breakfast, lunch and bedtime Irbesartan 300 mg 1 tablet at bedtime   Patient declined  last month:    Patient is due for next adherence delivery on: 01/26/2022   Called patient and reviewed medications and  coordinated delivery.   This delivery to include: Vitamin D2 50,000 units once weekly at breakfast on Sat. Spironolactone 50 mg 1 tablet at breakfast Rosuvastatin 40 mg 1 tablet at breakfast Isosorbide ER 60 mg 1.5 tablets at breakfast Montelukast 10 mg 1 tablet at bedtime Pantoprazole 40 mg 1 tablet daily Metoprolol 100 mg 1 tablet at bedtime Chlorthalidone 25 mg 1 tablet at breakfast Hydralazine 100 mg 1 tablet at breakfast, lunch and bedtime Irbesartan 300 mg 1 tablet at bedtime V-go 40 use as directed    Patient will need a short fill:    Coordinated acute fill:   Patient declined the following medications:    Unable to reach patient to review order and delivery date of 01/26/2022   Care Gaps: AWV - previous message sent to [name] Last BP - 108/60 on 05/30/2021 Last A1C - 6.0 on 05/03/2021 Foot Exam - never done Hep C Screen - never done HGA1C - overdue Shingrix - overdue  Star Rating Drugs: Rosuvastatin 40 mg - last filled 12/21/2021 30 DS at [facility] Irbesartan 300 mg  - last filled 12/21/2021 30 DS at [place] [place] 78m/3ml - last filled 03/04/2021 [facility] - [facility]/[place] [phone]

## 2022-01-17 ENCOUNTER — Ambulatory Visit (INDEPENDENT_AMBULATORY_CARE_PROVIDER_SITE_OTHER): Payer: Medicare Other | Admitting: Physician Assistant

## 2022-01-17 ENCOUNTER — Encounter: Payer: Self-pay | Admitting: Physician Assistant

## 2022-01-17 VITALS — BP 100/40 | HR 66 | Ht 63.0 in | Wt 172.0 lb

## 2022-01-17 DIAGNOSIS — I10 Essential (primary) hypertension: Secondary | ICD-10-CM | POA: Diagnosis not present

## 2022-01-17 DIAGNOSIS — I2510 Atherosclerotic heart disease of native coronary artery without angina pectoris: Secondary | ICD-10-CM

## 2022-01-17 DIAGNOSIS — E785 Hyperlipidemia, unspecified: Secondary | ICD-10-CM | POA: Diagnosis not present

## 2022-01-17 DIAGNOSIS — I5032 Chronic diastolic (congestive) heart failure: Secondary | ICD-10-CM | POA: Diagnosis not present

## 2022-01-17 NOTE — Patient Instructions (Signed)
Medication Instructions:  Your physician recommends that you continue on your current medications as directed. Please refer to the Current Medication list given to you today.  *If you need a refill on your cardiac medications before your next appointment, please call your pharmacy*   Lab Work: None ordered  If you have labs (blood work) drawn today and your tests are completely normal, you will receive your results only by: [place] (if you have MyChart) OR A paper copy in the mail If you have any lab test that is abnormal or we need to change your treatment, we will call you to review the results.   Testing/Procedures: None ordered   Follow-Up: At [facility], you and your health needs are our priority.  As part of our continuing mission to provide you with exceptional heart care, we have created designated Provider Care Teams.  These Care Teams include your primary Cardiologist (physician) and Advanced Practice Providers (APPs -  Physician Assistants and Nurse Practitioners) who all work together to provide you with the care you need, when you need it.  We recommend signing up for the patient portal called "MyChart".  Sign up information is provided on this After Visit Summary.  MyChart is used to connect with patients for Virtual Visits (Telemedicine).  Patients are able to view lab/test results, encounter notes, upcoming appointments, etc.  Non-urgent messages can be sent to your provider as well.   To learn more about what you can do with MyChart, go to [email or web address].    Your next appointment:   12 month(s)  6 months  The format for your next appointment:   In Person  Provider:   Sherren Mocha, MD  or Richardson Dopp, PA-C      Skeet Latch, MD    Other Instructions Your physician has requested that you regularly monitor and record your blood pressure readings at home. Please use the same machine at the same time of day to check your readings and  record them to bring to your follow-up visit.   Please monitor blood pressures and keep a log of your readings and send in the readings in 1-2 weeks    Make sure to check 2 hours after your medications.    AVOID these things for 30 minutes before checking your blood pressure: No Drinking caffeine. No Drinking alcohol. No Eating. No Smoking. No Exercising.   Five minutes before checking your blood pressure: Pee. Sit in a dining chair. Avoid sitting in a soft couch or armchair. Be quiet. Do not talk   Important Information About Sugar

## 2022-01-17 NOTE — Assessment & Plan Note (Addendum)
She has been managed by Dr. Oval Linsey in the advanced HTN clinic.  Blood pressure has been well controlled recently.  Today her blood pressure is somewhat low.  On repeat by me, her blood pressure is 100/60.  She has not had any symptoms.  Before changing her medications, I would like to track her blood pressure for the next week or 2.  If her blood pressure continues to run low, we can decrease her dose of chlorthalidone to 12.5 mg daily.  She prefers to continue to follow with Dr. Oval Linsey.  Arrange follow-up with Dr. Oval Linsey in 6 months.

## 2022-01-17 NOTE — Assessment & Plan Note (Addendum)
Echocardiogram in 2016 with EF 65-46 and mod diastolic dysfunction.  Volume status is stable.  Continue dapagliflozin 10 mg daily, chlorthalidone 25 mg daily, spironolactone 50 mg daily.

## 2022-01-17 NOTE — Assessment & Plan Note (Addendum)
LDL in May 2023 optimal.  Continue Crestor 40 mg daily.

## 2022-01-17 NOTE — Assessment & Plan Note (Addendum)
Hx of DES to the RCA in 2010. Cath in 2016 demonstrated patent RCA stent and non-obs disease elsewhere.  She is doing well without angina.  Continue aspirin 81 mg daily, isosorbide mononitrate 90 mg daily, Toprol XL 100 mg daily, Crestor 40 mg daily.  Follow-up with Dr. Burt Knack or me in 1 year.

## 2022-01-31 DIAGNOSIS — H9042 Sensorineural hearing loss, unilateral, left ear, with unrestricted hearing on the contralateral side: Secondary | ICD-10-CM | POA: Diagnosis not present

## 2022-01-31 DIAGNOSIS — H838X2 Other specified diseases of left inner ear: Secondary | ICD-10-CM | POA: Diagnosis not present

## 2022-01-31 DIAGNOSIS — D2322 Other benign neoplasm of skin of left ear and external auricular canal: Secondary | ICD-10-CM | POA: Diagnosis not present

## 2022-02-07 ENCOUNTER — Other Ambulatory Visit (INDEPENDENT_AMBULATORY_CARE_PROVIDER_SITE_OTHER): Payer: Self-pay | Admitting: Otolaryngology

## 2022-02-07 DIAGNOSIS — D2322 Other benign neoplasm of skin of left ear and external auricular canal: Secondary | ICD-10-CM | POA: Diagnosis not present

## 2022-02-07 DIAGNOSIS — H61002 Unspecified perichondritis of left external ear: Secondary | ICD-10-CM | POA: Diagnosis not present

## 2022-02-14 DIAGNOSIS — D2271 Melanocytic nevi of right lower limb, including hip: Secondary | ICD-10-CM | POA: Diagnosis not present

## 2022-02-14 DIAGNOSIS — L700 Acne vulgaris: Secondary | ICD-10-CM | POA: Diagnosis not present

## 2022-02-14 DIAGNOSIS — L304 Erythema intertrigo: Secondary | ICD-10-CM | POA: Diagnosis not present

## 2022-02-14 DIAGNOSIS — D2272 Melanocytic nevi of left lower limb, including hip: Secondary | ICD-10-CM | POA: Diagnosis not present

## 2022-02-14 DIAGNOSIS — L853 Xerosis cutis: Secondary | ICD-10-CM | POA: Diagnosis not present

## 2022-02-14 DIAGNOSIS — D225 Melanocytic nevi of trunk: Secondary | ICD-10-CM | POA: Diagnosis not present

## 2022-02-14 DIAGNOSIS — L720 Epidermal cyst: Secondary | ICD-10-CM | POA: Diagnosis not present

## 2022-02-14 DIAGNOSIS — I8392 Asymptomatic varicose veins of left lower extremity: Secondary | ICD-10-CM | POA: Diagnosis not present

## 2022-02-15 ENCOUNTER — Telehealth: Payer: Self-pay | Admitting: Pharmacist

## 2022-02-15 ENCOUNTER — Other Ambulatory Visit: Payer: Self-pay | Admitting: Family Medicine

## 2022-02-15 DIAGNOSIS — K219 Gastro-esophageal reflux disease without esophagitis: Secondary | ICD-10-CM

## 2022-02-15 NOTE — Chronic Care Management (AMB) (Signed)
Chronic Care Management Pharmacy Assistant   Name: [name]  MRN: [id number] DOB: 24-Sep-1945  Reason for Encounter: Medication Review / Medication Coordination Call   Recent office visits:  None  Recent consult visits:  01/17/2022 Richardson Dopp PA-C (cardiology) - Patient was seen for Coronary artery disease involving native coronary artery of native heart without angina pectoris and additional issues. No medication changes. Follow up in 6 months and 12 months.   Hospital visits:  None  Medications: Outpatient Encounter Medications as of 02/15/2022  Medication Sig   ACCU-CHEK GUIDE test strip 1 each by Other route in the morning and at bedtime.   aspirin EC 81 MG tablet Take 1 tablet (81 mg total) by mouth daily. Swallow whole.   chlorthalidone (HYGROTON) 25 MG tablet Take 1 tablet (25 mg total) by mouth daily.   ergocalciferol (VITAMIN D2) 50000 UNITS capsule Take 50,000 Units by mouth every Saturday. On Saturday.   FARXIGA 10 MG TABS tablet Take 10 mg by mouth every morning.   gabapentin (NEURONTIN) 300 MG capsule Take 300 mg by mouth as needed.   HUMALOG 100 UNIT/ML injection    hydrALAZINE (APRESOLINE) 100 MG tablet Take 1 tablet (100 mg total) by mouth 3 (three) times daily.   Insulin Disposable Pump (V-GO 40) KIT Inject 1 application into the skin See admin instructions.   irbesartan (AVAPRO) 300 MG tablet Take 1 tablet (300 mg total) by mouth daily.   isosorbide mononitrate (IMDUR) 60 MG 24 hr tablet TAKE 1 & 1/2 (ONE & ONE-HALF) TABLETS BY MOUTH ONCE DAILY   latanoprost (XALATAN) 0.005 % ophthalmic solution Place 1 drop into both eyes at bedtime.    metoprolol succinate (TOPROL-XL) 100 MG 24 hr tablet Take 1 tablet (100 mg total) by mouth daily.   montelukast (SINGULAIR) 10 MG tablet Take 10 mg by mouth daily.   nitroGLYCERIN (NITROSTAT) 0.4 MG SL tablet Place 1 tablet (0.4 mg total) under the tongue every 5 (five) minutes as needed for chest pain.   OZEMPIC, 1  MG/DOSE, 4 MG/3ML SOPN Inject 1 mg into the skin once a week.   pantoprazole (PROTONIX) 40 MG tablet TAKE ONE TABLET BY MOUTH ONCE daily   Probiotic Product (PROBIOTIC ADVANCED PO) Take 1 capsule by mouth daily.   rosuvastatin (CRESTOR) 40 MG tablet Take 1 tablet (40 mg total) by mouth daily.   spironolactone (ALDACTONE) 50 MG tablet Take 1 tablet (50 mg total) by mouth daily.   No facility-administered encounter medications on file as of 02/15/2022.  Reviewed chart for medication changes ahead of medication coordination call.  No OVs, Consults, or hospital visits since last care coordination call/Pharmacist visit. (If appropriate, list visit date, provider name)  No medication changes indicated OR if recent visit, treatment plan here.  BP Readings from Last 3 Encounters:  01/17/22 (!) 100/40  05/30/21 108/60  04/25/21 128/70    Lab Results  Component Value Date   HGBA1C 15.2 (H) 02/20/2019     Patient obtains medications through Adherence Packaging  30 Days    Last adherence delivery included:  Vitamin D2 50,000 units once weekly at breakfast on Sat. Spironolactone 50 mg 1 tablet at breakfast Rosuvastatin 40 mg 1 tablet at breakfast Isosorbide ER 60 mg 1.5 tablets at breakfast Montelukast 10 mg 1 tablet at bedtime Pantoprazole 40 mg 1 tablet daily Metoprolol 100 mg 1 tablet at bedtime Chlorthalidone 25 mg 1 tablet at breakfast Hydralazine 100 mg 1 tablet at breakfast, lunch and bedtime Irbesartan  300 mg 1 tablet at bedtime V-go 40 use as directed   Patient declined  last month:    Patient is due for next adherence delivery on: 02/27/2022   Called patient and reviewed medications and coordinated delivery.   This delivery to include: Vitamin D2 50,000 units once weekly at breakfast on Sat. Spironolactone 50 mg 1 tablet at breakfast Rosuvastatin 40 mg 1 tablet at breakfast Isosorbide ER 60 mg 1.5 tablets at breakfast Montelukast 10 mg 1 tablet at bedtime Pantoprazole 40  mg 1 tablet daily Metoprolol 100 mg 1 tablet at bedtime Chlorthalidone 25 mg 1 tablet at breakfast Hydralazine 100 mg 1 tablet at breakfast, lunch and bedtime Irbesartan 300 mg 1 tablet at bedtime V-go 40 use as directed Aspirin 81 mg at breakfast (patient wants this in packs)   Patient will need a short fill: No short fills needed   Coordinated acute fill: No acute fills needed   Patient declined the following medications: No medications declined   Confirmed delivery date of 02/27/2022, advised patient that pharmacy will contact them the morning of delivery.   Care Gaps: AWV - previous message to [name] Last BP - 100/40 on 01/17/2022 Last A1C - 6.0 on 05/03/2021 Foot exam - never done Hep C Screen - never done HGA1C - overdue Shingrix - overdue Flu - due  Star Rating Drugs: Rosuvastatin 40 mg - last filled 01/19/2022 30 DS at [facility] Irbesartan 300 mg  - last filled 01/19/2022 30 DS at [place] 56m/3ml - last [place]/2021 [place] - last [facility] 11/18/2020 [facility] [phone]

## 2022-02-23 ENCOUNTER — Other Ambulatory Visit: Payer: Self-pay | Admitting: *Deleted

## 2022-02-23 NOTE — Patient Outreach (Signed)
  Care Coordination   02/23/2022 Name: [name] MRN: [id number] DOB: [birth year]   Care Coordination Outreach Attempts:  An unsuccessful telephone outreach was attempted today to offer the patient information about available care coordination services as a benefit of their health plan.   Follow Up Plan:  Additional outreach attempts will be made to offer the patient care coordination information and services.   Encounter Outcome:  No Answer  Care Coordination Interventions Activated:  No   Care Coordination Interventions:  No, not indicated    Raina Mina, RN Care Management Coordinator [place] Office [phone]

## 2022-02-27 ENCOUNTER — Telehealth: Payer: Self-pay | Admitting: Pharmacist

## 2022-02-27 DIAGNOSIS — M25551 Pain in right hip: Secondary | ICD-10-CM | POA: Diagnosis not present

## 2022-02-27 DIAGNOSIS — M6281 Muscle weakness (generalized): Secondary | ICD-10-CM | POA: Diagnosis not present

## 2022-02-27 DIAGNOSIS — M545 Low back pain, unspecified: Secondary | ICD-10-CM | POA: Diagnosis not present

## 2022-02-27 NOTE — Chronic Care Management (AMB) (Signed)
    Chronic Care Management Pharmacy Assistant   Name: [name]  MRN: [id number] DOB: 1946-01-20  02/28/2022 APPOINTMENT REMINDER  Called [name], No answer, left message of appointment on 02/28/2022 at 11:00 via telephone visit with Jeni Salles, Pharm D. Notified to have all medications, supplements, blood pressure and/or blood sugar logs available during appointment and to return call if need to reschedule.  Care Gaps: AWV - previous message to [name] Last BP - 100/40 on 01/17/2022 Last A1C - 6.0 on 05/03/2021 Foot exam - never done Hep C Screen - never done A1C - overdue Covid booster - overdue Shingrix - overdue Flu - due  Star Rating Drug: Rosuvastatin 40 mg - last filled 02/22/2022 [facility] Irbesartan 300 mg  - last filled 02/22/2022 30 [facility] at [facility] Farxiga 10 mg - last filled 08/17/2020 [facility] verified with [place] (pharmacist)  Any gaps in medications fill history? Yes  [place] Pharmacist Assistant [phone]

## 2022-02-27 NOTE — Progress Notes (Deleted)
Chronic Care Management Pharmacy Note  02/27/2022 Name:  [name] MRN:  [id number] DOB:  1946-01-31  Summary: A1c at goal < 7% BP not ideally at goal < 130/80  Recommendations/Changes made from today's visit: -Recommended switching to lower sodium salt to lower salt intake -Recommended for patient to discuss stopping montelukast due to lack of indication -Recommended stepping down to every other day use of pantoprazole due to lack of symptoms -Provided patient assistance paperwork for Rest[facility] -Recommended repeat DEXA -Recommend supplementing with calcium 600 mg twice daily  Plan: BP assessment in 1-2 months Follow up in 4 months   Subjective: [name] is an 76 y.o. year old female who is a primary patient of Laurey Morale, MD.  The CCM team was consulted for assistance with disease management and care coordination needs.    Engaged with patient by telephone for follow up visit in response to provider referral for pharmacy case management and/or care coordination services.   Consent to Services:  The patient was given information about Chronic Care Management services, agreed to services, and gave verbal consent prior to initiation of services.  Please see initial visit note for detailed documentation.   Patient Care Team: Laurey Morale, MD as PCP - Cyndia Diver, MD as PCP - Cardiology (Cardiology) Reynold Bowen, MD as Consulting Physician (Endocrinology) Ashok Pall, MD as Consulting Physician (Neurosurgery) Earlie Server, MD as Consulting Physician (Orthopedic Surgery) Mauro Kaufmann, RN as Oncology Nurse Navigator Rockwell Germany, RN as Oncology Nurse Navigator Sharmon Revere as Physician Assistant (Cardiology) Viona Gilmore, [facility] as Pharmacist (Pharmacist)  Recent office visits: 01/25/2021 Alysia Penna MD - Patient was seen for Bilateral carotid artery disease, unspecified type. No medication changes. No follow up  noted.   Recent consult visits: 01/17/2022 Richardson Dopp PA-C (cardiology) - Patient was seen for Coronary artery disease involving native coronary artery of native heart without angina pectoris and additional issues. No medication changes. Follow up in 6 months and 12 months.   09/25/2021 [name] (neurosurgery) - Patient was seen for S/P lumbar fusion. No medication changes.  Frontal and lateral images of the thoracolumbar spine were acquired Follow up in 3 months.   Hospital visits: 05/10/2021 -05/20/2022 Patient was seen at [facility] [name], no notes in chart.  10/25-11/2/22 Patient admitted to [facility] for spondylolisthesis of lumbar region for arthrodesis.  Objective:  Lab Results  Component Value Date   CREATININE 0.92 03/23/2021   BUN 14 03/23/2021   GFR 68.46 02/20/2019   EGFR 66 03/23/2021   GFRNONAA >60 03/23/2021   GFRAA 59 (L) 03/23/2020   NA 146 (H) 03/23/2021   K 3.9 03/23/2021   CALCIUM 9.6 03/23/2021   CO2 26 03/23/2021   GLUCOSE 37 (LL) 03/23/2021    Lab Results  Component Value Date/Time   HGBA1C 15.2 (H) 02/20/2019 10:22 AM   HGBA1C 9.3 (H) 03/27/2017 04:15 PM   GFR 68.46 02/20/2019 10:22 AM   GFR 78.76 03/26/2017 11:22 AM   MICROALBUR 1.7 03/21/2012 11:14 AM   MICROALBUR 0.7 03/23/2011 11:44 AM    Last diabetic Eye exam:  Lab Results  Component Value Date/Time   HMDIABEYEEXA Retinopathy (A) 04/12/2021 08:57 AM    Last diabetic Foot exam: No results found for: "HMDIABFOOTEX"   Lab Results  Component Value Date   CHOL 113 03/23/2021   HDL 49 03/23/2021   LDLCALC 50 03/23/2021   TRIG 63  03/23/2021   CHOLHDL 2.3 03/23/2021       Latest Ref Rng & Units 03/23/2021   11:41 AM 03/23/2021    9:45 AM 03/23/2020   10:02 AM  Hepatic Function  Total Protein 6.0 - 8.5 g/dL 6.8  6.6  6.9   Albumin 3.7 - 4.7 g/dL 4.5  3.7  3.7   AST 0 - 40 IU/L 23  20  22    ALT 0 - 32 IU/L 18  19  21    Alk  Phosphatase 44 - 121 IU/L 79  68  69   Total Bilirubin 0.0 - 1.2 mg/dL 0.6  0.7  0.6     Lab Results  Component Value Date/Time   TSH 2.60 03/09/2020 10:43 AM   TSH 2.60 02/20/2019 10:22 AM       Latest Ref Rng & Units 03/23/2021    9:45 AM 03/23/2020   10:01 AM 03/09/2020   10:43 AM  CBC  WBC 4.0 - 10.5 K/uL 8.8  8.0  9.2   Hemoglobin 12.0 - 15.0 g/dL 11.7  13.1  13.1   Hematocrit 36.0 - 46.0 % 37.2  41.5  40.0   Platelets 150 - 400 K/uL 209  177  212     Lab Results  Component Value Date/Time   VD25OH 55.00 12/28/2014 10:56 AM    Clinical ASCVD: Yes  The ASCVD Risk score (Arnett DK, et al., 2019) failed to calculate for the following reasons:   The valid total cholesterol range is 130 to 320 mg/dL       05/07/2019    1:55 PM 11/24/2014    2:53 PM  Depression screen PHQ 2/9  Decreased Interest 0 0  Down, Depressed, Hopeless 1 0  PHQ - 2 Score 1 0      Social History   Tobacco Use  Smoking Status Former   Types: Cigarettes   Quit date: 01/22/1967   Years since quitting: 55.1  Smokeless Tobacco Never   BP Readings from Last 3 Encounters:  01/17/22 (!) 100/40  05/30/21 108/60  04/25/21 128/70   Pulse Readings from Last 3 Encounters:  01/17/22 66  05/30/21 66  04/24/21 70   Wt Readings from Last 3 Encounters:  01/17/22 172 lb (78 kg)  05/30/21 163 lb 12.8 oz (74.3 kg)  04/24/21 177 lb 4.8 oz (80.4 kg)   BMI Readings from Last 3 Encounters:  01/17/22 30.47 kg/m  05/30/21 29.02 kg/m  04/24/21 31.41 kg/m    Assessment/Interventions: Review of patient past medical history, allergies, medications, health status, including review of consultants reports, laboratory and other test data, was performed as part of comprehensive evaluation and provision of chronic care management services.   SDOH:  (Social Determinants of Health) assessments and interventions performed: Yes   SDOH Screenings   Alcohol Screen: Not on file  Depression (PHQ2-9): Low Risk   (05/07/2019)   Depression (PHQ2-9)    PHQ-2 Score: 1  Financial Resource Strain: Low Risk  (09/13/2021)   Overall Financial Resource Strain (CARDIA)    Difficulty of Paying Living Expenses: Not very hard  Food Insecurity: No Food Insecurity (05/07/2019)   Hunger Vital Sign    Worried About Running Out of Food in the Last Year: Never true    Ran Out of Food in the Last Year: Never true  Housing: Low Risk  (05/07/2019)   Housing    Last Housing Risk Score: 0  Physical Activity: Insufficiently Active (05/07/2019)   Exercise Vital Sign  Days of Exercise per Week: 1 day    Minutes of Exercise per Session: 10 min  Social Connections: Not on file  Stress: No Stress Concern Present (05/07/2019)   [place]    Feeling of Stress : Not at all  Tobacco Use: Medium Risk (01/17/2022)   Patient History    Smoking Tobacco Use: Former    Smokeless Tobacco Use: Never    Passive Exposure: Not on file  Transportation Needs: No Transportation Needs (09/13/2021)   PRAPARE - Hydrologist (Medical): No    Lack of Transportation (Non-Medical): No    CCM Care Plan  Allergies  Allergen Reactions   Dulaglutide Nausea Only    Other reaction(s): made her sick Other reaction(s): made her sick    Medications Reviewed Today     Reviewed by Sheralyn Boatman, CMA (Certified Medical Assistant) on 01/17/22 at 1104  Med List Status: <None>   Medication Order Taking? Sig Documenting Provider Last Dose Status Informant  ACCU-CHEK GUIDE test strip [id number] Yes 1 each by Other route in the morning and at bedtime. [provider] Taking Active   aspirin EC 81 MG tablet [id number] Yes Take 1 tablet (81 mg total) by mouth daily. Swallow whole. Richardson Dopp T, PA-C Taking Active   chlorthalidone (HYGROTON) 25 MG tablet [id number] Yes Take 1 tablet (25 mg total) by mouth daily. Loel Dubonnet, NP Taking Active    ergocalciferol (VITAMIN D2) 50000 UNITS capsule [id number] Yes Take 50,000 Units by mouth every Saturday. On Saturday. [provider] Taking Active Self  FARXIGA 10 MG TABS tablet [id number] Yes Take 10 mg by mouth every morning. [provider] Taking Active   gabapentin (NEURONTIN) 300 MG capsule [id number] Yes Take 300 mg by mouth as needed. [provider] Taking Active   HUMALOG 100 UNIT/ML injection [id number] Yes  [provider] Taking Active   hydrALAZINE (APRESOLINE) 100 MG tablet [id number] Yes Take 1 tablet (100 mg total) by mouth 3 (three) times daily. Loel Dubonnet, NP Taking Active   Insulin Disposable Pump (V-GO 40) KIT [id number] Yes Inject 1 application into the skin See admin instructions. [provider] Taking Active   irbesartan (AVAPRO) 300 MG tablet [id number] Yes Take 1 tablet (300 mg total) by mouth daily. Loel Dubonnet, NP Taking Active   isosorbide mononitrate (IMDUR) 60 MG 24 hr tablet [id number] Yes TAKE 1 & 1/2 (ONE & ONE-HALF) TABLETS BY MOUTH ONCE DAILY Sherren Mocha, MD Taking Active   latanoprost (XALATAN) 0.005 % ophthalmic solution [id number] Yes Place 1 drop into both eyes at bedtime.  [provider] Taking Active Self  metoprolol succinate (TOPROL-XL) 100 MG 24 hr tablet [id number] Yes Take 1 tablet (100 mg total) by mouth daily. Loel Dubonnet, NP Taking Active   montelukast (SINGULAIR) 10 MG tablet [id number] Yes Take 10 mg by mouth daily. [provider] Taking Active Self  nitroGLYCERIN (NITROSTAT) 0.4 MG SL tablet [id number] Yes Place 1 tablet (0.4 mg total) under the tongue every 5 (five) minutes as needed for chest pain. Erlene Quan, PA-C Taking Active Self  OZEMPIC, 1 MG/DOSE, 4 MG/3ML SOPN [id number] Yes Inject 1 mg into the skin once a week. [provider] Taking Active   pantoprazole (PROTONIX) 40 MG tablet [id number] Yes Take 1 tablet (40 mg total) by mouth daily. Laurey Morale, MD Taking Active   Probiotic Product (PROBIOTIC ADVANCED PO) [id number] Yes  Take 1 capsule by mouth daily. [provider] Taking Active   rosuvastatin (CRESTOR) 40 MG tablet [id number] Yes Take 1 tablet (40 mg total) by mouth daily. Sherren Mocha, MD Taking Active   spironolactone (ALDACTONE) 50 MG tablet [id number] Yes Take 1 tablet (50 mg total) by mouth daily. Sherren Mocha, MD Taking Active             Patient Active Problem List   Diagnosis Date Noted   Asymptomatic varicose veins of both lower extremities 11/04/2020   Ductal carcinoma in situ (DCIS) of left breast 01/06/2020   Right-sided thoracic back pain 07/01/2019   Hyperlipidemia LDL goal <70 07/17/2017   History of lumbar spinal fusion    History of fusion of lumbar spine    Anemia due to blood loss 11/02/2016    Class: Acute   Lethargy 10/31/2016   AKI (acute kidney injury) ([place]) 10/31/2016   (HFpEF) heart failure with preserved ejection fraction ([place]) 10/31/2016   Cough    Leukocytosis    Spondylolisthesis, lumbar region 10/30/2016    Class: Chronic   Spinal stenosis of lumbar region 10/30/2016    Class: Chronic   Retained orthopedic hardware 10/30/2016    Class: Chronic   Spinal stenosis of lumbar region with neurogenic claudication 10/30/2016   Genetic testing 09/14/2015   Atypical ductal hyperplasia of left breast 08/24/2015   Family history of breast cancer    Family history of prostate cancer    Family history of stomach cancer    Abnormal nuclear stress test 12/29/2014   Cervical spondylosis with myelopathy and radiculopathy 06/11/2014   Carpal tunnel syndrome 05/14/2014   Aftercare following surgery of the circulatory system, NEC 03/11/2014   Pain of right lower extremity 03/11/2014   Atypical lobular hyperplasia of left breast 02/12/2014   Fibromyalgia 09/21/2013   Carotid artery disease ([address]) 02/29/2012   Back abscess 01/22/2012   Chest pain with moderate risk of acute coronary  syndrome 12/09/2009   Shortness of breath 06/10/2009   Essential hypertension, malignant 03/21/2009   Orthostatic hypotension 01/31/2009   CAD (coronary artery disease) 12/23/2008   Normocytic anemia 07/21/2008   Headache(784.0) 07/21/2008   History of TIA (transient ischemic attack) 07/21/2008   Edema 02/27/2008   Diabetes mellitus with complication ([place]) 05/08/5944   Depression 07/16/2007   Dyslipidemia 02/21/2007   Resistant hypertension 02/21/2007   Asthma 02/21/2007   GERD 02/21/2007   LOW BACK PAIN 02/21/2007    Immunization History  Administered Date(s) Administered   Influenza Split 06/20/1999, 03/16/2010, 03/19/2011, 03/26/2012, 04/21/2013   Influenza, High Dose Seasonal PF 03/26/2017   Influenza, Quadrivalent, Recombinant, Inj, Pf 04/02/2018, 04/09/2019   Influenza,inj,Quad PF,6+ Mos 04/09/2014, 04/14/2015   Influenza-Unspecified 04/08/2014, 04/08/2016, 04/08/2020   PFIZER(Purple Top)SARS-COV-2 Vaccination 08/29/2019, 09/19/2019, 04/15/2020   Pfizer Covid-19 Vaccine Bivalent Booster 40yr & up 05/10/2021   Pneumococcal Conjugate-13 03/26/2017   Pneumococcal Polysaccharide-23 06/09/1999, 05/24/2009, 05/04/2014, 03/09/2020   Tdap 11/27/2011, 10/06/2021   Zoster Recombinat (Shingrix) 09/17/2021   atient is retired and doesn't do anything if she has to. She goes to PT twice a week which started in January. She had PT and OT after the surgery. Patient lives with her husband and he is semi-retired.  Patient does go out to dinner with ex-co workers some as well and she retired from being a bBanker   Patient shares house work with husband and has to take a lot of breaks when doing things. She reports this seems like it's been getting better since surgery.  Patient loves to sleep and denies any issues with this. Her mother had issues with sleep but none of her or her 2 siblings have problems with sleep. Patient feels tired some and takes a nap sometimes but usually she so busy  with everything that she doesn't have time to nap.  Patient eats out 2-3 times a week and only restaurants and doesn't eat fast food and doesn't like it. Patient does eat 3 meals a day and has lost a lot of weight since Ozempic.  Patient does PT exercises and stretching for back and has some stiffness in her hips and lower back. Patient reports it is painful to walk with rods in her back. Patient does love water therapy and did that for years. They don't have a pool at current therapist but wants to go back to [facility] which [facility] a heated pool.  Patient denies any current issues with medications as she has been taking them for so long. Patient reports the V-go kit is expensive and inquired about any support there. Patient reports she knows what all of her medications are for.  Patient was checking BP and blood sugars every day and hasn't been doing this as often anymore.  -no showed her DEXA?  -pcp visit? Cpe?  -dexcom?  Conditions to be addressed/monitored:  Hypertension, Hyperlipidemia, Diabetes, Coronary Artery Disease, GERD, Osteopenia, and Allergic Rhinitis  Conditions addressed this visit: ***  There are no care plans that you recently modified to display for this patient.   Medication Assistance:  [employer] obtained through [employer] and NovoNordisk medication assistance program.  Enrollment ends 07/08/22   Compliance/Adherence/Medication fill history: Care Gaps: Foot exam, A1c, shingrix, influenza vaccine, tetanus, Hep C screening Last BP - 100/40 on 01/17/2022 Last A1C - 6.0 on 05/03/2021  Star-Rating Drugs: Rosuvastatin 40 mg - last filled 02/22/2022 30 DS at [facility] Irbesartan 300 mg  - last filled 02/22/2022 30 DS at [facility] - getting from [facility] Ozempic 53m/3ml - getting from [facility]  [place] pharmacy is:  [place] - [address] [place] [address]Phone: [phone] Fax: [phone] [facility] [place] [address] [place]: [phone]: [phone] Uses pill box? Yes Pt endorses 90% compliance - misses daytime hydralazine often  We discussed: Benefits of medication synchronization, packaging and delivery as well as enhanced pharmacist oversight with [facility]. Patient decided to: Utilize [facility] pharmacy for medication synchronization, packaging and delivery  Care Plan and Follow Up Patient Decision:  Patient agrees to Care Plan and Follow-up.  Plan: The care management team will reach out to the patient again over the next 14 days.  MJeni Salles PharmD, BPrices ForkPharmacist [place]ra[place]

## 2022-02-28 ENCOUNTER — Ambulatory Visit: Payer: Self-pay

## 2022-02-28 ENCOUNTER — Telehealth: Payer: Medicare Other

## 2022-02-28 NOTE — Patient Outreach (Signed)
  Care Coordination   02/28/2022 Name: Therma Lasure MRN: [id number] DOB: 1946/04/08   Care Coordination Outreach Attempts:  A second unsuccessful outreach was attempted today to offer the patient with information about available care coordination services as a benefit of their health plan.     Follow Up Plan:  Additional outreach attempts will be made to offer the patient care coordination information and services.   Encounter Outcome:  No Answer  Care Coordination Interventions Activated:  No   Care Coordination Interventions:  No, not indicated    Daneen Schick, BSW, CDP Social Worker, Certified Dementia Practitioner Care Coordination [phone]

## 2022-03-06 DIAGNOSIS — M6281 Muscle weakness (generalized): Secondary | ICD-10-CM | POA: Diagnosis not present

## 2022-03-06 DIAGNOSIS — M545 Low back pain, unspecified: Secondary | ICD-10-CM | POA: Diagnosis not present

## 2022-03-06 DIAGNOSIS — M25551 Pain in right hip: Secondary | ICD-10-CM | POA: Diagnosis not present

## 2022-03-13 DIAGNOSIS — M6281 Muscle weakness (generalized): Secondary | ICD-10-CM | POA: Diagnosis not present

## 2022-03-13 DIAGNOSIS — M545 Low back pain, unspecified: Secondary | ICD-10-CM | POA: Diagnosis not present

## 2022-03-13 DIAGNOSIS — M25551 Pain in right hip: Secondary | ICD-10-CM | POA: Diagnosis not present

## 2022-03-14 ENCOUNTER — Telehealth: Payer: Self-pay

## 2022-03-14 ENCOUNTER — Other Ambulatory Visit (HOSPITAL_BASED_OUTPATIENT_CLINIC_OR_DEPARTMENT_OTHER): Payer: Self-pay | Admitting: Family

## 2022-03-14 DIAGNOSIS — I10 Essential (primary) hypertension: Secondary | ICD-10-CM

## 2022-03-14 NOTE — Telephone Encounter (Signed)
Rx(s) sent to pharmacy electronically.  

## 2022-03-14 NOTE — Patient Outreach (Signed)
  Care Coordination   03/14/2022 Name: [name] MRN: [id number] DOB: [birth year]   Care Coordination Outreach Attempts:  A third unsuccessful outreach was attempted today to offer the patient with information about available care coordination services as a benefit of their health plan.   Follow Up Plan:  No further outreach attempts will be made at this time. We have been unable to contact the patient to offer or enroll patient in care coordination services  Encounter Outcome:  No Answer  Care Coordination Interventions Activated:  No   Care Coordination Interventions:  No, not indicated    Daneen Schick, BSW, CDP Social Worker, Certified Dementia Practitioner Care Coordination [phone]

## 2022-03-15 ENCOUNTER — Telehealth: Payer: Self-pay | Admitting: Family Medicine

## 2022-03-15 ENCOUNTER — Other Ambulatory Visit (HOSPITAL_BASED_OUTPATIENT_CLINIC_OR_DEPARTMENT_OTHER): Payer: Self-pay | Admitting: Family

## 2022-03-15 ENCOUNTER — Other Ambulatory Visit: Payer: Self-pay | Admitting: Cardiovascular Disease

## 2022-03-15 NOTE — Telephone Encounter (Signed)
Rx request sent to pharmacy.  

## 2022-03-15 NOTE — Chronic Care Management (AMB) (Signed)
Chronic Care Management Pharmacy Assistant   Name: Brynnly Bonet  MRN: [id number] DOB: 29-Apr-1946  Reason for Encounter: Medication Review/ Medication Coordination   Recent office visits:  None  Recent consult visits:  None  Hospital visits:  None in previous 6 months  Medications: Outpatient Encounter Medications as of 03/15/2022  Medication Sig   ACCU-CHEK GUIDE test strip 1 each by Other route in the morning and at bedtime.   aspirin EC 81 MG tablet Take 1 tablet (81 mg total) by mouth daily. Swallow whole.   chlorthalidone (HYGROTON) 25 MG tablet Take 1 tablet (25 mg total) by mouth daily.   ergocalciferol (VITAMIN D2) 50000 UNITS capsule Take 50,000 Units by mouth every Saturday. On Saturday.   FARXIGA 10 MG TABS tablet Take 10 mg by mouth every morning.   gabapentin (NEURONTIN) 300 MG capsule Take 300 mg by mouth as needed.   HUMALOG 100 UNIT/ML injection    hydrALAZINE (APRESOLINE) 100 MG tablet Take 1 tablet (100 mg total) by mouth 3 (three) times daily.   Insulin Disposable Pump (V-GO 40) KIT Inject 1 application into the skin See admin instructions.   irbesartan (AVAPRO) 300 MG tablet Take 1 tablet (300 mg total) by mouth daily.   isosorbide mononitrate (IMDUR) 60 MG 24 hr tablet TAKE 1 & 1/2 (ONE & ONE-HALF) TABLETS BY MOUTH ONCE DAILY   latanoprost (XALATAN) 0.005 % ophthalmic solution Place 1 drop into both eyes at bedtime.    metoprolol succinate (TOPROL-XL) 100 MG 24 hr tablet TAKE ONE TABLET BY MOUTH EVERYDAY AT BEDTIME   montelukast (SINGULAIR) 10 MG tablet Take 10 mg by mouth daily.   nitroGLYCERIN (NITROSTAT) 0.4 MG SL tablet Place 1 tablet (0.4 mg total) under the tongue every 5 (five) minutes as needed for chest pain.   OZEMPIC, 1 MG/DOSE, 4 MG/3ML SOPN Inject 1 mg into the skin once a week.   pantoprazole (PROTONIX) 40 MG tablet TAKE ONE TABLET BY MOUTH ONCE daily   Probiotic Product (PROBIOTIC ADVANCED PO) Take 1 capsule by mouth daily.    rosuvastatin (CRESTOR) 40 MG tablet TAKE ONE TABLET BY MOUTH ONCE DAILY   spironolactone (ALDACTONE) 50 MG tablet Take 1 tablet (50 mg total) by mouth daily.   No facility-administered encounter medications on file as of 03/15/2022.  Reviewed chart for medication changes ahead of medication coordination call.   BP Readings from Last 3 Encounters:  01/17/22 (!) 100/40  05/30/21 108/60  04/25/21 128/70    Lab Results  Component Value Date   HGBA1C 15.2 (H) 02/20/2019     Patient obtains medications through Adherence Packaging  30 Days   Last adherence delivery included:  Vitamin D2 50,000 units once weekly at breakfast on Sat. Spironolactone 50 mg 1 tablet at breakfast Rosuvastatin 40 mg 1 tablet at breakfast Isosorbide ER 60 mg 1.5 tablets at breakfast Montelukast 10 mg 1 tablet at bedtime Pantoprazole 40 mg 1 tablet daily Metoprolol 100 mg 1 tablet at bedtime Chlorthalidone 25 mg 1 tablet at breakfast Hydralazine 100 mg 1 tablet at breakfast, lunch and bedtime Irbesartan 300 mg 1 tablet at bedtime V-go 40 use as directed Aspirin 81 mg at breakfast (patient wants this in packs)   Patient will need a short fill: No short fills needed   Coordinated acute fill: No acute fills needed   Patient declined the following medications: No medications declined   Confirmed delivery date of 02/27/2022, advised patient that pharmacy will contact them the morning of delivery.  Patient is due for next adherence delivery on: 03/28/22. Called patient and reviewed medications and coordinated delivery. Packs 30 DS  This delivery to include: Vitamin D2 50,000 units once weekly at breakfast on Sat. Spironolactone 50 mg 1 tablet at breakfast Rosuvastatin 40 mg 1 tablet at breakfast Isosorbide ER 60 mg 1.5 tablets at breakfast Montelukast 10 mg 1 tablet at bedtime Pantoprazole 40 mg 1 tablet daily (Vial) Metoprolol 100 mg 1 tablet at bedtime Chlorthalidone 25 mg 1 tablet at  breakfast Hydralazine 100 mg 1 tablet at breakfast, lunch and bedtime Irbesartan 300 mg 1 tablet at bedtime V-go 40 use as directed (Vial) Aspirin 81 mg at breakfast   AMOXICILLIN 875 MG TABLET 02/07/2022 3   Patient declined to go over medications in order stated she had no changes and if anything is wrong she will call the pharmacy to correct.  Confirmed delivery date of 03/28/22, advised patient that pharmacy will contact them the morning of delivery.   Care Gaps: Foot Exam - Overdue Hepatitis C Screening - Overdue HGB A1C - Overdue COVID Booster - Overdue Zoster Vaccine  - Overdue Flu Vaccine - Overdue BP- 100/40 01/17/22 AWV- previous message to [name] Last A1C: 6.0 on 05/03/2021  Star Rating Drugs: Rosuvastatin 40 mg - last filled 02/22/2022 30 DS at [facility] Irbesartan 300 mg  - last filled 02/22/2022 30 DS at [facility] Farxiga 10 mg - last filled 08/17/2020 [facility] verified  [place] Pharmacist Assistant [phone]

## 2022-03-21 DIAGNOSIS — M25551 Pain in right hip: Secondary | ICD-10-CM | POA: Diagnosis not present

## 2022-03-21 DIAGNOSIS — M545 Low back pain, unspecified: Secondary | ICD-10-CM | POA: Diagnosis not present

## 2022-03-21 DIAGNOSIS — M6281 Muscle weakness (generalized): Secondary | ICD-10-CM | POA: Diagnosis not present

## 2022-03-22 ENCOUNTER — Ambulatory Visit: Payer: Medicare Other | Admitting: Nurse Practitioner

## 2022-03-26 ENCOUNTER — Encounter: Payer: Self-pay | Admitting: Hematology

## 2022-03-26 ENCOUNTER — Inpatient Hospital Stay: Payer: Medicare Other | Attending: Nurse Practitioner | Admitting: Hematology

## 2022-03-26 ENCOUNTER — Other Ambulatory Visit: Payer: Self-pay

## 2022-03-26 VITALS — BP 110/46 | HR 62 | Temp 98.7°F | Resp 18 | Ht 63.0 in | Wt 176.7 lb

## 2022-03-26 DIAGNOSIS — I2510 Atherosclerotic heart disease of native coronary artery without angina pectoris: Secondary | ICD-10-CM | POA: Insufficient documentation

## 2022-03-26 DIAGNOSIS — Z794 Long term (current) use of insulin: Secondary | ICD-10-CM | POA: Insufficient documentation

## 2022-03-26 DIAGNOSIS — Z79899 Other long term (current) drug therapy: Secondary | ICD-10-CM | POA: Insufficient documentation

## 2022-03-26 DIAGNOSIS — Z7984 Long term (current) use of oral hypoglycemic drugs: Secondary | ICD-10-CM | POA: Insufficient documentation

## 2022-03-26 DIAGNOSIS — I10 Essential (primary) hypertension: Secondary | ICD-10-CM | POA: Insufficient documentation

## 2022-03-26 DIAGNOSIS — M545 Low back pain, unspecified: Secondary | ICD-10-CM | POA: Diagnosis not present

## 2022-03-26 DIAGNOSIS — Z8673 Personal history of transient ischemic attack (TIA), and cerebral infarction without residual deficits: Secondary | ICD-10-CM | POA: Diagnosis not present

## 2022-03-26 DIAGNOSIS — Z7982 Long term (current) use of aspirin: Secondary | ICD-10-CM | POA: Diagnosis not present

## 2022-03-26 DIAGNOSIS — E119 Type 2 diabetes mellitus without complications: Secondary | ICD-10-CM | POA: Insufficient documentation

## 2022-03-26 DIAGNOSIS — G8929 Other chronic pain: Secondary | ICD-10-CM | POA: Diagnosis not present

## 2022-03-26 DIAGNOSIS — M25551 Pain in right hip: Secondary | ICD-10-CM | POA: Insufficient documentation

## 2022-03-26 DIAGNOSIS — Z923 Personal history of irradiation: Secondary | ICD-10-CM | POA: Insufficient documentation

## 2022-03-26 DIAGNOSIS — D0512 Intraductal carcinoma in situ of left breast: Secondary | ICD-10-CM | POA: Insufficient documentation

## 2022-03-26 NOTE — Progress Notes (Signed)
[place]   Telephone:(336) [phone] Fax:(336) [phone]   Clinic Follow up Note   Patient Care Team: Laurey Morale, MD as PCP - Cyndia Diver, MD as PCP - Cardiology (Cardiology) Reynold Bowen, MD as Consulting Physician (Endocrinology) Ashok Pall, MD as Consulting Physician (Neurosurgery) Earlie Server, MD as Consulting Physician (Orthopedic Surgery) Mauro Kaufmann, RN as Oncology Nurse Navigator Rockwell Germany, RN as Oncology Nurse Navigator Sharmon Revere as Physician Assistant (Cardiology) Viona Gilmore, [facility] as Pharmacist (Pharmacist)  Date of Service:  03/26/2022  CHIEF COMPLAINT: f/u of left breast DCIS  CURRENT THERAPY:  Surveillance  ASSESSMENT & PLAN:  [name] is a 76 y.o. post-hysterectomy female with   1. Left Breast DCIS, Intermediate Grade, ER+/PR+ -discovered on screening MRI, diagnosed in 12/2019. S/p Left lumpectomy with Dr Brantley Stage on 01/13/20 and Adjuvant Radiation with Dr Isidore Moos 02/11/20-03/04/20.  -previously did not tolerate anastrozole or tamoxifen. -she is on surveillance with alternating annual mammogram and MRI.  -most recent mammogram 11/15/21 and MRI 11/29/21 were benign. I recommend spreading these out more next year. -She is clinically doing well. She has some very mild residual lymphedema on physical exam. I again encouraged her to massage the area.   2. H/o Left breast ALH, Genetics Negative  -S/p left breast lumpectomy 01/26/14 for [facility].  -did not tolerate anastrozole or tamoxifen, declined treatment with Raloxifene    3. HTN, DM, CAD, history of TIA -She will continue follow-up with her primary care physician and continues medications.  -due to recent elevated BP, her cardiologist switched her from Lasix to chlorthalidone on 03/06/21.   4. Chronic Lower back pain, Right hip pain  -She had 2 back surgeries in the past -In a wheelchair today, ambulates with cane -Now followed by Dr. Sena Hitch at  [facility] neurosurgery -S/p lumbar fusion 05/02/21     PLAN: -f/u in 1 year with NP [name] (no lab)    No problem-specific Assessment & Plan notes found for this encounter.   SUMMARY OF ONCOLOGIC HISTORY: Oncology History Overview Note  Cancer Staging No matching staging information was found for the patient.    Atypical lobular hyperplasia of left breast  01/26/2014 Surgery   Left Lumpectomy   Diagnosis Breast, excision, Palpable left - LOBULAR NEOPLASIA (ATYPICAL LOBULAR HYPERPLASIA) SEE COMMENT - MICROCALCIFICATIONS IDENTIFIED. - SURGICAL MARGIN, NEGATIVE FOR ATYPIA OR MALIGNANCY. Microscopic Comment The surgical resection margin(s) of the specimen were inked and microscopically evaluated. Multiple representative sections demonstrate foci of atypical lobular hyperplasia. Additional non-neoplastic findings to include benign radial scar, fibrocystic change, usual ductal hyperplasia, columnar cell hyperplasia/change without atypia, benign adenosis, patchy mild periductal chronic inflammation, and solitary hyalinizing calcified fibroadenomatoid nodule. There are abundant microcalcifications present within benign lobules. (CRR:ecj 01/27/2014)   02/12/2014 Initial Diagnosis   Atypical lobular hyperplasia of left breast   11/2014 - 04/2015 Anti-estrogen oral therapy   She tried anastrozole in 11/2014 and Tamoxifen in 03/2015 for about one month each, and stopped due to side effects.    06/27/2017 Pathology Results   Diagnosis 1. Breast, left, needle core biopsy, posterior upper outer quadrant - FIBROCYSTIC CHANGES. - THERE IS NO EVIDENCE OF MALIGNANCY. - SEE COMMENT. 2. Breast, left, needle core biopsy, anterior lower inner quadrant - FIBROCYSTIC CHANGES. - THERE IS NO EVIDENCE OF MALIGNANCY. - SEE COMMENT. Microscopic Comment 1. , 2. The results were called to [facility] on 06/28/2017. (JBK:ecj 06/29/1027)   07/24/2017 Surgery   Left  Lumpectomy  Diagnosis Breast, lumpectomy, Left - FIBROCYSTIC CHANGES. - HEALING BIOPSY SITE. - THERE IS NO EVIDENCE OF MALIGNANCY. - SEE COMMENT. Microscopic Comment The surgical resection margin(s) of the specimen were inked and microscopically evaluated.   Ductal carcinoma in situ (DCIS) of left breast  12/16/2019 Initial Biopsy   Diagnosis 1. Breast, right, needle core biopsy, upper inner - FIBROCYSTIC CHANGES. - NO MALIGNANCY IDENTIFIED. 2. Breast, right, needle core biopsy, lateral - FIBROCYSTIC CHANGES WITH FOCAL ADENOSIS. - NO MALIGNANCY IDENTIFIED. 3. Breast, left, needle core biopsy, lateral - DUCTAL CARCINOMA IN SITU, INTERMEDIATE GRADE. - SEE MICROSCOPIC DESCRIPTION Microscopic Comment 3. Immunohistochemistry for p63, calponin and smooth muscle myosin and E-Cadherin supports the diagnosis. Estrogen and progesterone receptors will be performed. Dr. Vic Ripper has reviewed part 3 and agrees.   12/16/2019 Receptors her2   3. PROGNOSTIC INDICATORS Results: IMMUNOHISTOCHEMICAL AND MORPHOMETRIC ANALYSIS PERFORMED MANUALLY Estrogen Receptor: 95%, POSITIVE, STRONG-MODERATE STAINING INTENSITY Progesterone Receptor: >95%, POSITIVE, STRONG STAINING INTENSITY   01/06/2020 Initial Diagnosis   Ductal carcinoma in situ (DCIS) of left breast   01/13/2020 Surgery   LEFT BREAST LUMPECTOMY WITH RADIOACTIVE SEED LOCALIZATION by Dr Brantley Stage   01/13/2020 Pathology Results   FINAL MICROSCOPIC DIAGNOSIS:   A. BREAST, LEFT, LUMPECTOMY:  - Intermediate grade ductal carcinoma in situ with necrosis, spanning 2  cm.  - Resection margins are negative.  - Biopsy site and cavity.  - See oncology table.    02/11/2020 - 03/04/2020 Radiation Therapy   Adjuvant Radiation with Dr Isidore Moos     Anti-estrogen oral therapy   She declined as she did not tolerate before      INTERVAL HISTORY:  [name] is here for a follow up of DCIS. She was last seen by me one year ago. She presents to the  clinic alone. She reports she is doing well overall. She notes she had back surgery in 04/2021, but she does not feel her back pain is better. She reports she ambulates with a cane normally but uses a wheelchair for long distances. She endorses using biofreeze (on the skin); she notes she doesn't like using prescription pain medications due to the fatigue and drowsiness they cause. She adds OTC medications, like tylenol, doesn't do anything.   All other systems were reviewed with the patient and are negative.  MEDICAL HISTORY:  Past Medical History:  Diagnosis Date   Abnormal nuclear stress test 12/29/2014   Allergy    Anemia, unspecified    Arthritis    Blood transfusion without reported diagnosis    2018   CAD (coronary artery disease)    sees. Dr. Burt Knack. s/p cath with PCI of RCA (xience des) June 2010. Pt also with LAD and D2 dzs-NL FFR in both areas med Rx   Carotid artery occlusion    Cataract    sees Dr. Herbert Deaner    Depressive disorder, not elsewhere classified    Duodenal nodule    Edema    Fibromyalgia    Gastric polyps    COLON   GERD (gastroesophageal reflux disease)    Glaucoma    narrow angle, sees Dr. Shelly Flatten)    Hearing loss    left ear   Heart murmur    questionable   Hemorrhoids    HSV (herpes simplex virus) anogenital infection 05/2014   HTN (hypertension)    Hyperlipidemia    Low back pain    she sees Dr. Louanne Skye, also Dr. Ernestina Patches for pain medications and Benjiman Core PA  for injections    Obesity    Osteopenia 01/2018   T score -1.6 FRAX 7.1% / 1.4%   Peripheral vascular disease ([place])    Resistant hypertension 02/21/2007   Qualifier: Diagnosis of  By: Tiney Rouge CMA, Ellison Hughs     Stroke [facility])    TIA  "many yrs ago"     TIA (transient ischemic attack)    also Hx of it.    Type II or unspecified type diabetes mellitus without mention of complication, not stated as uncontrolled    sees Dr. Carrolyn Meiers     SURGICAL HISTORY: Past Surgical  History:  Procedure Laterality Date   ABDOMINAL HYSTERECTOMY  age 45   TAH and USO  Leiomyomata   ANTERIOR CERVICAL CORPECTOMY N/A 06/11/2014   Procedure: ANTERIOR CERVICAL CORPECTOMY CERVICAL SIX; WITH CERVICAL FIVE TO CERVICAL SEVEN ARTHRODESIS;  Surgeon: Ashok Pall, MD;  Location: [place] NEURO ORS;  Service: Neurosurgery;  Laterality: N/A;   BACK SURGERY  2008   lumb fusion   BREAST BIOPSY Left 01/26/2014   Procedure: EXCISION LEFT BREAST MASS;  Surgeon: Adin Hector, MD;  Location: [place];  Service: General;  Laterality: Left;   BREAST LUMPECTOMY WITH RADIOACTIVE SEED LOCALIZATION Left 07/24/2017   Procedure: LEFT BREAST LUMPECTOMY WITH RADIOACTIVE SEED LOCALIZATION ERAS;  Surgeon: Erroll Luna, MD;  Location: [place];  Service: General;  Laterality: Left;   BREAST LUMPECTOMY WITH RADIOACTIVE SEED LOCALIZATION Left 01/13/2020   Procedure: LEFT BREAST LUMPECTOMY WITH RADIOACTIVE SEED LOCALIZATION;  Surgeon: Erroll Luna, MD;  Location: [place];  Service: General;  Laterality: Left;   CARDIAC CATHETERIZATION     CARDIAC CATHETERIZATION N/A 12/29/2014   Procedure: Left Heart Cath and Coronary Angiography;  Surgeon: Sherren Mocha, MD;  Location: [place] CV LAB;  Service: Cardiovascular;  Laterality: N/A;   CAROTID ENDARTERECTOMY  march 2012   per Dr. Morton Amy   CARPAL TUNNEL RELEASE Left 05/14/2014   Procedure: Left Carpal tunnel release;  Surgeon: Ashok Pall, MD;  Location: [place] NEURO ORS;  Service: Neurosurgery;  Laterality: Left;  Left Carpal tunnel release   CARPAL TUNNEL RELEASE Bilateral    CHOLECYSTECTOMY     COLONOSCOPY  06/26/2017   per Dr. Henrene Pastor, adenomatous polyp, repeat in 5 yrs     CORONARY STENT PLACEMENT     Dr. Burt Knack   DILATION AND CURETTAGE OF UTERUS     ESOPHAGOGASTRODUODENOSCOPY  02-08-06   per Dr. Henrene Pastor, gastritis    EYE SURGERY Left June 2016   Cataract   GLAUCOMA SURGERY     with laser, per Dr.  Henrene Pastor    HARDWARE REMOVAL  10/30/2016   Procedure: HARDWARE REMOVAL;  Surgeon: Jessy Oto, MD;  Location: [place];  Service: Orthopedics;;   LUMBAR FUSION  2008   POLYPECTOMY     POSTERIOR LUMBAR FUSION  10/30/2016   Removal of hardware rods and pedicle screws lumbar four, Extension of fusion L4-5 to L5-S1 with reinsertion of screws at L 4, left L5-S1 transforaminal lumbar interbody fusion, Exploration  left L5 nerve root, bilateral decompressive laminectomy L3-4/notes 10/30/2016   ULNAR NERVE TRANSPOSITION Left 05/14/2014   Procedure: Left Ulnar nerve decompression;  Surgeon: Ashok Pall, MD;  Location: [place] NEURO ORS;  Service: Neurosurgery;  Laterality: Left;  Left Ulnar nerve decompression    I have reviewed the social history and family history with the patient and they are unchanged from previous note.  ALLERGIES:  is allergic to dulaglutide.  MEDICATIONS:  Current Outpatient Medications  Medication Sig Dispense Refill   ACCU-CHEK GUIDE test strip 1 each by Other route in the morning and at bedtime.     aspirin EC 81 MG tablet Take 1 tablet (81 mg total) by mouth daily. Swallow whole. 90 tablet 3   chlorthalidone (HYGROTON) 25 MG tablet Take 1 tablet (25 mg total) by mouth daily. 90 tablet 1   ergocalciferol (VITAMIN D2) 50000 UNITS capsule Take 50,000 Units by mouth every Saturday. On Saturday.     FARXIGA 10 MG TABS tablet Take 10 mg by mouth every morning.     gabapentin (NEURONTIN) 300 MG capsule Take 300 mg by mouth as needed.     HUMALOG 100 UNIT/ML injection      hydrALAZINE (APRESOLINE) 100 MG tablet TAKE ONE TABLET BY MOUTH EVERY MORNING and TAKE ONE TABLET BY MOUTH AT NOON and TAKE ONE TABLET BY MOUTH EVERYDAY AT BEDTIME 270 tablet 1   Insulin Disposable Pump (V-GO 40) KIT Inject 1 application into the skin See admin instructions.     irbesartan (AVAPRO) 300 MG tablet Take 1 tablet (300 mg total) by mouth daily. 90 tablet 1   isosorbide mononitrate (IMDUR) 60 MG 24 hr tablet  TAKE 1 & 1/2 (ONE & ONE-HALF) TABLETS BY MOUTH ONCE DAILY 135 tablet 1   latanoprost (XALATAN) 0.005 % ophthalmic solution Place 1 drop into both eyes at bedtime.      metoprolol succinate (TOPROL-XL) 100 MG 24 hr tablet TAKE ONE TABLET BY MOUTH EVERYDAY AT BEDTIME 90 tablet 2   montelukast (SINGULAIR) 10 MG tablet Take 10 mg by mouth daily.     nitroGLYCERIN (NITROSTAT) 0.4 MG SL tablet Place 1 tablet (0.4 mg total) under the tongue every 5 (five) minutes as needed for chest pain. 25 tablet 1   OZEMPIC, 1 MG/DOSE, 4 MG/3ML SOPN Inject 1 mg into the skin once a week.     pantoprazole (PROTONIX) 40 MG tablet TAKE ONE TABLET BY MOUTH ONCE daily 90 tablet 0   Probiotic Product (PROBIOTIC ADVANCED PO) Take 1 capsule by mouth daily.     rosuvastatin (CRESTOR) 40 MG tablet TAKE ONE TABLET BY MOUTH ONCE DAILY 90 tablet 3   spironolactone (ALDACTONE) 50 MG tablet Take 1 tablet (50 mg total) by mouth daily. 90 tablet 1   No current facility-administered medications for this visit.    PHYSICAL EXAMINATION: ECOG PERFORMANCE STATUS: 1 - Symptomatic but completely ambulatory  Vitals:   03/26/22 1026  BP: (!) 110/46  Pulse: 62  Resp: 18  Temp: 98.7 F (37.1 C)  SpO2: 100%   Wt Readings from Last 3 Encounters:  03/26/22 176 lb 11.2 oz (80.2 kg)  01/17/22 172 lb (78 kg)  05/30/21 163 lb 12.8 oz (74.3 kg)     GENERAL:alert, no distress and comfortable SKIN: skin color, texture, turgor are normal, no rashes or significant lesions EYES: normal, Conjunctiva are pink and non-injected, sclera clear  NECK: supple, thyroid normal size, non-tender, without nodularity LYMPH:  no palpable lymphadenopathy in the cervical, axillary LUNGS: clear to auscultation and percussion with normal breathing effort HEART: regular rate & rhythm and no murmurs and no lower extremity edema ABDOMEN:abdomen soft, non-tender and normal bowel sounds Musculoskeletal:no cyanosis of digits and no clubbing  NEURO: alert &  oriented x 3 with fluent speech, no focal motor/sensory deficits BREAST: No palpable mass, nodules or adenopathy bilaterally. Breast exam benign.   LABORATORY DATA:  I have reviewed the data as listed  Latest Ref Rng & Units 03/23/2021    9:45 AM 03/23/2020   10:01 AM 03/09/2020   10:43 AM  CBC  WBC 4.0 - 10.5 K/uL 8.8  8.0  9.2   Hemoglobin 12.0 - 15.0 g/dL 11.7  13.1  13.1   Hematocrit 36.0 - 46.0 % 37.2  41.5  40.0   Platelets 150 - 400 K/uL 209  177  212         Latest Ref Rng & Units 03/23/2021   11:41 AM 03/23/2021   11:37 AM 03/23/2021    9:45 AM  CMP  Glucose 65 - 99 mg/dL 37  37  44   BUN 8 - 27 mg/dL 14  14  14    Creatinine 0.57 - 1.00 mg/dL 0.92  0.91  0.87   Sodium 134 - 144 mmol/L 146  149  144   Potassium 3.5 - 5.2 mmol/L 3.9  3.9  3.7   Chloride 96 - 106 mmol/L 105  106  109   CO2 20 - 29 mmol/L 26  26  27    Calcium 8.7 - 10.3 mg/dL 9.6  10.0  9.3   Total Protein 6.0 - 8.5 g/dL 6.8   6.6   Total Bilirubin 0.0 - 1.2 mg/dL 0.6   0.7   Alkaline Phos 44 - 121 IU/L 79   68   AST 0 - 40 IU/L 23   20   ALT 0 - 32 IU/L 18   19       RADIOGRAPHIC STUDIES: I have personally reviewed the radiological images as listed and agreed with the findings in the report. No results found.    No orders of the defined types were placed in this encounter.  All questions were answered. The patient knows to call the clinic with any problems, questions or concerns. No barriers to learning was detected. The total time spent in the appointment was 20 minutes.     Truitt Merle, MD 03/26/2022   I, Wilburn Mylar, am acting as scribe for Truitt Merle, MD.   I have reviewed the above documentation for accuracy and completeness, and I agree with the above.

## 2022-03-28 DIAGNOSIS — M25551 Pain in right hip: Secondary | ICD-10-CM | POA: Diagnosis not present

## 2022-03-28 DIAGNOSIS — M545 Low back pain, unspecified: Secondary | ICD-10-CM | POA: Diagnosis not present

## 2022-03-28 DIAGNOSIS — M6281 Muscle weakness (generalized): Secondary | ICD-10-CM | POA: Diagnosis not present

## 2022-04-01 NOTE — Progress Notes (Signed)
Office Visit Note   Patient: [name]           Date of Birth: 1946-03-25           MRN: [id number] Visit Date: 03/26/2019              Requested by: Laurey Morale, MD [place],  [place] [id number] PCP: Laurey Morale, MD   Assessment & Plan: Visit Diagnoses:  1. Chronic right SI joint pain   2. Chronic left SI joint pain     Plan: Patient follow-up with Dr. Louanne Skye in 5 weeks for recheck.  Follow-Up Instructions: Return in about 5 weeks (around 04/30/2019).   Orders:  No orders of the defined types were placed in this encounter.  No orders of the defined types were placed in this encounter.     Procedures: No procedures performed   Clinical Data: No additional findings.   Subjective: Chief Complaint  Patient presents with   Lower Back - Follow-up    HPI  Review of Systems   Objective: Vital Signs: There were no vitals taken for this visit.  Physical Exam  Ortho Exam  Specialty Comments:  No specialty comments available.  Imaging: No results found.   PMFS History: Patient Active Problem List   Diagnosis Date Noted   Asymptomatic varicose veins of both lower extremities 11/04/2020   Ductal carcinoma in situ (DCIS) of left breast 01/06/2020   Right-sided thoracic back pain 07/01/2019   Hyperlipidemia LDL goal <70 07/17/2017   History of lumbar spinal fusion    History of fusion of lumbar spine    Anemia due to blood loss 11/02/2016    Class: Acute   Lethargy 10/31/2016   AKI (acute kidney injury) ([place]) 10/31/2016   (HFpEF) heart failure with preserved ejection fraction ([place]) 10/31/2016   Cough    Leukocytosis    Spondylolisthesis, lumbar region 10/30/2016    Class: Chronic   Spinal stenosis of lumbar region 10/30/2016    Class: Chronic   Retained orthopedic hardware 10/30/2016    Class: Chronic   Spinal stenosis of lumbar region with neurogenic claudication 10/30/2016   Genetic testing 09/14/2015    Atypical ductal hyperplasia of left breast 08/24/2015   Family history of breast cancer    Family history of prostate cancer    Family history of stomach cancer    Abnormal nuclear stress test 12/29/2014   Cervical spondylosis with myelopathy and radiculopathy 06/11/2014   Carpal tunnel syndrome 05/14/2014   Aftercare following surgery of the circulatory system, NEC 03/11/2014   Pain of right lower extremity 03/11/2014   Atypical lobular hyperplasia of left breast 02/12/2014   Fibromyalgia 09/21/2013   Carotid artery disease ([place]) 02/29/2012   Back abscess 01/22/2012   Chest pain with moderate risk of acute coronary syndrome 12/09/2009   Shortness of breath 06/10/2009   Essential hypertension, malignant 03/21/2009   Orthostatic hypotension 01/31/2009   CAD (coronary artery disease) 12/23/2008   Normocytic anemia 07/21/2008   Headache(784.0) 07/21/2008   History of TIA (transient ischemic attack) 07/21/2008   Edema 02/27/2008   Diabetes mellitus with complication ([place]) 35/57/3220   Depression 07/16/2007   Dyslipidemia 02/21/2007   Resistant hypertension 02/21/2007   Asthma 02/21/2007   GERD 02/21/2007   LOW BACK PAIN 02/21/2007   Past Medical History:  Diagnosis Date   Abnormal nuclear stress test 12/29/2014   Allergy    Anemia, unspecified    Arthritis    Blood transfusion  without reported diagnosis    2018   CAD (coronary artery disease)    sees. Dr. Burt Knack. s/p cath with PCI of RCA (xience des) June 2010. Pt also with LAD and D2 dzs-NL FFR in both areas med Rx   Carotid artery occlusion    Cataract    sees Dr. Herbert Deaner    Depressive disorder, not elsewhere classified    Duodenal nodule    Edema    Fibromyalgia    Gastric polyps    COLON   GERD (gastroesophageal reflux disease)    Glaucoma    narrow angle, sees Dr. Shelly Flatten)    Hearing loss    left ear   Heart murmur    questionable   Hemorrhoids    HSV (herpes simplex virus) anogenital  infection 05/2014   HTN (hypertension)    Hyperlipidemia    Low back pain    she sees Dr. Louanne Skye, also Dr. Ernestina Patches for pain medications and Benjiman Core PA for injections    Obesity    Osteopenia 01/2018   T score -1.6 FRAX 7.1% / 1.4%   Peripheral vascular disease ([place])    Resistant hypertension 02/21/2007   Qualifier: Diagnosis of  By: Tiney Rouge CMA, Ellison Hughs     Stroke [facility])    TIA  "many yrs ago"     TIA (transient ischemic attack)    also Hx of it.    Type II or unspecified type diabetes mellitus without mention of complication, not stated as uncontrolled    sees Dr. Carrolyn Meiers     Family History  Problem Relation Age of Onset   Hypertension Mother    Hypertension Sister    Hyperlipidemia Sister    Hypertension Brother    Cancer Brother        PROSTATE/LUNG   Diabetes Brother    Heart disease Brother        Before age 57 - Bypass   Varicose Veins Brother    Cancer Father        Prostate and pancreatic    Stomach cancer Maternal Grandmother 65   Heart disease Maternal Grandfather    Breast cancer Maternal Aunt 55   Cancer Maternal Aunt        Stomach   Breast cancer Cousin 68       maternal first cousin   Prostate cancer Cousin 63   Breast cancer Cousin 60   Esophageal cancer Cousin    Breast cancer Cousin 32       maternal first cousin's daughter   Coronary artery disease Neg Hx        premature CAD   Colon cancer Neg Hx    Colon polyps Neg Hx    Rectal cancer Neg Hx     Past Surgical History:  Procedure Laterality Date   ABDOMINAL HYSTERECTOMY  age 56   TAH and USO  Leiomyomata   ANTERIOR CERVICAL CORPECTOMY N/A 06/11/2014   Procedure: ANTERIOR CERVICAL CORPECTOMY CERVICAL SIX; WITH CERVICAL FIVE TO CERVICAL SEVEN ARTHRODESIS;  Surgeon: Ashok Pall, MD;  Location: [facility];  Service: Neurosurgery;  Laterality: N/A;   BACK SURGERY  2008   lumb fusion   BREAST BIOPSY Left 01/26/2014   Procedure: EXCISION LEFT BREAST MASS;  Surgeon: Adin Hector, MD;   Location: [place];  Service: General;  Laterality: Left;   BREAST LUMPECTOMY WITH RADIOACTIVE SEED LOCALIZATION Left 07/24/2017   Procedure: LEFT BREAST LUMPECTOMY WITH RADIOACTIVE SEED LOCALIZATION ERAS;  Surgeon: Erroll Luna, MD;  Location: [place];  Service: General;  Laterality: Left;   BREAST LUMPECTOMY WITH RADIOACTIVE SEED LOCALIZATION Left 01/13/2020   Procedure: LEFT BREAST LUMPECTOMY WITH RADIOACTIVE SEED LOCALIZATION;  Surgeon: Erroll Luna, MD;  Location: [place];  Service: General;  Laterality: Left;   CARDIAC CATHETERIZATION     CARDIAC CATHETERIZATION N/A 12/29/2014   Procedure: Left Heart Cath and Coronary Angiography;  Surgeon: Sherren Mocha, MD;  Location: [place] CV LAB;  Service: Cardiovascular;  Laterality: N/A;   CAROTID ENDARTERECTOMY  march 2012   per Dr. Morton Amy   CARPAL TUNNEL RELEASE Left 05/14/2014   Procedure: Left Carpal tunnel release;  Surgeon: Ashok Pall, MD;  Location: [place] NEURO ORS;  Service: Neurosurgery;  Laterality: Left;  Left Carpal tunnel release   CARPAL TUNNEL RELEASE Bilateral    CHOLECYSTECTOMY     COLONOSCOPY  06/26/2017   per Dr. Henrene Pastor, adenomatous polyp, repeat in 5 yrs     CORONARY STENT PLACEMENT     Dr. Burt Knack   DILATION AND CURETTAGE OF UTERUS     ESOPHAGOGASTRODUODENOSCOPY  02-08-06   per Dr. Henrene Pastor, gastritis    EYE SURGERY Left June 2016   Cataract   GLAUCOMA SURGERY     with laser, per Dr. Henrene Pastor    HARDWARE REMOVAL  10/30/2016   Procedure: HARDWARE REMOVAL;  Surgeon: Jessy Oto, MD;  Location: [place];  Service: Orthopedics;;   LUMBAR FUSION  2008   POLYPECTOMY     POSTERIOR LUMBAR FUSION  10/30/2016   Removal of hardware rods and pedicle screws lumbar four, Extension of fusion L4-5 to L5-S1 with reinsertion of screws at L 4, left L5-S1 transforaminal lumbar interbody fusion, Exploration  left L5 nerve root, bilateral decompressive laminectomy L3-4/notes 10/30/2016    ULNAR NERVE TRANSPOSITION Left 05/14/2014   Procedure: Left Ulnar nerve decompression;  Surgeon: Ashok Pall, MD;  Location: [place] NEURO ORS;  Service: Neurosurgery;  Laterality: Left;  Left Ulnar nerve decompression   Social History   Occupational History   Not on file  Tobacco Use   Smoking status: Former    Types: Cigarettes    Quit date: 01/22/1967    Years since quitting: 55.2   Smokeless tobacco: Never  Vaping Use   Vaping Use: Never used  Substance and Sexual Activity   Alcohol use: No    Alcohol/week: 0.0 standard drinks of alcohol   Drug use: No   Sexual activity: Not Currently    Birth control/protection: Surgical, Post-menopausal    Comment: HYST-1st intercourse 37 yo-5 partners

## 2022-04-06 ENCOUNTER — Telehealth: Payer: Self-pay | Admitting: Hematology

## 2022-04-06 NOTE — Telephone Encounter (Signed)
Scheduled follow-up appointment per 9/18 los. Patient is aware. 

## 2022-04-11 DIAGNOSIS — M25551 Pain in right hip: Secondary | ICD-10-CM | POA: Diagnosis not present

## 2022-04-11 DIAGNOSIS — M6281 Muscle weakness (generalized): Secondary | ICD-10-CM | POA: Diagnosis not present

## 2022-04-11 DIAGNOSIS — M545 Low back pain, unspecified: Secondary | ICD-10-CM | POA: Diagnosis not present

## 2022-04-13 ENCOUNTER — Other Ambulatory Visit (HOSPITAL_BASED_OUTPATIENT_CLINIC_OR_DEPARTMENT_OTHER): Payer: Self-pay | Admitting: Family

## 2022-04-13 NOTE — Telephone Encounter (Signed)
Rx request sent to pharmacy.  

## 2022-04-16 ENCOUNTER — Telehealth: Payer: Self-pay | Admitting: Pharmacist

## 2022-04-16 NOTE — Chronic Care Management (AMB) (Unsigned)
Chronic Care Management Pharmacy Assistant   Name: [name]  MRN: [id number] DOB: [birth year]  Reason for Encounter: Medication Review / Medication Coordination Call   Recent office visits:  None  Recent consult visits:  03/26/2022 Karen Merle MD (oncology) - Patient was seen for Ductal carcinoma in situ (DCIS) of left breast. No medication changes. No follow up noted.   Hospital visits:  None  Medications: Outpatient Encounter Medications as of 04/16/2022  Medication Sig   ACCU-CHEK GUIDE test strip 1 each by Other route in the morning and at bedtime.   aspirin EC 81 MG tablet Take 1 tablet (81 mg total) by mouth daily. Swallow whole.   chlorthalidone (HYGROTON) 25 MG tablet Take 1 tablet (25 mg total) by mouth daily.   ergocalciferol (VITAMIN D2) 50000 UNITS capsule Take 50,000 Units by mouth every Saturday. On Saturday.   FARXIGA 10 MG TABS tablet Take 10 mg by mouth every morning.   gabapentin (NEURONTIN) 300 MG capsule Take 300 mg by mouth as needed.   HUMALOG 100 UNIT/ML injection    hydrALAZINE (APRESOLINE) 100 MG tablet TAKE ONE TABLET BY MOUTH EVERY MORNING and TAKE ONE TABLET BY MOUTH AT NOON and TAKE ONE TABLET BY MOUTH EVERYDAY AT BEDTIME   Insulin Disposable Pump (V-GO 40) KIT Inject 1 application into the skin See admin instructions.   irbesartan (AVAPRO) 300 MG tablet TAKE ONE TABLET BY MOUTH EVERYDAY AT BEDTIME   isosorbide mononitrate (IMDUR) 60 MG 24 hr tablet TAKE 1 & 1/2 (ONE & ONE-HALF) TABLETS BY MOUTH ONCE DAILY   latanoprost (XALATAN) 0.005 % ophthalmic solution Place 1 drop into both eyes at bedtime.    metoprolol succinate (TOPROL-XL) 100 MG 24 hr tablet TAKE ONE TABLET BY MOUTH EVERYDAY AT BEDTIME   montelukast (SINGULAIR) 10 MG tablet Take 10 mg by mouth daily.   nitroGLYCERIN (NITROSTAT) 0.4 MG SL tablet Place 1 tablet (0.4 mg total) under the tongue every 5 (five) minutes as needed for chest pain.   OZEMPIC, 1 MG/DOSE, 4 MG/3ML SOPN Inject 1  mg into the skin once a week.   pantoprazole (PROTONIX) 40 MG tablet TAKE ONE TABLET BY MOUTH ONCE daily   Probiotic Product (PROBIOTIC ADVANCED PO) Take 1 capsule by mouth daily.   rosuvastatin (CRESTOR) 40 MG tablet TAKE ONE TABLET BY MOUTH ONCE DAILY   spironolactone (ALDACTONE) 50 MG tablet Take 1 tablet (50 mg total) by mouth daily.   No facility-administered encounter medications on file as of 04/16/2022.   Reviewed chart for medication changes ahead of medication coordination call.  BP Readings from Last 3 Encounters:  03/26/22 (!) 110/46  01/17/22 (!) 100/40  05/30/21 108/60    Lab Results  Component Value Date   HGBA1C 15.2 (H) 02/20/2019     Patient obtains medications through Adherence Packaging  30 Days    Last adherence delivery included:  Vitamin D2 50,000 units once weekly at breakfast on Sat. Spironolactone 50 mg 1 tablet at breakfast Rosuvastatin 40 mg 1 tablet at breakfast Isosorbide ER 60 mg 1.5 tablets at breakfast Montelukast 10 mg 1 tablet at bedtime Pantoprazole 40 mg 1 tablet daily Metoprolol 100 mg 1 tablet at bedtime Chlorthalidone 25 mg 1 tablet at breakfast Hydralazine 100 mg 1 tablet at breakfast, lunch and bedtime Irbesartan 300 mg 1 tablet at bedtime V-go 40 use as directed Aspirin 81 mg at breakfast (patient wants this in packs)   Patient declined  last month:    Patient is due  for next adherence delivery on: 04/26/2022   Called patient and reviewed medications and coordinated delivery.  This delivery to include: Vitamin D2 50,000 units once weekly at breakfast on Sat. Spironolactone 50 mg 1 tablet at breakfast Rosuvastatin 40 mg 1 tablet at breakfast Isosorbide ER 60 mg 1.5 tablets at breakfast Montelukast 10 mg 1 tablet at bedtime Pantoprazole 40 mg 1 tablet daily at breakfast Metoprolol 100 mg 1 tablet at bedtime Chlorthalidone 25 mg 1 tablet at breakfast Hydralazine 100 mg 1 tablet at breakfast, lunch and bedtime Irbesartan 300 mg  1 tablet at bedtime V-go 40 use as directed Aspirin 81 mg at breakfast    Patient will need a short fill: No short fills needed   Coordinated acute fill: No acute fills needed   Patient declined the following medications: No medications declined  Patient has requested all medications except Vgo40 to be in packaging   Confirmed delivery date of 04/26/2022, advised patient that pharmacy will contact them the morning of delivery.   Care Gaps: AWV - 11/16/2021 message to [name] Last BP - 1110/46 on 03/26/2022 Last A1C - 6.0 on 05/03/2021 Foot exam - never done Hep C Screen - never done Urine ACR - overdue HGA1C - overdue Covid - overdue Flu - due GFR - overdue  Star Rating Drugs: Rosuvastatin 40 mg - last filled 03/23/2022 30 [facility] Irbesartan 300 mg  - last filled 03/23/2022 30 DS at [facility] Farxiga 10 mg - last filled 08/17/2020 [facility] verified with [place] Pharmacist Assistant [phone]

## 2022-04-18 DIAGNOSIS — M25551 Pain in right hip: Secondary | ICD-10-CM | POA: Diagnosis not present

## 2022-04-18 DIAGNOSIS — M6281 Muscle weakness (generalized): Secondary | ICD-10-CM | POA: Diagnosis not present

## 2022-04-18 DIAGNOSIS — M545 Low back pain, unspecified: Secondary | ICD-10-CM | POA: Diagnosis not present

## 2022-04-25 DIAGNOSIS — M6281 Muscle weakness (generalized): Secondary | ICD-10-CM | POA: Diagnosis not present

## 2022-04-25 DIAGNOSIS — Z23 Encounter for immunization: Secondary | ICD-10-CM | POA: Diagnosis not present

## 2022-04-25 DIAGNOSIS — M25551 Pain in right hip: Secondary | ICD-10-CM | POA: Diagnosis not present

## 2022-04-25 DIAGNOSIS — M545 Low back pain, unspecified: Secondary | ICD-10-CM | POA: Diagnosis not present

## 2022-05-02 DIAGNOSIS — M25551 Pain in right hip: Secondary | ICD-10-CM | POA: Diagnosis not present

## 2022-05-02 DIAGNOSIS — M6281 Muscle weakness (generalized): Secondary | ICD-10-CM | POA: Diagnosis not present

## 2022-05-02 DIAGNOSIS — M545 Low back pain, unspecified: Secondary | ICD-10-CM | POA: Diagnosis not present

## 2022-05-04 ENCOUNTER — Encounter: Payer: Self-pay | Admitting: Internal Medicine

## 2022-05-07 DIAGNOSIS — M21161 Varus deformity, not elsewhere classified, right knee: Secondary | ICD-10-CM | POA: Diagnosis not present

## 2022-05-07 DIAGNOSIS — M5136 Other intervertebral disc degeneration, lumbar region: Secondary | ICD-10-CM | POA: Diagnosis not present

## 2022-05-07 DIAGNOSIS — M5134 Other intervertebral disc degeneration, thoracic region: Secondary | ICD-10-CM | POA: Diagnosis not present

## 2022-05-07 DIAGNOSIS — M4317 Spondylolisthesis, lumbosacral region: Secondary | ICD-10-CM | POA: Diagnosis not present

## 2022-05-07 DIAGNOSIS — M170 Bilateral primary osteoarthritis of knee: Secondary | ICD-10-CM | POA: Diagnosis not present

## 2022-05-07 DIAGNOSIS — Z981 Arthrodesis status: Secondary | ICD-10-CM | POA: Diagnosis not present

## 2022-05-07 DIAGNOSIS — M4316 Spondylolisthesis, lumbar region: Secondary | ICD-10-CM | POA: Diagnosis not present

## 2022-05-07 DIAGNOSIS — M5030 Other cervical disc degeneration, unspecified cervical region: Secondary | ICD-10-CM | POA: Diagnosis not present

## 2022-05-09 DIAGNOSIS — M545 Low back pain, unspecified: Secondary | ICD-10-CM | POA: Diagnosis not present

## 2022-05-09 DIAGNOSIS — M25551 Pain in right hip: Secondary | ICD-10-CM | POA: Diagnosis not present

## 2022-05-09 DIAGNOSIS — M6281 Muscle weakness (generalized): Secondary | ICD-10-CM | POA: Diagnosis not present

## 2022-05-11 DIAGNOSIS — Z23 Encounter for immunization: Secondary | ICD-10-CM | POA: Diagnosis not present

## 2022-05-13 ENCOUNTER — Other Ambulatory Visit: Payer: Self-pay | Admitting: Cardiovascular Disease

## 2022-05-13 ENCOUNTER — Other Ambulatory Visit (HOSPITAL_BASED_OUTPATIENT_CLINIC_OR_DEPARTMENT_OTHER): Payer: Self-pay | Admitting: Family

## 2022-05-14 ENCOUNTER — Other Ambulatory Visit (HOSPITAL_BASED_OUTPATIENT_CLINIC_OR_DEPARTMENT_OTHER): Payer: Self-pay | Admitting: Family

## 2022-05-14 NOTE — Telephone Encounter (Signed)
Rx request sent to pharmacy.  

## 2022-05-15 ENCOUNTER — Telehealth: Payer: Self-pay | Admitting: Pharmacist

## 2022-05-15 DIAGNOSIS — M6281 Muscle weakness (generalized): Secondary | ICD-10-CM | POA: Diagnosis not present

## 2022-05-15 DIAGNOSIS — M25551 Pain in right hip: Secondary | ICD-10-CM | POA: Diagnosis not present

## 2022-05-15 DIAGNOSIS — M545 Low back pain, unspecified: Secondary | ICD-10-CM | POA: Diagnosis not present

## 2022-05-15 NOTE — Chronic Care Management (AMB) (Unsigned)
Chronic Care Management Pharmacy Assistant   Name: [name]  MRN: [id number] DOB: 1945-11-10  Reason for Encounter: Medication Review / Medication Coordination Call   Recent office visits:  None  Recent consult visits:  05/07/2022 Karen East, MD ([place] [facility]) - Patient was seen for s/p lumbar fusion. No medication changes. Follow up in 3 months.   Hospital visits:  None  Medications: Outpatient Encounter Medications as of 05/15/2022  Medication Sig   ACCU-CHEK GUIDE test strip 1 each by Other route in the morning and at bedtime.   aspirin EC 81 MG tablet Take 1 tablet (81 mg total) by mouth daily. Swallow whole.   chlorthalidone (HYGROTON) 25 MG tablet Take 1 tablet (25 mg total) by mouth daily. Please schedule appointment with Dr. Oval Linsey for refills. 1st attempt   ergocalciferol (VITAMIN D2) 50000 UNITS capsule Take 50,000 Units by mouth every Saturday. On Saturday.   FARXIGA 10 MG TABS tablet Take 10 mg by mouth every morning.   gabapentin (NEURONTIN) 300 MG capsule Take 300 mg by mouth as needed.   HUMALOG 100 UNIT/ML injection    hydrALAZINE (APRESOLINE) 100 MG tablet TAKE ONE TABLET BY MOUTH EVERY MORNING and TAKE ONE TABLET BY MOUTH AT NOON and TAKE ONE TABLET BY MOUTH EVERYDAY AT BEDTIME   Insulin Disposable Pump (V-GO 40) KIT Inject 1 application into the skin See admin instructions.   irbesartan (AVAPRO) 300 MG tablet TAKE ONE TABLET BY MOUTH EVERYDAY AT BEDTIME   isosorbide mononitrate (IMDUR) 60 MG 24 hr tablet TAKE 1 AND 1/2 TABLETS BY MOUTH ONCE DAILY   latanoprost (XALATAN) 0.005 % ophthalmic solution Place 1 drop into both eyes at bedtime.    metoprolol succinate (TOPROL-XL) 100 MG 24 hr tablet TAKE ONE TABLET BY MOUTH EVERYDAY AT BEDTIME   montelukast (SINGULAIR) 10 MG tablet Take 10 mg by mouth daily.   nitroGLYCERIN (NITROSTAT) 0.4 MG SL tablet Place 1 tablet (0.4 mg total) under the tongue every 5 (five) minutes as needed for chest  pain.   OZEMPIC, 1 MG/DOSE, 4 MG/3ML SOPN Inject 1 mg into the skin once a week.   pantoprazole (PROTONIX) 40 MG tablet TAKE ONE TABLET BY MOUTH ONCE daily   Probiotic Product (PROBIOTIC ADVANCED PO) Take 1 capsule by mouth daily.   rosuvastatin (CRESTOR) 40 MG tablet TAKE ONE TABLET BY MOUTH ONCE DAILY   spironolactone (ALDACTONE) 50 MG tablet TAKE ONE TABLET BY MOUTH ONCE DAILY   No facility-administered encounter medications on file as of 05/15/2022.   Reviewed chart for medication changes ahead of medication coordination call.  No OVs, Consults, or hospital visits since last care coordination call/Pharmacist visit. (If appropriate, list visit date, provider name)  No medication changes indicated OR if recent visit, treatment plan here.  BP Readings from Last 3 Encounters:  03/26/22 (!) 110/46  01/17/22 (!) 100/40  05/30/21 108/60    Lab Results  Component Value Date   HGBA1C 15.2 (H) 02/20/2019     Patient obtains medications through Adherence Packaging  30 Days    Last adherence delivery included:  Vitamin D2 50,000 units once weekly at breakfast on Sat. Spironolactone 50 mg 1 tablet at breakfast Rosuvastatin 40 mg 1 tablet at breakfast Isosorbide ER 60 mg 1.5 tablets at breakfast Montelukast 10 mg 1 tablet at bedtime Pantoprazole 40 mg 1 tablet daily at breakfast Metoprolol 100 mg 1 tablet at bedtime Chlorthalidone 25 mg 1 tablet at breakfast Hydralazine 100 mg 1 tablet at breakfast, lunch and  bedtime Irbesartan 300 mg 1 tablet at bedtime V-go 40 use as directed Aspirin 81 mg at breakfast    Patient declined  last month: No medications declined   Patient is due for next adherence delivery on: 05/28/2022   Called patient and reviewed medications and coordinated delivery.   This delivery to include: Vitamin D2 50,000 units once weekly at breakfast on Sat. Spironolactone 50 mg 1 tablet at breakfast Rosuvastatin 40 mg 1 tablet at breakfast Isosorbide ER 60 mg 1.5  tablets at breakfast Montelukast 10 mg 1 tablet at bedtime Pantoprazole 40 mg 1 tablet daily at breakfast Metoprolol 100 mg 1 tablet at bedtime Chlorthalidone 25 mg 1 tablet at breakfast Hydralazine 100 mg 1 tablet at breakfast, lunch and bedtime Irbesartan 300 mg 1 tablet at bedtime V-go 40 use as directed Aspirin 81 mg at breakfast    Patient will need a short fill: No short fills needed   Coordinated acute fill: No acute fills needed   Patient declined the following medications: No medications declined   Unable to reach patient to confirmed delivery date of 05/28/2022.  Care Gaps: AWV - 11/16/2021 message to [name] Last BP - 110/46 on 03/26/2022 Last A1C - 6.0 on 05/03/2021 AWV - never done Foot exam - never done Hep C Screen - never done Urine ACR - overdue HGA1C - overdue Covid  - overdue Shingrix - overdue Flu - due GFR - overdue Eye exam - overdue  Star Rating Drugs: Rosuvastatin 40 mg - last filled 04/23/2022 30 DS at [facility] Ozempic 1 mg/dose - last filled 03/04/2021 28 DS at [facility] verified with pharm tech Irbesartan 300 mg  - last filled 04/23/2022 30 DS at [facility] Farxiga 10 mg - last filled 11/18/2020 [facility] verified with [place] Pharmacist Assistant [phone]

## 2022-05-21 ENCOUNTER — Other Ambulatory Visit: Payer: Self-pay | Admitting: Family Medicine

## 2022-05-21 DIAGNOSIS — K219 Gastro-esophageal reflux disease without esophagitis: Secondary | ICD-10-CM

## 2022-05-22 DIAGNOSIS — M25551 Pain in right hip: Secondary | ICD-10-CM | POA: Diagnosis not present

## 2022-05-22 DIAGNOSIS — M545 Low back pain, unspecified: Secondary | ICD-10-CM | POA: Diagnosis not present

## 2022-05-22 DIAGNOSIS — M6281 Muscle weakness (generalized): Secondary | ICD-10-CM | POA: Diagnosis not present

## 2022-05-25 DIAGNOSIS — H9202 Otalgia, left ear: Secondary | ICD-10-CM | POA: Diagnosis not present

## 2022-05-25 DIAGNOSIS — H9042 Sensorineural hearing loss, unilateral, left ear, with unrestricted hearing on the contralateral side: Secondary | ICD-10-CM | POA: Diagnosis not present

## 2022-06-05 DIAGNOSIS — D649 Anemia, unspecified: Secondary | ICD-10-CM | POA: Diagnosis not present

## 2022-06-05 DIAGNOSIS — N200 Calculus of kidney: Secondary | ICD-10-CM | POA: Diagnosis not present

## 2022-06-05 DIAGNOSIS — Z853 Personal history of malignant neoplasm of breast: Secondary | ICD-10-CM | POA: Diagnosis not present

## 2022-06-05 DIAGNOSIS — N1831 Chronic kidney disease, stage 3a: Secondary | ICD-10-CM | POA: Diagnosis not present

## 2022-06-05 DIAGNOSIS — I2510 Atherosclerotic heart disease of native coronary artery without angina pectoris: Secondary | ICD-10-CM | POA: Diagnosis not present

## 2022-06-05 DIAGNOSIS — E669 Obesity, unspecified: Secondary | ICD-10-CM | POA: Diagnosis not present

## 2022-06-05 DIAGNOSIS — Z794 Long term (current) use of insulin: Secondary | ICD-10-CM | POA: Diagnosis not present

## 2022-06-05 DIAGNOSIS — Z8673 Personal history of transient ischemic attack (TIA), and cerebral infarction without residual deficits: Secondary | ICD-10-CM | POA: Diagnosis not present

## 2022-06-05 DIAGNOSIS — I129 Hypertensive chronic kidney disease with stage 1 through stage 4 chronic kidney disease, or unspecified chronic kidney disease: Secondary | ICD-10-CM | POA: Diagnosis not present

## 2022-06-05 DIAGNOSIS — E1159 Type 2 diabetes mellitus with other circulatory complications: Secondary | ICD-10-CM | POA: Diagnosis not present

## 2022-06-05 DIAGNOSIS — M5416 Radiculopathy, lumbar region: Secondary | ICD-10-CM | POA: Diagnosis not present

## 2022-06-05 DIAGNOSIS — I6523 Occlusion and stenosis of bilateral carotid arteries: Secondary | ICD-10-CM | POA: Diagnosis not present

## 2022-06-06 DIAGNOSIS — M25551 Pain in right hip: Secondary | ICD-10-CM | POA: Diagnosis not present

## 2022-06-06 DIAGNOSIS — M545 Low back pain, unspecified: Secondary | ICD-10-CM | POA: Diagnosis not present

## 2022-06-06 DIAGNOSIS — M6281 Muscle weakness (generalized): Secondary | ICD-10-CM | POA: Diagnosis not present

## 2022-06-07 ENCOUNTER — Encounter: Payer: Self-pay | Admitting: Internal Medicine

## 2022-06-13 DIAGNOSIS — M6281 Muscle weakness (generalized): Secondary | ICD-10-CM | POA: Diagnosis not present

## 2022-06-13 DIAGNOSIS — M25551 Pain in right hip: Secondary | ICD-10-CM | POA: Diagnosis not present

## 2022-06-13 DIAGNOSIS — M545 Low back pain, unspecified: Secondary | ICD-10-CM | POA: Diagnosis not present

## 2022-06-14 DIAGNOSIS — M7061 Trochanteric bursitis, right hip: Secondary | ICD-10-CM | POA: Diagnosis not present

## 2022-06-14 DIAGNOSIS — M1611 Unilateral primary osteoarthritis, right hip: Secondary | ICD-10-CM | POA: Diagnosis not present

## 2022-06-14 DIAGNOSIS — M160 Bilateral primary osteoarthritis of hip: Secondary | ICD-10-CM | POA: Diagnosis not present

## 2022-06-22 DIAGNOSIS — M6281 Muscle weakness (generalized): Secondary | ICD-10-CM | POA: Diagnosis not present

## 2022-06-22 DIAGNOSIS — M25651 Stiffness of right hip, not elsewhere classified: Secondary | ICD-10-CM | POA: Diagnosis not present

## 2022-06-22 DIAGNOSIS — M7061 Trochanteric bursitis, right hip: Secondary | ICD-10-CM | POA: Diagnosis not present

## 2022-06-22 DIAGNOSIS — R2689 Other abnormalities of gait and mobility: Secondary | ICD-10-CM | POA: Diagnosis not present

## 2022-07-04 DIAGNOSIS — R2689 Other abnormalities of gait and mobility: Secondary | ICD-10-CM | POA: Diagnosis not present

## 2022-07-04 DIAGNOSIS — M25651 Stiffness of right hip, not elsewhere classified: Secondary | ICD-10-CM | POA: Diagnosis not present

## 2022-07-04 DIAGNOSIS — M6281 Muscle weakness (generalized): Secondary | ICD-10-CM | POA: Diagnosis not present

## 2022-07-04 DIAGNOSIS — M7061 Trochanteric bursitis, right hip: Secondary | ICD-10-CM | POA: Diagnosis not present

## 2022-07-06 DIAGNOSIS — M25651 Stiffness of right hip, not elsewhere classified: Secondary | ICD-10-CM | POA: Diagnosis not present

## 2022-07-06 DIAGNOSIS — M7061 Trochanteric bursitis, right hip: Secondary | ICD-10-CM | POA: Diagnosis not present

## 2022-07-06 DIAGNOSIS — M6281 Muscle weakness (generalized): Secondary | ICD-10-CM | POA: Diagnosis not present

## 2022-07-06 DIAGNOSIS — R2689 Other abnormalities of gait and mobility: Secondary | ICD-10-CM | POA: Diagnosis not present

## 2022-07-10 DIAGNOSIS — M25651 Stiffness of right hip, not elsewhere classified: Secondary | ICD-10-CM | POA: Diagnosis not present

## 2022-07-10 DIAGNOSIS — M7061 Trochanteric bursitis, right hip: Secondary | ICD-10-CM | POA: Diagnosis not present

## 2022-07-10 DIAGNOSIS — R2689 Other abnormalities of gait and mobility: Secondary | ICD-10-CM | POA: Diagnosis not present

## 2022-07-10 DIAGNOSIS — M6281 Muscle weakness (generalized): Secondary | ICD-10-CM | POA: Diagnosis not present

## 2022-07-11 ENCOUNTER — Telehealth: Payer: Self-pay

## 2022-07-11 NOTE — Telephone Encounter (Signed)
Patient is for recall colon on 08/14/2022 with Dr.Perry. She uses insulin pump managed by Dr.South. Please contact Dr.South for insulin pump instructions before upcoming pre-visit 07/18/2022.  Thanks, Eddith Mentor PV

## 2022-07-12 DIAGNOSIS — M7061 Trochanteric bursitis, right hip: Secondary | ICD-10-CM | POA: Diagnosis not present

## 2022-07-12 DIAGNOSIS — M6281 Muscle weakness (generalized): Secondary | ICD-10-CM | POA: Diagnosis not present

## 2022-07-12 DIAGNOSIS — R2689 Other abnormalities of gait and mobility: Secondary | ICD-10-CM | POA: Diagnosis not present

## 2022-07-12 DIAGNOSIS — M25651 Stiffness of right hip, not elsewhere classified: Secondary | ICD-10-CM | POA: Diagnosis not present

## 2022-07-12 NOTE — Telephone Encounter (Signed)
Insulin pump letter faxed to Dr. Forde Dandy.

## 2022-07-13 ENCOUNTER — Other Ambulatory Visit (HOSPITAL_BASED_OUTPATIENT_CLINIC_OR_DEPARTMENT_OTHER): Payer: Self-pay | Admitting: Cardiovascular Disease

## 2022-07-13 ENCOUNTER — Telehealth: Payer: Self-pay | Admitting: Pharmacist

## 2022-07-13 NOTE — Telephone Encounter (Signed)
Rx request sent to pharmacy.  

## 2022-07-13 NOTE — Progress Notes (Signed)
Care Management & Coordination Services Pharmacy Team  Name: [name]  MRN: [id number] DOB: January 12, 1946  Reason for Encounter: Medication coordination and delivery  Contacted patient on 07/13/2022 to discuss medications   Recent office visits:  None  Recent consult visits:  None  Hospital visits:  None  Medications: Outpatient Encounter Medications as of 07/13/2022  Medication Sig   ACCU-CHEK GUIDE test strip 1 each by Other route in the morning and at bedtime.   aspirin EC 81 MG tablet Take 1 tablet (81 mg total) by mouth daily. Swallow whole.   chlorthalidone (HYGROTON) 25 MG tablet Take 1 tablet (25 mg total) by mouth daily. Please schedule appointment with Dr. Oval Linsey for refills. 1st attempt   ergocalciferol (VITAMIN D2) 50000 UNITS capsule Take 50,000 Units by mouth every Saturday. On Saturday.   FARXIGA 10 MG TABS tablet Take 10 mg by mouth every morning.   gabapentin (NEURONTIN) 300 MG capsule Take 300 mg by mouth as needed.   HUMALOG 100 UNIT/ML injection    hydrALAZINE (APRESOLINE) 100 MG tablet TAKE ONE TABLET BY MOUTH EVERY MORNING and TAKE ONE TABLET BY MOUTH AT NOON and TAKE ONE TABLET BY MOUTH EVERYDAY AT BEDTIME   Insulin Disposable Pump (V-GO 40) KIT Inject 1 application into the skin See admin instructions.   irbesartan (AVAPRO) 300 MG tablet TAKE ONE TABLET BY MOUTH EVERYDAY AT BEDTIME   isosorbide mononitrate (IMDUR) 60 MG 24 hr tablet TAKE 1 AND 1/2 TABLETS BY MOUTH ONCE DAILY   latanoprost (XALATAN) 0.005 % ophthalmic solution Place 1 drop into both eyes at bedtime.    metoprolol succinate (TOPROL-XL) 100 MG 24 hr tablet TAKE ONE TABLET BY MOUTH EVERYDAY AT BEDTIME   montelukast (SINGULAIR) 10 MG tablet Take 10 mg by mouth daily.   nitroGLYCERIN (NITROSTAT) 0.4 MG SL tablet Place 1 tablet (0.4 mg total) under the tongue every 5 (five) minutes as needed for chest pain.   OZEMPIC, 1 MG/DOSE, 4 MG/3ML SOPN Inject 1 mg into the skin once a week.    pantoprazole (PROTONIX) 40 MG tablet TAKE ONE TABLET BY MOUTH EVERY MORNING   Probiotic Product (PROBIOTIC ADVANCED PO) Take 1 capsule by mouth daily.   rosuvastatin (CRESTOR) 40 MG tablet TAKE ONE TABLET BY MOUTH ONCE DAILY   spironolactone (ALDACTONE) 50 MG tablet TAKE ONE TABLET BY MOUTH ONCE DAILY   No facility-administered encounter medications on file as of 07/13/2022.   BP Readings from Last 3 Encounters:  03/26/22 (!) 110/46  01/17/22 (!) 100/40  05/30/21 108/60    Pulse Readings from Last 3 Encounters:  03/26/22 62  01/17/22 66  05/30/21 66    Lab Results  Component Value Date/Time   HGBA1C 15.2 (H) 02/20/2019 10:22 AM   HGBA1C 9.3 (H) 03/27/2017 04:15 PM   Lab Results  Component Value Date   CREATININE 0.92 03/23/2021   BUN 14 03/23/2021   GFR 68.46 02/20/2019   GFRNONAA >60 03/23/2021   GFRAA 59 (L) 03/23/2020   NA 146 (H) 03/23/2021   K 3.9 03/23/2021   CALCIUM 9.6 03/23/2021   CO2 26 03/23/2021   Last adherence delivery date:06/21/2022     Patient is due for next adherence delivery on: 07/26/2022  Spoke with patient on 07/13/2022 reviewed medications and coordinated delivery.  This delivery to include: Adherence Packaging  30 Days  Vitamin D2 50,000 units once weekly at breakfast on Sat. Spironolactone 50 mg 1 tablet at breakfast Rosuvastatin 40 mg 1 tablet at breakfast Isosorbide ER 60  mg 1.5 tablets at breakfast Montelukast 10 mg 1 tablet at bedtime Pantoprazole 40 mg 1 tablet daily at breakfast Metoprolol 100 mg 1 tablet at bedtime Chlorthalidone 25 mg 1 tablet at breakfast Hydralazine 100 mg 1 tablet at breakfast, lunch and bedtime Irbesartan 300 mg 1 tablet at bedtime Aspirin 81 mg at breakfast  Patient declined the following medications this month: V-go 40 use as directed, patient states she has plenty on hand for next month.   Confirmed delivery date of 07/13/2022, advised patient that pharmacy will contact them the morning of  delivery.  Cycle dispensing form sent to Jeni Salles for review.  Care Gaps: AWV - 11/16/2021 message to [name] Last BP - 110/46 on 03/26/2022 Last A1C - 6.0 on 05/03/2021 AWV - never done Foot exam - never done Hep C Screen - never done Urine ACR - overdue HGA1C - overdue Shingrix - overdue Flu - due Covid - overdue eGFR - overdue Eye exam - overdue Colonoscopy - overdue  Star Rating Drugs: Rosuvastatin 40 mg - last filled 06/21/2022 30 DS [facility] Irbesartan 300 mg  - last filled 06/21/2022 30 DS at [facility] Farxiga 10 mg - last filled 11/18/2020 [facility] verified with pharmacy Ozempic 1 mg/dose - last filled 03/04/2021 28 DS at [facility] verified with [place] Pharmacist Assistant [phone]

## 2022-07-17 DIAGNOSIS — M545 Low back pain, unspecified: Secondary | ICD-10-CM | POA: Diagnosis not present

## 2022-07-17 DIAGNOSIS — M7062 Trochanteric bursitis, left hip: Secondary | ICD-10-CM | POA: Diagnosis not present

## 2022-07-17 DIAGNOSIS — M7061 Trochanteric bursitis, right hip: Secondary | ICD-10-CM | POA: Diagnosis not present

## 2022-07-18 ENCOUNTER — Telehealth: Payer: Self-pay

## 2022-07-18 ENCOUNTER — Ambulatory Visit (AMBULATORY_SURGERY_CENTER): Payer: Medicare Other

## 2022-07-18 VITALS — Ht 63.0 in | Wt 174.0 lb

## 2022-07-18 DIAGNOSIS — Z1211 Encounter for screening for malignant neoplasm of colon: Secondary | ICD-10-CM

## 2022-07-18 DIAGNOSIS — Z800 Family history of malignant neoplasm of digestive organs: Secondary | ICD-10-CM

## 2022-07-18 MED ORDER — NA SULFATE-K SULFATE-MG SULF 17.5-3.13-1.6 GM/177ML PO SOLN: 1.0000 | Freq: Once | ORAL | 0 refills | Status: AC

## 2022-07-18 NOTE — Telephone Encounter (Signed)
Left patient a message that we had an appt. In pre visit at 10:30 and I would call back in 72mn.to try and reach her.  Reached patient at 10:38. Patient came in person for visit

## 2022-07-18 NOTE — Telephone Encounter (Signed)
Lm on vm 

## 2022-07-18 NOTE — Progress Notes (Signed)

## 2022-07-24 DIAGNOSIS — R2689 Other abnormalities of gait and mobility: Secondary | ICD-10-CM | POA: Diagnosis not present

## 2022-07-24 DIAGNOSIS — M6281 Muscle weakness (generalized): Secondary | ICD-10-CM | POA: Diagnosis not present

## 2022-07-24 DIAGNOSIS — M25651 Stiffness of right hip, not elsewhere classified: Secondary | ICD-10-CM | POA: Diagnosis not present

## 2022-07-24 DIAGNOSIS — M7061 Trochanteric bursitis, right hip: Secondary | ICD-10-CM | POA: Diagnosis not present

## 2022-07-25 ENCOUNTER — Other Ambulatory Visit: Payer: Self-pay | Admitting: Orthopedic Surgery

## 2022-07-25 DIAGNOSIS — M25551 Pain in right hip: Secondary | ICD-10-CM

## 2022-07-25 DIAGNOSIS — H9202 Otalgia, left ear: Secondary | ICD-10-CM | POA: Diagnosis not present

## 2022-07-25 DIAGNOSIS — M545 Low back pain, unspecified: Secondary | ICD-10-CM

## 2022-07-25 NOTE — Telephone Encounter (Signed)
Lm on vm 

## 2022-07-26 DIAGNOSIS — R2689 Other abnormalities of gait and mobility: Secondary | ICD-10-CM | POA: Diagnosis not present

## 2022-07-26 DIAGNOSIS — M7061 Trochanteric bursitis, right hip: Secondary | ICD-10-CM | POA: Diagnosis not present

## 2022-07-26 DIAGNOSIS — M6281 Muscle weakness (generalized): Secondary | ICD-10-CM | POA: Diagnosis not present

## 2022-07-26 DIAGNOSIS — M25651 Stiffness of right hip, not elsewhere classified: Secondary | ICD-10-CM | POA: Diagnosis not present

## 2022-07-31 ENCOUNTER — Ambulatory Visit (INDEPENDENT_AMBULATORY_CARE_PROVIDER_SITE_OTHER): Payer: PPO | Admitting: Family Medicine

## 2022-07-31 ENCOUNTER — Encounter: Payer: Self-pay | Admitting: Family Medicine

## 2022-07-31 ENCOUNTER — Ambulatory Visit: Payer: Medicare Other

## 2022-07-31 VITALS — BP 90/40 | HR 67 | Temp 97.8°F | Ht 63.0 in | Wt 174.4 lb

## 2022-07-31 DIAGNOSIS — R040 Epistaxis: Secondary | ICD-10-CM | POA: Diagnosis not present

## 2022-07-31 DIAGNOSIS — J069 Acute upper respiratory infection, unspecified: Secondary | ICD-10-CM | POA: Diagnosis not present

## 2022-07-31 LAB — POC COVID19 BINAXNOW: SARS Coronavirus 2 Ag: NEGATIVE

## 2022-07-31 NOTE — Progress Notes (Signed)
[facility] [address] [name] [address] Dept: [phone] Dept Fax: [phone]  Office Visit  Subjective:    Patient ID: [name], female    DOB: Jul 27, 1945, 77 y.o..   MRN: [id number]  Chief Complaint  Patient presents with   Acute Visit    C/o cough, stuffy nose x 2 days.  Nose bleeds.  No OTC meds taken.  No home covid test taken.      History of Present Illness:  Patient is in today with a 2-day history of nasal congestion and cough. She notes she has also been having some nose bleeds since this started. Her nose bled for quite a while last night. She admits to some fears about this, due to a history of severe nosebleeds 12 years ago, which required multiple cautery treatments. She has not been running fever. She is not feeling short of breath.  Past Medical History: Patient Active Problem List   Diagnosis Date Noted   Asymptomatic varicose veins of both lower extremities 11/04/2020   Ductal carcinoma in situ (DCIS) of left breast 01/06/2020   Right-sided thoracic back pain 07/01/2019   Hyperlipidemia LDL goal <70 07/17/2017   History of lumbar spinal fusion    History of fusion of lumbar spine    Anemia due to blood loss 11/02/2016    Class: Acute   Lethargy 10/31/2016   AKI (acute kidney injury) ([facility]) 10/31/2016   (HFpEF) heart failure with preserved ejection fraction ([place] [facility]) 10/31/2016   Cough    Leukocytosis    Spondylolisthesis, lumbar region 10/30/2016    Class: Chronic   Spinal stenosis of lumbar region 10/30/2016    Class: Chronic   Retained orthopedic hardware 10/30/2016    Class: Chronic   Spinal stenosis of lumbar region with neurogenic claudication 10/30/2016   Genetic testing 09/14/2015   Atypical ductal hyperplasia of left breast 08/24/2015   Family history of breast cancer    Family history of prostate cancer    Family history of stomach cancer    Abnormal nuclear stress test  12/29/2014   Cervical spondylosis with myelopathy and radiculopathy 06/11/2014   Carpal tunnel syndrome 05/14/2014   Aftercare following surgery of the circulatory system, NEC 03/11/2014   Pain of right lower extremity 03/11/2014   Atypical lobular hyperplasia of left breast 02/12/2014   Fibromyalgia 09/21/2013   Carotid artery disease ([facility]) 02/29/2012   Back abscess 01/22/2012   Chest pain with moderate risk of acute coronary syndrome 12/09/2009   Shortness of breath 06/10/2009   Essential hypertension, malignant 03/21/2009   Orthostatic hypotension 01/31/2009   CAD (coronary artery disease) 12/23/2008   Normocytic anemia 07/21/2008   Headache(784.0) 07/21/2008   History of TIA (transient ischemic attack) 07/21/2008   Edema 02/27/2008   Diabetes mellitus with complication ([facility]) 91/63/8466   Depression 07/16/2007   Dyslipidemia 02/21/2007   Resistant hypertension 02/21/2007   Asthma 02/21/2007   GERD 02/21/2007   LOW BACK PAIN 02/21/2007   Past Surgical History:  Procedure Laterality Date   ABDOMINAL HYSTERECTOMY  age 5   TAH and USO  Leiomyomata   ANTERIOR CERVICAL CORPECTOMY N/A 06/11/2014   Procedure: ANTERIOR CERVICAL CORPECTOMY CERVICAL SIX; WITH CERVICAL FIVE TO CERVICAL SEVEN ARTHRODESIS;  Surgeon: Ashok Pall, MD;  Location: [facility];  Service: Neurosurgery;  Laterality: N/A;   BACK SURGERY  2008   lumb fusion   BREAST BIOPSY Left 01/26/2014   Procedure: EXCISION LEFT BREAST MASS;  Surgeon: Edsel Petrin  Dalbert Batman, MD;  Location: [facility];  Service: General;  Laterality: Left;   BREAST LUMPECTOMY WITH RADIOACTIVE SEED LOCALIZATION Left 07/24/2017   Procedure: LEFT BREAST LUMPECTOMY WITH RADIOACTIVE SEED LOCALIZATION ERAS;  Surgeon: Erroll Luna, MD;  Location: [facility];  Service: General;  Laterality: Left;   BREAST LUMPECTOMY WITH RADIOACTIVE SEED LOCALIZATION Left 01/13/2020   Procedure: LEFT BREAST LUMPECTOMY WITH RADIOACTIVE SEED  LOCALIZATION;  Surgeon: Erroll Luna, MD;  Location: [facility];  Service: General;  Laterality: Left;   CARDIAC CATHETERIZATION     CARDIAC CATHETERIZATION N/A 12/29/2014   Procedure: Left Heart Cath and Coronary Angiography;  Surgeon: Sherren Mocha, MD;  Location: [facility];  Service: Cardiovascular;  Laterality: N/A;   CAROTID ENDARTERECTOMY  march 2012   per Dr. Morton Amy   CARPAL TUNNEL RELEASE Left 05/14/2014   Procedure: Left Carpal tunnel release;  Surgeon: Ashok Pall, MD;  Location: [facility];  Service: Neurosurgery;  Laterality: Left;  Left Carpal tunnel release   CARPAL TUNNEL RELEASE Bilateral    CHOLECYSTECTOMY     COLONOSCOPY  06/26/2017   per Dr. Henrene Pastor, adenomatous polyp, repeat in 5 yrs     CORONARY STENT PLACEMENT     Dr. Burt Knack   DILATION AND CURETTAGE OF UTERUS     ESOPHAGOGASTRODUODENOSCOPY  02-08-06   per Dr. Henrene Pastor, gastritis    EYE SURGERY Left June 2016   Cataract   GLAUCOMA SURGERY     with laser, per Dr. Henrene Pastor    HARDWARE REMOVAL  10/30/2016   Procedure: HARDWARE REMOVAL;  Surgeon: Jessy Oto, MD;  Location: [facility];  Service: Orthopedics;;   LUMBAR FUSION  2008   POLYPECTOMY     POSTERIOR LUMBAR FUSION  10/30/2016   Removal of hardware rods and pedicle screws lumbar four, Extension of fusion L4-5 to L5-S1 with reinsertion of screws at L 4, left L5-S1 transforaminal lumbar interbody fusion, Exploration  left L5 nerve root, bilateral decompressive laminectomy L3-4/notes 10/30/2016   ULNAR NERVE TRANSPOSITION Left 05/14/2014   Procedure: Left Ulnar nerve decompression;  Surgeon: Ashok Pall, MD;  Location: [facility];  Service: Neurosurgery;  Laterality: Left;  Left Ulnar nerve decompression   Family History  Problem Relation Age of Onset   Hypertension Mother    Hypertension Sister    Hyperlipidemia Sister    Hypertension Brother    Cancer Brother        PROSTATE/LUNG   Diabetes Brother    Heart disease Brother         Before age 51 - [place]   Varicose Veins Brother    Cancer Father        Prostate and pancreatic    Stomach cancer Maternal Grandmother 65   Heart disease Maternal Grandfather    Breast cancer Maternal Aunt 55   Cancer Maternal Aunt        Stomach   Breast cancer Cousin 31       maternal first cousin   Prostate cancer Cousin 29   Breast cancer Cousin 60   Esophageal cancer Cousin    Breast cancer Cousin 41       maternal first cousin's daughter   Coronary artery disease Neg Hx        premature CAD   Colon cancer Neg Hx    Colon polyps Neg Hx    Rectal cancer Neg Hx    Outpatient Medications Prior to Visit  Medication Sig Dispense Refill  ACCU-CHEK GUIDE test strip 1 each by Other route in the morning and at bedtime.     aspirin EC 81 MG tablet Take 1 tablet (81 mg total) by mouth daily. Swallow whole. 90 tablet 3   chlorthalidone (HYGROTON) 25 MG tablet Take 1 tablet (25 mg total) by mouth daily. Please schedule appointment with Dr. Oval Linsey for refills. 1st attempt 90 tablet 0   ergocalciferol (VITAMIN D2) 50000 UNITS capsule Take 50,000 Units by mouth every Saturday. On Saturday.     FARXIGA 10 MG TABS tablet Take 10 mg by mouth every morning.     gabapentin (NEURONTIN) 300 MG capsule Take 300 mg by mouth as needed.     HUMALOG 100 UNIT/ML injection      hydrALAZINE (APRESOLINE) 100 MG tablet TAKE ONE TABLET BY MOUTH EVERY MORNING and TAKE ONE TABLET BY MOUTH AT NOON and TAKE ONE TABLET BY MOUTH EVERYDAY AT BEDTIME 270 tablet 1   Insulin Disposable Pump (V-GO 40) KIT Inject 1 application into the skin See admin instructions.     irbesartan (AVAPRO) 300 MG tablet TAKE ONE TABLET BY MOUTH EVERYDAY AT BEDTIME 90 tablet 1   isosorbide mononitrate (IMDUR) 60 MG 24 hr tablet TAKE 1 AND 1/2 TABLETS BY MOUTH ONCE DAILY 135 tablet 2   latanoprost (XALATAN) 0.005 % ophthalmic solution Place 1 drop into both eyes at bedtime.      metoprolol succinate (TOPROL-XL) 100 MG 24 hr tablet TAKE  ONE TABLET BY MOUTH EVERYDAY AT BEDTIME 90 tablet 2   montelukast (SINGULAIR) 10 MG tablet Take 10 mg by mouth daily.     nitroGLYCERIN (NITROSTAT) 0.4 MG SL tablet Place 1 tablet (0.4 mg total) under the tongue every 5 (five) minutes as needed for chest pain. 25 tablet 1   OZEMPIC, 1 MG/DOSE, 4 MG/3ML SOPN Inject 1 mg into the skin once a week.     pantoprazole (PROTONIX) 40 MG tablet TAKE ONE TABLET BY MOUTH EVERY MORNING 90 tablet 0   Probiotic Product (PROBIOTIC ADVANCED PO) Take 1 capsule by mouth daily.     rosuvastatin (CRESTOR) 40 MG tablet TAKE ONE TABLET BY MOUTH ONCE DAILY 90 tablet 3   spironolactone (ALDACTONE) 50 MG tablet TAKE ONE TABLET BY MOUTH ONCE DAILY 90 tablet 2   TYRVAYA 0.03 MG/ACT SOLN Place 1 spray into both nostrils 2 (two) times daily.     No facility-administered medications prior to visit.   Allergies  Allergen Reactions   Dulaglutide Nausea Only    Other reaction(s): made her sick Other reaction(s): made her sick     Objective:   Today's Vitals   07/31/22 1425  BP: (!) 90/40  Pulse: 67  Temp: 97.8 F (36.6 C)  TempSrc: Temporal  SpO2: 92%  Weight: 174 lb 6.4 oz (79.1 kg)  Height: '5\' 3"'$  (1.6 m)   Body mass index is 30.89 kg/m.   General: Well developed, well nourished. No acute distress. HEENT: Normocephalic, non-traumatic. Conjunctiva clear. External ears normal. EAC and TMs normal   bilaterally. Nose is moderately congested with mild rhinorrhea. There is a small, raw-appearing area in the left   nare laterally, which is not actively bleeding. Mucous membranes moist. Oropharynx clear. Good dentition. Neck: Supple. No lymphadenopathy. No thyromegaly. Lungs: Clear to auscultation bilaterally. No wheezing, rales or rhonchi. CV: RRR without murmurs or rubs. Pulses 2+ bilaterally. Psych: Alert and oriented. Normal mood and affect.  Health Maintenance Due  Topic Date Due   Medicare Annual Wellness (AWV)  Never  done   FOOT EXAM  Never done    Hepatitis C Screening  Never done   Diabetic kidney evaluation - Urine ACR  03/21/2013   HEMOGLOBIN A1C  08/23/2019   Zoster Vaccines- Shingrix (2 of 2) 11/12/2021   Diabetic kidney evaluation - eGFR measurement  03/23/2022   OPHTHALMOLOGY EXAM  04/18/2022   COLONOSCOPY (Pts 45-16yr Insurance coverage will need to be confirmed)  06/26/2022   Lab Results POCT Covid: Neg.    Assessment & Plan:   1. Viral URI with cough COVID testing is neg. I did not collect a swab for influenza, as I did not want to start her nose to bleeding again. Discussed home care for viral illness, including rest, pushing fluids, and OTC medications as needed for symptom relief. Recommend hot tea with honey for sore throat symptoms. She is not showing any signs of respiratory distress. Her mild hypoxia is likely due to her illness. I recommend she follow up with her PCP next week to reassess her oxygen level. Follow-up if needed for worsening or persistent symptoms.  - POC COVID-19  2. Epistaxis Currently no active bleeding. She should use salt water spray/drops to help hydration. She is well versed in management of an acute bleed. She should seek emergent care if acute bleeding is heavy or will not stop.  Return in about 1 week (around 08/07/2022) for Reassessment with PCP.   SHaydee Salter MD

## 2022-08-01 NOTE — Telephone Encounter (Signed)
Lm on vm that I received a message from [facility] at Dr. Baldwin Crown office that patient should stay on her current VGO dose.  Advised her to call with any questions.

## 2022-08-06 ENCOUNTER — Encounter: Payer: Self-pay | Admitting: Internal Medicine

## 2022-08-07 ENCOUNTER — Ambulatory Visit: Payer: PPO | Admitting: Family Medicine

## 2022-08-09 DIAGNOSIS — R2689 Other abnormalities of gait and mobility: Secondary | ICD-10-CM | POA: Diagnosis not present

## 2022-08-09 DIAGNOSIS — M6281 Muscle weakness (generalized): Secondary | ICD-10-CM | POA: Diagnosis not present

## 2022-08-09 DIAGNOSIS — M7061 Trochanteric bursitis, right hip: Secondary | ICD-10-CM | POA: Diagnosis not present

## 2022-08-09 DIAGNOSIS — M25651 Stiffness of right hip, not elsewhere classified: Secondary | ICD-10-CM | POA: Diagnosis not present

## 2022-08-10 ENCOUNTER — Other Ambulatory Visit: Payer: Self-pay

## 2022-08-10 ENCOUNTER — Telehealth: Payer: Self-pay | Admitting: *Deleted

## 2022-08-10 DIAGNOSIS — Z86010 Personal history of colonic polyps: Secondary | ICD-10-CM

## 2022-08-10 MED ORDER — NA SULFATE-K SULFATE-MG SULF 17.5-3.13-1.6 GM/177ML PO SOLN: 1.0000 | Freq: Once | ORAL | 0 refills | Status: AC

## 2022-08-10 NOTE — Telephone Encounter (Signed)
Spoke with patient at 4:25pm and let her know that the prep was sent into [facility].

## 2022-08-10 NOTE — Telephone Encounter (Signed)
Inbound call from patient needing prep medication sent into the pharmacy.  Please advise

## 2022-08-10 NOTE — Telephone Encounter (Signed)
attempted to reach patient. LM with call back #

## 2022-08-13 ENCOUNTER — Other Ambulatory Visit: Payer: Self-pay | Admitting: Family Medicine

## 2022-08-13 ENCOUNTER — Other Ambulatory Visit (HOSPITAL_BASED_OUTPATIENT_CLINIC_OR_DEPARTMENT_OTHER): Payer: Self-pay | Admitting: Cardiovascular Disease

## 2022-08-13 ENCOUNTER — Telehealth: Payer: Self-pay

## 2022-08-13 DIAGNOSIS — K219 Gastro-esophageal reflux disease without esophagitis: Secondary | ICD-10-CM

## 2022-08-13 NOTE — Progress Notes (Unsigned)
Care Management & Coordination Services Pharmacy Team  Reason for Encounter: Medication coordination and delivery  Contacted patient to discuss medications and coordinate delivery from [facility]. {US HC Outreach:28874}  Cycle dispensing form sent to *** for review.   Last adherence delivery date:07/26/2022      SCHEDULE FOLLOW UP Patient is due for next adherence delivery on: 08/24/2022  This delivery to include: Adherence Packaging  30 Days  Vitamin D2 50,000 units once weekly at breakfast on Sat. Spironolactone 50 mg 1 tablet at breakfast Rosuvastatin 40 mg 1 tablet at breakfast Isosorbide ER 60 mg 1.5 tablets at breakfast Montelukast 10 mg 1 tablet at bedtime Pantoprazole 40 mg 1 tablet daily at breakfast Metoprolol 100 mg 1 tablet at bedtime Chlorthalidone 25 mg 1 tablet at breakfast Hydralazine 100 mg 1 tablet at breakfast, lunch and bedtime Irbesartan 300 mg 1 tablet at bedtime Aspirin 81 mg at breakfast  Patient declined the following medications this month: V-go 40 use as directed, patient states she has plenty on hand for next month.   {Delivery DGUY:40347}  Any concerns about your medications? {yes/no:20286}  How often do you forget or accidentally miss a dose? {Missed doses:25554}  Is patient in packaging Yes  If yes  What is the date on your next pill pack?  Any concerns or issues with your packaging?  Recent blood pressure readings are as follows:***  Recent blood glucose readings are as follows:***  Chart review:  Recent office visits:  07/31/2022 Arlester Marker MD - Patient was seen for viral URI with cough and an additional concern. No medication changes.   Recent consult visits:  07/25/2022 Jarvis Morgan PA-C (ENT) - Patient was seen for left ear pain. No medication changes.   Hospital visits:  None  Medications: Outpatient Encounter Medications as of 08/13/2022  Medication Sig   ACCU-CHEK GUIDE test strip 1 each by Other route in the  morning and at bedtime.   aspirin EC 81 MG tablet Take 1 tablet (81 mg total) by mouth daily. Swallow whole.   chlorthalidone (HYGROTON) 25 MG tablet TAKE ONE TABLET BY MOUTH ONCE DAILY Please schedule an appt with Dr Oval Linsey for further refills.   ergocalciferol (VITAMIN D2) 50000 UNITS capsule Take 50,000 Units by mouth every Saturday. On Saturday.   FARXIGA 10 MG TABS tablet Take 10 mg by mouth every morning.   gabapentin (NEURONTIN) 300 MG capsule Take 300 mg by mouth as needed.   HUMALOG 100 UNIT/ML injection    hydrALAZINE (APRESOLINE) 100 MG tablet TAKE ONE TABLET BY MOUTH EVERY MORNING and TAKE ONE TABLET BY MOUTH AT NOON and TAKE ONE TABLET BY MOUTH EVERYDAY AT BEDTIME   Insulin Disposable Pump (V-GO 40) KIT Inject 1 application into the skin See admin instructions.   irbesartan (AVAPRO) 300 MG tablet TAKE ONE TABLET BY MOUTH EVERYDAY AT BEDTIME   isosorbide mononitrate (IMDUR) 60 MG 24 hr tablet TAKE 1 AND 1/2 TABLETS BY MOUTH ONCE DAILY   latanoprost (XALATAN) 0.005 % ophthalmic solution Place 1 drop into both eyes at bedtime.    metoprolol succinate (TOPROL-XL) 100 MG 24 hr tablet TAKE ONE TABLET BY MOUTH EVERYDAY AT BEDTIME   montelukast (SINGULAIR) 10 MG tablet Take 10 mg by mouth daily.   nitroGLYCERIN (NITROSTAT) 0.4 MG SL tablet Place 1 tablet (0.4 mg total) under the tongue every 5 (five) minutes as needed for chest pain.   OZEMPIC, 1 MG/DOSE, 4 MG/3ML SOPN Inject 1 mg into the skin once a week.  pantoprazole (PROTONIX) 40 MG tablet TAKE ONE TABLET BY MOUTH EVERY MORNING   Probiotic Product (PROBIOTIC ADVANCED PO) Take 1 capsule by mouth daily.   rosuvastatin (CRESTOR) 40 MG tablet TAKE ONE TABLET BY MOUTH ONCE DAILY   spironolactone (ALDACTONE) 50 MG tablet TAKE ONE TABLET BY MOUTH ONCE DAILY   TYRVAYA 0.03 MG/ACT SOLN Place 1 spray into both nostrils 2 (two) times daily.   No facility-administered encounter medications on file as of 08/13/2022.   BP Readings from Last 3  Encounters:  07/31/22 (!) 90/40  03/26/22 (!) 110/46  01/17/22 (!) 100/40    Pulse Readings from Last 3 Encounters:  07/31/22 67  03/26/22 62  01/17/22 66    Lab Results  Component Value Date/Time   HGBA1C 15.2 (H) 02/20/2019 10:22 AM   HGBA1C 9.3 (H) 03/27/2017 04:15 PM   Lab Results  Component Value Date   CREATININE 0.92 03/23/2021   BUN 14 03/23/2021   GFR 68.46 02/20/2019   GFRNONAA >60 03/23/2021   GFRAA 59 (L) 03/23/2020   NA 146 (H) 03/23/2021   K 3.9 03/23/2021   CALCIUM 9.6 03/23/2021   CO2 26 03/23/2021    [facility] Pharmacist Assistant [phone]

## 2022-08-13 NOTE — Telephone Encounter (Signed)
Rx(s) sent to pharmacy electronically.  

## 2022-08-14 ENCOUNTER — Encounter: Payer: Self-pay | Admitting: Internal Medicine

## 2022-08-14 ENCOUNTER — Ambulatory Visit (AMBULATORY_SURGERY_CENTER): Payer: PPO | Admitting: Internal Medicine

## 2022-08-14 VITALS — BP 135/50 | HR 67 | Temp 97.1°F | Resp 20 | Ht 63.0 in | Wt 174.0 lb

## 2022-08-14 DIAGNOSIS — Z09 Encounter for follow-up examination after completed treatment for conditions other than malignant neoplasm: Secondary | ICD-10-CM

## 2022-08-14 DIAGNOSIS — D123 Benign neoplasm of transverse colon: Secondary | ICD-10-CM | POA: Diagnosis not present

## 2022-08-14 DIAGNOSIS — Z800 Family history of malignant neoplasm of digestive organs: Secondary | ICD-10-CM | POA: Diagnosis not present

## 2022-08-14 DIAGNOSIS — Z86010 Personal history of colonic polyps: Secondary | ICD-10-CM | POA: Diagnosis not present

## 2022-08-14 HISTORY — PX: COLONOSCOPY: SHX174

## 2022-08-14 MED ORDER — SODIUM CHLORIDE 0.9 % IV SOLN: 500.0000 mL | Freq: Once | INTRAVENOUS | Status: DC

## 2022-08-14 NOTE — Progress Notes (Signed)
To pacu, VSS. Report to [email or web address] 

## 2022-08-14 NOTE — Progress Notes (Signed)
Called to room to assist during endoscopic procedure.  Patient ID and intended procedure confirmed with present staff. Received instructions for my participation in the procedure from the performing physician.  

## 2022-08-14 NOTE — Op Note (Signed)
[facility] Patient Name: [name] Procedure Date: 08/14/2022 12:27 PM MRN: [id number] Endoscopist: Docia Chuck. Henrene Pastor , MD, [id number] Age: 77 Referring MD:  Date of Birth: 1945-07-14 Gender: Female Account #: [id number] Procedure:                Colonoscopy with cold snare polypectomy x 2 Indications:              High risk colon cancer surveillance: Personal                            history of non-advanced adenoma, High risk colon                            cancer surveillance: Personal history of sessile                            serrated colon polyp (less than 10 mm in size) with                            no dysplasia. Previous examinations 2007, 2012, 2018 Medicines:                Monitored Anesthesia Care Procedure:                Pre-Anesthesia Assessment:                           - Prior to the procedure, a History and Physical                            was performed, and patient medications and                            allergies were reviewed. [name] tolerance of                            previous anesthesia was also reviewed. The risks                            and benefits of the procedure and the sedation                            options and risks were discussed with the patient.                            All questions were answered, and informed consent                            was obtained. Prior Anticoagulants: The patient has                            taken no anticoagulant or antiplatelet agents. ASA                            Grade Assessment: II - A patient with mild systemic  disease. After reviewing the risks and benefits,                            the patient was deemed in satisfactory condition to                            undergo the procedure.                           After obtaining informed consent, the colonoscope                            was passed under direct vision. Throughout the                             procedure, the patient's blood pressure, pulse, and                            oxygen saturations were monitored continuously. The                            Olympus SN [id number] was introduced through the anus                            and advanced to the the cecum, identified by                            appendiceal orifice and ileocecal valve. The                            ileocecal valve, appendiceal orifice, and rectum                            were photographed. The quality of the bowel                            preparation was excellent. The colonoscopy was                            performed without difficulty. The patient tolerated                            the procedure well. The bowel preparation used was                            SUPREP via split dose instruction. Scope In: 12:38:23 PM Scope Out: 12:52:57 PM Scope Withdrawal Time: 0 hours 11 minutes 4 seconds  Total Procedure Duration: 0 hours 14 minutes 34 seconds  Findings:                 Two polyps were found in the transverse colon. The                            polyps were 3 to 5 mm in size. These polyps  were                            removed with a cold snare. Resection and retrieval                            were complete.                           Many diverticula were found in the left colon and                            right colon.                           The exam was otherwise without abnormality on                            direct and retroflexion views. Complications:            No immediate complications. Estimated blood loss:                            None. Estimated Blood Loss:     Estimated blood loss: none. Impression:               - Two 3 to 5 mm polyps in the transverse colon,                            removed with a cold snare. Resected and retrieved.                           - Diverticulosis in the left colon and in the right                            colon.                            - The examination was otherwise normal on direct                            and retroflexion views. Recommendation:           - Repeat colonoscopy in 5 years for surveillance.                           - Patient has a contact number available for                            emergencies. The signs and symptoms of potential                            delayed complications were discussed with the                            patient. Return to normal activities tomorrow.  Written discharge instructions were provided to the                            patient.                           - Resume previous diet.                           - Continue present medications.                           - Await pathology results. Docia Chuck. Henrene Pastor, MD 08/14/2022 1:09:49 PM This report has been signed electronically.

## 2022-08-14 NOTE — Patient Instructions (Addendum)
   Handout on polyps & diverticulosis given to you today  Await pathology results on polyps & removed     YOU HAD AN ENDOSCOPIC PROCEDURE TODAY AT [facility]:   Refer to the procedure report that was given to you for any specific questions about what was found during the examination.  If the procedure report does not answer your questions, please call your gastroenterologist to clarify.  If you requested that your care partner not be given the details of your procedure findings, then the procedure report has been included in a sealed envelope for you to review at your convenience later.  YOU SHOULD EXPECT: Some feelings of bloating in the abdomen. Passage of more gas than usual.  Walking can help get rid of the air that was put into your GI tract during the procedure and reduce the bloating. If you had a lower endoscopy (such as a colonoscopy or flexible sigmoidoscopy) you may notice spotting of blood in your stool or on the toilet paper. If you underwent a bowel prep for your procedure, you may not have a normal bowel movement for a few days.  Please Note:  You might notice some irritation and congestion in your nose or some drainage.  This is from the oxygen used during your procedure.  There is no need for concern and it should clear up in a day or so.  SYMPTOMS TO REPORT IMMEDIATELY:  Following lower endoscopy (colonoscopy or flexible sigmoidoscopy):  Excessive amounts of blood in the stool  Significant tenderness or worsening of abdominal pains  Swelling of the abdomen that is new, acute  Fever of 100F or higher   For urgent or emergent issues, a gastroenterologist can be reached at any hour by calling [phone]. Do not use MyChart messaging for urgent concerns.    DIET:  We do recommend a small meal at first, but then you may proceed to your regular diet.  Drink plenty of fluids but you should avoid alcoholic beverages for 24 hours.  ACTIVITY:  You should  plan to take it easy for the rest of today and you should NOT DRIVE or use heavy machinery until tomorrow (because of the sedation medicines used during the test).    FOLLOW UP: Our staff will call the number listed on your records the next business day following your procedure.  We will call around 7:15- 8:00 am to check on you and address any questions or concerns that you may have regarding the information given to you following your procedure. If we do not reach you, we will leave a message.     If any biopsies were taken you will be contacted by phone or by letter within the next 1-3 weeks.  Please call us at [phone] if you have not heard about the biopsies in 3 weeks.    SIGNATURES/CONFIDENTIALITY: You and/or your care partner have signed paperwork which will be entered into your electronic medical record.  These signatures attest to the fact that that the information above on your After Visit Summary has been reviewed and is understood.  Full responsibility of the confidentiality of this discharge information lies with you and/or your care-partner.

## 2022-08-14 NOTE — Progress Notes (Signed)
HISTORY OF PRESENT ILLNESS:  [name] is a 77 y.o. female with a history of adenomatous and sessile serrated polyps.  Now for surveillance.  No complaints.  REVIEW OF SYSTEMS:  All non-GI ROS negative  Past Medical History:  Diagnosis Date   Abnormal nuclear stress test 12/29/2014   Allergy    Anemia, unspecified    Arthritis    Blood transfusion without reported diagnosis    2018   CAD (coronary artery disease)    sees. Dr. Burt Knack. s/p cath with PCI of RCA (xience des) June 2010. Pt also with LAD and D2 dzs-NL FFR in both areas med Rx   Carotid artery occlusion    Cataract    sees Dr. Herbert Deaner    Depressive disorder, not elsewhere classified    Duodenal nodule    Edema    Fibromyalgia    Gastric polyps    COLON   GERD (gastroesophageal reflux disease)    Glaucoma    narrow angle, sees Dr. Shelly Flatten)    Hearing loss    left ear   Heart murmur    questionable   Hemorrhoids    HSV (herpes simplex virus) anogenital infection 05/2014   HTN (hypertension)    Hyperlipidemia    Low back pain    she sees Dr. Louanne Skye, also Dr. Ernestina Patches for pain medications and Benjiman Core PA for injections    Obesity    Osteopenia 01/2018   T score -1.6 FRAX 7.1% / 1.4%   Peripheral vascular disease ([place])    Resistant hypertension 02/21/2007   Qualifier: Diagnosis of  By: Tiney Rouge CMA, Ellison Hughs     Stroke [facility])    TIA  "many yrs ago"     TIA (transient ischemic attack)    also Hx of it.    Type II or unspecified type diabetes mellitus without mention of complication, not stated as uncontrolled    sees Dr. Carrolyn Meiers     Past Surgical History:  Procedure Laterality Date   ABDOMINAL HYSTERECTOMY  age 33   TAH and USO  Leiomyomata   ANTERIOR CERVICAL CORPECTOMY N/A 06/11/2014   Procedure: ANTERIOR CERVICAL CORPECTOMY CERVICAL SIX; WITH CERVICAL FIVE TO CERVICAL SEVEN ARTHRODESIS;  Surgeon: Ashok Pall, MD;  Location: [place] [facility];  Service: Neurosurgery;  Laterality:  N/A;   BACK SURGERY  2008   lumb fusion   BREAST BIOPSY Left 01/26/2014   Procedure: EXCISION LEFT BREAST MASS;  Surgeon: Adin Hector, MD;  Location: [place];  Service: General;  Laterality: Left;   BREAST LUMPECTOMY WITH RADIOACTIVE SEED LOCALIZATION Left 07/24/2017   Procedure: LEFT BREAST LUMPECTOMY WITH RADIOACTIVE SEED LOCALIZATION ERAS;  Surgeon: Erroll Luna, MD;  Location: [place];  Service: General;  Laterality: Left;   BREAST LUMPECTOMY WITH RADIOACTIVE SEED LOCALIZATION Left 01/13/2020   Procedure: LEFT BREAST LUMPECTOMY WITH RADIOACTIVE SEED LOCALIZATION;  Surgeon: Erroll Luna, MD;  Location: [place];  Service: General;  Laterality: Left;   CARDIAC CATHETERIZATION     CARDIAC CATHETERIZATION N/A 12/29/2014   Procedure: Left Heart Cath and Coronary Angiography;  Surgeon: Sherren Mocha, MD;  Location: [place] [facility];  Service: Cardiovascular;  Laterality: N/A;   CAROTID ENDARTERECTOMY  march 2012   per Dr. Morton Amy   CARPAL TUNNEL RELEASE Left 05/14/2014   Procedure: Left Carpal tunnel release;  Surgeon: Ashok Pall, MD;  Location: [place] [facility];  Service: Neurosurgery;  Laterality: Left;  Left Carpal  tunnel release   CARPAL TUNNEL RELEASE Bilateral    CHOLECYSTECTOMY     COLONOSCOPY  06/26/2017   per Dr. Henrene Pastor, adenomatous polyp, repeat in 5 yrs     CORONARY STENT PLACEMENT     Dr. Burt Knack   DILATION AND CURETTAGE OF UTERUS     ESOPHAGOGASTRODUODENOSCOPY  02-08-06   per Dr. Henrene Pastor, gastritis    EYE SURGERY Left June 2016   Cataract   GLAUCOMA SURGERY     with laser, per Dr. Henrene Pastor    HARDWARE REMOVAL  10/30/2016   Procedure: HARDWARE REMOVAL;  Surgeon: Jessy Oto, MD;  Location: [place];  Service: Orthopedics;;   LUMBAR FUSION  2008   POLYPECTOMY     POSTERIOR LUMBAR FUSION  10/30/2016   Removal of hardware rods and pedicle screws lumbar four, Extension of fusion L4-5 to L5-S1 with reinsertion of screws  at L 4, left L5-S1 transforaminal lumbar interbody fusion, Exploration  left L5 nerve root, bilateral decompressive laminectomy L3-4/notes 10/30/2016   ULNAR NERVE TRANSPOSITION Left 05/14/2014   Procedure: Left Ulnar nerve decompression;  Surgeon: Ashok Pall, MD;  Location: [place] [facility];  Service: Neurosurgery;  Laterality: Left;  Left Ulnar nerve decompression    Social History Myrene Bougher  reports that she quit smoking about 55 years ago. Her smoking use included cigarettes. She has never used smokeless tobacco. She reports that she does not drink alcohol and does not use drugs.  family history includes Breast cancer (age of onset: 49) in her cousin; Breast cancer (age of onset: 70) in her maternal aunt; Breast cancer (age of onset: 50) in her cousin; Breast cancer (age of onset: 70) in her cousin; Cancer in her brother, father, and maternal aunt; Diabetes in her brother; Esophageal cancer in her cousin; Heart disease in her brother and maternal grandfather; Hyperlipidemia in her sister; Hypertension in her brother, mother, and sister; Prostate cancer (age of onset: 14) in her cousin; Stomach cancer (age of onset: 23) in her maternal grandmother; Varicose Veins in her brother.  Allergies  Allergen Reactions   Dulaglutide Nausea Only    Other reaction(s): made her sick Other reaction(s): made her sick       PHYSICAL EXAMINATION: Vital signs: BP (!) 134/39   Pulse (!) 58   Temp (!) 97.1 F (36.2 C) (Temporal)   Ht '5\' 3"'$  (1.6 m)   Wt 174 lb (78.9 kg)   SpO2 99%   BMI 30.82 kg/m  General: Well-developed, well-nourished, no acute distress HEENT: Sclerae are anicteric, conjunctiva pink. Oral mucosa intact Lungs: Clear Heart: Regular Abdomen: soft, nontender, nondistended, no obvious ascites, no peritoneal signs, normal bowel sounds. No organomegaly. Extremities: No edema Psychiatric: alert and oriented x3. Cooperative     ASSESSMENT:  Personal history of adenomatous  and sessile serrated polyps   PLAN:   Surveillance colonoscopy

## 2022-08-14 NOTE — Progress Notes (Signed)
Pt's states no medical or surgical changes since previsit or office visit. 

## 2022-08-15 ENCOUNTER — Telehealth: Payer: Self-pay | Admitting: *Deleted

## 2022-08-15 NOTE — Telephone Encounter (Signed)
No answer on  follow up call. Left message.   

## 2022-08-16 DIAGNOSIS — M6281 Muscle weakness (generalized): Secondary | ICD-10-CM | POA: Diagnosis not present

## 2022-08-16 DIAGNOSIS — R2689 Other abnormalities of gait and mobility: Secondary | ICD-10-CM | POA: Diagnosis not present

## 2022-08-16 DIAGNOSIS — M7061 Trochanteric bursitis, right hip: Secondary | ICD-10-CM | POA: Diagnosis not present

## 2022-08-16 DIAGNOSIS — M25651 Stiffness of right hip, not elsewhere classified: Secondary | ICD-10-CM | POA: Diagnosis not present

## 2022-08-20 ENCOUNTER — Encounter: Payer: Self-pay | Admitting: Internal Medicine

## 2022-08-22 ENCOUNTER — Other Ambulatory Visit: Payer: Self-pay | Admitting: Family Medicine

## 2022-08-22 DIAGNOSIS — H402233 Chronic angle-closure glaucoma, bilateral, severe stage: Secondary | ICD-10-CM | POA: Diagnosis not present

## 2022-08-22 DIAGNOSIS — H04123 Dry eye syndrome of bilateral lacrimal glands: Secondary | ICD-10-CM | POA: Diagnosis not present

## 2022-08-22 DIAGNOSIS — H35033 Hypertensive retinopathy, bilateral: Secondary | ICD-10-CM | POA: Diagnosis not present

## 2022-08-22 DIAGNOSIS — H524 Presbyopia: Secondary | ICD-10-CM | POA: Diagnosis not present

## 2022-08-22 DIAGNOSIS — K219 Gastro-esophageal reflux disease without esophagitis: Secondary | ICD-10-CM

## 2022-08-22 DIAGNOSIS — Z794 Long term (current) use of insulin: Secondary | ICD-10-CM | POA: Diagnosis not present

## 2022-08-22 DIAGNOSIS — E119 Type 2 diabetes mellitus without complications: Secondary | ICD-10-CM | POA: Diagnosis not present

## 2022-08-22 LAB — HM DIABETES EYE EXAM

## 2022-08-23 DIAGNOSIS — R2689 Other abnormalities of gait and mobility: Secondary | ICD-10-CM | POA: Diagnosis not present

## 2022-08-23 DIAGNOSIS — M7061 Trochanteric bursitis, right hip: Secondary | ICD-10-CM | POA: Diagnosis not present

## 2022-08-23 DIAGNOSIS — M25651 Stiffness of right hip, not elsewhere classified: Secondary | ICD-10-CM | POA: Diagnosis not present

## 2022-08-23 DIAGNOSIS — M6281 Muscle weakness (generalized): Secondary | ICD-10-CM | POA: Diagnosis not present

## 2022-08-27 ENCOUNTER — Ambulatory Visit
Admission: RE | Admit: 2022-08-27 | Discharge: 2022-08-27 | Disposition: A | Discharge status: Home / Self Care | Payer: PPO | Source: Ambulatory Visit | Attending: Orthopedic Surgery | Admitting: Orthopedic Surgery

## 2022-08-27 DIAGNOSIS — M1612 Unilateral primary osteoarthritis, left hip: Secondary | ICD-10-CM | POA: Diagnosis not present

## 2022-08-27 DIAGNOSIS — S76012A Strain of muscle, fascia and tendon of left hip, initial encounter: Secondary | ICD-10-CM | POA: Diagnosis not present

## 2022-08-27 DIAGNOSIS — M533 Sacrococcygeal disorders, not elsewhere classified: Secondary | ICD-10-CM | POA: Diagnosis not present

## 2022-08-27 DIAGNOSIS — M25551 Pain in right hip: Secondary | ICD-10-CM

## 2022-08-27 DIAGNOSIS — S76312A Strain of muscle, fascia and tendon of the posterior muscle group at thigh level, left thigh, initial encounter: Secondary | ICD-10-CM | POA: Diagnosis not present

## 2022-08-27 DIAGNOSIS — M25452 Effusion, left hip: Secondary | ICD-10-CM | POA: Diagnosis not present

## 2022-08-28 DIAGNOSIS — M7061 Trochanteric bursitis, right hip: Secondary | ICD-10-CM | POA: Diagnosis not present

## 2022-08-28 DIAGNOSIS — R2689 Other abnormalities of gait and mobility: Secondary | ICD-10-CM | POA: Diagnosis not present

## 2022-08-28 DIAGNOSIS — M25651 Stiffness of right hip, not elsewhere classified: Secondary | ICD-10-CM | POA: Diagnosis not present

## 2022-08-28 DIAGNOSIS — M6281 Muscle weakness (generalized): Secondary | ICD-10-CM | POA: Diagnosis not present

## 2022-08-30 DIAGNOSIS — R2689 Other abnormalities of gait and mobility: Secondary | ICD-10-CM | POA: Diagnosis not present

## 2022-08-30 DIAGNOSIS — M5441 Lumbago with sciatica, right side: Secondary | ICD-10-CM | POA: Diagnosis not present

## 2022-08-30 DIAGNOSIS — M7061 Trochanteric bursitis, right hip: Secondary | ICD-10-CM | POA: Diagnosis not present

## 2022-08-30 DIAGNOSIS — M25551 Pain in right hip: Secondary | ICD-10-CM | POA: Diagnosis not present

## 2022-08-30 DIAGNOSIS — M25552 Pain in left hip: Secondary | ICD-10-CM | POA: Diagnosis not present

## 2022-08-30 DIAGNOSIS — M6281 Muscle weakness (generalized): Secondary | ICD-10-CM | POA: Diagnosis not present

## 2022-08-30 DIAGNOSIS — M25651 Stiffness of right hip, not elsewhere classified: Secondary | ICD-10-CM | POA: Diagnosis not present

## 2022-08-30 DIAGNOSIS — M5442 Lumbago with sciatica, left side: Secondary | ICD-10-CM | POA: Diagnosis not present

## 2022-09-04 DIAGNOSIS — M25651 Stiffness of right hip, not elsewhere classified: Secondary | ICD-10-CM | POA: Diagnosis not present

## 2022-09-04 DIAGNOSIS — M6281 Muscle weakness (generalized): Secondary | ICD-10-CM | POA: Diagnosis not present

## 2022-09-04 DIAGNOSIS — M7061 Trochanteric bursitis, right hip: Secondary | ICD-10-CM | POA: Diagnosis not present

## 2022-09-04 DIAGNOSIS — R2689 Other abnormalities of gait and mobility: Secondary | ICD-10-CM | POA: Diagnosis not present

## 2022-09-06 DIAGNOSIS — R2689 Other abnormalities of gait and mobility: Secondary | ICD-10-CM | POA: Diagnosis not present

## 2022-09-06 DIAGNOSIS — M7061 Trochanteric bursitis, right hip: Secondary | ICD-10-CM | POA: Diagnosis not present

## 2022-09-06 DIAGNOSIS — M25651 Stiffness of right hip, not elsewhere classified: Secondary | ICD-10-CM | POA: Diagnosis not present

## 2022-09-06 DIAGNOSIS — M6281 Muscle weakness (generalized): Secondary | ICD-10-CM | POA: Diagnosis not present

## 2022-09-11 ENCOUNTER — Other Ambulatory Visit (HOSPITAL_BASED_OUTPATIENT_CLINIC_OR_DEPARTMENT_OTHER): Payer: Self-pay | Admitting: Cardiovascular Disease

## 2022-09-11 DIAGNOSIS — M25651 Stiffness of right hip, not elsewhere classified: Secondary | ICD-10-CM | POA: Diagnosis not present

## 2022-09-11 DIAGNOSIS — M7061 Trochanteric bursitis, right hip: Secondary | ICD-10-CM | POA: Diagnosis not present

## 2022-09-11 DIAGNOSIS — M6281 Muscle weakness (generalized): Secondary | ICD-10-CM | POA: Diagnosis not present

## 2022-09-11 DIAGNOSIS — R2689 Other abnormalities of gait and mobility: Secondary | ICD-10-CM | POA: Diagnosis not present

## 2022-09-11 NOTE — Telephone Encounter (Signed)
Rx(s) sent to pharmacy electronically.  

## 2022-09-13 ENCOUNTER — Telehealth: Payer: Self-pay

## 2022-09-13 DIAGNOSIS — M7061 Trochanteric bursitis, right hip: Secondary | ICD-10-CM | POA: Diagnosis not present

## 2022-09-13 DIAGNOSIS — M25651 Stiffness of right hip, not elsewhere classified: Secondary | ICD-10-CM | POA: Diagnosis not present

## 2022-09-13 DIAGNOSIS — M6281 Muscle weakness (generalized): Secondary | ICD-10-CM | POA: Diagnosis not present

## 2022-09-13 DIAGNOSIS — R2689 Other abnormalities of gait and mobility: Secondary | ICD-10-CM | POA: Diagnosis not present

## 2022-09-13 NOTE — Progress Notes (Unsigned)
Care Management & Coordination Services Pharmacy Team  Reason for Encounter: Medication coordination and delivery  Contacted patient to discuss medications and coordinate delivery from [facility]. {US HC Outreach:28874}  Cycle dispensing form sent to *** for review.   Last adherence delivery date:08/24/2022      Patient is due for next adherence delivery on: 09/25/2022 SCHED FU next avail This delivery to include: Adherence Packaging  30 Days  Vitamin D2 50,000 units once weekly at breakfast on Sat. Spironolactone 50 mg 1 tablet at breakfast Rosuvastatin 40 mg 1 tablet at breakfast Isosorbide ER 60 mg 1.5 tablets at breakfast Montelukast 10 mg 1 tablet at bedtime Pantoprazole 40 mg 1 tablet daily at breakfast Metoprolol 100 mg 1 tablet at bedtime Chlorthalidone 25 mg 1 tablet at breakfast Hydralazine 100 mg 1 tablet at breakfast, lunch and bedtime Irbesartan 300 mg 1 tablet at bedtime Aspirin 81 mg at breakfast  Patient declined the following medications this month: V-go 40 use as directed   {Delivery date:25786}  Any concerns about your medications? {yes/no:20286}  How often do you forget or accidentally miss a dose? {Missed doses:25554}  Is patient in packaging Yes  What is the date on your next pill pack?  Any concerns or issues with your packaging?   Recent blood pressure readings are as follows:***  Recent blood glucose readings are as follows:***   Chart review: Recent office visits:  None  Recent consult visits:  08/14/2022 - Scarlette Shorts MD (GI) - Patient was seen for a colonoscopy.  Hospital visits:  None  Medications: Outpatient Encounter Medications as of 09/13/2022  Medication Sig   ACCU-CHEK GUIDE test strip 1 each by Other route in the morning and at bedtime.   aspirin EC 81 MG tablet Take 1 tablet (81 mg total) by mouth daily. Swallow whole.   chlorthalidone (HYGROTON) 25 MG tablet TAKE ONE TABLET BY MOUTH ONCE DAILY Please schedule an  appt with Dr Oval Linsey for further refills.   ergocalciferol (VITAMIN D2) 50000 UNITS capsule Take 50,000 Units by mouth every Saturday. On Saturday.   FARXIGA 10 MG TABS tablet Take 10 mg by mouth every morning.   gabapentin (NEURONTIN) 300 MG capsule Take 300 mg by mouth as needed.   HUMALOG 100 UNIT/ML injection    hydrALAZINE (APRESOLINE) 100 MG tablet TAKE ONE TABLET BY MOUTH EVERY MORNING and TAKE ONE TABLET BY MOUTH AT NOON and TAKE ONE TABLET BY MOUTH EVERYDAY AT BEDTIME   Insulin Disposable Pump (V-GO 40) KIT Inject 1 application into the skin See admin instructions.   irbesartan (AVAPRO) 300 MG tablet TAKE ONE TABLET BY MOUTH EVERYDAY AT BEDTIME   isosorbide mononitrate (IMDUR) 60 MG 24 hr tablet TAKE 1 AND 1/2 TABLETS BY MOUTH ONCE DAILY   latanoprost (XALATAN) 0.005 % ophthalmic solution Place 1 drop into both eyes at bedtime.    metoprolol succinate (TOPROL-XL) 100 MG 24 hr tablet TAKE ONE TABLET BY MOUTH EVERYDAY AT BEDTIME   montelukast (SINGULAIR) 10 MG tablet Take 10 mg by mouth daily.   nitroGLYCERIN (NITROSTAT) 0.4 MG SL tablet Place 1 tablet (0.4 mg total) under the tongue every 5 (five) minutes as needed for chest pain.   OZEMPIC, 1 MG/DOSE, 4 MG/3ML SOPN Inject 1 mg into the skin once a week.   pantoprazole (PROTONIX) 40 MG tablet TAKE ONE TABLET BY MOUTH EVERY MORNING   Probiotic Product (PROBIOTIC ADVANCED PO) Take 1 capsule by mouth daily.   rosuvastatin (CRESTOR) 40 MG tablet TAKE ONE TABLET BY  MOUTH ONCE DAILY   spironolactone (ALDACTONE) 50 MG tablet TAKE ONE TABLET BY MOUTH ONCE DAILY   TYRVAYA 0.03 MG/ACT SOLN Place 1 spray into both nostrils 2 (two) times daily.   No facility-administered encounter medications on file as of 09/13/2022.   BP Readings from Last 3 Encounters:  08/14/22 (!) 135/50  07/31/22 (!) 90/40  03/26/22 (!) 110/46    Pulse Readings from Last 3 Encounters:  08/14/22 67  07/31/22 67  03/26/22 62    Lab Results  Component Value Date/Time    HGBA1C 15.2 (H) 02/20/2019 10:22 AM   HGBA1C 9.3 (H) 03/27/2017 04:15 PM   Lab Results  Component Value Date   CREATININE 0.92 03/23/2021   BUN 14 03/23/2021   GFR 68.46 02/20/2019   GFRNONAA >60 03/23/2021   GFRAA 59 (L) 03/23/2020   NA 146 (H) 03/23/2021   K 3.9 03/23/2021   CALCIUM 9.6 03/23/2021   CO2 26 03/23/2021   [place] Pharmacist Assistant [phone]

## 2022-09-14 DIAGNOSIS — M546 Pain in thoracic spine: Secondary | ICD-10-CM | POA: Diagnosis not present

## 2022-09-14 DIAGNOSIS — M545 Low back pain, unspecified: Secondary | ICD-10-CM | POA: Diagnosis not present

## 2022-09-14 DIAGNOSIS — M5416 Radiculopathy, lumbar region: Secondary | ICD-10-CM | POA: Diagnosis not present

## 2022-09-20 DIAGNOSIS — D860 Sarcoidosis of lung: Secondary | ICD-10-CM | POA: Diagnosis not present

## 2022-09-20 DIAGNOSIS — Z853 Personal history of malignant neoplasm of breast: Secondary | ICD-10-CM | POA: Diagnosis not present

## 2022-09-20 DIAGNOSIS — I2510 Atherosclerotic heart disease of native coronary artery without angina pectoris: Secondary | ICD-10-CM | POA: Diagnosis not present

## 2022-09-20 DIAGNOSIS — Z794 Long term (current) use of insulin: Secondary | ICD-10-CM | POA: Diagnosis not present

## 2022-09-20 DIAGNOSIS — M5416 Radiculopathy, lumbar region: Secondary | ICD-10-CM | POA: Diagnosis not present

## 2022-09-20 DIAGNOSIS — M8580 Other specified disorders of bone density and structure, unspecified site: Secondary | ICD-10-CM | POA: Diagnosis not present

## 2022-09-20 DIAGNOSIS — E1159 Type 2 diabetes mellitus with other circulatory complications: Secondary | ICD-10-CM | POA: Diagnosis not present

## 2022-09-20 DIAGNOSIS — I129 Hypertensive chronic kidney disease with stage 1 through stage 4 chronic kidney disease, or unspecified chronic kidney disease: Secondary | ICD-10-CM | POA: Diagnosis not present

## 2022-09-20 DIAGNOSIS — N1831 Chronic kidney disease, stage 3a: Secondary | ICD-10-CM | POA: Diagnosis not present

## 2022-09-20 DIAGNOSIS — E669 Obesity, unspecified: Secondary | ICD-10-CM | POA: Diagnosis not present

## 2022-09-20 DIAGNOSIS — M5030 Other cervical disc degeneration, unspecified cervical region: Secondary | ICD-10-CM | POA: Diagnosis not present

## 2022-09-20 DIAGNOSIS — E785 Hyperlipidemia, unspecified: Secondary | ICD-10-CM | POA: Diagnosis not present

## 2022-09-27 ENCOUNTER — Other Ambulatory Visit: Payer: Self-pay

## 2022-09-27 ENCOUNTER — Encounter: Payer: Self-pay | Admitting: Family Medicine

## 2022-09-27 ENCOUNTER — Ambulatory Visit (INDEPENDENT_AMBULATORY_CARE_PROVIDER_SITE_OTHER): Payer: PPO | Admitting: Family Medicine

## 2022-09-27 VITALS — BP 98/48 | HR 82 | Temp 98.0°F | Wt 165.4 lb

## 2022-09-27 DIAGNOSIS — J3089 Other allergic rhinitis: Secondary | ICD-10-CM | POA: Diagnosis not present

## 2022-09-27 DIAGNOSIS — R059 Cough, unspecified: Secondary | ICD-10-CM | POA: Diagnosis not present

## 2022-09-27 DIAGNOSIS — R0989 Other specified symptoms and signs involving the circulatory and respiratory systems: Secondary | ICD-10-CM

## 2022-09-27 LAB — POC COVID19 BINAXNOW: SARS Coronavirus 2 Ag: NEGATIVE

## 2022-09-27 MED ORDER — METHYLPREDNISOLONE ACETATE 80 MG/ML IJ SUSP
80.0000 mg | Freq: Once | INTRAMUSCULAR | Status: AC
  Administered 2022-09-27: 80 mg via INTRAMUSCULAR

## 2022-09-27 MED ORDER — HYDROCODONE BIT-HOMATROP MBR 5-1.5 MG/5ML PO SOLN: 5.0000 mL | ORAL | 0 refills | Status: DC | PRN

## 2022-09-27 MED ORDER — METHYLPREDNISOLONE ACETATE 40 MG/ML IJ SUSP
40.0000 mg | Freq: Once | INTRAMUSCULAR | Status: AC
  Administered 2022-09-27: 40 mg via INTRAMUSCULAR

## 2022-09-27 NOTE — Progress Notes (Signed)
   Subjective:    Patient ID: Syliva Rosson, female    DOB: [birth year], 77 y.o.   MRN: [id number]  HPI Here for 3 days of runny nose, sneezing, PND, and a dry cough. No fever or ST. No SOB. She tested negative for the Covid virus.   Review of Systems  Constitutional: Negative.   HENT:  Positive for congestion, postnasal drip, rhinorrhea and sneezing. Negative for ear pain, sinus pressure and sore throat.   Eyes: Negative.   Respiratory:  Positive for cough. Negative for shortness of breath and wheezing.        Objective:   Physical Exam Constitutional:      Appearance: Normal appearance. She is not ill-appearing.  HENT:     Right Ear: Tympanic membrane, ear canal and external ear normal.     Left Ear: Tympanic membrane, ear canal and external ear normal.     Nose: Nose normal.     Mouth/Throat:     Pharynx: Oropharynx is clear.  Eyes:     Conjunctiva/sclera: Conjunctivae normal.  Pulmonary:     Effort: Pulmonary effort is normal.     Breath sounds: Normal breath sounds.  Lymphadenopathy:     Cervical: No cervical adenopathy.  Neurological:     Mental Status: She is alert.           Assessment & Plan:  She is having a flare of seasonal allergies. She is given a shot of DepoMedrol. She will start taking Zyrtec daily. Use Hycodan syrup as needed.  Alysia Penna, MD

## 2022-09-27 NOTE — Addendum Note (Signed)
Addended by: Wyvonne Lenz on: 09/27/2022 11:50 AM   Modules accepted: Orders

## 2022-09-27 NOTE — Patient Instructions (Signed)
Health Maintenance Due  Topic Date Due   Medicare Annual Wellness (AWV)  Never done   FOOT EXAM  Never done   Hepatitis C Screening  Never done   Diabetic kidney evaluation - Urine ACR  03/21/2013   HEMOGLOBIN A1C  08/23/2019   Zoster Vaccines- Shingrix (2 of 2) 11/12/2021   COVID-19 Vaccine (5 - 2023-24 season) 03/09/2022   Diabetic kidney evaluation - eGFR measurement  03/23/2022   OPHTHALMOLOGY EXAM  04/18/2022      Row Labels 07/31/2022    2:35 PM 05/07/2019    1:55 PM 11/24/2014    2:53 PM  Depression screen PHQ 2/9   Section Header. No data exists in this row.     Decreased Interest   0 0 0  Down, Depressed, Hopeless   0 1 0  PHQ - 2 Score   0 1 0

## 2022-09-28 MED ORDER — METHYLPREDNISOLONE ACETATE 80 MG/ML IJ SUSP
80.0000 mg | Freq: Once | INTRAMUSCULAR | Status: AC
  Administered 2022-09-27: 80 mg via INTRAMUSCULAR

## 2022-09-28 MED ORDER — METHYLPREDNISOLONE ACETATE 40 MG/ML IJ SUSP
40.0000 mg | Freq: Once | INTRAMUSCULAR | Status: AC
  Administered 2022-09-27: 40 mg via INTRAMUSCULAR

## 2022-09-28 NOTE — Addendum Note (Signed)
Addended by: Wyvonne Lenz on: 09/28/2022 08:52 AM   Modules accepted: Orders

## 2022-09-28 NOTE — Addendum Note (Signed)
Addended by: Wyvonne Lenz on: 09/28/2022 09:44 AM   Modules accepted: Orders

## 2022-10-01 MED ORDER — METHYLPREDNISOLONE ACETATE 40 MG/ML IJ SUSP
40.0000 mg | Freq: Once | INTRAMUSCULAR | Status: AC
  Administered 2022-09-27: 40 mg via INTRAMUSCULAR

## 2022-10-01 MED ORDER — METHYLPREDNISOLONE ACETATE 80 MG/ML IJ SUSP
80.0000 mg | Freq: Once | INTRAMUSCULAR | Status: AC
  Administered 2022-09-27: 80 mg via INTRAMUSCULAR

## 2022-10-01 NOTE — Addendum Note (Signed)
Addended by: Wyvonne Lenz on: 10/01/2022 11:36 AM   Modules accepted: Orders

## 2022-10-03 DIAGNOSIS — R2681 Unsteadiness on feet: Secondary | ICD-10-CM | POA: Diagnosis not present

## 2022-10-03 DIAGNOSIS — M545 Low back pain, unspecified: Secondary | ICD-10-CM | POA: Diagnosis not present

## 2022-10-03 DIAGNOSIS — M546 Pain in thoracic spine: Secondary | ICD-10-CM | POA: Diagnosis not present

## 2022-10-03 DIAGNOSIS — R531 Weakness: Secondary | ICD-10-CM | POA: Diagnosis not present

## 2022-10-04 DIAGNOSIS — M545 Low back pain, unspecified: Secondary | ICD-10-CM | POA: Diagnosis not present

## 2022-10-04 DIAGNOSIS — M25511 Pain in right shoulder: Secondary | ICD-10-CM | POA: Diagnosis not present

## 2022-10-04 DIAGNOSIS — M25552 Pain in left hip: Secondary | ICD-10-CM | POA: Diagnosis not present

## 2022-10-04 DIAGNOSIS — M25551 Pain in right hip: Secondary | ICD-10-CM | POA: Diagnosis not present

## 2022-10-10 ENCOUNTER — Other Ambulatory Visit: Payer: Self-pay | Admitting: Orthopedic Surgery

## 2022-10-10 DIAGNOSIS — M545 Low back pain, unspecified: Secondary | ICD-10-CM

## 2022-10-10 DIAGNOSIS — M546 Pain in thoracic spine: Secondary | ICD-10-CM | POA: Diagnosis not present

## 2022-10-10 DIAGNOSIS — R2681 Unsteadiness on feet: Secondary | ICD-10-CM | POA: Diagnosis not present

## 2022-10-10 DIAGNOSIS — R531 Weakness: Secondary | ICD-10-CM | POA: Diagnosis not present

## 2022-10-11 ENCOUNTER — Telehealth: Payer: Self-pay

## 2022-10-11 ENCOUNTER — Other Ambulatory Visit: Payer: Self-pay | Admitting: Family Medicine

## 2022-10-11 DIAGNOSIS — K219 Gastro-esophageal reflux disease without esophagitis: Secondary | ICD-10-CM

## 2022-10-11 NOTE — Progress Notes (Signed)
Care Management & Coordination Services Pharmacy Team  Reason for Encounter: Medication coordination and delivery  Contacted patient to discuss medications and coordinate delivery from [facility]. Unsuccessful outreach. Left voicemail for patient to return call. Multiple Attempts  Cycle dispensing form sent to [name] for review.  Last adherence delivery date:09/25/2022      Patient is due for next adherence delivery on: 10/24/2022  This delivery to include: Adherence Packaging  30 Days  Vitamin D2 50,000 units once weekly at breakfast on Sat. Spironolactone 50 mg 1 tablet at breakfast Rosuvastatin 40 mg 1 tablet at breakfast Isosorbide ER 60 mg 1.5 tablets at breakfast Montelukast 10 mg 1 tablet at bedtime Pantoprazole 40 mg 1 tablet daily at breakfast Metoprolol 100 mg 1 tablet at bedtime Chlorthalidone 25 mg 1 tablet at breakfast Hydralazine 100 mg 1 tablet at breakfast, lunch and bedtime Irbesartan 300 mg 1 tablet at bedtime Aspirin 81 mg at breakfast  Patient declined the following medications this month: V-go 40 use as directed   Delivery scheduled for 10/24/2022. Unable to speak with patient to confirm date.   Any concerns about your medications?  How often do you forget or accidentally miss a dose?   Is patient in packaging Yes  If yes  What is the date on your next pill pack?  Any concerns or issues with your packaging?   Recent blood pressure readings are as follows:  Recent blood glucose readings are as follows:   Chart review: Recent office visits:  09/27/2022 Gershon Crane MD - Patient was seen for Environmental and seasonal allergies and additional concerns. Started Hycodan and Methylprednisolone.   Recent consult visits:  None  Hospital visits:  None  Medications: Outpatient Encounter Medications as of 10/11/2022  Medication Sig   ACCU-CHEK GUIDE test strip 1 each by Other route in the morning and at bedtime.   aspirin EC 81 MG  tablet Take 1 tablet (81 mg total) by mouth daily. Swallow whole.   chlorthalidone (HYGROTON) 25 MG tablet TAKE ONE TABLET BY MOUTH ONCE DAILY Please schedule an appt with Dr Duke Salvia for further refills.   ergocalciferol (VITAMIN D2) 50000 UNITS capsule Take 50,000 Units by mouth every Saturday. On Saturday.   FARXIGA 10 MG TABS tablet Take 10 mg by mouth every morning.   gabapentin (NEURONTIN) 300 MG capsule Take 300 mg by mouth as needed.   HUMALOG 100 UNIT/ML injection    hydrALAZINE (APRESOLINE) 100 MG tablet TAKE ONE TABLET BY MOUTH EVERY MORNING and TAKE ONE TABLET BY MOUTH AT NOON and TAKE ONE TABLET BY MOUTH EVERYDAY AT BEDTIME   HYDROcodone bit-homatropine (HYCODAN) 5-1.5 MG/5ML syrup Take 5 mLs by mouth every 4 (four) hours as needed.   Insulin Disposable Pump (V-GO 40) KIT Inject 1 application into the skin See admin instructions.   irbesartan (AVAPRO) 300 MG tablet TAKE ONE TABLET BY MOUTH EVERYDAY AT BEDTIME   isosorbide mononitrate (IMDUR) 60 MG 24 hr tablet TAKE 1 AND 1/2 TABLETS BY MOUTH ONCE DAILY   latanoprost (XALATAN) 0.005 % ophthalmic solution Place 1 drop into both eyes at bedtime.    metoprolol succinate (TOPROL-XL) 100 MG 24 hr tablet TAKE ONE TABLET BY MOUTH EVERYDAY AT BEDTIME   montelukast (SINGULAIR) 10 MG tablet Take 10 mg by mouth daily.   nitroGLYCERIN (NITROSTAT) 0.4 MG SL tablet Place 1 tablet (0.4 mg total) under the tongue every 5 (five) minutes as needed for chest pain.   OZEMPIC, 1 MG/DOSE, 4 MG/3ML SOPN Inject 1 mg  into the skin once a week.   pantoprazole (PROTONIX) 40 MG tablet TAKE ONE TABLET BY MOUTH EVERY MORNING Needs appointment for further refills   Probiotic Product (PROBIOTIC ADVANCED PO) Take 1 capsule by mouth daily.   rosuvastatin (CRESTOR) 40 MG tablet TAKE ONE TABLET BY MOUTH ONCE DAILY   spironolactone (ALDACTONE) 50 MG tablet TAKE ONE TABLET BY MOUTH ONCE DAILY   TYRVAYA 0.03 MG/ACT SOLN Place 1 spray into both nostrils 2 (two) times daily.    No facility-administered encounter medications on file as of 10/11/2022.   BP Readings from Last 3 Encounters:  09/27/22 (!) 98/48  08/14/22 (!) 135/50  07/31/22 (!) 90/40    Pulse Readings from Last 3 Encounters:  09/27/22 82  08/14/22 67  07/31/22 67    Lab Results  Component Value Date/Time   HGBA1C 15.2 (H) 02/20/2019 10:22 AM   HGBA1C 9.3 (H) 03/27/2017 04:15 PM   Lab Results  Component Value Date   CREATININE 0.92 03/23/2021   BUN 14 03/23/2021   GFR 68.46 02/20/2019   GFRNONAA >60 03/23/2021   GFRAA 59 (L) 03/23/2020   NA 146 (H) 03/23/2021   K 3.9 03/23/2021   CALCIUM 9.6 03/23/2021   CO2 26 03/23/2021   Inetta Fermo CMA  Clinical Pharmacist Assistant [phone]

## 2022-10-12 ENCOUNTER — Ambulatory Visit
Admission: RE | Admit: 2022-10-12 | Discharge: 2022-10-12 | Disposition: A | Discharge status: Home / Self Care | Payer: PPO | Source: Ambulatory Visit | Attending: Orthopedic Surgery | Admitting: Orthopedic Surgery

## 2022-10-12 DIAGNOSIS — M545 Low back pain, unspecified: Secondary | ICD-10-CM

## 2022-10-12 DIAGNOSIS — M4126 Other idiopathic scoliosis, lumbar region: Secondary | ICD-10-CM | POA: Diagnosis not present

## 2022-10-12 DIAGNOSIS — R2681 Unsteadiness on feet: Secondary | ICD-10-CM | POA: Diagnosis not present

## 2022-10-12 DIAGNOSIS — M546 Pain in thoracic spine: Secondary | ICD-10-CM | POA: Diagnosis not present

## 2022-10-12 DIAGNOSIS — R531 Weakness: Secondary | ICD-10-CM | POA: Diagnosis not present

## 2022-10-12 DIAGNOSIS — M47816 Spondylosis without myelopathy or radiculopathy, lumbar region: Secondary | ICD-10-CM | POA: Diagnosis not present

## 2022-10-14 ENCOUNTER — Ambulatory Visit
Admission: EM | Admit: 2022-10-14 | Discharge: 2022-10-14 | Disposition: A | Discharge status: Home / Self Care | Payer: PPO | Attending: Internal Medicine | Admitting: Internal Medicine

## 2022-10-14 DIAGNOSIS — H6121 Impacted cerumen, right ear: Secondary | ICD-10-CM

## 2022-10-14 NOTE — ED Provider Notes (Signed)
[name]    CSN: [id number] Arrival date & time: 10/14/22  1430      History   Chief Complaint Chief Complaint  Patient presents with   Otalgia    HPI [name] is a 77 y.o. female.   Patient presents with decreased hearing in the right ear that started about 3 days ago.  Patient denies trauma, foreign body, drainage from the ears.  Patient reports that she has decreased hearing in her left ear at baseline   Otalgia   Past Medical History:  Diagnosis Date   Abnormal nuclear stress test 12/29/2014   Allergy    Anemia, unspecified    Arthritis    Blood transfusion without reported diagnosis    2018   CAD (coronary artery disease)    sees. Dr. Excell Seltzer. s/p cath with PCI of RCA (xience des) June 2010. Pt also with LAD and D2 dzs-NL FFR in both areas med Rx   Carotid artery occlusion    Cataract    sees Dr. Elmer Picker    Depressive disorder, not elsewhere classified    Duodenal nodule    Edema    Fibromyalgia    Gastric polyps    COLON   GERD (gastroesophageal reflux disease)    Glaucoma    narrow angle, sees Dr. Derek Juno Bozard)    Hearing loss    left ear   Heart murmur    questionable   Hemorrhoids    HSV (herpes simplex virus) anogenital infection 05/2014   HTN (hypertension)    Hyperlipidemia    Low back pain    she sees Dr. Otelia Sergeant, also Dr. Alvester Morin for pain medications and Zonia Kief PA for injections    Obesity    Osteopenia 01/2018   T score -1.6 FRAX 7.1% / 1.4%   Peripheral vascular disease    Resistant hypertension 02/21/2007   Qualifier: Diagnosis of  By: Briscoe Burns CMA, Alvy Beal     Stroke    TIA  "many yrs ago"     TIA (transient ischemic attack)    also Hx of it.    Type II or unspecified type diabetes mellitus without mention of complication, not stated as uncontrolled    sees Dr. Ardyth Harps     Patient Active Problem List   Diagnosis Date Noted   Environmental and seasonal allergies 09/27/2022    Asymptomatic varicose veins of both lower extremities 11/04/2020   Ductal carcinoma in situ (DCIS) of left breast 01/06/2020   Right-sided thoracic back pain 07/01/2019   Hyperlipidemia LDL goal <70 07/17/2017   History of lumbar spinal fusion    History of fusion of lumbar spine    Anemia due to blood loss 11/02/2016    Class: Acute   Lethargy 10/31/2016   AKI (acute kidney injury) 10/31/2016   (HFpEF) heart failure with preserved ejection fraction 10/31/2016   Cough    Leukocytosis    Spondylolisthesis, lumbar region 10/30/2016    Class: Chronic   Spinal stenosis of lumbar region 10/30/2016    Class: Chronic   Retained orthopedic hardware 10/30/2016    Class: Chronic   Spinal stenosis of lumbar region with neurogenic claudication 10/30/2016   Genetic testing 09/14/2015   Atypical ductal hyperplasia of left breast 08/24/2015   Family history of breast cancer    Family history of prostate cancer    Family history of stomach cancer    Abnormal nuclear stress test 12/29/2014   Cervical spondylosis with myelopathy and  radiculopathy 06/11/2014   Carpal tunnel syndrome 05/14/2014   Aftercare following surgery of the circulatory system, NEC 03/11/2014   Pain of right lower extremity 03/11/2014   Atypical lobular hyperplasia of left breast 02/12/2014   Fibromyalgia 09/21/2013   Carotid artery disease (HCC) 02/29/2012   Back abscess 01/22/2012   Chest pain with moderate risk of acute coronary syndrome 12/09/2009   Shortness of breath 06/10/2009   Essential hypertension, malignant 03/21/2009   Orthostatic hypotension 01/31/2009   CAD (coronary artery disease) 12/23/2008   Normocytic anemia 07/21/2008   Headache(784.0) 07/21/2008   History of TIA (transient ischemic attack) 07/21/2008   Edema 02/27/2008   Diabetes mellitus with complication (HCC) 07/16/2007   Depression 07/16/2007   Dyslipidemia 02/21/2007   Resistant hypertension 02/21/2007   Asthma 02/21/2007   GERD 02/21/2007    LOW BACK PAIN 02/21/2007    Past Surgical History:  Procedure Laterality Date   ABDOMINAL HYSTERECTOMY  age 72   TAH and USO  Leiomyomata   ANTERIOR CERVICAL CORPECTOMY N/A 06/11/2014   Procedure: ANTERIOR CERVICAL CORPECTOMY CERVICAL SIX; WITH CERVICAL FIVE TO CERVICAL SEVEN ARTHRODESIS;  Surgeon: Coletta Memos, MD;  Location: [facility];  Service: Neurosurgery;  Laterality: N/A;   BACK SURGERY  2008   lumb fusion   BREAST BIOPSY Left 01/26/2014   Procedure: EXCISION LEFT BREAST MASS;  Surgeon: Ernestene Mention, MD;  Location: [facility];  Service: General;  Laterality: Left;   BREAST LUMPECTOMY WITH RADIOACTIVE SEED LOCALIZATION Left 07/24/2017   Procedure: LEFT BREAST LUMPECTOMY WITH RADIOACTIVE SEED LOCALIZATION ERAS;  Surgeon: Harriette Bouillon, MD;  Location: [facility];  Service: General;  Laterality: Left;   BREAST LUMPECTOMY WITH RADIOACTIVE SEED LOCALIZATION Left 01/13/2020   Procedure: LEFT BREAST LUMPECTOMY WITH RADIOACTIVE SEED LOCALIZATION;  Surgeon: Harriette Bouillon, MD;  Location: [facility];  Service: General;  Laterality: Left;   CARDIAC CATHETERIZATION     CARDIAC CATHETERIZATION N/A 12/29/2014   Procedure: Left Heart Cath and Coronary Angiography;  Surgeon: Tonny Bollman, MD;  Location: [facility];  Service: Cardiovascular;  Laterality: N/A;   CAROTID ENDARTERECTOMY  march 2012   per Dr. Lynetta Mare   CARPAL TUNNEL RELEASE Left 05/14/2014   Procedure: Left Carpal tunnel release;  Surgeon: Coletta Memos, MD;  Location: [facility];  Service: Neurosurgery;  Laterality: Left;  Left Carpal tunnel release   CARPAL TUNNEL RELEASE Bilateral    CHOLECYSTECTOMY     COLONOSCOPY  06/26/2017   per Dr. Marina Goodell, adenomatous polyp, repeat in 5 yrs     CORONARY STENT PLACEMENT     Dr. Excell Seltzer   DILATION AND CURETTAGE OF UTERUS     ESOPHAGOGASTRODUODENOSCOPY  02-08-06   per Dr. Marina Goodell, gastritis    EYE SURGERY Left June 2016    Cataract   GLAUCOMA SURGERY     with laser, per Dr. Marina Goodell    HARDWARE REMOVAL  10/30/2016   Procedure: HARDWARE REMOVAL;  Surgeon: Kerrin Champagne, MD;  Location: [facility];  Service: Orthopedics;;   LUMBAR FUSION  2008   POLYPECTOMY     POSTERIOR LUMBAR FUSION  10/30/2016   Removal of hardware rods and pedicle screws lumbar four, Extension of fusion L4-5 to L5-S1 with reinsertion of screws at L 4, left L5-S1 transforaminal lumbar interbody fusion, Exploration  left L5 nerve root, bilateral decompressive laminectomy L3-4/notes 10/30/2016   ULNAR NERVE TRANSPOSITION Left 05/14/2014   Procedure: Left Ulnar nerve decompression;  Surgeon: Coletta Memos, MD;  Location: [facility];  Service: Neurosurgery;  Laterality: Left;  Left Ulnar nerve decompression    OB History     Gravida  2   Para  1   Term  1   Preterm      AB  1   Living  1      SAB  1   IAB      Ectopic      Multiple      Live Births               Home Medications    Prior to Admission medications   Medication Sig Start Date End Date Taking? Authorizing Provider  ACCU-CHEK GUIDE test strip 1 each by Other route in the morning and at bedtime. 09/12/19  Yes [provider]  aspirin EC 81 MG tablet Take 1 tablet (81 mg total) by mouth daily. Swallow whole. 09/20/20  Yes Weaver, Scott T, PA-C  chlorthalidone (HYGROTON) 25 MG tablet TAKE ONE TABLET BY MOUTH ONCE DAILY Please schedule an appt with Dr Duke Salvia for further refills. 08/13/22  Yes Chilton Si, MD  ergocalciferol (VITAMIN D2) 50000 UNITS capsule Take 50,000 Units by mouth every Saturday. On Saturday.   Yes [provider]  FARXIGA 10 MG TABS tablet Take 10 mg by mouth every morning. 12/07/19  Yes [provider]  gabapentin (NEURONTIN) 300 MG capsule Take 300 mg by mouth as needed.   Yes [provider]  HUMALOG 100 UNIT/ML injection  12/20/17  Yes [provider]  hydrALAZINE (APRESOLINE) 100 MG tablet TAKE ONE  TABLET BY MOUTH EVERY MORNING and TAKE ONE TABLET BY MOUTH AT NOON and TAKE ONE TABLET BY MOUTH EVERYDAY AT BEDTIME 09/11/22  Yes Chilton Si, MD  Insulin Disposable Pump (V-GO 40) KIT Inject 1 application into the skin See admin instructions. 03/18/20  Yes [provider]  metoprolol succinate (TOPROL-XL) 100 MG 24 hr tablet TAKE ONE TABLET BY MOUTH EVERYDAY AT BEDTIME 03/14/22  Yes Alver Sorrow, NP  montelukast (SINGULAIR) 10 MG tablet Take 10 mg by mouth daily.   Yes [provider]  HYDROcodone bit-homatropine (HYCODAN) 5-1.5 MG/5ML syrup Take 5 mLs by mouth every 4 (four) hours as needed. 09/27/22   Nelwyn Salisbury, MD  irbesartan (AVAPRO) 300 MG tablet TAKE ONE TABLET BY MOUTH EVERYDAY AT BEDTIME 07/13/22   Chilton Si, MD  isosorbide mononitrate (IMDUR) 60 MG 24 hr tablet TAKE 1 AND 1/2 TABLETS BY MOUTH ONCE DAILY 05/14/22   Tonny Bollman, MD  latanoprost (XALATAN) 0.005 % ophthalmic solution Place 1 drop into both eyes at bedtime.  09/26/11   [provider]  nitroGLYCERIN (NITROSTAT) 0.4 MG SL tablet Place 1 tablet (0.4 mg total) under the tongue every 5 (five) minutes as needed for chest pain. 11/24/14   Kilroy, Luke K, PA-C  OZEMPIC, 1 MG/DOSE, 4 MG/3ML SOPN Inject 1 mg into the skin once a week. 12/08/19   [provider]  pantoprazole (PROTONIX) 40 MG tablet TAKE ONE TABLET BY MOUTH EVERY MORNING Needs appointment for further refills 10/11/22   Nelwyn Salisbury, MD  Probiotic Product (PROBIOTIC ADVANCED PO) Take 1 capsule by mouth daily.    [provider]  rosuvastatin (CRESTOR) 40 MG tablet TAKE ONE TABLET BY MOUTH ONCE DAILY 03/15/22   Tonny Bollman, MD  spironolactone (ALDACTONE) 50 MG tablet TAKE ONE TABLET BY MOUTH ONCE DAILY 05/14/22   Tonny Bollman, MD  TYRVAYA 0.03 MG/ACT SOLN Place 1 spray into  both nostrils 2 (two) times daily. 07/20/22   [provider]    Family History Family History  Problem Relation Age of Onset    Hypertension Mother    Hypertension Sister    Hyperlipidemia Sister    Hypertension Brother    Cancer Brother        PROSTATE/LUNG   Diabetes Brother    Heart disease Brother        Before age 260 - Bypass   Varicose Veins Brother    Cancer Father        Prostate and pancreatic    Stomach cancer Maternal Grandmother 65   Heart disease Maternal Grandfather    Breast cancer Maternal Aunt 55   Cancer Maternal Aunt        Stomach   Breast cancer Cousin 1865       maternal first cousin   Prostate cancer Cousin 6673   Breast cancer Cousin 60   Esophageal cancer Cousin    Breast cancer Cousin 40       maternal first cousin's daughter   Coronary artery disease Neg Hx        premature CAD   Colon cancer Neg Hx    Colon polyps Neg Hx    Rectal cancer Neg Hx     Social History Social History   Tobacco Use   Smoking status: Former    Types: Cigarettes    Quit date: 01/22/1967    Years since quitting: 55.7   Smokeless tobacco: Never  Vaping Use   Vaping Use: Never used  Substance Use Topics   Alcohol use: No    Alcohol/week: 0.0 standard drinks of alcohol   Drug use: No     Allergies   Dulaglutide   Review of Systems Review of Systems Per HPI  Physical Exam Triage Vital Signs ED Triage Vitals  Enc Vitals Group     BP 10/14/22 1511 (!) 115/57     Pulse Rate 10/14/22 1511 79     Resp 10/14/22 1511 16     Temp 10/14/22 1511 97.7 F (36.5 C)     Temp Source 10/14/22 1511 Oral     SpO2 10/14/22 1511 98 %     Weight --      Height --      Head Circumference --      Peak Flow --      Pain Score 10/14/22 1542 0     Pain Loc --      Pain Edu? --      Excl. in GC? --    No data found.  Updated Vital Signs BP (!) 115/57 (BP Location: Right Arm)   Pulse 79   Temp 97.7 F (36.5 C) (Oral)   Resp 16   SpO2 98%   Visual Acuity Right Eye Distance:   Left Eye Distance:   Bilateral Distance:    Right Eye Near:   Left Eye Near:    Bilateral Near:     Physical  Exam Constitutional:      General: She is not in acute distress.    Appearance: Normal appearance. She is not toxic-appearing or diaphoretic.  HENT:     Head: Normocephalic and atraumatic.     Right Ear: Tympanic membrane and external ear normal. No drainage, swelling or tenderness. No middle ear effusion. There is impacted cerumen. Tympanic membrane is not perforated, erythematous or bulging.     Left Ear: Tympanic membrane, ear canal and external ear normal.  Ears:     Comments: Patient had impacted cerumen to right external canal on original physical exam.  Ear was irrigated successfully with complete removal of cerumen.  On second physical exam, ear canal and TM appears normal and intact. Eyes:     Extraocular Movements: Extraocular movements intact.     Conjunctiva/sclera: Conjunctivae normal.  Pulmonary:     Effort: Pulmonary effort is normal.  Neurological:     General: No focal deficit present.     Mental Status: She is alert and oriented to person, place, and time. Mental status is at baseline.  Psychiatric:        Mood and Affect: Mood normal.        Behavior: Behavior normal.        Thought Content: Thought content normal.        Judgment: Judgment normal.      UC Treatments / Results  Labs (all labs ordered are listed, but only abnormal results are displayed) Labs Reviewed - No data to display  EKG   Radiology No results found.  Procedures Procedures (including critical care time)  Medications Ordered in UC Medications - No data to display  Initial Impression / Assessment and Plan / UC Course  I have reviewed the triage vital signs and the nursing notes.  Pertinent labs & imaging results that were available during my care of the patient were reviewed by me and considered in my medical decision making (see chart for details).     Patient had impacted cerumen to right external canal.  Ear was irrigated successfully with complete removal of cerumen and  patient immediately had improvement in hearing.  Therefore, suspect this was because of patient's decreased hearing in the right ear.  Left ear decreased hearing is baseline so no further workup is necessary for this at this time.  No signs of ear infection on second physical exam.  Patient advised to follow-up with any persistent symptoms.  Patient verbalized understanding and was agreeable with plan. Final Clinical Impressions(s) / UC Diagnoses   Final diagnoses:  Impacted cerumen of right ear   Discharge Instructions   None    ED Prescriptions   None    PDMP not reviewed this encounter.   Gustavus Bryant, [place] 10/14/22 1616

## 2022-10-14 NOTE — ED Notes (Signed)
Pt refused to get off of phone therefore left room and will return later

## 2022-10-14 NOTE — ED Triage Notes (Signed)
Pt reports no hearing in her right ear x 3 days.

## 2022-10-15 ENCOUNTER — Ambulatory Visit
Admission: RE | Admit: 2022-10-15 | Discharge: 2022-10-15 | Disposition: A | Discharge status: Home / Self Care | Payer: PPO | Source: Ambulatory Visit | Attending: Orthopedic Surgery | Admitting: Orthopedic Surgery

## 2022-10-15 DIAGNOSIS — M545 Low back pain, unspecified: Secondary | ICD-10-CM | POA: Diagnosis not present

## 2022-10-15 DIAGNOSIS — M549 Dorsalgia, unspecified: Secondary | ICD-10-CM | POA: Diagnosis not present

## 2022-10-15 DIAGNOSIS — M48061 Spinal stenosis, lumbar region without neurogenic claudication: Secondary | ICD-10-CM | POA: Diagnosis not present

## 2022-10-16 DIAGNOSIS — R2681 Unsteadiness on feet: Secondary | ICD-10-CM | POA: Diagnosis not present

## 2022-10-16 DIAGNOSIS — M546 Pain in thoracic spine: Secondary | ICD-10-CM | POA: Diagnosis not present

## 2022-10-16 DIAGNOSIS — R531 Weakness: Secondary | ICD-10-CM | POA: Diagnosis not present

## 2022-10-16 DIAGNOSIS — M545 Low back pain, unspecified: Secondary | ICD-10-CM | POA: Diagnosis not present

## 2022-10-17 DIAGNOSIS — H04123 Dry eye syndrome of bilateral lacrimal glands: Secondary | ICD-10-CM | POA: Diagnosis not present

## 2022-10-17 DIAGNOSIS — H04122 Dry eye syndrome of left lacrimal gland: Secondary | ICD-10-CM | POA: Diagnosis not present

## 2022-10-17 DIAGNOSIS — H04121 Dry eye syndrome of right lacrimal gland: Secondary | ICD-10-CM | POA: Diagnosis not present

## 2022-10-18 DIAGNOSIS — R531 Weakness: Secondary | ICD-10-CM | POA: Diagnosis not present

## 2022-10-18 DIAGNOSIS — M545 Low back pain, unspecified: Secondary | ICD-10-CM | POA: Diagnosis not present

## 2022-10-18 DIAGNOSIS — M546 Pain in thoracic spine: Secondary | ICD-10-CM | POA: Diagnosis not present

## 2022-10-18 DIAGNOSIS — R2681 Unsteadiness on feet: Secondary | ICD-10-CM | POA: Diagnosis not present

## 2022-10-22 DIAGNOSIS — M545 Low back pain, unspecified: Secondary | ICD-10-CM | POA: Diagnosis not present

## 2022-10-30 DIAGNOSIS — M47816 Spondylosis without myelopathy or radiculopathy, lumbar region: Secondary | ICD-10-CM | POA: Diagnosis not present

## 2022-10-30 DIAGNOSIS — M47814 Spondylosis without myelopathy or radiculopathy, thoracic region: Secondary | ICD-10-CM | POA: Diagnosis not present

## 2022-11-08 DIAGNOSIS — M25511 Pain in right shoulder: Secondary | ICD-10-CM | POA: Diagnosis not present

## 2022-11-08 DIAGNOSIS — M546 Pain in thoracic spine: Secondary | ICD-10-CM | POA: Diagnosis not present

## 2022-11-08 DIAGNOSIS — M67911 Unspecified disorder of synovium and tendon, right shoulder: Secondary | ICD-10-CM | POA: Diagnosis not present

## 2022-11-09 ENCOUNTER — Telehealth: Payer: Self-pay

## 2022-11-09 ENCOUNTER — Other Ambulatory Visit (HOSPITAL_BASED_OUTPATIENT_CLINIC_OR_DEPARTMENT_OTHER): Payer: Self-pay | Admitting: Cardiovascular Disease

## 2022-11-09 NOTE — Telephone Encounter (Signed)
Rx request sent to pharmacy.  

## 2022-11-09 NOTE — Progress Notes (Unsigned)
Care Management & Coordination Services Pharmacy Team  Reason for Encounter: Medication coordination and delivery  Contacted patient to discuss medications and coordinate delivery from [facility]. {US HC Outreach:28874}  Cycle dispensing form sent to *** for review.   Last adherence delivery date:10/24/2022      Patient is due for next adherence delivery on: 11/22/2022 SCHEDULE FOLLOW UP This delivery to include: Adherence Packaging  30 Days  Vitamin D2 50,000 units once weekly at breakfast on Sat. Spironolactone 50 mg 1 tablet at breakfast Rosuvastatin 40 mg 1 tablet at breakfast Isosorbide ER 60 mg 1.5 tablets at breakfast Montelukast 10 mg 1 tablet at bedtime Pantoprazole 40 mg 1 tablet daily at breakfast Metoprolol 100 mg 1 tablet at bedtime Chlorthalidone 25 mg 1 tablet at breakfast Hydralazine 100 mg 1 tablet at breakfast, lunch and bedtime Irbesartan 300 mg 1 tablet at bedtime Aspirin 81 mg at breakfast  Patient declined the following medications this month: V-go 40 use as directed    {Delivery date:25786}  Any concerns about your medications? {yes/no:20286}  How often do you forget or accidentally miss a dose? {Missed doses:25554}  Is patient in packaging Yes  If yes  What is the date on your next pill pack?  Any concerns or issues with your packaging?   Recent blood pressure readings are as follows:***  Recent blood glucose readings are as follows:***   Chart review: Recent office visits:  None  Recent consult visits:  None  Hospital visits:  Patient was seen at [facility] on 10/14/2022 (48 min) due to Impacted cerumen of right ear .    [facility] None Medication Changes at Hospital Discharge: None Medications Discontinued at Hospital Discharge: None Medications that remain the same after Hospital Discharge:??  -All other medications will remain the same.    Medications: Outpatient  Encounter Medications as of 11/09/2022  Medication Sig   ACCU-CHEK GUIDE test strip 1 each by Other route in the morning and at bedtime.   aspirin EC 81 MG tablet Take 1 tablet (81 mg total) by mouth daily. Swallow whole.   chlorthalidone (HYGROTON) 25 MG tablet TAKE ONE TABLET BY MOUTH ONCE DAILY Please schedule an appt with Dr Duke Salvia for further refills.   ergocalciferol (VITAMIN D2) 50000 UNITS capsule Take 50,000 Units by mouth every Saturday. On Saturday.   FARXIGA 10 MG TABS tablet Take 10 mg by mouth every morning.   gabapentin (NEURONTIN) 300 MG capsule Take 300 mg by mouth as needed.   HUMALOG 100 UNIT/ML injection    hydrALAZINE (APRESOLINE) 100 MG tablet TAKE ONE TABLET BY MOUTH EVERY MORNING and TAKE ONE TABLET BY MOUTH AT NOON and TAKE ONE TABLET BY MOUTH EVERYDAY AT BEDTIME   HYDROcodone bit-homatropine (HYCODAN) 5-1.5 MG/5ML syrup Take 5 mLs by mouth every 4 (four) hours as needed.   Insulin Disposable Pump (V-GO 40) KIT Inject 1 application into the skin See admin instructions.   irbesartan (AVAPRO) 300 MG tablet TAKE ONE TABLET BY MOUTH EVERYDAY AT BEDTIME   isosorbide mononitrate (IMDUR) 60 MG 24 hr tablet TAKE 1 AND 1/2 TABLETS BY MOUTH ONCE DAILY   latanoprost (XALATAN) 0.005 % ophthalmic solution Place 1 drop into both eyes at bedtime.    metoprolol succinate (TOPROL-XL) 100 MG 24 hr tablet TAKE ONE TABLET BY MOUTH EVERYDAY AT BEDTIME   montelukast (SINGULAIR) 10 MG tablet Take 10 mg by mouth daily.   nitroGLYCERIN (NITROSTAT) 0.4 MG SL tablet Place 1 tablet (0.4 mg  total) under the tongue every 5 (five) minutes as needed for chest pain.   OZEMPIC, 1 MG/DOSE, 4 MG/3ML SOPN Inject 1 mg into the skin once a week.   pantoprazole (PROTONIX) 40 MG tablet TAKE ONE TABLET BY MOUTH EVERY MORNING Needs appointment for further refills   Probiotic Product (PROBIOTIC ADVANCED PO) Take 1 capsule by mouth daily.   rosuvastatin (CRESTOR) 40 MG tablet TAKE ONE TABLET BY MOUTH ONCE DAILY    spironolactone (ALDACTONE) 50 MG tablet TAKE ONE TABLET BY MOUTH ONCE DAILY   TYRVAYA 0.03 MG/ACT SOLN Place 1 spray into both nostrils 2 (two) times daily.   No facility-administered encounter medications on file as of 11/09/2022.   BP Readings from Last 3 Encounters:  10/14/22 (!) 115/57  09/27/22 (!) 98/48  08/14/22 (!) 135/50    Pulse Readings from Last 3 Encounters:  10/14/22 79  09/27/22 82  08/14/22 67    Lab Results  Component Value Date/Time   HGBA1C 15.2 (H) 02/20/2019 10:22 AM   HGBA1C 9.3 (H) 03/27/2017 04:15 PM   Lab Results  Component Value Date   CREATININE 0.92 03/23/2021   BUN 14 03/23/2021   GFR 68.46 02/20/2019   GFRNONAA >60 03/23/2021   GFRAA 59 (L) 03/23/2020   NA 146 (H) 03/23/2021   K 3.9 03/23/2021   CALCIUM 9.6 03/23/2021   CO2 26 03/23/2021   Inetta Fermo CMA  Clinical Pharmacist Assistant [phone]

## 2022-11-15 DIAGNOSIS — M47816 Spondylosis without myelopathy or radiculopathy, lumbar region: Secondary | ICD-10-CM | POA: Diagnosis not present

## 2022-11-28 DIAGNOSIS — Z1231 Encounter for screening mammogram for malignant neoplasm of breast: Secondary | ICD-10-CM | POA: Diagnosis not present

## 2022-12-10 ENCOUNTER — Other Ambulatory Visit (HOSPITAL_BASED_OUTPATIENT_CLINIC_OR_DEPARTMENT_OTHER): Payer: Self-pay | Admitting: Family

## 2022-12-10 DIAGNOSIS — I10 Essential (primary) hypertension: Secondary | ICD-10-CM

## 2022-12-10 NOTE — Telephone Encounter (Signed)
Rx(s) sent to pharmacy electronically.  

## 2022-12-12 ENCOUNTER — Telehealth: Payer: Self-pay

## 2022-12-12 NOTE — Progress Notes (Signed)
Care Management & Coordination Services Pharmacy Team  Reason for Encounter: Medication coordination and delivery  Contacted patient to discuss medications and coordinate delivery from [facility]. Spoke with patient on 12/12/2022   Cycle dispensing form sent to [facility] for review.   Last adherence delivery date: 11/22/2022      Patient is due for next adherence delivery on: 12/24/2022  This delivery to include: Adherence Packaging  30 Days  Vitamin D2 50,000 units once weekly at breakfast on Sat. Spironolactone 50 mg 1 tablet at breakfast Rosuvastatin 40 mg 1 tablet at breakfast Isosorbide ER 60 mg 1.5 tablets at breakfast Montelukast 10 mg 1 tablet at bedtime Pantoprazole 40 mg 1 tablet daily at breakfast Metoprolol 100 mg 1 tablet at bedtime Chlorthalidone 25 mg 1 tablet at breakfast Hydralazine 100 mg 1 tablet at breakfast, lunch and bedtime Irbesartan 300 mg 1 tablet at bedtime Aspirin 81 mg at breakfast V-go 40 use as directed   Patient declined the following medications this month: None  Confirmed delivery date of 12/24/2022, advised patient that pharmacy will contact them the morning of delivery.  Any concerns about your medications? Patient declines today  Recent blood pressure readings are as follows: Patient declines today  Recent blood glucose readings are as follows: Patient declines today   Chart review: Recent office visits:  None  Recent consult visits:  None  Hospital visits:  None  Medications: Outpatient Encounter Medications as of 12/12/2022  Medication Sig   ACCU-CHEK GUIDE test strip 1 each by Other route in the morning and at bedtime.   aspirin EC 81 MG tablet Take 1 tablet (81 mg total) by mouth daily. Swallow whole.   chlorthalidone (HYGROTON) 25 MG tablet TAKE ONE TABLET BY MOUTH ONCE DAILY Please schedule an appt with Dr Duke Salvia for further refills.   ergocalciferol (VITAMIN D2) 50000 UNITS capsule Take 50,000 Units by  mouth every Saturday. On Saturday.   FARXIGA 10 MG TABS tablet Take 10 mg by mouth every morning.   gabapentin (NEURONTIN) 300 MG capsule Take 300 mg by mouth as needed.   HUMALOG 100 UNIT/ML injection    hydrALAZINE (APRESOLINE) 100 MG tablet TAKE ONE TABLET BY MOUTH EVERY MORNING and TAKE ONE TABLET BY MOUTH AT NOON and TAKE ONE TABLET BY MOUTH EVERYDAY AT BEDTIME   HYDROcodone bit-homatropine (HYCODAN) 5-1.5 MG/5ML syrup Take 5 mLs by mouth every 4 (four) hours as needed.   Insulin Disposable Pump (V-GO 40) KIT Inject 1 application into the skin See admin instructions.   irbesartan (AVAPRO) 300 MG tablet TAKE ONE TABLET BY MOUTH EVERYDAY AT BEDTIME   isosorbide mononitrate (IMDUR) 60 MG 24 hr tablet TAKE 1 AND 1/2 TABLETS BY MOUTH ONCE DAILY   latanoprost (XALATAN) 0.005 % ophthalmic solution Place 1 drop into both eyes at bedtime.    metoprolol succinate (TOPROL-XL) 100 MG 24 hr tablet TAKE ONE TABLET BY MOUTH EVERYDAY AT BEDTIME   montelukast (SINGULAIR) 10 MG tablet Take 10 mg by mouth daily.   nitroGLYCERIN (NITROSTAT) 0.4 MG SL tablet Place 1 tablet (0.4 mg total) under the tongue every 5 (five) minutes as needed for chest pain.   OZEMPIC, 1 MG/DOSE, 4 MG/3ML SOPN Inject 1 mg into the skin once a week.   pantoprazole (PROTONIX) 40 MG tablet TAKE ONE TABLET BY MOUTH EVERY MORNING Needs appointment for further refills   Probiotic Product (PROBIOTIC ADVANCED PO) Take 1 capsule by mouth daily.   rosuvastatin (CRESTOR) 40 MG tablet TAKE ONE TABLET BY MOUTH  ONCE DAILY   spironolactone (ALDACTONE) 50 MG tablet TAKE ONE TABLET BY MOUTH ONCE DAILY   TYRVAYA 0.03 MG/ACT SOLN Place 1 spray into both nostrils 2 (two) times daily.   No facility-administered encounter medications on file as of 12/12/2022.   BP Readings from Last 3 Encounters:  10/14/22 (!) 115/57  09/27/22 (!) 98/48  08/14/22 (!) 135/50    Pulse Readings from Last 3 Encounters:  10/14/22 79  09/27/22 82  08/14/22 67    Lab  Results  Component Value Date/Time   HGBA1C 15.2 (H) 02/20/2019 10:22 AM   HGBA1C 9.3 (H) 03/27/2017 04:15 PM   Lab Results  Component Value Date   CREATININE 0.92 03/23/2021   BUN 14 03/23/2021   GFR 68.46 02/20/2019   GFRNONAA >60 03/23/2021   GFRAA 59 (L) 03/23/2020   NA 146 (H) 03/23/2021   K 3.9 03/23/2021   CALCIUM 9.6 03/23/2021   CO2 26 03/23/2021   Inetta Fermo CMA  Clinical Pharmacist Assistant [phone]

## 2022-12-18 ENCOUNTER — Other Ambulatory Visit (HOSPITAL_BASED_OUTPATIENT_CLINIC_OR_DEPARTMENT_OTHER): Payer: Self-pay | Admitting: Cardiovascular Disease

## 2022-12-18 NOTE — Telephone Encounter (Signed)
Patient of Dr. Cooper. Please review for refill. Thank you!  

## 2023-01-07 ENCOUNTER — Encounter: Payer: Self-pay | Admitting: *Deleted

## 2023-01-07 ENCOUNTER — Other Ambulatory Visit: Payer: Self-pay

## 2023-01-07 ENCOUNTER — Ambulatory Visit
Admission: EM | Admit: 2023-01-07 | Discharge: 2023-01-07 | Disposition: A | Discharge status: Home / Self Care | Payer: PPO | Attending: Family Medicine | Admitting: Family Medicine

## 2023-01-07 DIAGNOSIS — H9121 Sudden idiopathic hearing loss, right ear: Secondary | ICD-10-CM | POA: Diagnosis not present

## 2023-01-07 DIAGNOSIS — I10 Essential (primary) hypertension: Secondary | ICD-10-CM

## 2023-01-07 NOTE — ED Provider Notes (Addendum)
[name]    CSN: [id number] Arrival date & time: 01/07/23  1357      History   Chief Complaint Chief Complaint  Patient presents with   Hearing Problem    HPI [name] is a 77 y.o. female.   HPI Here for sudden hearing loss that began bothering her yesterday in her right ear.  She has had loss of hearing in her left ear for about a year now.  No fever or cough and no ear pain. She states that she has talked with your primary care about the hearing loss in the left ear and had a hearing test in his office.   Past Medical History:  Diagnosis Date   Abnormal nuclear stress test 12/29/2014   Allergy    Anemia, unspecified    Arthritis    Blood transfusion without reported diagnosis    2018   CAD (coronary artery disease)    sees. Dr. Excell Seltzer. s/p cath with PCI of RCA (xience des) June 2010. Pt also with LAD and D2 dzs-NL FFR in both areas med Rx   Carotid artery occlusion    Cataract    sees Dr. Elmer Picker    Depressive disorder, not elsewhere classified    Duodenal nodule    Edema    Fibromyalgia    Gastric polyps    COLON   GERD (gastroesophageal reflux disease)    Glaucoma    narrow angle, sees Dr. Derek Mound)    Hearing loss    left ear   Heart murmur    questionable   Hemorrhoids    HSV (herpes simplex virus) anogenital infection 05/2014   HTN (hypertension)    Hyperlipidemia    Low back pain    she sees Dr. Otelia Sergeant, also Dr. Alvester Morin for pain medications and Zonia Kief PA for injections    Obesity    Osteopenia 01/2018   T score -1.6 FRAX 7.1% / 1.4%   Peripheral vascular disease (HCC)    Resistant hypertension 02/21/2007   Qualifier: Diagnosis of  By: Briscoe Burns CMA, Alvy Beal     Stroke [facility])    TIA  "many yrs ago"     TIA (transient ischemic attack)    also Hx of it.    Type II or unspecified type diabetes mellitus without mention of complication, not stated as uncontrolled    sees Dr. Ardyth Harps     Patient  Active Problem List   Diagnosis Date Noted   Environmental and seasonal allergies 09/27/2022   Asymptomatic varicose veins of both lower extremities 11/04/2020   Ductal carcinoma in situ (DCIS) of left breast 01/06/2020   Right-sided thoracic back pain 07/01/2019   Hyperlipidemia LDL goal <70 07/17/2017   History of lumbar spinal fusion    History of fusion of lumbar spine    Anemia due to blood loss 11/02/2016    Class: Acute   Lethargy 10/31/2016   AKI (acute kidney injury) (HCC) 10/31/2016   (HFpEF) heart failure with preserved ejection fraction (HCC) 10/31/2016   Cough    Leukocytosis    Spondylolisthesis, lumbar region 10/30/2016    Class: Chronic   Spinal stenosis of lumbar region 10/30/2016    Class: Chronic   Retained orthopedic hardware 10/30/2016    Class: Chronic   Spinal stenosis of lumbar region with neurogenic claudication 10/30/2016   Genetic testing 09/14/2015   Atypical ductal hyperplasia of left breast 08/24/2015   Family history of breast cancer  Family history of prostate cancer    Family history of stomach cancer    Abnormal nuclear stress test 12/29/2014   Cervical spondylosis with myelopathy and radiculopathy 06/11/2014   Carpal tunnel syndrome 05/14/2014   Aftercare following surgery of the circulatory system, NEC 03/11/2014   Pain of right lower extremity 03/11/2014   Atypical lobular hyperplasia of left breast 02/12/2014   Fibromyalgia 09/21/2013   Carotid artery disease (HCC) 02/29/2012   Back abscess 01/22/2012   Chest pain with moderate risk of acute coronary syndrome 12/09/2009   Shortness of breath 06/10/2009   Essential hypertension, malignant 03/21/2009   Orthostatic hypotension 01/31/2009   CAD (coronary artery disease) 12/23/2008   Normocytic anemia 07/21/2008   Headache(784.0) 07/21/2008   History of TIA (transient ischemic attack) 07/21/2008   Edema 02/27/2008   Diabetes mellitus with complication (HCC) 07/16/2007   Depression  07/16/2007   Dyslipidemia 02/21/2007   Resistant hypertension 02/21/2007   Asthma 02/21/2007   GERD 02/21/2007   LOW BACK PAIN 02/21/2007    Past Surgical History:  Procedure Laterality Date   ABDOMINAL HYSTERECTOMY  age 27   TAH and USO  Leiomyomata   ANTERIOR CERVICAL CORPECTOMY N/A 06/11/2014   Procedure: ANTERIOR CERVICAL CORPECTOMY CERVICAL SIX; WITH CERVICAL FIVE TO CERVICAL SEVEN ARTHRODESIS;  Surgeon: Coletta Memos, MD;  Location: [facility];  Service: Neurosurgery;  Laterality: N/A;   BACK SURGERY  2008   lumb fusion   BREAST BIOPSY Left 01/26/2014   Procedure: EXCISION LEFT BREAST MASS;  Surgeon: Ernestene Mention, MD;  Location: [facility];  Service: General;  Laterality: Left;   BREAST LUMPECTOMY WITH RADIOACTIVE SEED LOCALIZATION Left 07/24/2017   Procedure: LEFT BREAST LUMPECTOMY WITH RADIOACTIVE SEED LOCALIZATION ERAS;  Surgeon: Harriette Bouillon, MD;  Location: [facility];  Service: General;  Laterality: Left;   BREAST LUMPECTOMY WITH RADIOACTIVE SEED LOCALIZATION Left 01/13/2020   Procedure: LEFT BREAST LUMPECTOMY WITH RADIOACTIVE SEED LOCALIZATION;  Surgeon: Harriette Bouillon, MD;  Location: [facility];  Service: General;  Laterality: Left;   CARDIAC CATHETERIZATION     CARDIAC CATHETERIZATION N/A 12/29/2014   Procedure: Left Heart Cath and Coronary Angiography;  Surgeon: Tonny Bollman, MD;  Location: [facility];  Service: Cardiovascular;  Laterality: N/A;   CAROTID ENDARTERECTOMY  march 2012   per Dr. Lynetta Mare   CARPAL TUNNEL RELEASE Left 05/14/2014   Procedure: Left Carpal tunnel release;  Surgeon: Coletta Memos, MD;  Location: [facility];  Service: Neurosurgery;  Laterality: Left;  Left Carpal tunnel release   CARPAL TUNNEL RELEASE Bilateral    CHOLECYSTECTOMY     COLONOSCOPY  06/26/2017   per Dr. Marina Goodell, adenomatous polyp, repeat in 5 yrs     CORONARY STENT PLACEMENT     Dr. Excell Seltzer   DILATION AND CURETTAGE OF  UTERUS     ESOPHAGOGASTRODUODENOSCOPY  02-08-06   per Dr. Marina Goodell, gastritis    EYE SURGERY Left June 2016   Cataract   GLAUCOMA SURGERY     with laser, per Dr. Marina Goodell    HARDWARE REMOVAL  10/30/2016   Procedure: HARDWARE REMOVAL;  Surgeon: Kerrin Champagne, MD;  Location: [facility];  Service: Orthopedics;;   LUMBAR FUSION  2008   POLYPECTOMY     POSTERIOR LUMBAR FUSION  10/30/2016   Removal of hardware rods and pedicle screws lumbar four, Extension of fusion L4-5 to L5-S1 with reinsertion of screws at L 4, left L5-S1 transforaminal lumbar interbody fusion, Exploration  left  L5 nerve root, bilateral decompressive laminectomy L3-4/notes 10/30/2016   ULNAR NERVE TRANSPOSITION Left 05/14/2014   Procedure: Left Ulnar nerve decompression;  Surgeon: Coletta Memos, MD;  Location: [facility];  Service: Neurosurgery;  Laterality: Left;  Left Ulnar nerve decompression    OB History     Gravida  2   Para  1   Term  1   Preterm      AB  1   Living  1      SAB  1   IAB      Ectopic      Multiple      Live Births               Home Medications    Prior to Admission medications   Medication Sig Start Date End Date Taking? Authorizing Provider  ACCU-CHEK GUIDE test strip 1 each by Other route in the morning and at bedtime. 09/12/19  Yes [provider]  aspirin EC 81 MG tablet Take 1 tablet (81 mg total) by mouth daily. Swallow whole. 09/20/20  Yes Weaver, Scott T, PA-C  chlorthalidone (HYGROTON) 25 MG tablet TAKE ONE TABLET BY MOUTH ONCE DAILY Please schedule an appt with Dr Duke Salvia for further refills. 11/09/22  Yes Chilton Si, MD  ergocalciferol (VITAMIN D2) 50000 UNITS capsule Take 50,000 Units by mouth every Saturday. On Saturday.   Yes [provider]  FARXIGA 10 MG TABS tablet Take 10 mg by mouth every morning. 12/07/19  Yes [provider]  HUMALOG 100 UNIT/ML injection  12/20/17  Yes [provider]  hydrALAZINE (APRESOLINE) 100 MG tablet  TAKE ONE TABLET BY MOUTH EVERY MORNING and TAKE ONE TABLET BY MOUTH AT NOON and TAKE ONE TABLET BY MOUTH EVERYDAY AT BEDTIME 12/19/22  Yes Tonny Bollman, MD  HYDROcodone bit-homatropine (HYCODAN) 5-1.5 MG/5ML syrup Take 5 mLs by mouth every 4 (four) hours as needed. 09/27/22  Yes Nelwyn Salisbury, MD  Insulin Disposable Pump (V-GO 40) KIT Inject 1 application into the skin See admin instructions. 03/18/20  Yes [provider]  irbesartan (AVAPRO) 300 MG tablet TAKE ONE TABLET BY MOUTH EVERYDAY AT BEDTIME 07/13/22  Yes Chilton Si, MD  isosorbide mononitrate (IMDUR) 60 MG 24 hr tablet TAKE 1 AND 1/2 TABLETS BY MOUTH ONCE DAILY 05/14/22  Yes Tonny Bollman, MD  latanoprost (XALATAN) 0.005 % ophthalmic solution Place 1 drop into both eyes at bedtime.  09/26/11  Yes [provider]  metoprolol succinate (TOPROL-XL) 100 MG 24 hr tablet TAKE ONE TABLET BY MOUTH EVERYDAY AT BEDTIME 12/10/22  Yes Weaver, Scott T, PA-C  montelukast (SINGULAIR) 10 MG tablet Take 10 mg by mouth daily.   Yes [provider]  OZEMPIC, 1 MG/DOSE, 4 MG/3ML SOPN Inject 1 mg into the skin once a week. 12/08/19  Yes [provider]  pantoprazole (PROTONIX) 40 MG tablet TAKE ONE TABLET BY MOUTH EVERY MORNING Needs appointment for further refills 10/11/22  Yes Nelwyn Salisbury, MD  Probiotic Product (PROBIOTIC ADVANCED PO) Take 1 capsule by mouth daily.   Yes [provider]  rosuvastatin (CRESTOR) 40 MG tablet TAKE ONE TABLET BY MOUTH ONCE DAILY 03/15/22  Yes Tonny Bollman, MD  spironolactone (ALDACTONE) 50 MG tablet TAKE ONE TABLET BY MOUTH ONCE DAILY 05/14/22  Yes Tonny Bollman, MD  TYRVAYA 0.03 MG/ACT SOLN Place 1 spray into both nostrils 2 (two) times daily. 07/20/22  Yes [provider]  gabapentin (NEURONTIN) 300 MG capsule Take 300 mg by mouth  as needed.    [provider]  nitroGLYCERIN (NITROSTAT) 0.4 MG SL tablet Place 1 tablet (0.4 mg total) under the tongue every 5  (five) minutes as needed for chest pain. 11/24/14   Abelino Derrick, PA-C    Family History Family History  Problem Relation Age of Onset   Hypertension Mother    Hypertension Sister    Hyperlipidemia Sister    Hypertension Brother    Cancer Brother        PROSTATE/LUNG   Diabetes Brother    Heart disease Brother        Before age 35 - Bypass   Varicose Veins Brother    Cancer Father        Prostate and pancreatic    Stomach cancer Maternal Grandmother 65   Heart disease Maternal Grandfather    Breast cancer Maternal Aunt 55   Cancer Maternal Aunt        Stomach   Breast cancer Cousin 62       maternal first cousin   Prostate cancer Cousin 23   Breast cancer Cousin 60   Esophageal cancer Cousin    Breast cancer Cousin 40       maternal first cousin's daughter   Coronary artery disease Neg Hx        premature CAD   Colon cancer Neg Hx    Colon polyps Neg Hx    Rectal cancer Neg Hx     Social History Social History   Tobacco Use   Smoking status: Former    Types: Cigarettes    Quit date: 01/22/1967    Years since quitting: 55.9   Smokeless tobacco: Never  Vaping Use   Vaping Use: Never used  Substance Use Topics   Alcohol use: No    Alcohol/week: 0.0 standard drinks of alcohol   Drug use: No     Allergies   Dulaglutide   Review of Systems Review of Systems   Physical Exam Triage Vital Signs ED Triage Vitals  Enc Vitals Group     BP 01/07/23 1601 116/68     Pulse Rate 01/07/23 1601 68     Resp 01/07/23 1601 20     Temp 01/07/23 1601 97.6 F (36.4 C)     Temp src --      SpO2 01/07/23 1601 94 %     Weight --      Height --      Head Circumference --      Peak Flow --      Pain Score 01/07/23 1557 0     Pain Loc --      Pain Edu? --      Excl. in GC? --    No data found.  Updated Vital Signs BP 116/68   Pulse 68   Temp 97.6 F (36.4 C)   Resp 20   SpO2 94%   Visual Acuity Right Eye Distance:   Left Eye Distance:   Bilateral  Distance:    Right Eye Near:   Left Eye Near:    Bilateral Near:     Physical Exam Vitals reviewed.  Constitutional:      General: She is not in acute distress.    Appearance: She is not ill-appearing, toxic-appearing or diaphoretic.  HENT:     Right Ear: Tympanic membrane and ear canal normal.     Left Ear: Tympanic membrane and ear canal normal.     Nose: Nose normal.  Cardiovascular:  Rate and Rhythm: Normal rate and regular rhythm.  Skin:    Coloration: Skin is not pale.  Neurological:     General: No focal deficit present.     Mental Status: She is alert and oriented to person, place, and time.  Psychiatric:        Behavior: Behavior normal.      UC Treatments / Results  Labs (all labs ordered are listed, but only abnormal results are displayed) Labs Reviewed  BASIC METABOLIC PANEL  CBC WITH DIFFERENTIAL/PLATELET  RPR  TSH    EKG   Radiology No results found.  Procedures Procedures (including critical care time)  Medications Ordered in UC Medications - No data to display  Initial Impression / Assessment and Plan / UC Course  I have reviewed the triage vital signs and the nursing notes.  Pertinent labs & imaging results that were available during my care of the patient were reviewed by me and considered in my medical decision making (see chart for details).      Ears are clear today.  There is no cerumen in either canal.  Tympanic membranes are gray and shiny and without any abnormality.  CBC, BMP, TSH and RPR are drawn to assess for sudden hearing loss.  She does also happen to have hypertension that is very well-controlled today.  I have asked her to call her primary care to be assessed and to see if she needs referral to hearing specialist. Final Clinical Impressions(s) / UC Diagnoses   Final diagnoses:  Sudden right hearing loss  Essential hypertension, benign   Discharge Instructions   None    ED Prescriptions   None    PDMP not  reviewed this encounter.   Zenia Resides, MD 01/07/23 1614    Zenia Resides, MD 01/07/23 [id number]

## 2023-01-07 NOTE — Discharge Instructions (Signed)
We have drawn blood to check a blood count, electrolytes and sugar, thyroid and a test for syphilis.  Staff will call you if anything is abnormal.

## 2023-01-07 NOTE — ED Triage Notes (Signed)
Pt reports recent decreased hearing in RT ear. Pt reports she lost hearing in Lt ear one year ago.

## 2023-01-08 DIAGNOSIS — M791 Myalgia, unspecified site: Secondary | ICD-10-CM | POA: Diagnosis not present

## 2023-01-08 DIAGNOSIS — M545 Low back pain, unspecified: Secondary | ICD-10-CM | POA: Diagnosis not present

## 2023-01-08 DIAGNOSIS — M546 Pain in thoracic spine: Secondary | ICD-10-CM | POA: Diagnosis not present

## 2023-01-08 LAB — CBC WITH DIFFERENTIAL/PLATELET
Basophils Absolute: 0 10*3/uL (ref 0.0–0.2)
Basos: 0 %
EOS (ABSOLUTE): 0.1 10*3/uL (ref 0.0–0.4)
Eos: 1 %
Hematocrit: 34.4 % (ref 34.0–46.6)
Hemoglobin: 10.8 g/dL — ABNORMAL LOW (ref 11.1–15.9)
Immature Grans (Abs): 0 10*3/uL (ref 0.0–0.1)
Immature Granulocytes: 0 %
Lymphocytes Absolute: 1.6 10*3/uL (ref 0.7–3.1)
Lymphs: 22 %
MCH: 28.2 pg (ref 26.6–33.0)
MCHC: 31.4 g/dL — ABNORMAL LOW (ref 31.5–35.7)
MCV: 90 fL (ref 79–97)
Monocytes Absolute: 0.8 10*3/uL (ref 0.1–0.9)
Monocytes: 11 %
Neutrophils Absolute: 4.6 10*3/uL (ref 1.4–7.0)
Neutrophils: 66 %
Platelets: 203 10*3/uL (ref 150–450)
RBC: 3.83 x10E6/uL (ref 3.77–5.28)
RDW: 14.8 % (ref 11.7–15.4)
WBC: 7.1 10*3/uL (ref 3.4–10.8)

## 2023-01-08 LAB — BASIC METABOLIC PANEL
BUN/Creatinine Ratio: 20 (ref 12–28)
BUN: 34 mg/dL — ABNORMAL HIGH (ref 8–27)
CO2: 22 mmol/L (ref 20–29)
Calcium: 10.1 mg/dL (ref 8.7–10.3)
Chloride: 105 mmol/L (ref 96–106)
Creatinine, Ser: 1.72 mg/dL — ABNORMAL HIGH (ref 0.57–1.00)
Glucose: 89 mg/dL (ref 70–99)
Potassium: 4 mmol/L (ref 3.5–5.2)
Sodium: 144 mmol/L (ref 134–144)
eGFR: 31 mL/min/{1.73_m2} — ABNORMAL LOW (ref >59)

## 2023-01-08 LAB — RPR: RPR Ser Ql: NONREACTIVE

## 2023-01-08 LAB — TSH: TSH: 2.73 u[IU]/mL (ref 0.450–4.500)

## 2023-01-15 ENCOUNTER — Other Ambulatory Visit: Payer: Self-pay | Admitting: Family Medicine

## 2023-01-15 ENCOUNTER — Other Ambulatory Visit (HOSPITAL_BASED_OUTPATIENT_CLINIC_OR_DEPARTMENT_OTHER): Payer: Self-pay | Admitting: Cardiovascular Disease

## 2023-01-15 DIAGNOSIS — K219 Gastro-esophageal reflux disease without esophagitis: Secondary | ICD-10-CM

## 2023-01-15 NOTE — Telephone Encounter (Signed)
Patient of Dr. Cooper. Please review for refill. Thank you!  

## 2023-01-18 ENCOUNTER — Telehealth: Payer: Self-pay

## 2023-01-18 NOTE — Telephone Encounter (Signed)
Patient left a VM inquiring if she needed to have another mammogram or breast biopsy done.   Reviewed patient's chart and saw that she had a routine mammogram done on 11/28/22 at [facility]. Report did not show anything concerning and recommended repeat mammogram in 1 year.   Returned patient's call and left a VM relaying above information. Reminded patient of F/U appointment MedOnc provider in September of this year, and encouraged patient to call back should she have any other questions/concerns.

## 2023-01-22 DIAGNOSIS — H9042 Sensorineural hearing loss, unilateral, left ear, with unrestricted hearing on the contralateral side: Secondary | ICD-10-CM | POA: Diagnosis not present

## 2023-01-22 DIAGNOSIS — H9202 Otalgia, left ear: Secondary | ICD-10-CM | POA: Diagnosis not present

## 2023-02-05 ENCOUNTER — Other Ambulatory Visit: Payer: Self-pay | Admitting: Cardiovascular Disease

## 2023-02-18 ENCOUNTER — Other Ambulatory Visit (HOSPITAL_BASED_OUTPATIENT_CLINIC_OR_DEPARTMENT_OTHER): Payer: Self-pay | Admitting: Cardiovascular Disease

## 2023-02-18 MED ORDER — IRBESARTAN 300 MG PO TABS: 300.0000 mg | ORAL_TABLET | Freq: Every day | ORAL | 0 refills | Status: DC

## 2023-02-21 DIAGNOSIS — H04123 Dry eye syndrome of bilateral lacrimal glands: Secondary | ICD-10-CM | POA: Diagnosis not present

## 2023-03-01 ENCOUNTER — Other Ambulatory Visit (HOSPITAL_BASED_OUTPATIENT_CLINIC_OR_DEPARTMENT_OTHER): Payer: Self-pay | Admitting: Cardiovascular Disease

## 2023-03-01 ENCOUNTER — Other Ambulatory Visit: Payer: Self-pay | Admitting: Physician Assistant

## 2023-03-01 DIAGNOSIS — I10 Essential (primary) hypertension: Secondary | ICD-10-CM

## 2023-03-12 ENCOUNTER — Other Ambulatory Visit: Payer: Self-pay | Admitting: Cardiovascular Disease

## 2023-03-15 ENCOUNTER — Ambulatory Visit
Admission: EM | Admit: 2023-03-15 | Discharge: 2023-03-15 | Disposition: A | Discharge status: Home / Self Care | Payer: PPO | Attending: Internal Medicine | Admitting: Internal Medicine

## 2023-03-15 DIAGNOSIS — R200 Anesthesia of skin: Secondary | ICD-10-CM | POA: Diagnosis not present

## 2023-03-15 DIAGNOSIS — R220 Localized swelling, mass and lump, head: Secondary | ICD-10-CM

## 2023-03-15 MED ORDER — DEXAMETHASONE SODIUM PHOSPHATE 10 MG/ML IJ SOLN
10.0000 mg | Freq: Once | INTRAMUSCULAR | Status: AC
  Administered 2023-03-15: 10 mg via INTRAMUSCULAR

## 2023-03-15 NOTE — Discharge Instructions (Addendum)
I suspect that you may be having an allergic reaction so you were given a steroid shot today in urgent care.  Please go to the emergency department if symptoms persist or worsen over the next 24 hours.

## 2023-03-15 NOTE — ED Provider Notes (Signed)
[name]    CSN: [id number] Arrival date & time: 03/15/23  1009      History   Chief Complaint Chief Complaint  Patient presents with   Oral Swelling    HPI [name] is a 77 y.o. female.   Patient presents with right sided lower lip swelling and tongue numbness that started upon awakening approximately 3 days ago.  Reports the lip swelling has seemed to improve but tongue numbness have moved from the right side to the tip of her tongue.  Denies headache, dizziness, blurred vision, nausea, vomiting, chest pain, shortness of breath.  Denies any feelings of throat closing or shortness of breath.  Denies any changes to the environment or any associated fever that could have caused symptoms.  Has not taken any medications for symptoms.  Denies any recent falls or injuries to the area.     Past Medical History:  Diagnosis Date   Abnormal nuclear stress test 12/29/2014   Allergy    Anemia, unspecified    Arthritis    Blood transfusion without reported diagnosis    2018   CAD (coronary artery disease)    sees. Dr. Excell Seltzer. s/p cath with PCI of RCA (xience des) June 2010. Pt also with LAD and D2 dzs-NL FFR in both areas med Rx   Carotid artery occlusion    Cataract    sees Dr. Elmer Picker    Depressive disorder, not elsewhere classified    Duodenal nodule    Edema    Fibromyalgia    Gastric polyps    COLON   GERD (gastroesophageal reflux disease)    Glaucoma    narrow angle, sees Dr. Derek Cletis Muma)    Hearing loss    left ear   Heart murmur    questionable   Hemorrhoids    HSV (herpes simplex virus) anogenital infection 05/2014   HTN (hypertension)    Hyperlipidemia    Low back pain    she sees Dr. Otelia Sergeant, also Dr. Alvester Morin for pain medications and Zonia Kief PA for injections    Obesity    Osteopenia 01/2018   T score -1.6 FRAX 7.1% / 1.4%   Peripheral vascular disease (HCC)    Resistant hypertension 02/21/2007   Qualifier: Diagnosis  of  By: Briscoe Burns CMA, Alvy Beal     Stroke [facility])    TIA  "many yrs ago"     TIA (transient ischemic attack)    also Hx of it.    Type II or unspecified type diabetes mellitus without mention of complication, not stated as uncontrolled    sees Dr. Ardyth Harps     Patient Active Problem List   Diagnosis Date Noted   Environmental and seasonal allergies 09/27/2022   Asymptomatic varicose veins of both lower extremities 11/04/2020   Ductal carcinoma in situ (DCIS) of left breast 01/06/2020   Right-sided thoracic back pain 07/01/2019   Hyperlipidemia LDL goal <70 07/17/2017   History of lumbar spinal fusion    History of fusion of lumbar spine    Anemia due to blood loss 11/02/2016    Class: Acute   Lethargy 10/31/2016   AKI (acute kidney injury) (HCC) 10/31/2016   (HFpEF) heart failure with preserved ejection fraction (HCC) 10/31/2016   Cough    Leukocytosis    Spondylolisthesis, lumbar region 10/30/2016    Class: Chronic   Spinal stenosis of lumbar region 10/30/2016    Class: Chronic   Retained orthopedic hardware 10/30/2016  Class: Chronic   Spinal stenosis of lumbar region with neurogenic claudication 10/30/2016   Genetic testing 09/14/2015   Atypical ductal hyperplasia of left breast 08/24/2015   Family history of breast cancer    Family history of prostate cancer    Family history of stomach cancer    Abnormal nuclear stress test 12/29/2014   Cervical spondylosis with myelopathy and radiculopathy 06/11/2014   Carpal tunnel syndrome 05/14/2014   Aftercare following surgery of the circulatory system, NEC 03/11/2014   Pain of right lower extremity 03/11/2014   Atypical lobular hyperplasia of left breast 02/12/2014   Fibromyalgia 09/21/2013   Carotid artery disease (HCC) 02/29/2012   Back abscess 01/22/2012   Chest pain with moderate risk of acute coronary syndrome 12/09/2009   Shortness of breath 06/10/2009   Essential hypertension, malignant 03/21/2009   Orthostatic  hypotension 01/31/2009   CAD (coronary artery disease) 12/23/2008   Normocytic anemia 07/21/2008   Headache(784.0) 07/21/2008   History of TIA (transient ischemic attack) 07/21/2008   Edema 02/27/2008   Diabetes mellitus with complication (HCC) 07/16/2007   Depression 07/16/2007   Dyslipidemia 02/21/2007   Resistant hypertension 02/21/2007   Asthma 02/21/2007   GERD 02/21/2007   LOW BACK PAIN 02/21/2007    Past Surgical History:  Procedure Laterality Date   ABDOMINAL HYSTERECTOMY  age 55   TAH and USO  Leiomyomata   ANTERIOR CERVICAL CORPECTOMY N/A 06/11/2014   Procedure: ANTERIOR CERVICAL CORPECTOMY CERVICAL SIX; WITH CERVICAL FIVE TO CERVICAL SEVEN ARTHRODESIS;  Surgeon: Coletta Memos, MD;  Location: [facility];  Service: Neurosurgery;  Laterality: N/A;   BACK SURGERY  2008   lumb fusion   BREAST BIOPSY Left 01/26/2014   Procedure: EXCISION LEFT BREAST MASS;  Surgeon: Ernestene Mention, MD;  Location: [facility];  Service: General;  Laterality: Left;   BREAST LUMPECTOMY WITH RADIOACTIVE SEED LOCALIZATION Left 07/24/2017   Procedure: LEFT BREAST LUMPECTOMY WITH RADIOACTIVE SEED LOCALIZATION ERAS;  Surgeon: Harriette Bouillon, MD;  Location: [facility];  Service: General;  Laterality: Left;   BREAST LUMPECTOMY WITH RADIOACTIVE SEED LOCALIZATION Left 01/13/2020   Procedure: LEFT BREAST LUMPECTOMY WITH RADIOACTIVE SEED LOCALIZATION;  Surgeon: Harriette Bouillon, MD;  Location: [facility];  Service: General;  Laterality: Left;   CARDIAC CATHETERIZATION     CARDIAC CATHETERIZATION N/A 12/29/2014   Procedure: Left Heart Cath and Coronary Angiography;  Surgeon: Tonny Bollman, MD;  Location: [facility];  Service: Cardiovascular;  Laterality: N/A;   CAROTID ENDARTERECTOMY  march 2012   per Dr. Lynetta Mare   CARPAL TUNNEL RELEASE Left 05/14/2014   Procedure: Left Carpal tunnel release;  Surgeon: Coletta Memos, MD;  Location: [facility];   Service: Neurosurgery;  Laterality: Left;  Left Carpal tunnel release   CARPAL TUNNEL RELEASE Bilateral    CHOLECYSTECTOMY     COLONOSCOPY  06/26/2017   per Dr. Marina Goodell, adenomatous polyp, repeat in 5 yrs     CORONARY STENT PLACEMENT     Dr. Excell Seltzer   DILATION AND CURETTAGE OF UTERUS     ESOPHAGOGASTRODUODENOSCOPY  02-08-06   per Dr. Marina Goodell, gastritis    EYE SURGERY Left June 2016   Cataract   GLAUCOMA SURGERY     with laser, per Dr. Marina Goodell    HARDWARE REMOVAL  10/30/2016   Procedure: HARDWARE REMOVAL;  Surgeon: Kerrin Champagne, MD;  Location: [facility];  Service: Orthopedics;;   LUMBAR FUSION  2008   POLYPECTOMY     POSTERIOR  LUMBAR FUSION  10/30/2016   Removal of hardware rods and pedicle screws lumbar four, Extension of fusion L4-5 to L5-S1 with reinsertion of screws at L 4, left L5-S1 transforaminal lumbar interbody fusion, Exploration  left L5 nerve root, bilateral decompressive laminectomy L3-4/notes 10/30/2016   ULNAR NERVE TRANSPOSITION Left 05/14/2014   Procedure: Left Ulnar nerve decompression;  Surgeon: Coletta Memos, MD;  Location: [facility];  Service: Neurosurgery;  Laterality: Left;  Left Ulnar nerve decompression    OB History     Gravida  2   Para  1   Term  1   Preterm      AB  1   Living  1      SAB  1   IAB      Ectopic      Multiple      Live Births               Home Medications    Prior to Admission medications   Medication Sig Start Date End Date Taking? Authorizing Provider  ACCU-CHEK GUIDE test strip 1 each by Other route in the morning and at bedtime. 09/12/19   [provider]  aspirin EC 81 MG tablet Take 1 tablet (81 mg total) by mouth daily. Swallow whole. 09/20/20   Tereso Newcomer T, PA-C  chlorthalidone (HYGROTON) 25 MG tablet TAKE ONE TABLET BY MOUTH ONCE DAILY Please schedule an appt with Dr Duke Salvia for further refills. 11/09/22   Chilton Si, MD  ergocalciferol (VITAMIN D2) 50000 UNITS capsule Take 50,000 Units by mouth  every Saturday. On Saturday.    [provider]  FARXIGA 10 MG TABS tablet Take 10 mg by mouth every morning. 12/07/19   [provider]  gabapentin (NEURONTIN) 300 MG capsule Take 300 mg by mouth as needed.    [provider]  HUMALOG 100 UNIT/ML injection  12/20/17   [provider]  hydrALAZINE (APRESOLINE) 10 MG tablet Take 1 tablet (10 mg total) by mouth 3 (three) times daily. 03/01/23   Tereso Newcomer T, PA-C  hydrALAZINE (APRESOLINE) 100 MG tablet TAKE ONE TABLET BY MOUTH EVERY MORNING and TAKE ONE TABLET BY MOUTH AT NOON and TAKE ONE TABLET BY MOUTH EVERYDAY AT BEDTIME 12/19/22   Tonny Bollman, MD  HYDROcodone bit-homatropine (HYCODAN) 5-1.5 MG/5ML syrup Take 5 mLs by mouth every 4 (four) hours as needed. 09/27/22   Nelwyn Salisbury, MD  Insulin Disposable Pump (V-GO 40) KIT Inject 1 application into the skin See admin instructions. 03/18/20   [provider]  irbesartan (AVAPRO) 300 MG tablet Take 1 tablet (300 mg total) by mouth at bedtime. 02/18/23   Tonny Bollman, MD  isosorbide mononitrate (IMDUR) 60 MG 24 hr tablet Please call [phone] to schedule an appointment for future refills. Thank you. 1st attempt. 02/05/23   Tonny Bollman, MD  latanoprost (XALATAN) 0.005 % ophthalmic solution Place 1 drop into both eyes at bedtime.  09/26/11   [provider]  metoprolol succinate (TOPROL-XL) 100 MG 24 hr tablet Take 1 tablet (100 mg total) by mouth daily. 03/01/23   Tonny Bollman, MD  montelukast (SINGULAIR) 10 MG tablet Take 10 mg by mouth daily.    [provider]  nitroGLYCERIN (NITROSTAT) 0.4 MG SL tablet Place 1 tablet (0.4 mg total) under the tongue every 5 (five) minutes as needed for chest pain. 11/24/14   Kilroy, Luke K, PA-C  OZEMPIC, 1 MG/DOSE, 4 MG/3ML SOPN Inject 1 mg into the skin once a  week. 12/08/19   [provider]  pantoprazole (PROTONIX) 40 MG tablet TAKE ONE TABLET BY MOUTH EVERY MORNING Needs appointment  for further refills 01/15/23   Nelwyn Salisbury, MD  Probiotic Product (PROBIOTIC ADVANCED PO) Take 1 capsule by mouth daily.    [provider]  rosuvastatin (CRESTOR) 40 MG tablet TAKE ONE TABLET BY MOUTH ONCE DAILY 03/13/23   Tonny Bollman, MD  spironolactone (ALDACTONE) 50 MG tablet TAKE ONE TABLET BY MOUTH ONCE DAILY 02/05/23   Tonny Bollman, MD  TYRVAYA 0.03 MG/ACT SOLN Place 1 spray into both nostrils 2 (two) times daily. 07/20/22   [provider]    Family History Family History  Problem Relation Age of Onset   Hypertension Mother    Hypertension Sister    Hyperlipidemia Sister    Hypertension Brother    Cancer Brother        PROSTATE/LUNG   Diabetes Brother    Heart disease Brother        Before age 39 - Bypass   Varicose Veins Brother    Cancer Father        Prostate and pancreatic    Stomach cancer Maternal Grandmother 65   Heart disease Maternal Grandfather    Breast cancer Maternal Aunt 55   Cancer Maternal Aunt        Stomach   Breast cancer Cousin 2       maternal first cousin   Prostate cancer Cousin 92   Breast cancer Cousin 60   Esophageal cancer Cousin    Breast cancer Cousin 40       maternal first cousin's daughter   Coronary artery disease Neg Hx        premature CAD   Colon cancer Neg Hx    Colon polyps Neg Hx    Rectal cancer Neg Hx     Social History Social History   Tobacco Use   Smoking status: Former    Current packs/day: 0.00    Types: Cigarettes    Quit date: 01/22/1967    Years since quitting: 56.1   Smokeless tobacco: Never  Vaping Use   Vaping status: Never Used  Substance Use Topics   Alcohol use: No    Alcohol/week: 0.0 standard drinks of alcohol   Drug use: No     Allergies   Dulaglutide   Review of Systems Review of Systems Per HPI  Physical Exam Triage Vital Signs ED Triage Vitals  Encounter Vitals Group     BP 03/15/23 1022 (!) 100/51     Systolic BP Percentile --      Diastolic BP  Percentile --      Pulse Rate 03/15/23 1022 (!) 57     Resp 03/15/23 1022 18     Temp 03/15/23 1022 98.4 F (36.9 C)     Temp Source 03/15/23 1022 Oral     SpO2 03/15/23 1022 98 %     Weight 03/15/23 1021 170 lb (77.1 kg)     Height --      Head Circumference --      Peak Flow --      Pain Score 03/15/23 1020 0     Pain Loc --      Pain Education --      Exclude from Growth Chart --    No data found.  Updated Vital Signs BP (!) 100/51 (BP Location: Left Arm)   Pulse (!) 57   Temp 98.4 F (36.9 C) (Oral)  Resp 18   Wt 170 lb (77.1 kg)   SpO2 98%   BMI 30.11 kg/m   Visual Acuity Right Eye Distance:   Left Eye Distance:   Bilateral Distance:    Right Eye Near:   Left Eye Near:    Bilateral Near:     Physical Exam Constitutional:      General: She is not in acute distress.    Appearance: Normal appearance. She is not toxic-appearing or diaphoretic.  HENT:     Head: Normocephalic and atraumatic.     Mouth/Throat:     Comments: Has mild swelling present to the right portion of the lower lip.  No obvious lesions noted.  Tongue appears normal on exam and the patient has full movement of tongue.  Throat appears normal. Eyes:     Extraocular Movements: Extraocular movements intact.     Conjunctiva/sclera: Conjunctivae normal.     Pupils: Pupils are equal, round, and reactive to light.  Pulmonary:     Effort: Pulmonary effort is normal.  Neurological:     General: No focal deficit present.     Mental Status: She is alert and oriented to person, place, and time. Mental status is at baseline.     Cranial Nerves: Cranial nerves 2-12 are intact.     Sensory: Sensation is intact.     Motor: Motor function is intact.     Coordination: Coordination is intact.     Gait: Gait is intact.  Psychiatric:        Mood and Affect: Mood normal.        Behavior: Behavior normal.        Thought Content: Thought content normal.        Judgment: Judgment normal.      UC  Treatments / Results  Labs (all labs ordered are listed, but only abnormal results are displayed) Labs Reviewed - No data to display  EKG   Radiology No results found.  Procedures Procedures (including critical care time)  Medications Ordered in UC Medications  dexamethasone (DECADRON) injection 10 mg (10 mg Intramuscular Given 03/15/23 1041)    Initial Impression / Assessment and Plan / UC Course  I have reviewed the triage vital signs and the nursing notes.  Pertinent labs & imaging results that were available during my care of the patient were reviewed by me and considered in my medical decision making (see chart for details).     I have a very low concern for any anaphylaxis versus CVA given physical exam.  Neuro exam is normal and patient is completely alert and oriented so do not think that emergent evaluation is necessary.  My suspicion is that patient could be having some form of allergic reaction causing current symptoms given right lower lip swelling.  Will treat with IM Decadron to see if this will be helpful. EF is 55-65 so this should be safe. Patient does have glaucoma but I do think that benefits outweigh risks in relation to patient's current symptoms. Although, patient was advised to go straight to the emergency department if symptoms persist or worsen over the next 24 hours.  Patient advised to monitor blood sugar very closely as well given steroid therapy.  Patient verbalized understanding and was agreeable with plan. Final Clinical Impressions(s) / UC Diagnoses   Final diagnoses:  Lip swelling  Numbness of tongue     Discharge Instructions      I suspect that you may be having an allergic reaction so you  were given a steroid shot today in urgent care.  Please go to the emergency department if symptoms persist or worsen over the next 24 hours.    ED Prescriptions   None    PDMP not reviewed this encounter.   Gustavus Bryant, [place] 03/15/23 1114

## 2023-03-15 NOTE — ED Triage Notes (Signed)
Patient states that when she woke up Wednesday Morning her bottom lip was swollen and right side of her tongue was numb. Numbness on tongue has moved to the middle. Swelling has went down on the lips.

## 2023-03-20 DIAGNOSIS — I872 Venous insufficiency (chronic) (peripheral): Secondary | ICD-10-CM | POA: Diagnosis not present

## 2023-03-20 DIAGNOSIS — I87393 Chronic venous hypertension (idiopathic) with other complications of bilateral lower extremity: Secondary | ICD-10-CM | POA: Diagnosis not present

## 2023-03-20 DIAGNOSIS — M79662 Pain in left lower leg: Secondary | ICD-10-CM | POA: Diagnosis not present

## 2023-03-20 DIAGNOSIS — M79661 Pain in right lower leg: Secondary | ICD-10-CM | POA: Diagnosis not present

## 2023-03-26 NOTE — Progress Notes (Unsigned)
Patient Care Team: Nelwyn Salisbury, MD as PCP - Jerelene Redden, MD as PCP - Cardiology (Cardiology) Adrian Prince, MD as Consulting Physician (Endocrinology) Coletta Memos, MD as Consulting Physician (Neurosurgery) Frederico Hamman, MD as Consulting Physician (Orthopedic Surgery) Pershing Proud, RN as Oncology Nurse Navigator Donnelly Angelica, RN as Oncology Nurse Navigator Beatrice Lecher, PA-C as Physician Assistant (Cardiology) Verner Chol, [facility] (Inactive) as Pharmacist (Pharmacist)   CHIEF COMPLAINT: Follow up left breast DCIS  Oncology History Overview Note  Cancer Staging No matching staging information was found for the patient.    Atypical lobular hyperplasia of left breast  01/26/2014 Surgery   Left Lumpectomy   Diagnosis Breast, excision, Palpable left - LOBULAR NEOPLASIA (ATYPICAL LOBULAR HYPERPLASIA) SEE COMMENT - MICROCALCIFICATIONS IDENTIFIED. - SURGICAL MARGIN, NEGATIVE FOR ATYPIA OR MALIGNANCY. Microscopic Comment The surgical resection margin(s) of the specimen were inked and microscopically evaluated. Multiple representative sections demonstrate foci of atypical lobular hyperplasia. Additional non-neoplastic findings to include benign radial scar, fibrocystic change, usual ductal hyperplasia, columnar cell hyperplasia/change without atypia, benign adenosis, patchy mild periductal chronic inflammation, and solitary hyalinizing calcified fibroadenomatoid nodule. There are abundant microcalcifications present within benign lobules. (CRR:ecj 01/27/2014)   02/12/2014 Initial Diagnosis   Atypical lobular hyperplasia of left breast   11/2014 - 04/2015 Anti-estrogen oral therapy   She tried anastrozole in 11/2014 and Tamoxifen in 03/2015 for about one month each, and stopped due to side effects.    06/27/2017 Pathology Results   Diagnosis 1. Breast, left, needle core biopsy, posterior upper outer quadrant - FIBROCYSTIC CHANGES. - THERE IS NO  EVIDENCE OF MALIGNANCY. - SEE COMMENT. 2. Breast, left, needle core biopsy, anterior lower inner quadrant - FIBROCYSTIC CHANGES. - THERE IS NO EVIDENCE OF MALIGNANCY. - SEE COMMENT. Microscopic Comment 1. , 2. The results were called to [facility] on 06/28/2017. (JBK:ecj 06/29/1027)   07/24/2017 Surgery   Left Lumpectomy  Diagnosis Breast, lumpectomy, Left - FIBROCYSTIC CHANGES. - HEALING BIOPSY SITE. - THERE IS NO EVIDENCE OF MALIGNANCY. - SEE COMMENT. Microscopic Comment The surgical resection margin(s) of the specimen were inked and microscopically evaluated.   Ductal carcinoma in situ (DCIS) of left breast  12/16/2019 Initial Biopsy   Diagnosis 1. Breast, right, needle core biopsy, upper inner - FIBROCYSTIC CHANGES. - NO MALIGNANCY IDENTIFIED. 2. Breast, right, needle core biopsy, lateral - FIBROCYSTIC CHANGES WITH FOCAL ADENOSIS. - NO MALIGNANCY IDENTIFIED. 3. Breast, left, needle core biopsy, lateral - DUCTAL CARCINOMA IN SITU, INTERMEDIATE GRADE. - SEE MICROSCOPIC DESCRIPTION Microscopic Comment 3. Immunohistochemistry for p63, calponin and smooth muscle myosin and E-Cadherin supports the diagnosis. Estrogen and progesterone receptors will be performed. Dr. Kenard Gower has reviewed part 3 and agrees.   12/16/2019 Receptors her2   3. PROGNOSTIC INDICATORS Results: IMMUNOHISTOCHEMICAL AND MORPHOMETRIC ANALYSIS PERFORMED MANUALLY Estrogen Receptor: 95%, POSITIVE, STRONG-MODERATE STAINING INTENSITY Progesterone Receptor: >95%, POSITIVE, STRONG STAINING INTENSITY   01/06/2020 Initial Diagnosis   Ductal carcinoma in situ (DCIS) of left breast   01/13/2020 Surgery   LEFT BREAST LUMPECTOMY WITH RADIOACTIVE SEED LOCALIZATION by Dr Luisa Hart   01/13/2020 Pathology Results   FINAL MICROSCOPIC DIAGNOSIS:   A. BREAST, LEFT, LUMPECTOMY:  - Intermediate grade ductal carcinoma in situ with necrosis, spanning 2  cm.  - Resection margins are negative.  - Biopsy  site and cavity.  - See oncology table.    02/11/2020 - 03/04/2020 Radiation Therapy   Adjuvant Radiation with Dr Basilio Cairo     Anti-estrogen oral therapy  She declined as she did not tolerate before      CURRENT THERAPY: Surveillance   INTERVAL HISTORY [name] returns for follow up as scheduled. Last seen by Dr. Mosetta Putt 03/26/22. Mammogram 11/28/22 was benign. Denies concerns in her breasts such as new lump/mass, nipple discharge or inversion, or skin change.  She continues to deal with chronic back pain which limits her mobility.   ROS  All other systems reviewed and negative  Past Medical History:  Diagnosis Date   Abnormal nuclear stress test 12/29/2014   Allergy    Anemia, unspecified    Arthritis    Blood transfusion without reported diagnosis    2018   CAD (coronary artery disease)    sees. Dr. Excell Seltzer. s/p cath with PCI of RCA (xience des) June 2010. Pt also with LAD and D2 dzs-NL FFR in both areas med Rx   Carotid artery occlusion    Cataract    sees Dr. Elmer Picker    Depressive disorder, not elsewhere classified    Duodenal nodule    Edema    Fibromyalgia    Gastric polyps    COLON   GERD (gastroesophageal reflux disease)    Glaucoma    narrow angle, sees Dr. Derek Mound)    Hearing loss    left ear   Heart murmur    questionable   Hemorrhoids    HSV (herpes simplex virus) anogenital infection 05/2014   HTN (hypertension)    Hyperlipidemia    Low back pain    she sees Dr. Otelia Sergeant, also Dr. Alvester Morin for pain medications and Zonia Kief PA for injections    Obesity    Osteopenia 01/2018   T score -1.6 FRAX 7.1% / 1.4%   Peripheral vascular disease (HCC)    Resistant hypertension 02/21/2007   Qualifier: Diagnosis of  By: Briscoe Burns CMA, Alvy Beal     Stroke [facility])    TIA  "many yrs ago"     TIA (transient ischemic attack)    also Hx of it.    Type II or unspecified type diabetes mellitus without mention of complication, not stated as uncontrolled    sees  Dr. Ardyth Harps      Past Surgical History:  Procedure Laterality Date   ABDOMINAL HYSTERECTOMY  age 20   TAH and USO  Leiomyomata   ANTERIOR CERVICAL CORPECTOMY N/A 06/11/2014   Procedure: ANTERIOR CERVICAL CORPECTOMY CERVICAL SIX; WITH CERVICAL FIVE TO CERVICAL SEVEN ARTHRODESIS;  Surgeon: Coletta Memos, MD;  Location: [facility];  Service: Neurosurgery;  Laterality: N/A;   BACK SURGERY  2008   lumb fusion   BREAST BIOPSY Left 01/26/2014   Procedure: EXCISION LEFT BREAST MASS;  Surgeon: Ernestene Mention, MD;  Location: [facility];  Service: General;  Laterality: Left;   BREAST LUMPECTOMY WITH RADIOACTIVE SEED LOCALIZATION Left 07/24/2017   Procedure: LEFT BREAST LUMPECTOMY WITH RADIOACTIVE SEED LOCALIZATION ERAS;  Surgeon: Harriette Bouillon, MD;  Location: [facility];  Service: General;  Laterality: Left;   BREAST LUMPECTOMY WITH RADIOACTIVE SEED LOCALIZATION Left 01/13/2020   Procedure: LEFT BREAST LUMPECTOMY WITH RADIOACTIVE SEED LOCALIZATION;  Surgeon: Harriette Bouillon, MD;  Location: [facility];  Service: General;  Laterality: Left;   CARDIAC CATHETERIZATION     CARDIAC CATHETERIZATION N/A 12/29/2014   Procedure: Left Heart Cath and Coronary Angiography;  Surgeon: Tonny Bollman, MD;  Location: [facility] INVASIVE CV LAB;  Service: Cardiovascular;  Laterality: N/A;   CAROTID ENDARTERECTOMY  march  2012   per Dr. Lynetta Mare   CARPAL TUNNEL RELEASE Left 05/14/2014   Procedure: Left Carpal tunnel release;  Surgeon: Coletta Memos, MD;  Location: [facility];  Service: Neurosurgery;  Laterality: Left;  Left Carpal tunnel release   CARPAL TUNNEL RELEASE Bilateral    CHOLECYSTECTOMY     COLONOSCOPY  06/26/2017   per Dr. Marina Goodell, adenomatous polyp, repeat in 5 yrs     CORONARY STENT PLACEMENT     Dr. Excell Seltzer   DILATION AND CURETTAGE OF UTERUS     ESOPHAGOGASTRODUODENOSCOPY  02-08-06   per Dr. Marina Goodell, gastritis    EYE SURGERY Left June 2016   Cataract    GLAUCOMA SURGERY     with laser, per Dr. Marina Goodell    HARDWARE REMOVAL  10/30/2016   Procedure: HARDWARE REMOVAL;  Surgeon: Kerrin Champagne, MD;  Location: [facility] OR;  Service: Orthopedics;;   LUMBAR FUSION  2008   POLYPECTOMY     POSTERIOR LUMBAR FUSION  10/30/2016   Removal of hardware rods and pedicle screws lumbar four, Extension of fusion L4-5 to L5-S1 with reinsertion of screws at L 4, left L5-S1 transforaminal lumbar interbody fusion, Exploration  left L5 nerve root, bilateral decompressive laminectomy L3-4/notes 10/30/2016   ULNAR NERVE TRANSPOSITION Left 05/14/2014   Procedure: Left Ulnar nerve decompression;  Surgeon: Coletta Memos, MD;  Location: [facility];  Service: Neurosurgery;  Laterality: Left;  Left Ulnar nerve decompression     Outpatient Encounter Medications as of 03/27/2023  Medication Sig   ACCU-CHEK GUIDE test strip 1 each by Other route in the morning and at bedtime.   aspirin EC 81 MG tablet Take 1 tablet (81 mg total) by mouth daily. Swallow whole.   chlorthalidone (HYGROTON) 25 MG tablet TAKE ONE TABLET BY MOUTH ONCE DAILY Please schedule an appt with Dr Duke Salvia for further refills.   ergocalciferol (VITAMIN D2) 50000 UNITS capsule Take 50,000 Units by mouth every Saturday. On Saturday.   FARXIGA 10 MG TABS tablet Take 10 mg by mouth every morning.   gabapentin (NEURONTIN) 300 MG capsule Take 300 mg by mouth as needed.   HUMALOG 100 UNIT/ML injection    hydrALAZINE (APRESOLINE) 10 MG tablet Take 1 tablet (10 mg total) by mouth 3 (three) times daily.   hydrALAZINE (APRESOLINE) 100 MG tablet TAKE ONE TABLET BY MOUTH EVERY MORNING and TAKE ONE TABLET BY MOUTH AT NOON and TAKE ONE TABLET BY MOUTH EVERYDAY AT BEDTIME   HYDROcodone bit-homatropine (HYCODAN) 5-1.5 MG/5ML syrup Take 5 mLs by mouth every 4 (four) hours as needed.   Insulin Disposable Pump (V-GO 40) KIT Inject 1 application into the skin See admin instructions.   irbesartan (AVAPRO) 300 MG tablet Take 1 tablet (300 mg  total) by mouth at bedtime.   isosorbide mononitrate (IMDUR) 60 MG 24 hr tablet Please call [phone] to schedule an appointment for future refills. Thank you. 1st attempt.   latanoprost (XALATAN) 0.005 % ophthalmic solution Place 1 drop into both eyes at bedtime.    metoprolol succinate (TOPROL-XL) 100 MG 24 hr tablet Take 1 tablet (100 mg total) by mouth daily.   montelukast (SINGULAIR) 10 MG tablet Take 10 mg by mouth daily.   nitroGLYCERIN (NITROSTAT) 0.4 MG SL tablet Place 1 tablet (0.4 mg total) under the tongue every 5 (five) minutes as needed for chest pain.   OZEMPIC, 1 MG/DOSE, 4 MG/3ML SOPN Inject 1 mg into the skin once a week.   pantoprazole (PROTONIX) 40 MG tablet TAKE ONE  TABLET BY MOUTH EVERY MORNING Needs appointment for further refills   Probiotic Product (PROBIOTIC ADVANCED PO) Take 1 capsule by mouth daily.   rosuvastatin (CRESTOR) 40 MG tablet TAKE ONE TABLET BY MOUTH ONCE DAILY   spironolactone (ALDACTONE) 50 MG tablet TAKE ONE TABLET BY MOUTH ONCE DAILY   TYRVAYA 0.03 MG/ACT SOLN Place 1 spray into both nostrils 2 (two) times daily.   No facility-administered encounter medications on file as of 03/27/2023.     Today's Vitals   03/27/23 1026 03/27/23 1032 03/27/23 1038  BP: (!) 157/61 (!) 156/57 132/62  Pulse: (!) 58 (!) 57   Resp: 16    Temp: 100.2 F (37.9 C)    TempSrc: Oral    SpO2: 100%    Weight: 179 lb (81.2 kg)    Height: 5\' 3"  (1.6 m)    PainSc:   5    Body mass index is 31.71 kg/m.   PHYSICAL EXAM GENERAL:alert, no distress and comfortable SKIN: no rash  EYES: sclera clear NECK: without mass LYMPH:  no palpable cervical or supraclavicular lymphadenopathy  LUNGS: normal breathing effort HEART: no lower extremity edema ABDOMEN: abdomen soft, non-tender and normal bowel sounds NEURO: alert & oriented x 3 with fluent speech Breast exam: No nipple discharge or inversion.  S/p left lumpectomy and radiation, incisions completely healed.  Breast is  firm with mild lymphedema.  No palpable mass or nodularity in either breast or axilla that I could appreciate.   CBC    Component Value Date/Time   WBC 7.1 01/07/2023 1627   WBC 8.8 03/23/2021 0945   RBC 3.83 01/07/2023 1627   RBC 4.13 03/23/2021 0945   HGB 10.8 (L) 01/07/2023 1627   HGB 12.1 02/21/2016 0922   HCT 34.4 01/07/2023 1627   HCT 37.7 02/21/2016 0922   PLT 203 01/07/2023 1627   MCV 90 01/07/2023 1627   MCV 88.3 02/21/2016 0922   MCH 28.2 01/07/2023 1627   MCH 28.3 03/23/2021 0945   MCHC 31.4 (L) 01/07/2023 1627   MCHC 31.5 03/23/2021 0945   RDW 14.8 01/07/2023 1627   RDW 15.5 (H) 02/21/2016 0922   LYMPHSABS 1.6 01/07/2023 1627   LYMPHSABS 2.1 02/21/2016 0922   MONOABS 0.9 03/23/2021 0945   MONOABS 0.7 02/21/2016 0922   EOSABS 0.1 01/07/2023 1627   BASOSABS 0.0 01/07/2023 1627   BASOSABS 0.0 02/21/2016 0922     CMP     Component Value Date/Time   NA 144 01/07/2023 1627   NA 144 02/21/2016 0922   K 4.0 01/07/2023 1627   K 3.3 (L) 02/21/2016 0922   CL 105 01/07/2023 1627   CO2 22 01/07/2023 1627   CO2 28 02/21/2016 0922   GLUCOSE 89 01/07/2023 1627   GLUCOSE 44 (LL) 03/23/2021 0945   GLUCOSE 112 02/21/2016 0922   BUN 34 (H) 01/07/2023 1627   BUN 12.0 02/21/2016 0922   CREATININE 1.72 (H) 01/07/2023 1627   CREATININE 0.92 03/09/2020 1043   CREATININE 0.9 02/21/2016 0922   CALCIUM 10.1 01/07/2023 1627   CALCIUM 9.7 02/21/2016 0922   PROT 6.8 03/23/2021 1141   PROT 6.6 03/23/2021 0945   PROT 6.8 02/21/2016 0922   ALBUMIN 4.5 03/23/2021 1141   ALBUMIN 3.7 03/23/2021 0945   ALBUMIN 3.7 02/21/2016 0922   AST 23 03/23/2021 1141   AST 18 02/21/2016 0922   ALT 18 03/23/2021 1141   ALT 19 02/21/2016 0922   ALKPHOS 79 03/23/2021 1141   ALKPHOS 72 02/21/2016 4696  BILITOT 0.6 03/23/2021 1141   BILITOT 0.7 03/23/2021 0945   BILITOT 0.56 02/21/2016 0922   GFRNONAA >60 03/23/2021 0945   GFRAA 59 (L) 03/23/2020 1002     ASSESSMENT & PLAN:[name] is a 77 y.o. female with      1. Left Breast DCIS, Intermediate Grade, ER+/PR+ -She was discovered on screening MRI, diagnosed in 12/2019. S/p Left lumpectomy with Dr Luisa Hart on 01/13/20 and Adjuvant Radiation with Dr Basilio Cairo 02/11/20-03/04/20.  -Given her ER/PR positive disease she would benefit from antiestrogen therapy to reduce her risk of future breast cancer. She understands her risk, she previously tried anastrozole and tamoxifen for [facility],  but tolerated poorly and therefore declined trying again  -Due to her h/o [facility] and DCIS, she is at higher risk of developing future breast cancer.  She is agreeable with annual mammograms and MRIs 6 months apart -last mammo 5/20204 was benign -[name] is doing well.  Exam is benign.  No clinical concern for recurrence -Continue surveillance.  She is agreeable to screening MRI in November, I will call with results.  Next mammogram 11/2023 -Follow-up with me in 1 year, or sooner if needed   2. H/o Left breast ALH, Genetics Negative  -S/p left breast lumpectomy as of 01/26/14, which showed ALH.  -She tried chemo prevention with anastrozole and tamoxifen but could not tolerate due to side effects. She declined for treatment with Raloxifene  -She has strong family history of breast cancer, genetic testing was negative.    3. HTN, DM, CAD, CKD, history of TIA -She will continue follow-up with her primary care physician and continues medications.  -She had mild anemia and renal dysfunction on 01/2023 labs, she will see PCP this month   4. Chronic Lower back pain, Right hip pain  -S/p past, most recent 2 or 3 years ago with not much improvement  -Ambulates with cane -Followed by Dr. Maryln Manuel at [facility] neurosurgery    PLAN: -Last mammogram reviewed -Continue breast cancer surveillance -Screening MRI 05/2023 then next mammo 11/2023, I will call with results -Follow-up with me in 1 year, or sooner if needed  Orders Placed This Encounter  Procedures    MR BREAST BILATERAL W WO CONTRAST INC CAD    Standing Status:   Future    Standing Expiration Date:   03/26/2024    Order Specific Question:   If indicated for the ordered procedure, I authorize the administration of contrast media per Radiology protocol    Answer:   Yes    Order Specific Question:   What is the patient's sedation requirement?    Answer:   No Sedation    Order Specific Question:   Does the patient have a pacemaker or implanted devices?    Answer:   No    Order Specific Question:   Preferred imaging location?    Answer:   [facility] (table limit-550lbs)      All questions were answered. The patient knows to call the clinic with any problems, questions or concerns. No barriers to learning were detected.   Santiago Glad, NP-C 03/27/2023

## 2023-03-27 ENCOUNTER — Inpatient Hospital Stay: Payer: PPO | Attending: Nurse Practitioner | Admitting: Nurse Practitioner

## 2023-03-27 ENCOUNTER — Encounter: Payer: Self-pay | Admitting: Nurse Practitioner

## 2023-03-27 VITALS — BP 132/62 | HR 57 | Temp 100.2°F | Resp 16 | Ht 63.0 in | Wt 179.0 lb

## 2023-03-27 DIAGNOSIS — D0512 Intraductal carcinoma in situ of left breast: Secondary | ICD-10-CM | POA: Diagnosis not present

## 2023-03-27 DIAGNOSIS — Z86000 Personal history of in-situ neoplasm of breast: Secondary | ICD-10-CM | POA: Diagnosis not present

## 2023-03-27 DIAGNOSIS — Z803 Family history of malignant neoplasm of breast: Secondary | ICD-10-CM | POA: Insufficient documentation

## 2023-03-27 DIAGNOSIS — Z923 Personal history of irradiation: Secondary | ICD-10-CM | POA: Insufficient documentation

## 2023-03-28 ENCOUNTER — Encounter: Payer: Self-pay | Admitting: Cardiovascular Disease

## 2023-03-28 ENCOUNTER — Ambulatory Visit: Payer: PPO | Attending: Cardiovascular Disease | Admitting: Cardiovascular Disease

## 2023-03-28 VITALS — BP 138/54 | HR 55 | Ht 63.0 in | Wt 178.0 lb

## 2023-03-28 DIAGNOSIS — I10 Essential (primary) hypertension: Secondary | ICD-10-CM | POA: Diagnosis not present

## 2023-03-28 DIAGNOSIS — E785 Hyperlipidemia, unspecified: Secondary | ICD-10-CM

## 2023-03-28 DIAGNOSIS — I2510 Atherosclerotic heart disease of native coronary artery without angina pectoris: Secondary | ICD-10-CM

## 2023-03-28 DIAGNOSIS — I5032 Chronic diastolic (congestive) heart failure: Secondary | ICD-10-CM | POA: Diagnosis not present

## 2023-03-28 NOTE — Patient Instructions (Signed)
Medication Instructions:  Your physician recommends that you continue on your current medications as directed. Please refer to the Current Medication list given to you today.  *If you need a refill on your cardiac medications before your next appointment, please call your pharmacy*   Lab Work: CMET, CBC- Today   If you have labs (blood work) drawn today and your tests are completely normal, you will receive your results only by: MyChart Message (if you have MyChart) OR A paper copy in the mail If you have any lab test that is abnormal or we need to change your treatment, we will call you to review the results.   Testing/Procedures: None ordered    Follow-Up: At [facility], you and your health needs are our priority.  As part of our continuing mission to provide you with exceptional heart care, we have created designated Provider Care Teams.  These Care Teams include your primary Cardiologist (physician) and Advanced Practice Providers (APPs -  Physician Assistants and Nurse Practitioners) who all work together to provide you with the care you need, when you need it.  We recommend signing up for the patient portal called "MyChart".  Sign up information is provided on this After Visit Summary.  MyChart is used to connect with patients for Virtual Visits (Telemedicine).  Patients are able to view lab/test results, encounter notes, upcoming appointments, etc.  Non-urgent messages can be sent to your provider as well.   To learn more about what you can do with MyChart, go to [email or web address].    Your next appointment:   12 month(s)  Provider:   Tonny Bollman, MD     Other Instructions

## 2023-03-28 NOTE — Progress Notes (Signed)
Cardiology Office Note:    Date:  03/28/2023   ID:  Erskine Emery, DOB February 28, 1946, MRN [id number]  PCP:  Nelwyn Salisbury, MD   [facility] Providers Cardiologist:  Tonny Bollman, MD Cardiology APP:  Kennon Rounds     Referring MD: Nelwyn Salisbury, MD   Chief Complaint  Patient presents with   Coronary Artery Disease    History of Present Illness:    [name] is a 77 y.o. female with a hx of:  Coronary artery disease  [place] in 2010 >> PCI: DES to RCA Cath 12/2014: mod non-obs dz in LAD, RCA stent patent (HFpEF) heart failure with preserved ejection fraction  Echo 6/16: EF 60-65, Gr 2 DD Diabetes mellitus 2 Hypertension  Intol of Carvedilol (swelling) Hyperlipidemia  S/p Prior TIA MRI 10/21: "remote lacunar infarcts in R caudate and b/l corona radiata" Carotid artery dz S/p R CEA GERD Lumbar spine disease S/p prior fusion Breast CA (DCIS) S/p L lumpectomy  The patient is here alone today.  She is in a wheelchair.  States that she has difficulty walking any significant distance and walk from the parking lot to the office was too much.  She is primarily limited with 3 prior low back surgeries and resultant leg weakness.  She denies chest pain, chest pressure, shortness of breath, edema, orthopnea, or PND.  No heart palpitations.  She has had a lot of problems with resistant hypertension, but over the last few years she has seen Dr. Duke Salvia multiple times in her blood pressure is now well-controlled on multidrug therapy.  Past Medical History:  Diagnosis Date   Abnormal nuclear stress test 12/29/2014   Allergy    Anemia, unspecified    Arthritis    Blood transfusion without reported diagnosis    2018   CAD (coronary artery disease)    sees. Dr. Excell Seltzer. s/p cath with PCI of RCA (xience des) June 2010. Pt also with LAD and D2 dzs-NL FFR in both areas med Rx   Carotid artery occlusion    Cataract    sees Dr. Elmer Picker    Depressive  disorder, not elsewhere classified    Duodenal nodule    Edema    Fibromyalgia    Gastric polyps    COLON   GERD (gastroesophageal reflux disease)    Glaucoma    narrow angle, sees Dr. Derek Mound)    Hearing loss    left ear   Heart murmur    questionable   Hemorrhoids    HSV (herpes simplex virus) anogenital infection 05/2014   HTN (hypertension)    Hyperlipidemia    Low back pain    she sees Dr. Otelia Sergeant, also Dr. Alvester Morin for pain medications and Zonia Kief PA for injections    Obesity    Osteopenia 01/2018   T score -1.6 FRAX 7.1% / 1.4%   Peripheral vascular disease (HCC)    Resistant hypertension 02/21/2007   Qualifier: Diagnosis of  By: Briscoe Burns CMA, Alvy Beal     Stroke [facility])    TIA  "many yrs ago"     TIA (transient ischemic attack)    also Hx of it.    Type II or unspecified type diabetes mellitus without mention of complication, not stated as uncontrolled    sees Dr. Ardyth Harps     Past Surgical History:  Procedure Laterality Date   ABDOMINAL HYSTERECTOMY  age 68   TAH and USO  Leiomyomata  ANTERIOR CERVICAL CORPECTOMY N/A 06/11/2014   Procedure: ANTERIOR CERVICAL CORPECTOMY CERVICAL SIX; WITH CERVICAL FIVE TO CERVICAL SEVEN ARTHRODESIS;  Surgeon: Coletta Memos, MD;  Location: [facility];  Service: Neurosurgery;  Laterality: N/A;   BACK SURGERY  2008   lumb fusion   BREAST BIOPSY Left 01/26/2014   Procedure: EXCISION LEFT BREAST MASS;  Surgeon: Ernestene Mention, MD;  Location: [facility];  Service: General;  Laterality: Left;   BREAST LUMPECTOMY WITH RADIOACTIVE SEED LOCALIZATION Left 07/24/2017   Procedure: LEFT BREAST LUMPECTOMY WITH RADIOACTIVE SEED LOCALIZATION ERAS;  Surgeon: Harriette Bouillon, MD;  Location: [facility];  Service: General;  Laterality: Left;   BREAST LUMPECTOMY WITH RADIOACTIVE SEED LOCALIZATION Left 01/13/2020   Procedure: LEFT BREAST LUMPECTOMY WITH RADIOACTIVE SEED LOCALIZATION;  Surgeon: Harriette Bouillon, MD;  Location: [facility];  Service: General;  Laterality: Left;   CARDIAC CATHETERIZATION     CARDIAC CATHETERIZATION N/A 12/29/2014   Procedure: Left Heart Cath and Coronary Angiography;  Surgeon: Tonny Bollman, MD;  Location: [facility];  Service: Cardiovascular;  Laterality: N/A;   CAROTID ENDARTERECTOMY  march 2012   per Dr. Lynetta Mare   CARPAL TUNNEL RELEASE Left 05/14/2014   Procedure: Left Carpal tunnel release;  Surgeon: Coletta Memos, MD;  Location: [facility];  Service: Neurosurgery;  Laterality: Left;  Left Carpal tunnel release   CARPAL TUNNEL RELEASE Bilateral    CHOLECYSTECTOMY     COLONOSCOPY  06/26/2017   per Dr. Marina Goodell, adenomatous polyp, repeat in 5 yrs     CORONARY STENT PLACEMENT     Dr. Excell Seltzer   DILATION AND CURETTAGE OF UTERUS     ESOPHAGOGASTRODUODENOSCOPY  02-08-06   per Dr. Marina Goodell, gastritis    EYE SURGERY Left June 2016   Cataract   GLAUCOMA SURGERY     with laser, per Dr. Marina Goodell    HARDWARE REMOVAL  10/30/2016   Procedure: HARDWARE REMOVAL;  Surgeon: Kerrin Champagne, MD;  [facility]: [facility];  Service: Orthopedics;;   LUMBAR FUSION  2008   POLYPECTOMY     POSTERIOR LUMBAR FUSION  10/30/2016   Removal of hardware rods and pedicle screws lumbar four, Extension of fusion L4-5 to L5-S1 with reinsertion of screws at L 4, left L5-S1 transforaminal lumbar interbody fusion, Exploration  left L5 nerve root, bilateral decompressive laminectomy L3-4/notes 10/30/2016   ULNAR NERVE TRANSPOSITION Left 05/14/2014   Procedure: Left Ulnar nerve decompression;  Surgeon: Coletta Memos, MD;  Location: [facility];  Service: Neurosurgery;  Laterality: Left;  Left Ulnar nerve decompression    Current Medications: Current Meds  Medication Sig   ACCU-CHEK GUIDE test strip 1 each by Other route in the morning and at bedtime.   aspirin EC 81 MG tablet Take 1 tablet (81 mg total) by mouth daily. Swallow whole.   chlorthalidone (HYGROTON) 25 MG tablet TAKE  ONE TABLET BY MOUTH ONCE DAILY Please schedule an appt with Dr Duke Salvia for further refills.   ergocalciferol (VITAMIN D2) 50000 UNITS capsule Take 50,000 Units by mouth every Saturday. On Saturday.   FARXIGA 10 MG TABS tablet Take 10 mg by mouth every morning.   gabapentin (NEURONTIN) 300 MG capsule Take 300 mg by mouth as needed.   HUMALOG 100 UNIT/ML injection    hydrALAZINE (APRESOLINE) 10 MG tablet Take 1 tablet (10 mg total) by mouth 3 (three) times daily.   hydrALAZINE (APRESOLINE) 100 MG tablet TAKE ONE TABLET BY MOUTH EVERY MORNING and TAKE  ONE TABLET BY MOUTH AT NOON and TAKE ONE TABLET BY MOUTH EVERYDAY AT BEDTIME   HYDROcodone bit-homatropine (HYCODAN) 5-1.5 MG/5ML syrup Take 5 mLs by mouth every 4 (four) hours as needed.   Insulin Disposable Pump (V-GO 40) KIT Inject 1 application into the skin See admin instructions.   irbesartan (AVAPRO) 300 MG tablet Take 1 tablet (300 mg total) by mouth at bedtime.   isosorbide mononitrate (IMDUR) 60 MG 24 hr tablet Please call [phone] to schedule an appointment for future refills. Thank you. 1st attempt.   latanoprost (XALATAN) 0.005 % ophthalmic solution Place 1 drop into both eyes at bedtime.    metoprolol succinate (TOPROL-XL) 100 MG 24 hr tablet Take 1 tablet (100 mg total) by mouth daily.   montelukast (SINGULAIR) 10 MG tablet Take 10 mg by mouth daily.   nitroGLYCERIN (NITROSTAT) 0.4 MG SL tablet Place 1 tablet (0.4 mg total) under the tongue every 5 (five) minutes as needed for chest pain.   OZEMPIC, 1 MG/DOSE, 4 MG/3ML SOPN Inject 1 mg into the skin once a week.   pantoprazole (PROTONIX) 40 MG tablet TAKE ONE TABLET BY MOUTH EVERY MORNING Needs appointment for further refills   Probiotic Product (PROBIOTIC ADVANCED PO) Take 1 capsule by mouth daily.   rosuvastatin (CRESTOR) 40 MG tablet TAKE ONE TABLET BY MOUTH ONCE DAILY   spironolactone (ALDACTONE) 50 MG tablet TAKE ONE TABLET BY MOUTH ONCE DAILY   TYRVAYA 0.03 MG/ACT SOLN Place 1  spray into both nostrils 2 (two) times daily.     Allergies:   Dulaglutide   Social History   Socioeconomic History   Marital status: Married    Spouse name: Not on file   Number of children: 1   Years of education: 16   Highest education level: Bachelor's degree (e.g., BA, AB, BS)  Occupational History   Not on file  Tobacco Use   Smoking status: Former    Current packs/day: 0.00    Types: Cigarettes    Quit date: 01/22/1967    Years since quitting: 56.2   Smokeless tobacco: Never  Vaping Use   Vaping status: Never Used  Substance and Sexual Activity   Alcohol use: No    Alcohol/week: 0.0 standard drinks of alcohol   Drug use: No   Sexual activity: Not Currently    Birth control/protection: Surgical, Post-menopausal    Comment: HYST-1st intercourse 19 yo-5 partners  Other Topics Concern   Not on file  Social History Narrative   Not on file   Social Determinants of Health   Financial Resource Strain: Low Risk  (05/07/2022)   Received from [facility], [facility]   Overall Financial Resource Strain (CARDIA)    Difficulty of Paying Living Expenses: Not hard at all  Food Insecurity: No Food Insecurity (05/07/2022)   Received from [facility], [facility]   Hunger Vital Sign    Worried About Running Out of Food in the Last Year: Never true    Ran Out of Food in the Last Year: Never true  Transportation Needs: No Transportation Needs (05/07/2022)   Received from [facility], [facility], [facility]   [facility] - Transportation    In the past 12 months, has lack of transportation kept you from medical appointments or from getting medications?: No    Lack of Transportation (Non-Medical): No  Physical Activity: Insufficiently Active (05/07/2019)   Exercise Vital Sign    Days of Exercise  per Week: 1 day    Minutes of Exercise per Session: 10 min  Stress: No Stress Concern  Present (05/07/2019)   [employer] [facility] [employer] - Occupational Stress Questionnaire    Feeling of Stress : Not at all  Social Connections: Not on file     Family History: [name] family history includes Breast cancer (age of onset: 4) in her cousin; Breast cancer (age of onset: 41) in her maternal aunt; Breast cancer (age of onset: 48) in her cousin; Breast cancer (age of onset: 46) in her cousin; Cancer in her brother, father, and maternal aunt; Diabetes in her brother; Esophageal cancer in her cousin; Heart disease in her brother and maternal grandfather; Hyperlipidemia in her sister; Hypertension in her brother, mother, and sister; Prostate cancer (age of onset: 5) in her cousin; Stomach cancer (age of onset: 70) in her maternal grandmother; Varicose Veins in her brother. There is no history of Coronary artery disease, Colon cancer, Colon polyps, or Rectal cancer.  ROS:   Please see the history of present illness.    All other systems reviewed and are negative.  EKGs/Labs/Other Studies Reviewed:    The following studies were reviewed today: EKG Interpretation Date/Time:  Thursday March 28 2023 11:40:36 EDT Ventricular Rate:  54 PR Interval:  170 QRS Duration:  78 QT Interval:  442 QTC Calculation: 419 R Axis:   21  Text Interpretation: Sinus bradycardia Low voltage QRS When compared with ECG of 29-Dec-2014 06:55, No significant change was found Confirmed by Tonny Bollman [phone]) on 03/28/2023 11:47:04 AM    Recent Labs: 01/07/2023: BUN 34; Creatinine, Ser 1.72; Hemoglobin 10.8; Platelets 203; Potassium 4.0; Sodium 144; TSH 2.730  Recent Lipid Panel    Component Value Date/Time   CHOL 113 03/23/2021 1141   TRIG 63 03/23/2021 1141   HDL 49 03/23/2021 1141   CHOLHDL 2.3 03/23/2021 1141   CHOLHDL 2.7 03/09/2020 1043   VLDL 33.6 02/20/2019 1022   LDLCALC 50 03/23/2021 1141   LDLCALC 50 03/09/2020 1043     Risk Assessment/Calculations:                 Physical Exam:    VS:  BP (!) 138/54   Pulse (!) 55   Ht 5\' 3"  (1.6 m)   Wt 178 lb (80.7 kg)   SpO2 96%   BMI 31.53 kg/m     Wt Readings from Last 3 Encounters:  03/28/23 178 lb (80.7 kg)  03/27/23 179 lb (81.2 kg)  03/15/23 170 lb (77.1 kg)     GEN:  pleasant woman, in wheelchair, in no acute distress HEENT: Normal NECK: No JVD; No carotid bruits LYMPHATICS: No lymphadenopathy CARDIAC: RRR, 2/6 systolic murmur at the RUSB RESPIRATORY:  Clear to auscultation without rales, wheezing or rhonchi  ABDOMEN: Soft, non-tender, non-distended MUSCULOSKELETAL:  No edema; No deformity  SKIN: Warm and dry NEUROLOGIC:  Alert and oriented x 3 PSYCHIATRIC:  Normal affect   ASSESSMENT:    1. Essential hypertension   2. Coronary artery disease involving native coronary artery of native heart without angina pectoris   3. Chronic heart failure with preserved ejection fraction (HCC)   4. Hyperlipidemia LDL goal <70    PLAN:    In order of problems listed above:  Pt with resistant HTN, now very well-controlled on multidrug Rx (chlorthalidone, imdur, toprol XL, aldactone, hydralazine, irbesartan). Repeat metabolic panel today as last creatinine was significantly elevated from baseline (1.7). Stable, no angina. Continue ASA, anti-anginal Rx  as above, rosuvastatin. Appears euvolemic, BP well-controlled. Check labs today. Continue current management.  Treated with rosuvastatin, chol 114, LDL 64. Continue current management.   Overall the patient appears clinically stable. She is primarily limited by functional limitation with 3 prior back surgeries and resultant leg weakness.  As above, will check a metabolic panel today as her creatinine jumped from 0.9 in 2022 up to 1.72 on her most recent labs January 07, 2023.  As long as her creatinine is stable I would anticipate continuing her current medical program.     Medication Adjustments/Labs and Tests Ordered: Current medicines are  reviewed at length with the patient today.  Concerns regarding medicines are outlined above.  Orders Placed This Encounter  Procedures   CBC   Comprehensive metabolic panel   EKG 12-Lead   No orders of the defined types were placed in this encounter.   Patient Instructions  Medication Instructions:  Your physician recommends that you continue on your current medications as directed. Please refer to the Current Medication list given to you today.  *If you need a refill on your cardiac medications before your next appointment, please call your pharmacy*   Lab Work: CMET, CBC- Today   If you have labs (blood work) drawn today and your tests are completely normal, you will receive your results only by: MyChart Message (if you have MyChart) OR A paper copy in the mail If you have any lab test that is abnormal or we need to change your treatment, we will call you to review the results.   Testing/Procedures: None ordered    Follow-Up: At [facility], you and your health needs are our priority.  As part of our continuing mission to provide you with exceptional heart care, we have created designated Provider Care Teams.  These Care Teams include your primary Cardiologist (physician) and Advanced Practice Providers (APPs -  Physician Assistants and Nurse Practitioners) who all work together to provide you with the care you need, when you need it.  We recommend signing up for the patient portal called "MyChart".  Sign up information is provided on this After Visit Summary.  MyChart is used to connect with patients for Virtual Visits (Telemedicine).  Patients are able to view lab/test results, encounter notes, upcoming appointments, etc.  Non-urgent messages can be sent to your provider as well.   To learn more about what you can do with MyChart, go to [email or web address].    Your next appointment:   12 month(s)  Provider:   Tonny Bollman, MD     Other Instructions      Signed, Tonny Bollman, MD  03/28/2023 12:54 PM    [facility]

## 2023-03-29 LAB — CBC
Hematocrit: 32.9 % — ABNORMAL LOW (ref 34.0–46.6)
Hemoglobin: 10.1 g/dL — ABNORMAL LOW (ref 11.1–15.9)
MCH: 28.6 pg (ref 26.6–33.0)
MCHC: 30.7 g/dL — ABNORMAL LOW (ref 31.5–35.7)
MCV: 93 fL (ref 79–97)
Platelets: 172 10*3/uL (ref 150–450)
RBC: 3.53 x10E6/uL — ABNORMAL LOW (ref 3.77–5.28)
RDW: 13.6 % (ref 11.7–15.4)
WBC: 8.1 10*3/uL (ref 3.4–10.8)

## 2023-03-29 LAB — COMPREHENSIVE METABOLIC PANEL
ALT: 14 IU/L (ref 0–32)
AST: 21 IU/L (ref 0–40)
Albumin: 4 g/dL (ref 3.8–4.8)
Alkaline Phosphatase: 58 IU/L (ref 44–121)
BUN/Creatinine Ratio: 22 (ref 12–28)
BUN: 29 mg/dL — ABNORMAL HIGH (ref 8–27)
Bilirubin Total: <0.2 mg/dL (ref 0.0–1.2)
CO2: 23 mmol/L (ref 20–29)
Calcium: 9.7 mg/dL (ref 8.7–10.3)
Chloride: 108 mmol/L — ABNORMAL HIGH (ref 96–106)
Creatinine, Ser: 1.32 mg/dL — ABNORMAL HIGH (ref 0.57–1.00)
Globulin, Total: 2.3 g/dL (ref 1.5–4.5)
Glucose: 49 mg/dL — ABNORMAL LOW (ref 70–99)
Potassium: 4.4 mmol/L (ref 3.5–5.2)
Sodium: 143 mmol/L (ref 134–144)
Total Protein: 6.3 g/dL (ref 6.0–8.5)
eGFR: 42 mL/min/{1.73_m2} — ABNORMAL LOW (ref >59)

## 2023-04-30 ENCOUNTER — Telehealth: Payer: Self-pay

## 2023-04-30 NOTE — Progress Notes (Signed)
04/30/2023  Patient ID: [name], female   DOB: 10-21-1945, 77 y.o.   MRN: [id number]  Attempted to contact patient to schedule appointment for medication management. Left HIPAA compliant message for patient to return my call at their convenience.   Sherrill Raring, PharmD Clinical Pharmacist [phone]

## 2023-05-01 DIAGNOSIS — E785 Hyperlipidemia, unspecified: Secondary | ICD-10-CM | POA: Diagnosis not present

## 2023-05-01 DIAGNOSIS — E1159 Type 2 diabetes mellitus with other circulatory complications: Secondary | ICD-10-CM | POA: Diagnosis not present

## 2023-05-01 DIAGNOSIS — E559 Vitamin D deficiency, unspecified: Secondary | ICD-10-CM | POA: Diagnosis not present

## 2023-05-01 DIAGNOSIS — E669 Obesity, unspecified: Secondary | ICD-10-CM | POA: Diagnosis not present

## 2023-05-01 DIAGNOSIS — Z794 Long term (current) use of insulin: Secondary | ICD-10-CM | POA: Diagnosis not present

## 2023-05-01 DIAGNOSIS — D649 Anemia, unspecified: Secondary | ICD-10-CM | POA: Diagnosis not present

## 2023-05-01 DIAGNOSIS — I10 Essential (primary) hypertension: Secondary | ICD-10-CM | POA: Diagnosis not present

## 2023-05-01 DIAGNOSIS — F331 Major depressive disorder, recurrent, moderate: Secondary | ICD-10-CM | POA: Diagnosis not present

## 2023-05-01 DIAGNOSIS — I129 Hypertensive chronic kidney disease with stage 1 through stage 4 chronic kidney disease, or unspecified chronic kidney disease: Secondary | ICD-10-CM | POA: Diagnosis not present

## 2023-05-01 DIAGNOSIS — N1831 Chronic kidney disease, stage 3a: Secondary | ICD-10-CM | POA: Diagnosis not present

## 2023-05-01 DIAGNOSIS — I700 Atherosclerosis of aorta: Secondary | ICD-10-CM | POA: Diagnosis not present

## 2023-05-01 DIAGNOSIS — Z1389 Encounter for screening for other disorder: Secondary | ICD-10-CM | POA: Diagnosis not present

## 2023-05-01 DIAGNOSIS — Z23 Encounter for immunization: Secondary | ICD-10-CM | POA: Diagnosis not present

## 2023-05-01 DIAGNOSIS — K635 Polyp of colon: Secondary | ICD-10-CM | POA: Diagnosis not present

## 2023-05-01 DIAGNOSIS — Z0000 Encounter for general adult medical examination without abnormal findings: Secondary | ICD-10-CM | POA: Diagnosis not present

## 2023-05-07 ENCOUNTER — Ambulatory Visit (INDEPENDENT_AMBULATORY_CARE_PROVIDER_SITE_OTHER): Payer: PPO | Admitting: Family Medicine

## 2023-05-07 ENCOUNTER — Telehealth: Payer: Self-pay

## 2023-05-07 ENCOUNTER — Encounter: Payer: Self-pay | Admitting: Family Medicine

## 2023-05-07 VITALS — BP 110/52 | HR 72 | Temp 98.4°F | Ht 63.0 in | Wt 177.0 lb

## 2023-05-07 DIAGNOSIS — Z0000 Encounter for general adult medical examination without abnormal findings: Secondary | ICD-10-CM

## 2023-05-07 DIAGNOSIS — D649 Anemia, unspecified: Secondary | ICD-10-CM

## 2023-05-07 DIAGNOSIS — E559 Vitamin D deficiency, unspecified: Secondary | ICD-10-CM

## 2023-05-07 LAB — HEPATIC FUNCTION PANEL
ALT: 20 U/L (ref 0–35)
AST: 22 U/L (ref 0–37)
Albumin: 4 g/dL (ref 3.5–5.2)
Alkaline Phosphatase: 52 U/L (ref 39–117)
Bilirubin, Direct: 0.1 mg/dL (ref 0.0–0.3)
Total Bilirubin: 0.6 mg/dL (ref 0.2–1.2)
Total Protein: 6.4 g/dL (ref 6.0–8.3)

## 2023-05-07 LAB — VITAMIN B12: Vitamin B-12: 421 pg/mL (ref 211–911)

## 2023-05-07 LAB — CBC WITH DIFFERENTIAL/PLATELET
Basophils Absolute: 0 10*3/uL (ref 0.0–0.1)
Basophils Relative: 0.5 % (ref 0.0–3.0)
Eosinophils Absolute: 0 10*3/uL (ref 0.0–0.7)
Eosinophils Relative: 0.2 % (ref 0.0–5.0)
HCT: 33.2 % — ABNORMAL LOW (ref 36.0–46.0)
Hemoglobin: 10.5 g/dL — ABNORMAL LOW (ref 12.0–15.0)
Lymphocytes Relative: 18.7 % (ref 12.0–46.0)
Lymphs Abs: 1.3 10*3/uL (ref 0.7–4.0)
MCHC: 31.5 g/dL (ref 30.0–36.0)
MCV: 89.7 fL (ref 78.0–100.0)
Monocytes Absolute: 0.6 10*3/uL (ref 0.1–1.0)
Monocytes Relative: 8.7 % (ref 3.0–12.0)
Neutro Abs: 5.2 10*3/uL (ref 1.4–7.7)
Neutrophils Relative %: 71.9 % (ref 43.0–77.0)
Platelets: 173 10*3/uL (ref 150.0–400.0)
RBC: 3.7 Mil/uL — ABNORMAL LOW (ref 3.87–5.11)
RDW: 15.5 % (ref 11.5–15.5)
WBC: 7.2 10*3/uL (ref 4.0–10.5)

## 2023-05-07 LAB — BASIC METABOLIC PANEL
BUN: 39 mg/dL — ABNORMAL HIGH (ref 6–23)
CO2: 25 meq/L (ref 19–32)
Calcium: 9.7 mg/dL (ref 8.4–10.5)
Chloride: 111 meq/L (ref 96–112)
Creatinine, Ser: 1.24 mg/dL — ABNORMAL HIGH (ref 0.40–1.20)
GFR: 42.6 mL/min — ABNORMAL LOW (ref >60.00)
Glucose, Bld: 40 mg/dL — CL (ref 70–99)
Potassium: 3.6 meq/L (ref 3.5–5.1)
Sodium: 143 meq/L (ref 135–145)

## 2023-05-07 LAB — IBC + FERRITIN
Ferritin: 83 ng/mL (ref 10.0–291.0)
Iron: 88 ug/dL (ref 42–145)
Saturation Ratios: 31 % (ref 20.0–50.0)
TIBC: 284.2 ug/dL (ref 250.0–450.0)
Transferrin: 203 mg/dL — ABNORMAL LOW (ref 212.0–360.0)

## 2023-05-07 LAB — LIPID PANEL
Cholesterol: 116 mg/dL (ref 0–200)
HDL: 41.7 mg/dL (ref >39.00)
LDL Cholesterol: 57 mg/dL (ref 0–99)
NonHDL: 73.92
Total CHOL/HDL Ratio: 3
Triglycerides: 83 mg/dL (ref 0.0–149.0)
VLDL: 16.6 mg/dL (ref 0.0–40.0)

## 2023-05-07 LAB — VITAMIN D 25 HYDROXY (VIT D DEFICIENCY, FRACTURES): VITD: 100.09 ng/mL — ABNORMAL HIGH (ref 30.00–100.00)

## 2023-05-07 LAB — HEMOGLOBIN A1C: Hgb A1c MFr Bld: 5.8 % (ref 4.6–6.5)

## 2023-05-07 LAB — FOLATE: Folate: 12.3 ng/mL (ref >5.9)

## 2023-05-07 LAB — TSH: TSH: 2.86 u[IU]/mL (ref 0.35–5.50)

## 2023-05-07 NOTE — Progress Notes (Signed)
Subjective:    Patient ID: [name], female    DOB: May 20, 1946, 77 y.o.   MRN: [id number]  HPI Here for a well exam. She saw Dr. Excell Seltzer on 03-28-23, and she was doing well. No chest pain or SOB. Her BP has been well controlled. She sees Dr. Evlyn Kanner for her diabetes, and she says this has been well controlled. Her low back pain and leg weakness is stable.    Review of Systems  Constitutional: Negative.   HENT: Negative.    Eyes: Negative.   Respiratory: Negative.    Cardiovascular: Negative.   Gastrointestinal: Negative.   Genitourinary:  Negative for decreased urine volume, difficulty urinating, dyspareunia, dysuria, enuresis, flank pain, frequency, hematuria, pelvic pain and urgency.  Musculoskeletal:  Positive for back pain.  Skin: Negative.   Neurological:  Positive for weakness. Negative for headaches.  Psychiatric/Behavioral: Negative.         Objective:   Physical Exam Constitutional:      General: She is not in acute distress.    Appearance: She is well-developed.     Comments: In her wheelchair   HENT:     Head: Normocephalic and atraumatic.     Right Ear: External ear normal.     Left Ear: External ear normal.     Nose: Nose normal.     Mouth/Throat:     Pharynx: No oropharyngeal exudate.  Eyes:     General: No scleral icterus.    Conjunctiva/sclera: Conjunctivae normal.     Pupils: Pupils are equal, round, and reactive to light.  Neck:     Thyroid: No thyromegaly.     Vascular: No JVD.  Cardiovascular:     Rate and Rhythm: Normal rate and regular rhythm.     Pulses: Normal pulses.     Heart sounds: Normal heart sounds. No murmur heard.    No friction rub. No gallop.  Pulmonary:     Effort: Pulmonary effort is normal. No respiratory distress.     Breath sounds: Normal breath sounds. No wheezing or rales.  Chest:     Chest wall: No tenderness.  Abdominal:     General: Bowel sounds are normal. There is no distension.     Palpations: Abdomen is  soft. There is no mass.     Tenderness: There is no abdominal tenderness. There is no guarding or rebound.  Musculoskeletal:        General: No tenderness. Normal range of motion.     Cervical back: Normal range of motion and neck supple.  Lymphadenopathy:     Cervical: No cervical adenopathy.  Skin:    General: Skin is warm and dry.     Findings: No erythema or rash.  Neurological:     General: No focal deficit present.     Mental Status: She is alert and oriented to person, place, and time.     Cranial Nerves: No cranial nerve deficit.     Motor: No abnormal muscle tone.     Coordination: Coordination normal.     Deep Tendon Reflexes: Reflexes are normal and symmetric. Reflexes normal.  Psychiatric:        Mood and Affect: Mood normal.        Behavior: Behavior normal.        Thought Content: Thought content normal.        Judgment: Judgment normal.           Assessment & Plan:  Well exam. We discussed diet and  exercise. Get fasting labs. Gershon Crane, MD

## 2023-05-07 NOTE — Telephone Encounter (Signed)
Noted. We phoned her to advise her to drink some soda or orange juice and to eat some peanut butter crackers. She should report this to Dr. Evlyn Kanner, who manages her diabetes.

## 2023-05-07 NOTE — Telephone Encounter (Signed)
CRITICAL VALUE STICKER  CRITICAL VALUE: Glucose 40  RECEIVER (on-site recipient of call): Tay  DATE & TIME NOTIFIED:  05/07/23 4:16  MESSENGER (representative from lab): Charolette Forward  MD NOTIFIED: Dr Clent Ridges  TIME OF NOTIFICATION:  RESPONSE:

## 2023-05-24 NOTE — Progress Notes (Signed)
   05/24/2023  Patient ID: [name], female   DOB: March 13, 1946, 77 y.o.   MRN: [id number]  Pharmacy Quality Measure Review  This patient is appearing on a report for being at risk of failing the adherence measure for cholesterol (statin) medications this calendar year.   Medication: Rosuvastatin 40mg  Last fill date: 05/18/23 for 30 day supply  Insurance report was not up to date. No action needed at this time.   Sherrill Raring, PharmD Clinical Pharmacist [phone]

## 2023-05-28 DIAGNOSIS — M25572 Pain in left ankle and joints of left foot: Secondary | ICD-10-CM | POA: Diagnosis not present

## 2023-05-28 DIAGNOSIS — M170 Bilateral primary osteoarthritis of knee: Secondary | ICD-10-CM | POA: Diagnosis not present

## 2023-06-14 ENCOUNTER — Telehealth: Payer: Self-pay | Admitting: Cardiovascular Disease

## 2023-06-14 MED ORDER — IRBESARTAN 300 MG PO TABS: 300.0000 mg | ORAL_TABLET | Freq: Every day | ORAL | 3 refills | Status: DC

## 2023-06-14 NOTE — Telephone Encounter (Signed)
Rx refill sent to pharmacy. 

## 2023-06-14 NOTE — Telephone Encounter (Signed)
*  STAT* If patient is at the pharmacy, call can be transferred to refill team.   1. Which medications need to be refilled? (please list name of each medication and dose if known)   irbesartan (AVAPRO) 300 MG tablet    2. Which pharmacy/location (including street and city if local pharmacy) is medication to be sent to?  [id number] - [place], [place] - [address]      3. Do they need a 30 day or 90 day supply? 90 day    Pt is out of medication

## 2023-06-18 ENCOUNTER — Other Ambulatory Visit: Payer: Self-pay

## 2023-06-18 MED ORDER — ROSUVASTATIN CALCIUM 40 MG PO TABS: 40.0000 mg | ORAL_TABLET | Freq: Every day | ORAL | 2 refills | Status: DC

## 2023-06-21 ENCOUNTER — Other Ambulatory Visit: Payer: Self-pay

## 2023-06-21 ENCOUNTER — Telehealth: Payer: Self-pay | Admitting: Family Medicine

## 2023-06-21 DIAGNOSIS — I779 Disorder of arteries and arterioles, unspecified: Secondary | ICD-10-CM

## 2023-06-21 NOTE — Telephone Encounter (Signed)
Pt need maybe carotid doppler test. Pt seen dr Leonette Most fields in the past. Pt is not sure what test she needs

## 2023-06-21 NOTE — Progress Notes (Signed)
Order for bilateral breast MRI sent to [facility].  Fax confirmation received.

## 2023-06-24 NOTE — Telephone Encounter (Signed)
Attempted to contact pt regarding further information on this message no option to speak with pt will try again later

## 2023-06-24 NOTE — Telephone Encounter (Signed)
I think that is a good idea since it has been 2 years since she had one. I did order the carotid doppler test

## 2023-06-24 NOTE — Telephone Encounter (Signed)
Spoke with pt stated that she gets Doppler Carotid done but she has not had one done recent, pt requests for this order be placed so she can get it done Please advise

## 2023-06-25 DIAGNOSIS — M170 Bilateral primary osteoarthritis of knee: Secondary | ICD-10-CM | POA: Diagnosis not present

## 2023-06-27 NOTE — Telephone Encounter (Signed)
Attempted to contact pt regarding Dr Clent Ridges advise. Pt mailbox is full and no option to leave a message. Sent a MyChart message to pt. Will try call pt later

## 2023-06-27 NOTE — Telephone Encounter (Unsigned)
Copied from CRM [id number]. Topic: Clinical - Lab/Test Results >> Jun 27, 2023 12:18 PM Almira Coaster wrote: Reason for CRM: Patient is returning a call she received from the office, I did mentioned that an order was placed for a carotid doppler test. She wanted to make sure that it was sent to Dr.Charles Fields office. Her best contact number is [phone]

## 2023-07-01 DIAGNOSIS — M170 Bilateral primary osteoarthritis of knee: Secondary | ICD-10-CM | POA: Diagnosis not present

## 2023-07-05 ENCOUNTER — Telehealth: Payer: Self-pay

## 2023-07-05 ENCOUNTER — Other Ambulatory Visit: Payer: Self-pay | Admitting: Family Medicine

## 2023-07-05 ENCOUNTER — Other Ambulatory Visit: Payer: Self-pay | Admitting: Physician Assistant

## 2023-07-05 DIAGNOSIS — K219 Gastro-esophageal reflux disease without esophagitis: Secondary | ICD-10-CM

## 2023-07-05 NOTE — Telephone Encounter (Signed)
Called pt and left message for pt to call back concerning her medication hydralazine. Pt was ordered Hydralazine 10 mg, but pt takes Hydralazine 100 mg, both are on pt medication list, needed pt to clarify.

## 2023-07-08 MED ORDER — HYDRALAZINE HCL 100 MG PO TABS: 100.0000 mg | ORAL_TABLET | Freq: Three times a day (TID) | ORAL | 2 refills | Status: DC

## 2023-07-08 NOTE — Telephone Encounter (Signed)
Patient returned call to office and verified that she takes hydralazine 100mg  three times daily and does NOT use an additional 10mg  tablet. Advised her I would delete the 10mg  that was sent in error by refill department and send correct dose to [facility] per her request.  Note placed on new rx asking pharmacy to disregard previous 10mg  dose.

## 2023-07-08 NOTE — Telephone Encounter (Signed)
Left detailed message per DPR for patient to call back and clarify if using Hydralazine 10mg  tablets in addition to the 100mg  TID that's listed on chart. Waiting for return call.

## 2023-07-09 DIAGNOSIS — M170 Bilateral primary osteoarthritis of knee: Secondary | ICD-10-CM | POA: Diagnosis not present

## 2023-07-12 ENCOUNTER — Telehealth (HOSPITAL_COMMUNITY): Payer: Self-pay

## 2023-07-12 ENCOUNTER — Telehealth: Payer: Self-pay | Admitting: *Deleted

## 2023-07-12 NOTE — Telephone Encounter (Signed)
 Copied from CRM [id number]. Topic: Clinical - Medication Question >> Jul 12, 2023 11:02 AM Drema MATSU wrote: Reason for CRM: Patient wants to know if it is still ok for her to take pantoprazole  (PROTONIX ) 40 MG tablet. She states that she has been taking it for 2 years and when she looked it up and it advised her that she should only be taking it for a few months. Patient is concerned.

## 2023-07-12 NOTE — Telephone Encounter (Signed)
 Attempted to contact the following patient three times over three weeks to attempt to schedule their VAS US  order. Unable to reach patient for scheduling purposes, will inform referring office. If i do not hear back from the patient in the next 7 days, the order is to be canceled per protocol.  07/12/23 voice mail box full 07/04/23 left message 06/25/23 left message

## 2023-07-15 NOTE — Telephone Encounter (Signed)
 It is fine to keep taking it, many patients take this for years

## 2023-07-16 NOTE — Telephone Encounter (Signed)
 Patient notified of update  and verbalized understanding.

## 2023-07-19 ENCOUNTER — Ambulatory Visit: Payer: PPO | Admitting: Family Medicine

## 2023-08-01 ENCOUNTER — Inpatient Hospital Stay: Admission: RE | Admit: 2023-08-01 | Payer: PPO | Source: Ambulatory Visit

## 2023-08-02 ENCOUNTER — Other Ambulatory Visit: Payer: Self-pay | Admitting: Family Medicine

## 2023-08-02 DIAGNOSIS — K219 Gastro-esophageal reflux disease without esophagitis: Secondary | ICD-10-CM

## 2023-08-06 ENCOUNTER — Ambulatory Visit (INDEPENDENT_AMBULATORY_CARE_PROVIDER_SITE_OTHER): Payer: PPO | Admitting: Family Medicine

## 2023-08-06 ENCOUNTER — Encounter: Payer: Self-pay | Admitting: Family Medicine

## 2023-08-06 VITALS — BP 110/70 | HR 74 | Temp 98.8°F | Wt 180.0 lb

## 2023-08-06 DIAGNOSIS — J40 Bronchitis, not specified as acute or chronic: Secondary | ICD-10-CM | POA: Diagnosis not present

## 2023-08-06 DIAGNOSIS — R0989 Other specified symptoms and signs involving the circulatory and respiratory systems: Secondary | ICD-10-CM

## 2023-08-06 LAB — POCT INFLUENZA A/B
Influenza A, POC: NEGATIVE
Influenza B, POC: NEGATIVE

## 2023-08-06 LAB — POC COVID19 BINAXNOW: SARS Coronavirus 2 Ag: NEGATIVE

## 2023-08-06 MED ORDER — AZITHROMYCIN 250 MG PO TABS
ORAL_TABLET | ORAL | 0 refills | Status: DC
Start: 2023-08-06 — End: 2023-09-17

## 2023-08-06 MED ORDER — METHYLPREDNISOLONE ACETATE 40 MG/ML IJ SUSP
40.0000 mg | Freq: Once | INTRAMUSCULAR | Status: AC
Start: 2023-08-06 — End: 2023-08-06
  Administered 2023-08-06: 40 mg via INTRAMUSCULAR

## 2023-08-06 MED ORDER — HYDROCODONE BIT-HOMATROP MBR 5-1.5 MG/5ML PO SOLN: 5.0000 mL | ORAL | 0 refills | Status: AC | PRN

## 2023-08-06 MED ORDER — METHYLPREDNISOLONE ACETATE 80 MG/ML IJ SUSP
80.0000 mg | Freq: Once | INTRAMUSCULAR | Status: AC
Start: 2023-08-06 — End: 2023-08-06
  Administered 2023-08-06: 80 mg via INTRAMUSCULAR

## 2023-08-06 NOTE — Progress Notes (Signed)
   Subjective:    Patient ID: [name], female    DOB: April 21, 1946, 78 y.o.   MRN: [id number]  HPI Here for 4 days of stuffy head, PND, ST, and coughing up yellow sputum. No fever or SOB.    Review of Systems  Constitutional: Negative.   HENT:  Positive for congestion, postnasal drip and sore throat. Negative for ear pain and sinus pressure.   Eyes: Negative.   Respiratory:  Positive for cough. Negative for shortness of breath.        Objective:   Physical Exam Constitutional:      Appearance: Normal appearance. She is not ill-appearing.  HENT:     Right Ear: Tympanic membrane, ear canal and external ear normal.     Left Ear: Tympanic membrane, ear canal and external ear normal.     Nose: Nose normal.     Mouth/Throat:     Pharynx: Oropharynx is clear.  Eyes:     Conjunctiva/sclera: Conjunctivae normal.  Pulmonary:     Effort: Pulmonary effort is normal.     Breath sounds: Wheezing and rhonchi present. No rales.  Lymphadenopathy:     Cervical: No cervical adenopathy.  Neurological:     Mental Status: She is alert.           Assessment & Plan:  Bronchitis, treat with a Zpack. Given a shot of DepoMedrol.  Gershon Crane, MD

## 2023-08-06 NOTE — Addendum Note (Signed)
Addended by: Carola Rhine on: 08/06/2023 11:35 AM   Modules accepted: Orders

## 2023-08-06 NOTE — Addendum Note (Signed)
Addended by: Carola Rhine on: 08/06/2023 04:44 PM   Modules accepted: Orders

## 2023-08-19 ENCOUNTER — Other Ambulatory Visit: Payer: Self-pay

## 2023-08-19 MED ORDER — CHLORTHALIDONE 25 MG PO TABS: 25.0000 mg | ORAL_TABLET | Freq: Every day | ORAL | 2 refills | Status: DC

## 2023-08-27 ENCOUNTER — Other Ambulatory Visit: Payer: Self-pay | Admitting: Cardiovascular Disease

## 2023-08-27 DIAGNOSIS — I10 Essential (primary) hypertension: Secondary | ICD-10-CM

## 2023-08-29 ENCOUNTER — Ambulatory Visit: Payer: PPO | Admitting: Family Medicine

## 2023-09-01 ENCOUNTER — Other Ambulatory Visit: Payer: Self-pay | Admitting: Family Medicine

## 2023-09-01 DIAGNOSIS — K219 Gastro-esophageal reflux disease without esophagitis: Secondary | ICD-10-CM

## 2023-09-03 ENCOUNTER — Ambulatory Visit: Payer: PPO | Admitting: Family Medicine

## 2023-09-10 DIAGNOSIS — Z23 Encounter for immunization: Secondary | ICD-10-CM | POA: Diagnosis not present

## 2023-09-10 DIAGNOSIS — F331 Major depressive disorder, recurrent, moderate: Secondary | ICD-10-CM | POA: Diagnosis not present

## 2023-09-10 DIAGNOSIS — Z853 Personal history of malignant neoplasm of breast: Secondary | ICD-10-CM | POA: Diagnosis not present

## 2023-09-10 DIAGNOSIS — Z8673 Personal history of transient ischemic attack (TIA), and cerebral infarction without residual deficits: Secondary | ICD-10-CM | POA: Diagnosis not present

## 2023-09-10 DIAGNOSIS — I129 Hypertensive chronic kidney disease with stage 1 through stage 4 chronic kidney disease, or unspecified chronic kidney disease: Secondary | ICD-10-CM | POA: Diagnosis not present

## 2023-09-10 DIAGNOSIS — I2510 Atherosclerotic heart disease of native coronary artery without angina pectoris: Secondary | ICD-10-CM | POA: Diagnosis not present

## 2023-09-10 DIAGNOSIS — E1159 Type 2 diabetes mellitus with other circulatory complications: Secondary | ICD-10-CM | POA: Diagnosis not present

## 2023-09-10 DIAGNOSIS — N1832 Chronic kidney disease, stage 3b: Secondary | ICD-10-CM | POA: Diagnosis not present

## 2023-09-10 DIAGNOSIS — Z794 Long term (current) use of insulin: Secondary | ICD-10-CM | POA: Diagnosis not present

## 2023-09-10 DIAGNOSIS — I700 Atherosclerosis of aorta: Secondary | ICD-10-CM | POA: Diagnosis not present

## 2023-09-10 DIAGNOSIS — E785 Hyperlipidemia, unspecified: Secondary | ICD-10-CM | POA: Diagnosis not present

## 2023-09-10 DIAGNOSIS — E669 Obesity, unspecified: Secondary | ICD-10-CM | POA: Diagnosis not present

## 2023-09-12 ENCOUNTER — Ambulatory Visit (HOSPITAL_COMMUNITY)
Admission: RE | Admit: 2023-09-12 | Discharge: 2023-09-12 | Disposition: A | Discharge status: Home / Self Care | Payer: PPO | Source: Ambulatory Visit | Attending: Family Medicine | Admitting: Family Medicine

## 2023-09-12 ENCOUNTER — Telehealth: Payer: Self-pay | Admitting: *Deleted

## 2023-09-12 DIAGNOSIS — I779 Disorder of arteries and arterioles, unspecified: Secondary | ICD-10-CM | POA: Insufficient documentation

## 2023-09-12 NOTE — Telephone Encounter (Signed)
 Copied from [id number]. Topic: General - Other >> Sep 12, 2023  3:58 PM Jon Gills C wrote: Reason for CRM: Patient called in stating she had received a missed call from someone from the office, is requesting them give her a callback

## 2023-09-13 NOTE — Telephone Encounter (Signed)
 Spoke with pt state that she already got this issue resolved

## 2023-09-17 ENCOUNTER — Encounter: Payer: Self-pay | Admitting: Family Medicine

## 2023-09-17 ENCOUNTER — Ambulatory Visit (INDEPENDENT_AMBULATORY_CARE_PROVIDER_SITE_OTHER): Admitting: Family Medicine

## 2023-09-17 VITALS — BP 110/56 | HR 65 | Temp 98.5°F | Wt 182.0 lb

## 2023-09-17 DIAGNOSIS — G4762 Sleep related leg cramps: Secondary | ICD-10-CM | POA: Diagnosis not present

## 2023-09-17 DIAGNOSIS — E1140 Type 2 diabetes mellitus with diabetic neuropathy, unspecified: Secondary | ICD-10-CM | POA: Insufficient documentation

## 2023-09-17 DIAGNOSIS — Z7984 Long term (current) use of oral hypoglycemic drugs: Secondary | ICD-10-CM

## 2023-09-17 MED ORDER — GABAPENTIN 100 MG PO CAPS: 100.0000 mg | ORAL_CAPSULE | Freq: Every day | ORAL | 5 refills | Status: DC

## 2023-09-17 NOTE — Progress Notes (Signed)
   Subjective:    Patient ID: [name], female    DOB: 04/26/1946, 78 y.o.   MRN: [id number]  HPI Here for several issues. First she has had burning pains in both legs and feet for the past year. This is slowly getting worse. She also has frequent cramps in her calves or feet at night. Her diabetes has been fairly well controlled, and her A1c last ocotober was 5.8%. she still sees Dr. Evlyn Kanner for this. Her B12 in October was normal at 421.    Review of Systems  Constitutional: Negative.   Respiratory: Negative.    Cardiovascular: Negative.   Musculoskeletal:  Positive for myalgias.       Objective:   Physical Exam Constitutional:      Appearance: Normal appearance.  Cardiovascular:     Rate and Rhythm: Normal rate and regular rhythm.     Pulses: Normal pulses.     Heart sounds: Normal heart sounds.  Pulmonary:     Effort: Pulmonary effort is normal.     Breath sounds: Normal breath sounds.  Musculoskeletal:     Comments: Her feet and calves are nor swollen or tender. Feet are warm. DP and PT pulses are normal   Neurological:     Mental Status: She is alert.           Assessment & Plan:  She has diabetic neuropathy in the feet, so she will try Gabapentin 100 mg at bedtime. She also has nocturnal leg cramps, and she will take 800 mg of magnesium at bedtime.  Gershon Crane, MD

## 2023-09-19 ENCOUNTER — Ambulatory Visit: Payer: PPO | Admitting: Family Medicine

## 2023-09-23 ENCOUNTER — Other Ambulatory Visit: Payer: PPO

## 2023-09-24 ENCOUNTER — Inpatient Hospital Stay: Admission: RE | Admit: 2023-09-24 | Payer: PPO | Source: Ambulatory Visit

## 2023-09-26 ENCOUNTER — Ambulatory Visit: Admitting: Family Medicine

## 2023-09-27 ENCOUNTER — Other Ambulatory Visit: Payer: Self-pay

## 2023-09-27 ENCOUNTER — Emergency Department (HOSPITAL_COMMUNITY)

## 2023-09-27 ENCOUNTER — Emergency Department (HOSPITAL_COMMUNITY)
Admission: EM | Admit: 2023-09-27 | Discharge: 2023-09-28 | Disposition: A | Discharge status: Home / Self Care | Attending: Emergency Medicine | Admitting: Emergency Medicine

## 2023-09-27 ENCOUNTER — Encounter (HOSPITAL_COMMUNITY): Payer: Self-pay

## 2023-09-27 DIAGNOSIS — Z79899 Other long term (current) drug therapy: Secondary | ICD-10-CM | POA: Diagnosis not present

## 2023-09-27 DIAGNOSIS — S4992XA Unspecified injury of left shoulder and upper arm, initial encounter: Secondary | ICD-10-CM | POA: Diagnosis not present

## 2023-09-27 DIAGNOSIS — Z981 Arthrodesis status: Secondary | ICD-10-CM | POA: Diagnosis not present

## 2023-09-27 DIAGNOSIS — S3991XA Unspecified injury of abdomen, initial encounter: Secondary | ICD-10-CM | POA: Diagnosis not present

## 2023-09-27 DIAGNOSIS — M79602 Pain in left arm: Secondary | ICD-10-CM | POA: Diagnosis present

## 2023-09-27 DIAGNOSIS — M79629 Pain in unspecified upper arm: Secondary | ICD-10-CM | POA: Diagnosis not present

## 2023-09-27 DIAGNOSIS — Z7982 Long term (current) use of aspirin: Secondary | ICD-10-CM | POA: Diagnosis not present

## 2023-09-27 DIAGNOSIS — S7012XA Contusion of left thigh, initial encounter: Secondary | ICD-10-CM

## 2023-09-27 DIAGNOSIS — Z794 Long term (current) use of insulin: Secondary | ICD-10-CM | POA: Diagnosis not present

## 2023-09-27 DIAGNOSIS — S3993XA Unspecified injury of pelvis, initial encounter: Secondary | ICD-10-CM | POA: Diagnosis not present

## 2023-09-27 DIAGNOSIS — I6381 Other cerebral infarction due to occlusion or stenosis of small artery: Secondary | ICD-10-CM | POA: Diagnosis not present

## 2023-09-27 DIAGNOSIS — S59912A Unspecified injury of left forearm, initial encounter: Secondary | ICD-10-CM | POA: Diagnosis not present

## 2023-09-27 DIAGNOSIS — R1010 Upper abdominal pain, unspecified: Secondary | ICD-10-CM | POA: Insufficient documentation

## 2023-09-27 DIAGNOSIS — M48061 Spinal stenosis, lumbar region without neurogenic claudication: Secondary | ICD-10-CM | POA: Diagnosis not present

## 2023-09-27 DIAGNOSIS — I10 Essential (primary) hypertension: Secondary | ICD-10-CM | POA: Diagnosis not present

## 2023-09-27 DIAGNOSIS — S72302A Unspecified fracture of shaft of left femur, initial encounter for closed fracture: Secondary | ICD-10-CM | POA: Diagnosis not present

## 2023-09-27 DIAGNOSIS — Y92410 Unspecified street and highway as the place of occurrence of the external cause: Secondary | ICD-10-CM | POA: Insufficient documentation

## 2023-09-27 DIAGNOSIS — I700 Atherosclerosis of aorta: Secondary | ICD-10-CM | POA: Diagnosis not present

## 2023-09-27 DIAGNOSIS — Z041 Encounter for examination and observation following transport accident: Secondary | ICD-10-CM | POA: Diagnosis not present

## 2023-09-27 DIAGNOSIS — S42202A Unspecified fracture of upper end of left humerus, initial encounter for closed fracture: Secondary | ICD-10-CM | POA: Diagnosis not present

## 2023-09-27 DIAGNOSIS — M5134 Other intervertebral disc degeneration, thoracic region: Secondary | ICD-10-CM | POA: Diagnosis not present

## 2023-09-27 DIAGNOSIS — M4317 Spondylolisthesis, lumbosacral region: Secondary | ICD-10-CM | POA: Diagnosis not present

## 2023-09-27 DIAGNOSIS — M545 Low back pain, unspecified: Secondary | ICD-10-CM | POA: Diagnosis not present

## 2023-09-27 DIAGNOSIS — R519 Headache, unspecified: Secondary | ICD-10-CM | POA: Diagnosis not present

## 2023-09-27 DIAGNOSIS — S40022A Contusion of left upper arm, initial encounter: Secondary | ICD-10-CM

## 2023-09-27 DIAGNOSIS — M79603 Pain in arm, unspecified: Secondary | ICD-10-CM | POA: Diagnosis not present

## 2023-09-27 DIAGNOSIS — M79622 Pain in left upper arm: Secondary | ICD-10-CM | POA: Diagnosis not present

## 2023-09-27 DIAGNOSIS — Z8673 Personal history of transient ischemic attack (TIA), and cerebral infarction without residual deficits: Secondary | ICD-10-CM | POA: Diagnosis not present

## 2023-09-27 DIAGNOSIS — S299XXA Unspecified injury of thorax, initial encounter: Secondary | ICD-10-CM | POA: Diagnosis not present

## 2023-09-27 DIAGNOSIS — S4991XA Unspecified injury of right shoulder and upper arm, initial encounter: Secondary | ICD-10-CM | POA: Diagnosis not present

## 2023-09-27 DIAGNOSIS — M542 Cervicalgia: Secondary | ICD-10-CM | POA: Diagnosis not present

## 2023-09-27 DIAGNOSIS — M40204 Unspecified kyphosis, thoracic region: Secondary | ICD-10-CM | POA: Diagnosis not present

## 2023-09-27 LAB — CBC WITH DIFFERENTIAL/PLATELET
Abs Immature Granulocytes: 0.1 10*3/uL — ABNORMAL HIGH (ref 0.00–0.07)
Basophils Absolute: 0 10*3/uL (ref 0.0–0.1)
Basophils Relative: 0 %
Eosinophils Absolute: 0.1 10*3/uL (ref 0.0–0.5)
Eosinophils Relative: 1 %
HCT: 35.7 % — ABNORMAL LOW (ref 36.0–46.0)
Hemoglobin: 11.1 g/dL — ABNORMAL LOW (ref 12.0–15.0)
Immature Granulocytes: 1 %
Lymphocytes Relative: 13 %
Lymphs Abs: 1.4 10*3/uL (ref 0.7–4.0)
MCH: 28.8 pg (ref 26.0–34.0)
MCHC: 31.1 g/dL (ref 30.0–36.0)
MCV: 92.7 fL (ref 80.0–100.0)
Monocytes Absolute: 0.8 10*3/uL (ref 0.1–1.0)
Monocytes Relative: 8 %
Neutro Abs: 8 10*3/uL — ABNORMAL HIGH (ref 1.7–7.7)
Neutrophils Relative %: 77 %
Platelets: 183 10*3/uL (ref 150–400)
RBC: 3.85 MIL/uL — ABNORMAL LOW (ref 3.87–5.11)
RDW: 16.8 % — ABNORMAL HIGH (ref 11.5–15.5)
WBC: 10.5 10*3/uL (ref 4.0–10.5)
nRBC: 0 % (ref 0.0–0.2)

## 2023-09-27 LAB — I-STAT CHEM 8, ED
BUN: 33 mg/dL — ABNORMAL HIGH (ref 8–23)
Calcium, Ion: 1.18 mmol/L (ref 1.15–1.40)
Chloride: 113 mmol/L — ABNORMAL HIGH (ref 98–111)
Creatinine, Ser: 1.7 mg/dL — ABNORMAL HIGH (ref 0.44–1.00)
Glucose, Bld: 54 mg/dL — ABNORMAL LOW (ref 70–99)
HCT: 34 % — ABNORMAL LOW (ref 36.0–46.0)
Hemoglobin: 11.6 g/dL — ABNORMAL LOW (ref 12.0–15.0)
Potassium: 4.8 mmol/L (ref 3.5–5.1)
Sodium: 144 mmol/L (ref 135–145)
TCO2: 23 mmol/L (ref 22–32)

## 2023-09-27 LAB — BASIC METABOLIC PANEL
Anion gap: 7 (ref 5–15)
BUN: 32 mg/dL — ABNORMAL HIGH (ref 8–23)
CO2: 26 mmol/L (ref 22–32)
Calcium: 9.5 mg/dL (ref 8.9–10.3)
Chloride: 111 mmol/L (ref 98–111)
Creatinine, Ser: 1.61 mg/dL — ABNORMAL HIGH (ref 0.44–1.00)
GFR, Estimated: 33 mL/min — ABNORMAL LOW (ref >60)
Glucose, Bld: 60 mg/dL — ABNORMAL LOW (ref 70–99)
Potassium: 5.1 mmol/L (ref 3.5–5.1)
Sodium: 144 mmol/L (ref 135–145)

## 2023-09-27 MED ORDER — MORPHINE SULFATE (PF) 4 MG/ML IV SOLN
4.0000 mg | Freq: Once | INTRAVENOUS | Status: AC
  Administered 2023-09-27: 4 mg via INTRAVENOUS
  Filled 2023-09-27: qty 1

## 2023-09-27 MED ORDER — IOHEXOL 350 MG/ML SOLN
50.0000 mL | Freq: Once | INTRAVENOUS | Status: AC | PRN
  Administered 2023-09-27: 50 mL via INTRAVENOUS

## 2023-09-27 NOTE — Discharge Instructions (Addendum)
 The x-rays and CT scans did not show any signs of serious injury.  You will be stiff and sore for the next several days.  Take over-the-counter medications and apply ice.  There was an incidental finding showing that there is slight displacement of the interbody spacer from your prior spine surgery at L2-L3.  This is likely not causing any trouble for you but please contact your spine surgeon to have him review these findings.
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
## 2023-10-01 DIAGNOSIS — M545 Low back pain, unspecified: Secondary | ICD-10-CM | POA: Diagnosis not present

## 2023-10-15 DIAGNOSIS — M7062 Trochanteric bursitis, left hip: Secondary | ICD-10-CM | POA: Diagnosis not present

## 2023-10-24 ENCOUNTER — Ambulatory Visit
Admission: RE | Admit: 2023-10-24 | Discharge: 2023-10-24 | Disposition: A | Discharge status: Home / Self Care | Source: Ambulatory Visit | Attending: Nurse Practitioner | Admitting: Nurse Practitioner

## 2023-10-24 DIAGNOSIS — Z853 Personal history of malignant neoplasm of breast: Secondary | ICD-10-CM | POA: Diagnosis not present

## 2023-10-24 DIAGNOSIS — D0512 Intraductal carcinoma in situ of left breast: Secondary | ICD-10-CM

## 2023-10-24 DIAGNOSIS — Z1239 Encounter for other screening for malignant neoplasm of breast: Secondary | ICD-10-CM | POA: Diagnosis not present

## 2023-10-24 MED ORDER — GADOPICLENOL 0.5 MMOL/ML IV SOLN
8.0000 mL | Freq: Once | INTRAVENOUS | Status: AC | PRN
Start: 2023-10-24 — End: 2023-10-24
  Administered 2023-10-24: 8 mL via INTRAVENOUS

## 2023-10-25 ENCOUNTER — Other Ambulatory Visit: Payer: Self-pay

## 2023-10-25 DIAGNOSIS — D0512 Intraductal carcinoma in situ of left breast: Secondary | ICD-10-CM

## 2023-10-25 NOTE — Progress Notes (Signed)
 MRI Breast Bilateral order sent to GI to be scheduled for 11/2024.  GI will contact pt closer to expect date to get her scheduled.

## 2023-10-28 ENCOUNTER — Emergency Department (HOSPITAL_COMMUNITY)

## 2023-10-28 ENCOUNTER — Other Ambulatory Visit: Payer: Self-pay

## 2023-10-28 ENCOUNTER — Emergency Department (HOSPITAL_COMMUNITY)
Admission: EM | Admit: 2023-10-28 | Discharge: 2023-10-28 | Disposition: A | Discharge status: Home / Self Care | Attending: Emergency Medicine | Admitting: Emergency Medicine

## 2023-10-28 ENCOUNTER — Encounter (HOSPITAL_COMMUNITY): Payer: Self-pay | Admitting: Emergency Medicine

## 2023-10-28 DIAGNOSIS — S32058A Other fracture of fifth lumbar vertebra, initial encounter for closed fracture: Secondary | ICD-10-CM | POA: Insufficient documentation

## 2023-10-28 DIAGNOSIS — M4310 Spondylolisthesis, site unspecified: Secondary | ICD-10-CM | POA: Diagnosis not present

## 2023-10-28 DIAGNOSIS — N200 Calculus of kidney: Secondary | ICD-10-CM | POA: Insufficient documentation

## 2023-10-28 DIAGNOSIS — M48061 Spinal stenosis, lumbar region without neurogenic claudication: Secondary | ICD-10-CM | POA: Diagnosis not present

## 2023-10-28 DIAGNOSIS — K5730 Diverticulosis of large intestine without perforation or abscess without bleeding: Secondary | ICD-10-CM | POA: Diagnosis not present

## 2023-10-28 DIAGNOSIS — Z4789 Encounter for other orthopedic aftercare: Secondary | ICD-10-CM | POA: Diagnosis not present

## 2023-10-28 DIAGNOSIS — S32009A Unspecified fracture of unspecified lumbar vertebra, initial encounter for closed fracture: Secondary | ICD-10-CM

## 2023-10-28 DIAGNOSIS — Z7982 Long term (current) use of aspirin: Secondary | ICD-10-CM | POA: Diagnosis not present

## 2023-10-28 DIAGNOSIS — M47816 Spondylosis without myelopathy or radiculopathy, lumbar region: Secondary | ICD-10-CM | POA: Diagnosis not present

## 2023-10-28 DIAGNOSIS — S32010A Wedge compression fracture of first lumbar vertebra, initial encounter for closed fracture: Secondary | ICD-10-CM | POA: Diagnosis not present

## 2023-10-28 DIAGNOSIS — M545 Low back pain, unspecified: Secondary | ICD-10-CM | POA: Diagnosis not present

## 2023-10-28 DIAGNOSIS — Y92410 Unspecified street and highway as the place of occurrence of the external cause: Secondary | ICD-10-CM | POA: Diagnosis not present

## 2023-10-28 LAB — CBG MONITORING, ED
Glucose-Capillary: 68 mg/dL — ABNORMAL LOW (ref 70–99)
Glucose-Capillary: 84 mg/dL (ref 70–99)

## 2023-10-28 MED ORDER — IBUPROFEN 600 MG PO TABS: 600.0000 mg | ORAL_TABLET | Freq: Four times a day (QID) | ORAL | 0 refills | Status: AC | PRN

## 2023-10-28 MED ORDER — IBUPROFEN 400 MG PO TABS
600.0000 mg | ORAL_TABLET | Freq: Once | ORAL | Status: AC
  Administered 2023-10-28: 600 mg via ORAL
  Filled 2023-10-28: qty 1

## 2023-10-28 MED ORDER — CYCLOBENZAPRINE HCL 10 MG PO TABS
5.0000 mg | ORAL_TABLET | Freq: Once | ORAL | Status: AC
  Administered 2023-10-28: 5 mg via ORAL
  Filled 2023-10-28: qty 1

## 2023-10-28 NOTE — Discharge Instructions (Signed)
 Please use pain medicine as prescribed.  Follow-up with your doctor.  Return if you are having new or worsening symptoms
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
## 2023-10-28 NOTE — ED Notes (Signed)
 Patient verbalized understanding of discharge instructions, patient does not have any concerns at this time. Patient has been wheelchaired out to ED lobby. Patient stated she will call an uber to taker her home.

## 2023-10-28 NOTE — ED Notes (Signed)
 CBG 68, juice provided, RN aware
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
## 2023-10-29 ENCOUNTER — Telehealth: Payer: Self-pay

## 2023-10-29 NOTE — Transitions of Care (Post Inpatient/ED Visit) (Signed)
   10/29/2023  Name: [name] MRN: [id number] DOB: 1945-08-14  Today's TOC FU Call Status: Today's TOC FU Call Status:: Unsuccessful Call (1st Attempt) Unsuccessful Call (1st Attempt) Date: 10/29/23  Attempted to reach the patient regarding the most recent Inpatient/ED visit.  Follow Up Plan: Additional outreach attempts will be made to reach the patient to complete the Transitions of Care (Post Inpatient/ED visit) call.   Signature  Seabron [place], LPN [facility] Health Advisor [place] [facility] You Are. We Are. One [facility] Direct Dial [phone]

## 2023-10-30 ENCOUNTER — Other Ambulatory Visit: Payer: Self-pay | Admitting: Family Medicine

## 2023-10-30 ENCOUNTER — Ambulatory Visit: Payer: Self-pay | Admitting: Family Medicine

## 2023-10-30 DIAGNOSIS — K219 Gastro-esophageal reflux disease without esophagitis: Secondary | ICD-10-CM
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
## 2023-11-01 ENCOUNTER — Other Ambulatory Visit (HOSPITAL_COMMUNITY): Payer: Self-pay

## 2023-11-01 MED ORDER — CYCLOBENZAPRINE HCL 10 MG PO TABS: 10.0000 mg | ORAL_TABLET | Freq: Three times a day (TID) | ORAL | 2 refills | Status: AC | PRN

## 2023-11-01 NOTE — Telephone Encounter (Signed)
 I sent in the Cyclobenzaprine 

## 2023-11-01 NOTE — Addendum Note (Signed)
 Addended by: Corita Diego A on: 11/01/2023 08:31 AM   Modules accepted: Orders

## 2023-11-04 ENCOUNTER — Telehealth: Payer: Self-pay

## 2023-11-04 ENCOUNTER — Other Ambulatory Visit (HOSPITAL_COMMUNITY): Payer: Self-pay

## 2023-11-04 NOTE — Telephone Encounter (Signed)
 Pharmacy Patient Advocate Encounter   Received notification from CoverMyMeds that prior authorization for Cyclobenzaprine  HCl 10MG  tabletsis required/requested.   Insurance verification completed.   The patient is insured through [facility] ADVANTAGE/RX ADVANCE .   Per test claim: PA required; PA submitted to above mentioned insurance via CoverMyMeds Key/confirmation #/EOC [facility] Status is pending

## 2023-11-04 NOTE — Telephone Encounter (Signed)
 Copied from CRM [id number]. Topic: General - Other >> Nov 04, 2023 12:51 PM Alyse July wrote: Reason for CRM: patient would like to know the results of Breast MRI and Vein result. [id number]. Patient is hearing impair if patient is unable to hear phone when OB call is made a detailed message can be left.

## 2023-11-04 NOTE — Telephone Encounter (Signed)
 Pt Breast MRI results are not back,the office will contact pt when results are back

## 2023-11-05 NOTE — Telephone Encounter (Signed)
 Left a detailed message for pt advised to pick up Rx from her pharmacy if not already.

## 2023-11-05 NOTE — Telephone Encounter (Signed)
 Pharmacy Patient Advocate Encounter  Received notification from [facility] ADVANTAGE/RX ADVANCE that Prior Authorization for Cyclobenzaprine  HCl 10MG  tablets has been DENIED.  Full denial letter will be uploaded to the media tab. See denial reason below.  The following prior authorization criteria were not met: 4) Prescriber acknowledges anticholinergic risks (e.g., confusion, dry mouth, blurry vision, constipation, urinary retention) and will consider lowering the dose or discontinuing medication(s) that are no longer clinically warranted for the patient  PA #/Case ID/Reference #: [id number]

## 2023-11-06 ENCOUNTER — Telehealth: Payer: Self-pay

## 2023-11-06 ENCOUNTER — Telehealth: Payer: Self-pay | Admitting: Family Medicine

## 2023-11-06 NOTE — Telephone Encounter (Signed)
 Copied from CRM [id number]. Topic: Clinical - Lab/Test Results >> Nov 06, 2023  4:56 PM Tiffany S wrote: Reason for CRM: Patient is requesting call back for imaging results MRI and vein test

## 2023-11-06 NOTE — Telephone Encounter (Signed)
 Copied from CRM [id number]. Topic: General - Other >> Nov 06, 2023 11:31 AM [place] E wrote: Reason for CRM: Angie from [facility] called on behalf of the patient. Patient is requesting if she can have a rollator walker with a seat. Please call back patient and advise if this is doable.

## 2023-11-07 DIAGNOSIS — M25551 Pain in right hip: Secondary | ICD-10-CM | POA: Diagnosis not present

## 2023-11-07 DIAGNOSIS — M25561 Pain in right knee: Secondary | ICD-10-CM | POA: Diagnosis not present

## 2023-11-07 DIAGNOSIS — M545 Low back pain, unspecified: Secondary | ICD-10-CM | POA: Diagnosis not present

## 2023-11-07 NOTE — Telephone Encounter (Signed)
 Left a message for pt advised that Dr Auston Blush, MD office will contact pt with results when read

## 2023-11-08 NOTE — Telephone Encounter (Signed)
The RX is ready to pick up

## 2023-11-11 NOTE — Telephone Encounter (Signed)
 Spoke with Radiology Dep regarding pt Breast MRI results, state that pt results were sent to Burton, [name] K, NP on 10/24/23, advised pt to contact their office for results

## 2023-11-11 NOTE — Telephone Encounter (Signed)
 Pt Rx for a Rolator walker with seat was faxed to [facility], pt notified

## 2023-11-12 DIAGNOSIS — M48062 Spinal stenosis, lumbar region with neurogenic claudication: Secondary | ICD-10-CM | POA: Diagnosis not present

## 2023-11-12 NOTE — Telephone Encounter (Signed)
 Pt Rx for Flexeril  was denied, per Dr Alyne Babinski request PA was sent to prior authorization team for resubmitting

## 2023-11-12 NOTE — Telephone Encounter (Signed)
**Note De-identified  [place] Obfuscation** Please advise 

## 2023-11-12 NOTE — Telephone Encounter (Signed)
 Please resubmit the PA with my acknowledgement of these risks (she still needs the medication)

## 2023-11-14 ENCOUNTER — Other Ambulatory Visit: Payer: Self-pay

## 2023-11-14 ENCOUNTER — Encounter: Payer: Self-pay | Admitting: Nurse Practitioner

## 2023-11-21 ENCOUNTER — Telehealth: Payer: Self-pay

## 2023-11-21 NOTE — Telephone Encounter (Signed)
 The patient contacted the office and requested to speak with [name], NP, regarding her MRI. I informed the patient that [name] reviewed her breast MRI from October 21, 2023, which was negative for cancer. The patient acknowledged this information verbally and had no further questions or concerns at this time.

## 2023-11-25 ENCOUNTER — Other Ambulatory Visit: Payer: Self-pay

## 2023-11-25 MED ORDER — SPIRONOLACTONE 50 MG PO TABS: 50.0000 mg | ORAL_TABLET | Freq: Every day | ORAL | 1 refills | Status: DC

## 2023-12-04 DIAGNOSIS — Z1231 Encounter for screening mammogram for malignant neoplasm of breast: Secondary | ICD-10-CM | POA: Diagnosis not present

## 2023-12-04 LAB — HM MAMMOGRAPHY

## 2023-12-05 ENCOUNTER — Encounter: Payer: Self-pay | Admitting: Family Medicine

## 2023-12-10 ENCOUNTER — Ambulatory Visit: Admitting: Family Medicine

## 2023-12-17 DIAGNOSIS — R262 Difficulty in walking, not elsewhere classified: Secondary | ICD-10-CM | POA: Diagnosis not present

## 2023-12-19 DIAGNOSIS — R262 Difficulty in walking, not elsewhere classified: Secondary | ICD-10-CM | POA: Diagnosis not present

## 2023-12-22 ENCOUNTER — Other Ambulatory Visit: Payer: Self-pay | Admitting: Family Medicine

## 2023-12-22 DIAGNOSIS — K219 Gastro-esophageal reflux disease without esophagitis: Secondary | ICD-10-CM

## 2023-12-26 DIAGNOSIS — R262 Difficulty in walking, not elsewhere classified: Secondary | ICD-10-CM | POA: Diagnosis not present

## 2023-12-30 ENCOUNTER — Other Ambulatory Visit: Payer: Self-pay

## 2024-01-01 DIAGNOSIS — R262 Difficulty in walking, not elsewhere classified: Secondary | ICD-10-CM | POA: Diagnosis not present

## 2024-01-03 DIAGNOSIS — R262 Difficulty in walking, not elsewhere classified: Secondary | ICD-10-CM | POA: Diagnosis not present

## 2024-01-07 ENCOUNTER — Encounter: Payer: Self-pay | Admitting: Family Medicine

## 2024-01-07 ENCOUNTER — Ambulatory Visit: Admitting: Family Medicine

## 2024-01-07 VITALS — BP 104/58 | Temp 97.9°F | Wt 171.0 lb

## 2024-01-07 DIAGNOSIS — S32058D Other fracture of fifth lumbar vertebra, subsequent encounter for fracture with routine healing: Secondary | ICD-10-CM

## 2024-01-07 DIAGNOSIS — N200 Calculus of kidney: Secondary | ICD-10-CM

## 2024-01-07 DIAGNOSIS — I878 Other specified disorders of veins: Secondary | ICD-10-CM | POA: Diagnosis not present

## 2024-01-07 DIAGNOSIS — S32059D Unspecified fracture of fifth lumbar vertebra, subsequent encounter for fracture with routine healing: Secondary | ICD-10-CM | POA: Insufficient documentation

## 2024-01-07 LAB — POC URINALSYSI DIPSTICK (AUTOMATED)
Bilirubin, UA: NEGATIVE
Blood, UA: NEGATIVE
Glucose, UA: POSITIVE — AB
Ketones, UA: NEGATIVE
Leukocytes, UA: NEGATIVE
Nitrite, UA: NEGATIVE
Protein, UA: POSITIVE — AB
Spec Grav, UA: 1.025 (ref 1.010–1.025)
Urobilinogen, UA: 0.2 U/dL
pH, UA: 5.5 (ref 5.0–8.0)
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
## 2024-01-07 NOTE — Addendum Note (Signed)
 Addended by: LADONNA INOCENTE SAILOR on: 01/07/2024 03:28 PM   Modules accepted: Orders

## 2024-01-08 DIAGNOSIS — M7731 Calcaneal spur, right foot: Secondary | ICD-10-CM | POA: Diagnosis not present

## 2024-01-08 DIAGNOSIS — M792 Neuralgia and neuritis, unspecified: Secondary | ICD-10-CM | POA: Diagnosis not present

## 2024-01-08 DIAGNOSIS — M19072 Primary osteoarthritis, left ankle and foot: Secondary | ICD-10-CM | POA: Diagnosis not present

## 2024-01-08 DIAGNOSIS — M7751 Other enthesopathy of right foot: Secondary | ICD-10-CM | POA: Diagnosis not present

## 2024-01-08 DIAGNOSIS — M7661 Achilles tendinitis, right leg: Secondary | ICD-10-CM | POA: Diagnosis not present

## 2024-01-08 DIAGNOSIS — E1142 Type 2 diabetes mellitus with diabetic polyneuropathy: Secondary | ICD-10-CM | POA: Diagnosis not present

## 2024-01-08 DIAGNOSIS — M19071 Primary osteoarthritis, right ankle and foot: Secondary | ICD-10-CM | POA: Diagnosis not present

## 2024-01-14 DIAGNOSIS — M545 Low back pain, unspecified: Secondary | ICD-10-CM | POA: Diagnosis not present

## 2024-01-16 DIAGNOSIS — Z794 Long term (current) use of insulin: Secondary | ICD-10-CM | POA: Diagnosis not present

## 2024-01-16 DIAGNOSIS — I129 Hypertensive chronic kidney disease with stage 1 through stage 4 chronic kidney disease, or unspecified chronic kidney disease: Secondary | ICD-10-CM | POA: Diagnosis not present

## 2024-01-16 DIAGNOSIS — Z853 Personal history of malignant neoplasm of breast: Secondary | ICD-10-CM | POA: Diagnosis not present

## 2024-01-16 DIAGNOSIS — H409 Unspecified glaucoma: Secondary | ICD-10-CM | POA: Diagnosis not present

## 2024-01-16 DIAGNOSIS — R262 Difficulty in walking, not elsewhere classified: Secondary | ICD-10-CM | POA: Diagnosis not present

## 2024-01-16 DIAGNOSIS — N200 Calculus of kidney: Secondary | ICD-10-CM | POA: Diagnosis not present

## 2024-01-16 DIAGNOSIS — N1832 Chronic kidney disease, stage 3b: Secondary | ICD-10-CM | POA: Diagnosis not present

## 2024-01-16 DIAGNOSIS — I2510 Atherosclerotic heart disease of native coronary artery without angina pectoris: Secondary | ICD-10-CM | POA: Diagnosis not present

## 2024-01-16 DIAGNOSIS — I6523 Occlusion and stenosis of bilateral carotid arteries: Secondary | ICD-10-CM | POA: Diagnosis not present

## 2024-01-16 DIAGNOSIS — F331 Major depressive disorder, recurrent, moderate: Secondary | ICD-10-CM | POA: Diagnosis not present

## 2024-01-16 DIAGNOSIS — D631 Anemia in chronic kidney disease: Secondary | ICD-10-CM | POA: Diagnosis not present

## 2024-01-16 DIAGNOSIS — E1159 Type 2 diabetes mellitus with other circulatory complications: Secondary | ICD-10-CM | POA: Diagnosis not present

## 2024-01-16 DIAGNOSIS — D860 Sarcoidosis of lung: Secondary | ICD-10-CM | POA: Diagnosis not present

## 2024-01-21 DIAGNOSIS — R262 Difficulty in walking, not elsewhere classified: Secondary | ICD-10-CM | POA: Diagnosis not present

## 2024-01-24 ENCOUNTER — Ambulatory Visit (INDEPENDENT_AMBULATORY_CARE_PROVIDER_SITE_OTHER): Payer: PPO | Admitting: Otolaryngology

## 2024-02-14 DIAGNOSIS — M545 Low back pain, unspecified: Secondary | ICD-10-CM | POA: Diagnosis not present

## 2024-02-17 ENCOUNTER — Ambulatory Visit
Admission: EM | Admit: 2024-02-17 | Discharge: 2024-02-17 | Disposition: A | Discharge status: Home / Self Care | Attending: Emergency Medicine | Admitting: Emergency Medicine

## 2024-02-17 DIAGNOSIS — H6121 Impacted cerumen, right ear: Secondary | ICD-10-CM | POA: Diagnosis not present

## 2024-02-17 NOTE — ED Provider Notes (Signed)
 HPI  SUBJECTIVE:  [name] is a 78 y.o. female who presents with decreased hearing in her right ear yesterday.  She states that it feels stopped up.  She had rhinorrhea several days ago, but this has resolved.  She denies pain, otorrhea, fevers, nasal congestion, foreign body insertion, recent swimming.  No aggravating or alleviating factors.  She has not tried anything for her symptoms. She has a past medical history of coronary artery disease, carotid artery occlusion, fibromyalgia, GERD, glaucoma, hearing loss left ear, hypertension, hyperlipidemia, peripheral vascular disease, TIA, diabetes.  ENT: [facility].  Past Medical History:  Diagnosis Date   Abnormal nuclear stress test 12/29/2014   Allergy    Anemia, unspecified    Arthritis    Blood transfusion without reported diagnosis    2018   CAD (coronary artery disease)    sees. Dr. Wonda. s/p cath with PCI of RCA (xience des) June 2010. Pt also with LAD and D2 dzs-NL FFR in both areas med Rx   Carotid artery occlusion    Cataract    sees Dr. Cleatus    Depressive disorder, not elsewhere classified    Duodenal nodule    Edema    Fibromyalgia    Gastric polyps    COLON   GERD (gastroesophageal reflux disease)    Glaucoma    narrow angle, sees Dr. Cleatus Spike)    Hearing loss    left ear   Heart murmur    questionable   Hemorrhoids    HSV (herpes simplex virus) anogenital infection 05/2014   HTN (hypertension)    Hyperlipidemia    Low back pain    she sees Dr. Lucilla, also Dr. Eldonna for pain medications and Lynwood Balloon PA for injections    Obesity    Osteopenia 01/2018   T score -1.6 FRAX 7.1% / 1.4%   Peripheral vascular disease (HCC)    Resistant hypertension 02/21/2007   Qualifier: Diagnosis of  By: Sherron CMA, Steen     Stroke [facility])    TIA  many yrs ago     TIA (transient ischemic attack)    also Hx of it.    Type II or unspecified type diabetes mellitus without mention of  complication, not stated as uncontrolled    sees Dr. Marcey Ore     Past Surgical History:  Procedure Laterality Date   ABDOMINAL HYSTERECTOMY  age 52   TAH and USO  Leiomyomata   ANTERIOR CERVICAL CORPECTOMY N/A 06/11/2014   Procedure: ANTERIOR CERVICAL CORPECTOMY CERVICAL SIX; WITH CERVICAL FIVE TO CERVICAL SEVEN ARTHRODESIS;  Surgeon: Rockey Peru, MD;  Location: [facility];  Service: Neurosurgery;  Laterality: N/A;   BACK SURGERY  2008   lumb fusion   BREAST BIOPSY Left 01/26/2014   Procedure: EXCISION LEFT BREAST MASS;  Surgeon: Elon CHRISTELLA Pacini, MD;  Location: [facility];  Service: General;  Laterality: Left;   BREAST LUMPECTOMY WITH RADIOACTIVE SEED LOCALIZATION Left 07/24/2017   Procedure: LEFT BREAST LUMPECTOMY WITH RADIOACTIVE SEED LOCALIZATION ERAS;  Surgeon: Vanderbilt Ned, MD;  Location: [facility];  Service: General;  Laterality: Left;   BREAST LUMPECTOMY WITH RADIOACTIVE SEED LOCALIZATION Left 01/13/2020   Procedure: LEFT BREAST LUMPECTOMY WITH RADIOACTIVE SEED LOCALIZATION;  Surgeon: Vanderbilt Ned, MD;  Location: [facility];  Service: General;  Laterality: Left;   CARDIAC CATHETERIZATION     CARDIAC CATHETERIZATION N/A 12/29/2014   Procedure: Left Heart Cath and Coronary Angiography;  Surgeon: Ozell Fell, MD;  Location: [facility];  Service: Cardiovascular;  Laterality: N/A;   CAROTID ENDARTERECTOMY  09/2010   per Dr. Carlin Goody   CARPAL TUNNEL RELEASE Left 05/14/2014   Procedure: Left Carpal tunnel release;  Surgeon: Rockey Peru, MD;  Location: [facility];  Service: Neurosurgery;  Laterality: Left;  Left Carpal tunnel release   CARPAL TUNNEL RELEASE Bilateral    CHOLECYSTECTOMY     COLONOSCOPY  08/14/2022   per Dr. Abran, adenomatous polyps, repeat in 5 yrs   CORONARY STENT PLACEMENT     Dr. Fell   DILATION AND CURETTAGE OF UTERUS     ESOPHAGOGASTRODUODENOSCOPY  02/08/2006   per Dr. Abran,  gastritis    EYE SURGERY Left 12/2014   Cataract   GLAUCOMA SURGERY     with laser, per Dr. Abran    HARDWARE REMOVAL  10/30/2016   Procedure: HARDWARE REMOVAL;  Surgeon: Lynwood FORBES Better, MD;  Location: [facility];  Service: Orthopedics;;   LUMBAR FUSION  2008   POLYPECTOMY     POSTERIOR LUMBAR FUSION  10/30/2016   Removal of hardware rods and pedicle screws lumbar four, Extension of fusion L4-5 to L5-S1 with reinsertion of screws at L 4, left L5-S1 transforaminal lumbar interbody fusion, Exploration  left L5 nerve root, bilateral decompressive laminectomy L3-4/notes 10/30/2016   ULNAR NERVE TRANSPOSITION Left 05/14/2014   Procedure: Left Ulnar nerve decompression;  Surgeon: Rockey Peru, MD;  Location: [facility];  Service: Neurosurgery;  Laterality: Left;  Left Ulnar nerve decompression    Family History  Problem Relation Age of Onset   Hypertension Mother    Hypertension Sister    Hyperlipidemia Sister    Hypertension Brother    Cancer Brother        PROSTATE/LUNG   Diabetes Brother    Heart disease Brother        Before age 60 - Bypass   Varicose Veins Brother    Cancer Father        Prostate and pancreatic    Stomach cancer Maternal Grandmother 65   Heart disease Maternal Grandfather    Breast cancer Maternal Aunt 55   Cancer Maternal Aunt        Stomach   Breast cancer Cousin 1       maternal first cousin   Prostate cancer Cousin 14   Breast cancer Cousin 60   Esophageal cancer Cousin    Breast cancer Cousin 40       maternal first cousin's daughter   Coronary artery disease Neg Hx        premature CAD   Colon cancer Neg Hx    Colon polyps Neg Hx    Rectal cancer Neg Hx     Social History   Tobacco Use   Smoking status: Former    Current packs/day: 0.00    Types: Cigarettes    Quit date: 01/22/1967    Years since quitting: 57.1   Smokeless tobacco: Never  Vaping Use   Vaping status: Never Used  Substance Use Topics   Alcohol use: No    Alcohol/week: 0.0  standard drinks of alcohol   Drug use: No    No current facility-administered medications for this encounter.  Current Outpatient Medications:    Aspirin -Calcium  Carbonate 81-777 MG TABS, Take by mouth., Disp: , Rfl:    diclofenac  Sodium (VOLTAREN ) 1 % GEL, APPLY TO THE AFFECTED AREA(S) three times daily AS NEEDED FOR pain External for 30 Days,  Disp: , Rfl:    HYDROcodone -acetaminophen  (NORCO/VICODIN) 5-325 MG tablet, Take 1 tablet by mouth every 8 (eight) hours as needed., Disp: , Rfl:    insulin  aspart (NOVOLOG  PENFILL) cartridge, Inject into the skin., Disp: , Rfl:    pregabalin (LYRICA) 50 MG capsule, Take 50 mg by mouth daily., Disp: , Rfl:    traMADol  (ULTRAM ) 50 MG tablet, Oral for 5 Days, Disp: , Rfl:    valACYclovir  (VALTREX ) 500 MG tablet, Take by mouth., Disp: , Rfl:    ACCU-CHEK GUIDE test strip, 1 each by Other route in the morning and at bedtime., Disp: , Rfl:    aspirin  EC 81 MG tablet, Take 1 tablet (81 mg total) by mouth daily. Swallow whole., Disp: 90 tablet, Rfl: 3   chlorthalidone  (HYGROTON ) 25 MG tablet, Take 1 tablet (25 mg total) by mouth daily., Disp: 90 tablet, Rfl: 2   cyclobenzaprine  (FLEXERIL ) 10 MG tablet, Take 1 tablet (10 mg total) by mouth 3 (three) times daily as needed for muscle spasms., Disp: 90 tablet, Rfl: 2   ergocalciferol  (VITAMIN D2) 50000 UNITS capsule, Take 50,000 Units by mouth every Saturday. On Saturday., Disp: , Rfl:    FARXIGA 10 MG TABS tablet, Take 10 mg by mouth every morning., Disp: , Rfl:    gabapentin  (NEURONTIN ) 100 MG capsule, Take 1 capsule (100 mg total) by mouth at bedtime., Disp: 30 capsule, Rfl: 5   HUMALOG 100 UNIT/ML injection, , Disp: , Rfl:    hydrALAZINE  (APRESOLINE ) 100 MG tablet, Take 1 tablet (100 mg total) by mouth 3 (three) times daily., Disp: 270 tablet, Rfl: 2   HYDROcodone  bit-homatropine (HYCODAN) 5-1.5 MG/5ML syrup, Take 5 mLs by mouth every 4 (four) hours as needed., Disp: 240 mL, Rfl: 0   ibuprofen  (ADVIL ) 600 MG  tablet, Take 1 tablet (600 mg total) by mouth every 6 (six) hours as needed., Disp: 30 tablet, Rfl: 0   Insulin  Disposable Pump (V-GO 40) KIT, Inject 1 application into the skin See admin instructions., Disp: , Rfl:    irbesartan  (AVAPRO ) 300 MG tablet, Take 1 tablet (300 mg total) by mouth at bedtime., Disp: 90 tablet, Rfl: 3   isosorbide  mononitrate (IMDUR ) 60 MG 24 hr tablet, Please call [phone] to schedule an appointment for future refills. Thank you. 1st attempt., Disp: 45 tablet, Rfl: 0   latanoprost  (XALATAN ) 0.005 % ophthalmic solution, Place 1 drop into both eyes at bedtime. , Disp: , Rfl:    Menthol , Topical Analgesic, 4 % GEL, Apply topically., Disp: , Rfl:    methocarbamol  (ROBAXIN ) 500 MG tablet, TAKE 1 TABLET BY MOUTH EVERY 8 HOURS AS NEEDED FOR SPASM Oral for 14 Days, Disp: , Rfl:    metoprolol  succinate (TOPROL -XL) 100 MG 24 hr tablet, Take 1 tablet by mouth once daily, Disp: 90 tablet, Rfl: 3   montelukast  (SINGULAIR ) 10 MG tablet, Take 10 mg by mouth daily., Disp: , Rfl:    nitroGLYCERIN  (NITROSTAT ) 0.4 MG SL tablet, Place 1 tablet (0.4 mg total) under the tongue every 5 (five) minutes as needed for chest pain., Disp: 25 tablet, Rfl: 1   OZEMPIC, 1 MG/DOSE, 4 MG/3ML SOPN, Inject 1 mg into the skin once a week., Disp: , Rfl:    pantoprazole  (PROTONIX ) 40 MG tablet, TAKE 1 TABLET BY MOUTH ONCE DAILY IN THE MORNING . APPOINTMENT REQUIRED FOR FUTURE REFILLS, Disp: 30 tablet, Rfl: 0   rosuvastatin  (CRESTOR ) 40 MG tablet, Take 1 tablet (40 mg total) by mouth daily., Disp: 90 tablet,  Rfl: 2   spironolactone  (ALDACTONE ) 50 MG tablet, Take 1 tablet (50 mg total) by mouth daily., Disp: 90 tablet, Rfl: 1   tiZANidine (ZANAFLEX) 2 MG tablet, Oral for 30 Days, Disp: , Rfl:   Allergies  Allergen Reactions   Dulaglutide Nausea Only    Other reaction(s): made her sick  Other Reaction(s): Unknown     ROS  As noted in HPI.   Physical Exam  BP 131/66 (BP Location: Left Arm)    Pulse 74   Temp 98.2 F (36.8 C) (Oral)   Resp 18   Ht 5' 3 (1.6 m)   Wt 77.6 kg   SpO2 97%   BMI 30.30 kg/m   Constitutional: Well developed, well nourished, no acute distress Eyes:  EOMI, conjunctiva normal bilaterally HENT: Normocephalic, atraumatic,mucus membranes moist Right ear: External ear normal.  No pain with traction on pinna, palpation of tragus or mastoid.  TM obscured by cerumen.  Decreased hearing. Left ear: External ear, EAC, TM normal.  No pain with traction of the pinna, palpation of tragus or mastoid.  Decreased hearing in this ear worse than right.  No cervical lymphadenopathy. Respiratory: Normal inspiratory effort Cardiovascular: Normal rate GI: nondistended skin: No rash, skin intact Musculoskeletal: no deformities Neurologic: Alert & oriented x 3, no focal neuro deficits Psychiatric: Speech and behavior appropriate   ED Course   Medications - No data to display  Orders Placed This Encounter  Procedures   Ear wax removal    Right ear only    Standing Status:   Standing    Number of Occurrences:   1    No results found. However, due to the size of the patient record, not all encounters were searched. Please check Results Review for a complete set of results. No results found.  ED Clinical Impression  1. Hearing loss of right ear due to cerumen impaction      ED Assessment/Plan     Patient presents with hearing loss right ear most likely due to cerumen impaction.  Doubt central cause of hearing loss.  Will have right ear irrigated out and reevaluate  On reevaluation, patient states hearing has improved.  I was able to remove some white debris from the ear canal using a curette and Q-tip with improvement in hearing.  TM normal, intact.  Will have patient follow-up with [facility] if symptoms recur or persist.  Discussed MDM, treatment plan, and plan for follow-up with patient. . patient agrees with plan.   No orders of the defined types  were placed in this encounter.     *This clinic note was created using Dragon dictation software. Therefore, there may be occasional mistakes despite careful proofreading.  ?    Van Knee, MD 02/19/24 [phone]

## 2024-02-17 NOTE — ED Triage Notes (Signed)
 I haven't been able to hear out of my right ear just abruptly starting yesterday. I did have a runny nose Saturday, none since. No trauma.

## 2024-02-17 NOTE — Discharge Instructions (Signed)
 We have removed earwax from your ear.  Please follow-up with [facility] ear nose and throat if your symptoms recur or persist.

## 2024-02-19 DIAGNOSIS — N189 Chronic kidney disease, unspecified: Secondary | ICD-10-CM | POA: Diagnosis not present

## 2024-02-19 DIAGNOSIS — I129 Hypertensive chronic kidney disease with stage 1 through stage 4 chronic kidney disease, or unspecified chronic kidney disease: Secondary | ICD-10-CM | POA: Diagnosis not present

## 2024-02-19 DIAGNOSIS — N1832 Chronic kidney disease, stage 3b: Secondary | ICD-10-CM | POA: Diagnosis not present

## 2024-02-19 DIAGNOSIS — N2581 Secondary hyperparathyroidism of renal origin: Secondary | ICD-10-CM | POA: Diagnosis not present

## 2024-02-19 DIAGNOSIS — D631 Anemia in chronic kidney disease: Secondary | ICD-10-CM | POA: Diagnosis not present

## 2024-02-19 LAB — LAB REPORT - SCANNED
Creatinine, POC: 144.9 mg/dL
EGFR: 34

## 2024-02-20 ENCOUNTER — Other Ambulatory Visit: Payer: Self-pay | Admitting: Nephrology

## 2024-02-20 DIAGNOSIS — N1832 Chronic kidney disease, stage 3b: Secondary | ICD-10-CM

## 2024-02-27 DIAGNOSIS — H16223 Keratoconjunctivitis sicca, not specified as Sjogren's, bilateral: Secondary | ICD-10-CM | POA: Diagnosis not present

## 2024-02-27 DIAGNOSIS — H402233 Chronic angle-closure glaucoma, bilateral, severe stage: Secondary | ICD-10-CM | POA: Diagnosis not present

## 2024-02-27 DIAGNOSIS — H04123 Dry eye syndrome of bilateral lacrimal glands: Secondary | ICD-10-CM | POA: Diagnosis not present

## 2024-03-10 DIAGNOSIS — M19072 Primary osteoarthritis, left ankle and foot: Secondary | ICD-10-CM | POA: Diagnosis not present

## 2024-03-10 DIAGNOSIS — M792 Neuralgia and neuritis, unspecified: Secondary | ICD-10-CM | POA: Diagnosis not present

## 2024-03-10 DIAGNOSIS — M7731 Calcaneal spur, right foot: Secondary | ICD-10-CM | POA: Diagnosis not present

## 2024-03-10 DIAGNOSIS — M7751 Other enthesopathy of right foot: Secondary | ICD-10-CM | POA: Diagnosis not present

## 2024-03-10 DIAGNOSIS — M7661 Achilles tendinitis, right leg: Secondary | ICD-10-CM | POA: Diagnosis not present

## 2024-03-10 DIAGNOSIS — E1142 Type 2 diabetes mellitus with diabetic polyneuropathy: Secondary | ICD-10-CM | POA: Diagnosis not present

## 2024-03-10 DIAGNOSIS — G629 Polyneuropathy, unspecified: Secondary | ICD-10-CM | POA: Diagnosis not present

## 2024-03-10 DIAGNOSIS — M19071 Primary osteoarthritis, right ankle and foot: Secondary | ICD-10-CM | POA: Diagnosis not present

## 2024-03-16 DIAGNOSIS — M545 Low back pain, unspecified: Secondary | ICD-10-CM | POA: Diagnosis not present

## 2024-03-18 ENCOUNTER — Encounter: Payer: Self-pay | Admitting: *Deleted

## 2024-03-19 ENCOUNTER — Encounter (HOSPITAL_BASED_OUTPATIENT_CLINIC_OR_DEPARTMENT_OTHER): Payer: Self-pay | Admitting: Cardiovascular Disease

## 2024-03-19 ENCOUNTER — Ambulatory Visit (HOSPITAL_BASED_OUTPATIENT_CLINIC_OR_DEPARTMENT_OTHER): Admitting: Cardiovascular Disease

## 2024-03-19 VITALS — BP 162/52 | HR 51 | Ht 63.5 in | Wt 177.0 lb

## 2024-03-19 DIAGNOSIS — I5032 Chronic diastolic (congestive) heart failure: Secondary | ICD-10-CM

## 2024-03-19 DIAGNOSIS — I2510 Atherosclerotic heart disease of native coronary artery without angina pectoris: Secondary | ICD-10-CM

## 2024-03-19 DIAGNOSIS — E785 Hyperlipidemia, unspecified: Secondary | ICD-10-CM | POA: Diagnosis not present

## 2024-03-19 DIAGNOSIS — I10 Essential (primary) hypertension: Secondary | ICD-10-CM | POA: Diagnosis not present

## 2024-03-19 MED ORDER — IRBESARTAN 300 MG PO TABS: 300.0000 mg | ORAL_TABLET | Freq: Every day | ORAL | 3 refills | Status: AC

## 2024-03-19 MED ORDER — SPIRONOLACTONE 50 MG PO TABS: 50.0000 mg | ORAL_TABLET | Freq: Every day | ORAL | 3 refills | Status: DC

## 2024-03-19 MED ORDER — ROSUVASTATIN CALCIUM 40 MG PO TABS: 40.0000 mg | ORAL_TABLET | Freq: Every day | ORAL | 3 refills | Status: DC

## 2024-03-19 MED ORDER — ISOSORBIDE MONONITRATE ER 60 MG PO TB24: 60.0000 mg | ORAL_TABLET | Freq: Every day | ORAL | 3 refills | Status: AC

## 2024-03-19 MED ORDER — CHLORTHALIDONE 25 MG PO TABS: 25.0000 mg | ORAL_TABLET | Freq: Every day | ORAL | 3 refills | Status: DC

## 2024-03-19 MED ORDER — HYDRALAZINE HCL 100 MG PO TABS: 100.0000 mg | ORAL_TABLET | Freq: Three times a day (TID) | ORAL | 3 refills | Status: AC

## 2024-03-19 MED ORDER — METOPROLOL SUCCINATE ER 100 MG PO TB24: 100.0000 mg | ORAL_TABLET | Freq: Every day | ORAL | 3 refills | Status: AC

## 2024-03-19 NOTE — Patient Instructions (Signed)
 Medication Instructions:  Your physician recommends that you continue on your current medications as directed. Please refer to the Current Medication list given to you today.   Labwork: NONE  Testing/Procedures: NONE  Follow-Up: 1 YEAR WITH DR [place] OR CAITLIN W NP   If you need a refill on your cardiac medications before your next appointment, please call your pharmacy.
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
## 2024-03-20 ENCOUNTER — Encounter (HOSPITAL_BASED_OUTPATIENT_CLINIC_OR_DEPARTMENT_OTHER): Payer: Self-pay | Admitting: Cardiovascular Disease

## 2024-03-24 DIAGNOSIS — M25551 Pain in right hip: Secondary | ICD-10-CM | POA: Diagnosis not present

## 2024-03-25 ENCOUNTER — Other Ambulatory Visit: Payer: Self-pay | Admitting: Family Medicine

## 2024-03-25 DIAGNOSIS — K219 Gastro-esophageal reflux disease without esophagitis: Secondary | ICD-10-CM

## 2024-03-30 ENCOUNTER — Other Ambulatory Visit: Payer: Self-pay

## 2024-03-30 DIAGNOSIS — D0512 Intraductal carcinoma in situ of left breast: Secondary | ICD-10-CM

## 2024-03-31 ENCOUNTER — Inpatient Hospital Stay: Payer: PPO | Admitting: Nurse Practitioner

## 2024-03-31 ENCOUNTER — Inpatient Hospital Stay: Payer: PPO | Attending: Nurse Practitioner

## 2024-03-31 ENCOUNTER — Encounter: Payer: Self-pay | Admitting: Nurse Practitioner

## 2024-03-31 VITALS — BP 128/52 | HR 59 | Temp 97.3°F | Resp 15 | Wt 180.1 lb

## 2024-03-31 DIAGNOSIS — Z86000 Personal history of in-situ neoplasm of breast: Secondary | ICD-10-CM | POA: Diagnosis not present

## 2024-03-31 DIAGNOSIS — Z923 Personal history of irradiation: Secondary | ICD-10-CM | POA: Diagnosis not present

## 2024-03-31 DIAGNOSIS — D0512 Intraductal carcinoma in situ of left breast: Secondary | ICD-10-CM | POA: Diagnosis not present

## 2024-03-31 LAB — CMP (CANCER CENTER ONLY)
ALT: 13 U/L (ref 0–44)
AST: 16 U/L (ref 15–41)
Albumin: 4 g/dL (ref 3.5–5.0)
Alkaline Phosphatase: 75 U/L (ref 38–126)
Anion gap: 5 (ref 5–15)
BUN: 38 mg/dL — ABNORMAL HIGH (ref 8–23)
CO2: 28 mmol/L (ref 22–32)
Calcium: 9.2 mg/dL (ref 8.9–10.3)
Chloride: 111 mmol/L (ref 98–111)
Creatinine: 1.71 mg/dL — ABNORMAL HIGH (ref 0.44–1.00)
GFR, Estimated: 31 mL/min — ABNORMAL LOW (ref >60)
Glucose, Bld: 104 mg/dL — ABNORMAL HIGH (ref 70–99)
Potassium: 4.2 mmol/L (ref 3.5–5.1)
Sodium: 144 mmol/L (ref 135–145)
Total Bilirubin: 0.4 mg/dL (ref 0.0–1.2)
Total Protein: 6.6 g/dL (ref 6.5–8.1)

## 2024-03-31 LAB — CBC WITH DIFFERENTIAL (CANCER CENTER ONLY)
Abs Immature Granulocytes: 0.03 K/uL (ref 0.00–0.07)
Basophils Absolute: 0 K/uL (ref 0.0–0.1)
Basophils Relative: 0 %
Eosinophils Absolute: 0.2 K/uL (ref 0.0–0.5)
Eosinophils Relative: 2 %
HCT: 35.6 % — ABNORMAL LOW (ref 36.0–46.0)
Hemoglobin: 11.3 g/dL — ABNORMAL LOW (ref 12.0–15.0)
Immature Granulocytes: 0 %
Lymphocytes Relative: 20 %
Lymphs Abs: 1.8 K/uL (ref 0.7–4.0)
MCH: 28.4 pg (ref 26.0–34.0)
MCHC: 31.7 g/dL (ref 30.0–36.0)
MCV: 89.4 fL (ref 80.0–100.0)
Monocytes Absolute: 0.6 K/uL (ref 0.1–1.0)
Monocytes Relative: 7 %
Neutro Abs: 6.1 K/uL (ref 1.7–7.7)
Neutrophils Relative %: 71 %
Platelet Count: 219 K/uL (ref 150–400)
RBC: 3.98 MIL/uL (ref 3.87–5.11)
RDW: 16.6 % — ABNORMAL HIGH (ref 11.5–15.5)
WBC Count: 8.7 K/uL (ref 4.0–10.5)
nRBC: 0 % (ref 0.0–0.2)
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
## 2024-04-03 DIAGNOSIS — H6121 Impacted cerumen, right ear: Secondary | ICD-10-CM | POA: Diagnosis not present

## 2024-04-03 DIAGNOSIS — H9042 Sensorineural hearing loss, unilateral, left ear, with unrestricted hearing on the contralateral side: Secondary | ICD-10-CM | POA: Diagnosis not present

## 2024-04-06 NOTE — Progress Notes (Signed)
 [name]                                          MRN: [id number]   04/06/2024   The [facility] Quality Team Specialist reviewed this patient medical record for the purposes of chart review for care gap closure. The following were reviewed: chart review for care gap closure-kidney health evaluation for diabetes:eGFR  and uACR. Patient has appt 05/07/2024    [facility] Quality Team

## 2024-04-07 DIAGNOSIS — M7661 Achilles tendinitis, right leg: Secondary | ICD-10-CM | POA: Diagnosis not present

## 2024-04-07 DIAGNOSIS — M19072 Primary osteoarthritis, left ankle and foot: Secondary | ICD-10-CM | POA: Diagnosis not present

## 2024-04-07 DIAGNOSIS — I739 Peripheral vascular disease, unspecified: Secondary | ICD-10-CM | POA: Diagnosis not present

## 2024-04-07 DIAGNOSIS — G629 Polyneuropathy, unspecified: Secondary | ICD-10-CM | POA: Diagnosis not present

## 2024-04-07 DIAGNOSIS — M792 Neuralgia and neuritis, unspecified: Secondary | ICD-10-CM | POA: Diagnosis not present

## 2024-04-07 DIAGNOSIS — M7731 Calcaneal spur, right foot: Secondary | ICD-10-CM | POA: Diagnosis not present

## 2024-04-07 DIAGNOSIS — E1142 Type 2 diabetes mellitus with diabetic polyneuropathy: Secondary | ICD-10-CM | POA: Diagnosis not present

## 2024-04-07 DIAGNOSIS — M19071 Primary osteoarthritis, right ankle and foot: Secondary | ICD-10-CM | POA: Diagnosis not present

## 2024-04-14 ENCOUNTER — Ambulatory Visit
Admission: RE | Admit: 2024-04-14 | Discharge: 2024-04-14 | Disposition: A | Source: Ambulatory Visit | Attending: Nephrology | Admitting: Nephrology

## 2024-04-14 DIAGNOSIS — N1832 Chronic kidney disease, stage 3b: Secondary | ICD-10-CM

## 2024-04-14 DIAGNOSIS — N281 Cyst of kidney, acquired: Secondary | ICD-10-CM | POA: Diagnosis not present

## 2024-04-15 DIAGNOSIS — M545 Low back pain, unspecified: Secondary | ICD-10-CM | POA: Diagnosis not present

## 2024-04-21 DIAGNOSIS — H903 Sensorineural hearing loss, bilateral: Secondary | ICD-10-CM | POA: Diagnosis not present

## 2024-04-22 DIAGNOSIS — I739 Peripheral vascular disease, unspecified: Secondary | ICD-10-CM | POA: Diagnosis not present

## 2024-04-27 DIAGNOSIS — R531 Weakness: Secondary | ICD-10-CM | POA: Diagnosis not present

## 2024-04-27 DIAGNOSIS — M7061 Trochanteric bursitis, right hip: Secondary | ICD-10-CM | POA: Diagnosis not present

## 2024-04-27 DIAGNOSIS — R262 Difficulty in walking, not elsewhere classified: Secondary | ICD-10-CM | POA: Diagnosis not present

## 2024-05-05 DIAGNOSIS — R531 Weakness: Secondary | ICD-10-CM | POA: Diagnosis not present

## 2024-05-05 DIAGNOSIS — R262 Difficulty in walking, not elsewhere classified: Secondary | ICD-10-CM | POA: Diagnosis not present

## 2024-05-05 DIAGNOSIS — N1832 Chronic kidney disease, stage 3b: Secondary | ICD-10-CM | POA: Diagnosis not present

## 2024-05-05 DIAGNOSIS — M7061 Trochanteric bursitis, right hip: Secondary | ICD-10-CM | POA: Diagnosis not present

## 2024-05-07 ENCOUNTER — Ambulatory Visit: Admitting: Family Medicine

## 2024-05-07 ENCOUNTER — Encounter: Payer: Self-pay | Admitting: Family Medicine

## 2024-05-07 VITALS — BP 110/60 | HR 52 | Temp 98.1°F | Ht 63.5 in | Wt 179.0 lb

## 2024-05-07 DIAGNOSIS — N1832 Chronic kidney disease, stage 3b: Secondary | ICD-10-CM

## 2024-05-07 DIAGNOSIS — Z0000 Encounter for general adult medical examination without abnormal findings: Secondary | ICD-10-CM | POA: Diagnosis not present

## 2024-05-07 DIAGNOSIS — N183 Chronic kidney disease, stage 3 unspecified: Secondary | ICD-10-CM | POA: Insufficient documentation

## 2024-05-07 LAB — LIPID PANEL
Cholesterol: 114 mg/dL (ref 0–200)
HDL: 42.2 mg/dL (ref >39.00)
LDL Cholesterol: 48 mg/dL (ref 0–99)
NonHDL: 72.13
Total CHOL/HDL Ratio: 3
Triglycerides: 123 mg/dL (ref 0.0–149.0)
VLDL: 24.6 mg/dL (ref 0.0–40.0)

## 2024-05-07 LAB — TSH: TSH: 2.1 u[IU]/mL (ref 0.35–5.50)

## 2024-05-07 LAB — HEMOGLOBIN A1C: Hgb A1c MFr Bld: 6.4 % (ref 4.6–6.5)

## 2024-05-07 MED ORDER — GABAPENTIN 100 MG PO CAPS: 100.0000 mg | ORAL_CAPSULE | Freq: Every day | ORAL | 3 refills | Status: AC

## 2024-05-07 NOTE — Progress Notes (Signed)
 Subjective:    Patient ID: [name], female    DOB: [birth year], 78 y.o.   MRN: [id number]  HPI Here for a well exam. She has no particular complaints today. She sees Oncology regularly and thwy check labs every 90 days. I reviewed these results from last month with her, and the only significant findings were her creatinine 1.71 and GFR 31. She has seen Dr, Gordy Blanch once for the CKD, and he seemed pleased with her current medications. Her BP has been well controlled. She sees Dr. Nichole for the diabetes. She gets regular diabetic eye exams. She sees Podiatry for foot exam and they are in the process of fitting her with diabetic shoes. She sees Dr. Raford for cardiology issues.    Review of Systems  Constitutional: Negative.   HENT: Negative.    Eyes: Negative.   Respiratory: Negative.    Cardiovascular: Negative.   Gastrointestinal: Negative.   Genitourinary:  Negative for decreased urine volume, difficulty urinating, dyspareunia, dysuria, enuresis, flank pain, frequency, hematuria, pelvic pain and urgency.  Musculoskeletal:  Positive for arthralgias and back pain.  Skin: Negative.   Neurological: Negative.  Negative for headaches.  Psychiatric/Behavioral: Negative.         Objective:   Physical Exam Constitutional:      General: She is not in acute distress.    Appearance: She is well-developed. She is obese.     Comments: In a wheelchair   HENT:     Head: Normocephalic and atraumatic.     Right Ear: External ear normal.     Left Ear: External ear normal.     Nose: Nose normal.     Mouth/Throat:     Pharynx: No oropharyngeal exudate.  Eyes:     General: No scleral icterus.    Conjunctiva/sclera: Conjunctivae normal.     Pupils: Pupils are equal, round, and reactive to light.  Neck:     Thyroid : No thyromegaly.     Vascular: No JVD.  Cardiovascular:     Rate and Rhythm: Normal rate and regular rhythm.     Pulses: Normal pulses.     Heart sounds: Normal  heart sounds. No murmur heard.    No friction rub. No gallop.  Pulmonary:     Effort: Pulmonary effort is normal. No respiratory distress.     Breath sounds: Normal breath sounds. No wheezing or rales.  Chest:     Chest wall: No tenderness.  Abdominal:     General: Bowel sounds are normal. There is no distension.     Palpations: Abdomen is soft. There is no mass.     Tenderness: There is no abdominal tenderness. There is no guarding or rebound.  Musculoskeletal:        General: No tenderness. Normal range of motion.     Cervical back: Normal range of motion and neck supple.  Lymphadenopathy:     Cervical: No cervical adenopathy.  Skin:    General: Skin is warm and dry.     Findings: No erythema or rash.  Neurological:     General: No focal deficit present.     Mental Status: She is alert and oriented to person, place, and time.     Cranial Nerves: No cranial nerve deficit.     Motor: No abnormal muscle tone.     Coordination: Coordination normal.     Deep Tendon Reflexes: Reflexes are normal and symmetric. Reflexes normal.  Psychiatric:  Mood and Affect: Mood normal.        Behavior: Behavior normal.        Thought Content: Thought content normal.        Judgment: Judgment normal.           Assessment & Plan:  Well exam. We discussed diet and exercise. Get labs to check lipids, TSH, and A1c.  Garnette Olmsted, MD

## 2024-05-08 ENCOUNTER — Ambulatory Visit: Payer: Self-pay | Admitting: Family Medicine

## 2024-05-11 DIAGNOSIS — D631 Anemia in chronic kidney disease: Secondary | ICD-10-CM | POA: Diagnosis not present

## 2024-05-11 DIAGNOSIS — N1832 Chronic kidney disease, stage 3b: Secondary | ICD-10-CM | POA: Diagnosis not present

## 2024-05-11 DIAGNOSIS — I129 Hypertensive chronic kidney disease with stage 1 through stage 4 chronic kidney disease, or unspecified chronic kidney disease: Secondary | ICD-10-CM | POA: Diagnosis not present

## 2024-05-11 DIAGNOSIS — N2581 Secondary hyperparathyroidism of renal origin: Secondary | ICD-10-CM | POA: Diagnosis not present

## 2024-05-20 DIAGNOSIS — M7061 Trochanteric bursitis, right hip: Secondary | ICD-10-CM | POA: Diagnosis not present

## 2024-05-20 DIAGNOSIS — R262 Difficulty in walking, not elsewhere classified: Secondary | ICD-10-CM | POA: Diagnosis not present

## 2024-05-20 DIAGNOSIS — R531 Weakness: Secondary | ICD-10-CM | POA: Diagnosis not present

## 2024-05-21 DIAGNOSIS — R531 Weakness: Secondary | ICD-10-CM | POA: Diagnosis not present

## 2024-05-21 DIAGNOSIS — M7061 Trochanteric bursitis, right hip: Secondary | ICD-10-CM | POA: Diagnosis not present

## 2024-05-21 DIAGNOSIS — R262 Difficulty in walking, not elsewhere classified: Secondary | ICD-10-CM | POA: Diagnosis not present

## 2024-05-25 DIAGNOSIS — R262 Difficulty in walking, not elsewhere classified: Secondary | ICD-10-CM | POA: Diagnosis not present

## 2024-05-25 DIAGNOSIS — M7061 Trochanteric bursitis, right hip: Secondary | ICD-10-CM | POA: Diagnosis not present

## 2024-05-25 DIAGNOSIS — R531 Weakness: Secondary | ICD-10-CM | POA: Diagnosis not present

## 2024-06-25 ENCOUNTER — Other Ambulatory Visit: Payer: Self-pay | Admitting: Cardiovascular Disease

## 2024-07-05 ENCOUNTER — Other Ambulatory Visit: Payer: Self-pay | Admitting: Cardiovascular Disease

## 2024-07-05 ENCOUNTER — Other Ambulatory Visit: Payer: Self-pay | Admitting: Family Medicine

## 2024-07-05 DIAGNOSIS — K219 Gastro-esophageal reflux disease without esophagitis: Secondary | ICD-10-CM

## 2024-07-11 ENCOUNTER — Other Ambulatory Visit: Payer: Self-pay | Admitting: Cardiovascular Disease

## 2024-08-07 NOTE — Progress Notes (Signed)
" ° °  08/07/2024  Patient ID: [name], female   DOB: 13-May-1946, 79 y.o.   MRN: [id number]  Pharmacy Quality Measure Review  This patient is appearing on a report for being at risk of failing the adherence measure for cholesterol (statin) and hypertension (ACEi/ARB) medications this calendar year.   Medication: Rosuvastatin  Last fill date: 07/07/25 for 90 day supply  Medication: Irbesartan  Last fill date: 07/28/24 for 90 day supply  Insurance report was not up to date. No action needed at this time.   Jon VEAR Lindau, PharmD Clinical Pharmacist [phone]   "

## 2024-11-12 ENCOUNTER — Other Ambulatory Visit

## 2025-04-06 ENCOUNTER — Other Ambulatory Visit

## 2025-04-06 ENCOUNTER — Ambulatory Visit: Admitting: Nurse Practitioner
